# Patient Record
Sex: Male | Born: 1945 | Race: White | Hispanic: No | Marital: Married | Smoking: Former smoker
Health system: Southern US, Community
[De-identification: ages and names within clinical notes are randomized; demographics above are authoritative.]

## PROBLEM LIST (undated history)

## (undated) DIAGNOSIS — L97409 Non-pressure chronic ulcer of unspecified heel and midfoot with unspecified severity: Secondary | ICD-10-CM

## (undated) DIAGNOSIS — I509 Heart failure, unspecified: Secondary | ICD-10-CM

## (undated) DIAGNOSIS — N189 Chronic kidney disease, unspecified: Secondary | ICD-10-CM

## (undated) DIAGNOSIS — J449 Chronic obstructive pulmonary disease, unspecified: Secondary | ICD-10-CM

## (undated) DIAGNOSIS — M6281 Muscle weakness (generalized): Secondary | ICD-10-CM

## (undated) DIAGNOSIS — M109 Gout, unspecified: Secondary | ICD-10-CM

## (undated) DIAGNOSIS — G4730 Sleep apnea, unspecified: Secondary | ICD-10-CM

## (undated) DIAGNOSIS — I2510 Atherosclerotic heart disease of native coronary artery without angina pectoris: Secondary | ICD-10-CM

## (undated) DIAGNOSIS — E119 Type 2 diabetes mellitus without complications: Secondary | ICD-10-CM

## (undated) DIAGNOSIS — R339 Retention of urine, unspecified: Secondary | ICD-10-CM

## (undated) DIAGNOSIS — I890 Lymphedema, not elsewhere classified: Secondary | ICD-10-CM

## (undated) DIAGNOSIS — R262 Difficulty in walking, not elsewhere classified: Secondary | ICD-10-CM

## (undated) DIAGNOSIS — E785 Hyperlipidemia, unspecified: Secondary | ICD-10-CM

## (undated) DIAGNOSIS — F419 Anxiety disorder, unspecified: Secondary | ICD-10-CM

## (undated) DIAGNOSIS — R7881 Bacteremia: Secondary | ICD-10-CM

## (undated) DIAGNOSIS — I10 Essential (primary) hypertension: Secondary | ICD-10-CM

## (undated) DIAGNOSIS — J9610 Chronic respiratory failure, unspecified whether with hypoxia or hypercapnia: Secondary | ICD-10-CM

## (undated) DIAGNOSIS — R319 Hematuria, unspecified: Secondary | ICD-10-CM

## (undated) DIAGNOSIS — R32 Unspecified urinary incontinence: Secondary | ICD-10-CM

## (undated) DIAGNOSIS — K219 Gastro-esophageal reflux disease without esophagitis: Secondary | ICD-10-CM

## (undated) HISTORY — PX: CORONARY ARTERY BYPASS GRAFT: SHX141

## (undated) HISTORY — DX: Muscle weakness (generalized): M62.81

## (undated) HISTORY — PX: CORONARY ANGIOPLASTY WITH STENT PLACEMENT: SHX49

## (undated) HISTORY — DX: Bacteremia: R78.81

## (undated) HISTORY — DX: Unspecified urinary incontinence: R32

## (undated) HISTORY — DX: Heart failure, unspecified: I50.9

## (undated) HISTORY — DX: Chronic respiratory failure, unspecified whether with hypoxia or hypercapnia: J96.10

## (undated) HISTORY — DX: Lymphedema, not elsewhere classified: I89.0

## (undated) HISTORY — PX: APPENDECTOMY: SHX54

## (undated) HISTORY — DX: Morbid (severe) obesity due to excess calories: E66.01

## (undated) HISTORY — DX: Hyperlipidemia, unspecified: E78.5

## (undated) HISTORY — DX: Chronic obstructive pulmonary disease, unspecified: J44.9

## (undated) HISTORY — DX: Retention of urine, unspecified: R33.9

## (undated) HISTORY — DX: Hematuria, unspecified: R31.9

## (undated) HISTORY — DX: Anxiety disorder, unspecified: F41.9

## (undated) HISTORY — DX: Non-pressure chronic ulcer of unspecified heel and midfoot with unspecified severity: L97.409

## (undated) HISTORY — DX: Atherosclerotic heart disease of native coronary artery without angina pectoris: I25.10

## (undated) HISTORY — DX: Difficulty in walking, not elsewhere classified: R26.2

## (undated) HISTORY — DX: Sleep apnea, unspecified: G47.30

## (undated) HISTORY — PX: HERNIA REPAIR: SHX51

## (undated) HISTORY — DX: Gastro-esophageal reflux disease without esophagitis: K21.9

## (undated) HISTORY — DX: Essential (primary) hypertension: I10

---

## 2008-01-13 ENCOUNTER — Other Ambulatory Visit: Payer: Self-pay

## 2008-01-13 ENCOUNTER — Ambulatory Visit: Payer: Self-pay | Admitting: Ophthalmology

## 2008-01-26 ENCOUNTER — Encounter: Payer: Self-pay | Admitting: Family Medicine

## 2008-02-01 ENCOUNTER — Ambulatory Visit: Payer: Self-pay | Admitting: Ophthalmology

## 2008-02-10 ENCOUNTER — Encounter: Payer: Self-pay | Admitting: Family Medicine

## 2008-03-11 ENCOUNTER — Encounter: Payer: Self-pay | Admitting: Family Medicine

## 2008-04-11 ENCOUNTER — Encounter: Payer: Self-pay | Admitting: Family Medicine

## 2008-05-11 ENCOUNTER — Encounter: Payer: Self-pay | Admitting: Family Medicine

## 2011-06-05 ENCOUNTER — Ambulatory Visit: Payer: Self-pay | Admitting: Ophthalmology

## 2011-06-17 ENCOUNTER — Ambulatory Visit: Payer: Self-pay | Admitting: Ophthalmology

## 2011-07-16 ENCOUNTER — Encounter: Payer: Self-pay | Admitting: Family Medicine

## 2011-08-12 ENCOUNTER — Encounter: Payer: Self-pay | Admitting: Family Medicine

## 2011-11-02 ENCOUNTER — Emergency Department: Payer: Self-pay

## 2013-04-07 ENCOUNTER — Ambulatory Visit: Payer: Self-pay | Admitting: Podiatry

## 2014-08-30 ENCOUNTER — Inpatient Hospital Stay: Payer: Self-pay | Admitting: Internal Medicine

## 2014-08-30 DIAGNOSIS — I10 Essential (primary) hypertension: Secondary | ICD-10-CM

## 2014-08-30 DIAGNOSIS — I2510 Atherosclerotic heart disease of native coronary artery without angina pectoris: Secondary | ICD-10-CM

## 2014-08-30 DIAGNOSIS — J9600 Acute respiratory failure, unspecified whether with hypoxia or hypercapnia: Secondary | ICD-10-CM

## 2014-08-30 DIAGNOSIS — I341 Nonrheumatic mitral (valve) prolapse: Secondary | ICD-10-CM

## 2014-08-30 LAB — URINALYSIS, COMPLETE
Bilirubin,UR: NEGATIVE
Glucose,UR: NEGATIVE mg/dL (ref 0–75)
KETONE: NEGATIVE
Leukocyte Esterase: NEGATIVE
NITRITE: NEGATIVE
Ph: 6 (ref 4.5–8.0)
Protein: 30
Specific Gravity: 1.015 (ref 1.003–1.030)
WBC UR: NONE SEEN /HPF (ref 0–5)

## 2014-08-30 LAB — ED INFLUENZA
H1N1 flu by pcr: NOT DETECTED
Influenza A By PCR: NEGATIVE
Influenza B By PCR: NEGATIVE

## 2014-08-30 LAB — CBC
HCT: 42.2 % (ref 40.0–52.0)
HGB: 13 g/dL (ref 13.0–18.0)
MCH: 25.7 pg — AB (ref 26.0–34.0)
MCV: 83 fL (ref 80–100)
Platelet: 124 10*3/uL — ABNORMAL LOW (ref 150–440)
RBC: 5.07 10*6/uL (ref 4.40–5.90)
RDW: 17.4 % — AB (ref 11.5–14.5)
WBC: 13.1 10*3/uL — AB (ref 3.8–10.6)

## 2014-08-30 LAB — BASIC METABOLIC PANEL
ANION GAP: 9 (ref 7–16)
BUN: 35 mg/dL — AB (ref 7–18)
CHLORIDE: 104 mmol/L (ref 98–107)
Calcium, Total: 8.5 mg/dL (ref 8.5–10.1)
Co2: 28 mmol/L (ref 21–32)
Creatinine: 1.93 mg/dL — ABNORMAL HIGH (ref 0.60–1.30)
EGFR (African American): 45 — ABNORMAL LOW
EGFR (Non-African Amer.): 37 — ABNORMAL LOW
Glucose: 179 mg/dL — ABNORMAL HIGH (ref 65–99)
OSMOLALITY: 294 (ref 275–301)
Potassium: 4.8 mmol/L (ref 3.5–5.1)
Sodium: 141 mmol/L (ref 136–145)

## 2014-08-30 LAB — CK TOTAL AND CKMB (NOT AT ARMC)
CK, TOTAL: 78 U/L
CK, Total: 54 U/L
CK-MB: 1 ng/mL (ref 0.5–3.6)
CK-MB: 0.9 ng/mL (ref 0.5–3.6)

## 2014-08-30 LAB — TROPONIN I
Troponin-I: 0.03 ng/mL
Troponin-I: 0.13 ng/mL — ABNORMAL HIGH

## 2014-08-30 LAB — PRO B NATRIURETIC PEPTIDE: B-TYPE NATIURETIC PEPTID: 2540 pg/mL — AB (ref 0–125)

## 2014-08-31 LAB — BASIC METABOLIC PANEL
ANION GAP: 3 — AB (ref 7–16)
Anion Gap: 7 (ref 7–16)
BUN: 44 mg/dL — ABNORMAL HIGH (ref 7–18)
BUN: 48 mg/dL — ABNORMAL HIGH (ref 7–18)
CALCIUM: 8.3 mg/dL — AB (ref 8.5–10.1)
CALCIUM: 8.1 mg/dL — AB (ref 8.5–10.1)
CHLORIDE: 103 mmol/L (ref 98–107)
CO2: 33 mmol/L — AB (ref 21–32)
CREATININE: 2.51 mg/dL — AB (ref 0.60–1.30)
Chloride: 100 mmol/L (ref 98–107)
Co2: 32 mmol/L (ref 21–32)
Creatinine: 2.58 mg/dL — ABNORMAL HIGH (ref 0.60–1.30)
EGFR (African American): 33 — ABNORMAL LOW
EGFR (Non-African Amer.): 27 — ABNORMAL LOW
GFR CALC AF AMER: 32 — AB
GFR CALC NON AF AMER: 26 — AB
Glucose: 124 mg/dL — ABNORMAL HIGH (ref 65–99)
Glucose: 143 mg/dL — ABNORMAL HIGH (ref 65–99)
Osmolality: 290 (ref 275–301)
Osmolality: 293 (ref 275–301)
POTASSIUM: 5.3 mmol/L — AB (ref 3.5–5.1)
POTASSIUM: 5.2 mmol/L — AB (ref 3.5–5.1)
Sodium: 139 mmol/L (ref 136–145)
Sodium: 139 mmol/L (ref 136–145)

## 2014-09-01 LAB — BASIC METABOLIC PANEL
Anion Gap: 5 — ABNORMAL LOW (ref 7–16)
BUN: 58 mg/dL — ABNORMAL HIGH (ref 7–18)
Calcium, Total: 8.3 mg/dL — ABNORMAL LOW (ref 8.5–10.1)
Chloride: 99 mmol/L (ref 98–107)
Co2: 34 mmol/L — ABNORMAL HIGH (ref 21–32)
Creatinine: 2.97 mg/dL — ABNORMAL HIGH (ref 0.60–1.30)
EGFR (African American): 27 — ABNORMAL LOW
EGFR (Non-African Amer.): 22 — ABNORMAL LOW
GLUCOSE: 101 mg/dL — AB (ref 65–99)
OSMOLALITY: 292 (ref 275–301)
Potassium: 5.1 mmol/L (ref 3.5–5.1)
Sodium: 138 mmol/L (ref 136–145)

## 2014-09-02 LAB — BASIC METABOLIC PANEL
ANION GAP: 6 — AB (ref 7–16)
BUN: 56 mg/dL — AB (ref 7–18)
CO2: 30 mmol/L (ref 21–32)
Calcium, Total: 8.5 mg/dL (ref 8.5–10.1)
Chloride: 100 mmol/L (ref 98–107)
Creatinine: 2.3 mg/dL — ABNORMAL HIGH (ref 0.60–1.30)
EGFR (African American): 36 — ABNORMAL LOW
EGFR (Non-African Amer.): 30 — ABNORMAL LOW
Glucose: 112 mg/dL — ABNORMAL HIGH (ref 65–99)
Osmolality: 288 (ref 275–301)
POTASSIUM: 4.7 mmol/L (ref 3.5–5.1)
Sodium: 136 mmol/L (ref 136–145)

## 2014-09-04 LAB — BASIC METABOLIC PANEL
ANION GAP: 8 (ref 7–16)
BUN: 51 mg/dL — ABNORMAL HIGH (ref 7–18)
CALCIUM: 9.2 mg/dL (ref 8.5–10.1)
CREATININE: 1.56 mg/dL — AB (ref 0.60–1.30)
Chloride: 103 mmol/L (ref 98–107)
Co2: 31 mmol/L (ref 21–32)
EGFR (African American): 57 — ABNORMAL LOW
GFR CALC NON AF AMER: 47 — AB
Glucose: 258 mg/dL — ABNORMAL HIGH (ref 65–99)
Osmolality: 306 (ref 275–301)
Potassium: 4.5 mmol/L (ref 3.5–5.1)
SODIUM: 142 mmol/L (ref 136–145)

## 2014-09-04 LAB — CLOSTRIDIUM DIFFICILE(ARMC)

## 2014-09-04 LAB — CULTURE, BLOOD (SINGLE)

## 2014-09-05 LAB — BASIC METABOLIC PANEL
Anion Gap: 5 — ABNORMAL LOW (ref 7–16)
BUN: 51 mg/dL — AB (ref 7–18)
Calcium, Total: 9.1 mg/dL (ref 8.5–10.1)
Chloride: 101 mmol/L (ref 98–107)
Co2: 34 mmol/L — ABNORMAL HIGH (ref 21–32)
Creatinine: 1.61 mg/dL — ABNORMAL HIGH (ref 0.60–1.30)
EGFR (African American): 55 — ABNORMAL LOW
GFR CALC NON AF AMER: 45 — AB
GLUCOSE: 211 mg/dL — AB (ref 65–99)
Osmolality: 299 (ref 275–301)
Potassium: 4.6 mmol/L (ref 3.5–5.1)
Sodium: 140 mmol/L (ref 136–145)

## 2014-09-06 LAB — CULTURE, BLOOD (SINGLE)

## 2014-10-11 ENCOUNTER — Emergency Department: Payer: Self-pay | Admitting: Internal Medicine

## 2014-10-11 LAB — COMPREHENSIVE METABOLIC PANEL
ALT: 10 U/L — AB
Albumin: 3.2 g/dL — ABNORMAL LOW (ref 3.4–5.0)
Alkaline Phosphatase: 76 U/L
Anion Gap: 4 — ABNORMAL LOW (ref 7–16)
BUN: 38 mg/dL — ABNORMAL HIGH (ref 7–18)
Bilirubin,Total: 0.4 mg/dL (ref 0.2–1.0)
Calcium, Total: 8.8 mg/dL (ref 8.5–10.1)
Chloride: 103 mmol/L (ref 98–107)
Co2: 33 mmol/L — ABNORMAL HIGH (ref 21–32)
Creatinine: 1.99 mg/dL — ABNORMAL HIGH (ref 0.60–1.30)
GFR CALC AF AMER: 43 — AB
GFR CALC NON AF AMER: 36 — AB
Glucose: 60 mg/dL — ABNORMAL LOW (ref 65–99)
OSMOLALITY: 286 (ref 275–301)
Potassium: 4.7 mmol/L (ref 3.5–5.1)
SGOT(AST): 29 U/L (ref 15–37)
SODIUM: 140 mmol/L (ref 136–145)
TOTAL PROTEIN: 6.4 g/dL (ref 6.4–8.2)

## 2014-10-11 LAB — CK TOTAL AND CKMB (NOT AT ARMC)
CK, TOTAL: 35 U/L — AB (ref 39–308)
CK-MB: 1.7 ng/mL (ref 0.5–3.6)

## 2014-10-11 LAB — PROTIME-INR
INR: 1.2
PROTHROMBIN TIME: 14.6 s (ref 11.5–14.7)

## 2014-10-11 LAB — TROPONIN I: Troponin-I: 0.02 ng/mL

## 2014-10-11 LAB — CBC
HCT: 38.7 % — ABNORMAL LOW (ref 40.0–52.0)
HGB: 12.1 g/dL — AB (ref 13.0–18.0)
MCH: 26.9 pg (ref 26.0–34.0)
MCHC: 31.4 g/dL — AB (ref 32.0–36.0)
MCV: 86 fL (ref 80–100)
Platelet: 131 10*3/uL — ABNORMAL LOW (ref 150–440)
RBC: 4.51 10*6/uL (ref 4.40–5.90)
RDW: 19.5 % — AB (ref 11.5–14.5)
WBC: 5.5 10*3/uL (ref 3.8–10.6)

## 2014-10-11 LAB — AMMONIA: AMMONIA, PLASMA: 44 umol/L — AB (ref 11–32)

## 2014-10-15 ENCOUNTER — Emergency Department: Payer: Self-pay | Admitting: Student

## 2014-10-15 LAB — COMPREHENSIVE METABOLIC PANEL
ALBUMIN: 3.2 g/dL — AB (ref 3.4–5.0)
ALT: 22 U/L
ANION GAP: 10 (ref 7–16)
Alkaline Phosphatase: 81 U/L
BILIRUBIN TOTAL: 0.8 mg/dL (ref 0.2–1.0)
BUN: 41 mg/dL — ABNORMAL HIGH (ref 7–18)
CALCIUM: 8.7 mg/dL (ref 8.5–10.1)
CO2: 32 mmol/L (ref 21–32)
Chloride: 99 mmol/L (ref 98–107)
Creatinine: 2.15 mg/dL — ABNORMAL HIGH (ref 0.60–1.30)
EGFR (African American): 39 — ABNORMAL LOW
EGFR (Non-African Amer.): 33 — ABNORMAL LOW
GLUCOSE: 96 mg/dL (ref 65–99)
OSMOLALITY: 291 (ref 275–301)
POTASSIUM: 4.2 mmol/L (ref 3.5–5.1)
SGOT(AST): 21 U/L (ref 15–37)
SODIUM: 141 mmol/L (ref 136–145)
Total Protein: 6.9 g/dL (ref 6.4–8.2)

## 2014-10-15 LAB — CBC
HCT: 36.6 % — ABNORMAL LOW (ref 40.0–52.0)
HGB: 11.6 g/dL — ABNORMAL LOW (ref 13.0–18.0)
MCH: 27.2 pg (ref 26.0–34.0)
MCHC: 31.8 g/dL — AB (ref 32.0–36.0)
MCV: 86 fL (ref 80–100)
Platelet: 123 10*3/uL — ABNORMAL LOW (ref 150–440)
RBC: 4.27 10*6/uL — ABNORMAL LOW (ref 4.40–5.90)
RDW: 19.9 % — ABNORMAL HIGH (ref 11.5–14.5)
WBC: 10.2 10*3/uL (ref 3.8–10.6)

## 2014-10-15 LAB — TROPONIN I: TROPONIN-I: 0.02 ng/mL

## 2014-10-15 LAB — URINALYSIS, COMPLETE
Bilirubin,UR: NEGATIVE
GLUCOSE, UR: NEGATIVE mg/dL (ref 0–75)
Hyaline Cast: 9
KETONE: NEGATIVE
NITRITE: NEGATIVE
Ph: 5 (ref 4.5–8.0)
Protein: 30
RBC,UR: 22 /HPF (ref 0–5)
Specific Gravity: 1.015 (ref 1.003–1.030)
WBC UR: 54 /HPF (ref 0–5)

## 2014-10-15 LAB — AMMONIA: Ammonia, Plasma: 19 mcmol/L (ref 11–32)

## 2014-10-18 LAB — URINE CULTURE

## 2014-11-11 ENCOUNTER — Ambulatory Visit: Payer: Self-pay | Admitting: Hospice and Palliative Medicine

## 2014-11-12 ENCOUNTER — Ambulatory Visit: Payer: Self-pay

## 2014-11-12 LAB — URINALYSIS, COMPLETE
Bilirubin,UR: NEGATIVE
Glucose,UR: NEGATIVE mg/dL
Hyaline Cast: 42
Ketone: NEGATIVE
Nitrite: NEGATIVE
Ph: 5
Protein: 30
RBC,UR: 15 /HPF
Specific Gravity: 1.01
Squamous Epithelial: 1
WBC UR: 928 /HPF

## 2014-11-13 ENCOUNTER — Inpatient Hospital Stay: Payer: Self-pay | Admitting: Internal Medicine

## 2014-11-13 LAB — CBC WITH DIFFERENTIAL/PLATELET
BASOS ABS: 0.1 10*3/uL (ref 0.0–0.1)
BASOS PCT: 0.8 %
Eosinophil #: 0 10*3/uL (ref 0.0–0.7)
Eosinophil %: 0 %
HCT: 36.9 % — AB (ref 40.0–52.0)
HGB: 11.8 g/dL — AB (ref 13.0–18.0)
Lymphocyte #: 0.1 10*3/uL — ABNORMAL LOW (ref 1.0–3.6)
Lymphocyte %: 1.6 %
MCH: 28.2 pg (ref 26.0–34.0)
MCHC: 31.9 g/dL — ABNORMAL LOW (ref 32.0–36.0)
MCV: 88 fL (ref 80–100)
MONOS PCT: 4.1 %
Monocyte #: 0.3 x10 3/mm (ref 0.2–1.0)
NEUTROS ABS: 7.8 10*3/uL — AB (ref 1.4–6.5)
Neutrophil %: 93.5 %
Platelet: 111 10*3/uL — ABNORMAL LOW (ref 150–440)
RBC: 4.18 10*6/uL — ABNORMAL LOW (ref 4.40–5.90)
RDW: 17.9 % — AB (ref 11.5–14.5)
WBC: 8.4 10*3/uL (ref 3.8–10.6)

## 2014-11-13 LAB — COMPREHENSIVE METABOLIC PANEL
ALBUMIN: 3.1 g/dL — AB (ref 3.4–5.0)
AST: 24 U/L (ref 15–37)
Alkaline Phosphatase: 75 U/L
Anion Gap: 8 (ref 7–16)
BUN: 66 mg/dL — ABNORMAL HIGH (ref 7–18)
Bilirubin,Total: 1.2 mg/dL — ABNORMAL HIGH (ref 0.2–1.0)
Calcium, Total: 9.2 mg/dL (ref 8.5–10.1)
Chloride: 103 mmol/L (ref 98–107)
Co2: 33 mmol/L — ABNORMAL HIGH (ref 21–32)
Creatinine: 2.7 mg/dL — ABNORMAL HIGH (ref 0.60–1.30)
EGFR (Non-African Amer.): 25 — ABNORMAL LOW
GFR CALC AF AMER: 30 — AB
GLUCOSE: 138 mg/dL — AB (ref 65–99)
OSMOLALITY: 308 (ref 275–301)
POTASSIUM: 3.6 mmol/L (ref 3.5–5.1)
SGPT (ALT): 15 U/L
Sodium: 144 mmol/L (ref 136–145)
Total Protein: 6.5 g/dL (ref 6.4–8.2)

## 2014-11-13 LAB — TROPONIN I
Troponin-I: 0.09 ng/mL — ABNORMAL HIGH
Troponin-I: 0.09 ng/mL — ABNORMAL HIGH
Troponin-I: 0.1 ng/mL — ABNORMAL HIGH

## 2014-11-13 LAB — MAGNESIUM: MAGNESIUM: 1.6 mg/dL — AB

## 2014-11-13 LAB — PROTIME-INR
INR: 1.2
PROTHROMBIN TIME: 14.9 s — AB (ref 11.5–14.7)

## 2014-11-13 LAB — PHOSPHORUS: PHOSPHORUS: 2.4 mg/dL — AB (ref 2.5–4.9)

## 2014-11-14 LAB — CBC WITH DIFFERENTIAL/PLATELET
BASOS PCT: 0.4 %
Basophil #: 0 10*3/uL (ref 0.0–0.1)
EOS PCT: 0.6 %
Eosinophil #: 0 10*3/uL (ref 0.0–0.7)
HCT: 32.9 % — AB (ref 40.0–52.0)
HGB: 10.6 g/dL — AB (ref 13.0–18.0)
LYMPHS PCT: 11.4 %
Lymphocyte #: 0.4 10*3/uL — ABNORMAL LOW (ref 1.0–3.6)
MCH: 28.4 pg (ref 26.0–34.0)
MCHC: 32.2 g/dL (ref 32.0–36.0)
MCV: 88 fL (ref 80–100)
Monocyte #: 0.4 x10 3/mm (ref 0.2–1.0)
Monocyte %: 10.5 %
NEUTROS ABS: 2.9 10*3/uL (ref 1.4–6.5)
Neutrophil %: 77.1 %
Platelet: 84 10*3/uL — ABNORMAL LOW (ref 150–440)
RBC: 3.74 10*6/uL — AB (ref 4.40–5.90)
RDW: 17.7 % — ABNORMAL HIGH (ref 11.5–14.5)
WBC: 3.8 10*3/uL (ref 3.8–10.6)

## 2014-11-14 LAB — BASIC METABOLIC PANEL
Anion Gap: 7 (ref 7–16)
BUN: 59 mg/dL — ABNORMAL HIGH (ref 7–18)
CALCIUM: 8.6 mg/dL (ref 8.5–10.1)
CO2: 33 mmol/L — AB (ref 21–32)
Chloride: 106 mmol/L (ref 98–107)
Creatinine: 2.26 mg/dL — ABNORMAL HIGH (ref 0.60–1.30)
EGFR (Non-African Amer.): 31 — ABNORMAL LOW
GFR CALC AF AMER: 37 — AB
GLUCOSE: 118 mg/dL — AB (ref 65–99)
OSMOLALITY: 308 (ref 275–301)
Potassium: 3.6 mmol/L (ref 3.5–5.1)
SODIUM: 146 mmol/L — AB (ref 136–145)

## 2014-11-14 LAB — URINE CULTURE

## 2014-11-14 LAB — MAGNESIUM: Magnesium: 1.9 mg/dL

## 2014-11-15 LAB — CBC WITH DIFFERENTIAL/PLATELET
BASOS PCT: 0.7 %
Basophil #: 0 10*3/uL (ref 0.0–0.1)
EOS PCT: 3.2 %
Eosinophil #: 0.1 10*3/uL (ref 0.0–0.7)
HCT: 30.5 % — ABNORMAL LOW (ref 40.0–52.0)
HGB: 10 g/dL — AB (ref 13.0–18.0)
Lymphocyte #: 0.5 10*3/uL — ABNORMAL LOW (ref 1.0–3.6)
Lymphocyte %: 17 %
MCH: 28.3 pg (ref 26.0–34.0)
MCHC: 32.8 g/dL (ref 32.0–36.0)
MCV: 86 fL (ref 80–100)
MONO ABS: 0.4 x10 3/mm (ref 0.2–1.0)
Monocyte %: 11.6 %
NEUTROS PCT: 67.5 %
Neutrophil #: 2.1 10*3/uL (ref 1.4–6.5)
Platelet: 88 10*3/uL — ABNORMAL LOW (ref 150–440)
RBC: 3.55 10*6/uL — AB (ref 4.40–5.90)
RDW: 17.6 % — ABNORMAL HIGH (ref 11.5–14.5)
WBC: 3.1 10*3/uL — AB (ref 3.8–10.6)

## 2014-11-15 LAB — BASIC METABOLIC PANEL
Anion Gap: 6 — ABNORMAL LOW (ref 7–16)
BUN: 45 mg/dL — AB (ref 7–18)
CHLORIDE: 106 mmol/L (ref 98–107)
CREATININE: 1.82 mg/dL — AB (ref 0.60–1.30)
Calcium, Total: 8.3 mg/dL — ABNORMAL LOW (ref 8.5–10.1)
Co2: 34 mmol/L — ABNORMAL HIGH (ref 21–32)
EGFR (African American): 48 — ABNORMAL LOW
GFR CALC NON AF AMER: 39 — AB
GLUCOSE: 105 mg/dL — AB (ref 65–99)
OSMOLALITY: 302 (ref 275–301)
POTASSIUM: 3.3 mmol/L — AB (ref 3.5–5.1)
Sodium: 146 mmol/L — ABNORMAL HIGH (ref 136–145)

## 2014-11-16 LAB — BASIC METABOLIC PANEL
Anion Gap: 4 — ABNORMAL LOW (ref 7–16)
BUN: 30 mg/dL — AB (ref 7–18)
Calcium, Total: 8.5 mg/dL (ref 8.5–10.1)
Chloride: 106 mmol/L (ref 98–107)
Co2: 36 mmol/L — ABNORMAL HIGH (ref 21–32)
Creatinine: 1.36 mg/dL — ABNORMAL HIGH (ref 0.60–1.30)
GFR CALC NON AF AMER: 55 — AB
Glucose: 138 mg/dL — ABNORMAL HIGH (ref 65–99)
Osmolality: 299 (ref 275–301)
POTASSIUM: 3.9 mmol/L (ref 3.5–5.1)
Sodium: 146 mmol/L — ABNORMAL HIGH (ref 136–145)

## 2014-11-17 LAB — CLOSTRIDIUM DIFFICILE(ARMC)

## 2014-11-17 LAB — CULTURE, BLOOD (SINGLE)

## 2014-11-23 ENCOUNTER — Inpatient Hospital Stay: Payer: Self-pay | Admitting: Internal Medicine

## 2014-11-23 LAB — URINALYSIS, COMPLETE
BILIRUBIN, UR: NEGATIVE
Bacteria: NONE SEEN
Glucose,UR: NEGATIVE mg/dL (ref 0–75)
KETONE: NEGATIVE
LEUKOCYTE ESTERASE: NEGATIVE
Nitrite: NEGATIVE
PROTEIN: NEGATIVE
Ph: 5 (ref 4.5–8.0)
Specific Gravity: 1.01 (ref 1.003–1.030)
Squamous Epithelial: 1
WBC UR: 1 /HPF (ref 0–5)

## 2014-11-23 LAB — CBC WITH DIFFERENTIAL/PLATELET
Basophil #: 0.1 10*3/uL (ref 0.0–0.1)
Basophil %: 0.3 %
EOS PCT: 0.1 %
Eosinophil #: 0 10*3/uL (ref 0.0–0.7)
HCT: 34.5 % — AB (ref 40.0–52.0)
HGB: 11.2 g/dL — AB (ref 13.0–18.0)
LYMPHS ABS: 0.5 10*3/uL — AB (ref 1.0–3.6)
Lymphocyte %: 2.5 %
MCH: 28.3 pg (ref 26.0–34.0)
MCHC: 32.5 g/dL (ref 32.0–36.0)
MCV: 87 fL (ref 80–100)
MONOS PCT: 3 %
Monocyte #: 0.6 x10 3/mm (ref 0.2–1.0)
NEUTROS ABS: 17.4 10*3/uL — AB (ref 1.4–6.5)
Neutrophil %: 94.1 %
PLATELETS: 161 10*3/uL (ref 150–440)
RBC: 3.96 10*6/uL — ABNORMAL LOW (ref 4.40–5.90)
RDW: 17.5 % — ABNORMAL HIGH (ref 11.5–14.5)
WBC: 18.5 10*3/uL — ABNORMAL HIGH (ref 3.8–10.6)

## 2014-11-23 LAB — COMPREHENSIVE METABOLIC PANEL
Albumin: 2.5 g/dL — ABNORMAL LOW (ref 3.4–5.0)
Alkaline Phosphatase: 72 U/L
Anion Gap: 7 (ref 7–16)
BUN: 27 mg/dL — AB (ref 7–18)
Bilirubin,Total: 0.8 mg/dL (ref 0.2–1.0)
CREATININE: 2.01 mg/dL — AB (ref 0.60–1.30)
Calcium, Total: 8.3 mg/dL — ABNORMAL LOW (ref 8.5–10.1)
Chloride: 100 mmol/L (ref 98–107)
Co2: 33 mmol/L — ABNORMAL HIGH (ref 21–32)
GFR CALC AF AMER: 43 — AB
GFR CALC NON AF AMER: 35 — AB
GLUCOSE: 112 mg/dL — AB (ref 65–99)
Osmolality: 285 (ref 275–301)
Potassium: 3.8 mmol/L (ref 3.5–5.1)
SGOT(AST): 25 U/L (ref 15–37)
SGPT (ALT): 16 U/L
Sodium: 140 mmol/L (ref 136–145)
Total Protein: 5.5 g/dL — ABNORMAL LOW (ref 6.4–8.2)

## 2014-11-23 LAB — TROPONIN I
TROPONIN-I: 0.04 ng/mL
Troponin-I: 0.04 ng/mL

## 2014-11-23 LAB — MAGNESIUM: Magnesium: 1.6 mg/dL — ABNORMAL LOW

## 2014-11-23 LAB — PHOSPHORUS: PHOSPHORUS: 3.1 mg/dL (ref 2.5–4.9)

## 2014-11-23 LAB — PROTIME-INR
INR: 1.2
Prothrombin Time: 14.9 secs — ABNORMAL HIGH (ref 11.5–14.7)

## 2014-11-23 LAB — CK: CK, TOTAL: 26 U/L — AB (ref 39–308)

## 2014-11-24 LAB — CK: CK, TOTAL: 18 U/L — AB (ref 39–308)

## 2014-11-24 LAB — BASIC METABOLIC PANEL
Anion Gap: 5 — ABNORMAL LOW (ref 7–16)
BUN: 24 mg/dL — AB (ref 7–18)
CALCIUM: 8.1 mg/dL — AB (ref 8.5–10.1)
Chloride: 104 mmol/L (ref 98–107)
Co2: 33 mmol/L — ABNORMAL HIGH (ref 21–32)
Creatinine: 1.78 mg/dL — ABNORMAL HIGH (ref 0.60–1.30)
EGFR (African American): 49 — ABNORMAL LOW
GFR CALC NON AF AMER: 40 — AB
GLUCOSE: 129 mg/dL — AB (ref 65–99)
OSMOLALITY: 289 (ref 275–301)
Potassium: 3.7 mmol/L (ref 3.5–5.1)
Sodium: 142 mmol/L (ref 136–145)

## 2014-11-24 LAB — CBC WITH DIFFERENTIAL/PLATELET
BASOS PCT: 1.2 %
Basophil #: 0 10*3/uL (ref 0.0–0.1)
Basophil #: 0.1 10*3/uL (ref 0.0–0.1)
Basophil %: 0.3 %
EOS ABS: 0 10*3/uL (ref 0.0–0.7)
Eosinophil #: 0.1 10*3/uL (ref 0.0–0.7)
Eosinophil %: 0.4 %
Eosinophil %: 0.7 %
HCT: 27.3 % — AB (ref 40.0–52.0)
HCT: 27.7 % — AB (ref 40.0–52.0)
HGB: 8.8 g/dL — ABNORMAL LOW (ref 13.0–18.0)
HGB: 9 g/dL — ABNORMAL LOW (ref 13.0–18.0)
LYMPHS ABS: 0.4 10*3/uL — AB (ref 1.0–3.6)
LYMPHS PCT: 6.6 %
LYMPHS PCT: 3.8 %
Lymphocyte #: 0.5 10*3/uL — ABNORMAL LOW (ref 1.0–3.6)
MCH: 28.6 pg (ref 26.0–34.0)
MCH: 28.8 pg (ref 26.0–34.0)
MCHC: 32.2 g/dL (ref 32.0–36.0)
MCHC: 32.3 g/dL (ref 32.0–36.0)
MCV: 89 fL (ref 80–100)
MCV: 89 fL (ref 80–100)
MONO ABS: 0.3 x10 3/mm (ref 0.2–1.0)
Monocyte #: 0.3 x10 3/mm (ref 0.2–1.0)
Monocyte %: 3.1 %
Monocyte %: 4 %
NEUTROS ABS: 6.8 10*3/uL — AB (ref 1.4–6.5)
NEUTROS PCT: 91.5 %
Neutrophil #: 9.9 10*3/uL — ABNORMAL HIGH (ref 1.4–6.5)
Neutrophil %: 88.4 %
PLATELETS: 104 10*3/uL — AB (ref 150–440)
Platelet: 116 10*3/uL — ABNORMAL LOW (ref 150–440)
RBC: 3.07 10*6/uL — ABNORMAL LOW (ref 4.40–5.90)
RBC: 3.11 10*6/uL — ABNORMAL LOW (ref 4.40–5.90)
RDW: 17.1 % — AB (ref 11.5–14.5)
RDW: 17.5 % — ABNORMAL HIGH (ref 11.5–14.5)
WBC: 7.7 10*3/uL (ref 3.8–10.6)
WBC: 10.8 10*3/uL — AB (ref 3.8–10.6)

## 2014-11-24 LAB — TROPONIN I: Troponin-I: 0.04 ng/mL

## 2014-11-25 LAB — URINE CULTURE

## 2014-11-25 LAB — VANCOMYCIN, RANDOM: VANCOMYCIN, RANDOM: 23 ug/mL

## 2014-11-25 LAB — MAGNESIUM: MAGNESIUM: 1.7 mg/dL — AB

## 2014-11-26 LAB — BASIC METABOLIC PANEL
Anion Gap: 5 — ABNORMAL LOW (ref 7–16)
BUN: 13 mg/dL (ref 7–18)
CALCIUM: 8.6 mg/dL (ref 8.5–10.1)
CO2: 33 mmol/L — AB (ref 21–32)
Chloride: 106 mmol/L (ref 98–107)
Creatinine: 1.08 mg/dL (ref 0.60–1.30)
EGFR (African American): >60
EGFR (Non-African Amer.): >60
GLUCOSE: 108 mg/dL — AB (ref 65–99)
OSMOLALITY: 287 (ref 275–301)
POTASSIUM: 3.8 mmol/L (ref 3.5–5.1)
Sodium: 144 mmol/L (ref 136–145)

## 2014-11-26 LAB — CBC WITH DIFFERENTIAL/PLATELET
BASOS ABS: 0 10*3/uL (ref 0.0–0.1)
Basophil %: 0.8 %
EOS ABS: 0.1 10*3/uL (ref 0.0–0.7)
Eosinophil %: 2.3 %
HCT: 29.3 % — AB (ref 40.0–52.0)
HGB: 9.4 g/dL — ABNORMAL LOW (ref 13.0–18.0)
LYMPHS PCT: 12.4 %
Lymphocyte #: 0.6 10*3/uL — ABNORMAL LOW (ref 1.0–3.6)
MCH: 28.9 pg (ref 26.0–34.0)
MCHC: 32.1 g/dL (ref 32.0–36.0)
MCV: 90 fL (ref 80–100)
MONO ABS: 0.3 x10 3/mm (ref 0.2–1.0)
Monocyte %: 5.2 %
NEUTROS ABS: 4.1 10*3/uL (ref 1.4–6.5)
Neutrophil %: 79.3 %
Platelet: 116 10*3/uL — ABNORMAL LOW (ref 150–440)
RBC: 3.26 10*6/uL — ABNORMAL LOW (ref 4.40–5.90)
RDW: 17 % — ABNORMAL HIGH (ref 11.5–14.5)
WBC: 5.2 10*3/uL (ref 3.8–10.6)

## 2014-11-28 LAB — CULTURE, BLOOD (SINGLE)

## 2014-12-12 ENCOUNTER — Ambulatory Visit: Payer: Self-pay | Admitting: Hospice and Palliative Medicine

## 2015-01-02 ENCOUNTER — Ambulatory Visit: Payer: Self-pay | Admitting: Urology

## 2015-03-04 NOTE — H&P (Signed)
PATIENT NAME:  [name], [name] MR#:  [id number] DATE OF BIRTH:  [birth year]  DATE OF ADMISSION:  08/30/2014  PR[facility] C[facility] PH[facility]IC[facility] At [facility] [place].   [facility] [place].   REFERRING EMERGENCY ROOM PHYSICIAN: Lurena JoinerRebecca L. Lord, MD  CHIEF COMPLAINT: Shortness of breath and fever.   HISTORY OF PRESENT ILLNESS: A 69 year old male with past history of hypertension, diabetes, coronary artery disease and CABG with sleep apnea and CPAP requirement at night and morbid obesity, and chronic oxygen use at home saying that until yesterday he was feeling fine. Yesterday evening he started feeling some shortness of breath and he had some chills and in the night he woke up with severe chills and feeling cold and was feeling extremely short of breath, so came to Emergency Room. In the ER, he was noted to be hypoxic so workup for that was done which showed some congestion on chest x-ray according to me and was started on BiPAP for respiratory support. He also noted to be having some fever and elevated white cell count. He had bilateral lower extremity chronic swelling and pain and lymphedema and it appeared a little red, so given to hospitalist service for further management of his respiratory failure and fever.   REVIEW OF SYSTEMS: CONSTITUTIONAL: Negative for fever. Positive of fatigue, generalized weakness and chills. No weight gain or weight loss.  EYES: No blurring, double vision, discharge or redness.  EARS, NOSE, THROAT: No tinnitus, ear pain or hearing loss.  RESPIRATORY: The patient had some shortness of breath, but denied any cough or chest pain.  CARDIOVASCULAR: No palpitations, but had chronic edema on the legs. No chest pain.  GASTROINTESTINAL: No nausea, vomiting, diarrhea, abdominal pain.  GENITOURINARY: No dysuria, hematuria, increased frequency.  ENDOCRINE: No heat or cold intolerance. No excessive sweating.  NEUROLOGICAL: No numbness, weakness, tremor or vertigo.   PSYCHIATRIC: No anxiety, insomnia, bipolar disorder.   PAST MEDICAL HISTORY: 1.  Hypertension.  2.  Diabetes mellitus.   3.  Coronary artery disease and status post CABG.  4.  Hyperlipidemia. 5.  Obesity hypoventilation syndrome and chronic sleep apnea. Uses CPAP at night and oxygen throughout the day.  6.  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   PAST SURGICAL HISTORY: CABG.   SOCIAL HISTORY: Lives with his wife who is wheelchair-bound and disabled. He was a smoker in the very remote past, but was not a very heavy habitual smoker. Denies alcohol or illegal drug use.   FAMILY HISTORY: Positive for diabetes in multiple family members and leukemia in his brother, coronary artery disease in his mother.   HOME MEDICATIONS:  1.  Simvastatin 40 mg oral once a day.  2.  Sertraline 100 mg oral 2 tablets once a day.  3.  Plavix 75 mg oral once a day.  4.  Oxycodone 5 mg oral 2 tablets 3 times a day.  5.  Omeprazole 20 mg oral 1 tablet 2 times a day.  6.  Novolin 70/30 with 40 units subcutaneous once in the morning and 15 units subcutaneous at bedtime.  7.  Nitrostat 0.4 mg sublingual tablet every 5 minutes as needed for chest pain.  8.  Metoprolol 100 mg oral tablet once a day.  9.  Isosorbide mononitrate 60 mg oral once a day.  10.   Gabapentin 800 mg oral 2 times a day.  11.   Furosemide 40 mg oral 2 times a day.  12.   Fluconazole 150 mg oral once a week.  13.   Budesonide 0.5 mg per 2 mL inhalation suspension, take 2 mL inhalation 2 times a day.  14.   Aspirin 81 mg once a day.  15.   Allopurinol 100 mL oral once a day.   PHYSICAL EXAMINATION: VITAL SIGNS: In ER, temperature 101.5, pulse 81, respiration was 24 to 26, and blood pressure was 146/53, pulse oximetry was 75% on room air and then came up to 100% with BiPAP.  GENERAL: The patient is morbidly obese and appears to be in respiratory distress requiring BiPAP, but he is fully alert and oriented to time, place, and person and  cooperative.  HEENT: Head and neck atraumatic. Conjunctivae pink. Oral mucosa moist. BiPAP in use.  NECK: Supple. No JVD.  RESPIRATORY: Bilateral some crepitation present and equal.  CARDIOVASCULAR: S1, S2 present, regular. No murmur.  ABDOMEN: Morbidly obese, soft, nontender, bowel sounds present. No organomegaly.  SKIN: Legs, bilateral edematous and redness present with some warmness to touch.  JOINTS: No swelling or tenderness.  NEUROLOGICAL: Power 4/5 generalized weakness. Moves all 4 limbs. No tremor or rigidity. Follows commands.  PSYCHIATRIC: No anxiety, insomnia, bipolar disorder at this time.   IMPORTANT LABORATORY RESULTS: BNP 2540, glucose is 179, BUN is 35, creatinine 1.93, sodium 141, potassium is 4.8, chloride 104 and CO2 is 28. Troponin 0.03. WBC 13.1, hemoglobin 13, hematocrit 42.2, and platelet count is 83. Urinalysis is grossly negative. Influenza A and B is done and is negative. ABG: PH 7.34, pCO2 is 58, and pO2 is 88 on 50% FiO2.   ASSESSMENT AND PLAN: A 69 year old male with multiple comorbid medical conditions and morbidly obese, came to Emergency Room with shortness of breath and fever.  1.  Acute on chronic respiratory failure. The patient is on chronic oxygen use, 2 to 3 liters at home and has to upgrade up to 4 to 5 liters. Recently here severely hypoxic and requiring BiPAP support. This is secondary to congestive heart failure. Will continue monitoring and treat underlying cause.  2.  Acute congestive heart failure. He does not have an echocardiogram to review, so will get an echocardiogram. Meanwhile, we will give IV Lasix 40 mg b.i.d. and monitoring renal function and urine output.  3.  Hypertension. We will continue home medications. Blood pressure is stable.  4.  Diabetes. We will just decrease the dose of insulin 70/30 as has diet will be controlled and we will put her sliding scale coverage.  5.  Sepsis due to cellulitis. The patient had fever, elevated respiratory  rate, and elevated white cell count. This is due to cellulitis. He will be on vancomycin and Zosyn for infection control.  6.  History of coronary artery disease. Will continue his aspirin and Plavix and statin.   CODE STATUS: Full code.   I discussed with the patient about his condition and possible requirement of intubation and ventilator if he does not improve. He agrees with that. His sister is present in the room and I discussed with her. He does not have a designated healthcare power of attorney, but his wife and sister will make the decision in any adverse situation.   TOTAL TIME SPENT CRITICAL CARE IN THIS ADMISSION: 60 minutes.    ____________________________ Hope Pigeon Elisabeth Pigeon, MD vgv:at D: 08/30/2014 16:08:49 ET T: 08/30/2014 17:04:42 ET JOB#: [id number]  cc: Hope Pigeon. Elisabeth Pigeon, MD, <Dictator> Altamese Dilling MD ELECTRONICALLY SIGNED 09/21/2014 22:11

## 2015-03-04 NOTE — Consult Note (Signed)
General Aspect 69 year old male with past history of hypertension, diabetes, morbid obesity, coronary artery disease and CABG with sleep apnea and CPAP requirement at night, and chronic oxygen, hx of smoking, presentign with respiratory distress and chills.Cardiology consulted for acute on chronic CHF.   Sx presented acutely as he was feeling well yesterday. He presented to the ER with severe SOB, chills.  In the ER, he was noted to be hypoxic and was started on BiPAP for respiratory support.   fever and elevated white cell count of 17.   He reports having chronic bilateral lower extremity chronic swelling and pain.  Denies any significant cough or sputum.    PAST MEDICAL HISTORY: 1.  Hypertension.  2.  Diabetes mellitus.   3.  Coronary artery disease and status post CABG.  4.  Hyperlipidemia. 5.  Obesity hypoventilation syndrome and chronic sleep apnea. Uses CPAP at night and oxygen throughout the day.  6.  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    PAST SURGICAL HISTORY:  CABG.   SOCIAL HISTORY:  Lives with his wife who is wheelchair-bound and disabled. He was a smoker in the very remote past, but was not a very heavy habitual smoker. Denies alcohol or illegal drug use.   FAMILY HISTORY:  Positive for diabetes in multiple family members and leukemia in his brother, coronary artery disease in his mother.   Physical Exam:  GEN well developed, obese   HEENT hearing intact to voice, moist oral mucosa, bipap in place   NECK supple   RESP postive use of accessory muscles  rhonchi   CARD Regular rate and rhythm  Normal, S1, S2  with ectopy   ABD denies tenderness  soft   LYMPH negative neck   EXTR positive edema, 1 + piting to below the knees   SKIN normal to palpation, sores on lower extremities   NEURO motor/sensory function intact   PSYCH lethargic, bipap in place   Review of Systems:  Subjective/Chief Complaint Tired, SOB   General: Fatigue   Skin: No Complaints    ENT: No Complaints   Eyes: No Complaints   Neck: No Complaints   Respiratory: Short of breath   Cardiovascular: Dyspnea  Edema   Gastrointestinal: No Complaints   Genitourinary: No Complaints   Vascular: No Complaints   Musculoskeletal: No Complaints   Neurologic: No Complaints   Hematologic: No Complaints   Endocrine: No Complaints   Psychiatric: No Complaints   Review of Systems: All other systems were reviewed and found to be negative   Medications/Allergies Reviewed Medications/Allergies reviewed     HTN:    diabetes:    cataract surger:    umbilical hernia repair:    cardiac valve replacement:    cardiac stents:    heart cath x5:        Admit Diagnosis:   CHF: Onset Date: 30-Aug-2014, Status: Active, Description: CHF  Home Medications: Medication Instructions Status  oxycodone 5 mg oral tablet 2 tab(s) orally 3 times a day Active  omeprazole 20 mg oral delayed release capsule 1 cap(s) orally 2 times a day Active  furosemide 40 mg oral tablet 1 tab(s) orally 2 times a day Active  Plavix 75 mg oral tablet 1 tab(s) orally once a day (in the afternoon) Active  sertraline 100 mg oral tablet 2 tab(s) orally once a day Active  aspirin 81 mg oral tablet 1 tab(s) orally once a day Active  fluconazole 150 mg oral tablet 1 tab(s) orally once a week  Active  NovoLIN 70/30 human recombinant 70 units-30 units/mL subcutaneous suspension 40 unit(s) subcutaneous once a day (in the morning) Active  NovoLIN 70/30 human recombinant 70 units-30 units/mL subcutaneous suspension 50 unit(s) subcutaneous once a day (at bedtime) Active  metoprolol succinate 100 mg oral tablet, extended release 1 tab(s) orally once a day Active  simvastatin 40 mg oral tablet 1 tab(s) orally once a day (in the evening) Active  isosorbide mononitrate 60 mg oral tablet, extended release 1 tab(s) orally once a day Active  gabapentin 800 mg oral tablet 1 tab(s) orally 2 times a day (in the morning  and in the afternoon) Active  gabapentin 800 mg oral tablet 3 tab(s) orally once a day (at bedtime) Active  allopurinol 100 mg oral tablet 1 tab(s) orally once a day (at bedtime) Active  Nitrostat 0.4 mg sublingual tablet 1 tab(s) sublingual every 5 minutes, As Needed - for Chest Pain Active  budesonide 0.5 mg/2 mL inhalation suspension 2 milliliter(s) inhaled 2 times a day Active   Lab Results: Routine Micro:  20-Oct-15 07:54   Micro Text Report BLOOD CULTURE   COMMENT                   NO GROWTH IN 8-12 HOURS   ANTIBIOTIC                       Culture Comment NO GROWTH IN 8-12 HOURS  Result(s) reported on 30 Aug 2014 at 04:00PM.  Routine Chem:  20-Oct-15 07:54   Result Comment HGB/HCT - RESULTS VERIFIED BY REPEAT TESTING.  Result(s) reported on 30 Aug 2014 at 08:23AM.  Glucose, Serum  179  BUN  35  Creatinine (comp)  1.93  Sodium, Serum 141  Potassium, Serum 4.8  Chloride, Serum 104  CO2, Serum 28  Calcium (Total), Serum 8.5  Anion Gap 9  Osmolality (calc) 294  eGFR (African American)  45  eGFR (Non-African American)  37 (eGFR values <60mL/min/1.73 m2 may be an indication of chronic kidney disease (CKD). Calculated eGFR, using the MRDR Study equation, is useful in  patients with stable renal function. The eGFR calculation will not be reliable in acutely ill patients when serum creatinine is changing rapidly. It is not useful in patients on dialysis. The eGFR calculation may not be applicable to patients at the low and high extremes of body sizes, pregnant women, and vetetarians.)  B-Type Natriuretic Peptide [facility])  2540 (Result(s) reported on 30 Aug 2014 at 08:29AM.)  Cardiac:  20-Oct-15 07:54   CK, Total 54 (39-308 NOTE: NEW REFERENCE RANGE  12/13/2013)  CPK-MB, Serum 1.0 (Result(s) reported on 30 Aug 2014 at 08:29AM.)  Troponin I 0.03 (0.00-0.05 0.05 ng/mL or less: NEGATIVE  Repeat testing in 3-6 hrs  if clinically indicated. >0.05 ng/mL: POTENTIAL  MYOCARDIAL  INJURY. Repeat  testing in 3-6 hrs if  clinically indicated. NOTE: An increase or decrease  of 30% or more on serial  testing suggests a  clinically important change)  Routine Hem:  20-Oct-15 07:54   WBC (CBC)  13.1  RBC (CBC) 5.07  Hemoglobin (CBC) 13.0  Hematocrit (CBC) 42.2  Platelet Count (CBC)  124  MCV 83  MCH  25.7  RDW  17.4   EKG:  Interpretation EKG shows NSR with PVCs in a trigeminal pattern, RBBB   Radiology Results: XRay:    20-Oct-15 07:48, Chest Portable Single View  Chest Portable Single View   REASON FOR EXAM:    short of  breath and  hypoxia and fever  COMMENTS:       PROCEDURE: DXR - DXR PORTABLE CHEST SINGLE VIEW  - Aug 30 2014  7:48AM     CLINICAL DATA:  Shortness of breath, hypoxia, fever.    EXAM:  PORTABLE CHEST - 1 VIEW    COMPARISON:  None.    FINDINGS:  Low lung volumes. No focal consolidation. No pleural effusion or  pneumothorax.  The heart is top-normal in size for inspiration. Postsurgical  changes related to prior CABG.     IMPRESSION:  No evidence of acute cardiopulmonary disease.      Electronically Signed    By: Julian Hy M.D.    On: 08/30/2014 07:54         Verified By: Julian Hy, M.D.,    No Known Allergies:   Vital Signs/Nurse's Notes: **Vital Signs.:   20-Oct-15 12:59  Vital Signs Type Admission  Temperature Temperature (F) 100  Celsius 37.7  Temperature Source axillary  Pulse Pulse 80  Respirations Respirations 24  Systolic BP Systolic BP 341  Diastolic BP (mmHg) Diastolic BP (mmHg) 52  Mean BP 68  Pulse Ox % Pulse Ox % 94  Oxygen Delivery Non-invasive ventilation (CPAP/BIPAP)    Impression 69 year old male with past history of hypertension, diabetes, morbid obesity, coronary artery disease and CABG with sleep apnea and CPAP requirement at night, and chronic oxygen, hx of smoking, presentign with respiratory distress and chills.Cardiology consulted for acute on chronic CHF.   1)  Respiratory distress: Suspect acute on chronic diastolic CHF (echo pending) in setting of obesity hypoventilation syndrome, hypercapnea. CO2 still elevated after being on bipap all day --would continue bipap -gentle diuresis with close monitoring daily of renal function. For climb in BUN and creatinine, may need to hold lasix --Agree with broad ABX  2) Cellulitis: sores noted on his legs b/l on broad ABX, may need contact precautions.  3)  Coronary artery disease and status post CABG.  troponin up to 0.13 likely demand ischemia from hypoxia echo pending, no further workup at this time  4) Chronic lymphedema on both legs. Significant erythema, sores, agree with ABX, consider leg wraps  5)  Diabetes mellitus. per medicine service  6)Renal dysfunction, uncertain if acute or chronic May be able to request old labs from [facility] Would monitor closely on lasix  7) HTN: hold parameters on BP meds given hypotension, possible sepsis from cellulitis   Electronic Signatures: Ida Rogue (MD)  (Signed 20-Oct-15 18:29)  Authored: General Aspect/Present Illness, History and Physical Exam, Review of System, Past Medical History, Health Issues, Home Medications, Labs, EKG , Radiology, Allergies, Vital Signs/Nurse's Notes, Impression/Plan   Last Updated: 20-Oct-15 18:29 by Ida Rogue (MD)

## 2015-03-04 NOTE — Consult Note (Signed)
PATIENT NAME:  Ginette OttoLINDLEY, Adam V MR#:  [id number] DATE OF BIRTH:  [birth year]  DATE OF CONSULTATION:  09/01/2014  REFERRING PHYSICIAN:   CONSULTING PHYSICIAN:  Stann Mainlandavid P. Sampson GoonFitzgerald, MD  REQUESTING PHYSICIAN:  Hope PigeonVaibhavkumar G. Elisabeth PigeonVachhani, MD.    REASON FOR CONSULTATION: Staphylococcus aureus bacteremia and cellulitis.   HISTORY OF PRESENT ILLNESS: This is a 69 year old gentleman with history of morbid obesity, hypertension, diabetes, coronary artery disease, status post CABG, obstructive sleep apnea, who was admitted October 20th with symptoms of severe shortness of breath and chills. He had a white count on admission of 17 and also had evidence of bilateral lower extremity cellulitis. He has chronic lymphedema of both lower extremities. Since admission, he has been started on antibiotics, but has continued to have respiratory issues. His blood cultures are positive 2/2 of Staphylococcus aureus, sensitivities yet to be identified.   PAST MEDICAL HISTORY:  1. Hypertension.  2. Diabetes.  3. Coronary artery disease, status post CABG.  4. Hyperlipidemia.  5. Obesity hypoventilation syndrome and chronic sleep apnea. He uses CPAP at night and oxygen throughout the day.  6. Chronic lymphedema of the legs.   PAST SURGICAL HISTORY: CABG.   SOCIAL HISTORY: Lives with his wife. He was a prior smoker. Denies alcohol or drug use.   FAMILY HISTORY: Noncontributory.   REVIEW OF SYSTEMS:  Unable to obtain as the patient is on BiPAP at this time.   ALLERGIES: No known drug allergies.   ANTIBIOTICS SINCE ADMISSION: Include Zosyn and linezolid. He also received potentially a dose of vancomycin but I am unsure if that was given. Other medications are reviewed in his MAR.   PHYSICAL EXAMINATION:  VITAL SIGNS: Temperature 97.3, pulse 81, blood pressure 112/61, respirations 19, saturation 93% on 5 liters BiPAP. Temperature max since admission 100.3.  GENERAL: He is morbidly obese, lying in bed in respiratory  distress on BiPAP.  HEENT: Pupils are equal, round, reactive to light and accommodation. Extraocular movements are intact. Sclerae anicteric. Oropharynx is clear.   NECK: Supple.  HEART: Very distant heart sounds.  LUNGS: Coarse breath sounds bilaterally.  ABDOMEN: Massively distended and obese.  He has a very large pannus.   EXTREMITIES:  He has 3+ bilateral lower extremity chronic lymphedema.  SKIN: He has anterior bilateral shins extensive erythema and dry, scaly skin. There are a few areas of excoriation. There is no purulence or drainage.   DATA: White blood count on admission was 13.1, hemoglobin 13.0, platelets 124,000. Micro:  Blood cultures done October 20th, two of 2 positive for Staphylococcus aureus, sensitivities pending. Renal function on admission showed a BUN 35, creatinine 1.93. Creatinine is currently 2.93. BNP was elevated at 2540.  Troponins were slightly positive at 0.13. Echocardiogram done October 20th showed imaging study was challenging, ejection fraction 50 to 55%, mild LVH, moderately enlarged right ventricle, mildly dilated right atrium, moderately dilated left atrium, mild mitral regurgitation, mild tricuspid regurgitation, moderately elevated pulmonary systolic pressure, mild dilation of aortic root.  Chest x-ray October 20th showed no evidence of acute cardiopulmonary disease.   IMPRESSION: A 69 year old gentleman with morbid obesity, coronary artery disease status post coronary artery bypass grafting, congestive heart failure, diabetes, admitted with fevers and dyspnea. He has a mildly elevated white count and blood cultures are positive for Staphylococcus aureus. His echocardiogram was a poor study. Clinically, he is quite ill requiring bilevel positive airway pressure for his respiratory status. His renal function has also been worsening. Likely source of the staph bacteremia is his bilateral  lower extremity cellulitis. However, I am very concerned for endocarditis as  well. Given his morbid obesity and current poor respiratory status, I am hesitant to suggest a transesophageal echocardiogram although he may need that if he improves. He is also on linezolid instead of vancomycin given his renal function, which is worsening.  Linezolid is not ideal for Staphylococcus aureus bacteremia or endocarditis; however, until we get sensitivities I would continue that. Other options would be daptomycin if it is methicillin resistant Staphylococcus aureus although his morbid obesity, it would be difficult to know how much to dose him with.  If it is Methicillin sensitive Staphylococcus aureus, then a beta-lactam would be the best choice. I am also hesitant to use gentamicin given his renal function.   RECOMMENDATIONS:  1. Continue linezolid and  Zosyn until further sensitivities.  2. Continue cardiac management and diuresis per cardiology and primary care.  3. Elevate legs for the cellulitis.  4. Repeat blood cultures to ensure clearance. He will need at least 2 weeks of IV antibiotics from that date he clears his cultures for the Staphylococcus aureus bacteremia. If he is found to have endocarditis, he would need a longer course.   5. Thank you very much for the consult. I will be glad to follow with you.    ____________________________ Stann Mainland. Sampson Goon, MD dpf:by D: 09/01/2014 16:11:22 ET T: 09/01/2014 21:46:09 ET JOB#: [id number]  cc: Stann Mainland. Sampson Goon, MD, <Dictator> Naomi Fitton Sampson Goon MD ELECTRONICALLY SIGNED 09/06/2014 9:55

## 2015-03-04 NOTE — Discharge Summary (Signed)
PATIENT NAME:  [name], [name] MR#:  [id number] DATE OF BIRTH:  06/10/1946  DATE OF ADMISSION:  08/30/2014 DATE OF DISCHARGE:  09/06/2014   ADMITTING DIAGNOSIS: Shortness of breath and fever.   DISCHARGE DIAGNOSES: 1.   Acute on chronic respiratory failure.  Hypoxic hypercarbic in nature, likely due to a combination of severe obesity hypoventilation syndrome, severe sleep apnea, as well as acute on chronic obstructive pulmonary disease exacerbation with acute on chronic diastolic congestive heart failure.  2.  Sepsis related to cellulitis of the left lower extremity due to methicillin sensitive Staphylococcus aureus status post infectious disease evaluation; is on IV antibiotics.  3.   Acute on chronic renal failure, likely due to overdiuresis. Renal function, now stable.  4.  Acute on chronic obstructive pulmonary disease exacerbation.  5.  Depression.  6.  Hyperlipidemia.  7.  Hypertension.  8.  Morbid obesity.  9.  Coronary artery disease status post coronary artery bypass graft.  10.  Chronic lymphedema of the lower extremities.  11.  Generalized deconditioning and weakness.  12.  Hyperlipidemia.   CONSULTANTS DURING HOSPITALIZATION:  Clydie Braunavid Fitzgerald, M.D.; Julien Nordmannimothy Gollan, M.D.; Freda MunroSaadat Khan, M.D.    PERTINENT LABORATORY AND EVALUATIONS: Admitting glucose 179. BUN 35, creatinine 1.93. Sodium 141, potassium 4.8, chloride 104, CO2 is 28, calcium is 8.5. Troponin was 0.03, 0.13.   WBC count was 13.1, hemoglobin 13. Platelet count was 124,000.   Blood cultures showed methicillin sensitive Staph aureus x2.    The urinalysis showed 1+ blood, nitrites negative, leukocytes negative.   ABG:  pH of 7.34, pCO2 of 58, pO2 of 88.   Stool for C. difficile was negative.   Most recent BUN and creatinine on the 26th was 51, BUN and creatinine 1.61.   RADIOGRAPHS:  Chest x-ray on the 20th showed no acute abnormality; however, subsequent chest (Dictation Anomaly) <MISSING TEXT>>  HOSPITAL  COURSE: Please refer to H and P done by the admitting physician. The patient is a 69 year old white male who was admitted with acute onset of shortness of breath and fever. The patient even though chest x-ray appeared negative he was thought to have acute CHF as well as acute COPD exacerbation. The patient had to be transferred to the ICU for respiratory distress. He was kept on BiPAP. Did not require ventilator. He slowly improved with the diuresis as well as treatment for COPD exacerbation.   The patient was also noticed to have sepsis due to left lower extremity cellulitis. His blood cultures grew MSSA. He was treated with IV Zosyn and IV Zyvox, which was subsequently switched over to IV Ancef. He was seen by Dr. Sampson GoonFitzgerald who recommended 2 weeks of therapy post last negative blood cultures. In terms of the antibiotics the patient also had a PICC line  placed which he will finish the course on 09/15/2014. The patient has been afebrile. At this point, he is very deconditioned and needs further aggressive rehab.   Discharge instructions for congestive heart failure given.   DISCHARGE MEDICATIONS: Oxycodone 10 mg t.i.d., omeprazole 20 mg 1 tab p.o. b.i.d., Lasix 40 mg 1 tab p.o. b.i.d., Plavix 75 p.o. daily, sertraline 100 one to two tabs daily, aspirin 81 mg 1 tab p.o. daily, fluconazole 150 weekly, 70/30 units of insulin 50 units at bedtime, metoprolol succinate 100 daily, simvastatin 40 every evening, isosorbide nitrate 60 mg daily, gabapentin 800 three caps at bedtime, allopurinol 100 at bedtime, Nitrostat 0.4 sublingual p.r.n., budesonide 2 puffs b.i.d., 70/30 at 40 units in  the a.m., gabapentin 800 one tab p.o. b.i.d., prednisone taper start at 60 mg taper by 10 until complete, dextromethorphan 10 mL q.12 p.r.n. cough, alprazolam 0.5 every 8 p.r.n. anxiety, albuterol ipratropium every 6 hours, cefazolin 2 grams IV q.8 (stop date 09/15/2014), guaifenesin 600 one tab p.o. b.i.d., chlorpheniramine  hydrocodone 5 mL b.i.d. as needed for cough, O2 at 4 liters nasal cannula.   DIET:  Low sodium, low fat, low cholesterol.   ACTIVITY: As tolerated with PT evaluation and treatment.   FOLLOWUP: Follow with nursing home M.D. in 1 to 2 weeks. Follow with Dr. Sampson Goon 2 to 4 weeks.  Weekly CBC, CMP.  The patient has to wear his CPAP at bedtime   Time spent on this patient's discharge is 45 minutes.    ____________________________ Lacie Scotts Allena Katz, MD shp:jw D: 09/06/2014 14:10:08 ET T: 09/06/2014 14:34:50 ET JOB#: [id number]  cc: Wenceslao Loper H. Allena Katz, MD, <Dictator> Charise Carwin MD ELECTRONICALLY SIGNED 09/17/2014 8:37

## 2015-03-08 NOTE — H&P (Signed)
PATIENT NAME:  [name], [name] MR#:  [id number] DATE OF BIRTH:  1946-06-03  DATE OF ADMISSION:  11/13/2014  REFERRING PHYSICIAN: Eartha Inchory R. York CeriseForbach, MD   [facility]   CHIEF COMPLAINT: Fever.   HISTORY OF PRESENT ILLNESS: A 69 year old Caucasian gentleman with history of obesity; hypoventilation syndrome; obstructive sleep apnea; essential hypertension; type 2 diabetes, insulin-requiring with lower extremity neuropathy and coronary disease status post coronary artery bypass graft presenting with fever. He is unable to provide any meaningful information given mental status and medical condition. Family at bedside and provide history. He is currently residing in a rehabilitation facility secondary to lower extremity wounds and inability to ambulate. They were notified by the nursing staff that he had a fever and sent to the hospital for further workup and evaluation. As far as any preceding symptomatology, they notice that he had been slightly more lethargic over the last 2-3 days' duration. Otherwise, no further symptoms were noted by them or the nursing staff.   REVIEW OF SYSTEMS: Unobtainable given patient's mental status and medical condition.   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   SOCIAL HISTORY: Remote tobacco use. No alcohol or drug use. Currently resides in a nursing facility.   FAMILY HISTORY: Positive for diabetes, coronary artery disease.   ALLERGIES: No known drug allergies.   HOME MEDICATIONS: Budesonide 2  inhalation b.i.d.; aspirin 81 mg p.o. daily; oxycodone 5 mg 2 tablets p.o. 3 times  a day as needed for pain; Imdur 60 mg p.o. daily; Nitrostat 0.4 mg sublingual for chest pain; gabapentin 800 mg p.o. b.i.d.; sertraline 100 mg 2 tablets at bedtime; Novolin 70/30 at 50 units at bedtime, 40 units in the morning; allopurinol 100 mg p.o. daily at bedtime; atorvastatin 20 mg p.o. daily at bedtime; Plavix 75 mg p.o. daily; alprazolam 0.5 mg p.o. q. 8 hours as needed for anxiety; metoprolol 100 mg p.o. daily; DuoNeb treatments 3 mL every 6 hours as needed for shortness of breath;  nystatin 100,000 units topically to abdominal folds and groin 3 times daily; Lasix 40 mg p.o. b.i.d.; Flonase 50 mcg inhalation 2 sprays daily; Protonix 40 mg p.o. daily.   PHYSICAL EXAMINATION:  VITAL SIGNS: Temperature of 100.7 degrees Fahrenheit, heart rate 92, respirations 21, blood pressure 113/56, saturating 94% on 3 L nasal cannula. Weight 132.5 kg, BMI 37.5.  GENERAL: Chronically ill-appearing Caucasian gentleman, currently in minimal distress given mental status.  HEAD: Normocephalic, atraumatic.  EYES: Pupils equal, round, sluggish,  reactive light. Unable to fully assess extraocular muscles given the patient's mental status and medical condition. No scleral icterus.  MOUTH: Dry mucosal membranes. Dentition poor. No abscess noted.  EAR, NOSE, THROAT: Clear without exudates. No external lesions.  NECK: Supple. No thyromegaly. No nodules. No JVD.  PULMONARY: Diminished breath sounds bilaterally given poor respiratory status he has no frank wheezes, rales, rhonchi. No use of accessory muscles. Poor respiratory effort, as stated above.  CHEST: Nontender to palpation.  CARDIOVASCULAR: S1, S2, regular rate and rhythm. No murmurs, rubs, or gallops. No edema. Pedal pulses 2+ bilaterally.  GASTROINTESTINAL: Soft, nontender, nondistended. No masses. Positive bowel sounds. No hepatosplenomegaly.  MUSCULOSKELETAL: No swelling, clubbing. There is 1+ edema of lower extremities to  the knees. Passive range of motion  full.  NEUROLOGIC: Unable to fully assess given patient's mental status and medical condition. Sensation intact. Reflexes intact.  SKIN: Bilateral lower extremities skin changes consistent with chronic venous stasis. There is also is of some evidence of healing and new weeping edema bilateral lower extremities; however, no surrounding erythema. It is not warm to touch. No purulent discharge noted.  PSYCHIATRIC: Unable to fully assess given patient's mental status and medical condition.   LABORATORY AND RADIOGRAPHIC DATA: Chest x-ray performed which reveals shallow inspirations with atelectasis of lung bases. Remainder of laboratory data: Sodium of 144, potassium 3.6, chloride of 103, bicarbonate of 33, BUN 66, creatinine 2.7, glucose of 138, magnesium of 1.6, protein 6.5, albumin 3.1 otherwise LFTs within normal limits. Troponin of 0.09. WBC of 8.4, hemoglobin of 11.8, platelets of 111,00. Urinalysis performed WBCs 928, RBCs 15, leukocyte esterase 3+, nitrite negative, epithelial cells 1.   ASSESSMENT AND PLAN: A 69 year old Caucasian gentleman with history of obesity, hypoventilation syndrome, obstructive sleep apnea, presenting with fever.  1.  Sepsis secondary to urinary tract infection: Of note, has a history of extended-spectrum beta-lactamase Klebsiella meets septic criteria by respiratory rate, heart rate present on admission. Panculture including blood and urine. Antibiotic coverage with meropenem. Has received vancomycin in the Emergency Department. We will continue this for now. Taper all antibiotics when culture data returns.  2.  Acute kidney injury: Intravenous fluid hydration. Note, he does have a history of diastolic congestive heart failure. Gentle hydration. Follow urine output and renal function. 3.  Hypomagnesemia: Replace with goal of 2.  4.  Elevated troponin in the setting of acute kidney injury: No evidence of cardiovascular illnesses that are active. Place on telemetry. Trend  cardiac enzymes x 3.  5.  Obstructive sleep apnea and obesity hypoventilation syndrome on CPAP therapy: We will check an ABG now, to make sure he is not retaining, as he is somewhat somnolent.  6.  Type 2 diabetes: Continue with his 70/30 insulin at his home doses as well as insulin sliding scale and q. 6 hour Accu-Cheks. 7.  Venous thromboembolism prophylaxis: Heparin subcutaneous.   CODE STATUS: Patient is full code.  TIME SPENT: 45 minutes.    ____________________________ Cletis Athens. Yaretsi Humphres, MD dkh:bm D: 11/13/2014 01:48:29 ET T: 11/13/2014 02:02:56 ET JOB#: [id number]  cc: Cletis Athens. Vineeth Fell, MD, <Dictator> Nayson Traweek Synetta Shadow MD ELECTRONICALLY SIGNED 11/13/2014 20:38

## 2015-03-12 NOTE — Op Note (Signed)
PATIENT NAME:  [name], [name] MR#:  [id number] DATE OF BIRTH:  03-16-1946  DATE OF PROCEDURE:  11/23/2014  ATTENDING PHYSICIAN: Cristal Deerhristopher A. Jaymarion Trombly, MD   PREOPERATIVE DIAGNOSIS: Hypotension and sepsis.   POSTOPERATIVE DIAGNOSIS: Hypotension and sepsis.  PROCEDURE PERFORMED: Ultrasound-guided right internal jugular central venous catheter placement.   ANESTHESIA: None.   ESTIMATED BLOOD LOSS: 10 mL.  COMPLICATIONS: None.   INDICATION FOR PROCEDURE: [name] is a pleasant 69 year old male who presents with hypotension and presumed sepsis with poor IV access. I was consulted for central venous catheter placement.   DETAILS OF PROCEDURE: Informed consent was obtained. [name] was laid supine on his bed. Ultrasound needle was placed over his right internal jugular vein.  It was noted that with inspiration it easily collapsed, but it was patent. I then prepped his neck in the sterile standard surgical fashion. The ultrasound was used to access the internal jugular vein with 1 stick. A wire was placed through the needle. The needle was withdrawn. The tract was dilated. A well-flushed triple-lumen catheter was placed over the wire. It was flushed and drawn and sutured in place. Post procedure x-ray showed no pneumothorax and catheter in appropriate position. The patient's drapes were then taken down and a sterile dressing was placed over the central venous catheter. There were no immediate complications. Needle, sponge and instrument count was correct at the end of the procedure.    ____________________________ Si Raiderhristopher A. Nicholes Hibler, MD cal:TT D: 11/23/2014 20:49:43 ET T: 11/23/2014 22:21:24 ET JOB#: [id number]  cc: Cristal Deerhristopher A. Morrill Bomkamp, MD, <Dictator> Jarvis NewcomerHRISTOPHER A Jamile Sivils MD ELECTRONICALLY SIGNED 11/30/2014 9:03

## 2015-03-12 NOTE — Discharge Summary (Signed)
PATIENT NAME:  [name], [name] MR#:  [id number] DATE OF BIRTH:  01/23/1946  DATE OF ADMISSION:  11/13/2014 DATE OF DISCHARGE:  11/17/2014  ADMITTING DIAGNOSES: Fever.   DISCHARGE DIAGNOSES:  1.  Fever due to sepsis due to extended spectrum beta-lactamase producing Klebsiella.  2.  Chronic respiratory failure.  3.  Acute kidney injury.  4.  Hypomagnesemia, status post replacement.  5.  Elevated troponin in the setting of acute kidney injury, sepsis felt to be due to demand ischemia.  6.  Obstructive sleep apnea.  7.  Type 2 diabetes.  8.  Morbid obesity.  9.  Chronic lower extremity wounds.  10. Essential hypertension.  11. Diabetes complicated by peripheral neuropathy.  12. Coronary artery disease, status post coronary artery bypass graft.  13. Chronic lymphedema of the lower extremities.   14. Hyperlipidemia, unspecified.   CONSULTANTS: None.   PERTINENT LABS AND EVALUATIONS: Stool for Clostridium difficile was negative. Admitting glucose 138, BUN 66, creatinine 2.70, sodium 144, potassium 3.6, chloride 103, CO2 of 33, magnesium was 1.6. LFTs showed a bilirubin total 1.2, albumin 3.1, troponin 0.09. WBC 8.4, hemoglobin 11.8, platelet count was 111,000, urine cultures greater than 100,000 Klebsiella ESBL producing.   HOSPITAL COURSE: Please refer to H and P done by the admitting physician. The patient is a 69 year old, morbidly obese male, who currently resides in a skilled nursing facility was brought in with fevers. The patient was evaluated and noted to have urinary tract infection.  His urine culture subsequently did grow Klebsiella ESBL. The patient was treated with Invanz with resolution of his fever. He was also noticed to have acute renal failure, which was treated with IV fluids with renal function back to his baseline. The patient also noted to have elevated troponin, felt to be due to demand ischemia. Currently, he is doing much better and is stable for discharge back to the  skilled nursing facility.   DISCHARGE MEDICATIONS: Lasix 40 mg 1 tab p.o. b.i.d., Plavix 75 p.o. daily, fluconazole 150 once a week on Thursday, Novolin 70/30, 50 units at bedtime, gabapentin 80 at 1 tabs p.o. b.i.d., allopurinol 100 at bedtime, Nitrostat 0.4 sublingual every 5 p.r.n., Symbicort 2 mL b.i.d., dextromethorphan 10 mL every 12 p.r.n., albuterol, ipratropium 3 mL every 6. aspirin 81 mg 1 tab p.o. daily, isosorbide mononitrate 60 daily, metoprolol succinate 100 daily, sertraline 100, 2 tabs daily, atorvastatin 20 at bedtime, Cepacol 1 lozenge every 4 p.r.n., Flonase 50 mcg 2 sprays daily, nystatin powder to abdominal folds, (Dictation Anomaly)  and buttock 3 times a day, guaifenesin 600, at 1 tabs p.o. every 12, Protonix 40 daily, oxycodone 10 mg t.i.d., alprazolam 0.5 every eight p.r.n., Novolin 70/30, 40 units in the morning, gabapentin 800 at bedtime, Flomax 0.4 daily, Invanz 1 gram IM every 24 x 6 days.   DIET: Low sodium, low fat, low cholesterol, carbohydrate control.   ACTIVITY: As tolerated. PT evaluation and treat.   FOLLOW-UP:  With the skilled nursing facility MD in 1 to 2 weeks. Wound care nurse at facility for chronic lower extremity wounds of the bilateral lower extremity.    TIME SPENT: 35 minutes on discharge.     ____________________________ Lacie ScottsShreyang H. Allena KatzPatel, MD shp:DT D: 11/17/2014 11:30:00 ET T: 11/17/2014 14:18:47 ET JOB#: [id number]  cc: Mika Anastasi H. Allena KatzPatel, MD, <Dictator> Charise CarwinSHREYANG H Dammon Makarewicz MD ELECTRONICALLY SIGNED 11/19/2014 9:26

## 2015-03-12 NOTE — H&P (Signed)
PATIENT NAME:  [name], [name] MR#:  [id number] DATE OF BIRTH:  1946/07/31  PR[facility] CARE PHYSICIAN[facility]  [facility]  REFERRING EMERGENCY ROOM PHYSICIAN: Eryka A. Inocencio HomesGayle, MD   CHIEF COMPLAINT: Fever, altered mental status, and hypotension.   HISTORY OF PRESENT ILLNESS: This 69 year old man with past medical history of diastolic congestive heart failure, obesity hypoventilation syndrome, obstructive sleep apnea, hypertension, type 2 diabetes, and coronary artery disease, status post coronary artery bypass grafting, who was recently admitted to this hospital from January 3 through January 7 due to sepsis secondary to ESBL, Klebsiella urinary tract infection. Presents today with temperatures up to 102, decreased mentation, decreased blood pressures at his living facility, [facility]. His presentation today is very similar to presentation January 3.   The history is provided by his sister and wife, as the patient cannot answer questions at this time. They report that yesterday, he was doing fairly well. Today, he has been very confused and lethargic. There has been no recent nausea, vomiting, or diarrhea. He has complained of occasional dysuria. He has had a dry cough and has complained of being slightly short of breath. At baseline he is on 4 liters of oxygen via nasal cannula. He has occasional bilateral lower extremity swelling, which is actually very much decreased at this time. He has had some hypoglycemic events in the past few days and overnight last night blood sugars were being checked throughout the night due to hypoglycemia.  On presentation to the Emergency Room, he is hypotensive at 80/49, he has an elevated white blood cell count of 18,000. Hospitalist services are asked to admit for further evaluation and treatment of sepsis.   PAST MEDICAL AND SURGICAL HISTORY: 1.  Hypertension.  2.  Type 2 diabetes mellitus requiring insulin.  3.  Diabetic peripheral neuropathy.  4.  Coronary artery  disease, status post coronary artery bypass grafting.  5.  Chronic lymphedema of lower extremities, very much decreased at this time.  6.  Obstructive sleep apnea and obesity hypoventilation syndrome with chronic respiratory failure requiring CPAP at night and 4 liters of nasal cannula oxygen during the daytime.  7.  Hyperlipidemia.  8.  Generalized weakness and deconditioning, bedbound and wheelchair-bound for the past 5 months.  9.  Cataract surgery. 10.  Umbilical hernia repair.  11.  Cardiac valve repair.  12.  Cardiac stent and cardiac catheterization x 5.   REVIEW OF SYSTEMS: Unable to get a full review of systems due to patient's altered mental status.   SOCIAL HISTORY: The patient has used tobacco in the past, no longer smoking cigarettes. Does not drink alcohol or use illicit substances. He is currently a [facility]. He is bedbound, occasionally using a wheelchair. He uses 4 liters of nasal cannula chronically and CPAP at night. His wife is present and he agrees that she would be his power of attorney. He does not at this point have an advanced directive or living will.   FAMILY MEDICAL HISTORY: Positive for diabetes and coronary artery disease.   ALLERGIES: No known allergies.   HOME MEDICATIONS: 1.  Tamsulosin 0.4 mg 1 capsule daily.  2.  Sertraline 100 mg 2 tablets once a day in the evening.  3.  Protonix 40 mg 1 tablet daily.  4.  Plavix 75 mg 1 tablet daily.  5.  Oxycodone 5 mg 2 tablets 3 times a day as needed for pain.  6.  Nystatin topical 100,000 units per gram topical powder, apply  to abdominal folds, groin, and buttocks 3 times a day.  7.  Novolin 70/30, 30 units twice a day.  8.  Nitrostat 0.4 mg 1 tablet sublingual every 5 minutes as needed for chest pain.  9.  Metoprolol succinate 100 mg 1 tablet once a day in the morning.  10.  Isosorbide mononitrate 60 mg 1 tablet once a day in the morning.  11.  Invanz 1 gram injectable powder 1 gram every  24 hours x 6 days, stop date January 14. 12.  Guaifenesin 100 mg 1 tablet every 12 hours as needed for cough.  13.  Gabapentin 800 mg 3 tablets at bedtime and 1 tablet with breakfast and lunch.  14.  Furosemide 40 mg 1 tablet 2 times a day.  15.  Fluconazole 150 mg 1 tablet once a week on Thursdays.  16.  Flonase 50 mcg per inhalation nasal spray 2 sprays to each nostril once a day.  17.  Dextromethorphan 30 mg/5 mL oral suspension 10 mL every 12 hours as needed for cough. 18.  Cepacol Extra Strength 1 lozenge every 4 hours as needed for sore throat.  19.  Budesonide 0.5 mg/2 mL inhalation suspension 2 mL inhaled twice a day.  20.  Atorvastatin 20 mg 1 tablet once a day.  21.  Aspirin 81 mg 1 tablet once a day.  22.  Alprazolam 0.5 mg 1 tablet every 8 hours as needed for anxiety.  23.  Allopurinol 100 mg 1 tablet once a day.  24.  Albuterol, ipratropium 2.5 mg/0.5 mg/3 mL inhalation solution 3 mL inhaled every 6 hours.  PHYSICAL EXAMINATION:  VITAL SIGNS: Temperature 97.8, pulse of 68, respirations 18. Presenting blood pressure is 82/49. After 3 liters of normal saline is 121/73, on my examination is 100/70, oxygenation 97% on 4 liters nasal cannula.  GENERAL: The patient appears critically ill, lethargic.  HEENT: Pupils equal, round, and reactive to light. He does have a refractive lens on the left. Extraocular motion intact. Conjunctivae are clear. Oral mucous membranes are dry. Posterior oropharynx is clear with no exudate, erythema, or edema. There is some blood streaked sputum in the back of the throat and in the left nostril after nosebleed.  NECK: Supple. No cervical lymphadenopathy. Trachea is midline.  RESPIRATORY: Lungs are clear to auscultation bilaterally on anterior examination, no respiratory distress.  CARDIOVASCULAR: Regular rate and rhythm, no murmurs, rubs, or gallops. No peripheral edema. Peripheral pulses are 1+.  ABDOMEN: Obese, soft, nontender, nondistended. No guarding, no  rebound, no hepatosplenomegaly, no mass. Bowel sounds are normal.  EXTREMITIES: The patient is diffusely weak. There are no joint effusions. Passive range of motion is normal. He does not participate in the strength examination.  NEUROLOGIC: Cranial nerves II-XII are grossly intact. He seems diffusely weak and does not participate well in the neurologic examination. He complains of extreme pain when palpating  either foot, left greater than right.  SKIN: There are 2 skin tears on the left forearm, which are clean with no exudate. There is a stage I decubitus ulceration in the intergluteal fold with no surrounding edema, erythema, no exudate, otherwise no other rash or skin changes noted.  PSYCHIATRIC: The patient is lethargic, he is oriented to person, place, and situation. He does not show any signs of overt depression or anxiety at this time.   LABORATORY DATA: Sodium 140, potassium 3.8, chloride 100, bicarbonate 33, BUN 27, creatinine 2.0, glucose 112, total protein 5.5, albumin 2.5. Other LFTs are normal. Troponin is  0.04. White blood cell count is 18.5, hemoglobin 11.2, platelets 161,000, MCV is 87, INR is 1.2. Note that his C. difficile was negative on January 7.  UA is negative for overt signs of infection with 1 white blood cell per high-powered field. Lactic acid is 1.1.   EKG is normal sinus with a right bundle branch block.   IMAGING: Chest x-ray shows bibasilar atelectasis with low lung volumes.   ASSESSMENT AND PLAN: 1.  Sepsis: Urinalysis, chest x-ray, and skin examination are negative for overt signs of infection.  Blood cultures are pending.  He does have a recent ESBL Klebsiella infection which has been treated with Invanz up until today. His Clostridium difficile was negative on January 7. He is being admitted to the critical care unit due to hypotension. He has already received 4 liters of intravenous fluids. Will possibly consult surgery if blood pressure does not improve over the  next few hours for placement of a central line and initiation of pressors. We will continue with broad-spectrum antibiotics. At this point, the source of his sepsis has not yet been identified.  2.  Acute kidney injury: Likely due to hypotension and decreased p.o. intake. We will recheck now that he has received significant volume. We will need to continue to monitor. Electrolytes are normal. His baseline creatinine seems to be about 1.5.  3.  Diastolic congestive heart failure: We will need to monitor carefully as we are giving him high volume resuscitation. At this point, he is not showing any signs of volume overload.  4.  Obstructive sleep apnea, obesity hypoventilation syndrome: We will have CPAP at night, during this hospitalization. We will continue with 4 liters of nasal cannula as he is on at home. We will check an ABG now to be sure that he is not retaining.  5.  Diabetes mellitus, type 2, requiring insulin: We will continue sliding scale insulin and monitoring blood sugars carefully.  6.  Hypertension. He is hypotensive at this time. Will hold all of his antihypertensive agents.  7.  History of coronary artery disease: Continue Plavix, aspirin.  8.  Prophylaxis: Heparin for deep vein thrombosis prophylaxis, Protonix for gastrointestinal prophylaxis.  9.  Goals of care: I have consulted palliative care for assistance in establishing goals of care. The patient states he has never had this conversation in the past. His wife is interested in discussing advanced directive.   TIME SPENT ON ADMISSION: 50 minutes.   ____________________________ Ena Dawley. Clent Ridges, MD cpw:LT D: 11/23/2014 16:31:24 ET T: 11/23/2014 16:47:51 ET JOB#: [id number]  cc: Santina Evans P. Clent Ridges, MD, <Dictator> Vic Ripper. Mariana Kaufman, MD Gale Journey MD ELECTRONICALLY SIGNED 12/01/2014 18:45

## 2015-03-12 NOTE — Discharge Summary (Signed)
PATIENT NAME:  [name], [name] MR#:  [id number] DATE OF BIRTH:  15-Sep-1946  DATE OF ADMISSION:  11/23/2014 DATE OF DISCHARGE:  11/27/2014  For a detailed note, please take a look at the history and physical done on admission by Dr. Myrtis Ser.   DIAGNOSES AT DISCHARGE AS FOLLOWS: Sepsis suspected, but now ruled out. Altered mental status secondary to metabolic encephalopathy, diabetes, diabetic neuropathy, hyperlipidemia, hypertension, depression, gastroesophageal reflux disease, benign prostatic hypertrophy, chronic obstructive pulmonary disease.   DIET: The patient is being discharged on a low-sodium, low-fat, carbohydrate-controlled diet, mechanical soft diet, medications pureed, strict aspiration precautions, fully upright for all oral intake.   ACTIVITY: Is as tolerated.   FOLLOWUP INSTRUCTIONS: Follow up with Dr. Juanell Fairly in the next 1 to 2 weeks.   DISCHARGE MEDICATIONS: Plavix 75 mg daily, fluconazole 150 mg weekly, gabapentin 800 mg 3 tabs at bedtime, allopurinol 100 mg at bedtime, Symbicort 2 puffs b.i.d., Dextromethorphan 10 mL q.12 h. as needed for cough, albuterol ipratropium nebulizer q.6 h. as needed, aspirin 81 mg daily, Imdur 60 mg daily, metoprolol succinate 100 mg daily, Zoloft 100 mg 2 tablets in the evening, atorvastatin 20 mg daily, Cepacol lozenge every 4 hours as needed, Flonase 2 sprays to each nostril daily, nystatin topical to be applied to the abdominal folds, groin and buttocks 3 times daily, guaifenesin 600 mg b.i.d., Protonix 40 mg daily, Flomax 0.4 mg daily, Novolin 70/30 at 30 units b.i.d., glucagon emergency kit for low blood sugar as needed, oxycodone 10 mg t.i.d., Xanax 0.5 mg q.8 h. as needed, gabapentin 800 mg 1 tab BID, and Augmentin 500 mg b.i.d. x 7 days.   CONSULTANTS DURING THE HOSPITAL COURSE: Dr. Rexene Edison from general surgery. Dr. Izora Gala Phifer, palliative care.   PERTINENT STUDIES DONE DURING THE HOSPITAL COURSE ARE AS FOLLOWS: A chest x-ray done  on admission showing bibasilar atelectasis in the lung volumes. A repeat chest x-ray done on January 16 showing a technically suboptimal examination due to patient positioning, persistent low lung volumes.   Blood cultures noted to be negative.   Urine cultures also noted to be negative.   HOSPITAL COURSE: This is a 69 year old male with medical problems as mentioned above, who presented to the hospital on 11/23/2014 due to fever, altered mental status and hypotension.   1. Sepsis. This was the initial working diagnosis on admission, as the patient presented with fever and hypotension. The patient was recently hospitalized for a urinary tract infection, was noted to have a multidrug resistant Klebsiella UTI, and discharged on IV Invanz, and has finished the course with IV Invanz for the UTI. He presented with some fever and hypotension again. Chest x-ray was suggestive of possible pneumonia. The patient was started on broad-spectrum IV antibiotics with vancomycin and Zosyn. The patient, although, did not have any worsening acute respiratory symptoms. After being on IV antibiotics for 48 hours, the patient has been afebrile. Blood cultures and urine cultures have remained negative. I have narrowed his antibiotics down from IV vancomycin and Zosyn to just oral Augmentin, which he currently is tolerating well with no further evidence of fever or hemodynamic compromise. Sepsis, therefore, has been treated and resolved now.  2. Altered mental status. This was thought to be secondary to metabolic encephalopathy from the sepsis. After being treated with IV antibiotics, the patient's mental status is now back to baseline.  3. Acute renal failure. This was secondary to ATN from the hypotension. After getting some gentle IV fluids, the patient's creatinine  is now back to baseline.  4. Obstructive sleep apnea. The patient was maintained on his CPAP. He will resume that.  5. Diabetes. The patient's blood sugars have  remained stable on some sliding scale insulin. The patient will resume his 70/30 insulin upon discharge.  6. Depression. The patient was maintained on his Zoloft. He will resume that.  7. Diabetic neuropathy. The patient was maintained on his Neurontin. He will resume that.  8. GERD. The patient was maintained on his Protonix. He will resume that.  9. Hypertension. The patient's antihypertensives were held due to some relative hypotension and sepsis when he was admitted, although his hemodynamics have improved. At present, I will resume his normal antihypertensives including Toprol and Imdur upon discharge.  10. Benign prostatic hypertrophy. The patient was maintained on his Flomax. He will resume that.  11. Anxiety/depression. The patient was maintained on his Xanax and Zoloft. He will resume that.  12. Gout. The patient had no acute gout attack. He will continue his allopurinol upon discharge.   CODE STATUS: The patient is a DNI/DNR.   DISPOSITION: He is being discharged to a skilled nursing facility for ongoing care. The family is interested in possible hospice services; therefore, the palliative care team should follow the patient at the skilled nursing facility for possible transition to hospice at a later time given the patient's poor prognosis.   TIME SPENT ON DISCHARGE: 40 minutes.    ____________________________ Belia Heman. Verdell Carmine, MD vjs:JT D: 11/27/2014 12:01:14 ET T: 11/27/2014 12:47:33 ET JOB#: [id number]  cc: Belia Heman. Verdell Carmine, MD, <Dictator> Hall Busing. Petra Kuba, MD Henreitta Leber MD ELECTRONICALLY SIGNED 12/06/2014 11:59

## 2015-04-27 IMAGING — CR DG CHEST 1V PORT
1 series · 1 of 1 positions shown · non-contrast
Comparison: 10/15/2014

CLINICAL DATA: Sepsis.

EXAM:
PORTABLE CHEST - 1 VIEW

[ap]
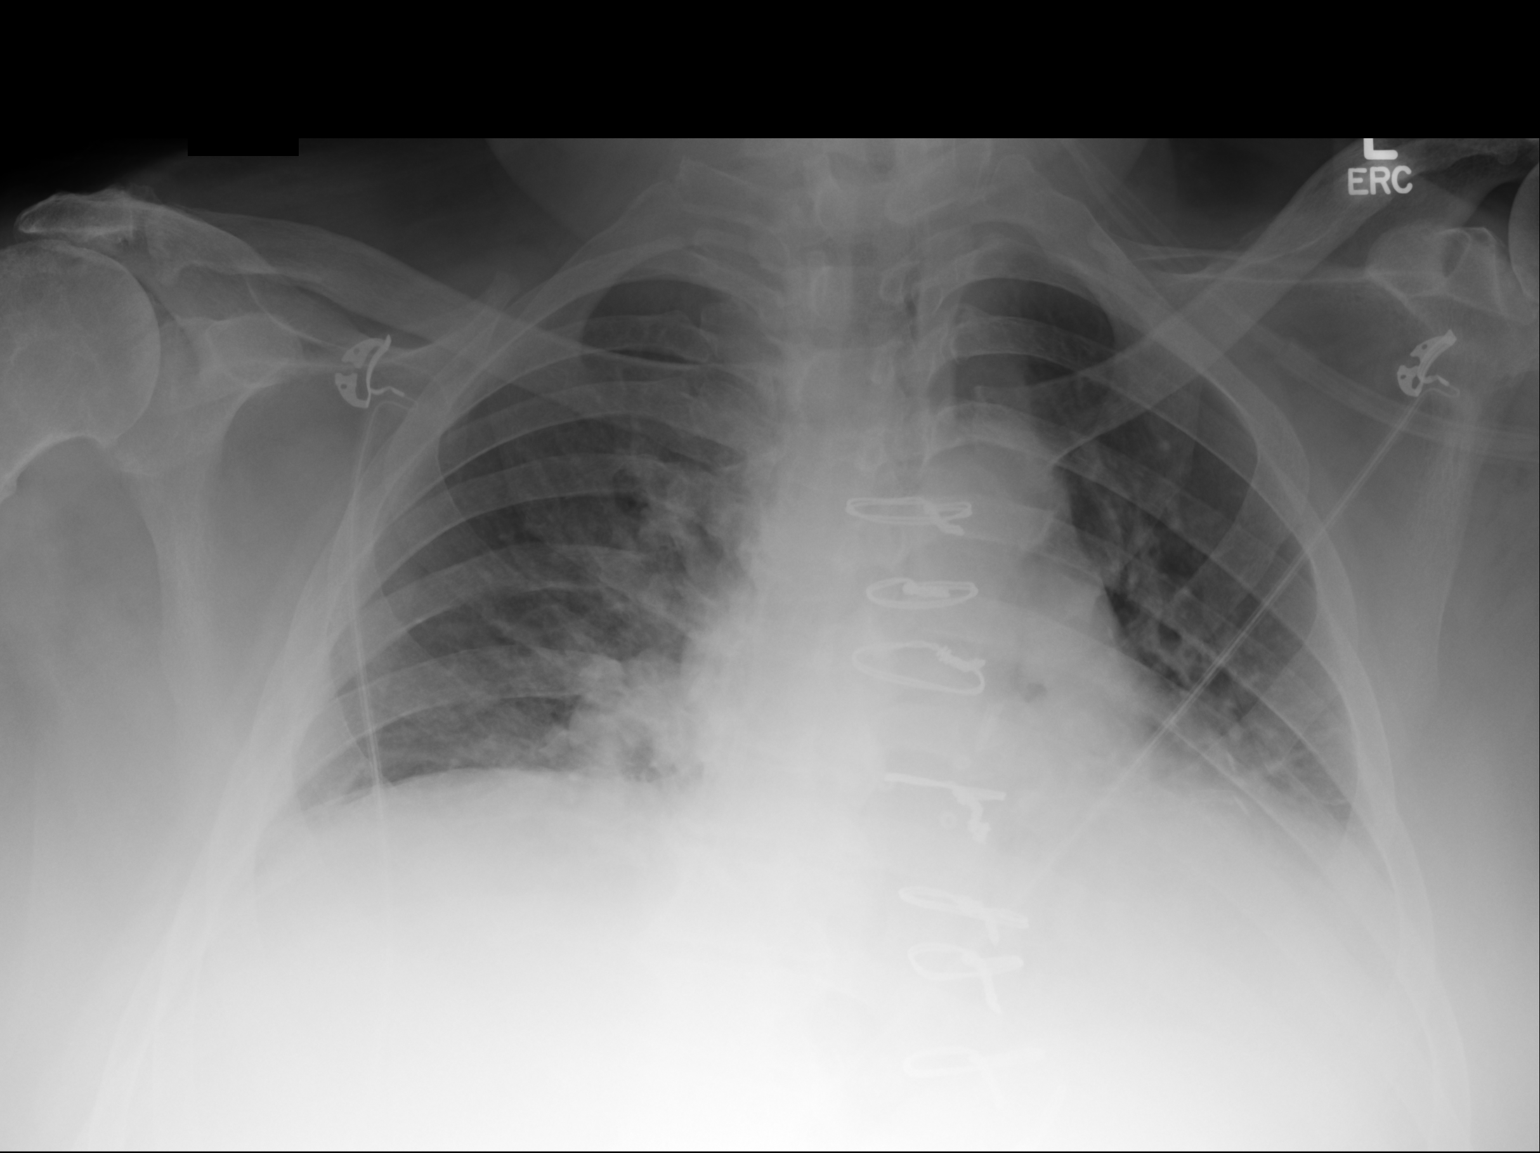

[1 of 1 positions shown; findings below may reference images not displayed]

FINDINGS: Postoperative change in the mediastinum. Shallow inspiration with
linear atelectasis in the lung bases. No blunting of costophrenic
angles. No pneumothorax. Calcified and tortuous aorta.
IMPRESSION: Shallow inspiration with atelectasis in the lung bases.

## 2015-07-06 ENCOUNTER — Encounter: Payer: Self-pay | Admitting: Urology

## 2015-07-07 ENCOUNTER — Ambulatory Visit: Payer: Self-pay | Admitting: Urology

## 2015-07-07 ENCOUNTER — Encounter: Payer: Self-pay | Admitting: Urology

## 2015-09-05 ENCOUNTER — Emergency Department

## 2015-09-05 ENCOUNTER — Encounter: Payer: Self-pay | Admitting: Emergency Medicine

## 2015-09-05 ENCOUNTER — Inpatient Hospital Stay
Admission: EM | Admit: 2015-09-05 | Discharge: 2015-09-07 | DRG: 872 | Disposition: A | Discharge status: Skilled Nursing Facility | Attending: Internal Medicine | Admitting: Internal Medicine

## 2015-09-05 DIAGNOSIS — I130 Hypertensive heart and chronic kidney disease with heart failure and stage 1 through stage 4 chronic kidney disease, or unspecified chronic kidney disease: Secondary | ICD-10-CM | POA: Diagnosis present

## 2015-09-05 DIAGNOSIS — Z955 Presence of coronary angioplasty implant and graft: Secondary | ICD-10-CM | POA: Diagnosis not present

## 2015-09-05 DIAGNOSIS — E1122 Type 2 diabetes mellitus with diabetic chronic kidney disease: Secondary | ICD-10-CM | POA: Diagnosis present

## 2015-09-05 DIAGNOSIS — L89322 Pressure ulcer of left buttock, stage 2: Secondary | ICD-10-CM | POA: Diagnosis present

## 2015-09-05 DIAGNOSIS — G4733 Obstructive sleep apnea (adult) (pediatric): Secondary | ICD-10-CM | POA: Diagnosis present

## 2015-09-05 DIAGNOSIS — E662 Morbid (severe) obesity with alveolar hypoventilation: Secondary | ICD-10-CM | POA: Diagnosis present

## 2015-09-05 DIAGNOSIS — I5032 Chronic diastolic (congestive) heart failure: Secondary | ICD-10-CM | POA: Diagnosis present

## 2015-09-05 DIAGNOSIS — A419 Sepsis, unspecified organism: Secondary | ICD-10-CM | POA: Diagnosis not present

## 2015-09-05 DIAGNOSIS — J449 Chronic obstructive pulmonary disease, unspecified: Secondary | ICD-10-CM | POA: Diagnosis present

## 2015-09-05 DIAGNOSIS — Z9049 Acquired absence of other specified parts of digestive tract: Secondary | ICD-10-CM

## 2015-09-05 DIAGNOSIS — Z7401 Bed confinement status: Secondary | ICD-10-CM | POA: Diagnosis not present

## 2015-09-05 DIAGNOSIS — I214 Non-ST elevation (NSTEMI) myocardial infarction: Secondary | ICD-10-CM

## 2015-09-05 DIAGNOSIS — Z951 Presence of aortocoronary bypass graft: Secondary | ICD-10-CM

## 2015-09-05 DIAGNOSIS — R52 Pain, unspecified: Secondary | ICD-10-CM

## 2015-09-05 DIAGNOSIS — I890 Lymphedema, not elsewhere classified: Secondary | ICD-10-CM | POA: Diagnosis present

## 2015-09-05 DIAGNOSIS — L97419 Non-pressure chronic ulcer of right heel and midfoot with unspecified severity: Secondary | ICD-10-CM | POA: Diagnosis present

## 2015-09-05 DIAGNOSIS — L03115 Cellulitis of right lower limb: Secondary | ICD-10-CM | POA: Diagnosis present

## 2015-09-05 DIAGNOSIS — Z6839 Body mass index (BMI) 39.0-39.9, adult: Secondary | ICD-10-CM

## 2015-09-05 DIAGNOSIS — Z9889 Other specified postprocedural states: Secondary | ICD-10-CM | POA: Diagnosis not present

## 2015-09-05 DIAGNOSIS — J9610 Chronic respiratory failure, unspecified whether with hypoxia or hypercapnia: Secondary | ICD-10-CM | POA: Diagnosis present

## 2015-09-05 DIAGNOSIS — N179 Acute kidney failure, unspecified: Secondary | ICD-10-CM | POA: Diagnosis present

## 2015-09-05 DIAGNOSIS — E785 Hyperlipidemia, unspecified: Secondary | ICD-10-CM | POA: Diagnosis present

## 2015-09-05 DIAGNOSIS — N400 Enlarged prostate without lower urinary tract symptoms: Secondary | ICD-10-CM | POA: Diagnosis present

## 2015-09-05 DIAGNOSIS — I2510 Atherosclerotic heart disease of native coronary artery without angina pectoris: Secondary | ICD-10-CM | POA: Diagnosis present

## 2015-09-05 DIAGNOSIS — E860 Dehydration: Secondary | ICD-10-CM | POA: Diagnosis present

## 2015-09-05 DIAGNOSIS — Z66 Do not resuscitate: Secondary | ICD-10-CM | POA: Diagnosis present

## 2015-09-05 DIAGNOSIS — L0390 Cellulitis, unspecified: Secondary | ICD-10-CM

## 2015-09-05 DIAGNOSIS — N189 Chronic kidney disease, unspecified: Secondary | ICD-10-CM | POA: Diagnosis present

## 2015-09-05 DIAGNOSIS — K219 Gastro-esophageal reflux disease without esophagitis: Secondary | ICD-10-CM | POA: Diagnosis present

## 2015-09-05 DIAGNOSIS — Z87891 Personal history of nicotine dependence: Secondary | ICD-10-CM | POA: Diagnosis not present

## 2015-09-05 DIAGNOSIS — M109 Gout, unspecified: Secondary | ICD-10-CM | POA: Diagnosis present

## 2015-09-05 DIAGNOSIS — R6510 Systemic inflammatory response syndrome (SIRS) of non-infectious origin without acute organ dysfunction: Secondary | ICD-10-CM

## 2015-09-05 HISTORY — DX: Type 2 diabetes mellitus without complications: E11.9

## 2015-09-05 HISTORY — DX: Gout, unspecified: M10.9

## 2015-09-05 HISTORY — DX: Chronic kidney disease, unspecified: N18.9

## 2015-09-05 LAB — URINALYSIS COMPLETE WITH MICROSCOPIC (ARMC ONLY)
Bilirubin Urine: NEGATIVE
GLUCOSE, UA: NEGATIVE mg/dL
HGB URINE DIPSTICK: NEGATIVE
Ketones, ur: NEGATIVE mg/dL
Leukocytes, UA: NEGATIVE
Nitrite: NEGATIVE
Protein, ur: NEGATIVE mg/dL
Specific Gravity, Urine: 1.014 (ref 1.005–1.030)
pH: 5 (ref 5.0–8.0)

## 2015-09-05 LAB — BASIC METABOLIC PANEL
Anion gap: 9 (ref 5–15)
BUN: 51 mg/dL — AB (ref 6–20)
CHLORIDE: 98 mmol/L — AB (ref 101–111)
CO2: 33 mmol/L — AB (ref 22–32)
CREATININE: 2.29 mg/dL — AB (ref 0.61–1.24)
Calcium: 8.9 mg/dL (ref 8.9–10.3)
GFR calc Af Amer: 32 mL/min — ABNORMAL LOW (ref >60)
GFR calc non Af Amer: 27 mL/min — ABNORMAL LOW (ref >60)
GLUCOSE: 146 mg/dL — AB (ref 65–99)
Potassium: 4.3 mmol/L (ref 3.5–5.1)
Sodium: 140 mmol/L (ref 135–145)

## 2015-09-05 LAB — CBC
HCT: 33.9 % — ABNORMAL LOW (ref 40.0–52.0)
Hemoglobin: 11.2 g/dL — ABNORMAL LOW (ref 13.0–18.0)
MCH: 29 pg (ref 26.0–34.0)
MCHC: 33.1 g/dL (ref 32.0–36.0)
MCV: 87.6 fL (ref 80.0–100.0)
PLATELETS: 102 10*3/uL — AB (ref 150–440)
RBC: 3.87 MIL/uL — ABNORMAL LOW (ref 4.40–5.90)
RDW: 14.6 % — AB (ref 11.5–14.5)
WBC: 9.7 10*3/uL (ref 3.8–10.6)

## 2015-09-05 LAB — HEPATIC FUNCTION PANEL
ALBUMIN: 3.1 g/dL — AB (ref 3.5–5.0)
ALK PHOS: 42 U/L (ref 38–126)
ALT: 6 U/L — ABNORMAL LOW (ref 17–63)
AST: 16 U/L (ref 15–41)
Bilirubin, Direct: 0.2 mg/dL (ref 0.1–0.5)
Indirect Bilirubin: 0.4 mg/dL (ref 0.3–0.9)
TOTAL PROTEIN: 6.1 g/dL — AB (ref 6.5–8.1)
Total Bilirubin: 0.6 mg/dL (ref 0.3–1.2)

## 2015-09-05 LAB — LACTIC ACID, PLASMA
Lactic Acid, Venous: 1.6 mmol/L (ref 0.5–2.0)
Lactic Acid, Venous: 1.3 mmol/L (ref 0.5–2.0)

## 2015-09-05 LAB — GLUCOSE, CAPILLARY
GLUCOSE-CAPILLARY: 147 mg/dL — AB (ref 65–99)
Glucose-Capillary: 155 mg/dL — ABNORMAL HIGH (ref 65–99)

## 2015-09-05 LAB — TROPONIN I: TROPONIN I: 0.05 ng/mL — AB (ref <0.031)

## 2015-09-05 MED ORDER — ONDANSETRON HCL 4 MG/2ML IJ SOLN: 4.0000 mg | Freq: Four times a day (QID) | INTRAMUSCULAR | Status: DC | PRN

## 2015-09-05 MED ORDER — DEXTROMETHORPHAN POLISTIREX ER 30 MG/5ML PO SUER
60.0000 mg | Freq: Two times a day (BID) | ORAL | Status: DC | PRN
Start: 2015-09-05 — End: 2015-09-05
  Filled 2015-09-05: qty 10

## 2015-09-05 MED ORDER — SERTRALINE HCL 50 MG PO TABS
150.0000 mg | ORAL_TABLET | Freq: Every day | ORAL | Status: DC
  Administered 2015-09-05 – 2015-09-07 (×3): 150 mg via ORAL
  Filled 2015-09-05 (×4): qty 3

## 2015-09-05 MED ORDER — FLUCONAZOLE 100 MG PO TABS
150.0000 mg | ORAL_TABLET | ORAL | Status: DC
  Administered 2015-09-07: 10:00:00 150 mg via ORAL
  Filled 2015-09-05 (×2): qty 1

## 2015-09-05 MED ORDER — COLLAGENASE 250 UNIT/GM EX OINT
1.0000 "application " | TOPICAL_OINTMENT | Freq: Every day | CUTANEOUS | Status: DC
  Administered 2015-09-06 (×2): 1 via TOPICAL
  Filled 2015-09-05: qty 30

## 2015-09-05 MED ORDER — ALLOPURINOL 100 MG PO TABS
100.0000 mg | ORAL_TABLET | Freq: Every day | ORAL | Status: DC
  Administered 2015-09-05 – 2015-09-06 (×2): 100 mg via ORAL
  Filled 2015-09-05 (×2): qty 1

## 2015-09-05 MED ORDER — ACETAMINOPHEN 650 MG RE SUPP: 650.0000 mg | Freq: Four times a day (QID) | RECTAL | Status: DC | PRN

## 2015-09-05 MED ORDER — GABAPENTIN 300 MG PO CAPS
400.0000 mg | ORAL_CAPSULE | Freq: Three times a day (TID) | ORAL | Status: DC
  Administered 2015-09-05 – 2015-09-07 (×6): 400 mg via ORAL
  Filled 2015-09-05 (×11): qty 1

## 2015-09-05 MED ORDER — ALPRAZOLAM 0.25 MG PO TABS: 0.2500 mg | ORAL_TABLET | Freq: Three times a day (TID) | ORAL | Status: DC | PRN

## 2015-09-05 MED ORDER — INSULIN ASPART 100 UNIT/ML ~~LOC~~ SOLN
0.0000 [IU] | Freq: Three times a day (TID) | SUBCUTANEOUS | Status: DC
  Administered 2015-09-06 – 2015-09-07 (×2): 1 [IU] via SUBCUTANEOUS
  Filled 2015-09-05: qty 1

## 2015-09-05 MED ORDER — SODIUM CHLORIDE 0.9 % IV SOLN
INTRAVENOUS | Status: DC
  Administered 2015-09-05 – 2015-09-06 (×3): via INTRAVENOUS

## 2015-09-05 MED ORDER — MORPHINE SULFATE (PF) 2 MG/ML IV SOLN: 2.0000 mg | INTRAVENOUS | Status: DC | PRN

## 2015-09-05 MED ORDER — PIPERACILLIN-TAZOBACTAM 3.375 G IVPB
3.3750 g | Freq: Three times a day (TID) | INTRAVENOUS | Status: DC
  Filled 2015-09-05 (×3): qty 50

## 2015-09-05 MED ORDER — INSULIN ASPART 100 UNIT/ML ~~LOC~~ SOLN
0.0000 [IU] | Freq: Every day | SUBCUTANEOUS | Status: DC
  Filled 2015-09-05: qty 1

## 2015-09-05 MED ORDER — ASPIRIN 81 MG PO CHEW: 324.0000 mg | CHEWABLE_TABLET | Freq: Once | ORAL | Status: DC

## 2015-09-05 MED ORDER — ACETAMINOPHEN 325 MG PO TABS: 650.0000 mg | ORAL_TABLET | Freq: Four times a day (QID) | ORAL | Status: DC | PRN

## 2015-09-05 MED ORDER — ASPIRIN EC 81 MG PO TBEC: 81.0000 mg | DELAYED_RELEASE_TABLET | Freq: Once | ORAL | Status: DC

## 2015-09-05 MED ORDER — MORPHINE SULFATE (PF) 2 MG/ML IV SOLN
2.0000 mg | INTRAVENOUS | Status: AC
  Administered 2015-09-05: 2 mg via INTRAVENOUS
  Filled 2015-09-05: qty 1

## 2015-09-05 MED ORDER — GUAIFENESIN ER 600 MG PO TB12
600.0000 mg | ORAL_TABLET | Freq: Two times a day (BID) | ORAL | Status: DC
  Administered 2015-09-05 – 2015-09-07 (×4): 600 mg via ORAL
  Filled 2015-09-05 (×4): qty 1

## 2015-09-05 MED ORDER — INSULIN ASPART PROT & ASPART (70-30 MIX) 100 UNIT/ML ~~LOC~~ SUSP
15.0000 [IU] | Freq: Every day | SUBCUTANEOUS | Status: DC
  Administered 2015-09-07: 08:00:00 15 [IU] via SUBCUTANEOUS
  Filled 2015-09-05: qty 15

## 2015-09-05 MED ORDER — ASPIRIN 325 MG PO TABS
ORAL_TABLET | ORAL | Status: AC
  Administered 2015-09-05: 325 mg
  Filled 2015-09-05: qty 1

## 2015-09-05 MED ORDER — VANCOMYCIN HCL IN DEXTROSE 1-5 GM/200ML-% IV SOLN
1000.0000 mg | INTRAVENOUS | Status: DC
  Administered 2015-09-05 – 2015-09-06 (×2): 1000 mg via INTRAVENOUS
  Filled 2015-09-05 (×4): qty 200

## 2015-09-05 MED ORDER — ASPIRIN EC 325 MG PO TBEC: 325.0000 mg | DELAYED_RELEASE_TABLET | Freq: Once | ORAL | Status: DC

## 2015-09-05 MED ORDER — DEXTROMETHORPHAN POLISTIREX ER 30 MG/5ML PO SUER
60.0000 mg | Freq: Two times a day (BID) | ORAL | Status: DC | PRN
  Filled 2015-09-05: qty 10

## 2015-09-05 MED ORDER — LORATADINE 10 MG PO TABS
10.0000 mg | ORAL_TABLET | Freq: Every day | ORAL | Status: DC
  Administered 2015-09-05 – 2015-09-07 (×3): 10 mg via ORAL
  Filled 2015-09-05 (×3): qty 1

## 2015-09-05 MED ORDER — NYSTATIN 100000 UNIT/GM EX POWD
1.0000 g | Freq: Three times a day (TID) | CUTANEOUS | Status: DC
  Administered 2015-09-05 – 2015-09-07 (×5): 1 g via TOPICAL
  Filled 2015-09-05: qty 15

## 2015-09-05 MED ORDER — PIPERACILLIN-TAZOBACTAM 3.375 G IVPB
3.3750 g | Freq: Once | INTRAVENOUS | Status: AC
  Administered 2015-09-05: 3.375 g via INTRAVENOUS
  Filled 2015-09-05: qty 50

## 2015-09-05 MED ORDER — DIVALPROEX SODIUM 250 MG PO DR TAB
250.0000 mg | DELAYED_RELEASE_TABLET | Freq: Two times a day (BID) | ORAL | Status: DC
  Administered 2015-09-05 – 2015-09-07 (×4): 250 mg via ORAL
  Filled 2015-09-05 (×4): qty 1

## 2015-09-05 MED ORDER — PANTOPRAZOLE SODIUM 40 MG PO TBEC
40.0000 mg | DELAYED_RELEASE_TABLET | Freq: Two times a day (BID) | ORAL | Status: DC
Start: 2015-09-05 — End: 2015-09-07
  Administered 2015-09-05 – 2015-09-07 (×4): 40 mg via ORAL
  Filled 2015-09-05 (×4): qty 1

## 2015-09-05 MED ORDER — METOPROLOL TARTRATE 50 MG PO TABS
50.0000 mg | ORAL_TABLET | Freq: Every day | ORAL | Status: DC
  Administered 2015-09-05 – 2015-09-07 (×3): 50 mg via ORAL
  Filled 2015-09-05 (×3): qty 1

## 2015-09-05 MED ORDER — TAMSULOSIN HCL 0.4 MG PO CAPS
0.4000 mg | ORAL_CAPSULE | Freq: Every day | ORAL | Status: DC
  Administered 2015-09-05 – 2015-09-07 (×3): 0.4 mg via ORAL
  Filled 2015-09-05 (×3): qty 1

## 2015-09-05 MED ORDER — IPRATROPIUM-ALBUTEROL 0.5-2.5 (3) MG/3ML IN SOLN
3.0000 mL | Freq: Two times a day (BID) | RESPIRATORY_TRACT | Status: DC
  Administered 2015-09-05 – 2015-09-07 (×4): 3 mL via RESPIRATORY_TRACT
  Filled 2015-09-05 (×4): qty 3

## 2015-09-05 MED ORDER — FLUTICASONE PROPIONATE 50 MCG/ACT NA SUSP
1.0000 | Freq: Every day | NASAL | Status: DC
  Administered 2015-09-05 – 2015-09-07 (×3): 1 via NASAL
  Filled 2015-09-05: qty 16

## 2015-09-05 MED ORDER — HYDROCODONE-ACETAMINOPHEN 5-325 MG PO TABS
1.0000 | ORAL_TABLET | ORAL | Status: DC | PRN
  Administered 2015-09-06 – 2015-09-07 (×2): 1 via ORAL
  Administered 2015-09-07: 2 via ORAL
  Filled 2015-09-05 (×2): qty 1

## 2015-09-05 MED ORDER — TRAZODONE HCL 50 MG PO TABS
25.0000 mg | ORAL_TABLET | Freq: Every day | ORAL | Status: DC
  Administered 2015-09-05 – 2015-09-06 (×2): 25 mg via ORAL
  Filled 2015-09-05 (×2): qty 1

## 2015-09-05 MED ORDER — IPRATROPIUM-ALBUTEROL 0.5-2.5 (3) MG/3ML IN SOLN: 3.0000 mL | Freq: Two times a day (BID) | RESPIRATORY_TRACT | Status: DC

## 2015-09-05 MED ORDER — SODIUM CHLORIDE 0.9 % IV BOLUS (SEPSIS)
1000.0000 mL | Freq: Once | INTRAVENOUS | Status: AC
  Administered 2015-09-05: 1000 mL via INTRAVENOUS

## 2015-09-05 MED ORDER — POLYETHYLENE GLYCOL 3350 17 G PO PACK: 17.0000 g | PACK | Freq: Every day | ORAL | Status: DC | PRN

## 2015-09-05 MED ORDER — PIPERACILLIN-TAZOBACTAM 4.5 G IVPB
4.5000 g | Freq: Three times a day (TID) | INTRAVENOUS | Status: DC
  Administered 2015-09-05 – 2015-09-07 (×5): 4.5 g via INTRAVENOUS
  Filled 2015-09-05 (×7): qty 100

## 2015-09-05 MED ORDER — HEPARIN SODIUM (PORCINE) 5000 UNIT/ML IJ SOLN
5000.0000 [IU] | Freq: Three times a day (TID) | INTRAMUSCULAR | Status: DC
  Administered 2015-09-05 – 2015-09-07 (×5): 5000 [IU] via SUBCUTANEOUS
  Filled 2015-09-05 (×5): qty 1

## 2015-09-05 MED ORDER — VANCOMYCIN HCL IN DEXTROSE 1-5 GM/200ML-% IV SOLN
1000.0000 mg | Freq: Once | INTRAVENOUS | Status: AC
  Administered 2015-09-05: 1000 mg via INTRAVENOUS
  Filled 2015-09-05: qty 200

## 2015-09-05 MED ORDER — INSULIN ASPART PROT & ASPART (70-30 MIX) 100 UNIT/ML ~~LOC~~ SUSP
25.0000 [IU] | Freq: Every day | SUBCUTANEOUS | Status: DC
  Administered 2015-09-05: 25 [IU] via SUBCUTANEOUS
  Filled 2015-09-05: qty 25

## 2015-09-05 MED ORDER — LOPERAMIDE HCL 2 MG PO CAPS: 2.0000 mg | ORAL_CAPSULE | ORAL | Status: DC | PRN

## 2015-09-05 MED ORDER — ONDANSETRON HCL 4 MG PO TABS: 4.0000 mg | ORAL_TABLET | Freq: Four times a day (QID) | ORAL | Status: DC | PRN

## 2015-09-05 NOTE — ED Provider Notes (Addendum)
[facility] Emergency Department Provider Note  ____________________________________________  Time seen: Approximately 12:46 PM  I have reviewed the triage vital signs and the nursing notes.   HISTORY  Chief Complaint Tremors    HPI [name] is a 69 y.o. male diastolic congestive heart failure, obesity hypoventilation syndrome, obstructive sleep apnea, hypertension, type 2 diabetes, and coronary artery disease, status post coronary artery bypass grafting who presents from [facility] for fever and AMS which began gradually last night and has been constant but waxing and waning. He denies any pain complaints. No cough, sneezing, runny nose, congestion, vomiting, diarrhea, fevers or chills. Current severity of illness is moderate. No modifying factors.   Past Medical History  Diagnosis Date  . Bacteremia   . GERD (gastroesophageal reflux disease)   . Hematuria   . Urinary incontinence   . Sleep apnea     on CPAP  . Hyperlipemia   . 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 COPD (chronic obstructive pulmonary disease) (HCC)   . Hypertension   . Lymphedema   . Anxiety disorder   . CHF (congestive heart failure) (HCC)     diastolic dysfunction, last ECHO from 2015, EF 50-55%  . Muscle weakness   . Morbid obesity (HCC)   . Difficulty in walking     bedbound  . Chronic respiratory failure (HCC)     on 2l oxygen continuous  . Diabetes mellitus (HCC)   . Gout   . CKD (chronic kidney disease)     Patient Active Problem List   Diagnosis Date Noted  . Sepsis (HCC) 09/05/2015    Past Surgical History  Procedure Laterality Date  . Coronary angioplasty with stent placement    . Hernia repair      hiatal hernia repair  . Coronary artery bypass graft    . Appendectomy      Current Outpatient Rx  Name  Route  Sig  Dispense  Refill  . acetaminophen (TYLENOL) 500 MG tablet   Oral   Take 1,000  mg by mouth every 4 (four) hours as needed for fever.         Marland Kitchen allopurinol (ZYLOPRIM) 100 MG tablet   Oral   Take 100 mg by mouth at bedtime.         . ALPRAZolam (XANAX) 0.25 MG tablet   Oral   Take 0.25 mg by mouth every 8 (eight) hours as needed for anxiety.         . collagenase (SANTYL) ointment   Topical   Apply 1 application topically daily. Pt applies to right heel.         Marland Kitchen dextromethorphan (DELSYM) 30 MG/5ML liquid   Oral   Take 60 mg by mouth every 12 (twelve) hours as needed for cough.         . divalproex (DEPAKOTE) 125 MG DR tablet   Oral   Take 250 mg by mouth 2 (two) times daily.         . fluconazole (DIFLUCAN) 150 MG tablet   Oral   Take 150 mg by mouth once a week. Pt takes on Thursday.         . fluticasone (FLONASE) 50 MCG/ACT nasal spray   Each Nare   Place 1 spray into both nostrils daily.         . furosemide (LASIX) 20 MG tablet   Oral   Take 60 mg by mouth 2 (two)  times daily.         Marland Kitchen gabapentin (NEURONTIN) 400 MG capsule   Oral   Take 400 mg by mouth 3 (three) times daily.         Marland Kitchen guaiFENesin (MUCINEX) 600 MG 12 hr tablet   Oral   Take 600 mg by mouth every 12 (twelve) hours.         . insulin NPH-regular Human (NOVOLIN 70/30) (70-30) 100 UNIT/ML injection   Subcutaneous   Inject 15-25 Units into the skin 2 (two) times daily. Pt uses 15 units in the morning and 25 units at bedtime.         Marland Kitchen ipratropium-albuterol (DUONEB) 0.5-2.5 (3) MG/3ML SOLN   Nebulization   Take 3 mLs by nebulization 2 (two) times daily.         Marland Kitchen loperamide (IMODIUM) 2 MG capsule   Oral   Take 2 mg by mouth every 4 (four) hours as needed for diarrhea or loose stools.         Marland Kitchen loratadine (CLARITIN) 10 MG tablet   Oral   Take 10 mg by mouth daily.         . magnesium hydroxide (MILK OF MAGNESIA) 400 MG/5ML suspension   Oral   Take 30 mLs by mouth daily as needed for mild constipation.         . Menthol, Topical  Analgesic, (BIOFREEZE EX)   Apply externally   Apply 1 application topically as needed (for pain).         . metoprolol (LOPRESSOR) 50 MG tablet   Oral   Take 50 mg by mouth daily.         Marland Kitchen nystatin (MYCOSTATIN/NYSTOP) 100000 UNIT/GM POWD   Topical   Apply 1 g topically 3 (three) times daily.         Marland Kitchen omeprazole (PRILOSEC) 20 MG capsule   Oral   Take 20 mg by mouth 2 (two) times daily.         Marland Kitchen oxyCODONE (OXY IR/ROXICODONE) 5 MG immediate release tablet   Oral   Take 10 mg by mouth 3 (three) times daily. Pt is also able to use every four hours PRN.         . sertraline (ZOLOFT) 100 MG tablet   Oral   Take 150 mg by mouth daily.         . tamsulosin (FLOMAX) 0.4 MG CAPS capsule   Oral   Take 0.4 mg by mouth daily.         . traZODone (DESYREL) 25 mg TABS tablet   Oral   Take 25 mg by mouth at bedtime.           Allergies Review of patient's allergies indicates no known allergies.  Family History  Problem Relation Age of Onset  . Kidney disease Neg Hx   . Prostate cancer Neg Hx     Social History Social History  Substance Use Topics  . Smoking status: Former Games developer  . Smokeless tobacco: None  . Alcohol Use: No    Review of Systems Constitutional: +fever/chills Eyes: No visual changes. ENT: No sore throat. Cardiovascular: Denies chest pain. Respiratory: Denies shortness of breath. Gastrointestinal: No abdominal pain.  No nausea, no vomiting.  No diarrhea.  No constipation. Genitourinary: Negative for dysuria. Musculoskeletal: Negative for back pain. Skin: Negative for rash. Neurological: Negative for headaches, focal weakness or numbness.  10-point ROS otherwise negative.  ____________________________________________   PHYSICAL EXAM:  VITAL SIGNS: ED  Triage Vitals  Enc Vitals Group     BP 09/05/15 1200 144/85 mmHg     Pulse Rate 09/05/15 1200 109     Resp 09/05/15 1200 22     Temp 09/05/15 1200 98.3 F (36.8 C)     Temp  Source 09/05/15 1200 Oral     SpO2 09/05/15 1200 95 %     Weight 09/05/15 1200 73 lb 13.7 oz (33.5 kg)     Height 09/05/15 1200  (1.778 m)     Head Cir --      Peak Flow --      Pain Score --      Pain Loc --      Pain Edu? --      Excl. in GC? --     Constitutional: Alert and oriented. Chronically ill-appearing and in no acute distress. Eyes: Conjunctivae are normal. PERRL. EOMI. Head: Atraumatic. Nose: No congestion/rhinnorhea. Mouth/Throat: Mucous membranes are dry.  Oropharynx non-erythematous. Neck: No stridor.  Cardiovascular: tachycardic rate, regular rhythm. Grossly normal heart sounds.  Good peripheral circulation. Respiratory: Normal respiratory effort.  No retractions. Lungs CTAB. Tachypneic Gastrointestinal: Soft and nontender. No distention. No CVA tenderness. Genitourinary: deferred Musculoskeletal: Chronic venous stasis color change of bilateral lower extremities. There is a tender pressure ulcer on the right heel. There is a stage I sacral decubitus rash or ulcer without drainage. Neurologic:  Normal speech and language. No gross focal neurologic deficits are appreciated. No gait instability. Skin:  Skin is warm, dry and intact. No rash noted. There is a small firm uncle on the back of the neck draining purulent fluid but there is no surrounding erythema, no induration, no fluctuance. Psychiatric: Mood and affect are normal. Speech and behavior are normal.  ____________________________________________   LABS (all labs ordered are listed, but only abnormal results are displayed)  Labs Reviewed  BASIC METABOLIC PANEL - Abnormal; Notable for the following:    Chloride 98 (*)    CO2 33 (*)    Glucose, Bld 146 (*)    BUN 51 (*)    Creatinine, Ser 2.29 (*)    GFR calc non Af Amer 27 (*)    GFR calc Af Amer 32 (*)    All other components within normal limits  CBC - Abnormal; Notable for the following:    RBC 3.87 (*)    Hemoglobin 11.2 (*)    HCT 33.9 (*)     RDW 14.6 (*)    Platelets 102 (*)    All other components within normal limits  URINALYSIS COMPLETEWITH MICROSCOPIC ([facility] ONLY) - Abnormal; Notable for the following:    Color, Urine YELLOW (*)    APPearance CLEAR (*)    Bacteria, UA RARE (*)    Squamous Epithelial / LPF 0-5 (*)    All other components within normal limits  HEPATIC FUNCTION PANEL - Abnormal; Notable for the following:    Total Protein 6.1 (*)    Albumin 3.1 (*)    ALT 6 (*)    All other components within normal limits  TROPONIN I - Abnormal; Notable for the following:    Troponin I 0.05 (*)    All other components within normal limits  CULTURE, BLOOD (ROUTINE X 2)  CULTURE, BLOOD (ROUTINE X 2)  URINE CULTURE  LACTIC ACID, PLASMA  LACTIC ACID, PLASMA  CBG MONITORING, ED   ____________________________________________  EKG ED ECG REPORT I, Gayla Doss, the attending physician, personally viewed and interpreted this ECG.  Date: 09/05/2015  EKG Time: 14:53  Rate: 79  Rhythm: sinus rhythm with frequent PVCs  Axis: normal  Intervals:right bundle branch block  ST&T Change: no acute ST elevation.    ____________________________________________  RADIOLOGY  CXR IMPRESSION: No edema or consolidation. Heart prominent but stable. Stable elevation of the right hemidiaphragm.  ____________________________________________   PROCEDURES  Procedure(s) performed: None  Critical Care performed: Yes, see critical care note(s). Total critical care time spent 30 minutes.  ____________________________________________   INITIAL IMPRESSION / ASSESSMENT AND PLAN / ED COURSE  Pertinent labs & imaging results that were available during my care of the patient were reviewed by me and considered in my medical decision making (see chart for details).  [name] is a 69 y.o. male diastolic congestive heart failure, obesity hypoventilation syndrome, obstructive sleep apnea, hypertension, type 2 diabetes, and  coronary artery disease, status post coronary artery bypass grafting who presents from [facility] for fever and AMS. On exam, he is chronically ill-appearing. He is tachycardic and tachypnea meeting 2 out of 4 Sirs criteria. IV vancomycin and Zosyn given as well as IV fluids. Will not give full 30 mL/kg IV fluid bolus given that the patient has a history of CHF and is at increased risk for flash coronary edema. Additionally he is DO NOT RESUSCITATE and there would be no way to salvage the situation with intubation if he were to have flash bone edema. Labs reviewed. No leukocytosis. Mild anemia with hemoglobin 11.2. BUN/creatinine elevated, suspect prerenal etiology, continue IV fluids. They signed creatinine appears to be closer to 1.8. Urinalysis is not consistent with urinary tract infection. Venous lactic acid is 1.6. Troponin slightly elevated 0.05. We'll give aspirin. Chest x-ray not consistent with pneumonia. X-ray of the right heel is pending to evaluate for any evidence of osteomyelitis though his pressure ulcer on the heel could be the source of infection in the absence of osteo. Case discussed with hospitalist for admission at 3:16 PM.   ____________________________________________   FINAL CLINICAL IMPRESSION(S) / ED DIAGNOSES  Final diagnoses:  Pain  SIRS (systemic inflammatory response syndrome) (HCC)  NSTEMI (non-ST elevated myocardial infarction) (HCC)      Gayla DossEryka A Courtney Fenlon, MD 09/05/15 1549  Gayla DossEryka A Aryiah Monterosso, MD 09/05/15 1550

## 2015-09-05 NOTE — Plan of Care (Signed)
Problem: Discharge Progression Outcomes Goal: Barriers To Progression Addressed/Resolved Outcome: Not Progressing Individualization of care Pt resides at [facility]  Wife at home  Wheelchair bound Wears o2 / bipap at hs uses o2  More though Goal: Pain controlled with appropriate interventions Outcome: Progressing Pt uses  Oxycodone at home for pain Goal: Hemodynamically stable Outcome: Progressing Chronic cellulitis of both legs rt  Heel  ulcer Goal: Tolerating diet Outcome: Progressing tol diet well. Goal: Activity appropriate for discharge plan Outcome: Not Progressing Pt on strict bedrest for now  Goal: Home Health Care arrangements in place Outcome: Progressing For wound care consult Goal: Wound improving/decreased edema Outcome: Not Progressing Rt heel  Wd care nurse to see

## 2015-09-05 NOTE — Progress Notes (Signed)
ED visit made. Patient is followed by [facility]  at [facility] with a hospice diagnosis of COPD and is a DNR.  Notified by patient's hospice nurse that patient was having increased pain to his lower right extremity, of note he has an unstageable wound to his right heel, he was having periods of unresponsiveness and lethargy as well as an increased temperature.  Patient seen in the ED, lying on the stretcher, alert and interactive. Xray technician present to perform right foot xray, patient receiving IV fluids and has been started on broad spectrum antibiotics.  Patient also reports difficulty urinating today. Urinalysis and wound cultures pending. Per chart note review and conversation with patient's sister Corrie DandyMary who was present, plan is for patient to be admitted for treatment. Hospice team updated. Will continue to follow through discharge. Dayna BarkerKaren Robertson RN, [name], [facility], [facility] L[facility] [phone] c

## 2015-09-05 NOTE — Progress Notes (Signed)
ANTIBIOTIC CONSULT NOTE - INITIAL  Pharmacy Consult for vancomycin and piperacillin/tazobactam  Indication: rule out sepsis  No Known Allergies  Patient Measurements: Height:  (177.8 cm) Weight: 274 lb (124.286 kg) IBW/kg (Calculated) : 73 Adjusted Body Weight: 93 kg  Vital Signs: Temp: 98.2 F (36.8 C) (10/25 1726) Temp Source: Oral (10/25 1726) BP: 137/54 mmHg (10/25 1726) Pulse Rate: 74 (10/25 1726) Intake/Output from previous day:   Intake/Output from this shift: Total I/O In: -  Out: 1400 [Urine:1400]  Labs:  Recent Labs  09/05/15 1218  WBC 9.7  HGB 11.2*  PLT 102*  CREATININE 2.29*   Estimated Creatinine Clearance: 39.7 mL/min (by C-G formula based on Cr of 2.29). No results for input(s): VANCOTROUGH, VANCOPEAK, VANCORANDOM, GENTTROUGH, GENTPEAK, GENTRANDOM, TOBRATROUGH, TOBRAPEAK, TOBRARND, AMIKACINPEAK, AMIKACINTROU, AMIKACIN in the last 72 hours.   Microbiology: No results found for this or any previous visit (from the past 720 hour(s)).  Medical History: Past Medical History  Diagnosis Date  . Bacteremia   . GERD (gastroesophageal reflux disease)   . Hematuria   . Urinary incontinence   . Sleep apnea     on CPAP  . Hyperlipemia   . 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 COPD (chronic obstructive pulmonary disease) (HCC)   . Hypertension   . Lymphedema   . Anxiety disorder   . CHF (congestive heart failure) (HCC)     diastolic dysfunction, last ECHO from 2015, EF 50-55%  . Muscle weakness   . Morbid obesity (HCC)   . Difficulty in walking     bedbound  . Chronic respiratory failure (HCC)     on 2l oxygen continuous  . Diabetes mellitus (HCC)   . Gout   . CKD (chronic kidney disease)     Medications:  Anti-infectives    Start     Dose/Rate Route Frequency Ordered Stop   09/07/15 1000  fluconazole (DIFLUCAN) tablet 150 mg     150 mg Oral Every Thu 09/05/15 1729     09/05/15 2200   piperacillin-tazobactam (ZOSYN) IVPB 3.375 g  Status:  Discontinued     3.375 g 12.5 mL/hr over 240 Minutes Intravenous 3 times per day 09/05/15 1824 09/05/15 1826   09/05/15 2200  piperacillin-tazobactam (ZOSYN) IVPB 4.5 g     4.5 g 25 mL/hr over 240 Minutes Intravenous 3 times per day 09/05/15 1827     09/05/15 2000  vancomycin (VANCOCIN) IVPB 1000 mg/200 mL premix     1,000 mg 200 mL/hr over 60 Minutes Intravenous Every 18 hours 09/05/15 1839     09/05/15 1300  vancomycin (VANCOCIN) IVPB 1000 mg/200 mL premix     1,000 mg 200 mL/hr over 60 Minutes Intravenous  Once 09/05/15 1246 09/05/15 1516   09/05/15 1300  piperacillin-tazobactam (ZOSYN) IVPB 3.375 g     3.375 g 12.5 mL/hr over 240 Minutes Intravenous  Once 09/05/15 1246 09/05/15 1551     Assessment: Pharmacy consulted to dose vancomycin and piperacillin/tazobactam in this 69 year old male presenting to the ED from the nursing home with weakness, fevers, and chills. Pharmacy was consulted to dose for sepsis.   Will use adjusted body weight of 93 kg due to morbid obesity.  CrCl ~ 40 mL/min  Kinetics: Ke=0.038 Half-life: 18.2 hours Vd=65.1L Cmin (calculated) 15.2  Goal of Therapy:  Vancomycin trough level 15-20 mcg/ml  Plan:  Measure antibiotic drug levels at steady state Follow up culture results   Piperacillin/tazobactam 4.5 g  IV q8h EI (higher dose chosen since patient weighs >120kg)  Vancomycin 1g dose given in ED Followed by 1g q18h (stacked dose to begin 6 hours after initial dose) Vancomyin trough ordered for 10/27 at 07:30 prior to 4th dose Calculated estimated Cmin to be ~15.2 using population kinetics, but anticipate that patient will accumulate due to morbid obesity.  Pharmacy will continue to monitor renal function and vancomycin levels and adjust doses as needed. Thank you for the consult!  Jodelle RedMary M Marianna Cid 09/05/2015,6:42 PM

## 2015-09-05 NOTE — Progress Notes (Signed)
Patient states he was given a choice to wear oxygen at night or a CPAP at home. Patient is refusing to wear CPAP

## 2015-09-05 NOTE — Care Management Note (Signed)
Case Management Note  Patient Details  Name: [name] MRN: [id number] Date of Birth: 1946/03/30  Subjective/Objective:    Text from Dayna BarkerKaren Robertson to confirm the pt. Is under [facility]. They see the pt. For COPD, and he is a DNR.                Action/Plan:   Expected Discharge Date:                  Expected Discharge Plan:     In-House Referral:     Discharge planning Services     Post Acute Care Choice:    Choice offered to:     DME Arranged:    DME Agency:     HH Arranged:    HH Agency:     Status of Service:     Medicare Important Message Given:    Date Medicare IM Given:    Medicare IM give by:    Date Additional Medicare IM Given:    Additional Medicare Important Message give by:     If discussed at Long Length of Stay Meetings, dates discussed:    Additional Comments:  Berna BueCheryl Akiel Fennell, RN 09/05/2015, 12:57 PM

## 2015-09-05 NOTE — H&P (Signed)
[facility] - [place] at [facility]   PATIENT NAME: [name]    MR#:  [id number]  DATE OF BIRTH:  08-08-1946  DATE OF ADMISSION:  09/05/2015  PRIMARY CARE PHYSICIAN: Nonlocal   REQUESTING/REFERRING PHYSICIAN: Dr. Toney Rakes  CHIEF COMPLAINT:   Chief Complaint  Patient presents with  . Tremors    HISTORY OF PRESENT ILLNESS:  [name]  is a 69 y.o. male with a known history of hypertension, diabetes, obstructive sleep apnea on CPAP, chronic respiratory failure secondary to COPD on 2 L oxygen, diastolic CHF, coronary artery disease presents from [facility] due to weakness, fevers and chills. Patient has been a resident at [facility] for almost a year now. He is bedbound and has chronic right heel ulcer. He also has lymphedema with bilateral lower extremity edema. He denies any recent cold cough or congestion. Since yesterday he was having weakness and also chills. Last evening had a temperature of 10 17F. He woke up this morning did not get better so was brought to the emergency room. Chest x-rays negative. Patient denies any nausea vomiting or diarrhea. He was unable to void, so Foley catheter is being placed at this time. Urine analysis is not available. He denies any dysuria or discomfort on urination, but has been having incontinent episodes and decreased frequency of urination over the past day. His oral intake has also been decreased. He is also noted to have acute on chronic renal failure.  PAST MEDICAL HISTORY:   Past Medical History  Diagnosis Date  . Bacteremia   . GERD (gastroesophageal reflux disease)   . Hematuria   . Urinary incontinence   . Sleep apnea     on CPAP  . Hyperlipemia   . 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 COPD (chronic obstructive pulmonary disease) (HCC)   . Hypertension   . Lymphedema   . Anxiety disorder   . CHF (congestive heart  failure) (HCC)     diastolic dysfunction, last ECHO from 2015, EF 50-55%  . Muscle weakness   . Morbid obesity (HCC)   . Difficulty in walking     bedbound  . Chronic respiratory failure (HCC)     on 2l oxygen continuous  . Diabetes mellitus (HCC)   . Gout   . CKD (chronic kidney disease)     PAST SURGICAL HISTORY:   Past Surgical History  Procedure Laterality Date  . Coronary angioplasty with stent placement    . Hernia repair      hiatal hernia repair  . Coronary artery bypass graft    . Appendectomy      SOCIAL HISTORY:   Social History  Substance Use Topics  . Smoking status: Former Games developer  . Smokeless tobacco: Not on file  . Alcohol Use: No    FAMILY HISTORY:   Family History  Problem Relation Age of Onset  . Kidney disease Neg Hx   . Prostate cancer Neg Hx     DRUG ALLERGIES:  No Known Allergies  REVIEW OF SYSTEMS:   Review of Systems  Constitutional: Positive for fever and chills. Negative for weight loss and malaise/fatigue.  HENT: Negative for ear discharge, ear pain, hearing loss, nosebleeds and tinnitus.   Eyes: Negative for blurred vision, double vision and photophobia.  Respiratory: Negative for cough, hemoptysis, shortness of breath and wheezing.   Cardiovascular: Positive for leg swelling. Negative for chest pain, palpitations and orthopnea.  Gastrointestinal:  Negative for heartburn, nausea, vomiting, abdominal pain, diarrhea, constipation and melena.  Genitourinary: Positive for urgency and frequency. Negative for dysuria and hematuria.  Musculoskeletal: Negative for myalgias, back pain and neck pain.       Leg pain from right heel ulcer  Skin: Negative for rash.  Neurological: Positive for weakness. Negative for dizziness, tingling, tremors, sensory change, speech change, focal weakness and headaches.  Endo/Heme/Allergies: Does not bruise/bleed easily.  Psychiatric/Behavioral: Negative for depression.    MEDICATIONS AT HOME:   Prior to  Admission medications   Medication Sig Start Date End Date Taking? Authorizing Provider  allopurinol (ZYLOPRIM) 100 MG tablet Take 100 mg by mouth at bedtime.   Yes Historical Provider, MD  dextromethorphan (DELSYM) 30 MG/5ML liquid Take 60 mg by mouth every 12 (twelve) hours as needed for cough.   Yes Historical Provider, MD  divalproex (DEPAKOTE) 125 MG DR tablet Take 250 mg by mouth 2 (two) times daily.   Yes Historical Provider, MD  fluconazole (DIFLUCAN) 150 MG tablet Take 150 mg by mouth once a week. Pt takes on Thursday.   Yes Historical Provider, MD  fluticasone (FLONASE) 50 MCG/ACT nasal spray Place 1 spray into both nostrils daily.   Yes Historical Provider, MD  furosemide (LASIX) 20 MG tablet Take 60 mg by mouth 2 (two) times daily.   Yes Historical Provider, MD  gabapentin (NEURONTIN) 400 MG capsule Take 400 mg by mouth 3 (three) times daily.   Yes Historical Provider, MD  guaiFENesin (MUCINEX) 600 MG 12 hr tablet Take 600 mg by mouth every 12 (twelve) hours.   Yes Historical Provider, MD  ipratropium-albuterol (DUONEB) 0.5-2.5 (3) MG/3ML SOLN Take 3 mLs by nebulization 2 (two) times daily.   Yes Historical Provider, MD  Menthol, Topical Analgesic, (BIOFREEZE EX) Apply 1 application topically as needed (for pain).   Yes Historical Provider, MD  tamsulosin (FLOMAX) 0.4 MG CAPS capsule Take 0.4 mg by mouth daily.   Yes Historical Provider, MD      VITAL SIGNS:  Blood pressure 135/68, pulse 81, temperature 98.3 F (36.8 C), temperature source Oral, resp. rate 14, height  (1.778 m), weight 122.925 kg (271 lb), SpO2 94 %.  PHYSICAL EXAMINATION:   Physical Exam  GENERAL:  69 y.o.-year-old elderly patient lying in the bed with no acute distress.  EYES: Pupils equal, round, post surgical, reactive to light and accommodation. No scleral icterus. Extraocular muscles intact.  HEENT: Head atraumatic, normocephalic. Oropharynx and nasopharynx clear.  NECK:  Supple, no jugular venous  distention. No thyroid enlargement, no tenderness. Serous draining very superficial blister. LUNGS: Normal breath sounds bilaterally, no wheezing, rales,rhonchi or crepitation. No use of accessory muscles of respiration. Decreased bibasilar breath sounds CARDIOVASCULAR: S1, S2 normal. No rubs, or gallops. 3/6 systolic murmur present ABDOMEN: Soft, obese, nontender, nondistended. Bowel sounds present. No organomegaly or mass. Umbilical hernia present EXTREMITIES: No cyanosis, or clubbing. 1+ pedal edema present, there is a 3x3 cm right heel pressure ulcer, some debris present, with sloughing and mild serous discharge NEUROLOGIC: Cranial nerves II through XII are intact. Muscle strength 5/5 in all extremities. Sensation intact. Gait not checked.  PSYCHIATRIC: The patient is alert and oriented x 3.  SKIN: No obvious rash, lesion, except for the right heel ulcer.   LABORATORY PANEL:   CBC  Recent Labs Lab 09/05/15 1218  WBC 9.7  HGB 11.2*  HCT 33.9*  PLT 102*   ------------------------------------------------------------------------------------------------------------------  Chemistries   Recent Labs Lab 09/05/15 1218  NA 140  K 4.3  CL 98*  CO2 33*  GLUCOSE 146*  BUN 51*  CREATININE 2.29*  CALCIUM 8.9  AST 16  ALT 6*  ALKPHOS 42  BILITOT 0.6   ------------------------------------------------------------------------------------------------------------------  Cardiac Enzymes  Recent Labs Lab 09/05/15 1218  TROPONINI 0.05*   ------------------------------------------------------------------------------------------------------------------  RADIOLOGY:  Dg Chest 2 View  09/05/2015  CLINICAL DATA:  Weakness and shaking EXAM: CHEST  2 VIEW COMPARISON:  November 26, 2014 FINDINGS: There is stable elevation of the right hemidiaphragm. There is no edema or consolidation. Heart is mildly enlarged with pulmonary vascularity within normal limits. Patient is status post coronary  artery bypass grafting. No adenopathy. No bone lesions. IMPRESSION: No edema or consolidation. Heart prominent but stable. Stable elevation of the right hemidiaphragm. Electronically Signed   By: Bretta BangWilliam  Woodruff III M.D.   On: 09/05/2015 13:37    EKG:   Orders placed or performed during the hospital encounter of 09/05/15  . ED EKG  . ED EKG  . EKG 12-Lead  . EKG 12-Lead    IMPRESSION AND PLAN:   Kandis Mannandwin Bornhorst  is a 69 y.o. male with a known history of hypertension, diabetes, obstructive sleep apnea on CPAP, chronic respiratory failure secondary to COPD on 2 L oxygen, diastolic CHF, coronary artery disease presents from [facility] due to weakness, fevers and chills.  #1 early sepsis-unknown source at this time. -Had a temperature 10 24F, and also WBC elevated than baseline. -Blood cultures pending, urine analysis and urine cultures pending. Also right heel ulcer wound cultures ordered. Wound care consult. -Empirically on vancomycin and Zosyn for now.  #2 acute on chronic kidney disease-acute renal failure likely prerenal, appears very dehydrated. -Gentle hydration at this time. Baseline creatinine seems to be around 1.2 -If doesn't improve, we'll get a renal ultrasound -Foley catheter placed for urinary retention.  #3 diabetes mellitus-continue 70/30 insulin twice a day and also added sliding scale insulin.  #4 hypertension-well controlled. Only on metoprolol. -Lasix on hold  #5 congestive heart failure-appears clinically dry. -Hold Lasix. Has diastolic dysfunction. -Gentle hydration for now.  #6 DVT prophylaxis-on subcutaneous heparin  Patient is a DO NOT RESUSCITATE. He is followed by [facility].  All the records are reviewed and case discussed with ED provider. Management plans discussed with the patient, family and they are in agreement.  CODE STATUS: DNR  TOTAL TIME TAKING CARE OF THIS PATIENT: 50 minutes.     [name] M.D on 09/05/2015 at 3:42 PM  Between 7am to 6pm - Pager - [phone]  After 6pm go to [email or web address] - password [email or web address] [facility]  [facility]  Office  [phone]  CC: Primary care physician; No primary care provider on file.

## 2015-09-05 NOTE — ED Notes (Signed)
Pt to ED via EMS transport from [facility] with c/o shaking all over and "not feeling well", pt states he noticed shaking yesterday, denies any pain or other complaints

## 2015-09-06 LAB — BASIC METABOLIC PANEL
ANION GAP: 7 (ref 5–15)
BUN: 48 mg/dL — AB (ref 6–20)
CHLORIDE: 101 mmol/L (ref 101–111)
CO2: 34 mmol/L — AB (ref 22–32)
Calcium: 8.7 mg/dL — ABNORMAL LOW (ref 8.9–10.3)
Creatinine, Ser: 2.08 mg/dL — ABNORMAL HIGH (ref 0.61–1.24)
GFR calc Af Amer: 35 mL/min — ABNORMAL LOW (ref >60)
GFR calc non Af Amer: 31 mL/min — ABNORMAL LOW (ref >60)
GLUCOSE: 90 mg/dL (ref 65–99)
POTASSIUM: 4.2 mmol/L (ref 3.5–5.1)
Sodium: 142 mmol/L (ref 135–145)

## 2015-09-06 LAB — GLUCOSE, CAPILLARY
GLUCOSE-CAPILLARY: 120 mg/dL — AB (ref 65–99)
GLUCOSE-CAPILLARY: 119 mg/dL — AB (ref 65–99)
Glucose-Capillary: 81 mg/dL (ref 65–99)
Glucose-Capillary: 132 mg/dL — ABNORMAL HIGH (ref 65–99)
Glucose-Capillary: 126 mg/dL — ABNORMAL HIGH (ref 65–99)

## 2015-09-06 LAB — CBC
HEMATOCRIT: 29.2 % — AB (ref 40.0–52.0)
HEMOGLOBIN: 10 g/dL — AB (ref 13.0–18.0)
MCH: 29.8 pg (ref 26.0–34.0)
MCHC: 34 g/dL (ref 32.0–36.0)
MCV: 87.6 fL (ref 80.0–100.0)
PLATELETS: 88 10*3/uL — AB (ref 150–440)
RBC: 3.34 MIL/uL — ABNORMAL LOW (ref 4.40–5.90)
RDW: 14.6 % — AB (ref 11.5–14.5)
WBC: 7.1 10*3/uL (ref 3.8–10.6)

## 2015-09-06 LAB — MRSA PCR SCREENING: MRSA by PCR: NEGATIVE

## 2015-09-06 MED ORDER — FINASTERIDE 5 MG PO TABS
5.0000 mg | ORAL_TABLET | Freq: Every day | ORAL | Status: DC
  Administered 2015-09-06 – 2015-09-07 (×2): 5 mg via ORAL
  Filled 2015-09-06 (×2): qty 1

## 2015-09-06 NOTE — Plan of Care (Signed)
Problem: Discharge Progression Outcomes Goal: Other Discharge Outcomes/Goals Outcome: Progressing VSS. Continue IVF, ABX. Norco given once for R foot pain with improvement. Dressings changed per order. Tolerates diet. Foley continues.

## 2015-09-06 NOTE — Progress Notes (Signed)
Visit made. Patient seen sitting up in bed, alert and oriented. Staff RN Malka present to give oral medications, patient swallowed with out difficulty. Wound care consult completed with new treatments ordered. He remains on IV antibiotics for treatment of sepsis, urine and wound culture pending. Writer assisted patient with his lunch tray and repositioning of his right foot, which remains painful to touch/movement d/t an unstagable pressure ulcer. Patient left eating and watching television. Will continue to follow and update hospice team. Dayna BarkerKaren Robertson RN, [name], [facility], [facility] L[facility] [phone] c

## 2015-09-06 NOTE — Care Management (Signed)
Admitted to [facility] with the diagnosis of sepsis. A resident of [facility] since 09/06/14. Sister is Rudean HaskellMary Rabon [phone]). Wife is Artemio AlySlyvia [phone]). Primary care physician is Dr. Katrinka BlazingSmith in [place]. Followed by [facility].  Infection Consult. Diabetic leg ulcer. Podiatry consult. WBC's 7.1. IV Zosyn. Gwenette GreetBrenda S Fate Caster RN MSN CCM Care management [phone]

## 2015-09-06 NOTE — Progress Notes (Addendum)
[facility] - [place] at [facility]   PATIENT NAME: [name]    MR#:  [id number]  DATE OF BIRTH:  07-30-1946  SUBJECTIVE:  CHIEF COMPLAINT:   Chief Complaint  Patient presents with  . Tremors  c/o lots of heel pain on minimal touch. Sugars running in 100s.. Feels weak REVIEW OF SYSTEMS:  Review of Systems  Constitutional: Positive for malaise/fatigue. Negative for fever, weight loss and diaphoresis.  HENT: Negative for ear discharge, ear pain, hearing loss, nosebleeds, sore throat and tinnitus.   Eyes: Negative for blurred vision and pain.  Respiratory: Negative for cough, hemoptysis, shortness of breath and wheezing.   Cardiovascular: Negative for chest pain, palpitations, orthopnea and leg swelling.  Gastrointestinal: Negative for heartburn, nausea, vomiting, abdominal pain, diarrhea, constipation and blood in stool.  Genitourinary: Negative for dysuria, urgency and frequency.  Musculoskeletal: Negative for myalgias and back pain.  Skin: Negative for itching and rash.  Neurological: Positive for weakness. Negative for dizziness, tingling, tremors, focal weakness, seizures and headaches.  Psychiatric/Behavioral: Negative for depression. The patient is not nervous/anxious.     DRUG ALLERGIES:  No Known Allergies VITALS:  Blood pressure 140/58, pulse 64, temperature 98.7 F (37.1 C), temperature source Oral, resp. rate 20, height  (1.778 m), weight 124.286 kg (274 lb), SpO2 96 %. PHYSICAL EXAMINATION:  Physical Exam  Constitutional: He is oriented to person, place, and time and well-developed, well-nourished, and in no distress.  HENT:  Head: Normocephalic and atraumatic.  Eyes: Conjunctivae and EOM are normal. Pupils are equal, round, and reactive to light.  Neck: Normal range of motion. Neck supple. No tracheal deviation present. No thyromegaly present.  Cardiovascular: Normal rate, regular rhythm and normal heart sounds.    Pulmonary/Chest: Effort normal and breath sounds normal. No respiratory distress. He has no wheezes. He exhibits no tenderness.  Abdominal: Soft. Bowel sounds are normal. He exhibits no distension. There is no tenderness.  Musculoskeletal: Normal range of motion.  Stiff in guarded range of motion in the feet. There is some pain around the posterior aspect of the right heel - very sensitive to touch  Neurological: He is alert and oriented to person, place, and time. No cranial nerve deficit.  Skin: Skin is warm and dry. Lesion and rash noted.  atrophic. Some chronic hyperpigmentation's on the legs most likely from edema. A large full-thickness ulceration on the posterior aspect of the right heel measuring approximately 4 cm diameter. A fibrotic base with no active drainage. Mild surrounding erythema   Psychiatric: Mood and affect normal.   LABORATORY PANEL:   CBC  Recent Labs Lab 09/06/15 0431  WBC 7.1  HGB 10.0*  HCT 29.2*  PLT 88*   ------------------------------------------------------------------------------------------------------------------ Chemistries   Recent Labs Lab 09/05/15 1218 09/06/15 0431  NA 140 142  K 4.3 4.2  CL 98* 101  CO2 33* 34*  GLUCOSE 146* 90  BUN 51* 48*  CREATININE 2.29* 2.08*  CALCIUM 8.9 8.7*  AST 16  --   ALT 6*  --   ALKPHOS 42  --   BILITOT 0.6  --    RADIOLOGY:  Dg Foot Complete Right  09/05/2015  CLINICAL DATA:  Pain. Chronic right heel ulcer, bilateral lymphedema, recent fever. Renal failure. EXAM: RIGHT FOOT COMPLETE - 3+ VIEW COMPARISON:  None. FINDINGS: Orthopedic hardware across the distal tibiofibular syndesmosis and medial malleolus. Calcaneal spurs. Extensive arterial calcifications. Second through fifth digits are not well profiled. No definite fracture. No dislocation. No subcutaneous gas  or radiodense foreign body identified. IMPRESSION: 1. Chronic and postoperative changes as above, without fracture or other acute bone  abnormality. Electronically Signed   By: Corlis Leak  Hassell M.D.   On: 09/05/2015 16:10   ASSESSMENT AND PLAN:  [name] is a 69 y.o. male with a known history of hypertension, diabetes, obstructive sleep apnea on CPAP, chronic respiratory failure secondary to COPD on 2 L oxygen, diastolic CHF, coronary artery disease presents from [facility] due to weakness, fevers and chills.  #1 early sepsis-still unknown source at this time. -Had a temperature 101F, and also WBC elevated than baseline on admission but WBC normalized today. Has been afebrile -Blood cultures neg, urine analysis not impressive and urine cultures pending. Also right heel ulcer wound cultures neg thus far. Wound care consult. CXR also neg. - Appreciate ID input - recommends Cont vanco zosyn pending bcx and wound cx results. On D/C short course of abx for the eschar associated cellulitis - likely augmentin x 10 days  #2 acute on chronic kidney disease-acute renal failure likely prerenal, appears very dehydrated on admission. Improving with IV hydration. -Gentle hydration at this time. Baseline creatinine seems to be around 1.2 -Foley catheter placed for urinary retention on admission - leave indwelling for now. Will add proscar. Already on flomax.  #3 diabetes mellitus- sugars running in good control. 100-140s, hold off insulin to avoid risk of hypoglycemia  #4 hypertension-well controlled. Only on metoprolol. -Lasix on hold  #5 chronic diastolic congestive heart failure-clinically dry on admission -Hold Lasix. - continue Gentle hydration for now.  #6 Chronic full-thickness ulceration posterior right heel: will follow wound care recommendatios. Appreciate podiatry input. No signs of infection.  DVT prophylaxis-on subcutaneous heparin    All the records are reviewed and case discussed with Care Management/Social Worker. Management plans discussed with the patient and he is in agreement.  CODE  STATUS: DO NOT RESUSCITATE. He is [facility].  TOTAL TIME TAKING CARE OF THIS PATIENT: 35 minutes.   More than 50% of the time was spent in counseling/coordination of care: YES  POSSIBLE D/C IN 1-2 DAYS, DEPENDING ON CLINICAL CONDITION and ID input.   [facility], Laureen Frederic M.D on 09/06/2015 at 1:53 PM  Between 7am to 6pm - Pager - [phone]  After 6pm go to [email or web address] - password [email or web address] [facility]  [facility]  Office  [phone]  CC: Primary care physician; No primary care provider on file.

## 2015-09-06 NOTE — Progress Notes (Signed)
Initial Nutrition Assessment   INTERVENTION:   Meals and Snacks: Cater to patient preferences. Will send meats chopped per pt request as pt edentulous Medical Food Supplement Therapy: will recommend on follow if intake inadequate. RD notes pt does not like Ensure/Glucerna supplements. Coordination of Care: recommend daily weights   NUTRITION DIAGNOSIS:   Increased nutrient needs related to wound healing as evidenced by estimated needs.  GOAL:   Patient will meet greater than or equal to 90% of their needs  MONITOR:    (Energy Intake, Skin, Digestive System, Anthropometrics, Electrolyte and Renal Profile)  REASON FOR ASSESSMENT:   Malnutrition Screening Tool (Pressure Ulcer)    ASSESSMENT:   [facility] admitted [facility] with weakness, acute renal failure and early sepsis. Pt bedbound with chronic right heel ulcer per MD note.  Past Medical History  Diagnosis Date  . Bacteremia   . GERD (gastroesophageal reflux disease)   . Hematuria   . Urinary incontinence   . Sleep apnea     on CPAP  . Hyperlipemia   . 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 COPD (chronic obstructive pulmonary disease) (HCC)   . Hypertension   . Lymphedema   . Anxiety disorder   . CHF (congestive heart failure) (HCC)     diastolic dysfunction, last ECHO from 2015, EF 50-55%  . Muscle weakness   . Morbid obesity (HCC)   . Difficulty in walking     bedbound  . Chronic respiratory failure (HCC)     on 2l oxygen continuous  . Diabetes mellitus (HCC)   . Gout   . CKD (chronic kidney disease)     Diet Order:  Diet heart healthy/carb modified Room service appropriate?: Yes; Fluid consistency:: Thin   Current Nutrition: Pt reports eating eggs and grits this am but could not eat home fries as they were too tough to chew.  Recorded po intake this am of 70%.  Food/Nutrition-Related History: Pt reports good appetite PTA eating 3 meals per  day 'if I like it.' Pt reports not liking ensure/glucerna supplement products.    Scheduled Medications:  . allopurinol  100 mg Oral QHS  . aspirin  324 mg Oral Once  . collagenase  1 application Topical Daily  . divalproex  250 mg Oral BID  . [START ON 09/07/2015] fluconazole  150 mg Oral Q Thu  . fluticasone  1 spray Each Nare Daily  . gabapentin  400 mg Oral TID  . guaiFENesin  600 mg Oral Q12H  . heparin  5,000 Units Subcutaneous 3 times per day  . insulin aspart  0-5 Units Subcutaneous QHS  . insulin aspart  0-9 Units Subcutaneous TID WC  . insulin aspart protamine- aspart  15 Units Subcutaneous Q breakfast  . insulin aspart protamine- aspart  25 Units Subcutaneous QHS  . ipratropium-albuterol  3 mL Nebulization BID  . loratadine  10 mg Oral Daily  . metoprolol  50 mg Oral Daily  . nystatin  1 g Topical TID  . pantoprazole  40 mg Oral BID  . piperacillin-tazobactam (ZOSYN)  IV  4.5 g Intravenous 3 times per day  . sertraline  150 mg Oral Daily  . tamsulosin  0.4 mg Oral Daily  . traZODone  25 mg Oral QHS  . vancomycin  1,000 mg Intravenous Q18H    Continuous Medications:  . sodium chloride 75 mL/hr at 09/06/15 0602     Electrolyte/Renal Profile and Glucose Profile:  Recent Labs Lab 09/05/15 1218 09/06/15 0431  NA 140 142  K 4.3 4.2  CL 98* 101  CO2 33* 34*  BUN 51* 48*  CREATININE 2.29* 2.08*  CALCIUM 8.9 8.7*  GLUCOSE 146* 90   Protein Profile:  Recent Labs Lab 09/05/15 1218  ALBUMIN 3.1*    Gastrointestinal Profile: Last BM: 09/04/2015   Nutrition-Focused Physical Exam Findings: Nutrition-Focused physical exam completed. Findings are no fat depletion, no muscle depletion, and 1-2+ edema of lower extremities documented, however unable to assess lower extremities on visit.    Weight Change: Pt reports weight gain in the past 3 months secondary to fluid. Pt reports UBW of 250lbs, current weight of 274lbs.   Skin:   (Unstageable heel ulcer, Stage  II left ischium pressure ulcer per WOC)   Height:   Ht Readings from Last 1 Encounters:  09/05/15  (1.778 m)    Weight:   Wt Readings from Last 1 Encounters:  09/05/15 274 lb (124.286 kg)    Ideal Body Weight:   75.5kg  BMI:  Body mass index is 39.32 kg/(m^2).  Estimated Nutritional Needs:   Kcal:  using IBW of 75.5kg, BEE: 1516kcals, TEE: (IF 1.1-1.3)(AF 1.2) 2001-2365kcals  Protein:  83-98g protein (1.1-1.3g/kg)  Fluid:  1888-2228mL of fluid (25-44mL/kg)  EDUCATION NEEDS:   Education needs no appropriate at this time    MODERATE Care Level  Leda Quail, RD, LDN Pager [phone]

## 2015-09-06 NOTE — Consult Note (Signed)
[facility]ious [facility]     Reason for Consult:Sepsos    Referring Physician:  Max Sane Date of Admission:  09/05/2015   Active Problems:   Sepsis ([place])   HPI: [name] is a 69 y.o. male with  history of hypertension, diabetes, obstructive sleep apnea on CPAP, chronic respiratory failure secondary to COPD on 2 L oxygen, diastolic CHF, coronary artery disease admitted with weakness fevers and chills and found to have chronic heel ulceration. He is bedbound, resides at [facility].  He also has chronic bil LE edema and lymphedema.  On admit had temp 101, ARF.  Also had foley placed as could not urinate and had 1500 cc per report returned. Has started on vanco, zosyn, Podiatry consulted. XRAY shows hardware in ankle but no evidence osteomyelitis.  Wound cx pending, [place] NGTD.  Lactic acid nml, UA 0-5 wbc. CXR neg  Of note on review of prior data he had admission in Jan 2016 with sepsis. He had also MDR Klebsiella UTI treated with invanz then augmentin.   Past Medical History  Diagnosis Date  . Bacteremia   . GERD (gastroesophageal reflux disease)   . Hematuria   . Urinary incontinence   . Sleep apnea     on CPAP  . Hyperlipemia   . 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 COPD (chronic obstructive pulmonary disease) ([place])   . Hypertension   . Lymphedema   . Anxiety disorder   . CHF (congestive heart failure) (HCC)     diastolic dysfunction, last ECHO from 2015, EF 50-55%  . Muscle weakness   . Morbid obesity ([place])   . Difficulty in walking     bedbound  . Chronic respiratory failure (H[facility])     on 2l oxygen continuous  . Diabetes mellitus ([place])   . Gout   . CKD (chronic kidney disease)    Past Surgical History  Procedure Laterality Date  . Coronary angioplasty with stent placement    . Hernia repair      hiatal hernia repair  . Coronary artery bypass graft    . Appendectomy     Social History  Substance Use  Topics  . Smoking status: Former Research scientist (life sciences)  . Smokeless tobacco: None  . Alcohol Use: No   Family History  Problem Relation Age of Onset  . Kidney disease Neg Hx   . Prostate cancer Neg Hx     Allergies: No Known Allergies  Current antibiotics: Antibiotics Given (last 72 hours)    Date/Time Action Medication Dose Rate   09/05/15 2045 Given   vancomycin (VANCOCIN) IVPB 1000 mg/200 mL premix 1,000 mg 200 mL/hr   09/05/15 2145 Given   piperacillin-tazobactam (ZOSYN) IVPB 4.5 g 4.5 g 25 mL/hr   09/06/15 0602 Given   piperacillin-tazobactam (ZOSYN) IVPB 4.5 g 4.5 g 25 mL/hr      MEDICATIONS: . allopurinol  100 mg Oral QHS  . aspirin  324 mg Oral Once  . collagenase  1 application Topical Daily  . divalproex  250 mg Oral BID  . [START ON 09/07/2015] fluconazole  150 mg Oral Q Thu  . fluticasone  1 spray Each Nare Daily  . gabapentin  400 mg Oral TID  . guaiFENesin  600 mg Oral Q12H  . heparin  5,000 Units Subcutaneous 3 times per day  . insulin aspart  0-5 Units Subcutaneous QHS  . insulin aspart  0-9 Units Subcutaneous TID WC  . insulin  aspart protamine- aspart  15 Units Subcutaneous Q breakfast  . insulin aspart protamine- aspart  25 Units Subcutaneous QHS  . ipratropium-albuterol  3 mL Nebulization BID  . loratadine  10 mg Oral Daily  . metoprolol  50 mg Oral Daily  . nystatin  1 g Topical TID  . pantoprazole  40 mg Oral BID  . piperacillin-tazobactam (ZOSYN)  IV  4.5 g Intravenous 3 times per day  . sertraline  150 mg Oral Daily  . tamsulosin  0.4 mg Oral Daily  . traZODone  25 mg Oral QHS  . vancomycin  1,000 mg Intravenous Q18H   Review of Systems - 11 systems reviewed and negative per HPI OBJECTIVE: Temp:  [98.2 F (36.8 C)-98.8 F (37.1 C)] 98.7 F (37.1 C) (10/26 1242) Pulse Rate:  [64-81] 64 (10/26 1242) Resp:  [14-29] 20 (10/26 1242) BP: (118-145)/(50-73) 140/58 mmHg (10/26 1242) SpO2:  [88 %-97 %] 96 % (10/26 1242) Weight:  [124.286 kg (274 lb)] 124.286  kg (274 lb) (10/25 1726) Physical Exam  Constitutional: He is oriented to person, place, and time. Morbidly obese, on O2, chronically ill appearing  HENT: PERRLA, [place] on O2 Mouth/Throat: Oropharynx is clear and dry. No oropharyngeal exudate.  Cardiovascular: distant Pulmonary/Chest: Effort normal and breath sounds normal. No respiratory distress. He has no wheezes.  Abdominal: Soft. obese Bowel sounds are normal. He exhibits no distension. There is no tenderness.  Lymphadenopathy:  He has no cervical adenopathy.  Neurological: He is alert and oriented to person, place, and time.  Ext 1+ edema bil LE Skin: Skin - RLE with cvs changes and hyperpigmentation. R heel with black eschar - some tenderness, no drainage Psychiatric: He has a normal mood and affect. His behavior is normal.   LABS: Results for orders placed or performed during the hospital encounter of 09/05/15 (from the past 48 hour(s))  Basic metabolic panel     Status: Abnormal   Collection Time: 09/05/15 12:18 PM  Result Value Ref Range   Sodium 140 135 - 145 mmol/L   Potassium 4.3 3.5 - 5.1 mmol/L   Chloride 98 (L) 101 - 111 mmol/L   CO2 33 (H) 22 - 32 mmol/L   Glucose, Bld 146 (H) 65 - 99 mg/dL   BUN 51 (H) 6 - 20 mg/dL   Creatinine, Ser 2.29 (H) 0.61 - 1.24 mg/dL   Calcium 8.9 8.9 - 10.3 mg/dL   GFR calc non Af Amer 27 (L) >60 mL/min   GFR calc Af Amer 32 (L) >60 mL/min    Comment: (NOTE) The eGFR has been calculated using the CKD EPI equation. This calculation has not been validated in all clinical situations. eGFR's persistently <60 mL/min signify possible Chronic Kidney Disease.    Anion gap 9 5 - 15  CBC     Status: Abnormal   Collection Time: 09/05/15 12:18 PM  Result Value Ref Range   WBC 9.7 3.8 - 10.6 K/uL   RBC 3.87 (L) 4.40 - 5.90 MIL/uL   Hemoglobin 11.2 (L) 13.0 - 18.0 g/dL   HCT 33.9 (L) 40.0 - 52.0 %   MCV 87.6 80.0 - 100.0 fL   MCH 29.0 26.0 - 34.0 pg   MCHC 33.1 32.0 - 36.0 g/dL   RDW 14.6 (H)  11.5 - 14.5 %   Platelets 102 (L) 150 - 440 K/uL  Hepatic function panel     Status: Abnormal   Collection Time: 09/05/15 12:18 PM  Result Value Ref Range   Total  Protein 6.1 (L) 6.5 - 8.1 g/dL   Albumin 3.1 (L) 3.5 - 5.0 g/dL   AST 16 15 - 41 U/L   ALT 6 (L) 17 - 63 U/L   Alkaline Phosphatase 42 38 - 126 U/L   Total Bilirubin 0.6 0.3 - 1.2 mg/dL   Bilirubin, Direct 0.2 0.1 - 0.5 mg/dL   Indirect Bilirubin 0.4 0.3 - 0.9 mg/dL  Troponin I     Status: Abnormal   Collection Time: 09/05/15 12:18 PM  Result Value Ref Range   Troponin I 0.05 (H) <0.031 ng/mL    Comment: READ BACK AND VERIFIED WITH KATE BUMGARNER AT 9147 09/05/15 DAS        PERSISTENTLY INCREASED TROPONIN VALUES IN THE RANGE OF 0.04-0.49 ng/mL CAN BE SEEN IN:       -UNSTABLE ANGINA       -CONGESTIVE HEART FAILURE       -MYOCARDITIS       -CHEST TRAUMA       -ARRYHTHMIAS       -LATE PRESENTING MYOCARDIAL INFARCTION       -COPD   CLINICAL FOLLOW-UP RECOMMENDED.   Urinalysis complete, with microscopic ([facility] only)     Status: Abnormal   Collection Time: 09/05/15  1:59 PM  Result Value Ref Range   Color, Urine YELLOW (A) YELLOW   APPearance CLEAR (A) CLEAR   Glucose, UA NEGATIVE NEGATIVE mg/dL   Bilirubin Urine NEGATIVE NEGATIVE   Ketones, ur NEGATIVE NEGATIVE mg/dL   Specific Gravity, Urine 1.014 1.005 - 1.030   Hgb urine dipstick NEGATIVE NEGATIVE   pH 5.0 5.0 - 8.0   Protein, ur NEGATIVE NEGATIVE mg/dL   Nitrite NEGATIVE NEGATIVE   Leukocytes, UA NEGATIVE NEGATIVE   RBC / HPF 0-5 0 - 5 RBC/hpf   WBC, UA 0-5 0 - 5 WBC/hpf   Bacteria, UA RARE (A) NONE SEEN   Squamous Epithelial / LPF 0-5 (A) NONE SEEN   Mucous PRESENT    Hyaline Casts, UA PRESENT   Lactic acid, plasma     Status: None   Collection Time: 09/05/15  2:40 PM  Result Value Ref Range   Lactic Acid, Venous 1.6 0.5 - 2.0 mmol/L  Glucose, capillary     Status: Abnormal   Collection Time: 09/05/15  6:13 PM  Result Value Ref Range    Glucose-Capillary 147 (H) 65 - 99 mg/dL   Comment 1 Notify RN   Lactic acid, plasma     Status: None   Collection Time: 09/05/15  6:16 PM  Result Value Ref Range   Lactic Acid, Venous 1.3 0.5 - 2.0 mmol/L  Glucose, capillary     Status: Abnormal   Collection Time: 09/05/15  9:05 PM  Result Value Ref Range   Glucose-Capillary 155 (H) 65 - 99 mg/dL  Wound culture     Status: None (Preliminary result)   Collection Time: 09/06/15  2:36 AM  Result Value Ref Range   Specimen Description WOUND    Special Requests NONE    Gram Stain PENDING    Culture NO GROWTH < 12 HOURS    Report Status PENDING   Basic metabolic panel     Status: Abnormal   Collection Time: 09/06/15  4:31 AM  Result Value Ref Range   Sodium 142 135 - 145 mmol/L   Potassium 4.2 3.5 - 5.1 mmol/L   Chloride 101 101 - 111 mmol/L   CO2 34 (H) 22 - 32 mmol/L   Glucose, Bld 90 65 -  99 mg/dL   BUN 48 (H) 6 - 20 mg/dL   Creatinine, Ser 2.08 (H) 0.61 - 1.24 mg/dL   Calcium 8.7 (L) 8.9 - 10.3 mg/dL   GFR calc non Af Amer 31 (L) >60 mL/min   GFR calc Af Amer 35 (L) >60 mL/min    Comment: (NOTE) The eGFR has been calculated using the CKD EPI equation. This calculation has not been validated in all clinical situations. eGFR's persistently <60 mL/min signify possible Chronic Kidney Disease.    Anion gap 7 5 - 15  CBC     Status: Abnormal   Collection Time: 09/06/15  4:31 AM  Result Value Ref Range   WBC 7.1 3.8 - 10.6 K/uL   RBC 3.34 (L) 4.40 - 5.90 MIL/uL   Hemoglobin 10.0 (L) 13.0 - 18.0 g/dL   HCT 29.2 (L) 40.0 - 52.0 %   MCV 87.6 80.0 - 100.0 fL   MCH 29.8 26.0 - 34.0 pg   MCHC 34.0 32.0 - 36.0 g/dL   RDW 14.6 (H) 11.5 - 14.5 %   Platelets 88 (L) 150 - 440 K/uL  MRSA PCR Screening     Status: None   Collection Time: 09/06/15  4:33 AM  Result Value Ref Range   MRSA by PCR NEGATIVE NEGATIVE    Comment:        The GeneXpert MRSA Assay (FDA approved for NASAL specimens only), is one component of a comprehensive  MRSA colonization surveillance program. It is not intended to diagnose MRSA infection nor to guide or monitor treatment for MRSA infections.   Glucose, capillary     Status: None   Collection Time: 09/06/15  7:38 AM  Result Value Ref Range   Glucose-Capillary 81 65 - 99 mg/dL   Comment 1 Notify RN   Glucose, capillary     Status: Abnormal   Collection Time: 09/06/15  8:57 AM  Result Value Ref Range   Glucose-Capillary 132 (H) 65 - 99 mg/dL   Comment 1 Notify RN   Glucose, capillary     Status: Abnormal   Collection Time: 09/06/15 12:42 PM  Result Value Ref Range   Glucose-Capillary 120 (H) 65 - 99 mg/dL   Comment 1 Notify RN    No components found for: ESR, C REACTIVE PROTEIN MICRO: Recent Results (from the past 720 hour(s))  Wound culture     Status: None (Preliminary result)   Collection Time: 09/06/15  2:36 AM  Result Value Ref Range Status   Specimen Description WOUND  Final   Special Requests NONE  Final   Gram Stain PENDING  Incomplete   Culture NO GROWTH < 12 HOURS  Final   Report Status PENDING  Incomplete  MRSA PCR Screening     Status: None   Collection Time: 09/06/15  4:33 AM  Result Value Ref Range Status   MRSA by PCR NEGATIVE NEGATIVE Final    Comment:        The GeneXpert MRSA Assay (FDA approved for NASAL specimens only), is one component of a comprehensive MRSA colonization surveillance program. It is not intended to diagnose MRSA infection nor to guide or monitor treatment for MRSA infections.     IMAGING: Dg Chest 2 View  09/05/2015  CLINICAL DATA:  Weakness and shaking EXAM: CHEST  2 VIEW COMPARISON:  November 26, 2014 FINDINGS: There is stable elevation of the right hemidiaphragm. There is no edema or consolidation. Heart is mildly enlarged with pulmonary vascularity within normal limits. Patient is  status post coronary artery bypass grafting. No adenopathy. No bone lesions. IMPRESSION: No edema or consolidation. Heart prominent but stable.  Stable elevation of the right hemidiaphragm. Electronically Signed   By: Lowella Grip III M.D.   On: 09/05/2015 13:37   Dg Foot Complete Right  09/05/2015  CLINICAL DATA:  Pain. Chronic right heel ulcer, bilateral lymphedema, recent fever. Renal failure. EXAM: RIGHT FOOT COMPLETE - 3+ VIEW COMPARISON:  None. FINDINGS: Orthopedic hardware across the distal tibiofibular syndesmosis and medial malleolus. Calcaneal spurs. Extensive arterial calcifications. Second through fifth digits are not well profiled. No definite fracture. No dislocation. No subcutaneous gas or radiodense foreign body identified. IMPRESSION: 1. Chronic and postoperative changes as above, without fracture or other acute bone abnormality. Electronically Signed   By: Lucrezia Europe M.D.   On: 09/05/2015 16:10    Assessment:   [name] is a 69 y.o. male with  history of hypertension, diabetes, obstructive sleep apnea on CPAP, chronic respiratory failure secondary to COPD on 2 L oxygen, diastolic CHF, coronary artery disease admitted with weakness fevers and chills and found to have chronic heel ulceration. He is bedbound, resides at [facility].  He also has chronic bil LE edema and lymphedema.  On admit had temp 101 per report, ARF.  Also had foley placed as could not urinate and had 1500 cc per report returned. Has started on vanco, zosyn, Podiatry consulted. XRAY shows hardware in ankle but no evidence osteomyelitis.  Wound cx pending, [place] NGTD.  Lactic acid nml, UA 0-5 wbc. CXR neg Of note on review of prior data he had admission in Jan 2016 with sepsis. He had also MDR Klebsiella UTI treated with invanz then augmentin.   Currently he appears to have more of a cellulitis surrounding his chronic heel ulcer. Podiatry does not feel further debridement is necessary.  His renal failure may be related to BPH with obstruction given the 1500 cc obtained after cath.  He does have hx BPH and is on flomax  Recommendations Cont vanco  zosyn pending bcx and wound cx results. Would benefit from short course of abx for the eschar associated cellulitis - likely augmentin x 10 days Consider leaving Foley in and starting proscar for BPH in addition to the flomax - he would need fu with urology to ensure he can urinate after removal as otpt. Await ucx as well but UA was unimpressive.  Thank you very much for allowing me to participate in the care of this patient. Please call with questions.   Cheral Marker. Ola Spurr, MD

## 2015-09-06 NOTE — Consult Note (Signed)
Reason for Consult: Chronic ulceration right heel Referring Physician: [facility]  [name] is an 69 y.o. male.  HPI: The patient relates a 4 month history of an ulceration on the back of his right heel. Currently resides at [facility] and they have been performing the wound care. He is not seen any other physicians or the wound care center during that time. States that there has been a significant amount of black scab that has come off of the wound over the last few months.  Past Medical History  Diagnosis Date  . Bacteremia   . GERD (gastroesophageal reflux disease)   . Hematuria   . Urinary incontinence   . Sleep apnea     on CPAP  . Hyperlipemia   . 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 COPD (chronic obstructive pulmonary disease) ([place])   . Hypertension   . Lymphedema   . Anxiety disorder   . CHF (congestive heart failure) (HCC)     diastolic dysfunction, last ECHO from 2015, EF 50-55%  . Muscle weakness   . Morbid obesity ([place])   . Difficulty in walking     bedbound  . Chronic respiratory failure (HCC)     on 2l oxygen continuous  . Diabetes mellitus ([place])   . Gout   . CKD (chronic kidney disease)     Past Surgical History  Procedure Laterality Date  . Coronary angioplasty with stent placement    . Hernia repair      hiatal hernia repair  . Coronary artery bypass graft    . Appendectomy      Family History  Problem Relation Age of Onset  . Kidney disease Neg Hx   . Prostate cancer Neg Hx     Social History:  reports that he has quit smoking. He does not have any smokeless tobacco history on file. He reports that he does not drink alcohol or use illicit drugs.  Allergies: No Known Allergies  Medications: I have reviewed the patient's current medications.  Results for orders placed or performed during the hospital encounter of 09/05/15 (from the past 48 hour(s))  Basic metabolic  panel     Status: Abnormal   Collection Time: 09/05/15 12:18 PM  Result Value Ref Range   Sodium 140 135 - 145 mmol/L   Potassium 4.3 3.5 - 5.1 mmol/L   Chloride 98 (L) 101 - 111 mmol/L   CO2 33 (H) 22 - 32 mmol/L   Glucose, Bld 146 (H) 65 - 99 mg/dL   BUN 51 (H) 6 - 20 mg/dL   Creatinine, Ser 2.29 (H) 0.61 - 1.24 mg/dL   Calcium 8.9 8.9 - 10.3 mg/dL   GFR calc non Af Amer 27 (L) >60 mL/min   GFR calc Af Amer 32 (L) >60 mL/min    Comment: (NOTE) The eGFR has been calculated using the CKD EPI equation. This calculation has not been validated in all clinical situations. eGFR's persistently <60 mL/min signify possible Chronic Kidney Disease.    Anion gap 9 5 - 15  CBC     Status: Abnormal   Collection Time: 09/05/15 12:18 PM  Result Value Ref Range   WBC 9.7 3.8 - 10.6 K/uL   RBC 3.87 (L) 4.40 - 5.90 MIL/uL   Hemoglobin 11.2 (L) 13.0 - 18.0 g/dL   HCT 33.9 (L) 40.0 - 52.0 %   MCV 87.6 80.0 - 100.0 fL   MCH 29.0  26.0 - 34.0 pg   MCHC 33.1 32.0 - 36.0 g/dL   RDW 14.6 (H) 11.5 - 14.5 %   Platelets 102 (L) 150 - 440 K/uL  Hepatic function panel     Status: Abnormal   Collection Time: 09/05/15 12:18 PM  Result Value Ref Range   Total Protein 6.1 (L) 6.5 - 8.1 g/dL   Albumin 3.1 (L) 3.5 - 5.0 g/dL   AST 16 15 - 41 U/L   ALT 6 (L) 17 - 63 U/L   Alkaline Phosphatase 42 38 - 126 U/L   Total Bilirubin 0.6 0.3 - 1.2 mg/dL   Bilirubin, Direct 0.2 0.1 - 0.5 mg/dL   Indirect Bilirubin 0.4 0.3 - 0.9 mg/dL  Troponin I     Status: Abnormal   Collection Time: 09/05/15 12:18 PM  Result Value Ref Range   Troponin I 0.05 (H) <0.031 ng/mL    Comment: READ BACK AND VERIFIED WITH KATE BUMGARNER AT 2703 09/05/15 DAS        PERSISTENTLY INCREASED TROPONIN VALUES IN THE RANGE OF 0.04-0.49 ng/mL CAN BE SEEN IN:       -UNSTABLE ANGINA       -CONGESTIVE HEART FAILURE       -MYOCARDITIS       -CHEST TRAUMA       -ARRYHTHMIAS       -LATE PRESENTING MYOCARDIAL INFARCTION       -COPD    CLINICAL FOLLOW-UP RECOMMENDED.   Urinalysis complete, with microscopic ([facility] only)     Status: Abnormal   Collection Time: 09/05/15  1:59 PM  Result Value Ref Range   Color, Urine YELLOW (A) YELLOW   APPearance CLEAR (A) CLEAR   Glucose, UA NEGATIVE NEGATIVE mg/dL   Bilirubin Urine NEGATIVE NEGATIVE   Ketones, ur NEGATIVE NEGATIVE mg/dL   Specific Gravity, Urine 1.014 1.005 - 1.030   Hgb urine dipstick NEGATIVE NEGATIVE   pH 5.0 5.0 - 8.0   Protein, ur NEGATIVE NEGATIVE mg/dL   Nitrite NEGATIVE NEGATIVE   Leukocytes, UA NEGATIVE NEGATIVE   RBC / HPF 0-5 0 - 5 RBC/hpf   WBC, UA 0-5 0 - 5 WBC/hpf   Bacteria, UA RARE (A) NONE SEEN   Squamous Epithelial / LPF 0-5 (A) NONE SEEN   Mucous PRESENT    Hyaline Casts, UA PRESENT   Lactic acid, plasma     Status: None   Collection Time: 09/05/15  2:40 PM  Result Value Ref Range   Lactic Acid, Venous 1.6 0.5 - 2.0 mmol/L  Glucose, capillary     Status: Abnormal   Collection Time: 09/05/15  6:13 PM  Result Value Ref Range   Glucose-Capillary 147 (H) 65 - 99 mg/dL   Comment 1 Notify RN   Lactic acid, plasma     Status: None   Collection Time: 09/05/15  6:16 PM  Result Value Ref Range   Lactic Acid, Venous 1.3 0.5 - 2.0 mmol/L  Glucose, capillary     Status: Abnormal   Collection Time: 09/05/15  9:05 PM  Result Value Ref Range   Glucose-Capillary 155 (H) 65 - 99 mg/dL  Wound culture     Status: None (Preliminary result)   Collection Time: 09/06/15  2:36 AM  Result Value Ref Range   Specimen Description WOUND    Special Requests NONE    Gram Stain PENDING    Culture NO GROWTH < 12 HOURS    Report Status PENDING   Basic metabolic panel  Status: Abnormal   Collection Time: 09/06/15  4:31 AM  Result Value Ref Range   Sodium 142 135 - 145 mmol/L   Potassium 4.2 3.5 - 5.1 mmol/L   Chloride 101 101 - 111 mmol/L   CO2 34 (H) 22 - 32 mmol/L   Glucose, Bld 90 65 - 99 mg/dL   BUN 48 (H) 6 - 20 mg/dL   Creatinine, Ser 2.08 (H) 0.61 -  1.24 mg/dL   Calcium 8.7 (L) 8.9 - 10.3 mg/dL   GFR calc non Af Amer 31 (L) >60 mL/min   GFR calc Af Amer 35 (L) >60 mL/min    Comment: (NOTE) The eGFR has been calculated using the CKD EPI equation. This calculation has not been validated in all clinical situations. eGFR's persistently <60 mL/min signify possible Chronic Kidney Disease.    Anion gap 7 5 - 15  CBC     Status: Abnormal   Collection Time: 09/06/15  4:31 AM  Result Value Ref Range   WBC 7.1 3.8 - 10.6 K/uL   RBC 3.34 (L) 4.40 - 5.90 MIL/uL   Hemoglobin 10.0 (L) 13.0 - 18.0 g/dL   HCT 29.2 (L) 40.0 - 52.0 %   MCV 87.6 80.0 - 100.0 fL   MCH 29.8 26.0 - 34.0 pg   MCHC 34.0 32.0 - 36.0 g/dL   RDW 14.6 (H) 11.5 - 14.5 %   Platelets 88 (L) 150 - 440 K/uL  MRSA PCR Screening     Status: None   Collection Time: 09/06/15  4:33 AM  Result Value Ref Range   MRSA by PCR NEGATIVE NEGATIVE    Comment:        The GeneXpert MRSA Assay (FDA approved for NASAL specimens only), is one component of a comprehensive MRSA colonization surveillance program. It is not intended to diagnose MRSA infection nor to guide or monitor treatment for MRSA infections.   Glucose, capillary     Status: None   Collection Time: 09/06/15  7:38 AM  Result Value Ref Range   Glucose-Capillary 81 65 - 99 mg/dL   Comment 1 Notify RN   Glucose, capillary     Status: Abnormal   Collection Time: 09/06/15  8:57 AM  Result Value Ref Range   Glucose-Capillary 132 (H) 65 - 99 mg/dL   Comment 1 Notify RN   Glucose, capillary     Status: Abnormal   Collection Time: 09/06/15 12:42 PM  Result Value Ref Range   Glucose-Capillary 120 (H) 65 - 99 mg/dL   Comment 1 Notify RN     Dg Chest 2 View  09/05/2015  CLINICAL DATA:  Weakness and shaking EXAM: CHEST  2 VIEW COMPARISON:  November 26, 2014 FINDINGS: There is stable elevation of the right hemidiaphragm. There is no edema or consolidation. Heart is mildly enlarged with pulmonary vascularity within normal  limits. Patient is status post coronary artery bypass grafting. No adenopathy. No bone lesions. IMPRESSION: No edema or consolidation. Heart prominent but stable. Stable elevation of the right hemidiaphragm. Electronically Signed   By: Lowella Grip III M.D.   On: 09/05/2015 13:37   Dg Foot Complete Right  09/05/2015  CLINICAL DATA:  Pain. Chronic right heel ulcer, bilateral lymphedema, recent fever. Renal failure. EXAM: RIGHT FOOT COMPLETE - 3+ VIEW COMPARISON:  None. FINDINGS: Orthopedic hardware across the distal tibiofibular syndesmosis and medial malleolus. Calcaneal spurs. Extensive arterial calcifications. Second through fifth digits are not well profiled. No definite fracture. No dislocation. No subcutaneous gas or  radiodense foreign body identified. IMPRESSION: 1. Chronic and postoperative changes as above, without fracture or other acute bone abnormality. Electronically Signed   By: Lucrezia Europe M.D.   On: 09/05/2015 16:10    Review of Systems  Constitutional: Positive for fever and chills.  HENT: Negative.   Eyes: Negative.   Respiratory: Negative.   Cardiovascular: Positive for leg swelling.  Gastrointestinal: Negative.   Genitourinary: Positive for urgency and frequency.  Musculoskeletal: Negative.   Skin:       Chronic ulceration for the last 4 months on his right heel  Neurological: Negative.   Endo/Heme/Allergies: Negative.   Psychiatric/Behavioral: Negative.    Blood pressure 140/58, pulse 64, temperature 98.7 F (37.1 C), temperature source Oral, resp. rate 20, height $RemoveBe'5\' 10"'EtcYrEeJA$  (1.778 m), weight 124.286 kg (274 lb), SpO2 96 %. Physical Exam  Cardiovascular:  DP pulses 1 over 4 bilateral. PT pulse is trace bilateral, thready and not clearly palpable at times on the right  Musculoskeletal:  Stiff in guarded range of motion in the feet. There is some pain around the posterior aspect of the right heel  Neurological:  Pain sensation intact in the right heel. Some loss of  sensation distally in the toes  Skin:  The skin is warm dry and somewhat atrophic. Some chronic hyperpigmentation's on the legs most likely from edema. A large full-thickness ulceration on the posterior aspect of the right heel measuring approximately 4 cm diameter. A fibrotic base with no active drainage. Mild surrounding erythema but no sign of clinical infection    Assessment/Plan: Assessment: Chronic full-thickness ulceration posterior right heel  Plan: At this point the wound appears dry and stable. Reviewed recommendations from the wound care consult and I agree with their recommendations for Santyl  ointment and dressing changes. At this point no need for surgical debridement of the heel as it does appear stable. Reconsult if any continued problems or new issues.  Kamil Hanigan W. 09/06/2015, 1:12 PM

## 2015-09-06 NOTE — Plan of Care (Signed)
Problem: Discharge Progression Outcomes Goal: Other Discharge Outcomes/Goals Outcome: Progressing Plan of care progress to goal: Pt has pain in right heel with movement and dressing change.  No interventions requested.  Feet elevated and heels off the bed. Wound/ostomy consult in place. VSS Pt remains in bed.

## 2015-09-06 NOTE — Consult Note (Signed)
WOC wound consult note Reason for Consult:Unstageable pressure injury to right heel, stage I device related pressure injury to right ear from oxygen tubing.  Stage 2 pressure injury to left ischium.  Intertriginous dermatitis under abdominal pannus. Wound type:pressure injury to right heel, right ear and left ischium Intertriginous dermatitis to abdominal pannus, present on admission.  Pressure Ulcer POA: Yes Measurement:Right heel 4 cm x 4.2 cm unable to visualize wound bed 100% adherent gray devitalized tissue.  Unstageable pressure injury.  Left ischium stage 2 pressure ulcer 2 cm x 1 cm x 0.1 cm Right ear 0.5 cm erythema present device related pressure injury from oxygen tubing Abdominal pannus with erythema and musty odor.  Tender to touch on left flank.   Drainage (amount, consistency, odor) Right heel minimal serosanguinous drainage.  Periwound:Erythema Dressing procedure/placement/frequency:Cleanse right heel with NS and pat gently dry.  Apply santyl ointment to wound bed.  Cover with NS moist gauze.  Secure with 4x4 gauze, kerlix and tape.  Change daily.  Cleanse left ischium with NS and pat gently dry.  Apply Allevyn silicone foam.  Change every 3 days and PRN soilage. Cleanse right ear with NS and pat dry.  Apply dry gauze between oxygen tubing and ear to offload pressure.  Cleanse under abdominal pannus with soap and water and pat dry.  Remove all powder from under skin fold.  Measure and cut length of InterDry Ag+ to fit in skin folds that have skin breakdown Tuck InterDry  Ag+ fabric into skin folds in a single layer, allow for 2 inches of overhang from skin edges to allow for wicking to occur May remove to bathe; dry area thoroughly and then tuck into affected areas again Do not apply any creams or ointments when using InterDry Ag+ DO NOT THROW AWAY FOR 5 DAYS unless soiled with stool [facility] NOT [facility] product, this will inactivate the silver in the material  New sheet of Interdry Ag+  should be applied after 5 days of use if patient continues to have skin breakdown    Will not follow at this time.  Please re-consult if needed.  Maple HudsonKaren Aarian Griffie RN [name] CWON Pager [phone]

## 2015-09-06 NOTE — Progress Notes (Signed)
ANTIBIOTIC CONSULT NOTE - INITIAL  Pharmacy Consult for vancomycin and piperacillin/tazobactam  Indication: rule out sepsis  No Known Allergies  Patient Measurements: Height: 5\' 10"  (177.8 cm) Weight: 274 lb (124.286 kg) IBW/kg (Calculated) : 73 Adjusted Body Weight: 93 kg  Vital Signs: Temp: 98.7 F (37.1 C) (10/26 1242) Temp Source: Oral (10/26 1242) BP: 140/58 mmHg (10/26 1242) Pulse Rate: 64 (10/26 1242) Intake/Output from previous day: 10/25 0701 - 10/26 0700 In: -  Out: 2300 [Urine:2300] Intake/Output from this shift: Total I/O In: 240 [P.O.:240] Out: 1100 [Urine:1100]  Labs:  Recent Labs  09/05/15 1218 09/06/15 0431  WBC 9.7 7.1  HGB 11.2* 10.0*  PLT 102* 88*  CREATININE 2.29* 2.08*   Estimated Creatinine Clearance: 43.7 mL/min (by C-G formula based on Cr of 2.08). No results for input(s): VANCOTROUGH, VANCOPEAK, VANCORANDOM, GENTTROUGH, GENTPEAK, GENTRANDOM, TOBRATROUGH, TOBRAPEAK, TOBRARND, AMIKACINPEAK, AMIKACINTROU, AMIKACIN in the last 72 hours.   Microbiology: Recent Results (from the past 720 hour(s))  Wound culture     Status: None (Preliminary result)   Collection Time: 09/06/15  2:36 AM  Result Value Ref Range Status   Specimen Description WOUND  Final   Special Requests NONE  Final   Gram Stain PENDING  Incomplete   Culture NO GROWTH < 12 HOURS  Final   Report Status PENDING  Incomplete  MRSA PCR Screening     Status: None   Collection Time: 09/06/15  4:33 AM  Result Value Ref Range Status   MRSA by PCR NEGATIVE NEGATIVE Final    Comment:        The GeneXpert MRSA Assay (FDA approved for NASAL specimens only), is one component of a comprehensive MRSA colonization surveillance program. It is not intended to diagnose MRSA infection nor to guide or monitor treatment for MRSA infections.     Medical History: Past Medical History  Diagnosis Date  . Bacteremia   . GERD (gastroesophageal reflux disease)   . Hematuria   . Urinary  incontinence   . Sleep apnea     on CPAP  . Hyperlipemia   . 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 COPD (chronic obstructive pulmonary disease) ([facility])   . Hypertension   . Lymphedema   . Anxiety disorder   . CHF (congestive heart failure) ([facility])     diastolic dysfunction, last ECHO from 2015, EF 50-55%  . Muscle weakness   . Morbid obesity ([facility])   . Difficulty in walking     bedbound  . Chronic respiratory failure ([facility])     on 2l oxygen continuous  . Diabetes mellitus ([facility])   . Gout   . CKD (chronic kidney disease)     Medications:  Anti-infectives    Start     Dose/Rate Route Frequency Ordered Stop   09/07/15 1000  fluconazole (DIFLUCAN) tablet 150 mg     150 mg Oral Every Thu 09/05/15 1729     09/05/15 2200  piperacillin-tazobactam (ZOSYN) IVPB 3.375 g  Status:  Discontinued     3.375 g 12.5 mL/hr over 240 Minutes Intravenous 3 times per day 09/05/15 1824 09/05/15 1826   09/05/15 2200  piperacillin-tazobactam (ZOSYN) IVPB 4.5 g     4.5 g 25 mL/hr over 240 Minutes Intravenous 3 times per day 09/05/15 1827     09/05/15 2000  vancomycin (VANCOCIN) IVPB 1000 mg/200 mL premix     1,000 mg 200 mL/hr over 60 Minutes Intravenous Every 18 hours 09/05/15 1839  09/05/15 1300  vancomycin (VANCOCIN) IVPB 1000 mg/200 mL premix     1,000 mg 200 mL/hr over 60 Minutes Intravenous  Once 09/05/15 1246 09/05/15 1516   09/05/15 1300  piperacillin-tazobactam (ZOSYN) IVPB 3.375 g     3.375 g 12.5 mL/hr over 240 Minutes Intravenous  Once 09/05/15 1246 09/05/15 1551     Assessment: Pharmacy consulted to dose vancomycin and piperacillin/tazobactam in this 69 year old male presenting to the ED from the nursing home with weakness, fevers, and chills. Pharmacy was consulted to dose for sepsis.   Will use adjusted body weight of 93 kg due to morbid obesity.  CrCl ~ 40 mL/min  Kinetics: Ke=0.038 Half-life: 18.2 hours Vd=65.1L Cmin (calculated)  15.2  Goal of Therapy:  Vancomycin trough level 15-20 mcg/ml  Plan:  Measure antibiotic drug levels at steady state Follow up culture results   Piperacillin/tazobactam 4.5 g IV q8h EI (higher dose chosen since patient weighs >120kg)  Vancomycin 1g dose given in ED Followed by 1g q18h (stacked dose to begin 6 hours after initial dose) Vancomyin trough ordered for 10/27 at 07:30 prior to 4th dose Calculated estimated Cmin to be ~15.2 using population kinetics, but anticipate that patient will accumulate due to morbid obesity.  10/26:  Vancomycin dose scheduled for 14:00 was given ~ 3 hrs late .  Rescheduled subsequent doses and trough for 3 hrs later.   Pharmacy will continue to monitor renal function and vancomycin levels and adjust doses as needed. Thank you for the consult!  Teasha Murrillo D 09/06/2015,5:19 PM

## 2015-09-07 DIAGNOSIS — L0390 Cellulitis, unspecified: Secondary | ICD-10-CM

## 2015-09-07 LAB — GLUCOSE, CAPILLARY
GLUCOSE-CAPILLARY: 129 mg/dL — AB (ref 65–99)
GLUCOSE-CAPILLARY: 144 mg/dL — AB (ref 65–99)
Glucose-Capillary: 98 mg/dL (ref 65–99)

## 2015-09-07 LAB — BASIC METABOLIC PANEL
ANION GAP: 7 (ref 5–15)
BUN: 41 mg/dL — ABNORMAL HIGH (ref 6–20)
CALCIUM: 8.6 mg/dL — AB (ref 8.9–10.3)
CO2: 31 mmol/L (ref 22–32)
Chloride: 102 mmol/L (ref 101–111)
Creatinine, Ser: 1.91 mg/dL — ABNORMAL HIGH (ref 0.61–1.24)
GFR, EST AFRICAN AMERICAN: 39 mL/min — AB (ref >60)
GFR, EST NON AFRICAN AMERICAN: 34 mL/min — AB (ref >60)
GLUCOSE: 124 mg/dL — AB (ref 65–99)
POTASSIUM: 4 mmol/L (ref 3.5–5.1)
Sodium: 140 mmol/L (ref 135–145)

## 2015-09-07 LAB — VANCOMYCIN, TROUGH: Vancomycin Tr: 18 ug/mL (ref 10–20)

## 2015-09-07 MED ORDER — ALPRAZOLAM 0.25 MG PO TABS: 0.2500 mg | ORAL_TABLET | Freq: Three times a day (TID) | ORAL | Status: DC | PRN

## 2015-09-07 MED ORDER — AMOXICILLIN-POT CLAVULANATE 875-125 MG PO TABS: 1.0000 | ORAL_TABLET | Freq: Two times a day (BID) | ORAL | Status: AC

## 2015-09-07 MED ORDER — FINASTERIDE 5 MG PO TABS: 5.0000 mg | ORAL_TABLET | Freq: Every day | ORAL | Status: AC

## 2015-09-07 MED ORDER — OXYCODONE HCL 5 MG PO TABS: 10.0000 mg | ORAL_TABLET | Freq: Four times a day (QID) | ORAL | Status: DC | PRN

## 2015-09-07 NOTE — Discharge Instructions (Signed)
1. Foley can be left in for a week and then discontinue for a voiding trial- If patient cannot void then, he will need a urology follow up and reinsertion of foley 2. Wound care of right heel ulcer as per wound nurse instructions

## 2015-09-07 NOTE — [place]2 (Signed)
[place] MEDICAID FL2 LEVEL OF CARE SCREENING TOOL     IDENTIFICATION  Patient Name: [name] Birthdate: 05-02-1946 Sex: male Admission Date (Current Location): 09/05/2015  [facility] [place]   Facility and Address:  [facility], [address], [place], [place] [address]      Provider Number: [id number]  Attending Physician Name and Address:  Enid Baasadhika Rui Wordell, MD  Relative Name and Phone Number:       Current Level of Care: SNF Recommended Level of Care: Skilled Nursing Facility Prior Approval Number:    Date Approved/Denied:   PASRR Number:    Discharge Plan: SNF    Current Diagnoses: Patient Active Problem List   Diagnosis Date Noted  . Sepsis (HCC) 09/05/2015   Bacteremia    . GERD (gastroesophageal reflux disease)   . Hematuria   . Urinary incontinence   . Sleep apnea     on CPAP  . Hyperlipemia   . 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 COPD (chronic obstructive pulmonary disease) (HCC)   . Hypertension   . Lymphedema   . Anxiety disorder   . CHF (congestive heart failure) (HCC)     diastolic dysfunction, last ECHO from 2015, EF 50-55%  . Muscle weakness   . Morbid obesity (HCC)   . Difficulty in walking     bedbound  . Chronic respiratory failure (HCC)     on 2l oxygen continuous  . Diabetes mellitus (HCC)   . Gout   . CKD (chronic kidney disease)          Orientation ACTIVITIES/SOCIAL BLADDER RESPIRATION    Self, Situation  Family supportive Incontinent CPAP, 2L chronic  BEHAVIORAL SYMPTOMS/MOOD NEUROLOGICAL BOWEL NUTRITION STATUS      Incontinent Diet  PHYSICIAN VISITS COMMUNICATION OF NEEDS Height & Weight Skin  60 days Verbally   274 lbs. Wounds   See below      AMBULATORY STATUS RESPIRATION    Assist extensive CPAP, 2L chronic      Personal Care Assistance Level  of Assistance  Bathing, Dressing            Functional Limitations Info      Hearing Info: Adequate         SPECIAL CARE FACTORS FREQUENCY                      Additional Factors Info  Code Status Code Status Info: DNR             Current Medications (09/07/2015): Current Facility-Administered Medications  Medication Dose Route Frequency Provider Last Rate Last Dose  . 0.9 %  sodium chloride infusion   Intravenous Continuous Enid Baasadhika River Mckercher, MD 75 mL/hr at 09/06/15 2131    . acetaminophen (TYLENOL) tablet 650 mg  650 mg Oral Q6H PRN Enid Baasadhika Thoren Hosang, MD       Or  . acetaminophen (TYLENOL) suppository 650 mg  650 mg Rectal Q6H PRN Enid Baasadhika Jerelyn Trimarco, MD      . allopurinol (ZYLOPRIM) tablet 100 mg  100 mg Oral QHS Enid Baasadhika Dutchess Crosland, MD   100 mg at 09/06/15 2253  . ALPRAZolam (XANAX) tablet 0.25 mg  0.25 mg Oral Q8H PRN Enid Baasadhika Kaeley Vinje, MD      . aspirin chewable tablet 324 mg  324 mg Oral Once Gayla DossEryka A Gayle, MD   324 mg at 09/05/15 1759  . collagenase (SANTYL) ointment 1 application  1 application Topical Daily Alwyn Cordner  Nemiah Commander, MD   1 application at 09/06/15 1330  . dextromethorphan (DELSYM) 30 MG/5ML liquid 60 mg  60 mg Oral Q12H PRN Enid Baas, MD      . divalproex (DEPAKOTE) DR tablet 250 mg  250 mg Oral BID Enid Baas, MD   250 mg at 09/07/15 0934  . finasteride (PROSCAR) tablet 5 mg  5 mg Oral Daily Delfino Lovett, MD   5 mg at 09/07/15 0920  . fluconazole (DIFLUCAN) tablet 150 mg  150 mg Oral Q Thu Enid Baas, MD   150 mg at 09/07/15 0933  . fluticasone (FLONASE) 50 MCG/ACT nasal spray 1 spray  1 spray Each Nare Daily Enid Baas, MD   1 spray at 09/06/15 1150  . gabapentin (NEURONTIN) capsule 400 mg  400 mg Oral TID Enid Baas, MD   400 mg at 09/07/15 0921  . guaiFENesin (MUCINEX) 12 hr tablet 600 mg  600 mg Oral Q12H Enid Baas, MD   600 mg at 09/07/15 0920  . heparin injection 5,000 Units  5,000 Units Subcutaneous 3  times per day Enid Baas, MD   5,000 Units at 09/07/15 4098  . HYDROcodone-acetaminophen (NORCO/VICODIN) 5-325 MG per tablet 1-2 tablet  1-2 tablet Oral Q4H PRN Enid Baas, MD   1 tablet at 09/07/15 [phone]  . insulin aspart (novoLOG) injection 0-5 Units  0-5 Units Subcutaneous QHS Enid Baas, MD   0 Units at 09/05/15 2132  . insulin aspart (novoLOG) injection 0-9 Units  0-9 Units Subcutaneous TID WC Enid Baas, MD   1 Units at 09/06/15 1653  . insulin aspart protamine- aspart (NOVOLOG MIX 70/30) injection 15 Units  15 Units Subcutaneous Q breakfast Enid Baas, MD   15 Units at 09/07/15 0824  . insulin aspart protamine- aspart (NOVOLOG MIX 70/30) injection 25 Units  25 Units Subcutaneous QHS Enid Baas, MD   25 Units at 09/05/15 2143  . ipratropium-albuterol (DUONEB) 0.5-2.5 (3) MG/3ML nebulizer solution 3 mL  3 mL Nebulization BID Enid Baas, MD   3 mL at 09/07/15 0749  . loperamide (IMODIUM) capsule 2 mg  2 mg Oral Q4H PRN Enid Baas, MD      . loratadine (CLARITIN) tablet 10 mg  10 mg Oral Daily Enid Baas, MD   10 mg at 09/07/15 0920  . metoprolol (LOPRESSOR) tablet 50 mg  50 mg Oral Daily Enid Baas, MD   50 mg at 09/07/15 4782  . morphine 2 MG/ML injection 2 mg  2 mg Intravenous Q4H PRN Enid Baas, MD      . nystatin (MYCOSTATIN/NYSTOP) topical powder 1 g  1 g Topical TID Enid Baas, MD   1 g at 09/06/15 2255  . ondansetron (ZOFRAN) tablet 4 mg  4 mg Oral Q6H PRN Enid Baas, MD       Or  . ondansetron (ZOFRAN) injection 4 mg  4 mg Intravenous Q6H PRN Enid Baas, MD      . pantoprazole (PROTONIX) EC tablet 40 mg  40 mg Oral BID Enid Baas, MD   40 mg at 09/06/15 2253  . piperacillin-tazobactam (ZOSYN) IVPB 4.5 g  4.5 g Intravenous 3 times per day Cindi Carbon, RPH   4.5 g at 09/07/15 9562  . polyethylene glycol (MIRALAX / GLYCOLAX) packet 17 g  17 g Oral Daily PRN Enid Baas, MD      .  sertraline (ZOLOFT) tablet 150 mg  150 mg Oral Daily Enid Baas, MD   150 mg at 09/07/15 0934  . tamsulosin (FLOMAX)  capsule 0.4 mg  0.4 mg Oral Daily Enid Baas, MD   0.4 mg at 09/07/15 0921  . traZODone (DESYREL) tablet 25 mg  25 mg Oral QHS Enid Baas, MD   25 mg at 09/06/15 2253  . vancomycin (VANCOCIN) IVPB 1000 mg/200 mL premix  1,000 mg Intravenous Q18H Cindi Carbon, RPH   1,000 mg at 09/06/15 1644   Do not use this list as official medication orders. Please verify with discharge summary.  Discharge Medications:   Medication List    ASK your doctor about these medications        acetaminophen 500 MG tablet  Commonly known as:  TYLENOL  Take 1,000 mg by mouth every 4 (four) hours as needed for fever.     allopurinol 100 MG tablet  Commonly known as:  ZYLOPRIM  Take 100 mg by mouth at bedtime.     ALPRAZolam 0.25 MG tablet  Commonly known as:  XANAX  Take 0.25 mg by mouth every 8 (eight) hours as needed for anxiety.     BIOFREEZE EX  Apply 1 application topically as needed (for pain).     collagenase ointment  Commonly known as:  SANTYL  Apply 1 application topically daily. Pt applies to right heel.     DELSYM 30 MG/5ML liquid  Generic drug:  dextromethorphan  Take 60 mg by mouth every 12 (twelve) hours as needed for cough.     divalproex 125 MG DR tablet  Commonly known as:  DEPAKOTE  Take 250 mg by mouth 2 (two) times daily.     fluconazole 150 MG tablet  Commonly known as:  DIFLUCAN  Take 150 mg by mouth once a week. Pt takes on Thursday.     fluticasone 50 MCG/ACT nasal spray  Commonly known as:  FLONASE  Place 1 spray into both nostrils daily.     furosemide 20 MG tablet  Commonly known as:  LASIX  Take 60 mg by mouth 2 (two) times daily.     gabapentin 400 MG capsule  Commonly known as:  NEURONTIN  Take 400 mg by mouth 3 (three) times daily.     guaiFENesin 600 MG 12 hr tablet  Commonly known as:  MUCINEX  Take 600 mg by mouth  every 12 (twelve) hours.     insulin NPH-regular Human (70-30) 100 UNIT/ML injection  Commonly known as:  NOVOLIN 70/30  Inject 15-25 Units into the skin 2 (two) times daily. Pt uses 15 units in the morning and 25 units at bedtime.     ipratropium-albuterol 0.5-2.5 (3) MG/3ML Soln  Commonly known as:  DUONEB  Take 3 mLs by nebulization 2 (two) times daily.     loperamide 2 MG capsule  Commonly known as:  IMODIUM  Take 2 mg by mouth every 4 (four) hours as needed for diarrhea or loose stools.     loratadine 10 MG tablet  Commonly known as:  CLARITIN  Take 10 mg by mouth daily.     metoprolol 50 MG tablet  Commonly known as:  LOPRESSOR  Take 50 mg by mouth daily.     MILK OF MAGNESIA 400 MG/5ML suspension  Generic drug:  magnesium hydroxide  Take 30 mLs by mouth daily as needed for mild constipation.     nystatin 100000 UNIT/GM Powd  Apply 1 g topically 3 (three) times daily.     omeprazole 20 MG capsule  Commonly known as:  PRILOSEC  Take 20 mg by mouth 2 (two) times  daily.     oxyCODONE 5 MG immediate release tablet  Commonly known as:  Oxy IR/ROXICODONE  Take 10 mg by mouth 3 (three) times daily. Pt is also able to use every four hours PRN.     sertraline 100 MG tablet  Commonly known as:  ZOLOFT  Take 150 mg by mouth daily.     tamsulosin 0.4 MG Caps capsule  Commonly known as:  FLOMAX  Take 0.4 mg by mouth daily.     traZODone 25 mg Tabs tablet  Commonly known as:  DESYREL  Take 25 mg by mouth at bedtime.        Relevant Imaging Results:  Relevant Lab Results:  Recent Labs    Additional Information    Pressure Ulcer POA: Yes Measurement:Right heel 4 cm x 4.2 cm unable to visualize wound bed 100% adherent gray devitalized tissue. Unstageable pressure injury.  Left ischium stage 2 pressure ulcer 2 cm x 1 cm x 0.1 cm Right ear 0.5 cm erythema present device related pressure injury from oxygen tubing Abdominal pannus with erythema and musty odor.  Tender to touch on left flank.   Drainage (amount, consistency, odor) Right heel minimal serosanguinous drainage.  Periwound:Erythema Dressing procedure/placement/frequency:Cleanse right heel with NS and pat gently dry. Apply santyl ointment to wound bed. Cover with NS moist gauze. Secure with 4x4 gauze, kerlix and tape. Change daily.  Cleanse left ischium with NS and pat gently dry. Apply Allevyn silicone foam. Change every 3 days and PRN soilage. Cleanse right ear with NS and pat dry. Apply dry gauze between oxygen tubing and ear to offload pressure.  Cleanse under abdominal pannus with soap and water and pat dry. Remove all powder from under skin fold.  Measure and cut length of InterDry Ag+ to fit in skin folds that have skin breakdown Tuck InterDry Ag+ fabric into skin folds in a single layer, allow for 2 inches of overhang from skin edges to allow for wicking to occur May remove to bathe; dry area thoroughly and then tuck into affected areas again Do not apply any creams or ointments when using InterDry Ag+ DO NOT THROW AWAY FOR 5 DAYS unless soiled with stool DO NOT [facility] product, this will inactivate the silver in the material  New sheet of Interdry Ag+ should be applied after 5 days of use if patient continues to have skin breakdown         Ned Card, LCSW

## 2015-09-07 NOTE — Progress Notes (Signed)
CSW was notified that Pt has been medically cleared [facility]. CSW sent dc packet electronically, including dc summary. RN to call report and EMS for transport.   Wilford Gristara Bryston Colocho, LCSW [phone]

## 2015-09-07 NOTE — Progress Notes (Signed)
Pt will be discharged to facility [facility], report has been called. Awaiting on EMS to arrive
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
## 2015-09-07 NOTE — Clinical Social Work Note (Signed)
Clinical Social Work Assessment  Patient Details  Name: [name] MRN: [id number] Date of Birth: Jan 07, 1946  Date of referral:  09/06/15               Reason for consult:  Facility Placement                Permission sought to share information with:  Family Supports, Oceanographeracility Contact Representative Permission granted to share information::  Yes, Verbal Permission Granted  Name::     [facility]/sister  Agency::     Relationship::     Contact Information:     Housing/Transportation Living arrangements for the past 2 months:  Skilled Building surveyorursing Facility Source of Information:  Patient, Development worker, communityMedical Team, Facility Patient Interpreter Needed:  None Criminal Activity/Legal Involvement Pertinent to Current Situation/Hospitalization:  No - Comment as needed Significant Relationships:  Siblings Lives with:  Facility Resident Do you feel safe going back to the place where you live?  Yes Need for family participation in patient care:  Yes (Comment)  Care giving concerns:  [facility] for a [facility].   Social Worker assessment / plan:  Clinical Social Worker was referred to Pt to assist with dc planning. [facility] the last year. Pt's sister is his main support. Pt is followed by hospice at SNF, plan is to return to SNF at dc.  Pt's sister is aware of hospitalization per patient.    Employment status:  Retired Database administratornsurance information:  Managed Medicare PT Recommendations:  Not assessed at this time Information / Referral to community resources:     Patient/Family's Response to care:  Pt pleased with care in hospital, no concerns. No family in room during CSW visits, CSW attempted to reach sister. Wife's phone out of service.  Patient/Family's Understanding of and Emotional Response to Diagnosis, Current Treatment, and Prognosis:  Pt is engage with treatment team.  Emotional  Assessment Appearance:  Appears stated age Attitude/Demeanor/Rapport:   (pleasant) Affect (typically observed):  Accepting, Adaptable Orientation:  Oriented to Self, Oriented to Place, Oriented to Situation Alcohol / Substance use:  Never Used Psych involvement (Current and /or in the community):  No (Comment)  Discharge Needs  Concerns to be addressed:  Adjustment to Illness Readmission within the last 30 days:  No Current discharge risk:  Chronically ill Barriers to Discharge:  Barriers Resolved   Ned Cardara N Brayton Baumgartner, LCSW 09/07/2015, 2:22 PM

## 2015-09-07 NOTE — Progress Notes (Signed)
Visit made. Patient in bed, alert watching television. Patient complained of pain in his left hip, buttocks. Pateint repositioned with help of staff aide AYO, this provided relief briefly, patient then requested PRN pain medication. Patient with new dressing to right foot. Writer made staff RN Lillia AbedLindsay aware that patient needed his InterDry applied to his pannus folds per wound care consult orders as well as need for PRN pain medications. InterDry was to replace nystatin powder. Discharge information faxed to triage, hospice team alerted to discharge.  Dayna BarkerKaren Robertson RN, [name], [facility], [facility] Liaison [phone] c

## 2015-09-07 NOTE — Progress Notes (Signed)
ANTIBIOTIC CONSULT NOTE - INITIAL  Pharmacy Consult for vancomycin and piperacillin/tazobactam  Indication: rule out sepsis  No Known Allergies  Patient Measurements: Height:  (177.8 cm) Weight: 274 lb (124.286 kg) IBW/kg (Calculated) : 73 Adjusted Body Weight: 93 kg  Vital Signs: Temp: 97.7 F (36.5 C) (10/27 0441) Temp Source: Oral (10/27 0441) BP: 121/59 mmHg (10/27 0920) Pulse Rate: 82 (10/27 0920) Intake/Output from previous day: 10/26 0701 - 10/27 0700 In: 480 [P.O.:480] Out: 2250 [Urine:2250] Intake/Output from this shift: Total I/O In: -  Out: 450 [Urine:450]  Labs:  Recent Labs  09/05/15 1218 09/06/15 0431 09/07/15 1025  WBC 9.7 7.1  --   HGB 11.2* 10.0*  --   PLT 102* 88*  --   CREATININE 2.29* 2.08* 1.91*   Estimated Creatinine Clearance: 47.6 mL/min (by C-G formula based on Cr of 1.91).  Recent Labs  09/07/15 1025  VANCOTROUGH 18     Microbiology: Recent Results (from the past 720 hour(s))  Urine culture     Status: None (Preliminary result)   Collection Time: 09/05/15  3:40 PM  Result Value Ref Range Status   Specimen Description URINE, RANDOM  Final   Special Requests NONE  Final   Culture   Final    80,000 COLONIES/ml PSEUDOMONAS AERUGINOSA SUSCEPTIBILITIES TO FOLLOW    Report Status PENDING  Incomplete  Wound culture     Status: None (Preliminary result)   Collection Time: 09/06/15  2:36 AM  Result Value Ref Range Status   Specimen Description WOUND  Final   Special Requests NONE  Final   Gram Stain FEW WBC SEEN MODERATE GRAM NEGATIVE RODS   Final   Culture   Final    LIGHT GROWTH GRAM NEGATIVE RODS IDENTIFICATION AND SUSCEPTIBILITIES TO FOLLOW    Report Status PENDING  Incomplete  MRSA PCR Screening     Status: None   Collection Time: 09/06/15  4:33 AM  Result Value Ref Range Status   MRSA by PCR NEGATIVE NEGATIVE Final    Comment:        The GeneXpert MRSA Assay (FDA approved for NASAL specimens only), is one  component of a comprehensive MRSA colonization surveillance program. It is not intended to diagnose MRSA infection nor to guide or monitor treatment for MRSA infections.     Medical History: Past Medical History  Diagnosis Date  . Bacteremia   . GERD (gastroesophageal reflux disease)   . Hematuria   . Urinary incontinence   . Sleep apnea     on CPAP  . Hyperlipemia   . 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 COPD (chronic obstructive pulmonary disease) (HCC)   . Hypertension   . Lymphedema   . Anxiety disorder   . CHF (congestive heart failure) (HCC)     diastolic dysfunction, last ECHO from 2015, EF 50-55%  . Muscle weakness   . Morbid obesity (HCC)   . Difficulty in walking     bedbound  . Chronic respiratory failure (HCC)     on 2l oxygen continuous  . Diabetes mellitus (HCC)   . Gout   . CKD (chronic kidney disease)     Medications:  Anti-infectives    Start     Dose/Rate Route Frequency Ordered Stop   09/07/15 1000  fluconazole (DIFLUCAN) tablet 150 mg     150 mg Oral Every Thu 09/05/15 1729     09/07/15 0000  amoxicillin-clavulanate (AUGMENTIN) 875-125 MG tablet  Comments:  For 9 days   1 tablet Oral 2 times daily 09/07/15 1030 09/16/15 2359   09/05/15 2200  piperacillin-tazobactam (ZOSYN) IVPB 3.375 g  Status:  Discontinued     3.375 g 12.5 mL/hr over 240 Minutes Intravenous 3 times per day 09/05/15 1824 09/05/15 1826   09/05/15 2200  piperacillin-tazobactam (ZOSYN) IVPB 4.5 g     4.5 g 25 mL/hr over 240 Minutes Intravenous 3 times per day 09/05/15 1827     09/05/15 2000  vancomycin (VANCOCIN) IVPB 1000 mg/200 mL premix     1,000 mg 200 mL/hr over 60 Minutes Intravenous Every 18 hours 09/05/15 1839     09/05/15 1300  vancomycin (VANCOCIN) IVPB 1000 mg/200 mL premix     1,000 mg 200 mL/hr over 60 Minutes Intravenous  Once 09/05/15 1246 09/05/15 1516   09/05/15 1300  piperacillin-tazobactam (ZOSYN) IVPB 3.375  g     3.375 g 12.5 mL/hr over 240 Minutes Intravenous  Once 09/05/15 1246 09/05/15 1551     Assessment: Pharmacy consulted to dose vancomycin and piperacillin/tazobactam in this 69 year old male presenting to the ED from the nursing home with weakness, fevers, and chills. Pharmacy was consulted to dose for sepsis.   Will use adjusted body weight of 93 kg due to morbid obesity.  CrCl ~ 40 mL/min  Kinetics: Ke=0.038 Half-life: 18.2 hours Vd=65.1L Cmin (calculated) 15.2  Goal of Therapy:  Vancomycin trough level 15-20 mcg/ml  Plan:  Measure antibiotic drug levels at steady state Follow up culture results   Piperacillin/tazobactam 4.5 g IV q8h EI (higher dose chosen since patient weighs >120kg)  Vancomycin 1g dose given in ED Followed by 1g q18h (stacked dose to begin 6 hours after initial dose) Vancomyin trough ordered for 10/27 at 07:30 prior to 4th dose Calculated estimated Cmin to be ~15.2 using population kinetics, but anticipate that patient will accumulate due to morbid obesity.  10/26:  Vancomycin dose scheduled for 14:00 was given ~ 3 hrs late .  Rescheduled subsequent doses and trough for 3 hrs later.   1027 VT=18 pt is being discharged. vanc d/c and augmentin started     Greyden Besecker D Lauree Yurick 09/07/2015,1:07 PM

## 2015-09-07 NOTE — Plan of Care (Signed)
Problem: Discharge Progression Outcomes Goal: Other Discharge Outcomes/Goals Outcome: Progressing Plan of care progress to goal: Pt remains in bed Dressings changes added from wound consult on daily basis No request for pain meds this shift VSS Continue to elevate heels off bed.  Reposition pt as requested.

## 2015-09-07 NOTE — Discharge Planning (Signed)
[facility] going back [facility] today, report called.Vital signs stable and skin intact. enterdry applied to underneath stomach. [facility]. Case management is going to talk to daughter

## 2015-09-08 LAB — URINE CULTURE

## 2015-09-09 LAB — WOUND CULTURE

## 2015-09-10 LAB — CULTURE, BLOOD (ROUTINE X 2)
CULTURE: NO GROWTH
CULTURE: NO GROWTH

## 2015-09-17 ENCOUNTER — Encounter: Payer: Self-pay | Admitting: Infectious Diseases

## 2016-01-01 ENCOUNTER — Encounter: Payer: Medicare Other | Attending: Surgery | Admitting: Surgery

## 2016-01-01 DIAGNOSIS — L89613 Pressure ulcer of right heel, stage 3: Secondary | ICD-10-CM | POA: Diagnosis not present

## 2016-01-01 DIAGNOSIS — J449 Chronic obstructive pulmonary disease, unspecified: Secondary | ICD-10-CM | POA: Diagnosis not present

## 2016-01-01 DIAGNOSIS — N189 Chronic kidney disease, unspecified: Secondary | ICD-10-CM | POA: Diagnosis not present

## 2016-01-01 DIAGNOSIS — I2510 Atherosclerotic heart disease of native coronary artery without angina pectoris: Secondary | ICD-10-CM | POA: Insufficient documentation

## 2016-01-01 DIAGNOSIS — E785 Hyperlipidemia, unspecified: Secondary | ICD-10-CM | POA: Diagnosis not present

## 2016-01-01 DIAGNOSIS — M1990 Unspecified osteoarthritis, unspecified site: Secondary | ICD-10-CM | POA: Diagnosis not present

## 2016-01-01 DIAGNOSIS — G4730 Sleep apnea, unspecified: Secondary | ICD-10-CM | POA: Diagnosis not present

## 2016-01-01 DIAGNOSIS — I110 Hypertensive heart disease with heart failure: Secondary | ICD-10-CM | POA: Insufficient documentation

## 2016-01-01 DIAGNOSIS — E11621 Type 2 diabetes mellitus with foot ulcer: Secondary | ICD-10-CM | POA: Insufficient documentation

## 2016-01-01 DIAGNOSIS — F329 Major depressive disorder, single episode, unspecified: Secondary | ICD-10-CM | POA: Insufficient documentation

## 2016-01-01 DIAGNOSIS — K219 Gastro-esophageal reflux disease without esophagitis: Secondary | ICD-10-CM | POA: Insufficient documentation

## 2016-01-01 DIAGNOSIS — Z87891 Personal history of nicotine dependence: Secondary | ICD-10-CM | POA: Diagnosis not present

## 2016-01-01 DIAGNOSIS — L89322 Pressure ulcer of left buttock, stage 2: Secondary | ICD-10-CM | POA: Diagnosis not present

## 2016-01-01 DIAGNOSIS — E1122 Type 2 diabetes mellitus with diabetic chronic kidney disease: Secondary | ICD-10-CM | POA: Insufficient documentation

## 2016-01-01 DIAGNOSIS — I5032 Chronic diastolic (congestive) heart failure: Secondary | ICD-10-CM | POA: Insufficient documentation

## 2016-01-01 DIAGNOSIS — I129 Hypertensive chronic kidney disease with stage 1 through stage 4 chronic kidney disease, or unspecified chronic kidney disease: Secondary | ICD-10-CM | POA: Insufficient documentation

## 2016-01-01 DIAGNOSIS — E1140 Type 2 diabetes mellitus with diabetic neuropathy, unspecified: Secondary | ICD-10-CM | POA: Insufficient documentation

## 2016-01-01 DIAGNOSIS — M109 Gout, unspecified: Secondary | ICD-10-CM | POA: Insufficient documentation

## 2016-01-01 DIAGNOSIS — I890 Lymphedema, not elsewhere classified: Secondary | ICD-10-CM | POA: Insufficient documentation

## 2016-01-02 NOTE — Progress Notes (Signed)
[name], [name] ([id number]) Visit Report for 01/01/2016 Allergy List Details Patient Name: [name] Date of Service: 01/01/2016 12:45 PM Medical Record Number: [id number] Patient Account Number: [id number] Date of Birth/Sex: Feb 02, 1946 (70 y.o. Male) Treating RN: Phillis Haggis Primary Care Physician: Oretha Milch Other Clinician: Referring Physician: Treating Physician/Extender: Rudene Re in Treatment: 0 Electronic Signature(s) Signed: 01/02/2016 8:19:40 AM By: Alejandro Mulling Entered By: Alejandro Mulling on 01/01/2016 13:03:33 [name] ([id number]) -------------------------------------------------------------------------------- Arrival Information Details Patient Name: [name] Date of Service: 01/01/2016 12:45 PM Medical Record Number: [id number] Patient Account Number: [id number] Date of Birth/Sex: Dec 18, 1945 (70 y.o. Male) Treating RN: Phillis Haggis Primary Care Physician: Oretha Milch Other Clinician: Referring Physician: Treating Physician/Extender: Rudene Re in Treatment: 0 Visit Information Patient Arrived: Wheel Chair Arrival Time: 12:58 Accompanied By: self Transfer Assistance: EasyPivot Patient Lift Patient Identification Verified: Yes Secondary Verification Process Yes Completed: Patient Requires Transmission- No Based Precautions: Patient Has Alerts: Yes Patient Alerts: DM II Electronic Signature(s) Signed: 01/02/2016 8:19:40 AM By: Alejandro Mulling Entered By: Alejandro Mulling on 01/01/2016 12:59:54 [name] ([id number]) -------------------------------------------------------------------------------- Clinic Level of Care Assessment Details Patient Name: [name] Date of Service: 01/01/2016 12:45 PM Medical Record Number: [id number] Patient Account Number: [id number] Date of Birth/Sex: 07/01/1946 (70 y.o. Male) Treating RN: Phillis Haggis Primary Care Physician: Oretha Milch Other  Clinician: Referring Physician: Treating Physician/Extender: Rudene Re in Treatment: 0 Clinic Level of Care Assessment Items TOOL 2 Quantity Score X - Use when only an EandM is performed on the INITIAL visit 1 0 ASSESSMENTS - Nursing Assessment / Reassessment []  - General Physical Exam (combine w/ comprehensive assessment (listed just 0 below) when performed on new pt. evals) X - Comprehensive Assessment (HX, ROS, Risk Assessments, Wounds Hx, etc.) 1 25 ASSESSMENTS - Wound and Skin Assessment / Reassessment []  - Simple Wound Assessment / Reassessment - one wound 0 X - Complex Wound Assessment / Reassessment - multiple wounds 3 5 []  - Dermatologic / Skin Assessment (not related to wound area) 0 ASSESSMENTS - Ostomy and/or Continence Assessment and Care []  - Incontinence Assessment and Management 0 []  - Ostomy Care Assessment and Management (repouching, etc.) 0 PROCESS - Coordination of Care []  - Simple Patient / Family Education for ongoing care 0 X - Complex (extensive) Patient / Family Education for ongoing care 1 20 []  - Staff obtains Chiropractor, Records, Test Results / Process Orders 0 X - Staff telephones HHA, Nursing Homes / Clarify orders / etc 1 10 []  - Routine Transfer to another Facility (non-emergent condition) 0 []  - Routine Hospital Admission (non-emergent condition) 0 X - New Admissions / Manufacturing engineer / Ordering NPWT, Apligraf, etc. 1 15 []  - Emergency Hospital Admission (emergent condition) 0 X - Simple Discharge Coordination 1 10 Gigante, Yahshua V. ([id number]) []  - Complex (extensive) Discharge Coordination 0 PROCESS - Special Needs []  - Pediatric / Minor Patient Management 0 []  - Isolation Patient Management 0 []  - Hearing / Language / Visual special needs 0 []  - Assessment of Community assistance (transportation, D/C planning, etc.) 0 []  - Additional assistance / Altered mentation 0 []  - Support Surface(s) Assessment (bed, cushion, seat, etc.)  0 INTERVENTIONS - Wound Cleansing / Measurement X - Wound Imaging (photographs - any number of wounds) 1 5 []  - Wound Tracing (instead of photographs) 0 []  - Simple Wound Measurement - one wound 0 X - Complex Wound Measurement - multiple wounds 3 5 []  - Simple Wound Cleansing - one wound 0  X - Complex Wound Cleansing - multiple wounds 3 5 INTERVENTIONS - Wound Dressings []  - Small Wound Dressing one or multiple wounds 0 X - Medium Wound Dressing one or multiple wounds 1 15 X - Large Wound Dressing one or multiple wounds 2 20 X - Application of Medications - injection 1 10 INTERVENTIONS - Miscellaneous  - External ear exam 0  - Specimen Collection (cultures, biopsies, blood, body fluids, etc.) 0  - Specimen(s) / Culture(s) sent or taken to Lab for analysis 0  - Patient Transfer (multiple staff / Nurse, adult / Similar devices) 0  - Simple Staple / Suture removal (25 or less) 0  - Complex Staple / Suture removal (26 or more) 0 Wyrick, Hugh V. ([id number])  - Hypo / Hyperglycemic Management (close monitor of Blood Glucose) 0 X - Ankle / Brachial Index (ABI) - do not check if billed separately 1 15 Has the patient been seen at the hospital within the last three years: Yes Total Score: 210 Level Of Care: New/Established - Level 5 Electronic Signature(s) Unsigned Entered By: Alejandro Mulling on 01/02/2016 12:58:14 Signature(s): Date(s): [name] ([id number]) -------------------------------------------------------------------------------- Encounter Discharge Information Details Patient Name: [name] Date of Service: 01/01/2016 12:45 PM Medical Record Number: [id number] Patient Account Number: [id number] Date of Birth/Sex: 1946-05-09 (70 y.o. Male) Treating RN: Phillis Haggis Primary Care Physician: Oretha Milch Other Clinician: Referring Physician: Treating Physician/Extender: Rudene Re in Treatment: 0 Encounter Discharge Information  Items Discharge Pain Level: 0 Discharge Condition: Stable Ambulatory Status: Wheelchair Discharge Destination: Nursing Home Transportation: Other Accompanied By: self Schedule Follow-up Appointment: Yes Medication Reconciliation completed and provided to Patient/Care Yes Elsia Lasota: Provided on Clinical Summary of Care: 01/01/2016 Form Type Recipient Paper Patient EL Electronic Signature(s) Signed: 01/01/2016 3:22:59 PM By: Gwenlyn Perking Entered By: Gwenlyn Perking on 01/01/2016 15:22:59 Benassi, Kenton Seth Bake ([id number]) -------------------------------------------------------------------------------- Lower Extremity Assessment Details Patient Name: [name] Date of Service: 01/01/2016 12:45 PM Medical Record Number: [id number] Patient Account Number: [id number] Date of Birth/Sex: 1945/11/25 (70 y.o. Male) Treating RN: Ashok Cordia, Debi Primary Care Physician: Oretha Milch Other Clinician: Referring Physician: Treating Physician/Extender: Rudene Re in Treatment: 0 Edema Assessment Assessed: [Left: No] [Right: No] Edema: [Left: Yes] [Right: Yes] Calf Left: Right: Point of Measurement: 34 cm From Medial Instep 37.6 cm 41.5 cm Ankle Left: Right: Point of Measurement: 12 cm From Medial Instep 22.5 cm 22.5 cm Vascular Assessment Pulses: Posterior Tibial Dorsalis Pedis Palpable: [Left:No] [Right:No] Extremity colors, hair growth, and conditions: Extremity Color: [Left:Hyperpigmented] [Right:Hyperpigmented] Hair Growth on Extremity: [Left:No] [Right:No] Temperature of Extremity: [Left:Warm] [Right:Warm] Capillary Refill: [Left:> 3 seconds] [Right:> 3 seconds] Blood Pressure: Brachial: [Left:147] [Right:147] Toe Nail Assessment Left: Right: Thick: Yes Yes Discolored: Yes Yes Deformed: Yes Yes Improper Length and Hygiene: Yes Yes Notes ref to ABI's pt is non-comprressible Electronic Signature(s) Signed: 01/02/2016 8:19:40 AM By: Evette Georges, Mayan  V. ([id number]) Entered By: Alejandro Mulling on 01/01/2016 14:08:16 [name] ([id number]) -------------------------------------------------------------------------------- Multi Wound Chart Details Patient Name: [name] Date of Service: 01/01/2016 12:45 PM Medical Re Male) Treating RN: Phillis Haggis Primary Care Physician: Oretha Milch Other Clinician: Referring Physician: Treating Physician/Extender: Rudene Re in Treatment: 0 Vital Signs Height(in): 70 Pulse(bpm): 96 Weight(lbs): 235 Blood Pressure 147/92 (mmHg): Body Mass Index(BMI): 34 Temperature(F): 98.0 Respiratory Rate 20 (breaths/min): Photos: [1:No Photos] [2:No Photos] [3:No Photos] Wound Location: [1:Left Lower Leg] [2:Right Lower Leg] [3:Right Calcaneus] Wounding Event: [  1:Gradually Appeared] [2:Gradually Appeared] [3:Pressure Injury] Primary Etiology: [1:To be determined] [2:To be determined] [3:Pressure Ulcer] Comorbid History: [1:Cataracts, Chronic Obstructive Pulmonary Disease (COPD), Sleep Disease (COPD), Sleep Disease (COPD), Sleep Apnea, Angina, Congestive Heart Failure, Congestive Heart Failure, Congestive Heart Failure, Hypertension, Type II Diabetes,  Gout, Osteoarthritis, Neuropathy Osteoarthritis, Neuropathy Osteoarthritis, Neuropathy] [2:Cataracts, Chronic Obstructive Pulmonary Apnea, Angina, Hypertension, Type II Diabetes, Gout,] [3:Cataracts, Chronic Obstructive Pulmonary Apnea, Angina,  Hypertension, Type II Diabetes, Gout,] Date Acquired: [1:12/31/2014] [2:12/31/2014] [3:10/31/2015] Weeks of Treatment: [1:0] [2:0] [3:0] Wound Status: [1:Open] [2:Open] [3:Open] Measurements L x W x D 12x2.6x0.1 [2:1.5x0.2x0.1] [3:3x3x0.2] (cm) Area (cm) : [1:24.504] [2:0.236] [3:7.069] Volume (cm) : [1:2.45] [2:0.024] [3:1.414] Classification: [1:Partial Thickness] [2:Partial Thickness]  [3:Category/Stage III] HBO Classification: [1:Grade 1] [2:Grade 1] [3:Grade 1] Exudate Amount: [1:Large] [2:Large] [3:Large] Exudate Type: [1:Serosanguineous] [2:Serosanguineous] [3:Serosanguineous] Exudate Color: [1:red, brown] [2:red, brown] [3:red, brown] Wound Margin: [1:Flat and Intact] [2:Flat and Intact] [3:Flat and Intact] Granulation Amount: [1:Medium (34-66%)] [2:Small (1-33%)] [3:Small (1-33%)] Granulation Quality: [1:Red, Pink] [2:Red, Pink] [3:Red, Pink] Necrotic Amount: [1:Medium (34-66%)] [2:Large (67-100%)] [3:Large (67-100%)] Exposed Structures: [1:Fascia: No Fat: No] [2:Fascia: No Fat: No] [3:Fascia: No Fat: No] Tendon: No Tendon: No Tendon: No Muscle: No Muscle: No Muscle: No Joint: No Joint: No Joint: No Bone: No Bone: No Bone: No Limited to Skin Limited to Skin Limited to Skin Breakdown Breakdown Breakdown Epithelialization: None None None Periwound Skin Texture: Edema: Yes Edema: Yes Edema: Yes Periwound Skin Moist: Yes Moist: Yes Maceration: Yes Moisture: Moist: Yes Periwound Skin Color: Erythema: Yes Erythema: Yes Erythema: Yes Erythema Location: Circumferential Circumferential Circumferential Temperature: No Abnormality No Abnormality No Abnormality Tenderness on Yes Yes Yes Palpation: Wound Preparation: Ulcer Cleansing: Other: Ulcer Cleansing: Other: Ulcer Cleansing: soap and water soap and water Rinsed/Irrigated with Saline Topical Anesthetic Topical Anesthetic Applied: Other: lidocaine Applied: Other: lidocaine Topical Anesthetic 4% 4% Applied: Other: lidocaine 4% Wound Number: 4 N/A N/A Photos: No Photos N/A N/A Wound Location: Left Gluteal fold - Distal N/A N/A Wounding Event: Pressure Injury N/A N/A Primary Etiology: Pressure Ulcer N/A N/A Comorbid History: Cataracts, Chronic N/A N/A Obstructive Pulmonary Disease (COPD), Sleep Apnea, Angina, Congestive Heart Failure, Hypertension, Type II Diabetes, Gout, Osteoarthritis,  Neuropathy Date Acquired: 12/31/2014 N/A N/A Weeks of Treatment: 0 N/A N/A Wound Status: Open N/A N/A Measurements L x W x D 0.4x0.5x0.1 N/A N/A (cm) Area (cm) : 0.157 N/A N/A Volume (cm) : 0.016 N/A N/A Classification: Category/Stage II N/A N/A HBO Classification: N/A N/A N/A Exudate Amount: Medium N/A N/A Exudate Type: Serosanguineous N/A N/A Exudate Color: red, brown N/A N/A Wound Margin: Flat and Intact N/A N/A Laing, Reedy V. ([id number]) Granulation Amount: Large (67-100%) N/A N/A Granulation Quality: Red, Pink N/A N/A Necrotic Amount: N/A N/A N/A Exposed Structures: Fascia: No N/A N/A Fat: No Tendon: No Muscle: No Joint: No Bone: No Limited to Skin Breakdown Epithelialization: None N/A N/A Periwound Skin Texture: No Abnormalities Noted N/A N/A Periwound Skin Moist: Yes N/A N/A Moisture: Periwound Skin Color: No Abnormalities Noted N/A N/A Erythema Location: N/A N/A N/A Temperature: No Abnormality N/A N/A Tenderness on Yes N/A N/A Palpation: Wound Preparation: Ulcer Cleansing: N/A N/A Rinsed/Irrigated with Saline Topical Anesthetic Applied: Other: lidocaine 4% Treatment Notes Electronic Signature(s) Signed: 01/02/2016 8:19:40 AM By: Alejandro Mulling Entered By: Alejandro Mulling on 01/01/2016 14:09:08 [name] ([id number]) -------------------------------------------------------------------------------- Multi-Disciplinary Care Plan Details Patient Name: [name] Date of Service: 01/01/2016 12:45 PM Medical Record Number: [id number] Patient Account Number: [id number] Date of Birth/Sex: Oct 27, 1946 (70 y.o. Male)  Treating RN: Phillis Haggis Primary Care Physician: Oretha Milch Other Clinician: Referring Physician: Treating Physician/Extender: Rudene Re in Treatment: 0 Active Inactive Electronic Signature(s) Signed: 01/02/2016 8:19:40 AM By: Alejandro Mulling Entered By: Alejandro Mulling on 01/01/2016 14:08:59 [name]  ([id number]) -------------------------------------------------------------------------------- Pain Assessment Details Patient Name: [name] Date of Service: 01/01/2016 12:45 PM Medical Record Number: [id number] Patient Account Number: [id number] Date of Birth/Sex: 03-24-1946 (70 y.o. Male) Treating RN: Phillis Haggis Primary Care Physician: Oretha Milch Other Clinician: Referring Physician: Treating Physician/Extender: Rudene Re in Treatment: 0 Active Problems Location of Pain Severity and Description of Pain Patient Has Paino No Site Locations Pain Management and Medication Current Pain Management: Electronic Signature(s) Signed: 01/02/2016 8:19:40 AM By: Alejandro Mulling Entered By: Alejandro Mulling on 01/01/2016 13:00:00 [name] ([id number]) -------------------------------------------------------------------------------- Patient/Caregiver Education Details Patient Name: [name] Date of Service: 01/01/2016 12:45 PM Medical Record Number: [id number] Patient Account Number: [id number] Date of Birth/Gender: [birth year] (70 y.o. Male) Treating RN: Phillis Haggis Primary Care Physician: Oretha Milch Other Clinician: Referring Physician: Treating Physician/Extender: Rudene Re in Treatment: 0 Education Assessment Education Provided To: Patient Education Topics Provided Wound/Skin Impairment: Handouts: Other: change dressing as ordered Methods: Demonstration, Explain/Verbal Responses: State content correctly Electronic Signature(s) Signed: 01/02/2016 8:19:40 AM By: Alejandro Mulling Entered By: Alejandro Mulling on 01/01/2016 14:29:02 Subia, Trevis Seth Bake ([id number]) -------------------------------------------------------------------------------- Wound Assessment Details Patient Name: [name] Date of Service: 01/01/2016 12:45 PM Medical Record Number: [id number] Patient Account Number: [id number] Date of Birth/Sex: 11-Aug-1946 (70  y.o. Male) Treating RN: Ashok Cordia, Debi Primary Care Physician: Oretha Milch Other Clinician: Referring Physician: Treating Physician/Extender: Rudene Re in Treatment: 0 Wound Status Wound Number: 1 Primary To be determined Etiology: Wound Location: Left Lower Leg Wound Open Wounding Event: Gradually Appeared Status: Date Acquired: 12/31/2014 Comorbid Cataracts, Chronic Obstructive Weeks Of Treatment: 0 History: Pulmonary Disease (COPD), Sleep Clustered Wound: No Apnea, Angina, Congestive Heart Failure, Hypertension, Type II Diabetes, Gout, Osteoarthritis, Neuropathy Photos Photo Uploaded By: Alejandro Mulling on 01/01/2016 17:31:12 Wound Measurements Length: (cm) 12 Width: (cm) 2.6 Depth: (cm) 0.1 Area: (cm) 24.504 Volume: (cm) 2.45 % Reduction in Area: % Reduction in Volume: Epithelialization: None Tunneling: No Undermining: No Wound Description Classification: Partial Thickness Foul Odor A Diabetic Severity (Wagner): Grade 1 Wound Margin: Flat and Intact Exudate Amount: Large Exudate Type: Serosanguineous Exudate Color: red, brown fter Cleansing: No Wound Bed Granulation Amount: Medium (34-66%) Exposed Structure Zirkle, Dominick V. ([id number]) Granulation Quality: Red, Pink Fascia Exposed: No Necrotic Amount: Medium (34-66%) Fat Layer Exposed: No Necrotic Quality: Adherent Slough Tendon Exposed: No Muscle Exposed: No Joint Exposed: No Bone Exposed: No Limited to Skin Breakdown Periwound Skin Texture Texture Color No Abnormalities Noted: No No Abnormalities Noted: No Localized Edema: Yes Erythema: Yes Erythema Location: Circumferential Moisture No Abnormalities Noted: No Temperature / Pain Moist: Yes Temperature: No Abnormality Tenderness on Palpation: Yes Wound Preparation Ulcer Cleansing: Other: soap and water, Topical Anesthetic Applied: Other: lidocaine 4%, Treatment Notes Wound #1 (Left Lower Leg) 1. Cleansed with: Cleanse  wound with antibacterial soap and water 2. Anesthetic Topical Lidocaine 4% cream to wound bed prior to debridement 4. Dressing Applied: Aquacel Ag 5. Secondary Dressing Applied ABD Pad 7. Secured with Health and safety inspector) Signed: 01/02/2016 8:19:40 AM By: Alejandro Mulling Entered By: Alejandro Mulling on 01/01/2016 13:34:04 [name] ([id number]) -------------------------------------------------------------------------------- Wound Assessment Details Patient Name: [name] Date of Service: 01/01/2016 12:45 PM Medical Record Number: [id number] Patient Account Number: [id number] Date  of Birth/Sex: [birth year] (70 y.o. Male) Treating RN: Ashok Cordia, Debi Primary Care Physician: Oretha Milch Other Clinician: Referring Physician: Treating Physician/Extender: Rudene Re in Treatment: 0 Wound Status Wound Number: 2 Primary To be determined Etiology: Wound Location: Right Lower Leg Wound Open Wounding Event: Gradually Appeared Status: Date Acquired: 12/31/2014 Comorbid Cataracts, Chronic Obstructive Weeks Of Treatment: 0 History: Pulmonary Disease (COPD), Sleep Clustered Wound: No Apnea, Angina, Congestive Heart Failure, Hypertension, Type II Diabetes, Gout, Osteoarthritis, Neuropathy Photos Photo Uploaded By: Alejandro Mulling on 01/01/2016 17:31:12 Wound Measurements Length: (cm) 1.5 Width: (cm) 0.2 Depth: (cm) 0.1 Area: (cm) 0.236 Volume: (cm) 0.024 % Reduction in Area: % Reduction in Volume: Epithelialization: None Tunneling: No Undermining: No Wound Description Classification: Partial Thickness Foul Odor A Diabetic Severity (Wagner): Grade 1 Wound Margin: Flat and Intact Exudate Amount: Large Exudate Type: Serosanguineous Exudate Color: red, brown fter Cleansing: No Wound Bed Granulation Amount: Small (1-33%) Exposed Structure Swindler, Jaxin V. ([id number]) Granulation Quality: Red, Pink Fascia Exposed:  No Necrotic Amount: Large (67-100%) Fat Layer Exposed: No Necrotic Quality: Adherent Slough Tendon Exposed: No Muscle Exposed: No Joint Exposed: No Bone Exposed: No Limited to Skin Breakdown Periwound Skin Texture Texture Color No Abnormalities Noted: No No Abnormalities Noted: No Localized Edema: Yes Erythema: Yes Erythema Location: Circumferential Moisture No Abnormalities Noted: No Temperature / Pain Moist: Yes Temperature: No Abnormality Tenderness on Palpation: Yes Wound Preparation Ulcer Cleansing: Other: soap and water, Topical Anesthetic Applied: Other: lidocaine 4%, Treatment Notes Wound #2 (Right Lower Leg) 1. Cleansed with: Cleanse wound with antibacterial soap and water 2. Anesthetic Topical Lidocaine 4% cream to wound bed prior to debridement 4. Dressing Applied: Aquacel Ag 5. Secondary Dressing Applied ABD Pad 7. Secured with Health and safety inspector) Signed: 01/02/2016 8:19:40 AM By: Alejandro Mulling Entered By: Alejandro Mulling on 01/01/2016 13:40:04 [name] ([id number]) -------------------------------------------------------------------------------- Wound Assessment Details Patient Name: [name] Date of Service: 01/01/2016 12:45 PM Medical Record Number: [id number] Patient Account Number: [id number] Date of Birth/Sex: Mar 09, 1946 (70 y.o. Male) Treating RN: Ashok Cordia, Debi Primary Care Physician: Oretha Milch Other Clinician: Referring Physician: Treating Physician/Extender: Rudene Re in Treatment: 0 Wound Status Wound Number: 3 Primary Pressure Ulcer Etiology: Wound Location: Right Calcaneus Wound Open Wounding Event: Pressure Injury Status: Date Acquired: 10/31/2015 Comorbid Cataracts, Chronic Obstructive Weeks Of Treatment: 0 History: Pulmonary Disease (COPD), Sleep Clustered Wound: No Apnea, Angina, Congestive Heart Failure, Hypertension, Type II Diabetes, Gout, Osteoarthritis,  Neuropathy Photos Photo Uploaded By: Alejandro Mulling on 01/01/2016 17:31:28 Wound Measurements Length: (cm) 3 Width: (cm) 3 Depth: (cm) 0.2 Area: (cm) 7.069 Volume: (cm) 1.414 % Reduction in Area: % Reduction in Volume: Epithelialization: None Tunneling: No Undermining: No Wound Description Classification: Category/Stage III Diabetic Severity [address]): Grade 1 Wound Margin: Flat and Intact Exudate Amount: Large Exudate Type: Serosanguineous Exudate Color: red, brown Foul Odor After Cleansing: No Wound Bed Granulation Amount: Small (1-33%) Exposed Structure Stemen, Michaeal V. ([id number]) Granulation Quality: Red, Pink Fascia Exposed: No Necrotic Amount: Large (67-100%) Fat Layer Exposed: No Necrotic Quality: Adherent Slough Tendon Exposed: No Muscle Exposed: No Joint Exposed: No Bone Exposed: No Limited to Skin Breakdown Periwound Skin Texture Texture Color No Abnormalities Noted: No No Abnormalities Noted: No Localized Edema: Yes Erythema: Yes Erythema Location: Circumferential Moisture No Abnormalities Noted: No Temperature / Pain Maceration: Yes Temperature: No Abnormality Moist: Yes Tenderness on Palpation: Yes Wound Preparation Ulcer Cleansing: Rinsed/Irrigated with Saline Topical Anesthetic Applied: Other: lidocaine 4%, Treatment Notes Wound #3 (Right Calcaneus) 1. Cleansed with:  Clean wound with Normal Saline 2. Anesthetic Topical Lidocaine 4% cream to wound bed prior to debridement 3. Peri-wound Care: Barrier cream 4. Dressing Applied: Santyl Ointment 5. Secondary Dressing Applied Bordered Foam Dressing Dry Gauze Electronic Signature(s) Signed: 01/02/2016 8:19:40 AM By: Alejandro Mulling Entered By: Alejandro Mulling on 01/01/2016 13:43:52 Stegmann, Gedalya V. ([id number]) -------------------------------------------------------------------------------- Wound Assessment Details Patient Name: [name] Date of Service: 01/01/2016 12:45  PM Medical Record Number: [id number] Patient Account Number: [id number] Date of Birth/Sex: February 25, 1946 (70 y.o. Male) Treating RN: Ashok Cordia, Debi Primary Care Physician: Oretha Milch Other Clinician: Referring Physician: Treating Physician/Extender: Rudene Re in Treatment: 0 Wound Status Wound Number: 4 Primary Pressure Ulcer Etiology: Wound Location: Left Gluteal fold - Distal Wound Open Wounding Event: Pressure Injury Status: Date Acquired: 12/31/2014 Comorbid Cataracts, Chronic Obstructive Weeks Of Treatment: 0 History: Pulmonary Disease (COPD), Sleep Clustered Wound: No Apnea, Angina, Congestive Heart Failure, Hypertension, Type II Diabetes, Gout, Osteoarthritis, Neuropathy Photos Photo Uploaded By: Alejandro Mulling on 01/01/2016 17:31:28 Wound Measurements Length: (cm) 0.4 Width: (cm) 0.5 Depth: (cm) 0.1 Area: (cm) 0.157 Volume: (cm) 0.016 % Reduction in Area: % Reduction in Volume: Epithelialization: None Tunneling: No Undermining: No Wound Description Classification: Category/Stage II Wound Margin: Flat and Intact Exudate Amount: Medium Exudate Type: Serosanguineous Exudate Color: red, brown Foul Odor After Cleansing: No Wound Bed Granulation Amount: Large (67-100%) Exposed Structure Granulation Quality: Red, Pink Fascia Exposed: No Yarbough, Shoji V. ([id number]) Fat Layer Exposed: No Tendon Exposed: No Muscle Exposed: No Joint Exposed: No Bone Exposed: No Limited to Skin Breakdown Periwound Skin Texture Texture Color No Abnormalities Noted: No No Abnormalities Noted: No Moisture Temperature / Pain No Abnormalities Noted: No Temperature: No Abnormality Moist: Yes Tenderness on Palpation: Yes Wound Preparation Ulcer Cleansing: Rinsed/Irrigated with Saline Topical Anesthetic Applied: Other: lidocaine 4%, Treatment Notes Wound #4 (Left, Distal Gluteal fold) 1. Cleansed with: Clean wound with Normal Saline 2. Anesthetic Topical  Lidocaine 4% cream to wound bed prior to debridement 3. Peri-wound Care: Skin Prep 4. Dressing Applied: Aquacel Ag 5. Secondary Dressing Applied Bordered Foam Dressing Electronic Signature(s) Signed: 01/02/2016 8:19:40 AM By: Alejandro Mulling Entered By: Alejandro Mulling on 01/01/2016 13:50:30 [name] ([id number]) -------------------------------------------------------------------------------- Vitals Details Patient Name: [name] Date of Service: 01/01/2016 12:45 PM Medical Record Number: [id number] Patient Account Number: [id number] Date of Birth/Sex: 1946/05/02 (70 y.o. Male) Treating RN: Ashok Cordia, Debi Primary Care Physician: Oretha Milch Other Clinician: Referring Physician: Treating Physician/Extender: Rudene Re in Treatment: 0 Vital Signs Time Taken: 13:00 Temperature (F): 98.0 Height (in): 70 Pulse (bpm): 96 Source: Stated Respiratory Rate (breaths/min): 20 Weight (lbs): 235 Blood Pressure (mmHg): 147/92 Source: Stated Reference Range: 80 - 120 mg / dl Body Mass Index (BMI): 33.7 Electronic Signature(s) Signed: 01/02/2016 8:19:40 AM By: Alejandro Mulling Entered By: Alejandro Mulling on 01/01/2016 13:00:44

## 2016-01-02 NOTE — Progress Notes (Signed)
SALVATOR, SEPPALA ([id number]) Visit Report for 01/01/2016 Chief Complaint Document Details Patient Name: [name], [name] Date of Service: 01/01/2016 12:45 PM Medical Record Number: [id number] Patient Account Number: [id number] Date of Birth/Sex: May 02, 1946 (70 y.o. Male) Treating RN: Phillis Haggis Primary Care Physician: Oretha Milch Other Clinician: Referring Physician: Treating Physician/Extender: Rudene Re in Treatment: 0 Information Obtained from: Patient Chief Complaint Patient is at the clinic for treatment of an open pressure ulcer the left upper thigh and gluteal region and the right heel with bilateral swelling of the legs all of it which is going on for over a year Electronic Signature(s) Signed: 01/01/2016 2:35:50 PM By: Evlyn Kanner MD, FACS Previous Signature: 01/01/2016 2:26:03 PM Version By: Evlyn Kanner MD, FACS Entered By: Evlyn Kanner on 01/01/2016 14:35:50 [name] ([id number]) -------------------------------------------------------------------------------- HPI Details Patient Name: [name] Date of Service: 01/01/2016 12:45 PM Medical Record Number: [id number] Patient Account Number: [id number] Date of Birth/Sex: October 15, 1946 (70 y.o. Male) Treating RN: Phillis Haggis Primary Care Physician: Oretha Milch Other Clinician: Referring Physician: Treating Physician/Extender: Rudene Re in Treatment: 0 History of Present Illness Location: ulcerated area on the right heel, left gluteal region and thigh and then bilateral lower extremities Quality: Patient reports experiencing a sharp pain to affected area(s). Severity: Patient states wound are getting worse. Duration: Patient has had the wound for > 12 months prior to seeking treatment at the wound center Timing: Pain in wound is constant (hurts all the time) Context: The wound appeared gradually over time Modifying Factors: Other treatment(s) tried include: he sees his heart doctor  and his primary care doctor Associated Signs and Symptoms: patient has not been able to walk for over a year now HPI Description: 70 year old gentleman with a known history of hypertension, diabetes, obstructive sleep apnea, COPD, diastolic CHF, coronary artery disease was admitted to the hospital with sepsis from an ulcer of the right heel and was treated there in October 2016. he also has chronic bilateral lower extremity edema and lymphedema. He had received vancomycin, Zosyn and at that stage and x-rays showed hardware in the right ankle but no evidence of osteomyelitis. he was a former smoker. he was also treated with Augmentin orally for 10 days. is either bedbound or wheelchair-bound and does not ambulate by himself. Electronic Signature(s) Signed: 01/01/2016 2:37:27 PM By: Evlyn Kanner MD, FACS Previous Signature: 01/01/2016 1:59:05 PM Version By: Evlyn Kanner MD, FACS Entered By: Evlyn Kanner on 01/01/2016 14:37:26 [name] ([id number]) -------------------------------------------------------------------------------- Physical Exam Details Patient Name: [name] Date of Service: 01/01/2016 12:45 PM Medical Record Number: [id number] Patient Account Number: [id number] Date of Birth/Sex: 1946-10-29 (70 y.o. Male) Treating RN: Phillis Haggis Primary Care Physician: Oretha Milch Other Clinician: Referring Physician: Treating Physician/Extender: Rudene Re in Treatment: 0 Constitutional . Pulse regular. Respirations normal and unlabored. Afebrile. . Eyes Nonicteric. Reactive to light. Ears, Nose, Mouth, and Throat Lips, teeth, and gums WNL.Marland Kitchen Moist mucosa without lesions. Neck supple and nontender. No palpable supraclavicular or cervical adenopathy. Normal sized without goiter. Respiratory WNL. No retractions.. Cardiovascular Pedal Pulses WNL. ABIs are noncompressible bilaterally. he has bilateral stage II lymphedema. Gastrointestinal (GI) has a large  incisional hernia in the periumbilical region and some mucus has previously been removed. No liver or spleen enlargement or tenderness.. Musculoskeletal Adexa without tenderness or enlargement.. Digits and nails w/o clubbing, cyanosis, infection, petechiae, ischemia, or inflammatory conditions.. Integumentary (Hair, Skin) No suspicious lesions. No crepitus or fluctuance. No peri-wound warmth or erythema. No masses.Marland Kitchen Psychiatric  Judgement and insight Intact.. No evidence of depression, anxiety, or agitation.. Notes Examination of his right heel reveals a stage III pressure ulcer with significant amount of slough and this is extremely tender to touch. He has got a stage II pressure injury in the region of his left upper thigh and gluteal region. bilateral lower extremity lymphedema with micro-ulcerations and a few superficial ulcerations on both lower extremities Electronic Signature(s) Signed: 01/01/2016 2:39:25 PM By: Evlyn Kanner MD, FACS Entered By: Evlyn Kanner on 01/01/2016 14:39:24 [name] ([id number]) -------------------------------------------------------------------------------- Physician Orders Details Patient Name: [name] Date of Service: 01/01/2016 12:45 PM Medical Record Number: [id number] Patient Account Number: [id number] Date of Birth/Sex: [birth year] (70 y.o. Male) Treating RN: Phillis Haggis Primary Care Physician: Oretha Milch Other Clinician: Referring Physician: Treating Physician/Extender: Rudene Re in Treatment: 0 Verbal / Phone Orders: Yes Clinician: Ashok Cordia, Debi Read Back and Verified: Yes Diagnosis Coding Wound Cleansing Wound #1 Left Lower Leg o Cleanse wound with mild soap and water o May Shower, gently pat wound dry prior to applying new dressing. Wound #2 Right Lower Leg o Cleanse wound with mild soap and water o May Shower, gently pat wound dry prior to applying new dressing. Wound #3 Right Calcaneus o  Cleanse wound with mild soap and water o May Shower, gently pat wound dry prior to applying new dressing. Wound #4 Left,Distal Gluteal fold o Cleanse wound with mild soap and water o May Shower, gently pat wound dry prior to applying new dressing. Anesthetic Wound #1 Left Lower Leg o Topical Lidocaine 4% cream applied to wound bed prior to debridement Wound #2 Right Lower Leg o Topical Lidocaine 4% cream applied to wound bed prior to debridement Wound #3 Right Calcaneus o Topical Lidocaine 4% cream applied to wound bed prior to debridement Wound #4 Left,Distal Gluteal fold o Topical Lidocaine 4% cream applied to wound bed prior to debridement Skin Barriers/Peri-Wound Care Wound #3 Right Calcaneus o Skin Prep Wound #4 Left,Distal Gluteal fold o Skin Prep Goswick, Brighten V. ([id number]) Primary Wound Dressing Wound #1 Left Lower Leg o Aquacel Ag Wound #2 Right Lower Leg o Aquacel Ag Wound #3 Right Calcaneus o Santyl Ointment Wound #4 Left,Distal Gluteal fold o Aquacel Ag Secondary Dressing Wound #1 Left Lower Leg o ABD pad Wound #2 Right Lower Leg o ABD pad Wound #3 Right Calcaneus o Gauze, ABD and Kerlix/Conform o Foam Wound #4 Left,Distal Gluteal fold o Dry Gauze o Boardered Foam Dressing Dressing Change Frequency Wound #1 Left Lower Leg o Change dressing every week Wound #2 Right Lower Leg o Change dressing every week Wound #3 Right Calcaneus o Change dressing every day. Wound #4 Left,Distal Gluteal fold o Change dressing every other day. Follow-up Appointments Wound #1 Left Lower Leg o Return Appointment in 1 week. Wound #2 Right Lower Leg o Return Appointment in 1 week. SABIEN, UMLAND VMarland Kitchen ([id number]) Wound #3 Right Calcaneus o Return Appointment in 1 week. Wound #4 Left,Distal Gluteal fold o Return Appointment in 1 week. Edema Control Wound #1 Left Lower Leg o Unna Boots Bilaterally o Elevate legs  to the level of the heart and pump ankles as often as possible Wound #2 Right Lower Leg o Unna Boots Bilaterally o Elevate legs to the level of the heart and pump ankles as often as possible Wound #3 Right Calcaneus o Unna Boots Bilaterally o Elevate legs to the level of the heart and pump ankles as often as possible Wound #4 Left,Distal Gluteal fold o Unna  Boots Bilaterally o Elevate legs to the level of the heart and pump ankles as often as possible Off-Loading Wound #1 Left Lower Leg o Turn and reposition every 2 hours o Other: - sage boots Wound #2 Right Lower Leg o Turn and reposition every 2 hours o Other: - sage boots Wound #3 Right Calcaneus o Turn and reposition every 2 hours o Other: - sage boots Wound #4 Left,Distal Gluteal fold o Turn and reposition every 2 hours o Other: - sage boots Radiology o X-ray, heel - right heel Services and Therapies o Arterial Studies- Bilateral - [facility] ODYN, TURKO ([id number]) Electronic Signature(s) Signed: 01/01/2016 2:58:24 PM By: Evlyn Kanner MD, FACS Signed: 01/02/2016 8:19:40 AM By: Alejandro Mulling Entered By: Alejandro Mulling on 01/01/2016 14:40:32 Heimsoth, Jayshaun V. ([id number]) -------------------------------------------------------------------------------- Problem List Details Patient Name: [name] Date of Service: 01/01/2016 12:45 PM Medical Record Number: [id number] Patient Account Number: [id number] Date of Birth/Sex: 01-23-1946 (70 y.o. Male) Treating RN: Phillis Haggis Primary Care Physician: Oretha Milch Other Clinician: Referring Physician: Treating Physician/Extender: Rudene Re in Treatment: 0 Active Problems ICD-10 Encounter Code Description Active Date Diagnosis E11.621 Type 2 diabetes mellitus with foot ulcer 01/01/2016 Yes L89.613 Pressure ulcer of right heel, stage 3 01/01/2016 Yes L89.322 Pressure ulcer of left buttock, stage 2  01/01/2016 Yes E66.01 Morbid (severe) obesity due to excess calories 01/01/2016 Yes I89.0 Lymphedema, not elsewhere classified 01/01/2016 Yes Inactive Problems Resolved Problems Electronic Signature(s) Signed: 01/01/2016 2:35:33 PM By: Evlyn Kanner MD, FACS Previous Signature: 01/01/2016 2:26:33 PM Version By: Evlyn Kanner MD, FACS Previous Signature: 01/01/2016 2:24:55 PM Version By: Evlyn Kanner MD, FACS Entered By: Evlyn Kanner on 01/01/2016 14:35:33 [name] ([id number]) -------------------------------------------------------------------------------- Progress Note Details Patient Name: [name] Date of Service: 01/01/2016 12:45 PM Medical Record Number: [id number] Patient Account Number: [id number] Date of Birth/Sex: [birth year] (70 y.o. Male) Treating RN: Ashok Cordia, Debi Primary Care Physician: Oretha Milch Other Clinician: Referring Physician: Treating Physician/Extender: Rudene Re in Treatment: 0 Subjective Chief Complaint Information obtained from Patient Patient is at the clinic for treatment of an open pressure ulcer the left upper thigh and gluteal region and the right heel with bilateral swelling of the legs all of it which is going on for over a year History of Present Illness (HPI) The following HPI elements were documented for the patient's wound: Location: ulcerated area on the right heel, left gluteal region and thigh and then bilateral lower extremities Quality: Patient reports experiencing a sharp pain to affected area(s). Severity: Patient states wound are getting worse. Duration: Patient has had the wound for > 12 months prior to seeking treatment at the wound center Timing: Pain in wound is constant (hurts all the time) Context: The wound appeared gradually over time Modifying Factors: Other treatment(s) tried include: he sees his heart doctor and his primary care doctor Associated Signs and Symptoms: patient has not been able to walk for  over a year now 70 year old gentleman with a known history of hypertension, diabetes, obstructive sleep apnea, COPD, diastolic CHF, coronary artery disease was admitted to the hospital with sepsis from an ulcer of the right heel and was treated there in October 2016. he also has chronic bilateral lower extremity edema and lymphedema. He had received vancomycin, Zosyn and at that stage and x-rays showed hardware in the right ankle but no evidence of osteomyelitis. he was a former smoker. he was also treated with Augmentin orally for 10 days. is either bedbound or wheelchair-bound  and does not ambulate by himself. Wound History Patient reportedly has not tested positive for osteomyelitis. Patient reportedly has not had testing performed to evaluate circulation in the legs. Patient experiences the following problems associated with their wounds: swelling. Patient History Information obtained from Patient. Allergies No allergies have been documented for the patient JENNIE, BOLAR ([id number]) Family History Cancer - Siblings, Heart Disease - Father, No family history of Diabetes, Hereditary Spherocytosis, Hypertension, Kidney Disease, Lung Disease, Seizures, Tuberculosis. Social History Former smoker - smoked for 20 years, been quit 20 years, Alcohol Use - Never, Drug Use - No History, Caffeine Use - Daily. Medical History Eyes Patient has history of Cataracts - hx Respiratory Patient has history of Chronic Obstructive Pulmonary Disease (COPD), Sleep Apnea Cardiovascular Patient has history of Angina, Congestive Heart Failure, Hypertension Endocrine Patient has history of Type II Diabetes Musculoskeletal Patient has history of Gout, Osteoarthritis Neurologic Patient has history of Neuropathy Patient is treated with Insulin. Blood sugar is tested. Medical And Surgical History Notes Respiratory chronic respiratory failure Cardiovascular atherosclerotic heart disease  hyperlipidemia Gastrointestinal GERD Endocrine hypomagmesmia Genitourinary CKD Lymphedema Musculoskeletal morbid obesity difficulty walking Psychiatric Major depressive disorder Review of Systems (ROS) Constitutional Symptoms (General Health) The patient has no complaints or symptoms. Eyes Complains or has symptoms of Glasses / Contacts - glasses. Ear/Nose/Mouth/Throat The patient has no complaints or symptoms. Hematologic/Lymphatic The patient has no complaints or symptoms. Respiratory Bassette, Tanmay V. ([id number]) Complains or has symptoms of Shortness of Breath. Genitourinary Complains or has symptoms of Incontinence/dribbling. Immunological The patient has no complaints or symptoms. Integumentary (Skin) Complains or has symptoms of Wounds. Musculoskeletal Complains or has symptoms of Muscle Weakness. Oncologic The patient has no complaints or symptoms. Psychiatric Complains or has symptoms of Anxiety. Medications Flagyl 500 mg tablet oral 1 1 tablet oral crush and apply to heel daily acetaminophen 500 mg tablet oral 2 2 tablet oral every six hours as needed oxycodone 10 mg tablet oral 1 1 tablet oral three times daily oxycodone 10 mg tablet oral 2 2 tablet oral every six hours as needed divalproex 250 mg tablet,delayed release oral 1 1 tablet,delayed release (DR/EC) oral two times daily gabapentin 400 mg capsule oral 1 1 capsule oral three times daily fluconazole 150 mg tablet oral 1 1 tablet oral daily loratadine 10 mg tablet oral 1 1 tablet oral daily Delsym 12 hour 30 mg/5 mL oral suspension,extended release oral 60 60 mg. suspension,extended rel 12 hr oral finasteride 5 mg tablet oral 1 1 tablet oral daily tamsulosin 0.4 mg capsule oral 2 2 capsules (0.8 mg.) extended release 24hr oral daily ipratropium-albuterol 0.5 mg-3 mg(2.5 mg base)/3 mL nebulization soln inhalation 1 1 solution for nebulization inhalation two times daily metoprolol tartrate 50 mg tablet  oral 1 1 tablet oral daily guaifenesin ER 600 mg tablet, extended release 12 hr oral 1 1 tablet extended release 12hr oral two times daily allopurinol 100 mg tablet oral 1 1 tablet oral nightly Novolin 70/30 100 unit/mL subcutaneous suspension subcutaneous 25 units suspension subcutaneous nightly and 15 units in the morning Biofreeze (menthol) 4 % topical gel topical gel topical application every 12 hours Milk of Magnesia 400 mg/5 mL oral suspension oral 30 30 mL suspension oral daily furosemide 20 mg tablet oral 3 3 tablet (60 mg.) oral two times daily Multiple Vitamin-Minerals tablet oral 1 1 tablet oral daily omeprazole 20 mg capsule,delayed release oral 1 1 capsule,delayed release(DR/EC) oral daily sertraline 100 mg tablet oral 1 1 tablet oral daily trazodone  50 mg tablet oral 1 1 tablet oral nightly nystatin 100,000 unit/gram topical powder topical powder topical apply to groin three times daily Lutterman, Magdiel V. ([id number]) Objective Constitutional Pulse regular. Respirations normal and unlabored. Afebrile. Vitals Time Taken: 1:00 PM, Height: 70 in, Source: Stated, Weight: 235 lbs, Source: Stated, BMI: 33.7, Temperature: 98.0 F, Pulse: 96 bpm, Respiratory Rate: 20 breaths/min, Blood Pressure: 147/92 mmHg. Eyes Nonicteric. Reactive to light. Ears, Nose, Mouth, and Throat Lips, teeth, and gums WNL.Marland Kitchen Moist mucosa without lesions. Neck supple and nontender. No palpable supraclavicular or cervical adenopathy. Normal sized without goiter. Respiratory WNL. No retractions.. Cardiovascular Pedal Pulses WNL. ABIs are noncompressible bilaterally. he has bilateral stage II lymphedema. Gastrointestinal (GI) has a large incisional hernia in the periumbilical region and some mucus has previously been removed. No liver or spleen enlargement or tenderness.. Musculoskeletal Adexa without tenderness or enlargement.. Digits and nails w/o clubbing, cyanosis, infection, petechiae, ischemia, or  inflammatory conditions.Marland Kitchen Psychiatric Judgement and insight Intact.. No evidence of depression, anxiety, or agitation.. General Notes: Examination of his right heel reveals a stage III pressure ulcer with significant amount of slough and this is extremely tender to touch. He has got a stage II pressure injury in the region of his left upper thigh and gluteal region. bilateral lower extremity lymphedema with micro-ulcerations and a few superficial ulcerations on both lower extremities Integumentary (Hair, Skin) No suspicious lesions. No crepitus or fluctuance. No peri-wound warmth or erythema. No masses.. Wound #1 status is Open. Original cause of wound was Gradually Appeared. The wound is located on the Left Lower Leg. The wound measures 12cm length x 2.6cm width x 0.1cm depth; 24.504cm^2 area and 2.45cm^3 volume. The wound is limited to skin breakdown. There is no tunneling or undermining noted. There is a large amount of serosanguineous drainage noted. The wound margin is flat and intact. There is medium (34-66%) red, pink granulation within the wound bed. There is a medium (34-66%) amount of Headings, Jeremiyah V. ([id number]) necrotic tissue within the wound bed including Adherent Slough. The periwound skin appearance exhibited: Localized Edema, Moist, Erythema. The surrounding wound skin color is noted with erythema which is circumferential. Periwound temperature was noted as No Abnormality. The periwound has tenderness on palpation. Wound #2 status is Open. Original cause of wound was Gradually Appeared. The wound is located on the Right Lower Leg. The wound measures 1.5cm length x 0.2cm width x 0.1cm depth; 0.236cm^2 area and 0.024cm^3 volume. The wound is limited to skin breakdown. There is no tunneling or undermining noted. There is a large amount of serosanguineous drainage noted. The wound margin is flat and intact. There is small (1-33%) red, pink granulation within the wound bed. There  is a large (67-100%) amount of necrotic tissue within the wound bed including Adherent Slough. The periwound skin appearance exhibited: Localized Edema, Moist, Erythema. The surrounding wound skin color is noted with erythema which is circumferential. Periwound temperature was noted as No Abnormality. The periwound has tenderness on palpation. Wound #3 status is Open. Original cause of wound was Pressure Injury. The wound is located on the Right Calcaneus. The wound measures 3cm length x 3cm width x 0.2cm depth; 7.069cm^2 area and 1.414cm^3 volume. The wound is limited to skin breakdown. There is no tunneling or undermining noted. There is a large amount of serosanguineous drainage noted. The wound margin is flat and intact. There is small (1-33%) red, pink granulation within the wound bed. There is a large (67-100%) amount of necrotic tissue within the wound  bed including Adherent Slough. The periwound skin appearance exhibited: Localized Edema, Maceration, Moist, Erythema. The surrounding wound skin color is noted with erythema which is circumferential. Periwound temperature was noted as No Abnormality. The periwound has tenderness on palpation. Wound #4 status is Open. Original cause of wound was Pressure Injury. The wound is located on the Left,Distal Gluteal fold. The wound measures 0.4cm length x 0.5cm width x 0.1cm depth; 0.157cm^2 area and 0.016cm^3 volume. The wound is limited to skin breakdown. There is no tunneling or undermining noted. There is a medium amount of serosanguineous drainage noted. The wound margin is flat and intact. There is large (67-100%) red, pink granulation within the wound bed. The periwound skin appearance exhibited: Moist. Periwound temperature was noted as No Abnormality. The periwound has tenderness on palpation. Assessment Active Problems ICD-10 E11.621 - Type 2 diabetes mellitus with foot ulcer L89.613 - Pressure ulcer of right heel, stage 3 L89.322 -  Pressure ulcer of left buttock, stage 2 E66.01 - Morbid (severe) obesity due to excess calories I89.0 - Lymphedema, not elsewhere classified KALAB, CAMPS ([id number]) 70 year old diabetic who is also morbidly obese and bedbound has 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 Also due to chronic lymphedema he has bilateral excoriation and ulceration of both lower extremities. I have recommended: 1. Santyl ointment locally to his right heel. the areas to tender to debride and once we know his arterial studies he may need a debridement under anesthesia 2. Aquacel Ag to his left upper thigh and gluteal area 3. Constant off loading and also using Sage boot. 4. x-ray of the right heel 5. Bilateral arterial duplex studies of both lower extremities. 6. bilateral lower extremity Unnas boots which she has been using before 7. High-protein diet and multivitamins including vitamin C and zinc. 8. Check with his skilled nursing facility the type of mattress he is using and make appropriate changes as required. 9. See has regularly had the wound center Plan Wound Cleansing: Wound #1 Left Lower Leg: Cleanse wound with mild soap and water May Shower, gently pat wound dry prior to applying new dressing. Wound #2 Right Lower Leg: Cleanse wound with mild soap and water May Shower, gently pat wound dry prior to applying new dressing. Wound #3 Right Calcaneus: Cleanse wound with mild soap and water May Shower, gently pat wound dry prior to applying new dressing. Wound #4 Left,Distal Gluteal fold: Cleanse wound with mild soap and water May Shower, gently pat wound dry prior to applying new dressing. Anesthetic: Wound #1 Left Lower Leg: Topical Lidocaine 4% cream applied to wound bed prior to debridement Wound #2 Right Lower Leg: Topical Lidocaine 4% cream applied to wound bed prior to debridement Wound #3 Right Calcaneus: Topical Lidocaine 4% cream applied  to wound bed prior to debridement Wound #4 Left,Distal Gluteal fold: Topical Lidocaine 4% cream applied to wound bed prior to debridement Skin Barriers/Peri-Wound Care: Wound #3 Right Calcaneus: Skin Prep Wound #4 Left,Distal Gluteal fold: Skin Prep Cu, Corgan V. ([id number]) Primary Wound Dressing: Wound #1 Left Lower Leg: Aquacel Ag Wound #2 Right Lower Leg: Aquacel Ag Wound #3 Right Calcaneus: Santyl Ointment Wound #4 Left,Distal Gluteal fold: Aquacel Ag Secondary Dressing: Wound #1 Left Lower Leg: ABD pad Wound #2 Right Lower Leg: ABD pad Wound #3 Right Calcaneus: ABD pad Dry Gauze Foam Wound #4 Left,Distal Gluteal fold: Dry Gauze Boardered Foam Dressing Dressing Change Frequency: Wound #1 Left Lower Leg: Change dressing every week Wound #  2 Right Lower Leg: Change dressing every week Wound #3 Right Calcaneus: Change dressing every day. Wound #4 Left,Distal Gluteal fold: Change dressing every other day. Follow-up Appointments: Wound #1 Left Lower Leg: Return Appointment in 1 week. Wound #2 Right Lower Leg: Return Appointment in 1 week. Wound #3 Right Calcaneus: Return Appointment in 1 week. Wound #4 Left,Distal Gluteal fold: Return Appointment in 1 week. Edema Control: Wound #1 Left Lower Leg: Unna Boots Bilaterally Elevate legs to the level of the heart and pump ankles as often as possible Wound #2 Right Lower Leg: Unna Boots Bilaterally Elevate legs to the level of the heart and pump ankles as often as possible Wound #3 Right Calcaneus: Unna Boots Bilaterally Elevate legs to the level of the heart and pump ankles as often as possible Wound #4 Left,Distal Gluteal fold: [employer] Bilaterally Kot, Antonis V. ([id number]) Elevate legs to the level of the heart and pump ankles as often as possible Off-Loading: Wound #1 Left Lower Leg: Turn and reposition every 2 hours Other: - sage boots Wound #2 Right Lower Leg: Turn and reposition every 2  hours Other: - sage boots Wound #3 Right Calcaneus: Turn and reposition every 2 hours Other: - sage boots Wound #4 Left,Distal Gluteal fold: Turn and reposition every 2 hours Other: - sage boots Radiology ordered were: X-ray, heel - right heel Services and Therapies ordered were: Arterial Studies- Bilateral - [facility] 70 year old diabetic who is also morbidly obese and bedbound has 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 Also due to chronic lymphedema he has bilateral excoriation and ulceration of both lower extremities. I have recommended: 1. Santyl ointment locally to his right heel. the areas to tender to debride and once we know his arterial studies he may need a debridement under anesthesia 2. Aquacel Ag to his left upper thigh and gluteal area 3. Constant off loading and also using Sage boot. 4. x-ray of the right heel 5. Bilateral arterial duplex studies of both lower extremities 6. bilateral lower extremity Unnas boots which she has been using before 7. High-protein diet and multivitamins including vitamin C and zinc. 8. Check with his skilled nursing facility the type of mattress he is using and make appropriate changes as required. 9. See has regularly had the wound center Electronic Signature(s) Signed: 01/01/2016 2:44:12 PM By: Evlyn Kanner MD, FACS Previous Signature: 01/01/2016 2:42:17 PM Version By: Evlyn Kanner MD, FACS Entered By: Evlyn Kanner on 01/01/2016 14:44:11 [name] ([id number]) -------------------------------------------------------------------------------- ROS/PFSH Details Patient Name: [name] Date of Service: 01/01/2016 12:45 PM Medical Record Number: [id number] Patient Account Number: [id number] Date of Birth/Sex: 1946/03/30 (70 y.o. Male) Treating RN: Ashok Cordia, Debi Primary Care Physician: Oretha Milch Other Clinician: Referring Physician: Treating Physician/Extender:  Rudene Re in Treatment: 0 Information Obtained From Patient Wound History Do you currently have one or more open woundso Yes Approximately how long have you had your woundso 8 months How have you been treating your wound(s) until nowo unna boots Has your wound(s) ever healed and then re-openedo No Have you had any lab work done in the past montho No Have you tested positive for an antibiotic resistant organism (MRSA, VRE)o No Have you tested positive for osteomyelitis (bone infection)o No Have you had any tests for circulation on your legso No Have you had other problems associated with your woundso Swelling Eyes Complaints and Symptoms: Positive for: Glasses / Contacts - glasses Medical History: Positive  for: Cataracts - hx Respiratory Complaints and Symptoms: Positive for: Shortness of Breath Medical History: Positive for: Chronic Obstructive Pulmonary Disease (COPD); Sleep Apnea Past Medical History Notes: chronic respiratory failure Genitourinary Complaints and Symptoms: Positive for: Incontinence/dribbling Medical History: Past Medical History Notes: CKD Lymphedema Integumentary (Skin) Griffie, Johnattan V. ([id number]) Complaints and Symptoms: Positive for: Wounds Musculoskeletal Complaints and Symptoms: Positive for: Muscle Weakness Medical History: Positive for: Gout; Osteoarthritis Past Medical History Notes: morbid obesity difficulty walking Psychiatric Complaints and Symptoms: Positive for: Anxiety Medical History: Past Medical History Notes: Major depressive disorder Constitutional Symptoms (General Health) Complaints and Symptoms: No Complaints or Symptoms Ear/Nose/Mouth/Throat Complaints and Symptoms: No Complaints or Symptoms Hematologic/Lymphatic Complaints and Symptoms: No Complaints or Symptoms Cardiovascular Medical History: Positive for: Angina; Congestive Heart Failure; Hypertension Past Medical History Notes: atherosclerotic  heart disease hyperlipidemia Gastrointestinal Medical History: Past Medical History Notes: GERD Salais, Alecxander V. ([id number]) Endocrine Medical History: Positive for: Type II Diabetes Past Medical History Notes: hypomagmesmia Time with diabetes: 30 years Treated with: Insulin Blood sugar tested every day: Yes Tested : Immunological Complaints and Symptoms: No Complaints or Symptoms Neurologic Medical History: Positive for: Neuropathy Oncologic Complaints and Symptoms: No Complaints or Symptoms HBO Extended History Items Eyes: Cataracts Family and Social History Cancer: Yes - Siblings; Diabetes: No; Heart Disease: Yes - Father; Hereditary Spherocytosis: No; Hypertension: No; Kidney Disease: No; Lung Disease: No; Seizures: No; Tuberculosis: No; Former smoker - smoked for 20 years, been quit 20 years; Alcohol Use: Never; Drug Use: No History; Caffeine Use: Daily; Financial Concerns: No; Food, Clothing or Shelter Needs: No; Support System Lacking: No; Transportation Concerns: No; Advanced Directives: No; Patient does not want information on Advanced Directives; Do not resuscitate: No; Living Will: Yes (Copy provided); Medical Power of Attorney: Yes - Campbell Lerner (Not Provided) Physician Affirmation I have reviewed and agree with the above information. Electronic Signature(s) Signed: 01/01/2016 1:48:49 PM By: Evlyn Kanner MD, FACS Signed: 01/02/2016 8:19:40 AM By: Alejandro Mulling Entered By: Evlyn Kanner on 01/01/2016 13:48:49 [name] ([id number]) -------------------------------------------------------------------------------- SuperBill Details Patient Name: [name] Date of Service: 01/01/2016 Medical Record Number: [id number] Patient Account Number: [id number] Date of Birth/Sex: 1946/11/06 (70 y.o. Male) Treating RN: Ashok Cordia, Debi Primary Care Physician: Oretha Milch Other Clinician: Referring Physician: Treating Physician/Extender: Rudene Re in Treatment: 0 Diagnosis Coding ICD-10 Codes Code Description E11.621 Type 2 diabetes mellitus with foot ulcer L89.613 Pressure ulcer of right heel, stage 3 L89.322 Pressure ulcer of left buttock, stage 2 E66.01 Morbid (severe) obesity due to excess calories I89.0 Lymphedema, not elsewhere classified Facility Procedures CPT4 Code: 62952841 Description: [phone] - WOUND CARE VISIT-LEV 5 EST PT Modifier: Quantity: 1 Physician Procedures CPT4 Code: [id number] Description: 99204 - WC PHYS LEVEL 4 - NEW PT ICD-10 Description Diagnosis E11.621 Type 2 diabetes mellitus with foot ulcer L89.613 Pressure ulcer of right heel, stage 3 L89.322 Pressure ulcer of left buttock, stage 2 I89.0 Lymphedema, not elsewhere  classified Modifier: Quantity: 1 Electronic Signature(s) Unsigned Previous Signature: 01/01/2016 2:42:37 PM Version By: Evlyn Kanner MD, FACS Entered By: Alejandro Mulling on 01/02/2016 12:58:24 Signature(s): Date(s):

## 2016-01-02 NOTE — Progress Notes (Signed)
JOHNTHOMAS, LADER ([id number]) Visit Report for 01/01/2016 Abuse/Suicide Risk Screen Details Patient Name: BASHIR, MARCHETTI. Date of Service: 01/01/2016 12:45 PM Medical Record Number: [id number] Patient Account Number: [id number] Date of Birth/Sex: [birth year] (70 y.o. Male) Treating RN: Phillis Haggis Primary Care Physician: Oretha Milch Other Clinician: Referring Physician: Treating Physician/Extender: Rudene Re in Treatment: 0 Abuse/Suicide Risk Screen Items Answer ABUSE/SUICIDE RISK SCREEN: Has anyone close to you tried to hurt or harm you recentlyo No Do you feel uncomfortable with anyone in your familyo No Has anyone forced you do things that you didnot want to doo No Do you have any thoughts of harming yourselfo No Patient displays signs or symptoms of abuse and/or neglect. No Electronic Signature(s) Signed: 01/02/2016 8:19:40 AM By: Alejandro Mulling Entered By: Alejandro Mulling on 01/01/2016 13:14:33 [name] ([id number]) -------------------------------------------------------------------------------- Activities of Daily Living Details Patient Name: [name]. Date of Service: 01/01/2016 12:45 PM Medical Record Number: [id number] Patient Account Number: [id number] Date of Birth/Sex: Oct 22, 1946 (70 y.o. Male) Treating RN: Phillis Haggis Primary Care Physician: Oretha Milch Other Clinician: Referring Physician: Treating Physician/Extender: Rudene Re in Treatment: 0 Activities of Daily Living Items Answer Activities of Daily Living (Please select one for each item) Drive Automobile Not Able Take Medications Not Able Use Telephone Completely Able Care for Appearance Need Assistance Use Toilet Need Assistance Bath / Shower Need Assistance Dress Self Need Assistance Feed Self Completely Able Walk Not Able Get In / Out Bed Need Assistance Housework Not Able Prepare Meals Not Able Handle Money Not Able Shop for Self Not Able Electronic  Signature(s) Signed: 01/02/2016 8:19:40 AM By: Alejandro Mulling Entered By: Alejandro Mulling on 01/01/2016 13:15:25 [name] ([id number]) -------------------------------------------------------------------------------- Education Assessment Details Patient Name: [name]. Date of Service: 01/01/2016 12:45 PM Medical Record Number: [id number] Patient Account Number: [id number] Date of Birth/Sex: June 20, 1946 (70 y.o. Male) Treating RN: Phillis Haggis Primary Care Physician: Oretha Milch Other Clinician: Referring Physician: Treating Physician/Extender: Rudene Re in Treatment: 0 Primary Learner Assessed: Patient Learning Preferences/Education Level/Primary Language Learning Preference: Explanation, Printed Material Highest Education Level: Grade School Preferred Language: English Cognitive Barrier Assessment/Beliefs Language Barrier: No Translator Needed: No Memory Deficit: No Emotional Barrier: No Cultural/Religious Beliefs Affecting Medical No Care: Physical Barrier Assessment Impaired Vision: Yes Glasses Impaired Hearing: No Decreased Hand dexterity: No Knowledge/Comprehension Assessment Knowledge Level: High Comprehension Level: High Ability to understand written High instructions: Ability to understand verbal High instructions: Motivation Assessment Anxiety Level: Calm Cooperation: Cooperative Education Importance: Acknowledges Need Interest in Health Problems: Asks Questions Perception: Coherent Willingness to Engage in Self- High Management Activities: Readiness to Engage in Self- High Management Activities: Electronic Signature(s) RADFORD, PEASE ([id number]) Signed: 01/02/2016 8:19:40 AM By: Alejandro Mulling Entered By: Alejandro Mulling on 01/01/2016 13:15:56 [name] ([id number]) -------------------------------------------------------------------------------- Fall Risk Assessment Details Patient Name: [name]. Date of Service: 01/01/2016 12:45 PM Medical Record Number: [id number] Patient Account Number: [id number] Date of Birth/Sex: 1946-03-17 (70 y.o. Male) Treating RN: Phillis Haggis Primary Care Physician: Oretha Milch Other Clinician: Referring Physician: Treating Physician/Extender: Rudene Re in Treatment: 0 Fall Risk Assessment Items Unable to Perform Assessment: Medical contraindication (finding) Notes pt states that he cant remember Electronic Signature(s) Signed: 01/02/2016 8:19:40 AM By: Alejandro Mulling Entered By: Alejandro Mulling on 01/01/2016 13:17:59 Cutshaw, Viraaj V. ([id number]) -------------------------------------------------------------------------------- Foot Assessment Details Patient Name: [name]. Date of Service: 01/01/2016 12:45 PM Medical Record Number: [id number] Patient Account Number: [id number] Date of Birth/Sex: 1946-09-03 (70 y.o. Male) Treating  RN: Phillis Haggis Primary Care Physician: Oretha Milch Other Clinician: Referring Physician: Treating Physician/Extender: Rudene Re in Treatment: 0 Foot Assessment Items Site Locations + = Sensation present, - = Sensation absent, C = Callus, U = Ulcer R = Redness, W = Warmth, M = Maceration, PU = Pre-ulcerative lesion F = Fissure, S = Swelling, D = Dryness Assessment Right: Left: Other Deformity: No No Prior Foot Ulcer: No No Prior Amputation: No No Charcot Joint: No No Ambulatory Status: Non-ambulatory Assistance Device: Wheelchair Gait: Surveyor, mining) Signed: 01/02/2016 8:19:40 AM By: Alejandro Mulling Entered By: Alejandro Mulling on 01/01/2016 14:08:56 Hackbart, Camaron Seth Bake ([id number]) -------------------------------------------------------------------------------- Nutrition Risk Assessment Details Patient Name: [name]. Date of Service: 01/01/2016 12:45 PM Medical Record Number: [id number] Patient Account Number: [id number] Date of Birth/Sex:  April 28, 1946 (70 y.o. Male) Treating RN: Ashok Cordia, Debi Primary Care Physician: Oretha Milch Other Clinician: Referring Physician: Treating Physician/Extender: Rudene Re in Treatment: 0 Height (in): 70 Weight (lbs): 235 Body Mass Index (BMI): 33.7 Nutrition Risk Assessment Items NUTRITION RISK SCREEN: I have an illness or condition that made me change the kind and/or 2 Yes amount of food I eat I eat fewer than two meals per day 0 No I eat few fruits and vegetables, or milk products 0 No I have three or more drinks of beer, liquor or wine almost every day 0 No I have tooth or mouth problems that make it hard for me to eat 0 No I don't always have enough money to buy the food I need 0 No I eat alone most of the time 0 No I take three or more different prescribed or over-the-counter drugs a 1 Yes day Without wanting to, I have lost or gained 10 pounds in the last six 2 Yes months I am not always physically able to shop, cook and/or feed myself 2 Yes Nutrition Protocols Good Risk Protocol Moderate Risk Protocol Electronic Signature(s) Signed: 01/02/2016 8:19:40 AM By: Alejandro Mulling Entered By: Alejandro Mulling on 01/01/2016 13:18:15

## 2016-01-04 ENCOUNTER — Inpatient Hospital Stay
Admission: EM | Admit: 2016-01-04 | Discharge: 2016-01-08 | DRG: 871 | Disposition: A | Discharge status: Skilled Nursing Facility | Payer: Medicare Other | Attending: Specialist | Admitting: Specialist

## 2016-01-04 ENCOUNTER — Encounter: Payer: Self-pay | Admitting: Emergency Medicine

## 2016-01-04 ENCOUNTER — Emergency Department: Payer: Medicare Other

## 2016-01-04 DIAGNOSIS — Z79899 Other long term (current) drug therapy: Secondary | ICD-10-CM | POA: Diagnosis not present

## 2016-01-04 DIAGNOSIS — Z794 Long term (current) use of insulin: Secondary | ICD-10-CM | POA: Diagnosis not present

## 2016-01-04 DIAGNOSIS — E119 Type 2 diabetes mellitus without complications: Secondary | ICD-10-CM

## 2016-01-04 DIAGNOSIS — Z820 Family history of epilepsy and other diseases of the nervous system: Secondary | ICD-10-CM | POA: Diagnosis not present

## 2016-01-04 DIAGNOSIS — N183 Chronic kidney disease, stage 3 (moderate): Secondary | ICD-10-CM | POA: Diagnosis present

## 2016-01-04 DIAGNOSIS — Z7401 Bed confinement status: Secondary | ICD-10-CM | POA: Diagnosis not present

## 2016-01-04 DIAGNOSIS — A4151 Sepsis due to Escherichia coli [E. coli]: Secondary | ICD-10-CM | POA: Diagnosis present

## 2016-01-04 DIAGNOSIS — Z22322 Carrier or suspected carrier of Methicillin resistant Staphylococcus aureus: Secondary | ICD-10-CM | POA: Diagnosis not present

## 2016-01-04 DIAGNOSIS — I248 Other forms of acute ischemic heart disease: Secondary | ICD-10-CM | POA: Diagnosis present

## 2016-01-04 DIAGNOSIS — Z87891 Personal history of nicotine dependence: Secondary | ICD-10-CM | POA: Diagnosis not present

## 2016-01-04 DIAGNOSIS — G8929 Other chronic pain: Secondary | ICD-10-CM | POA: Diagnosis present

## 2016-01-04 DIAGNOSIS — Z66 Do not resuscitate: Secondary | ICD-10-CM | POA: Diagnosis present

## 2016-01-04 DIAGNOSIS — F329 Major depressive disorder, single episode, unspecified: Secondary | ICD-10-CM | POA: Diagnosis present

## 2016-01-04 DIAGNOSIS — Z8249 Family history of ischemic heart disease and other diseases of the circulatory system: Secondary | ICD-10-CM

## 2016-01-04 DIAGNOSIS — Z951 Presence of aortocoronary bypass graft: Secondary | ICD-10-CM

## 2016-01-04 DIAGNOSIS — I2510 Atherosclerotic heart disease of native coronary artery without angina pectoris: Secondary | ICD-10-CM | POA: Diagnosis present

## 2016-01-04 DIAGNOSIS — Z955 Presence of coronary angioplasty implant and graft: Secondary | ICD-10-CM

## 2016-01-04 DIAGNOSIS — J9621 Acute and chronic respiratory failure with hypoxia: Secondary | ICD-10-CM | POA: Diagnosis present

## 2016-01-04 DIAGNOSIS — K219 Gastro-esophageal reflux disease without esophagitis: Secondary | ICD-10-CM | POA: Diagnosis present

## 2016-01-04 DIAGNOSIS — Z9049 Acquired absence of other specified parts of digestive tract: Secondary | ICD-10-CM

## 2016-01-04 DIAGNOSIS — J449 Chronic obstructive pulmonary disease, unspecified: Secondary | ICD-10-CM | POA: Diagnosis present

## 2016-01-04 DIAGNOSIS — N400 Enlarged prostate without lower urinary tract symptoms: Secondary | ICD-10-CM | POA: Diagnosis present

## 2016-01-04 DIAGNOSIS — D65 Disseminated intravascular coagulation [defibrination syndrome]: Secondary | ICD-10-CM | POA: Diagnosis not present

## 2016-01-04 DIAGNOSIS — G4733 Obstructive sleep apnea (adult) (pediatric): Secondary | ICD-10-CM | POA: Diagnosis present

## 2016-01-04 DIAGNOSIS — E1122 Type 2 diabetes mellitus with diabetic chronic kidney disease: Secondary | ICD-10-CM | POA: Diagnosis present

## 2016-01-04 DIAGNOSIS — M109 Gout, unspecified: Secondary | ICD-10-CM | POA: Diagnosis present

## 2016-01-04 DIAGNOSIS — G9341 Metabolic encephalopathy: Secondary | ICD-10-CM | POA: Diagnosis present

## 2016-01-04 DIAGNOSIS — B9689 Other specified bacterial agents as the cause of diseases classified elsewhere: Secondary | ICD-10-CM

## 2016-01-04 DIAGNOSIS — I130 Hypertensive heart and chronic kidney disease with heart failure and stage 1 through stage 4 chronic kidney disease, or unspecified chronic kidney disease: Secondary | ICD-10-CM | POA: Diagnosis present

## 2016-01-04 DIAGNOSIS — F419 Anxiety disorder, unspecified: Secondary | ICD-10-CM | POA: Diagnosis present

## 2016-01-04 DIAGNOSIS — N390 Urinary tract infection, site not specified: Secondary | ICD-10-CM | POA: Diagnosis present

## 2016-01-04 DIAGNOSIS — J189 Pneumonia, unspecified organism: Secondary | ICD-10-CM

## 2016-01-04 DIAGNOSIS — Z9981 Dependence on supplemental oxygen: Secondary | ICD-10-CM | POA: Diagnosis not present

## 2016-01-04 DIAGNOSIS — R319 Hematuria, unspecified: Secondary | ICD-10-CM

## 2016-01-04 DIAGNOSIS — I959 Hypotension, unspecified: Secondary | ICD-10-CM | POA: Diagnosis not present

## 2016-01-04 DIAGNOSIS — A419 Sepsis, unspecified organism: Secondary | ICD-10-CM | POA: Diagnosis present

## 2016-01-04 DIAGNOSIS — I5032 Chronic diastolic (congestive) heart failure: Secondary | ICD-10-CM | POA: Diagnosis present

## 2016-01-04 LAB — BLOOD GAS, ARTERIAL
ALLENS TEST (PASS/FAIL): POSITIVE — AB
Acid-Base Excess: 7 mmol/L — ABNORMAL HIGH (ref 0.0–3.0)
Bicarbonate: 32.5 mEq/L — ABNORMAL HIGH (ref 21.0–28.0)
FIO2: 1
O2 Saturation: 95.4 %
PATIENT TEMPERATURE: 37
PH ART: 7.43 (ref 7.350–7.450)
PO2 ART: 76 mmHg — AB (ref 83.0–108.0)
pCO2 arterial: 49 mmHg — ABNORMAL HIGH (ref 32.0–48.0)

## 2016-01-04 LAB — BRAIN NATRIURETIC PEPTIDE: B Natriuretic Peptide: 177 pg/mL — ABNORMAL HIGH (ref 0.0–100.0)

## 2016-01-04 LAB — COMPREHENSIVE METABOLIC PANEL
ALT: 7 U/L — ABNORMAL LOW (ref 17–63)
ANION GAP: 8 (ref 5–15)
AST: 19 U/L (ref 15–41)
Albumin: 3 g/dL — ABNORMAL LOW (ref 3.5–5.0)
Alkaline Phosphatase: 37 U/L — ABNORMAL LOW (ref 38–126)
BUN: 46 mg/dL — ABNORMAL HIGH (ref 6–20)
CHLORIDE: 99 mmol/L — AB (ref 101–111)
CO2: 33 mmol/L — AB (ref 22–32)
Calcium: 8.6 mg/dL — ABNORMAL LOW (ref 8.9–10.3)
Creatinine, Ser: 2.22 mg/dL — ABNORMAL HIGH (ref 0.61–1.24)
GFR calc non Af Amer: 28 mL/min — ABNORMAL LOW (ref >60)
GFR, EST AFRICAN AMERICAN: 33 mL/min — AB (ref >60)
Glucose, Bld: 122 mg/dL — ABNORMAL HIGH (ref 65–99)
Potassium: 4.5 mmol/L (ref 3.5–5.1)
SODIUM: 140 mmol/L (ref 135–145)
Total Bilirubin: 0.8 mg/dL (ref 0.3–1.2)
Total Protein: 6 g/dL — ABNORMAL LOW (ref 6.5–8.1)

## 2016-01-04 LAB — URINALYSIS COMPLETE WITH MICROSCOPIC (ARMC ONLY)
BILIRUBIN URINE: NEGATIVE
GLUCOSE, UA: NEGATIVE mg/dL
KETONES UR: NEGATIVE mg/dL
Nitrite: NEGATIVE
Protein, ur: 30 mg/dL — AB
SPECIFIC GRAVITY, URINE: 1.009 (ref 1.005–1.030)
pH: 6 (ref 5.0–8.0)

## 2016-01-04 LAB — CBC WITH DIFFERENTIAL/PLATELET
BASOS PCT: 0 %
Basophils Absolute: 0 10*3/uL (ref 0–0.1)
EOS ABS: 0.1 10*3/uL (ref 0–0.7)
EOS PCT: 1 %
HEMATOCRIT: 33.9 % — AB (ref 40.0–52.0)
Hemoglobin: 11.2 g/dL — ABNORMAL LOW (ref 13.0–18.0)
LYMPHS PCT: 3 %
Lymphs Abs: 0.2 10*3/uL — ABNORMAL LOW (ref 1.0–3.6)
MCH: 29 pg (ref 26.0–34.0)
MCHC: 33.1 g/dL (ref 32.0–36.0)
MCV: 87.6 fL (ref 80.0–100.0)
Monocytes Absolute: 0.1 10*3/uL — ABNORMAL LOW (ref 0.2–1.0)
Monocytes Relative: 1 %
NEUTROS PCT: 95 %
Neutro Abs: 6 10*3/uL (ref 1.4–6.5)
PLATELETS: 127 10*3/uL — AB (ref 150–440)
RBC: 3.87 MIL/uL — AB (ref 4.40–5.90)
RDW: 15 % — AB (ref 11.5–14.5)
WBC: 6.4 10*3/uL (ref 3.8–10.6)

## 2016-01-04 LAB — LIPASE, BLOOD: Lipase: 16 U/L (ref 11–51)

## 2016-01-04 LAB — CBC
HCT: 34.3 % — ABNORMAL LOW (ref 40.0–52.0)
Hemoglobin: 11.1 g/dL — ABNORMAL LOW (ref 13.0–18.0)
MCH: 28.5 pg (ref 26.0–34.0)
MCHC: 32.5 g/dL (ref 32.0–36.0)
MCV: 87.8 fL (ref 80.0–100.0)
Platelets: 116 10*3/uL — ABNORMAL LOW (ref 150–440)
RBC: 3.91 MIL/uL — ABNORMAL LOW (ref 4.40–5.90)
RDW: 15.4 % — AB (ref 11.5–14.5)
WBC: 12.7 10*3/uL — ABNORMAL HIGH (ref 3.8–10.6)

## 2016-01-04 LAB — TROPONIN I
TROPONIN I: 0.08 ng/mL — AB (ref <0.031)
TROPONIN I: 0.08 ng/mL — AB (ref <0.031)
Troponin I: 0.05 ng/mL — ABNORMAL HIGH (ref <0.031)

## 2016-01-04 LAB — GLUCOSE, CAPILLARY: Glucose-Capillary: 121 mg/dL — ABNORMAL HIGH (ref 65–99)

## 2016-01-04 LAB — MRSA PCR SCREENING: MRSA by PCR: POSITIVE — AB

## 2016-01-04 LAB — CREATININE, SERUM
CREATININE: 2.42 mg/dL — AB (ref 0.61–1.24)
GFR calc Af Amer: 30 mL/min — ABNORMAL LOW (ref >60)
GFR, EST NON AFRICAN AMERICAN: 25 mL/min — AB (ref >60)

## 2016-01-04 LAB — RAPID INFLUENZA A&B ANTIGENS
Influenza A (ARMC): NOT DETECTED
Influenza B (ARMC): NOT DETECTED

## 2016-01-04 LAB — LACTIC ACID, PLASMA
LACTIC ACID, VENOUS: 1.5 mmol/L (ref 0.5–2.0)
Lactic Acid, Venous: 2.3 mmol/L (ref 0.5–2.0)

## 2016-01-04 MED ORDER — GABAPENTIN 400 MG PO CAPS
400.0000 mg | ORAL_CAPSULE | Freq: Three times a day (TID) | ORAL | Status: DC
  Administered 2016-01-04 – 2016-01-07 (×11): 400 mg via ORAL
  Filled 2016-01-04 (×12): qty 1

## 2016-01-04 MED ORDER — ONDANSETRON HCL 4 MG/2ML IJ SOLN: 4.0000 mg | Freq: Four times a day (QID) | INTRAMUSCULAR | Status: DC | PRN

## 2016-01-04 MED ORDER — MORPHINE SULFATE (PF) 2 MG/ML IV SOLN: 1.0000 mg | INTRAVENOUS | Status: DC | PRN

## 2016-01-04 MED ORDER — TAMSULOSIN HCL 0.4 MG PO CAPS
0.4000 mg | ORAL_CAPSULE | Freq: Every day | ORAL | Status: DC
  Administered 2016-01-04 – 2016-01-08 (×5): 0.4 mg via ORAL
  Filled 2016-01-04 (×5): qty 1

## 2016-01-04 MED ORDER — SODIUM CHLORIDE 0.9 % IV SOLN
INTRAVENOUS | Status: DC
  Administered 2016-01-04 – 2016-01-06 (×6): via INTRAVENOUS

## 2016-01-04 MED ORDER — VANCOMYCIN HCL IN DEXTROSE 1-5 GM/200ML-% IV SOLN
1000.0000 mg | Freq: Once | INTRAVENOUS | Status: AC
  Administered 2016-01-04: 1000 mg via INTRAVENOUS
  Filled 2016-01-04: qty 200

## 2016-01-04 MED ORDER — ACETAMINOPHEN 650 MG RE SUPP: 650.0000 mg | Freq: Four times a day (QID) | RECTAL | Status: DC | PRN

## 2016-01-04 MED ORDER — PANTOPRAZOLE SODIUM 40 MG PO TBEC
40.0000 mg | DELAYED_RELEASE_TABLET | Freq: Every day | ORAL | Status: DC
  Administered 2016-01-04 – 2016-01-08 (×5): 40 mg via ORAL
  Filled 2016-01-04 (×5): qty 1

## 2016-01-04 MED ORDER — SODIUM CHLORIDE 0.9 % IV SOLN
Freq: Once | INTRAVENOUS | Status: AC
Start: 2016-01-04 — End: 2016-01-04
  Administered 2016-01-04: 16:00:00 via INTRAVENOUS

## 2016-01-04 MED ORDER — ACETAMINOPHEN 500 MG PO TABS
1000.0000 mg | ORAL_TABLET | Freq: Once | ORAL | Status: DC
  Filled 2016-01-04: qty 2

## 2016-01-04 MED ORDER — SODIUM CHLORIDE 0.9% FLUSH
3.0000 mL | Freq: Two times a day (BID) | INTRAVENOUS | Status: DC
Start: 2016-01-04 — End: 2016-01-08
  Administered 2016-01-04 – 2016-01-08 (×8): 3 mL via INTRAVENOUS

## 2016-01-04 MED ORDER — ACETAMINOPHEN 650 MG RE SUPP
650.0000 mg | Freq: Once | RECTAL | Status: AC
  Administered 2016-01-04: 650 mg via RECTAL
  Filled 2016-01-04: qty 1

## 2016-01-04 MED ORDER — PIPERACILLIN-TAZOBACTAM 3.375 G IVPB
3.3750 g | Freq: Three times a day (TID) | INTRAVENOUS | Status: DC
  Administered 2016-01-04: 3.375 g via INTRAVENOUS
  Filled 2016-01-04 (×3): qty 50

## 2016-01-04 MED ORDER — ACETAMINOPHEN 500 MG PO TABS: 500.0000 mg | ORAL_TABLET | Freq: Four times a day (QID) | ORAL | Status: DC | PRN

## 2016-01-04 MED ORDER — IPRATROPIUM-ALBUTEROL 0.5-2.5 (3) MG/3ML IN SOLN
3.0000 mL | Freq: Two times a day (BID) | RESPIRATORY_TRACT | Status: DC
  Administered 2016-01-04 – 2016-01-08 (×7): 3 mL via RESPIRATORY_TRACT
  Filled 2016-01-04 (×8): qty 3

## 2016-01-04 MED ORDER — LORATADINE 10 MG PO TABS
10.0000 mg | ORAL_TABLET | Freq: Every day | ORAL | Status: DC
  Administered 2016-01-04 – 2016-01-08 (×5): 10 mg via ORAL
  Filled 2016-01-04 (×5): qty 1

## 2016-01-04 MED ORDER — TRAZODONE HCL 50 MG PO TABS
50.0000 mg | ORAL_TABLET | Freq: Every day | ORAL | Status: DC
  Administered 2016-01-04 – 2016-01-07 (×4): 50 mg via ORAL
  Filled 2016-01-04 (×4): qty 1

## 2016-01-04 MED ORDER — OXYCODONE HCL 5 MG PO TABS
10.0000 mg | ORAL_TABLET | Freq: Four times a day (QID) | ORAL | Status: DC | PRN
  Administered 2016-01-06 – 2016-01-07 (×4): 10 mg via ORAL
  Administered 2016-01-07: 5 mg via ORAL
  Administered 2016-01-08: 10 mg via ORAL
  Filled 2016-01-04: qty 2
  Filled 2016-01-04 (×2): qty 1
  Filled 2016-01-04 (×3): qty 2

## 2016-01-04 MED ORDER — DIVALPROEX SODIUM 250 MG PO DR TAB
250.0000 mg | DELAYED_RELEASE_TABLET | Freq: Two times a day (BID) | ORAL | Status: DC
  Administered 2016-01-04 – 2016-01-08 (×8): 250 mg via ORAL
  Filled 2016-01-04 (×8): qty 1

## 2016-01-04 MED ORDER — SERTRALINE HCL 50 MG PO TABS
100.0000 mg | ORAL_TABLET | Freq: Every day | ORAL | Status: DC
  Administered 2016-01-04 – 2016-01-08 (×5): 100 mg via ORAL
  Filled 2016-01-04: qty 1
  Filled 2016-01-04 (×4): qty 2

## 2016-01-04 MED ORDER — NYSTATIN 100000 UNIT/GM EX POWD
Freq: Three times a day (TID) | CUTANEOUS | Status: DC
  Administered 2016-01-04 – 2016-01-07 (×10): via TOPICAL
  Filled 2016-01-04: qty 15

## 2016-01-04 MED ORDER — DEXTROMETHORPHAN POLISTIREX ER 30 MG/5ML PO SUER
60.0000 mg | Freq: Two times a day (BID) | ORAL | Status: DC | PRN
  Filled 2016-01-04 (×2): qty 10

## 2016-01-04 MED ORDER — HEPARIN SODIUM (PORCINE) 5000 UNIT/ML IJ SOLN
5000.0000 [IU] | Freq: Three times a day (TID) | INTRAMUSCULAR | Status: DC
  Administered 2016-01-04 – 2016-01-05 (×4): 5000 [IU] via SUBCUTANEOUS
  Filled 2016-01-04 (×4): qty 1

## 2016-01-04 MED ORDER — FINASTERIDE 5 MG PO TABS
5.0000 mg | ORAL_TABLET | Freq: Every day | ORAL | Status: DC
  Administered 2016-01-04 – 2016-01-08 (×5): 5 mg via ORAL
  Filled 2016-01-04 (×5): qty 1

## 2016-01-04 MED ORDER — LOPERAMIDE HCL 2 MG PO CAPS: 2.0000 mg | ORAL_CAPSULE | ORAL | Status: DC | PRN

## 2016-01-04 MED ORDER — ACETAMINOPHEN 325 MG PO TABS: 650.0000 mg | ORAL_TABLET | Freq: Four times a day (QID) | ORAL | Status: DC | PRN

## 2016-01-04 MED ORDER — MAGNESIUM HYDROXIDE 400 MG/5ML PO SUSP: 30.0000 mL | Freq: Every day | ORAL | Status: DC | PRN

## 2016-01-04 MED ORDER — SODIUM CHLORIDE 0.9 % IV SOLN
1500.0000 mg | INTRAVENOUS | Status: DC
  Administered 2016-01-04: 1500 mg via INTRAVENOUS
  Filled 2016-01-04 (×2): qty 1500

## 2016-01-04 MED ORDER — ONDANSETRON HCL 4 MG PO TABS: 4.0000 mg | ORAL_TABLET | Freq: Four times a day (QID) | ORAL | Status: DC | PRN

## 2016-01-04 MED ORDER — ALLOPURINOL 100 MG PO TABS
100.0000 mg | ORAL_TABLET | Freq: Every day | ORAL | Status: DC
  Administered 2016-01-04 – 2016-01-07 (×4): 100 mg via ORAL
  Filled 2016-01-04 (×4): qty 1

## 2016-01-04 MED ORDER — ADULT MULTIVITAMIN W/MINERALS CH
1.0000 | ORAL_TABLET | Freq: Every day | ORAL | Status: DC
  Administered 2016-01-04 – 2016-01-08 (×5): 1 via ORAL
  Filled 2016-01-04 (×5): qty 1

## 2016-01-04 MED ORDER — PIPERACILLIN-TAZOBACTAM 3.375 G IVPB 30 MIN
3.3750 g | Freq: Once | INTRAVENOUS | Status: AC
  Administered 2016-01-04: 3.375 g via INTRAVENOUS
  Filled 2016-01-04: qty 50

## 2016-01-04 NOTE — ED Notes (Signed)
Called Code Sepsis 

## 2016-01-04 NOTE — ED Notes (Signed)
Patient presents to the ED via [facility] from [employer].  Patient had high fever with EMS and shortness of breath with room air saturation of 79%.  EMS started 1st bolus of fluid and put patient on a non-rebreather.  Patient is alert and oriented to self and disoriented to time.  Patient's breath sounds are diminished.  Patient has wounds on both lower legs.  Patient's skin is hot to the touch.

## 2016-01-04 NOTE — Progress Notes (Signed)
Paged hospitalist; spoke to Dr. Elpidio Anis regarding pt's DBP low 30s-40s and MAP 50s.  Informed MD of no fluid boluses in ED due to CKD and CHF.  Pt has NS @ 100 ml/hr running at this time.  Foley in place with adequate urine output.  No new orders received.  Will continue to monitor.

## 2016-01-04 NOTE — ED Notes (Signed)
No fluid bolus per dr Shaune Pollack

## 2016-01-04 NOTE — ED Notes (Signed)
Per MD Derrill Kay NS infusion increased to 16ml/hr

## 2016-01-04 NOTE — ED Notes (Addendum)
Pt sent from [facility] for temp 104.6 oral. Pt will answer questions here. sats 79% room air for ems. Currently on NRB. Pt does not specify any complaints when asked.  Per dr Shaune Pollack no fluids at this time

## 2016-01-04 NOTE — ED Notes (Signed)
Dr Shaune Pollack notified lactate 2.3

## 2016-01-04 NOTE — ED Provider Notes (Signed)
[facility] Emergency Department Provider Note   ____________________________________________  Time seen: Approximately 1:20pm I have reviewed the triage vital signs and the triage nursing note.  HISTORY  Chief Complaint Code Sepsis and Shortness of Breath   Historian Limited - pt with respiratory distress and poor historian  HPI [name] is a 70 y.o. male with hx of copd, chf, morbid obesity here with shortness of breath, fever and O2 sats are 99% on room air. He does not wear home O2 but does wear CPAP at night. Patient is unable to tell me if he has weight gain or increased swelling. He does wear leg wraps to the lower extremity edema on his legs.  Pt reports sob, no cough.  No n/v/diarrhea.  Symptoms moderate - severe.    Past Medical History  Diagnosis Date  . Bacteremia   . GERD (gastroesophageal reflux disease)   . Hematuria   . Urinary incontinence   . Sleep apnea     on CPAP  . Hyperlipemia   . 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 COPD (chronic obstructive pulmonary disease) (HCC)   . Hypertension   . Lymphedema   . Anxiety disorder   . CHF (congestive heart failure) (HCC)     diastolic dysfunction, last ECHO from 2015, EF 50-55%  . Muscle weakness   . Morbid obesity (HCC)   . Difficulty in walking     bedbound  . Chronic respiratory failure (HCC)     on 2l oxygen continuous  . Diabetes mellitus (HCC)   . Gout   . CKD (chronic kidney disease)   . Heel ulcer (HCC)   . Urinary retention     Patient Active Problem List   Diagnosis Date Noted  . Cellulitis 09/07/2015  . Sepsis (HCC) 09/05/2015    Past Surgical History  Procedure Laterality Date  . Coronary angioplasty with stent placement    . Hernia repair      hiatal hernia repair  . Coronary artery bypass graft    . Appendectomy      Current Outpatient Rx  Name  Route  Sig  Dispense  Refill  . acetaminophen (TYLENOL)  500 MG tablet   Oral   Take 500 mg by mouth every 6 (six) hours as needed for mild pain or moderate pain.         Marland Kitchen allopurinol (ZYLOPRIM) 100 MG tablet   Oral   Take 100 mg by mouth at bedtime.         . collagenase (SANTYL) ointment   Topical   Apply 1 application topically daily. Pt applies to right heel.         Marland Kitchen dextromethorphan (DELSYM) 30 MG/5ML liquid   Oral   Take 60 mg by mouth every 12 (twelve) hours as needed for cough.         . divalproex (DEPAKOTE) 250 MG DR tablet   Oral   Take 250 mg by mouth 2 (two) times daily.         . finasteride (PROSCAR) 5 MG tablet   Oral   Take 1 tablet (5 mg total) by mouth daily.   30 tablet   0   . fluconazole (DIFLUCAN) 150 MG tablet   Oral   Take 150 mg by mouth once a week. Pt takes on Thursday.         . furosemide (LASIX) 20 MG tablet   Oral   Take  60 mg by mouth 2 (two) times daily.         Marland Kitchen gabapentin (NEURONTIN) 400 MG capsule   Oral   Take 400 mg by mouth 3 (three) times daily.         Marland Kitchen guaiFENesin (MUCINEX) 600 MG 12 hr tablet   Oral   Take 600 mg by mouth every 12 (twelve) hours.         . insulin NPH-regular Human (NOVOLIN 70/30) (70-30) 100 UNIT/ML injection   Subcutaneous   Inject 15-25 Units into the skin 2 (two) times daily. Pt uses 15 units in the morning and 25 units at bedtime.         Marland Kitchen ipratropium-albuterol (DUONEB) 0.5-2.5 (3) MG/3ML SOLN   Nebulization   Take 3 mLs by nebulization 2 (two) times daily.         Marland Kitchen loperamide (IMODIUM) 2 MG capsule   Oral   Take 2 mg by mouth every 4 (four) hours as needed for diarrhea or loose stools.         Marland Kitchen loratadine (CLARITIN) 10 MG tablet   Oral   Take 10 mg by mouth daily.         . magnesium hydroxide (MILK OF MAGNESIA) 400 MG/5ML suspension   Oral   Take 30 mLs by mouth daily as needed for mild constipation.         . Menthol, Topical Analgesic, (BIOFREEZE EX)   Apply externally   Apply 1 application topically  every 12 (twelve) hours as needed (for pain).          . metoprolol (LOPRESSOR) 50 MG tablet   Oral   Take 50 mg by mouth daily.         . Multiple Vitamin (MULTIVITAMIN WITH MINERALS) TABS tablet   Oral   Take 1 tablet by mouth daily.         Marland Kitchen nystatin (MYCOSTATIN/NYSTOP) 100000 UNIT/GM POWD   Topical   Apply topically 3 (three) times daily.         Marland Kitchen omeprazole (PRILOSEC) 20 MG capsule   Oral   Take 20 mg by mouth daily.          Marland Kitchen oxyCODONE (OXY IR/ROXICODONE) 5 MG immediate release tablet   Oral   Take 10-20 mg by mouth every 6 (six) hours as needed for severe pain.         Marland Kitchen oxyCODONE (OXY IR/ROXICODONE) 5 MG immediate release tablet   Oral   Take 10 mg by mouth 3 (three) times daily as needed for moderate pain or severe pain.         Marland Kitchen sertraline (ZOLOFT) 100 MG tablet   Oral   Take 100 mg by mouth daily.          . tamsulosin (FLOMAX) 0.4 MG CAPS capsule   Oral   Take 0.4 mg by mouth daily.         . traZODone (DESYREL) 50 MG tablet   Oral   Take 50 mg by mouth at bedtime.           Allergies Review of patient's allergies indicates no known allergies.  Family History  Problem Relation Age of Onset  . Kidney disease Neg Hx   . Prostate cancer Neg Hx     Social History Social History  Substance Use Topics  . Smoking status: Former Games developer  . Smokeless tobacco: None  . Alcohol Use: No    Review of Systems  Constitutional:  Pos for fever. Eyes: Negative for visual changes. ENT: Negative for sore throat. Cardiovascular: Negative for chest pain. Respiratory: Pos for shortness of breath. Gastrointestinal: Negative for abdominal pain, vomiting and diarrhea. Genitourinary: Negative for dysuria. Musculoskeletal: Negative for back pain. Skin: Negative for rash. Neurological: Negative for headache. 10 point Review of Systems otherwise negative ____________________________________________   PHYSICAL EXAM:  VITAL SIGNS: ED Triage  Vitals  Enc Vitals Group     BP 01/04/16 1322 142/86 mmHg     Pulse Rate 01/04/16 1322 90     Resp 01/04/16 1322 16     Temp 01/04/16 1322 103.2 F (39.6 C)     Temp Source 01/04/16 1322 Axillary     SpO2 01/04/16 1313 79 %     Weight 01/04/16 1322 290 lb 9.6 oz (131.815 kg)     Height 01/04/16 1322  (1.803 m)     Head Cir --      Peak Flow --      Pain Score --      Pain Loc --      Pain Edu? --      Excl. in GC? --      Constitutional: Alert and cooperative, but poor historian and disoriented. Moderate resp distress HEENT   Head: Normocephalic and atraumatic.      Eyes: Conjunctivae are normal. PERRL. Normal extraocular movements.      Ears:         Nose: No congestion/rhinnorhea.   Mouth/Throat: Mucous membranes are moist.   Neck: No stridor. Cardiovascular/Chest: Normal rate, regular rhythm.  No murmurs, rubs, or gallops. Respiratory: Normal respiratory effort without tachypnea nor retractions. Breath sounds are clear and equal bilaterally. No wheezes/rales/rhonchi. Gastrointestinal: Soft. Nontender.  Obese. distended Genitourinary/rectal:Deferred Musculoskeletal: Nontender with normal range of motion in all extremities. No joint effusions.  No lower extremity tenderness.  3-4+ LE pitting edema Neurologic:  Normal speech and language. No gross or focal neurologic deficits are appreciated. Skin:  Skin is warm, dry and intact. No rash noted. Psychiatric: No hallucinations  ____________________________________________   EKG I, Governor Rooks, MD, the attending physician have personally viewed and interpreted all ECGs.  94 bpm normal sinus rhythm. Narrow QRS. Normal axis. Normal ST and T-wave ____________________________________________  LABS (pertinent positives/negatives)  Urinalysis 1+ leukocytes, too numerous to count red blood cells, 6-30 white blood cells and rare bacteria ABG pH 7.43, PO2 76, P CO2 49 Comprehensive metabolic panel significant for  BUN 46 creatinine 2.22 White blood count is 0.4, hemoglobin 1. and platelet count 127 Lactate 2.3 BNP 137 Troponin 0.05 Lipase 16 Flu negative  ____________________________________________  RADIOLOGY All Xrays were viewed by me. Imaging interpreted by Radiologist.  Chest portable 1 view:   IMPRESSION: Limited study by poor inspiration. Chronic elevation of the right hemidiaphragm. Status post median sternotomy. Central mild vascular congestion without convincing pulmonary edema. Persistent left basilar atelectasis or infiltrate. __________________________________________  PROCEDURES  Procedure(s) performed: None  Critical Care performed: CRITICAL CARE Performed by: Governor Rooks   Total critical care time: 30 minutes  Critical care time was exclusive of separately billable procedures and treating other patients.  Critical care was necessary to treat or prevent imminent or life-threatening deterioration.  Critical care was time spent personally by me on the following activities: development of treatment plan with patient and/or surrogate as well as nursing, discussions with consultants, evaluation of patient's response to treatment, examination of patient, obtaining history from patient or surrogate, ordering and performing treatments and interventions, ordering and  review of laboratory studies, ordering and review of radiographic studies, pulse oximetry and re-evaluation of patient's condition.   ____________________________________________   ED COURSE / ASSESSMENT AND PLAN  Pertinent labs & imaging results that were available during my care of the patient were reviewed by me and considered in my medical decision making (see chart for details).   Right foot about 104 and hypoxic to mid 70s on room air. Even on nasal cannula patient is still hypoxic, requiring nonrebreather to maintain oxygenation in the mid 90s.   Clinically he looks like CHF exacerbation but with  a fever concern for sepsis. CODE sepsis order set is initiated. No pericardial fluid bolus given due to history of CHF.  Chest x-ray concerning for left lower lobe infiltrate. Patient was covered with anabolic for sepsis being less and Zosyn.  His urinalysis also showed signs of urinary tract infection.  Patient did come with DO NOT RESUSCITATE paperwork.    CONSULTATIONS:   Patient is consulted with the hospitalist for admission.   Patient / Family / Caregiver informed of clinical course, medical decision-making process, and agree with plan.     ___________________________________________   FINAL CLINICAL IMPRESSION(S) / ED DIAGNOSES   Final diagnoses:  Urinary tract infection with hematuria, site unspecified  Sepsis, due to unspecified organism (HCC)  Pneumonia involving left lung, unspecified part of lung              Note: This dictation was prepared with Dragon dictation. Any transcriptional errors that result from this process are unintentional   Governor Rooks, MD 01/04/16 1526

## 2016-01-04 NOTE — ED Notes (Signed)
Pt hypotensive.  md aware.  Iv fluids increased to 250cc/hr.  Family with pt.

## 2016-01-04 NOTE — H&P (Signed)
[facility] [place] [facility]   PATIENT NAME: [name]    MR#:  [id number]  DATE OF BIRTH:  05-11-1946  DATE OF ADMISSION:  01/04/2016  PRIMARY CARE PHYSICIAN: Dimple Casey, MD   REQUESTING/REFERRING PHYSICIAN: Dr. Governor Rooks  CHIEF COMPLAINT:   Chief Complaint  Patient presents with  . Code Sepsis  . Shortness of Breath    HISTORY OF PRESENT ILLNESS:  [name]  is a 70 y.o. male with a known history of COPD, hypertension, history of CHF, morbid obesity, chronic kidney disease stage III, diabetes, obstructive sleep apnea, GERD, who presents to the hospital due to fever and altered mental status and noted to be septic. Patient himself is a very poor historian therefore most history obtained from the family at bedside and also the ER physician. As per the family patient was in his usual state of health when this morning he was noted to have a fever of 103 and was more confused than usual. Patient presently denies any cough, shortness of breath, nausea, vomiting abdominal pain or any other associated symptoms. Due to his altered mental status he was sent to the ER for further evaluation. He was noted to be febrile and also noted to have chest x-ray findings suggestive of pneumonia and also underlying UTI. Hospitalist services were contacted further treatment and evaluation.  PAST MEDICAL HISTORY:   Past Medical History  Diagnosis Date  . Bacteremia   . GERD (gastroesophageal reflux disease)   . Hematuria   . Urinary incontinence   . Sleep apnea     on CPAP  . Hyperlipemia   . 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 COPD (chronic obstructive pulmonary disease) (HCC)   . Hypertension   . Lymphedema   . Anxiety disorder   . CHF (congestive heart failure) (HCC)     diastolic dysfunction, last ECHO from 2015, EF 50-55%  . Muscle weakness   . Morbid obesity (HCC)   . Difficulty in walking      bedbound  . Chronic respiratory failure (HCC)     on 2l oxygen continuous  . Diabetes mellitus (HCC)   . Gout   . CKD (chronic kidney disease)   . Heel ulcer (HCC)   . Urinary retention     PAST SURGICAL HISTORY:   Past Surgical History  Procedure Laterality Date  . Coronary angioplasty with stent placement    . Hernia repair      hiatal hernia repair  . Coronary artery bypass graft    . Appendectomy      SOCIAL HISTORY:   Social History  Substance Use Topics  . Smoking status: Former Games developer  . Smokeless tobacco: Not on file  . Alcohol Use: No    FAMILY HISTORY:   Family History  Problem Relation Age of Onset  . Kidney disease Neg Hx   . Prostate cancer Neg Hx   . Alzheimer's disease Mother   . Heart attack Father     DRUG ALLERGIES:  No Known Allergies  REVIEW OF SYSTEMS:   Review of Systems  Unable to perform ROS: mental acuity    MEDICATIONS AT HOME:   Prior to Admission medications   Medication Sig Start Date End Date Taking? Authorizing Provider  acetaminophen (TYLENOL) 500 MG tablet Take 500 mg by mouth every 6 (six) hours as needed for mild pain or moderate pain.   Yes Historical Provider, MD  allopurinol (ZYLOPRIM)  100 MG tablet Take 100 mg by mouth at bedtime.   Yes Historical Provider, MD  collagenase (SANTYL) ointment Apply 1 application topically daily. Pt applies to right heel.   Yes Historical Provider, MD  dextromethorphan (DELSYM) 30 MG/5ML liquid Take 60 mg by mouth every 12 (twelve) hours as needed for cough.   Yes Historical Provider, MD  divalproex (DEPAKOTE) 250 MG DR tablet Take 250 mg by mouth 2 (two) times daily.   Yes Historical Provider, MD  finasteride (PROSCAR) 5 MG tablet Take 1 tablet (5 mg total) by mouth daily. 09/07/15  Yes Enid Baas, MD  fluconazole (DIFLUCAN) 150 MG tablet Take 150 mg by mouth once a week. Pt takes on Thursday.   Yes Historical Provider, MD  furosemide (LASIX) 20 MG tablet Take 60 mg by mouth 2  (two) times daily.   Yes Historical Provider, MD  gabapentin (NEURONTIN) 400 MG capsule Take 400 mg by mouth 3 (three) times daily.   Yes Historical Provider, MD  guaiFENesin (MUCINEX) 600 MG 12 hr tablet Take 600 mg by mouth every 12 (twelve) hours.   Yes Historical Provider, MD  insulin NPH-regular Human (NOVOLIN 70/30) (70-30) 100 UNIT/ML injection Inject 15-25 Units into the skin 2 (two) times daily. Pt uses 15 units in the morning and 25 units at bedtime.   Yes Historical Provider, MD  ipratropium-albuterol (DUONEB) 0.5-2.5 (3) MG/3ML SOLN Take 3 mLs by nebulization 2 (two) times daily.   Yes Historical Provider, MD  loperamide (IMODIUM) 2 MG capsule Take 2 mg by mouth every 4 (four) hours as needed for diarrhea or loose stools.   Yes Historical Provider, MD  loratadine (CLARITIN) 10 MG tablet Take 10 mg by mouth daily.   Yes Historical Provider, MD  magnesium hydroxide (MILK OF MAGNESIA) 400 MG/5ML suspension Take 30 mLs by mouth daily as needed for mild constipation.   Yes Historical Provider, MD  Menthol, Topical Analgesic, (BIOFREEZE EX) Apply 1 application topically every 12 (twelve) hours as needed (for pain).    Yes Historical Provider, MD  metoprolol (LOPRESSOR) 50 MG tablet Take 50 mg by mouth daily.   Yes Historical Provider, MD  Multiple Vitamin (MULTIVITAMIN WITH MINERALS) TABS tablet Take 1 tablet by mouth daily.   Yes Historical Provider, MD  nystatin (MYCOSTATIN/NYSTOP) 100000 UNIT/GM POWD Apply topically 3 (three) times daily.   Yes Historical Provider, MD  omeprazole (PRILOSEC) 20 MG capsule Take 20 mg by mouth daily.    Yes Historical Provider, MD  oxyCODONE (OXY IR/ROXICODONE) 5 MG immediate release tablet Take 10-20 mg by mouth every 6 (six) hours as needed for severe pain.   Yes Historical Provider, MD  oxyCODONE (OXY IR/ROXICODONE) 5 MG immediate release tablet Take 10 mg by mouth 3 (three) times daily as needed for moderate pain or severe pain.   Yes Historical Provider, MD   sertraline (ZOLOFT) 100 MG tablet Take 100 mg by mouth daily.    Yes Historical Provider, MD  tamsulosin (FLOMAX) 0.4 MG CAPS capsule Take 0.4 mg by mouth daily.   Yes Historical Provider, MD  traZODone (DESYREL) 50 MG tablet Take 50 mg by mouth at bedtime.   Yes Historical Provider, MD      VITAL SIGNS:  Blood pressure 93/54, pulse 68, temperature 103.2 F (39.6 C), temperature source Axillary, resp. rate 17, height 5\' 11"  (1.803 m), weight 131.815 kg (290 lb 9.6 oz), SpO2 99 %.  PHYSICAL EXAMINATION:  Physical Exam  GENERAL:  70 y.o.-year-old obese patient lying in the  bed in mild respiratory distress. EYES: Pupils equal, round, reactive to light and accommodation. No scleral icterus. Extraocular muscles intact.  HEENT: Head atraumatic, normocephalic. Oropharynx and nasopharynx clear. No oropharyngeal erythema, moist oral mucosa  NECK:  Supple, no jugular venous distention. No thyroid enlargement, no tenderness.  LUNGS: Poor Respiratory effort, no wheezing, rales, rhonchi. No use of accessory muscles of respiration.  CARDIOVASCULAR: S1, S2 tachycardic. No murmurs, rubs, gallops, clicks.  ABDOMEN: Soft, nontender, nondistended. Bowel sounds present. No organomegaly or mass.  EXTREMITIES: +1 edema b/l, No cyanosis, or clubbing. + 2 pedal & radial pulses b/l.   NEUROLOGIC: Cranial nerves II through XII are intact. No focal Motor or sensory deficits appreciated b/l.  Globally weak.  PSYCHIATRIC: The patient is alert and oriented x 1. Lethargic, encephalopathic SKIN: No obvious rash, lesion, or ulcer. Signs of chronic venous stasis on lower extremities bilaterally.  LABORATORY PANEL:   CBC  Recent Labs Lab 01/04/16 1315  WBC 6.4  HGB 11.2*  HCT 33.9*  PLT 127*   ------------------------------------------------------------------------------------------------------------------  Chemistries   Recent Labs Lab 01/04/16 1315  NA 140  K 4.5  CL 99*  CO2 33*  GLUCOSE 122*  BUN  46*  CREATININE 2.22*  CALCIUM 8.6*  AST 19  ALT 7*  ALKPHOS 37*  BILITOT 0.8   ------------------------------------------------------------------------------------------------------------------  Cardiac Enzymes  Recent Labs Lab 01/04/16 1315  TROPONINI 0.05*   ------------------------------------------------------------------------------------------------------------------  RADIOLOGY:  Dg Chest Port 1 View  01/04/2016  CLINICAL DATA:  Shortness of breath, code sepsis EXAM: PORTABLE CHEST 1 VIEW COMPARISON:  09/05/2015 FINDINGS: Study is limited by poor inspiration. Again noted status post median sternotomy. There is chronic elevation of the left hemidiaphragm. Central vascular congestion without convincing pulmonary edema. Persistent left base retrocardiac atelectasis or infiltrate. IMPRESSION: Limited study by poor inspiration. Chronic elevation of the right hemidiaphragm. Status post median sternotomy. Central mild vascular congestion without convincing pulmonary edema. Persistent left basilar atelectasis or infiltrate. Electronically Signed   By: Natasha Mead M.D.   On: 01/04/2016 14:05     IMPRESSION AND PLAN:   70 year old male with past medical history of COPD, history of CHF, hypertension, chronic kidney disease stage III, gout, chronic pain, GERD, obstructive sleep apnea, anxiety who presents to the hospital due to altered mental status and fever.  #1 altered mental status-this is metabolic encephalopathy due to underlying UTI and pneumonia and sepsis. -Continue IV antibiotics IV fluids for the underlying sepsis and follow mental status.  #2 sepsis-this is suspected be secondary to pneumonia and also underlying UTI. -I will treat the patient with broad-spectrum IV antibiotics thank myosin, Zosyn. Follow blood, sputum cultures. Follow urine cultures. -Follow fever curve. Hemodynamically stable presently.  #3 pneumonia-continue IV vancomycin, Zosyn. -Follow blood, sputum  cultures.  #4 acute respiratory failure with hypoxia-this is secondary to pneumonia. -I will wean him off nonrebreather in place and on high flow nasal cannula -Continue IV antibiotics as mentioned above. Wean oxygen as tolerated.  #5 elevated troponin-likely setting of demand ischemia from underlying sepsis and hypoxemia. -Observe on telemetry, cycle cardiac markers.  #6 COPD-no acute exacerbation. Continue DuoNeb's.  #7 GERD-continue Protonix.  #8 chronic pain-continue oxycodone.  #9 depression-continue Zoloft.  #10 BPH-continue Flomax, finasteride.  #11 history of depression/anxiety-continue Depakote.  #12 history of gout-continue allopurinol. No acute attack presently.  #13 chronic kidney disease stage III-creatinine is currently at baseline. Will follow BUN and creatinine and renal dose meds accordingly.    All the records are reviewed and case discussed with  ED provider. Management plans discussed with the patient, family and they are in agreement.  CODE STATUS: DO NOT RESUSCITATE  TOTAL TIME TAKING CARE OF THIS PATIENT: 50 minutes.    Houston Siren M.D on 01/04/2016 at 4:19 PM  Between 7am to 6pm - Pager - [phone]  After 6pm go to [email or web address] - password [email or web address] [facility]  [facility]  Office  [phone]  CC: Primary care physician; Dimple Casey, MD

## 2016-01-04 NOTE — Progress Notes (Signed)
rn spoke to dr sainani re: pt with low bp ,febrile 103. And elevated lactic acid. Pt now on hi-flo. resp 30/min  Oxygen and ed rn reports pt condition is declining. Pt swabbed for flu antigen but not pcr and droplet isolation.rn spoke with md and suggestive transfer to higher level of care for sepsis and re_ swab for pcr

## 2016-01-04 NOTE — Consult Note (Signed)
Pharmacy Antibiotic Note  [name] is a 70 y.o. male admitted on 01/04/2016 with sepsis.  Pharmacy has been consulted for vancomycin and zosyn dosing.  Plan: Vancomycin 1500 IV every 24 hours.  Goal trough 15-20 mcg/mL. Zosyn 3.375g IV q8h (4 hour infusion).  Will start vancomycin 6 hours after initial dose (1g in ED) Ke=0.04 Vd=68.5 T1/2= 17hr Patient is at risk for accumulation due to weight. Will monitor closely. Continue to follow renal function. Trough prior to the 5th dose 0227 @ 1930  Height:  (180.3 cm) Weight: 290 lb 9.6 oz (131.815 kg) IBW/kg (Calculated) : 75.3  Temp (24hrs), Avg:103.2 F (39.6 C), Min:103.2 F (39.6 C), Max:103.2 F (39.6 C)   Recent Labs Lab 01/04/16 1315  WBC 6.4  CREATININE 2.22*  LATICACIDVEN 2.3*    Estimated Creatinine Clearance: 42.9 mL/min (by C-G formula based on Cr of 2.22).    No Known Allergies  Antimicrobials this admission: vancomycin 2/23 >>  zosyn 2/23 >>   Dose adjustments this admission:   Microbiology results: 2/23 BCx: pending 2/23 UCx: pending  2/23 flu neg  Thank you for allowing pharmacy [facility] of this patient's care.  Olene Floss 01/04/2016 4:31 PM

## 2016-01-05 LAB — CBC
HEMATOCRIT: 30.2 % — AB (ref 40.0–52.0)
HEMOGLOBIN: 10.3 g/dL — AB (ref 13.0–18.0)
MCH: 29.4 pg (ref 26.0–34.0)
MCHC: 34.1 g/dL (ref 32.0–36.0)
MCV: 86.3 fL (ref 80.0–100.0)
Platelets: 97 10*3/uL — ABNORMAL LOW (ref 150–440)
RBC: 3.5 MIL/uL — AB (ref 4.40–5.90)
RDW: 15.7 % — ABNORMAL HIGH (ref 11.5–14.5)
WBC: 7.2 10*3/uL (ref 3.8–10.6)

## 2016-01-05 LAB — BLOOD CULTURE ID PANEL (REFLEXED)
ACINETOBACTER BAUMANNII: NOT DETECTED
CANDIDA GLABRATA: NOT DETECTED
CANDIDA KRUSEI: NOT DETECTED
CANDIDA PARAPSILOSIS: NOT DETECTED
CANDIDA TROPICALIS: NOT DETECTED
CARBAPENEM RESISTANCE: NOT DETECTED
Candida albicans: NOT DETECTED
ENTEROBACTERIACEAE SPECIES: NOT DETECTED
Enterobacter cloacae complex: NOT DETECTED
Enterococcus species: NOT DETECTED
Escherichia coli: DETECTED — AB
Haemophilus influenzae: NOT DETECTED
Klebsiella oxytoca: NOT DETECTED
Klebsiella pneumoniae: NOT DETECTED
LISTERIA MONOCYTOGENES: NOT DETECTED
Methicillin resistance: NOT DETECTED
NEISSERIA MENINGITIDIS: NOT DETECTED
Proteus species: NOT DETECTED
Pseudomonas aeruginosa: NOT DETECTED
SERRATIA MARCESCENS: NOT DETECTED
STAPHYLOCOCCUS SPECIES: NOT DETECTED
STREPTOCOCCUS PYOGENES: NOT DETECTED
Staphylococcus aureus (BCID): NOT DETECTED
Streptococcus agalactiae: NOT DETECTED
Streptococcus pneumoniae: NOT DETECTED
Streptococcus species: NOT DETECTED
VANCOMYCIN RESISTANCE: NOT DETECTED

## 2016-01-05 LAB — INFLUENZA PANEL BY PCR (TYPE A & B)
H1N1FLUPCR: NOT DETECTED
INFLAPCR: NEGATIVE
INFLBPCR: NEGATIVE

## 2016-01-05 LAB — BASIC METABOLIC PANEL
Anion gap: 5 (ref 5–15)
BUN: 50 mg/dL — ABNORMAL HIGH (ref 6–20)
CO2: 34 mmol/L — ABNORMAL HIGH (ref 22–32)
Calcium: 8.5 mg/dL — ABNORMAL LOW (ref 8.9–10.3)
Chloride: 103 mmol/L (ref 101–111)
Creatinine, Ser: 2.32 mg/dL — ABNORMAL HIGH (ref 0.61–1.24)
GFR calc Af Amer: 31 mL/min — ABNORMAL LOW (ref >60)
GFR, EST NON AFRICAN AMERICAN: 27 mL/min — AB (ref >60)
GLUCOSE: 117 mg/dL — AB (ref 65–99)
Potassium: 4.9 mmol/L (ref 3.5–5.1)
SODIUM: 142 mmol/L (ref 135–145)

## 2016-01-05 LAB — GLUCOSE, CAPILLARY
GLUCOSE-CAPILLARY: 93 mg/dL (ref 65–99)
GLUCOSE-CAPILLARY: 84 mg/dL (ref 65–99)

## 2016-01-05 LAB — TROPONIN I: TROPONIN I: 0.09 ng/mL — AB (ref <0.031)

## 2016-01-05 MED ORDER — SODIUM CHLORIDE 0.9 % IV SOLN
1.0000 g | Freq: Two times a day (BID) | INTRAVENOUS | Status: DC
  Administered 2016-01-05 – 2016-01-07 (×6): 1 g via INTRAVENOUS
  Filled 2016-01-05 (×9): qty 1

## 2016-01-05 MED ORDER — MUPIROCIN 2 % EX OINT
1.0000 "application " | TOPICAL_OINTMENT | Freq: Two times a day (BID) | CUTANEOUS | Status: DC
  Administered 2016-01-05 – 2016-01-07 (×7): 1 via NASAL
  Filled 2016-01-05: qty 22

## 2016-01-05 NOTE — Progress Notes (Signed)
Notified Dr. Seth Bake that patient take novolog 70/30 on home med list- have been checking sugars- patient's last sugar was in the 80's- patient passed nursing swallow screen.  Per Dr. Seth Bake- order dysphagia 1 carb. Modified diet and hold off insulin at this time.

## 2016-01-05 NOTE — Consult Note (Signed)
Pharmacy Antibiotic Note  [name] is a 70 y.o. male admitted on 01/04/2016 with sepsis.  Pharmacy has been consulted for vancomycin and zosyn dosing.  Plan: Vancomycin 1500 IV every 24 hours.  Goal trough 15-20 mcg/mL. Zosyn 3.375g IV q8h (4 hour infusion).  Will start vancomycin 6 hours after initial dose (1g in ED) Ke=0.04 Vd=68.5 T1/2= 17hr Patient is at risk for accumulation due to weight. Will monitor closely. Continue to follow renal function. Trough prior to the 5th dose 0227 @ 1930  Height:  (180.3 cm) Weight: 290 lb 9.6 oz (131.815 kg) IBW/kg (Calculated) : 75.3  Temp (24hrs), Avg:101.6 F (38.7 C), Min:99.7 F (37.6 C), Max:103.2 F (39.6 C)   Recent Labs Lab 01/04/16 1315 01/04/16 1737 01/04/16 2059  WBC 6.4  --  12.7*  CREATININE 2.22*  --  2.42*  LATICACIDVEN 2.3* 1.5  --     Estimated Creatinine Clearance: 39.3 mL/min (by C-G formula based on Cr of 2.42).    No Known Allergies  Antimicrobials this admission: vancomycin 2/23 >>  zosyn 2/23 >>   Dose adjustments this admission:   Microbiology results: 2/23 BCx: pending 2/23 UCx: pending  2/23 flu neg 2/24 Biofire returned E.coli, KPC (-). Zosyn changed to meropenem per conversation with hospitalist.  Thank you for allowing pharmacy to be a part of this patient's care.  Lasharn Bufkin S 01/05/2016 4:04 AM

## 2016-01-05 NOTE — Consult Note (Deleted)
Pharmacy Antibiotic Note  [name] is a 70 y.o. male admitted on 01/04/2016 with sepsis.  Pharmacy has been consulted for vancomycin and zosyn dosing.  Plan: Will continue vancomycin 1500 IV every 24 hours.  Goal trough 15-20 mcg/mL. Will continue Zosyn 3.375g IV q8h (4 hour infusion).  Patient is at risk for accumulation due to weight. Will monitor closely. Continue to follow renal function. Trough prior to the 5th dose 0227 @ 1930  Height:  (177.8 cm) Weight: 273 lb 13 oz (124.2 kg) IBW/kg (Calculated) : 73  Temp (24hrs), Avg:99.6 F (37.6 C), Min:98.1 F (36.7 C), Max:101.2 F (38.4 C)   Recent Labs Lab 01/04/16 1315 01/04/16 1737 01/04/16 2059 01/05/16 0657  WBC 6.4  --  12.7* 7.2  CREATININE 2.22*  --  2.42* 2.32*  LATICACIDVEN 2.3* 1.5  --   --     Estimated Creatinine Clearance: 39.2 mL/min (by C-G formula based on Cr of 2.32).    No Known Allergies  Antimicrobials this admission: vancomycin 2/23 >>  zosyn 2/23 >>   Dose adjustments this admission:   Microbiology results: 2/23 BCx: Enterobacter cloacae, E coli 2/23 UCx: GNR 2/23 flu neg 2/23 MRSA PCR positive  Thank you for allowing pharmacy to be a part of this patient's care.  Valentina Gu 01/05/2016 3:41 PM

## 2016-01-05 NOTE — Progress Notes (Addendum)
Notified Dr. Ardyth Man of troponin of 0.09.  No new orders.

## 2016-01-05 NOTE — Progress Notes (Signed)
RT placed patient on 4LPM [place], tolerating well at this time, O2 sat 99%.  Annabelle Harman, RN made aware of change

## 2016-01-05 NOTE — Clinical Documentation Improvement (Signed)
Hospitalist  (Query responses must be documented in the current medical record, not on the CDI BPA form in [facility].)  "History of CHF" and "diastolic dysfunction, last ECHO from 2015, EF 50-55%" are documented in the current medical record.  For greater specificity, please document the acuity and type of heart failure monitored and treated this admission   Please exercise your independent, professional judgment when responding. A specific answer is not anticipated or expected.   Thank You, Jerral Ralph  RN [name] CCDS [phone] Health Information Management [place]

## 2016-01-05 NOTE — Care Management (Signed)
Patient admitted from [facility].  It appears he is followed by [facility] at the facility.  He is DNR.  Admitted for sx concerning of sepsis

## 2016-01-05 NOTE — Consult Note (Signed)
WOC wound consult note Reason for Consult: Chronic venous stasis changes to left anterior lower leg.  Right heel unstageable pressure injury.   Wound type:Venous stasis and pressure. Intertriginous dermatitis to abdominal pannus, left flank Pressure Ulcer POA: Yes Measurement: Right heel 4 cm x 2 cm scabbed, devitalized tissue obscuring wound bed.  LEft flank has 4 cm linear erythema Left anterior lower leg with 6 cm x 4 cm raised cracked plaque of skin, scabbed and consistent with chronic venous changes.  + pedal pulses. Wears UNnas boots, changed weekly.  Wound WUJ:WJXBJYN, devitalized tissue.  Drainage (amount, consistency, odor) Moderate serosnaguinous drainage.  Musty odor.  Periwound:Dry skin to bilateral lower legs.  Dressing procedure/placement/frequency: CLeanse bilateral lower legs with soap and water.  Apply Aquacel Ag to right heel and left anterior lower leg.  Cover with zinc layer then self adherent (Coban ) layer.  Change weekly.  On Friday.  Will not follow at this time.  Please re-consult if needed.  Maple Hudson RN [name] CWON Pager [phone]

## 2016-01-05 NOTE — Progress Notes (Addendum)
[facility] - [place] at [facility]   PATIENT NAME: [name]    MR#:  [id number]  DATE OF BIRTH:  27-May-1946  SUBJECTIVE:  CHIEF COMPLAINT:   Chief Complaint  Patient presents with  . Code Sepsis  . Shortness of Breath   patient is 70 year old Caucasian male with past medical history significant for COPD, hypertension, CHF, morbid obesity, CK D stage III, diabetes, severe sleep apnea, gastroesophageal reflux disease who was brought to the hospital with altered mental status/confusion and fever. Patient denied any symptoms, however, his chest x-ray was concerning for pneumonia and the pyuria/UTI. Patient was initiated on meropenem and admitted to the hospital for further evaluation and treatment. He remains somnolent and not able to provide review of systems. He remains on high flow oxygen through nasal cannulas, his O2 sats 100%  Review of Systems  Unable to perform ROS: critical illness    VITAL SIGNS: Blood pressure 117/56, pulse 71, temperature 98.5 F (36.9 C), temperature source Oral, resp. rate 18, height  (1.778 m), weight 124.2 kg (273 lb 13 oz), SpO2 99 %.  PHYSICAL EXAMINATION:   GENERAL:  70 y.o.-year-old patient lying in the bed with no acute distress. Very somnolent, however, able to open his eyes and briefly answer questions, does not follow commands EYES: Pupils equal, round, reactive to light and accommodation. No scleral icterus. Extraocular muscles intact.  HEENT: Head atraumatic, normocephalic. Oropharynx and nasopharynx clear.  NECK:  Supple, no jugular venous distention. No thyroid enlargement, no tenderness.  LUNGS: Diminished breath sounds bilaterally, no wheezing, few rales,rhonchi or crepitation. No use of accessory muscles of respiration.  CARDIOVASCULAR: S1, S2 normal. No murmurs, rubs, or gallops.  ABDOMEN: Soft, nontender, nondistended. Bowel sounds present. No organomegaly or mass.  EXTREMITIES: 1+ lower extremity and  pedal edema, no cyanosis, or clubbing. In Kerlix wrap NEUROLOGIC: Cranial nerves II through XII difficult to examine as patient is lethargic, although grossly intact . Muscle strength,  Sensation , unable to examine due to somnolence. Gait not checked.  PSYCHIATRIC: The patient is somnolent, difficult to assess orientation SKIN: No obvious rash, lesion, or ulcer.   ORDERS/RESULTS REVIEWED:   CBC  Recent Labs Lab 01/04/16 1315 01/04/16 2059 01/05/16 0657  WBC 6.4 12.7* 7.2  HGB 11.2* 11.1* 10.3*  HCT 33.9* 34.3* 30.2*  PLT 127* 116* 97*  MCV 87.6 87.8 86.3  MCH 29.0 28.5 29.4  MCHC 33.1 32.5 34.1  RDW 15.0* 15.4* 15.7*  LYMPHSABS 0.2*  --   --   MONOABS 0.1*  --   --   EOSABS 0.1  --   --   BASOSABS 0.0  --   --    ------------------------------------------------------------------------------------------------------------------  Chemistries   Recent Labs Lab 01/04/16 1315 01/04/16 2059 01/05/16 0657  NA 140  --  142  K 4.5  --  4.9  CL 99*  --  103  CO2 33*  --  34*  GLUCOSE 122*  --  117*  BUN 46*  --  50*  CREATININE 2.22* 2.42* 2.32*  CALCIUM 8.6*  --  8.5*  AST 19  --   --   ALT 7*  --   --   ALKPHOS 37*  --   --   BILITOT 0.8  --   --    ------------------------------------------------------------------------------------------------------------------ estimated creatinine clearance is 39.2 mL/min (by C-G formula based on Cr of 2.32). ------------------------------------------------------------------------------------------------------------------ No results for input(s): TSH, T4TOTAL, T3FREE, THYROIDAB in the last 72 hours.  Invalid input(s):  FREET3  Cardiac Enzymes  Recent Labs Lab 01/04/16 1632 01/04/16 2059 01/05/16 0657  TROPONINI 0.08* 0.08* 0.09*   ------------------------------------------------------------------------------------------------------------------ Invalid input(s):  POCBNP ---------------------------------------------------------------------------------------------------------------  RADIOLOGY: Dg Chest Port 1 View  01/04/2016  CLINICAL DATA:  Shortness of breath, code sepsis EXAM: PORTABLE CHEST 1 VIEW COMPARISON:  09/05/2015 FINDINGS: Study is limited by poor inspiration. Again noted status post median sternotomy. There is chronic elevation of the left hemidiaphragm. Central vascular congestion without convincing pulmonary edema. Persistent left base retrocardiac atelectasis or infiltrate. IMPRESSION: Limited study by poor inspiration. Chronic elevation of the right hemidiaphragm. Status post median sternotomy. Central mild vascular congestion without convincing pulmonary edema. Persistent left basilar atelectasis or infiltrate. Electronically Signed   By: Natasha Mead M.D.   On: 01/04/2016 14:05    EKG:  Orders placed or performed during the hospital encounter of 01/04/16  . ED EKG 12-Lead  . ED EKG 12-Lead    ASSESSMENT AND PLAN:  Active Problems:   Sepsis (HCC)  #1. Sepsis due to Escherichia coli, continue meropenem awaiting for sensitivities. #2. Urinary tract infection due to gram-negative rods, identification and sensitivities are pending, continue meropenem for now. #3. Hypotension, seems to be stable, continue IV fluids, keeping map is around 65 and above. #4. CK D stage III, stable with therapy. #5. Metabolic encephalopathy due to sepsis, follow closely. Get speech therapist evaluation,  #6. Questionable left lobe lobe pneumonia, questionable aspiration, continue meropenem, patient is not producing any sputum at present #7. Elevated troponin, likely demand ischemia. #8, thrombocytopenia, likely DIC, follow in the morning, hold heparin #9 . Leukocytosis, resolved with antibiotic therapy. #10, fever, no influenza. #11. MRSA carrier, initiated mupirocin  Management plans discussed with the patient, family and they are in agreement.   DRUG  ALLERGIES: No Known Allergies  CODE STATUS:     Code Status Orders        Start     Ordered   01/04/16 1720  Do not attempt resuscitation (DNR)   Continuous    Question Answer Comment  In the event of cardiac or respiratory ARREST Do not call a "code blue"   In the event of cardiac or respiratory ARREST Do not perform Intubation, CPR, defibrillation or ACLS   In the event of cardiac or respiratory ARREST Use medication by any route, position, wound care, and other measures to relive pain and suffering. May use oxygen, suction and manual treatment of airway obstruction as needed for comfort.      01/04/16 1719    Code Status History    Date Active Date Inactive Code Status Order ID Comments User Context   09/05/2015  5:29 PM 09/07/2015  5:20 PM DNR [id number]  Enid Baas, MD Inpatient      TOTAL CRITICAL CARE TIME TAKING CARE OF THIS PATIENT: 45 minutes minutes.    Katharina Caper M.D on 01/05/2016 at 3:54 PM  Between 7am to 6pm - Pager - [phone]  After 6pm go to [email or web address] - password [email or web address] [facility]  [facility]  Office  [phone]  CC: Primary care physician; Dimple Casey, MD

## 2016-01-05 NOTE — Care Management (Signed)
Patient is not being followed by [facility].  He was closed to service 11/2015 due to stability.  He had been followed for copd

## 2016-01-05 NOTE — Plan of Care (Signed)
Problem: Respiratory: Goal: Ability to maintain adequate ventilation will improve Outcome: Progressing Pt currently on HFNC at 45% with O2 sats 96%

## 2016-01-06 LAB — URINE CULTURE

## 2016-01-06 LAB — CBC
HEMATOCRIT: 29.2 % — AB (ref 40.0–52.0)
Hemoglobin: 9.7 g/dL — ABNORMAL LOW (ref 13.0–18.0)
MCH: 28.7 pg (ref 26.0–34.0)
MCHC: 33.2 g/dL (ref 32.0–36.0)
MCV: 86.3 fL (ref 80.0–100.0)
Platelets: 92 10*3/uL — ABNORMAL LOW (ref 150–440)
RBC: 3.39 MIL/uL — AB (ref 4.40–5.90)
RDW: 15.3 % — ABNORMAL HIGH (ref 11.5–14.5)
WBC: 4.1 10*3/uL (ref 3.8–10.6)

## 2016-01-06 LAB — BASIC METABOLIC PANEL
Anion gap: 5 (ref 5–15)
BUN: 48 mg/dL — AB (ref 6–20)
CHLORIDE: 103 mmol/L (ref 101–111)
CO2: 29 mmol/L (ref 22–32)
Calcium: 8.1 mg/dL — ABNORMAL LOW (ref 8.9–10.3)
Creatinine, Ser: 2 mg/dL — ABNORMAL HIGH (ref 0.61–1.24)
GFR calc Af Amer: 37 mL/min — ABNORMAL LOW (ref >60)
GFR calc non Af Amer: 32 mL/min — ABNORMAL LOW (ref >60)
Glucose, Bld: 93 mg/dL (ref 65–99)
POTASSIUM: 4.2 mmol/L (ref 3.5–5.1)
SODIUM: 137 mmol/L (ref 135–145)

## 2016-01-06 LAB — GLUCOSE, CAPILLARY: Glucose-Capillary: 83 mg/dL (ref 65–99)

## 2016-01-06 MED ORDER — HEPARIN SODIUM (PORCINE) 5000 UNIT/ML IJ SOLN
5000.0000 [IU] | Freq: Three times a day (TID) | INTRAMUSCULAR | Status: DC
  Administered 2016-01-06 – 2016-01-08 (×5): 5000 [IU] via SUBCUTANEOUS
  Filled 2016-01-06 (×5): qty 1

## 2016-01-06 NOTE — Progress Notes (Signed)
[facility] - [place] at [facility]   PATIENT NAME: [name]    MR#:  [id number]  DATE OF BIRTH:  1946-02-25  SUBJECTIVE:   Patient is here due to sepsis due to UTI. Blood cultures and urine cultures are positive for Escherichia coli. Mental status is much improved.    REVIEW OF SYSTEMS:    Review of Systems  Constitutional: Negative for fever and chills.  HENT: Negative for congestion and tinnitus.   Eyes: Negative for blurred vision and double vision.  Respiratory: Negative for cough, shortness of breath and wheezing.   Cardiovascular: Negative for chest pain, orthopnea and PND.  Gastrointestinal: Negative for nausea, vomiting, abdominal pain and diarrhea.  Genitourinary: Negative for dysuria and hematuria.  Neurological: Positive for weakness (generalized). Negative for dizziness, sensory change and focal weakness.  All other systems reviewed and are negative.   Nutrition: Dysphagia 1 Tolerating Diet: Yes Tolerating PT: Await Eval.    DRUG ALLERGIES:  No Known Allergies  VITALS:  Blood pressure 138/60, pulse 70, temperature 98.1 F (36.7 C), temperature source Oral, resp. rate 17, height  (1.778 m), weight 124.2 kg (273 lb 13 oz), SpO2 96 %.  PHYSICAL EXAMINATION:   Physical Exam  GENERAL:  70 y.o.-year-old obese patient lying in the bed in no acute distress.  EYES: Pupils equal, round, reactive to light and accommodation. No scleral icterus. Extraocular muscles intact.  HEENT: Head atraumatic, normocephalic. Oropharynx and nasopharynx clear.  NECK:  Supple, no jugular venous distention. No thyroid enlargement, no tenderness.  LUNGS: Normal breath sounds bilaterally, no wheezing, rales, rhonchi. No use of accessory muscles of respiration.  CARDIOVASCULAR: S1, S2 normal. No murmurs, rubs, or gallops.  ABDOMEN: Soft, nontender, nondistended. Bowel sounds present. No organomegaly or mass.  EXTREMITIES: No cyanosis, clubbing or edema  b/l.    NEUROLOGIC: Cranial nerves II through XII are intact. No focal Motor or sensory deficits b/l.   PSYCHIATRIC: The patient is alert and oriented x 3.  Good affect.   SKIN: No obvious rash, lesion, or ulcer.    LABORATORY PANEL:   CBC  Recent Labs Lab 01/06/16 0728  WBC 4.1  HGB 9.7*  HCT 29.2*  PLT 92*   ------------------------------------------------------------------------------------------------------------------  Chemistries   Recent Labs Lab 01/04/16 1315  01/06/16 0728  NA 140  < > 137  K 4.5  < > 4.2  CL 99*  < > 103  CO2 33*  < > 29  GLUCOSE 122*  < > 93  BUN 46*  < > 48*  CREATININE 2.22*  < > 2.00*  CALCIUM 8.6*  < > 8.1*  AST 19  --   --   ALT 7*  --   --   ALKPHOS 37*  --   --   BILITOT 0.8  --   --   < > = values in this interval not displayed. ------------------------------------------------------------------------------------------------------------------  Cardiac Enzymes  Recent Labs Lab 01/05/16 0657  TROPONINI 0.09*   ------------------------------------------------------------------------------------------------------------------  RADIOLOGY:  No results found.   ASSESSMENT AND PLAN:   70 year old male with past medical history of COPD, history of CHF, hypertension, chronic kidney disease stage III, gout, chronic pain, GERD, obstructive sleep apnea, anxiety who presents to the hospital due to altered mental status and fever.  #1 altered mental status-this is metabolic encephalopathy due to underlying UTI, sepsis. -Continue IV meropenem, IV fluids. Mental status much improved and back to baseline.  #2 sepsis-this is a urinary tract infection. -Patient's blood and  urine cultures are positive for Escherichia coli. -Continue IV meropenem for now.  Urine cultures sensitivities are back but the blood culture sensitivities are still pending. We'll narrow down antibiotics accordingly. -Afebrile and hemodynamically stable.  #3  pneumonia-initially suspected on admission but not ruled out. -Continue IV meropenem. We'll follow sputum cultures. Continue DuoNeb's.  #4 acute respiratory failure with hypoxia-this was due to sepsis, COPD.  Pneumonia ruled out.  -O2 requirements have significantly improved. now off high flow nasal cannula and weaned to just 3L [place] and will monitor.   #5 elevated troponin-likely setting of demand ischemia from underlying sepsis and hypoxemia. -troponin's did not trend up.  No chest pain. Will d/c Tele today.   #6 COPD-no acute exacerbation. Continue DuoNeb's.  #7 GERD-continue Protonix.  #8 chronic pain-continue oxycodone.  #9 depression-continue Zoloft.  #10 BPH-continue Flomax, finasteride.  #11 history of depression/anxiety-continue Depakote.  #12 history of gout-continue allopurinol. No acute attack presently.  #13 chronic kidney disease stage III-creatinine is currently at baseline. Will follow BUN and creatinine  #14 hx of chronic diastolic CHF - hold off resuming Metoprolol, Lasix, given relative hypotension at this time.  Will resume tomorrow if BP is improving and Cr. Stable.     All the records are reviewed and case discussed with Care Management/Social Workerr. Management plans discussed with the patient, family and they are in agreement.  CODE STATUS: DNR  DVT Prophylaxis: Heparin subcutaneous  TOTAL TIME TAKING CARE OF THIS PATIENT: 30 minutes.   POSSIBLE D/C IN 2-3 DAYS, DEPENDING ON CLINICAL CONDITION.   Houston Siren M.D on 01/06/2016 at 2:02 PM  Between 7am to 6pm - Pager - [phone]  After 6pm go to [email or web address] - password [email or web address] [facility]  [place] [facility]  Office  [phone]  CC: Primary care physician; Dimple Casey, MD

## 2016-01-06 NOTE — NC FL2 (Signed)
MEDICAID FL2 LEVEL OF CARE SCREENING TOOL     IDENTIFICATION  Patient Name: [name] Birthdate: 06/19/1946 Sex: male Admission Date (Current Location): 01/04/2016  [place] and [place] Number:  [name] [id number] [facility] Facility and Address:  [facility], [address], [place], [place] [address]      Provider Number: [id number]  Attending Physician Name and Address:  Houston Siren, MD  Relative Name and Phone Number:       Current Level of Care: Hospital Recommended Level of Care: Skilled Nursing Facility Prior Approval Number:    Date Approved/Denied:   PASRR Number:    Discharge Plan: SNF    Current Diagnoses: Patient Active Problem List   Diagnosis Date Noted  . Cellulitis 09/07/2015  . Sepsis (HCC) 09/05/2015    Orientation RESPIRATION BLADDER Height & Weight     Self, Time, Situation, Place  O2 Continent Weight: 273 lb 13 oz (124.2 kg) Height:   (177.8 cm)  BEHAVIORAL SYMPTOMS/MOOD NEUROLOGICAL BOWEL NUTRITION STATUS      Continent Diet  AMBULATORY STATUS COMMUNICATION OF NEEDS Skin   Extensive Assist Verbally Normal                       Personal Care Assistance Level of Assistance  Bathing, Feeding, Dressing Bathing Assistance: Maximum assistance Feeding assistance: Independent Dressing Assistance: Maximum assistance     Functional Limitations Info  Sight, Hearing, Speech Sight Info: Adequate Hearing Info: Adequate Speech Info: Adequate    SPECIAL CARE FACTORS FREQUENCY                       Contractures      Additional Factors Info  Code Status, Allergies, Isolation Precautions Code Status Info: DNR Allergies Info: NKA     Isolation Precautions Info: MRSA     Current Medications (01/06/2016):  This is the current hospital active medication list Current Facility-Administered Medications  Medication Dose Route Frequency Provider Last Rate Last Dose  . 0.9 %  sodium chloride  infusion   Intravenous Continuous Houston Siren, MD 100 mL/hr at 01/06/16 1245    . acetaminophen (TYLENOL) tablet 650 mg  650 mg Oral Q6H PRN Houston Siren, MD       Or  . acetaminophen (TYLENOL) suppository 650 mg  650 mg Rectal Q6H PRN Houston Siren, MD      . acetaminophen (TYLENOL) tablet 500 mg  500 mg Oral Q6H PRN Houston Siren, MD      . allopurinol (ZYLOPRIM) tablet 100 mg  100 mg Oral QHS Houston Siren, MD   100 mg at 01/05/16 2250  . dextromethorphan (DELSYM) 30 MG/5ML liquid 60 mg  60 mg Oral Q12H PRN Houston Siren, MD      . divalproex (DEPAKOTE) DR tablet 250 mg  250 mg Oral BID Houston Siren, MD   250 mg at 01/06/16 1248  . finasteride (PROSCAR) tablet 5 mg  5 mg Oral Daily Houston Siren, MD   5 mg at 01/06/16 1248  . gabapentin (NEURONTIN) capsule 400 mg  400 mg Oral TID Houston Siren, MD   400 mg at 01/06/16 1248  . heparin injection 5,000 Units  5,000 Units Subcutaneous 3 times per day Houston Siren, MD      . ipratropium-albuterol (DUONEB) 0.5-2.5 (3) MG/3ML nebulizer solution 3 mL  3 mL Nebulization BID Houston Siren, MD   3 mL at  01/06/16 0719  . loperamide (IMODIUM) capsule 2 mg  2 mg Oral Q4H PRN Houston Siren, MD      . loratadine (CLARITIN) tablet 10 mg  10 mg Oral Daily Houston Siren, MD   10 mg at 01/06/16 1248  . magnesium hydroxide (MILK OF MAGNESIA) suspension 30 mL  30 mL Oral Daily PRN Houston Siren, MD      . meropenem (MERREM) 1 g in sodium chloride 0.9 % 100 mL IVPB  1 g Intravenous Q12H Srikar Sudini, MD   1 g at 01/06/16 1245  . morphine 2 MG/ML injection 1 mg  1 mg Intravenous Q4H PRN Houston Siren, MD      . multivitamin with minerals tablet 1 tablet  1 tablet Oral Daily Houston Siren, MD   1 tablet at 01/06/16 1248  . mupirocin ointment (BACTROBAN) 2 % 1 application  1 application Nasal BID Katha Hamming, MD   1 application at 01/06/16 1248  . nystatin (MYCOSTATIN/NYSTOP) topical powder   Topical TID Houston Siren, MD       . ondansetron [facility]) tablet 4 mg  4 mg Oral Q6H PRN Houston Siren, MD       Or  . ondansetron (ZOFRAN) injection 4 mg  4 mg Intravenous Q6H PRN Houston Siren, MD      . oxyCODONE (Oxy IR/ROXICODONE) immediate release tablet 10-20 mg  10-20 mg Oral Q6H PRN Houston Siren, MD      . pantoprazole (PROTONIX) EC tablet 40 mg  40 mg Oral Daily Houston Siren, MD   40 mg at 01/06/16 1248  . sertraline (ZOLOFT) tablet 100 mg  100 mg Oral Daily Houston Siren, MD   100 mg at 01/06/16 1249  . sodium chloride flush (NS) 0.9 % injection 3 mL  3 mL Intravenous Q12H Houston Siren, MD   3 mL at 01/06/16 1248  . tamsulosin (FLOMAX) capsule 0.4 mg  0.4 mg Oral Daily Houston Siren, MD   0.4 mg at 01/06/16 1248  . traZODone (DESYREL) tablet 50 mg  50 mg Oral QHS Houston Siren, MD   50 mg at 01/05/16 2250     Discharge Medications: Please see discharge summary for a list of discharge medications.  Relevant Imaging Results:  Relevant Lab Results:   Additional Information [id number]  Soundra Pilon, LCSW

## 2016-01-06 NOTE — Clinical Social Work Note (Signed)
Clinical Social Work Assessment  Patient Details  Name: [name] MRN: [id number] Date of Birth: [birth year]  Date of referral:  01/06/16               Reason for consult:   (from [facility])                Permission sought to share information with:  Facility Medical sales representative, Family Supports Permission granted to share information::  Yes, Verbal Permission Granted  Name::     Sister Corrie Dandy  Agency::     Relationship::     Contact Information:     Housing/Transportation Living arrangements for the past 2 months:  Skilled Building surveyor of Information:  Patient, Medical Team Patient Interpreter Needed:  None Criminal Activity/Legal Involvement Pertinent to Current Situation/Hospitalization:    Significant Relationships:  Siblings, Spouse, Merchandiser, retail Lives with:  Facility Resident Do you feel safe going back to the place where you live?  Yes Need for family participation in patient care:  No (Coment)  Care giving concerns:  Care Coordination    Social Worker assessment / plan:  Clinical Social Worker (CSW) consult, patient is from [facility].   Patient was is alert, oriented  and engaged in conversation with CSW.  CSW introduced self and explained role of CSW department. Patient states he has lived at [facility] for the past year and plans to return at discharge. States his wife of 20 years also lives at the facility.  When at the facility patient primarily uses his wheelchair Patient's support system is his sister Corrie Dandy. He has no children.    CSW will complete FL2, and contact facility in anticipation of patient returning to [facility] at discharge.    Employment status:  Retired Health and safety inspector:  Medicaid [place], [employer] PT Recommendations:  Not assessed at this time Information / Referral to community resources:  Skilled Nursing Facility  Patient/Family's Response to care:  Patient was appreciative of talking with  CSW   Patient/Family's Understanding of and Emotional Response to Diagnosis, Current Treatment, and Prognosis:  Patient was pleasant during assessment and understands that he is under continued medical work up at this time.  Once medically stable he will discharge back to [facility].  Emotional Assessment Appearance:  Appears stated age Attitude/Demeanor/Rapport:    Affect (typically observed):  Accepting, Calm, Pleasant Orientation:  Oriented to Self, Oriented to Place, Oriented to  Time Alcohol / Substance use:    Psych involvement (Current and /or in the community):  No (Comment)  Discharge Needs  Concerns to be addressed:  Care Coordination, Discharge Planning Concerns Readmission within the last 30 days:  No Current discharge risk:  Chronically ill, Dependent with Mobility Barriers to Discharge:  Continued Medical Work up, No Barriers Identified   Soundra Pilon, LCSW 01/06/2016, 4:21 PM

## 2016-01-07 ENCOUNTER — Inpatient Hospital Stay: Payer: Medicare Other

## 2016-01-07 LAB — BASIC METABOLIC PANEL
Anion gap: 4 — ABNORMAL LOW (ref 5–15)
BUN: 42 mg/dL — AB (ref 6–20)
CALCIUM: 8.2 mg/dL — AB (ref 8.9–10.3)
CO2: 30 mmol/L (ref 22–32)
CREATININE: 1.61 mg/dL — AB (ref 0.61–1.24)
Chloride: 107 mmol/L (ref 101–111)
GFR calc Af Amer: 48 mL/min — ABNORMAL LOW (ref >60)
GFR, EST NON AFRICAN AMERICAN: 41 mL/min — AB (ref >60)
Glucose, Bld: 101 mg/dL — ABNORMAL HIGH (ref 65–99)
Potassium: 4.5 mmol/L (ref 3.5–5.1)
SODIUM: 141 mmol/L (ref 135–145)

## 2016-01-07 MED ORDER — METOPROLOL TARTRATE 50 MG PO TABS
50.0000 mg | ORAL_TABLET | Freq: Every day | ORAL | Status: DC
  Administered 2016-01-07 – 2016-01-08 (×2): 50 mg via ORAL
  Filled 2016-01-07 (×2): qty 1

## 2016-01-07 MED ORDER — FUROSEMIDE 40 MG PO TABS
60.0000 mg | ORAL_TABLET | Freq: Two times a day (BID) | ORAL | Status: DC
  Administered 2016-01-07 – 2016-01-08 (×2): 60 mg via ORAL
  Filled 2016-01-07 (×2): qty 2

## 2016-01-07 NOTE — Progress Notes (Signed)
Respiratory treatment not given at this time. Attempted however patient began to swear and threaten. Treatment discontinued.

## 2016-01-07 NOTE — Progress Notes (Signed)
[facility] [place] at [facility]   PATIENT NAME: [name]    MR#:  [id number]  DATE OF BIRTH:  1946-05-19  SUBJECTIVE:   Patient is here due to sepsis from UTI. BC + for ESBL.  Afebrile, hemodynamically stable.  Mental status much improved and back to baseline.   REVIEW OF SYSTEMS:    Review of Systems  Constitutional: Negative for fever and chills.  HENT: Negative for congestion and tinnitus.   Eyes: Negative for blurred vision and double vision.  Respiratory: Negative for cough, shortness of breath and wheezing.   Cardiovascular: Negative for chest pain, orthopnea and PND.  Gastrointestinal: Negative for nausea, vomiting, abdominal pain and diarrhea.  Genitourinary: Negative for dysuria and hematuria.  Neurological: Positive for weakness (generalized). Negative for dizziness, sensory change and focal weakness.  All other systems reviewed and are negative.   Nutrition: Dysphagia 1 Tolerating Diet: Yes Tolerating PT: Await Eval.    DRUG ALLERGIES:  No Known Allergies  VITALS:  Blood pressure 139/53, pulse 69, temperature 97.4 F (36.3 C), temperature source Oral, resp. rate 17, height  (1.778 m), weight 128.005 kg (282 lb 3.2 oz), SpO2 98 %.  PHYSICAL EXAMINATION:   Physical Exam  GENERAL:  70 y.o.-year-old obese patient lying in the bed in no acute distress.  EYES: Pupils equal, round, reactive to light and accommodation. No scleral icterus. Extraocular muscles intact.  HEENT: Head atraumatic, normocephalic. Oropharynx and nasopharynx clear.  NECK:  Supple, no jugular venous distention. No thyroid enlargement, no tenderness.  LUNGS: Normal breath sounds bilaterally, no wheezing, rales, rhonchi. No use of accessory muscles of respiration.  CARDIOVASCULAR: S1, S2 normal. No murmurs, rubs, or gallops.  ABDOMEN: Soft, nontender, nondistended. Bowel sounds present. No organomegaly or mass.  EXTREMITIES: No cyanosis, clubbing or _+1-2  edema b/l.   B/l Lower ext. Wrapped in Kerlex/unna boot.   NEUROLOGIC: Cranial nerves II through XII are intact. No focal Motor or sensory deficits b/l.   PSYCHIATRIC: The patient is alert and oriented x 3.  Good affect.   SKIN: No obvious rash, lesion, or ulcer.    LABORATORY PANEL:   CBC  Recent Labs Lab 01/06/16 0728  WBC 4.1  HGB 9.7*  HCT 29.2*  PLT 92*   ------------------------------------------------------------------------------------------------------------------  Chemistries   Recent Labs Lab 01/04/16 1315  01/07/16 0612  NA 140  < > 141  K 4.5  < > 4.5  CL 99*  < > 107  CO2 33*  < > 30  GLUCOSE 122*  < > 101*  BUN 46*  < > 42*  CREATININE 2.22*  < > 1.61*  CALCIUM 8.6*  < > 8.2*  AST 19  --   --   ALT 7*  --   --   ALKPHOS 37*  --   --   BILITOT 0.8  --   --   < > = values in this interval not displayed. ------------------------------------------------------------------------------------------------------------------  Cardiac Enzymes  Recent Labs Lab 01/05/16 0657  TROPONINI 0.09*   ------------------------------------------------------------------------------------------------------------------  RADIOLOGY:  US Renal  01/07/2016  CLINICAL DATA:  UTI. EXAM: RENAL / URINARY TRACT ULTRASOUND COMPLETE COMPARISON:  None. FINDINGS: Right Kidney: Length: 12.2 cm. Echogenicity is within normal limits. No mass or hydronephrosis visualized. Left Kidney: Length: 12.4 cm. Echogenicity within normal limits. No mass or hydronephrosis visualized. Mild cortical thinning. Bladder: Appears normal for degree of bladder distention. IMPRESSION: No hydronephrosis.  No acute findings. Electronically Signed   By: Charlett Nose  M.D.   On: 01/07/2016 14:07     ASSESSMENT AND PLAN:   70 year old male with past medical history of COPD, history of CHF, hypertension, chronic kidney disease stage III, gout, chronic pain, GERD, obstructive sleep apnea, anxiety who presents to the  hospital due to altered mental status and fever.  #1 altered mental status-metabolic encephalopathy due to underlying UTI, sepsis. -Continue IV meropenem. Mental status much improved and back to baseline.  #2 sepsis-this is a urinary tract infection. -Patient's blood and urine cultures are positive for ESBL.   -Continue IV meropenem for now.  Will get ID consult in a.m and discussed w/ Dr. Sampson Goon.  - Renal US (-) for abscess or stone.  -Afebrile and hemodynamically stable.  #3 pneumonia-initially suspected on admission but not ruled out. -Continue IV meropenem. We'll follow sputum cultures. Continue DuoNeb's.  #4 acute respiratory failure with hypoxia-this was due to sepsis, COPD.  Pneumonia ruled out.  -O2 requirements have significantly improved. now off high flow nasal cannula and weaned to just 3L [place] and will monitor.   #5 elevated troponin-likely setting of demand ischemia from underlying sepsis and hypoxemia. -troponin's did not trend up.  No chest pain. Stable.   #6 COPD-no acute exacerbation. Continue DuoNeb's.  #7 GERD-continue Protonix.  #8 chronic pain-continue oxycodone.  #9 depression-continue Zoloft.  #10 BPH-continue Flomax, finasteride.  #11 history of depression/anxiety-continue Depakote.  #12 history of gout-continue allopurinol. No acute attack presently.  #13 chronic kidney disease stage III-creatinine is currently at baseline. Will follow BUN and creatinine  #14 hx of chronic diastolic CHF - will resume Metoprolol, Lasix as BP stable.  - clinically not in CHF presently.    All the records are reviewed and case discussed with Care Management/Social Workerr. Management plans discussed with the patient, family and they are in agreement.  CODE STATUS: DNR  DVT Prophylaxis: Heparin subcutaneous  TOTAL TIME TAKING CARE OF THIS PATIENT: 30 minutes.   POSSIBLE D/C IN 1-2 DAYS, DEPENDING ON CLINICAL CONDITION.   Houston Siren M.D on 01/07/2016 at  2:44 PM  Between 7am to 6pm - Pager - [phone]  After 6pm go to [email or web address] - password [email or web address] [facility]  [place]  [facility]  Office  [phone]  CC: Primary care physician; Dimple Casey, MD

## 2016-01-08 ENCOUNTER — Ambulatory Visit: Admitting: Surgery

## 2016-01-08 MED ORDER — ERTAPENEM SODIUM 1 G IJ SOLR
1.0000 g | Freq: Once | INTRAMUSCULAR | Status: AC
  Administered 2016-01-08: 1 g via INTRAVENOUS
  Filled 2016-01-08: qty 1

## 2016-01-08 MED ORDER — SULFAMETHOXAZOLE-TRIMETHOPRIM 800-160 MG PO TABS: 1.0000 | ORAL_TABLET | Freq: Two times a day (BID) | ORAL | Status: AC

## 2016-01-08 MED ORDER — POLYETHYLENE GLYCOL 3350 17 G PO PACK
17.0000 g | PACK | Freq: Every day | ORAL | Status: DC | PRN
  Filled 2016-01-08: qty 1

## 2016-01-08 MED ORDER — OXYCODONE HCL 5 MG PO TABS
10.0000 mg | ORAL_TABLET | Freq: Four times a day (QID) | ORAL | Status: DC | PRN
Start: 2016-01-08 — End: 2016-03-15

## 2016-01-08 MED ORDER — BISACODYL 10 MG RE SUPP
10.0000 mg | Freq: Once | RECTAL | Status: AC
  Administered 2016-01-08: 10 mg via RECTAL
  Filled 2016-01-08: qty 1

## 2016-01-08 MED ORDER — OXYCODONE HCL 5 MG PO TABS: 10.0000 mg | ORAL_TABLET | Freq: Three times a day (TID) | ORAL | Status: DC | PRN

## 2016-01-08 NOTE — Clinical Social Work Note (Signed)
Patient to discharge today to return to [facility]. Discharge information has been sent to [facility]. Patient's sister has been notified and requests transport via EMS. York Spaniel MSW,LCSW [phone]

## 2016-01-08 NOTE — Discharge Summary (Addendum)
[facility] - [place] at [facility]   PATIENT NAME: [name]    MR#:  [id number]  DATE OF BIRTH:  1946/01/28  DATE OF ADMISSION:  01/04/2016 ADMITTING PHYSICIAN: Houston Siren, MD  DATE OF DISCHARGE: 01/08/2016  PRIMARY CARE PHYSICIAN: Dimple Casey, MD    ADMISSION DIAGNOSIS:  Sepsis, due to unspecified organism (HCC) [A41.9] Urinary tract infection with hematuria, site unspecified [N39.0, R31.9] Pneumonia involving left lung, unspecified part of lung [J18.9]  DISCHARGE DIAGNOSIS:  Active Problems:   Sepsis (HCC)   SECONDARY DIAGNOSIS:   Past Medical History  Diagnosis Date  . Bacteremia   . GERD (gastroesophageal reflux disease)   . Hematuria   . Urinary incontinence   . Sleep apnea     on CPAP  . Hyperlipemia   . 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 COPD (chronic obstructive pulmonary disease) (HCC)   . Hypertension   . Lymphedema   . Anxiety disorder   . CHF (congestive heart failure) (HCC)     diastolic dysfunction, last ECHO from 2015, EF 50-55%  . Muscle weakness   . Morbid obesity (HCC)   . Difficulty in walking     bedbound  . Chronic respiratory failure (HCC)     on 2l oxygen continuous  . Diabetes mellitus (HCC)   . Gout   . CKD (chronic kidney disease)   . Heel ulcer (HCC)   . Urinary retention     HOSPITAL COURSE:   70 year old male with past medical history of COPD, history of CHF, hypertension, chronic kidney disease stage III, gout, chronic pain, GERD, obstructive sleep apnea, anxiety who presents to the hospital due to altered mental status and fever.  #1 altered mental status-this was metabolic encephalopathy due to underlying UTI, sepsis. -Patient was treated with IV antibiotics with IV vancomycin, Zosyn initially and then narrowed down to just meropenem. He was also given IV fluids. After aggressive therapy patient's mental status is much improved and is back to  baseline at discharge today.  #2 sepsis-this was due to urinary tract infection. -Patient's blood and urine cultures are positive for ESBL.  -Patient was initially given IV vancomycin and Zosyn and then narrowed down to just IV meropenem. Discussed the case with Dr. Sampson Goon from infectious disease over the phone and it's okay to discharge patient on oral Bactrim for 2 weeks and follow-up with urology as an outpatient. -Patient did have a renal ultrasound done which showed no evidence of abscess or any nephrolithiasis.  #3 pneumonia-initially suspected on admission but now ruled out.   - no acute respiratory issues presently.   #4 acute respiratory failure with hypoxia-this was due to sepsis, COPD. Pneumonia has ruled out.  -O2 requirements have significantly improved since admission.  He has been weaned off high flow nasal cannula and is currently on 3 L nasal cannula and doing well. He will continue oxygen supplementation at the scars and disorder.   #5 elevated troponin-this was in setting of demand ischemia from sepsis and hypoxemia. His troponins did not trend upwards. He had no acute chest pain.  #6 COPD-patient had no acute exacerbation while in the hospital. -he will resume his DuoNeb's upon discharge.  #7 GERD-He will continue Protonix.  #8 chronic pain-he will continue oxycodone.  #9 depression-he will continue Zoloft.  #10 BPH-he will continue Flomax, finasteride.  #11 history of depression/anxiety-he will continue Depakote.  #12 history of gout-continue allopurinol. No acute attack while  in the hospital.   #13 chronic kidney disease stage III-creatinine is currently at baseline and can be followed further as outpatient.   #14 hx of chronic diastolic CHF - clinically in the hospital patient was not in congestive heart failure. His diuretics and beta blockers were held initially due to sepsis. Now his hemodynamics have improved and he resume his Lasix and metoprolol  upon discharge.  DISCHARGE CONDITIONS:   Stable  CONSULTS OBTAINED:  Treatment Team:  Clydie Braun, MD  DRUG ALLERGIES:  No Known Allergies  DISCHARGE MEDICATIONS:   Current Discharge Medication List    START taking these medications   Details  sulfamethoxazole-trimethoprim (BACTRIM DS,SEPTRA DS) 800-160 MG tablet Take 1 tablet by mouth 2 (two) times daily.      CONTINUE these medications which have CHANGED   Details  !! oxyCODONE (OXY IR/ROXICODONE) 5 MG immediate release tablet Take 2-4 tablets (10-20 mg total) by mouth every 6 (six) hours as needed for severe pain. Qty: 30 tablet, Refills: 0    !! oxyCODONE (OXY IR/ROXICODONE) 5 MG immediate release tablet Take 2 tablets (10 mg total) by mouth 3 (three) times daily as needed for moderate pain or severe pain. Qty: 30 tablet, Refills: 0     !! - Potential duplicate medications found. Please discuss with provider.    CONTINUE these medications which have NOT CHANGED   Details  acetaminophen (TYLENOL) 500 MG tablet Take 500 mg by mouth every 6 (six) hours as needed for mild pain or moderate pain.    allopurinol (ZYLOPRIM) 100 MG tablet Take 100 mg by mouth at bedtime.    collagenase (SANTYL) ointment Apply 1 application topically daily. Pt applies to right heel.    dextromethorphan (DELSYM) 30 MG/5ML liquid Take 60 mg by mouth every 12 (twelve) hours as needed for cough.    divalproex (DEPAKOTE) 250 MG DR tablet Take 250 mg by mouth 2 (two) times daily.    finasteride (PROSCAR) 5 MG tablet Take 1 tablet (5 mg total) by mouth daily. Qty: 30 tablet, Refills: 0    fluconazole (DIFLUCAN) 150 MG tablet Take 150 mg by mouth once a week. Pt takes on Thursday.    furosemide (LASIX) 20 MG tablet Take 60 mg by mouth 2 (two) times daily.    gabapentin (NEURONTIN) 400 MG capsule Take 400 mg by mouth 3 (three) times daily.    guaiFENesin (MUCINEX) 600 MG 12 hr tablet Take 600 mg by mouth every 12 (twelve) hours.     insulin NPH-regular Human (NOVOLIN 70/30) (70-30) 100 UNIT/ML injection Inject 15-25 Units into the skin 2 (two) times daily. Pt uses 15 units in the morning and 25 units at bedtime.    ipratropium-albuterol (DUONEB) 0.5-2.5 (3) MG/3ML SOLN Take 3 mLs by nebulization 2 (two) times daily.    loperamide (IMODIUM) 2 MG capsule Take 2 mg by mouth every 4 (four) hours as needed for diarrhea or loose stools.    loratadine (CLARITIN) 10 MG tablet Take 10 mg by mouth daily.    magnesium hydroxide (MILK OF MAGNESIA) 400 MG/5ML suspension Take 30 mLs by mouth daily as needed for mild constipation.    Menthol, Topical Analgesic, (BIOFREEZE EX) Apply 1 application topically every 12 (twelve) hours as needed (for pain).     metoprolol (LOPRESSOR) 50 MG tablet Take 50 mg by mouth daily.    Multiple Vitamin (MULTIVITAMIN WITH MINERALS) TABS tablet Take 1 tablet by mouth daily.    nystatin (MYCOSTATIN/NYSTOP) 100000 UNIT/GM POWD Apply topically 3 (  three) times daily.    omeprazole (PRILOSEC) 20 MG capsule Take 20 mg by mouth daily.     sertraline (ZOLOFT) 100 MG tablet Take 100 mg by mouth daily.     tamsulosin (FLOMAX) 0.4 MG CAPS capsule Take 0.4 mg by mouth daily.    traZODone (DESYREL) 50 MG tablet Take 50 mg by mouth at bedtime.         DISCHARGE INSTRUCTIONS:   DIET:  Dysphagia III w/ thin liquids. Carb Control/Heart Healthy. Aspiration Precautions.   DISCHARGE CONDITION:  Stable  ACTIVITY:  Activity as tolerated  OXYGEN:  Home Oxygen: Yes.     Oxygen Delivery: 2 liters/min via Patient connected to nasal cannula oxygen  DISCHARGE LOCATION:  nursing home   If you experience worsening of your admission symptoms, develop shortness of breath, life threatening emergency, suicidal or homicidal thoughts you must seek medical attention immediately by calling 911 or calling your MD immediately  if symptoms less severe.  You Must read complete instructions/literature along with all  the possible adverse reactions/side effects for all the Medicines you take and that have been prescribed to you. Take any new Medicines after you have completely understood and accpet all the possible adverse reactions/side effects.   Please note  You were cared for by a hospitalist during your hospital stay. If you have any questions about your discharge medications or the care you received while you were in the hospital after you are discharged, you can call the unit and asked to speak with the hospitalist on call if the hospitalist that took care of you is not available. Once you are discharged, your primary care physician will handle any further medical issues. Please note that NO REFILLS for any discharge medications will be authorized once you are discharged, as it is imperative that you return to your primary care physician (or establish a relationship with a primary care physician if you do not have one) for your aftercare needs so that they can reassess your need for medications and monitor your lab values.     Today   Feels much better and is awake and alert and oriented. No complaints presently.  VITAL SIGNS:  Blood pressure 153/72, pulse 73, temperature 97.7 F (36.5 C), temperature source Oral, resp. rate 15, height  (1.778 m), weight 128.005 kg (282 lb 3.2 oz), SpO2 95 %.  I/O:   Intake/Output Summary (Last 24 hours) at 01/08/16 1034 Last data filed at 01/08/16 0854  Gross per 24 hour  Intake    243 ml  Output   1075 ml  Net   -832 ml    PHYSICAL EXAMINATION:   GENERAL: 70 y.o.-year-old obese patient lying in the bed in no acute distress.  EYES: Pupils equal, round, reactive to light and accommodation. No scleral icterus. Extraocular muscles intact.  HEENT: Head atraumatic, normocephalic. Oropharynx and nasopharynx clear.  NECK: Supple, no jugular venous distention. No thyroid enlargement, no tenderness.  LUNGS: Normal breath sounds bilaterally, no wheezing,  rales, rhonchi. No use of accessory muscles of respiration.  CARDIOVASCULAR: S1, S2 normal. No murmurs, rubs, or gallops.  ABDOMEN: Soft, nontender, nondistended. Bowel sounds present. No organomegaly or mass.  EXTREMITIES: No cyanosis, clubbing or _+1-2 edema b/l. B/l Lower ext. Wrapped in Kerlex/unna boot.  NEUROLOGIC: Cranial nerves II through XII are intact. No focal Motor or sensory deficits b/l.  PSYCHIATRIC: The patient is alert and oriented x 3. Good affect.  SKIN: No obvious rash, lesion, or ulcer.   DATA  REVIEW:   CBC  Recent Labs Lab 01/06/16 0728  WBC 4.1  HGB 9.7*  HCT 29.2*  PLT 92*    Chemistries   Recent Labs Lab 01/04/16 1315  01/07/16 0612  NA 140  < > 141  K 4.5  < > 4.5  CL 99*  < > 107  CO2 33*  < > 30  GLUCOSE 122*  < > 101*  BUN 46*  < > 42*  CREATININE 2.22*  < > 1.61*  CALCIUM 8.6*  < > 8.2*  AST 19  --   --   ALT 7*  --   --   ALKPHOS 37*  --   --   BILITOT 0.8  --   --   < > = values in this interval not displayed.  Cardiac Enzymes  Recent Labs Lab 01/05/16 0657  TROPONINI 0.09*    Microbiology Results  Results for orders placed or performed during the hospital encounter of 01/04/16  Blood Culture (routine x 2)     Status: None (Preliminary result)   Collection Time: 01/04/16  1:15 PM  Result Value Ref Range Status   Specimen Description BLOOD RIGHT ASSIST CONTROL  Final   Special Requests BOTTLES DRAWN AEROBIC AND ANAEROBIC  1CC  Final   Culture  Setup Time   Final    GRAM NEGATIVE RODS IN BOTH AEROBIC AND ANAEROBIC BOTTLES CRITICAL RESULT CALLED TO, READ BACK BY AND VERIFIED WITH: MATTA MCBANE AT 0338 ON 01/05/16 BY VAB    Culture   Final    ESCHERICHIA COLI IN BOTH AEROBIC AND ANAEROBIC BOTTLES ESBL-EXTENDED SPECTRUM BETA LACTAMASE-THE ORGANISM IS RESISTANT TO PENICILLINS, CEPHALOSPORINS AND AZTREONAM ACCORDING TO CLSI M100-S15 VOL.25 N01 JAN 2005. Results Called to: [facility] MILES AT 0810 ON 01/07/16. CTJ     Report Status PENDING  Incomplete   Organism ID, Bacteria ESCHERICHIA COLI  Final      Susceptibility   Escherichia coli - MIC*    AMPICILLIN >=32 RESISTANT Resistant     CEFAZOLIN >=64 RESISTANT Resistant     CEFEPIME 2 RESISTANT Resistant     CEFTAZIDIME 16 RESISTANT Resistant     CEFTRIAXONE >=64 RESISTANT Resistant     CIPROFLOXACIN >=4 RESISTANT Resistant     GENTAMICIN <=1 SENSITIVE Sensitive     IMIPENEM <=0.25 SENSITIVE Sensitive     TRIMETH/SULFA <=20 SENSITIVE Sensitive     AMPICILLIN/SULBACTAM 8 SENSITIVE Sensitive     PIP/TAZO <=4 SENSITIVE Sensitive     Extended ESBL POSITIVE Resistant     * ESCHERICHIA COLI  Rapid Influenza A&B Antigens ([facility] only)     Status: None   Collection Time: 01/04/16  1:15 PM  Result Value Ref Range Status   Influenza A ([facility]) NOT DETECTED  Final   Influenza B ([facility]) NOT DETECTED  Final  Blood Culture ID Panel (Reflexed)     Status: Abnormal   Collection Time: 01/04/16  1:15 PM  Result Value Ref Range Status   Enterococcus species NOT DETECTED NOT DETECTED Final   Vancomycin resistance NOT DETECTED NOT DETECTED Final   Listeria monocytogenes NOT DETECTED NOT DETECTED Final   Staphylococcus species NOT DETECTED NOT DETECTED Final   Staphylococcus aureus NOT DETECTED NOT DETECTED Final   Methicillin resistance NOT DETECTED NOT DETECTED Final   Streptococcus species NOT DETECTED NOT DETECTED Final   Streptococcus agalactiae NOT DETECTED NOT DETECTED Final   Streptococcus pneumoniae NOT DETECTED NOT DETECTED Final   Streptococcus pyogenes NOT DETECTED NOT DETECTED Final  Acinetobacter baumannii NOT DETECTED NOT DETECTED Final   Enterobacteriaceae species NOT DETECTED NOT DETECTED Final   Enterobacter cloacae complex NOT DETECTED NOT DETECTED Corrected    Comment: CORRECTED ON 02/24 AT 0355: PREVIOUSLY REPORTED AS DETECTED CRITICAL RESULT CALLED TO, READ BACK BY AND VERIFIED WITH: MATT MCBANE AT 1610 ON 01/05/16 BY VAB   Escherichia coli  DETECTED (A) NOT DETECTED Corrected    Comment: CRITICAL RESULT CALLED TO, READ BACK BY AND VERIFIED WITH: MATT MCBANE AT 0338 ON 01/05/16 BY VAB CORRECTED ON 02/24 AT 0355: PREVIOUSLY REPORTED AS NOT DETECTED    Klebsiella oxytoca NOT DETECTED NOT DETECTED Final   Klebsiella pneumoniae NOT DETECTED NOT DETECTED Final   Proteus species NOT DETECTED NOT DETECTED Final   Serratia marcescens NOT DETECTED NOT DETECTED Final   Carbapenem resistance NOT DETECTED NOT DETECTED Final   Haemophilus influenzae NOT DETECTED NOT DETECTED Final   Neisseria meningitidis NOT DETECTED NOT DETECTED Final   Pseudomonas aeruginosa NOT DETECTED NOT DETECTED Final   Candida albicans NOT DETECTED NOT DETECTED Final   Candida glabrata NOT DETECTED NOT DETECTED Final   Candida krusei NOT DETECTED NOT DETECTED Final   Candida parapsilosis NOT DETECTED NOT DETECTED Final   Candida tropicalis NOT DETECTED NOT DETECTED Final  Blood Culture (routine x 2)     Status: None (Preliminary result)   Collection Time: 01/04/16  1:25 PM  Result Value Ref Range Status   Specimen Description BLOOD LEFT HAND  Final   Special Requests BOTTLES DRAWN AEROBIC AND ANAEROBIC 1CC  Final   Culture  Setup Time   Final    GRAM NEGATIVE RODS AEROBIC BOTTLE ONLY CRITICAL VALUE NOTED.  VALUE IS CONSISTENT WITH PREVIOUSLY REPORTED AND CALLED VALUE.    Culture   Final    ESCHERICHIA COLI AEROBIC BOTTLE ONLY ESBL-EXTENDED SPECTRUM BETA LACTAMASE-THE ORGANISM IS RESISTANT TO PENICILLINS, CEPHALOSPORINS AND AZTREONAM ACCORDING TO CLSI M100-S15 VOL.25 N01 JAN 2005. CRITICAL VALUE NOTED.  VALUE IS CONSISTENT WITH PREVIOUSLY REPORTED AND CALLED VALUE.    Report Status PENDING  Incomplete   Organism ID, Bacteria ESCHERICHIA COLI  Final      Susceptibility   Escherichia coli - MIC*    AMPICILLIN >=32 RESISTANT Resistant     CEFAZOLIN >=64 RESISTANT Resistant     CEFEPIME 2 RESISTANT Resistant     CEFTAZIDIME 16 RESISTANT Resistant      CEFTRIAXONE >=64 RESISTANT Resistant     CIPROFLOXACIN >=4 RESISTANT Resistant     GENTAMICIN <=1 SENSITIVE Sensitive     IMIPENEM <=0.25 SENSITIVE Sensitive     TRIMETH/SULFA <=20 SENSITIVE Sensitive     AMPICILLIN/SULBACTAM 8 SENSITIVE Sensitive     PIP/TAZO <=4 SENSITIVE Sensitive     Extended ESBL POSITIVE Resistant     * ESCHERICHIA COLI  Urine culture     Status: None   Collection Time: 01/04/16  1:30 PM  Result Value Ref Range Status   Specimen Description URINE, RANDOM  Final   Special Requests NONE  Final   Culture >=100,000 COLONIES/mL ESCHERICHIA COLI  Final   Report Status 01/06/2016 FINAL  Final   Organism ID, Bacteria ESCHERICHIA COLI  Final      Susceptibility   Escherichia coli - MIC*    AMPICILLIN >=32 RESISTANT Resistant     CEFAZOLIN <=4 SENSITIVE Sensitive     CEFTRIAXONE <=1 SENSITIVE Sensitive     CIPROFLOXACIN 1 SENSITIVE Sensitive     GENTAMICIN <=1 SENSITIVE Sensitive     IMIPENEM <=0.25 SENSITIVE Sensitive  NITROFURANTOIN <=16 SENSITIVE Sensitive     TRIMETH/SULFA >=320 RESISTANT Resistant     AMPICILLIN/SULBACTAM >=32 RESISTANT Resistant     PIP/TAZO 8 SENSITIVE Sensitive     Extended ESBL NEGATIVE Sensitive     * >=100,000 COLONIES/mL ESCHERICHIA COLI  MRSA PCR Screening     Status: Abnormal   Collection Time: 01/04/16  8:35 PM  Result Value Ref Range Status   MRSA by PCR POSITIVE (A) NEGATIVE Final    Comment:        The GeneXpert MRSA Assay (FDA approved for NASAL specimens only), is one component of a comprehensive MRSA colonization surveillance program. It is not intended to diagnose MRSA infection nor to guide or monitor treatment for MRSA infections. CRITICAL RESULT CALLED TO, READ BACK BY AND VERIFIED WITH: HIRAL PATEL AT 2311 ON 01/04/16 BY VAB     RADIOLOGY:  US Renal  01/07/2016  CLINICAL DATA:  UTI. EXAM: RENAL / URINARY TRACT ULTRASOUND COMPLETE COMPARISON:  None. FINDINGS: Right Kidney: Length: 12.2 cm. Echogenicity is  within normal limits. No mass or hydronephrosis visualized. Left Kidney: Length: 12.4 cm. Echogenicity within normal limits. No mass or hydronephrosis visualized. Mild cortical thinning. Bladder: Appears normal for degree of bladder distention. IMPRESSION: No hydronephrosis.  No acute findings. Electronically Signed   By: Charlett Nose M.D.   On: 01/07/2016 14:07      Management plans discussed with the patient, family and they are in agreement.  CODE STATUS:     Code Status Orders        Start     Ordered   01/04/16 1720  Do not attempt resuscitation (DNR)   Continuous    Question Answer Comment  In the event of cardiac or respiratory ARREST Do not call a "code blue"   In the event of cardiac or respiratory ARREST Do not perform Intubation, CPR, defibrillation or ACLS   In the event of cardiac or respiratory ARREST Use medication by any route, position, wound care, and other measures to relive pain and suffering. May use oxygen, suction and manual treatment of airway obstruction as needed for comfort.      01/04/16 1719    Code Status History    Date Active Date Inactive Code Status Order ID Comments User Context   09/05/2015  5:29 PM 09/07/2015  5:20 PM DNR [id number]  Enid Baas, MD Inpatient      TOTAL TIME TAKING CARE OF THIS PATIENT: 40 minutes.    Houston Siren M.D on 01/08/2016 at 10:34 AM  Between 7am to 6pm - Pager - [phone]  After 6pm go to [email or web address] - password [email or web address] [name]  [facility]  Office  [phone]  CC: Primary care physician; Dimple Casey, MD

## 2016-01-08 NOTE — Evaluation (Signed)
Clinical/Bedside Swallow Evaluation Patient Details  Name: [name] MRN: [id number] Date of Birth: 1945-12-11  Today's Date: 01/08/2016 Time: SLP Start Time (ACUTE ONLY): 1215 SLP Stop Time (ACUTE ONLY): 1315 SLP Time Calculation (min) (ACUTE ONLY): 60 min  Past Medical History:  Past Medical History  Diagnosis Date  . Bacteremia   . GERD (gastroesophageal reflux disease)   . Hematuria   . Urinary incontinence   . Sleep apnea     on CPAP  . Hyperlipemia   . 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 COPD (chronic obstructive pulmonary disease) (HCC)   . Hypertension   . Lymphedema   . Anxiety disorder   . CHF (congestive heart failure) (HCC)     diastolic dysfunction, last ECHO from 2015, EF 50-55%  . Muscle weakness   . Morbid obesity (HCC)   . Difficulty in walking     bedbound  . Chronic respiratory failure (HCC)     on 2l oxygen continuous  . Diabetes mellitus (HCC)   . Gout   . CKD (chronic kidney disease)   . Heel ulcer (HCC)   . Urinary retention    Past Surgical History:  Past Surgical History  Procedure Laterality Date  . Coronary angioplasty with stent placement    . Hernia repair      hiatal hernia repair  . Coronary artery bypass graft    . Appendectomy     HPI:  Pt is a 70 y.o. male with a known history of anxiety, COPD, hypertension, history of CHF, morbid obesity, chronic kidney disease stage III, diabetes, obstructive sleep apnea, GERD, and other medical issues who presents to the hospital due to fever and altered mental status and noted to be septic. Patient himself is a very poor historian therefore most history obtained from the family at bedside and also the ER physician. As per the family patient was in his usual state of health when this morning he was noted to have a fever of 103 and was more confused than usual. Patient presently denies any cough, shortness of breath, nausea, vomiting abdominal pain  or any other associated symptoms. Due to his altered mental status he was sent to the ER for further evaluation. He was noted to be febrile and also noted to have chest x-ray findings suggestive of pneumonia and also underlying UTI. Pt denies any trouble swallwoing but states he has to "mash" the foods d/t lacking upper dentition, poor lower dentition status. Pt stated the meats at the NH are ground "sometimes". Pt is able to feed self w/ setup. NSG reported no problems when swallowing meds.    Assessment / Plan / Recommendation Clinical Impression  Pt appears at reduced risk for aspiration following general aspiration precautions w/ a Dys. 3 diet consistency; thin liquids. Pt exhibited no overt s/s of aspiration and no decline in respiratory status w/ trials of mech soft foods and thin liquids via cup/straw. Pt was able to feed self w/ setup assistance. During the oral phase, pt requires min. increased time for bolus mashing/gumming d/t lacking sufficient dentition for mastication. Pt would benefit from soft, cooked, moist foods. Rec. a modified diet of Dys. 3 w/ thin liquids; aspiration precautions; meds in puree if easier for swalllowing; tray setup at meals.    Aspiration Risk   (reduced)    Diet Recommendation  Mech soft diet(Dys. 3) w/ thin liquids; aspiration and Reflux precautions; tray setup and positioning as nec.  Medication Administration: Whole meds with puree (if nec.)    Other  Recommendations Recommended Consults:  (dietician) Oral Care Recommendations: Oral care BID;Patient independent with oral care   Follow up Recommendations  None    Frequency and Duration            Prognosis Prognosis for Safe Diet Advancement: Good      Swallow Study   General Date of Onset: 01/04/16 HPI: Pt is a 70 y.o. male with a known history of anxiety, COPD, hypertension, history of CHF, morbid obesity, chronic kidney disease stage III, diabetes, obstructive sleep apnea, GERD, and other  medical issues who presents to the hospital due to fever and altered mental status and noted to be septic. Patient himself is a very poor historian therefore most history obtained from the family at bedside and also the ER physician. As per the family patient was in his usual state of health when this morning he was noted to have a fever of 103 and was more confused than usual. Patient presently denies any cough, shortness of breath, nausea, vomiting abdominal pain or any other associated symptoms. Due to his altered mental status he was sent to the ER for further evaluation. He was noted to be febrile and also noted to have chest x-ray findings suggestive of pneumonia and also underlying UTI. Pt denies any trouble swallwoing but states he has to "mash" the foods d/t lacking upper dentition, poor lower dentition status. Pt stated the meats at the NH are ground "sometimes". Pt is able to feed self w/ setup. NSG reported no problems when swallowing meds.  Type of Study: Bedside Swallow Evaluation Previous Swallow Assessment: none indicated Diet Prior to this Study: Dysphagia 3 (soft);Thin liquids ((?); a pureed diet while admitted) Temperature Spikes Noted: No (wbc not elevated) Respiratory Status: Nasal cannula (3 liters) History of Recent Intubation: No Behavior/Cognition: Alert;Cooperative;Pleasant mood Oral Cavity Assessment: Within Functional Limits Oral Care Completed by SLP: Recent completion by staff Oral Cavity - Dentition: Poor condition;Missing dentition Vision: Functional for self-feeding Self-Feeding Abilities: Able to feed self;Needs set up Patient Positioning: Upright in bed Baseline Vocal Quality: Normal Volitional Cough: Strong Volitional Swallow: Able to elicit    Oral/Motor/Sensory Function Overall Oral Motor/Sensory Function: Within functional limits   Ice Chips Ice chips: Not tested   Thin Liquid Thin Liquid: Within functional limits Presentation: Cup;Self Fed;Straw Other  Comments: ~3 ozs    Nectar Thick Nectar Thick Liquid: Not tested   Honey Thick Honey Thick Liquid: Not tested   Puree Puree: Not tested   Solid   GO   Solid: Within functional limits Presentation: Self Fed;Spoon (6 trials) Other Comments: was not hungry d/t eating a recent breakfast meal(regular consistency) - pt and NSG denied any oropharyngeal phase dysphagia w/ that diet/meal         Jerilynn Som, MS, CCC-SLP  Watson,Katherine 01/08/2016,2:47 PM

## 2016-01-08 NOTE — Care Management Important Message (Signed)
Important Message  Patient Details  Name: [name] MRN: [id number] Date of Birth: August 20, 1946   Medicare Important Message Given:  Yes    Chapman Fitch, RN 01/08/2016, 9:46 AM

## 2016-01-08 NOTE — Progress Notes (Signed)
Spoke with Vikki Ports, [facility] rep at [phone], to notify of non-emergent EMS transport.  Auth notification reference given as [id number].   Service date range good from 01/08/16 - 04/07/16.   Gap exception requested to determine if services can be considered at an in-network level.

## 2016-01-08 NOTE — Progress Notes (Signed)
Report called to Lelon Mast, at [employer]. Pt alert and oriented. IV sites removed. Concerns addressed. EMS notified. Continue to assess.

## 2016-01-08 NOTE — Progress Notes (Signed)
Pt picked up with EMS. Concerns addressed. Pt belongings sent with pt.

## 2016-01-08 NOTE — Progress Notes (Signed)
ID E note Discussed the case.  Patient has sepsis with an ESBL E. coli from urinary source.  Defervesced and stabilized. S/P 3 days of IV therapy.  The organism is sensitive to Bactrim.  Would given a dose of ertapenem in place of meropenem today and then recommend DC on oral Bactrim 1 double strength twice a day (start tomorrow) for total antibiotic course of 14 days.  If he has a history of recurrent urinary tract infections he should follow-up with urology.  I do not see need to see as an outpatient

## 2016-01-09 LAB — CULTURE, BLOOD (ROUTINE X 2)

## 2016-01-19 ENCOUNTER — Encounter: Payer: Medicare Other | Attending: Surgery | Admitting: Surgery

## 2016-01-19 DIAGNOSIS — G4730 Sleep apnea, unspecified: Secondary | ICD-10-CM | POA: Insufficient documentation

## 2016-01-19 DIAGNOSIS — E11621 Type 2 diabetes mellitus with foot ulcer: Secondary | ICD-10-CM | POA: Diagnosis present

## 2016-01-19 DIAGNOSIS — I2510 Atherosclerotic heart disease of native coronary artery without angina pectoris: Secondary | ICD-10-CM | POA: Diagnosis not present

## 2016-01-19 DIAGNOSIS — I129 Hypertensive chronic kidney disease with stage 1 through stage 4 chronic kidney disease, or unspecified chronic kidney disease: Secondary | ICD-10-CM | POA: Insufficient documentation

## 2016-01-19 DIAGNOSIS — I110 Hypertensive heart disease with heart failure: Secondary | ICD-10-CM | POA: Insufficient documentation

## 2016-01-19 DIAGNOSIS — L89613 Pressure ulcer of right heel, stage 3: Secondary | ICD-10-CM | POA: Diagnosis not present

## 2016-01-19 DIAGNOSIS — M1990 Unspecified osteoarthritis, unspecified site: Secondary | ICD-10-CM | POA: Diagnosis not present

## 2016-01-19 DIAGNOSIS — I890 Lymphedema, not elsewhere classified: Secondary | ICD-10-CM | POA: Diagnosis not present

## 2016-01-19 DIAGNOSIS — E785 Hyperlipidemia, unspecified: Secondary | ICD-10-CM | POA: Diagnosis not present

## 2016-01-19 DIAGNOSIS — M109 Gout, unspecified: Secondary | ICD-10-CM | POA: Diagnosis not present

## 2016-01-19 DIAGNOSIS — F329 Major depressive disorder, single episode, unspecified: Secondary | ICD-10-CM | POA: Insufficient documentation

## 2016-01-19 DIAGNOSIS — J449 Chronic obstructive pulmonary disease, unspecified: Secondary | ICD-10-CM | POA: Insufficient documentation

## 2016-01-19 DIAGNOSIS — I5032 Chronic diastolic (congestive) heart failure: Secondary | ICD-10-CM | POA: Insufficient documentation

## 2016-01-19 DIAGNOSIS — E1122 Type 2 diabetes mellitus with diabetic chronic kidney disease: Secondary | ICD-10-CM | POA: Diagnosis not present

## 2016-01-19 DIAGNOSIS — K219 Gastro-esophageal reflux disease without esophagitis: Secondary | ICD-10-CM | POA: Insufficient documentation

## 2016-01-19 DIAGNOSIS — E1140 Type 2 diabetes mellitus with diabetic neuropathy, unspecified: Secondary | ICD-10-CM | POA: Insufficient documentation

## 2016-01-19 DIAGNOSIS — L89322 Pressure ulcer of left buttock, stage 2: Secondary | ICD-10-CM | POA: Diagnosis not present

## 2016-01-19 DIAGNOSIS — Z87891 Personal history of nicotine dependence: Secondary | ICD-10-CM | POA: Diagnosis not present

## 2016-01-19 DIAGNOSIS — N189 Chronic kidney disease, unspecified: Secondary | ICD-10-CM | POA: Insufficient documentation

## 2016-01-20 NOTE — Progress Notes (Signed)
[name], [name] ([id number]) Visit Report for 01/19/2016 Chief Complaint Document Details Patient Name: [name], [name] Date of Service: 01/19/2016 8:45 AM Medical Record Number: [id number] Patient Account Number: [id number] Date of Birth/Sex: Apr 02, 1946 [age] y.o. Male) Treating RN: Phillis HaggisPinkerton, Debi Primary Care Physician: Oretha MilchSMITH, [name] Other Clinician: Referring Physician: Oretha MilchSMITH, [name] Treating Physician/Extender: Rudene ReBritto, Riese Hellard Weeks in Treatment: 2 Information Obtained from: Patient Chief Complaint Patient is at the clinic for treatment of an open pressure ulcer the left upper thigh and gluteal region and the right heel with bilateral swelling of the legs all of it which is going on for over a year Electronic Signature(s) Signed: 01/19/2016 10:00:18 AM By: Evlyn KannerBritto, Johnthan Axtman MD, FACS Entered By: Evlyn KannerBritto, Coleen Cardiff on 01/19/2016 10:00:17 [name], [name] ([id number]) -------------------------------------------------------------------------------- HPI Details Patient Name: [name], [name] Date of Service: 01/19/2016 8:45 AM Medical Record Number: [id number] Patient Account Number: [id number] Date of Birth/Sex: Apr 02, 1946 [age] y.o. Male) Treating RN: Phillis HaggisPinkerton, Debi Primary Care Physician: Oretha MilchSMITH, [name] Other Clinician: Referring Physician: Oretha MilchSMITH, [name] Treating Physician/Extender: Rudene ReBritto, Shuna Tabor Weeks in Treatment: 2 History of Present Illness Location: ulcerated area on the right heel, left gluteal region and thigh and then bilateral lower extremities Quality: Patient reports experiencing a sharp pain to affected area(s). Severity: Patient states wound are getting worse. Duration: Patient has had the wound for > 12 months prior to seeking treatment at the wound center Timing: Pain in wound is constant (hurts all the time) Context: The wound appeared gradually over time Modifying Factors: Other treatment(s) tried include: he sees his heart doctor and his primary care doctor Associated Signs and  Symptoms: patient has not been able to walk for over a year now HPI Description: 70 year old gentleman with a known history of hypertension, diabetes, obstructive sleep apnea, COPD, diastolic CHF, coronary artery disease was admitted to the hospital with sepsis from an ulcer of the right heel and was treated there in October 2016. he also has chronic bilateral lower extremity edema and lymphedema. He had received vancomycin, Zosyn and at that stage and x-rays showed hardware in the right ankle but no evidence of osteomyelitis. he was a former smoker. he was also treated with Augmentin orally for 10 days. He is either bedbound or wheelchair-bound and does not ambulate by himself. 01/19/2016 -- he has not been seen here for 3 weeks and this was because he was admitted to [facility] between 223 and 01/08/2016 for sepsis, UTI and pneumonia involving the left lung. he was treated with IV vancomycin and Zosyn and then meropenem. Was discharged home on oral Bactrim for 2 weeks none of his vascular test or x-rays were done and we will reorder these. Electronic Signature(s) Signed: 01/19/2016 10:02:59 AM By: Evlyn KannerBritto, Sinthia Karabin MD, FACS Entered By: Evlyn KannerBritto, Caedin Mogan on 01/19/2016 10:02:59 [name], [name] ([id number]) -------------------------------------------------------------------------------- Physical Exam Details Patient Name: [name], [name] Date of Service: 01/19/2016 8:45 AM Medical Record Number: [id number] Patient Account Number: [id number] Date of Birth/Sex: Apr 02, 1946 [age] y.o. Male) Treating RN: Phillis HaggisPinkerton, Debi Primary Care Physician: Oretha MilchSMITH, [name] Other Clinician: Referring Physician: Oretha MilchSMITH, [name] Treating Physician/Extender: Rudene ReBritto, Angelus Hoopes Weeks in Treatment: 2 Constitutional . Pulse regular. Respirations normal and unlabored. Afebrile. . Eyes Nonicteric. Reactive to light. Ears, Nose, Mouth, and Throat Lips, teeth, and gums WNL.Marland Kitchen. Moist mucosa without  lesions. Neck supple and nontender. No palpable supraclavicular or cervical adenopathy. Normal sized without goiter. Respiratory WNL. No retractions.. Breath sounds WNL, No rubs, rales, rhonchi, or wheeze.. Cardiovascular Heart rhythm and rate regular, no murmur or gallop.. Pedal  Pulses WNL. No clubbing, cyanosis or edema. Chest Breasts symmetical and no nipple discharge.. Breast tissue WNL, no masses, lumps, or tenderness.. Lymphatic No adneopathy. No adenopathy. No adenopathy. Musculoskeletal Adexa without tenderness or enlargement.. Digits and nails w/o clubbing, cyanosis, infection, petechiae, ischemia, or inflammatory conditions.. Integumentary (Hair, Skin) No suspicious lesions. No crepitus or fluctuance. No peri-wound warmth or erythema. No masses.Marland Kitchen Psychiatric Judgement and insight Intact.. No evidence of depression, anxiety, or agitation.. Notes the right heel continues to have a stage III pressure ulcer which is extremely tender and I am not able to debride this. He has got a new pressure injury to the right lateral foot which is unstageable. The bilateral lower extremity lymphedema and ulceration is looking much better and no debridement was required. The ulceration near the gluteal area on the left thigh have completely healed. Electronic Signature(s) Signed: 01/19/2016 10:03:45 AM By: Evlyn Kanner MD, FACS Entered By: Evlyn Kanner on 01/19/2016 10:03:45 [name] ([id number]) -------------------------------------------------------------------------------- Physician Orders Details Patient Name: [name] Date of Service: 01/19/2016 8:45 AM Medical Record Number: [id number] Patient Account Number: [id number] Date of Birth/Sex: [birth year] (70 y.o. Male) Treating RN: Phillis Haggis Primary Care Physician: Oretha Milch Other Clinician: Referring Physician: Oretha Milch Treating Physician/Extender: Rudene Re in Treatment: 2 Verbal / Phone Orders:  Yes ClinicianAshok Cordia, Debi Read Back and Verified: Yes Diagnosis Coding Wound Cleansing Wound #1 Left Lower Leg o May shower with protection. - please cover wraps and keep dry o No tub bath. Wound #2 Right Lower Leg o May shower with protection. - please cover wraps and keep dry o No tub bath. Wound #3 Right Calcaneus o Cleanse wound with mild soap and water o May Shower, gently pat wound dry prior to applying new dressing. o May shower with protection. - please cover wraps and keep dry o No tub bath. Anesthetic Wound #1 Left Lower Leg o Topical Lidocaine 4% cream applied to wound bed prior to debridement Wound #2 Right Lower Leg o Topical Lidocaine 4% cream applied to wound bed prior to debridement Wound #3 Right Calcaneus o Topical Lidocaine 4% cream applied to wound bed prior to debridement Skin Barriers/Peri-Wound Care Wound #3 Right Calcaneus o Skin Prep Primary Wound Dressing Wound #1 Left Lower Leg o Aquacel Ag Wound #2 Right Lower Leg o Aquacel Ag Wound #3 Right Calcaneus [name], [name] ([id number]) o Santyl Ointment Secondary Dressing Wound #1 Left Lower Leg o ABD pad Wound #2 Right Lower Leg o ABD pad Wound #3 Right Calcaneus o Gauze, ABD and Kerlix/Conform o Foam Dressing Change Frequency Wound #1 Left Lower Leg o Change dressing every week - in clinic Wound #2 Right Lower Leg o Change dressing every week - in clinic Wound #3 Right Calcaneus o Change dressing every day. - daily Follow-up Appointments Wound #1 Left Lower Leg o Return Appointment in 1 week. Wound #2 Right Lower Leg o Return Appointment in 1 week. Wound #3 Right Calcaneus o Return Appointment in 1 week. Edema Control Wound #1 Left Lower Leg o 3 Layer Compression System - Bilateral o Elevate legs to the level of the heart and pump ankles as often as possible Wound #2 Right Lower Leg o 3 Layer Compression System -  Bilateral o Elevate legs to the level of the heart and pump ankles as often as possible Wound #3 Right Calcaneus o Elevate legs to the level of the heart and pump ankles as often as possible Off-Loading Wound #1 Left Lower Leg Furlough, Jasiel  VMarland Kitchen ([id number]) o Turn and reposition every 2 hours o Other: - sage boots Wound #2 Right Lower Leg o Turn and reposition every 2 hours o Other: - sage boots Wound #3 Right Calcaneus o Turn and reposition every 2 hours o Other: - sage boots Electronic Signature(s) Signed: 01/19/2016 3:47:35 PM By: Evlyn Kanner MD, FACS Signed: 01/19/2016 7:01:21 PM By: Alejandro Mulling Entered By: Alejandro Mulling on 01/19/2016 10:02:37 [name] ([id number]) -------------------------------------------------------------------------------- Problem List Details Patient Name: [name] Date of Service: 01/19/2016 8:45 AM Medical Record Number: [id number] Patient Account Number: [id number] Date of Birth/Sex: 1946/10/30 (70 y.o. Male) Treating RN: Phillis Haggis Primary Care Physician: Oretha Milch Other Clinician: Referring Physician: Oretha Milch Treating Physician/Extender: Rudene Re in Treatment: 2 Active Problems ICD-10 Encounter Code Description Active Date Diagnosis E11.621 Type 2 diabetes mellitus with foot ulcer 01/01/2016 Yes L89.613 Pressure ulcer of right heel, stage 3 01/01/2016 Yes E66.01 Morbid (severe) obesity due to excess calories 01/01/2016 Yes I89.0 Lymphedema, not elsewhere classified 01/01/2016 Yes M70.871 Other soft tissue disorders related to use, overuse and 01/19/2016 Yes pressure, right ankle and foot Inactive Problems Resolved Problems ICD-10 Code Description Active Date Resolved Date L89.322 Pressure ulcer of left buttock, stage 2 01/01/2016 01/01/2016 Electronic Signature(s) Signed: 01/19/2016 10:00:09 AM By: Evlyn Kanner MD, FACS Entered By: Evlyn Kanner on 01/19/2016 10:00:08 [name] ([id number]) -------------------------------------------------------------------------------- Progress Note Details Patient Name: [name] Date of Service: 01/19/2016 8:45 AM Medical Record Number: [id number] Patient Account Number: [id number] Date of Birth/Sex: 1946-10-12 (70 y.o. Male) Treating RN: Phillis Haggis Primary Care Physician: Oretha Milch Other Clinician: Referring Physician: Oretha Milch Treating Physician/Extender: Rudene Re in Treatment: 2 Subjective Chief Complaint Information obtained from Patient Patient is at the clinic for treatment of an open pressure ulcer the left upper thigh and gluteal region and the right heel with bilateral swelling of the legs all of it which is going on for over a year History of Present Illness (HPI) The following HPI elements were documented for the patient's wound: Location: ulcerated area on the right heel, left gluteal region and thigh and then bilateral lower extremities Quality: Patient reports experiencing a sharp pain to affected area(s). Severity: Patient states wound are getting worse. Duration: Patient has had the wound for > 12 months prior to seeking treatment at the wound center Timing: Pain in wound is constant (hurts all the time) Context: The wound appeared gradually over time Modifying Factors: Other treatment(s) tried include: he sees his heart doctor and his primary care doctor Associated Signs and Symptoms: patient has not been able to walk for over a year now 70 year old gentleman with a known history of hypertension, diabetes, obstructive sleep apnea, COPD, diastolic CHF, coronary artery disease was admitted to the hospital with sepsis from an ulcer of the right heel and was treated there in October 2016. he also has chronic bilateral lower extremity edema and lymphedema. He had received vancomycin, Zosyn and at that stage and x-rays showed hardware in the right ankle but no evidence of  osteomyelitis. he was a former smoker. he was also treated with Augmentin orally for 10 days. He is either bedbound or wheelchair-bound and does not ambulate by himself. 01/19/2016 -- he has not been seen here for 3 weeks and this was because he was admitted to [facility] between 223 and 01/08/2016 for sepsis, UTI and pneumonia involving the left lung. he was treated with IV vancomycin and Zosyn and then meropenem. Was discharged  home on oral Bactrim for 2 weeks none of his vascular test or x-rays were done and we will reorder these. Objective Constitutional [name], [name] ([id number]) Pulse regular. Respirations normal and unlabored. Afebrile. Vitals Time Taken: 9:06 AM, Height: 70 in, Weight: 235 lbs, BMI: 33.7, Temperature: 97.8 F, Pulse: 79 bpm, Respiratory Rate: 20 breaths/min, Blood Pressure: 129/62 mmHg. Eyes Nonicteric. Reactive to light. Ears, Nose, Mouth, and Throat Lips, teeth, and gums WNL.Marland Kitchen Moist mucosa without lesions. Neck supple and nontender. No palpable supraclavicular or cervical adenopathy. Normal sized without goiter. Respiratory WNL. No retractions.. Breath sounds WNL, No rubs, rales, rhonchi, or wheeze.. Cardiovascular Heart rhythm and rate regular, no murmur or gallop.. Pedal Pulses WNL. No clubbing, cyanosis or edema. Chest Breasts symmetical and no nipple discharge.. Breast tissue WNL, no masses, lumps, or tenderness.. Lymphatic No adneopathy. No adenopathy. No adenopathy. Musculoskeletal Adexa without tenderness or enlargement.. Digits and nails w/o clubbing, cyanosis, infection, petechiae, ischemia, or inflammatory conditions.Marland Kitchen Psychiatric Judgement and insight Intact.. No evidence of depression, anxiety, or agitation.. General Notes: the right heel continues to have a stage III pressure ulcer which is extremely tender and I am not able to debride this. He has got a new pressure injury to the right lateral foot which is  unstageable. The bilateral lower extremity lymphedema and ulceration is looking much better and no debridement was required. The ulceration near the gluteal area on the left thigh have completely healed. Integumentary (Hair, Skin) No suspicious lesions. No crepitus or fluctuance. No peri-wound warmth or erythema. No masses.. Wound #1 status is Open. Original cause of wound was Gradually Appeared. The wound is located on the Left Lower Leg. The wound measures 9cm length x 4cm width x 0.1cm depth; 28.274cm^2 area and 2.827cm^3 volume. The wound is limited to skin breakdown. There is no tunneling or undermining noted. There is a large amount of serosanguineous drainage noted. The wound margin is flat and intact. There is medium (34-66%) red, pink granulation within the wound bed. There is a medium (34-66%) amount of necrotic tissue within the wound bed including Adherent Slough. The periwound skin appearance exhibited: Localized Edema, Moist, Erythema. The surrounding wound skin color is noted with erythema which is circumferential. Periwound temperature was noted as No Abnormality. The periwound has tenderness on Oliff, Demond V. ([id number]) palpation. Wound #2 status is Open. Original cause of wound was Gradually Appeared. The wound is located on the Right Lower Leg. The wound measures 6cm length x 11cm width x 0.1cm depth; 51.836cm^2 area and 5.184cm^3 volume. The wound is limited to skin breakdown. There is no tunneling or undermining noted. There is a large amount of serosanguineous drainage noted. The wound margin is flat and intact. There is small (1-33%) red, pink granulation within the wound bed. There is a large (67-100%) amount of necrotic tissue within the wound bed including Adherent Slough. The periwound skin appearance exhibited: Localized Edema, Moist, Erythema. The surrounding wound skin color is noted with erythema which is circumferential. Periwound temperature was noted as No  Abnormality. The periwound has tenderness on palpation. Wound #3 status is Open. Original cause of wound was Pressure Injury. The wound is located on the Right Calcaneus. The wound measures 3.1cm length x 3.5cm width x 0.2cm depth; 8.522cm^2 area and 1.704cm^3 volume. The wound is limited to skin breakdown. There is no tunneling or undermining noted. There is a large amount of serosanguineous drainage noted. The wound margin is flat and intact. There is small (1-33%) red, pink granulation within the  wound bed. There is a large (67-100%) amount of necrotic tissue within the wound bed including Adherent Slough. The periwound skin appearance exhibited: Localized Edema, Maceration, Moist, Erythema. The surrounding wound skin color is noted with erythema which is circumferential. Periwound temperature was noted as No Abnormality. The periwound has tenderness on palpation. Wound #4 status is Open. Original cause of wound was Pressure Injury. The wound is located on the Left,Distal Gluteal fold. The wound measures 0cm length x 0cm width x 0cm depth; 0cm^2 area and 0cm^3 volume. Assessment Active Problems ICD-10 E11.621 - Type 2 diabetes mellitus with foot ulcer L89.613 - Pressure ulcer of right heel, stage 3 E66.01 - Morbid (severe) obesity due to excess calories I89.0 - Lymphedema, not elsewhere classified M70.871 - Other soft tissue disorders related to use, overuse and pressure, right ankle and foot Plan Wound Cleansing: Wound #1 Left Lower Leg: May shower with protection. - please cover wraps and keep dry Grajeda, [name] ([id number]) No tub bath. Wound #2 Right Lower Leg: May shower with protection. - please cover wraps and keep dry No tub bath. Wound #3 Right Calcaneus: Cleanse wound with mild soap and water May Shower, gently pat wound dry prior to applying new dressing. May shower with protection. - please cover wraps and keep dry No tub bath. Anesthetic: Wound #1 Left Lower  Leg: Topical Lidocaine 4% cream applied to wound bed prior to debridement Wound #2 Right Lower Leg: Topical Lidocaine 4% cream applied to wound bed prior to debridement Wound #3 Right Calcaneus: Topical Lidocaine 4% cream applied to wound bed prior to debridement Skin Barriers/Peri-Wound Care: Wound #3 Right Calcaneus: Skin Prep Primary Wound Dressing: Wound #1 Left Lower Leg: Aquacel Ag Wound #2 Right Lower Leg: Aquacel Ag Wound #3 Right Calcaneus: Santyl Ointment Secondary Dressing: Wound #1 Left Lower Leg: ABD pad Wound #2 Right Lower Leg: ABD pad Wound #3 Right Calcaneus: Gauze, ABD and Kerlix/Conform Foam Dressing Change Frequency: Wound #1 Left Lower Leg: Change dressing every week - in clinic Wound #2 Right Lower Leg: Change dressing every week - in clinic Wound #3 Right Calcaneus: Change dressing every day. - daily Follow-up Appointments: Wound #1 Left Lower Leg: Return Appointment in 1 week. Wound #2 Right Lower Leg: Return Appointment in 1 week. Wound #3 Right Calcaneus: Return Appointment in 1 week. Edema Control: Wound #1 Left Lower Leg: 3 Layer Compression System - Bilateral Gohr, [name] ([id number]) Elevate legs to the level of the heart and pump ankles as often as possible Wound #2 Right Lower Leg: 3 Layer Compression System - Bilateral Elevate legs to the level of the heart and pump ankles as often as possible Wound #3 Right Calcaneus: Elevate legs to the level of the heart and pump ankles as often as possible Off-Loading: Wound #1 Left Lower Leg: Turn and reposition every 2 hours Other: - sage boots Wound #2 Right Lower Leg: Turn and reposition every 2 hours Other: - sage boots Wound #3 Right Calcaneus: Turn and reposition every 2 hours Other: - sage boots 71 year old diabetic who is also morbidly obese and bedbound has stage III pressure injury to the right heel and a indeterminant injury to the right lateral foot. Also due to chronic  lymphedema he has bilateral excoriation and ulceration of both lower extremities. Details of his recent admission to the hospital have been noted and he is on oral antibiotics at the present time. I have recommended: 1. Santyl ointment locally to his right heel. the areas to tender to  debride and once we know his arterial studies he may need a debridement under anesthesia 2. Constant off loading and also using Sage boot. 3. x-ray of the right heel 4. Bilateral arterial duplex studies of both lower extremities. 5. bilateral lower extremity Profore lites which he has been using before 6. High-protein diet and multivitamins including vitamin C and zinc. 7. Check with his skilled nursing facility the type of mattress he is using and make appropriate changes as required. 8. See him regularly had the wound center Electronic Signature(s) Signed: 01/19/2016 10:05:34 AM By: Evlyn Kanner MD, FACS Entered By: Evlyn Kanner on 01/19/2016 10:05:34 [name] ([id number]) -------------------------------------------------------------------------------- SuperBill Details Patient Name: [name] Date of Service: 01/19/2016 Medical Record Number: [id number] Patient Account Number: [id number] Date of Birth/Sex: February 13, 1946 (70 y.o. Male) Treating RN: Phillis Haggis Primary Care Physician: Oretha Milch Other Clinician: Referring Physician: Oretha Milch Treating Physician/Extender: Rudene Re in Treatment: 2 Diagnosis Coding ICD-10 Codes Code Description E11.621 Type 2 diabetes mellitus with foot ulcer L89.613 Pressure ulcer of right heel, stage 3 E66.01 Morbid (severe) obesity due to excess calories I89.0 Lymphedema, not elsewhere classified M70.871 Other soft tissue disorders related to use, overuse and pressure, right ankle and foot Facility Procedures CPT4: Description Modifier Quantity Code [id number] BILATERAL: Application of multi-layer venous compression 1 system; leg  (below knee), including ankle and foot. Physician Procedures CPT4 Code: [id number] Description: 99213 - WC PHYS LEVEL 3 - EST PT ICD-10 Description Diagnosis E11.621 Type 2 diabetes mellitus with foot ulcer L89.613 Pressure ulcer of right heel, stage 3 E66.01 Morbid (severe) obesity due to excess calories I89.0 Lymphedema, not  elsewhere classified Modifier: Quantity: 1 Electronic Signature(s) Signed: 01/19/2016 7:01:21 PM By: Alejandro Mulling Previous Signature: 01/19/2016 10:05:52 AM Version By: Evlyn Kanner MD, FACS Entered By: Alejandro Mulling on 01/19/2016 17:31:59

## 2016-01-20 NOTE — Progress Notes (Signed)
[name], [name] ([id number]) Visit Report for 01/19/2016 Arrival Information Details Patient Name: [name], [name] Date of Service: 01/19/2016 8:45 AM Medical Record Number: [id number] Patient Account Number: [id number] Date of Birth/Sex: [birth year] (70 y.o. Male) Treating RN: Phillis Haggis Primary Care Physician: Oretha Milch Other Clinician: Referring Physician: Oretha Milch Treating Physician/Extender: Rudene Re in Treatment: 2 Visit Information History Since Last Visit All ordered tests and consults were completed: No Patient Arrived: Wheel Chair Added or deleted any medications: No Arrival Time: 09:04 Any new allergies or adverse reactions: No Accompanied By: self Had a fall or experienced change in No Transfer Assistance: Easy[employer] activities of daily living that may affect Patient Lift risk of falls: Patient Identification Verified: Yes Signs or symptoms of abuse/neglect since last No Secondary Verification Process Yes visito Completed: Hospitalized since last visit: No Patient Requires Transmission- No Pain Present Now: No Based Precautions: Patient Has Alerts: Yes Patient Alerts: DM II Electronic Signature(s) Signed: 01/19/2016 7:01:21 PM By: Alejandro Mulling Entered By: Alejandro Mulling on 01/19/2016 09:06:03 [name] ([id number]) -------------------------------------------------------------------------------- Encounter Discharge Information Details Patient Name: [name] Date of Service: 01/19/2016 8:45 AM Medical Record Number: [id number] Patient Account Number: [id number] Date of Birth/Sex: 08-25-1946 (70 y.o. Male) Treating RN: Phillis Haggis Primary Care Physician: Oretha Milch Other Clinician: Referring Physician: Oretha Milch Treating Physician/Extender: Rudene Re in Treatment: 2 Encounter Discharge Information Items Discharge Pain Level: 0 Discharge Condition: Stable Ambulatory Status: Wheelchair Discharge  Destination: Nursing Home Transportation: Other Accompanied By: self Schedule Follow-up Appointment: Yes Medication Reconciliation completed and provided to Patient/Care Yes Tycen Dockter: Provided on Clinical Summary of Care: 01/19/2016 Form Type Recipient Paper Patient EL Electronic Signature(s) Signed: 01/19/2016 10:24:30 AM By: Francie Massing Entered By: Francie Massing on 01/19/2016 10:24:30 [name] ([id number]) -------------------------------------------------------------------------------- Lower Extremity Assessment Details Patient Name: [name] Date of Service: 01/19/2016 8:45 AM Medical Record Number: [id number] Patient Account Number: [id number] Date of Birth/Sex: January 26, 1946 (70 y.o. Male) Treating RN: Phillis Haggis Primary Care Physician: Oretha Milch Other Clinician: Referring Physician: Oretha Milch Treating Physician/Extender: Rudene Re in Treatment: 2 Edema Assessment Assessed: [Left: No] [Right: No] Edema: [Left: Yes] [Right: Yes] Calf Left: Right: Point of Measurement: cm From Medial Instep 37.2 cm 33 cm Ankle Left: Right: Point of Measurement: cm From Medial Instep 25 cm 24.2 cm Vascular Assessment Pulses: Posterior Tibial Extremity colors, hair growth, and conditions: Extremity Color: [Left:Hyperpigmented] [Right:Hyperpigmented] Temperature of Extremity: [Left:Warm] [Right:Warm] Capillary Refill: [Left:< 3 seconds] [Right:< 3 seconds] Toe Nail Assessment Left: Right: Thick: Yes Yes Discolored: Yes Yes Deformed: Yes Yes Improper Length and Hygiene: Yes Yes Electronic Signature(s) Signed: 01/19/2016 7:01:21 PM By: Alejandro Mulling Entered By: Alejandro Mulling on 01/19/2016 09:38:34 [name], [name] Kitchen ([id number]) -------------------------------------------------------------------------------- Multi Wound Chart Details Patient Name: [name] Date of Service: 01/19/2016 8:45 AM Medical Record Number: [id number] Patient Account  Number: [id number] Date of Birth/Sex: 08-03-1946 (70 y.o. Male) Treating RN: Phillis Haggis Primary Care Physician: Oretha Milch Other Clinician: Referring Physician: Oretha Milch Treating Physician/Extender: Rudene Re in Treatment: 2 Vital Signs Height(in): 70 Pulse(bpm): 79 Weight(lbs): 235 Blood Pressure 129/62 (mmHg): Body Mass Index(BMI): 34 Temperature(F): 97.8 Respiratory Rate 20 (breaths/min): Photos: [1:No Photos] [2:No Photos] [3:No Photos] Wound Location: [1:Left Lower Leg] [2:Right Lower Leg] [3:Right Calcaneus] Wounding Event: [1:Gradually Appeared] [2:Gradually Appeared] [3:Pressure Injury] Primary Etiology: [1:To be determined] [2:To be determined] [3:Pressure Ulcer] Comorbid History: [1:Cataracts, Chronic Obstructive Pulmonary Disease (COPD), Sleep Disease (COPD), Sleep Disease (COPD), Sleep Apnea, Angina, Congestive Heart Failure, Congestive  Heart Failure, Congestive Heart Failure, Hypertension, Type II Diabetes,  Gout, Osteoarthritis, Neuropathy Osteoarthritis, Neuropathy Osteoarthritis, Neuropathy] [2:Cataracts, Chronic Obstructive Pulmonary Apnea, Angina, Hypertension, Type II Diabetes, Gout,] [3:Cataracts, Chronic Obstructive Pulmonary Apnea, Angina,  Hypertension, Type II Diabetes, Gout,] Date Acquired: [1:12/31/2014] [2:12/31/2014] [3:10/31/2015] Weeks of Treatment: [1:2] [2:2] [3:2] Wound Status: [1:Open] [2:Open] [3:Open] Measurements L x W x D 9x4x0.1 [2:6x11x0.1] [3:3.1x3.5x0.2] (cm) Area (cm) : [1:28.274] [2:51.836] [3:8.522] Volume (cm) : [1:2.827] [2:5.184] [3:1.704] % Reduction in Area: [1:-15.40%] [2:-21864.40%] [3:-20.60%] % Reduction in Volume: -15.40% [2:-21500.00%] [3:-20.50%] Classification: [1:Partial Thickness] [2:Partial Thickness] [3:Category/Stage III] HBO Classification: [1:Grade 1] [2:Grade 1] [3:Grade 1] Exudate Amount: [1:Large] [2:Large] [3:Large] Exudate Type: [1:Serosanguineous] [2:Serosanguineous]  [3:Serosanguineous] Exudate Color: [1:red, brown] [2:red, brown] [3:red, brown] Wound Margin: [1:Flat and Intact] [2:Flat and Intact] [3:Flat and Intact] Granulation Amount: [1:Medium (34-66%)] [2:Small (1-33%)] [3:Small (1-33%)] Granulation Quality: [1:Red, Pink] [2:Red, Pink] [3:Red, Pink] Necrotic Amount: [1:Medium (34-66%)] [2:Large (67-100%)] [3:Large (67-100%)] Exposed Structures: Fascia: No Fascia: No Fascia: No Fat: No Fat: No Fat: No Tendon: No Tendon: No Tendon: No Muscle: No Muscle: No Muscle: No Joint: No Joint: No Joint: No Bone: No Bone: No Bone: No Limited to Skin Limited to Skin Limited to Skin Breakdown Breakdown Breakdown Epithelialization: None None None Periwound Skin Texture: Edema: Yes Edema: Yes Edema: Yes Periwound Skin Moist: Yes Moist: Yes Maceration: Yes Moisture: Moist: Yes Periwound Skin Color: Erythema: Yes Erythema: Yes Erythema: Yes Erythema Location: Circumferential Circumferential Circumferential Temperature: No Abnormality No Abnormality No Abnormality Tenderness on Yes Yes Yes Palpation: Wound Preparation: Ulcer Cleansing: Other: Ulcer Cleansing: Other: Ulcer Cleansing: soap and water soap and water Rinsed/Irrigated with Saline Topical Anesthetic Topical Anesthetic Applied: Other: lidocaine Applied: Other: lidocaine Topical Anesthetic 4% 4% Applied: Other: lidocaine 4% Wound Number: 4 N/A N/A Photos: No Photos N/A N/A Wound Location: Left, Distal Gluteal fold N/A N/A Wounding Event: Pressure Injury N/A N/A Primary Etiology: Pressure Ulcer N/A N/A Comorbid History: N/A N/A N/A Date Acquired: 12/31/2014 N/A N/A Weeks of Treatment: 2 N/A N/A Wound Status: Open N/A N/A Measurements L x W x D 0x0x0 N/A N/A (cm) Area (cm) : 0 N/A N/A Volume (cm) : 0 N/A N/A % Reduction in Area: 100.00% N/A N/A % Reduction in Volume: 100.00% N/A N/A Classification: Category/Stage II N/A N/A HBO Classification: N/A N/A N/A Exudate Amount:  N/A N/A N/A Exudate Type: N/A N/A N/A Exudate Color: N/A N/A N/A Wound Margin: N/A N/A N/A Granulation Amount: N/A N/A N/A Granulation Quality: N/A N/A N/A Necrotic Amount: N/A N/A N/A [name], [name] Kitchen ([id number]) Exposed Structures: N/A N/A N/A Epithelialization: N/A N/A N/A Periwound Skin Texture: No Abnormalities Noted N/A N/A Periwound Skin No Abnormalities Noted N/A N/A Moisture: Periwound Skin Color: No Abnormalities Noted N/A N/A Erythema Location: N/A N/A N/A Temperature: N/A N/A N/A Tenderness on No N/A N/A Palpation: Wound Preparation: N/A N/A N/A Treatment Notes Electronic Signature(s) Signed: 01/19/2016 7:01:21 PM By: Alejandro Mulling Entered By: Alejandro Mulling on 01/19/2016 09:56:25 [name] ([id number]) -------------------------------------------------------------------------------- Multi-Disciplinary Care Plan Details Patient Name: [name] Date of Service: 01/19/2016 8:45 AM Medical Record Number: [id number] Patient Account Number: [id number] Date of Birth/Sex: Oct 03, 1946 (70 y.o. Male) Treating RN: Phillis Haggis Primary Care Physician: Oretha Milch Other Clinician: Referring Physician: Oretha Milch Treating Physician/Extender: Rudene Re in Treatment: 2 Active Inactive Electronic Signature(s) Signed: 01/19/2016 7:01:21 PM By: Alejandro Mulling Entered By: Alejandro Mulling on 01/19/2016 09:52:26 [name] ([id number]) -------------------------------------------------------------------------------- Pain Assessment Details Patient Name: [name] Date of Service: 01/19/2016  8:45 AM Medical Record Number: [id number] Patient Account Number: [id number] Date of Birth/Sex: [birth year] [age] y.o. Male) Treating RN: Phillis HaggisPinkerton, Debi Primary Care Physician: Oretha MilchSMITH, [name] Other Clinician: Referring Physician: Oretha MilchSMITH, [name] Treating Physician/Extender: Rudene ReBritto, Errol Weeks in Treatment: 2 Active Problems Location of Pain Severity and  Description of Pain Patient Has Paino No Site Locations Pain Management and Medication Current Pain Management: Electronic Signature(s) Signed: 01/19/2016 7:01:21 PM By: Alejandro MullingPinkerton, Debra Entered By: Alejandro MullingPinkerton, Debra on 01/19/2016 09:06:08 [name] OttoLINDLEY, [name] ([id number]) -------------------------------------------------------------------------------- Patient/Caregiver Education Details Patient Name: [name] OttoLINDLEY, [name] Date of Service: 01/19/2016 8:45 AM Medical Record Number: [id number] Patient Account Number: [id number] Date of Birth/Gender: [birth year] [age] y.o. Male) Treating RN: Phillis HaggisPinkerton, Debi Primary Care Physician: Oretha MilchSMITH, [name] Other Clinician: Referring Physician: Oretha MilchSMITH, [name] Treating Physician/Extender: Rudene ReBritto, Errol Weeks in Treatment: 2 Education Assessment Education Provided To: Patient Education Topics Provided Wound/Skin Impairment: Handouts: Other: change dressing as ordered, keep wraps clean and dry Methods: Demonstration, Explain/Verbal Responses: State content correctly Electronic Signature(s) Signed: 01/19/2016 7:01:21 PM By: Alejandro MullingPinkerton, Debra Entered By: Alejandro MullingPinkerton, Debra on 01/19/2016 10:01:50 [name] OttoLINDLEY, [name] ([id number]) -------------------------------------------------------------------------------- Wound Assessment Details Patient Name: [name] OttoLINDLEY, [name] Date of Service: 01/19/2016 8:45 AM Medical Record Number: [id number] Patient Account Number: [id number] Date of Birth/Sex: [birth year] [age] y.o. Male) Treating RN: Phillis HaggisPinkerton, Debi Primary Care Physician: Oretha MilchSMITH, [name] Other Clinician: Referring Physician: Oretha MilchSMITH, [name] Treating Physician/Extender: Rudene ReBritto, Errol Weeks in Treatment: 2 Wound Status Wound Number: 1 Primary To be determined Etiology: Wound Location: Left Lower Leg Wound Open Wounding Event: Gradually Appeared Status: Date Acquired: 12/31/2014 Comorbid Cataracts, Chronic Obstructive Weeks Of Treatment: 2 History: Pulmonary Disease (COPD),  Sleep Clustered Wound: No Apnea, Angina, Congestive Heart Failure, Hypertension, Type II Diabetes, Gout, Osteoarthritis, Neuropathy Photos Photo Uploaded By: Alejandro MullingPinkerton, Debra on 01/19/2016 17:48:21 Wound Measurements Length: (cm) 9 Width: (cm) 4 Depth: (cm) 0.1 Area: (cm) 28.274 Volume: (cm) 2.827 % Reduction in Area: -15.4% % Reduction in Volume: -15.4% Epithelialization: None Tunneling: No Undermining: No Wound Description Classification: Partial Thickness Foul Odor Af Diabetic Severity (Wagner): Grade 1 Wound Margin: Flat and Intact Exudate Amount: Large Exudate Type: Serosanguineous Exudate Color: red, brown ter Cleansing: No Wound Bed Granulation Amount: Medium (34-66%) Exposed Structure Kahan, Jacarius [name]. ([id number]) Granulation Quality: Red, Pink Fascia Exposed: No Necrotic Amount: Medium (34-66%) Fat Layer Exposed: No Necrotic Quality: Adherent Slough Tendon Exposed: No Muscle Exposed: No Joint Exposed: No Bone Exposed: No Limited to Skin Breakdown Periwound Skin Texture Texture Color No Abnormalities Noted: No No Abnormalities Noted: No Localized Edema: Yes Erythema: Yes Erythema Location: Circumferential Moisture No Abnormalities Noted: No Temperature / Pain Moist: Yes Temperature: No Abnormality Tenderness on Palpation: Yes Wound Preparation Ulcer Cleansing: Other: soap and water, Topical Anesthetic Applied: Other: lidocaine 4%, Treatment Notes Wound #1 (Left Lower Leg) 1. Cleansed with: Cleanse wound with antibacterial soap and water 2. Anesthetic Topical Lidocaine 4% cream to wound bed prior to debridement 4. Dressing Applied: Aquacel Ag 5. Secondary Dressing Applied ABD Pad 7. Secured with Paper tape 3 Layer Compression System - Bilateral Electronic Signature(s) Signed: 01/19/2016 7:01:21 PM By: Alejandro MullingPinkerton, Debra Entered By: Alejandro MullingPinkerton, Debra on 01/19/2016 09:32:38 [name] OttoLINDLEY, Derry [name].  ([id number]) -------------------------------------------------------------------------------- Wound Assessment Details Patient Name: [name] OttoLINDLEY, [name] Date of Service: 01/19/2016 8:45 AM Medical Record Number: [id number] Patient Account Number: [id number] Date of Birth/Sex: [birth year] [age] y.o. Male) Treating RN: Phillis HaggisPinkerton, Debi Primary Care Physician: Oretha MilchSMITH, [name] Other Clinician: Referring Physician: Oretha MilchSMITH, [name] Treating Physician/Extender: Rudene ReBritto, Errol Weeks in Treatment: 2 Wound Status Wound Number: 2 Primary To be determined Etiology:  Wound Location: Right Lower Leg Wound Open Wounding Event: Gradually Appeared Status: Date Acquired: 12/31/2014 Comorbid Cataracts, Chronic Obstructive Weeks Of Treatment: 2 History: Pulmonary Disease (COPD), Sleep Clustered Wound: No Apnea, Angina, Congestive Heart Failure, Hypertension, Type II Diabetes, Gout, Osteoarthritis, Neuropathy Photos Photo Uploaded By: Alejandro Mulling on 01/19/2016 17:49:33 Wound Measurements Length: (cm) 6 Width: (cm) 11 Depth: (cm) 0.1 Area: (cm) 51.836 Volume: (cm) 5.184 % Reduction in Area: -21864.4% % Reduction in Volume: -21500% Epithelialization: None Tunneling: No Undermining: No Wound Description Classification: Partial Thickness Foul Odor A Diabetic Severity (Wagner): Grade 1 Wound Margin: Flat and Intact Exudate Amount: Large Exudate Type: Serosanguineous Exudate Color: red, brown fter Cleansing: No Wound Bed Granulation Amount: Small (1-33%) Exposed Structure Edberg, Tong [name]. ([id number]) Granulation Quality: Red, Pink Fascia Exposed: No Necrotic Amount: Large (67-100%) Fat Layer Exposed: No Necrotic Quality: Adherent Slough Tendon Exposed: No Muscle Exposed: No Joint Exposed: No Bone Exposed: No Limited to Skin Breakdown Periwound Skin Texture Texture Color No Abnormalities Noted: No No Abnormalities Noted: No Localized Edema: Yes Erythema: Yes Erythema Location:  Circumferential Moisture No Abnormalities Noted: No Temperature / Pain Moist: Yes Temperature: No Abnormality Tenderness on Palpation: Yes Wound Preparation Ulcer Cleansing: Other: soap and water, Topical Anesthetic Applied: Other: lidocaine 4%, Treatment Notes Wound #2 (Right Lower Leg) 1. Cleansed with: Cleanse wound with antibacterial soap and water 2. Anesthetic Topical Lidocaine 4% cream to wound bed prior to debridement 4. Dressing Applied: Aquacel Ag 5. Secondary Dressing Applied ABD Pad 7. Secured with Paper tape 3 Layer Compression System - Bilateral Electronic Signature(s) Signed: 01/19/2016 7:01:21 PM By: Alejandro Mulling Entered By: Alejandro Mulling on 01/19/2016 09:33:16 [name], [name] Kitchen ([id number]) -------------------------------------------------------------------------------- Wound Assessment Details Patient Name: [name] Date of Service: 01/19/2016 8:45 AM Medical Record Number: [id number] Patient Account Number: [id number] Date of Birth/Sex: [birth year] (70 y.o. Male) Treating RN: Phillis Haggis Primary Care Physician: Oretha Milch Other Clinician: Referring Physician: Oretha Milch Treating Physician/Extender: Rudene Re in Treatment: 2 Wound Status Wound Number: 3 Primary Pressure Ulcer Etiology: Wound Location: Right Calcaneus Wound Open Wounding Event: Pressure Injury Status: Date Acquired: 10/31/2015 Comorbid Cataracts, Chronic Obstructive Weeks Of Treatment: 2 History: Pulmonary Disease (COPD), Sleep Clustered Wound: No Apnea, Angina, Congestive Heart Failure, Hypertension, Type II Diabetes, Gout, Osteoarthritis, Neuropathy Photos Photo Uploaded By: Alejandro Mulling on 01/19/2016 17:49:12 Wound Measurements Length: (cm) 3.1 Width: (cm) 3.5 Depth: (cm) 0.2 Area: (cm) 8.522 Volume: (cm) 1.704 % Reduction in Area: -20.6% % Reduction in Volume: -20.5% Epithelialization: None Tunneling: No Undermining: No Wound  Description Classification: Category/Stage III Foul Odor A Diabetic Severity (Wagner): Grade 1 Wound Margin: Flat and Intact Exudate Amount: Large Exudate Type: Serosanguineous Exudate Color: red, brown fter Cleansing: No Wound Bed Granulation Amount: Small (1-33%) Exposed Structure Ardila, Johne [name]. ([id number]) Granulation Quality: Red, Pink Fascia Exposed: No Necrotic Amount: Large (67-100%) Fat Layer Exposed: No Necrotic Quality: Adherent Slough Tendon Exposed: No Muscle Exposed: No Joint Exposed: No Bone Exposed: No Limited to Skin Breakdown Periwound Skin Texture Texture Color No Abnormalities Noted: No No Abnormalities Noted: No Localized Edema: Yes Erythema: Yes Erythema Location: Circumferential Moisture No Abnormalities Noted: No Temperature / Pain Maceration: Yes Temperature: No Abnormality Moist: Yes Tenderness on Palpation: Yes Wound Preparation Ulcer Cleansing: Rinsed/Irrigated with Saline Topical Anesthetic Applied: Other: lidocaine 4%, Treatment Notes Wound #3 (Right Calcaneus) 1. Cleansed with: Cleanse wound with antibacterial soap and water 2. Anesthetic Topical Lidocaine 4% cream to wound bed prior to debridement 3. Peri-wound Care: Skin Prep 4. Dressing  Applied: Santyl Ointment 5. Secondary Dressing Applied Gauze and Kerlix/Conform 6. Footwear/Offloading device applied Felt/Foam 7. Secured with Secretary/administrator) Signed: 01/19/2016 7:01:21 PM By: Alejandro Mulling Entered By: Alejandro Mulling on 01/19/2016 09:34:29 [name] ([id number]) -------------------------------------------------------------------------------- Wound Assessment Details Patient Name: [name] Date of Service: 01/19/2016 8:45 AM Medical Record Number: [id number] Patient Account Number: [id number] Date of Birth/Sex: [birth year] (70 y.o. Male) Treating RN: Phillis Haggis Primary Care Physician: Oretha Milch Other Clinician: Referring  Physician: Oretha Milch Treating Physician/Extender: Rudene Re in Treatment: 2 Wound Status Wound Number: 4 Primary Etiology: Pressure Ulcer Wound Location: Left, Distal Gluteal fold Wound Status: Open Wounding Event: Pressure Injury Date Acquired: 12/31/2014 Weeks Of Treatment: 2 Clustered Wound: No Wound Measurements Length: (cm) 0 % Reduction Width: (cm) 0 % Reduction Depth: (cm) 0 Area: (cm) 0 Volume: (cm) 0 in Area: 100% in Volume: 100% Wound Description Classification: Category/Stage II Periwound Skin Texture Texture Color No Abnormalities Noted: No No Abnormalities Noted: No Moisture No Abnormalities Noted: No Electronic Signature(s) Signed: 01/19/2016 7:01:21 PM By: Alejandro Mulling Entered By: Alejandro Mulling on 01/19/2016 09:44:04 Gonce, Mayur Seth Bake ([id number]) -------------------------------------------------------------------------------- Vitals Details Patient Name: [name] Date of Service: 01/19/2016 8:45 AM Medical Record Number: [id number] Patient Account Number: [id number] Date of Birth/Sex: 1946-06-12 (70 y.o. Male) Treating RN: Phillis Haggis Primary Care Physician: Oretha Milch Other Clinician: Referring Physician: Oretha Milch Treating Physician/Extender: Rudene Re in Treatment: 2 Vital Signs Time Taken: 09:06 Temperature (F): 97.8 Height (in): 70 Pulse (bpm): 79 Weight (lbs): 235 Respiratory Rate (breaths/min): 20 Body Mass Index (BMI): 33.7 Blood Pressure (mmHg): 129/62 Reference Range: 80 - 120 mg / dl Electronic Signature(s) Signed: 01/19/2016 7:01:21 PM By: Alejandro Mulling Entered By: Alejandro Mulling on 01/19/2016 09:06:44

## 2016-01-26 ENCOUNTER — Encounter: Payer: Medicare Other | Admitting: Surgery

## 2016-01-26 DIAGNOSIS — E11621 Type 2 diabetes mellitus with foot ulcer: Secondary | ICD-10-CM | POA: Diagnosis not present

## 2016-01-26 NOTE — Progress Notes (Addendum)
TALLAN, SANDOZ ([id number]) Visit Report for 01/26/2016 Chief Complaint Document Details Patient Name: [name] Date of Service: 01/26/2016 8:45 AM Medical Record Number: [id number] Patient Account Number: [id number] Date of Birth/Sex: 1945-12-18 (70 y.o. Male) Treating RN: Phillis Haggis Primary Care Physician: Oretha Milch Other Clinician: Referring Physician: Oretha Milch Treating Physician/Extender: Rudene Re in Treatment: 3 Information Obtained from: Patient Chief Complaint Patient is at the clinic for treatment of an open pressure ulcer the left upper thigh and gluteal region and the right heel with bilateral swelling of the legs all of it which is going on for over a year Electronic Signature(s) Signed: 01/26/2016 9:49:15 AM By: Evlyn Kanner MD, FACS Entered By: Evlyn Kanner on 01/26/2016 09:49:15 [name] ([id number]) -------------------------------------------------------------------------------- Debridement Details Patient Name: [name] Date of Service: 01/26/2016 8:45 AM Medical Record Number: [id number] Patient Account Number: [id number] Date of Birth/Sex: [birth year] (70 y.o. Male) Treating RN: Phillis Haggis Primary Care Physician: Oretha Milch Other Clinician: Referring Physician: Oretha Milch Treating Physician/Extender: Rudene Re in Treatment: 3 Debridement Performed for Wound #5 Right,Dorsal Toe Fifth Assessment: Performed By: Physician Evlyn Kanner, MD Debridement: Debridement Pre-procedure Yes Verification/Time Out Taken: Start Time: 09:30 Pain Control: Lidocaine 5% topical ointment Level: Skin/Subcutaneous Tissue Total Area Debrided (L x 0.7 (cm) x 0.4 (cm) = 0.28 (cm) W): Tissue and other Fibrin/Slough, Other, Subcutaneous material debrided: Instrument: Scissors Bleeding: Minimum Hemostasis Achieved: Pressure End Time: 09:33 Procedural Pain: 2 Post Procedural Pain: 0 Response to Treatment: Procedure  was tolerated well Post Debridement Measurements of Total Wound Length: (cm) 0.7 Width: (cm) 0.4 Depth: (cm) 0.2 Volume: (cm) 0.044 Post Procedure Diagnosis Same as Pre-procedure Notes the nail which had fungal infection and the base of the nailbed was debrided sharply and all the necrotic tissue removed. Electronic Signature(s) Signed: 01/26/2016 9:49:08 AM By: Evlyn Kanner MD, FACS Signed: 01/26/2016 3:06:35 PM By: Alejandro Mulling Entered By: Evlyn Kanner on 01/26/2016 09:49:08 [name] ([id number]) [name], [name] ([id number]) -------------------------------------------------------------------------------- HPI Details Patient Name: [name] Date of Service: 01/26/2016 8:45 AM Medical Record Number: [id number] Patient Account Number: [id number] Date of Birth/Sex: [birth year] (70 y.o. Male) Treating RN: Phillis Haggis Primary Care Physician: Oretha Milch Other Clinician: Referring Physician: Oretha Milch Treating Physician/Extender: Rudene Re in Treatment: 3 History of Present Illness Location: ulcerated area on the right heel, left gluteal region and thigh and then bilateral lower extremities Quality: Patient reports experiencing a sharp pain to affected area(s). Severity: Patient states wound are getting worse. Duration: Patient has had the wound for > 12 months prior to seeking treatment at the wound center Timing: Pain in wound is constant (hurts all the time) Context: The wound appeared gradually over time Modifying Factors: Other treatment(s) tried include: he sees his heart doctor and his primary care doctor Associated Signs and Symptoms: patient has not been able to walk for over a year now HPI Description: 70 year old gentleman with a known history of hypertension, diabetes, obstructive sleep apnea, COPD, diastolic CHF, coronary artery disease was admitted to the hospital with sepsis from an ulcer of the right heel and was treated there in  October 2016. he also has chronic bilateral lower extremity edema and lymphedema. He had received vancomycin, Zosyn and at that stage and x-rays showed hardware in the right ankle but no evidence of osteomyelitis. he was a former smoker. he was also treated with Augmentin orally for 10 days. He is either bedbound or wheelchair-bound and does not ambulate by himself. 01/19/2016 --  he has not been seen here for 3 weeks and this was because he was admitted to [facility] between 223 and 01/08/2016 for sepsis, UTI and pneumonia involving the left lung. he was treated with IV vancomycin and Zosyn and then meropenem. Was discharged home on oral Bactrim for 2 weeks none of his vascular test or x-rays were done and we will reorder these. 01/26/2016 -- he has not yet done the x-ray of his foot and is vascular tests are still pending. I have asked him to work on these with his nursing home staff. Addendum: he has got an x-ray of the right foot done which shows that his osteopenia but no specific ostial lysis or abnormal periosteal reaction. Final impression was degenerative changes with dorsal foot soft tissue swelling. Electronic Signature(s) Signed: 01/26/2016 2:40:26 PM By: Evlyn Kanner MD, FACS Previous Signature: 01/26/2016 9:49:40 AM Version By: Evlyn Kanner MD, FACS Entered By: Evlyn Kanner on 01/26/2016 14:40:26 [name] ([id number]) -------------------------------------------------------------------------------- Physical Exam Details Patient Name: [name] Date of Service: 01/26/2016 8:45 AM Medical Record Number: [id number] Patient Account Number: [id number] Date of Birth/Sex: 07-May-1946 (70 y.o. Male) Treating RN: Phillis Haggis Primary Care Physician: Oretha Milch Other Clinician: Referring Physician: Oretha Milch Treating Physician/Extender: Rudene Re in Treatment: 3 Constitutional . Pulse regular. Respirations normal and unlabored.  Afebrile. . Eyes Nonicteric. Reactive to light. Ears, Nose, Mouth, and Throat Lips, teeth, and gums WNL.Marland Kitchen Moist mucosa without lesions. Neck supple and nontender. No palpable supraclavicular or cervical adenopathy. Normal sized without goiter. Respiratory WNL. No retractions.. Cardiovascular Pedal Pulses WNL. No clubbing, cyanosis or edema. Lymphatic No adneopathy. No adenopathy. No adenopathy. Musculoskeletal Adexa without tenderness or enlargement.. Digits and nails w/o clubbing, cyanosis, infection, petechiae, ischemia, or inflammatory conditions.. Integumentary (Hair, Skin) No suspicious lesions. No crepitus or fluctuance. No peri-wound warmth or erythema. No masses.Marland Kitchen Psychiatric Judgement and insight Intact.. No evidence of depression, anxiety, or agitation.. Notes he has severe tenderness on the right heel and I'm unable to debride this stage III pressure ulceration. The right fourth toenail and base of the nailbed has significant amount of debris and I have sharply debrided this with forceps and scissors and removed the nail and the subcutaneous debris in this area. There is no probing down to bone. Both lower extremities has improvement in the ulceration and the edema. Electronic Signature(s) Signed: 01/26/2016 9:50:41 AM By: Evlyn Kanner MD, FACS Entered By: Evlyn Kanner on 01/26/2016 09:50:40 [name] ([id number]) -------------------------------------------------------------------------------- Physician Orders Details Patient Name: [name] Date of Service: 01/26/2016 8:45 AM Medical Record Number: [id number] Patient Account Number: [id number] Date of Birth/Sex: 21-Feb-1946 (70 y.o. Male) Treating RN: Phillis Haggis Primary Care Physician: Oretha Milch Other Clinician: Referring Physician: Oretha Milch Treating Physician/Extender: Rudene Re in Treatment: 3 Verbal / Phone Orders: Yes ClinicianAshok Cordia, Debi Read Back and Verified:  Yes Diagnosis Coding Wound Cleansing Wound #1 Left Lower Leg o May shower with protection. - please cover wraps and keep dry o No tub bath. Wound #5 Right,Dorsal Toe Fifth o May shower with protection. - please cover wraps and keep dry o No tub bath. Wound #2 Right Lower Leg o May shower with protection. - please cover wraps and keep dry o No tub bath. Wound #3 Right Calcaneus o Cleanse wound with mild soap and water o May Shower, gently pat wound dry prior to applying new dressing. o May shower with protection. - please cover wraps and keep dry o No tub  bath. Anesthetic Wound #1 Left Lower Leg o Topical Lidocaine 4% cream applied to wound bed prior to debridement Wound #2 Right Lower Leg o Topical Lidocaine 4% cream applied to wound bed prior to debridement Wound #3 Right Calcaneus o Topical Lidocaine 4% cream applied to wound bed prior to debridement Skin Barriers/Peri-Wound Care Wound #3 Right Calcaneus o Skin Prep Primary Wound Dressing Wound #1 Left Lower Leg o Aquacel Ag Reddoch, Kortney V. ([id number]) Wound #5 Right,Dorsal Toe Fifth o Aquacel Ag Wound #2 Right Lower Leg o Aquacel Ag Wound #3 Right Calcaneus o Santyl Ointment Secondary Dressing Wound #1 Left Lower Leg o ABD pad Wound #2 Right Lower Leg o ABD pad Wound #3 Right Calcaneus o Gauze, ABD and Kerlix/Conform o Foam Wound #5 Right,Dorsal Toe Fifth o Dry Gauze - tape o Gauze and Kerlix/Conform Dressing Change Frequency Wound #1 Left Lower Leg o Change dressing every week - in clinic Wound #2 Right Lower Leg o Change dressing every week - in clinic Wound #3 Right Calcaneus o Change dressing every day. - daily with santyl Wound #5 Right,Dorsal Toe Fifth o Change dressing every other day. Follow-up Appointments Wound #1 Left Lower Leg o Return Appointment in 1 week. Wound #5 Right,Dorsal Toe Fifth o Return Appointment in 1 week. Wound #2  Right Lower Leg o Return Appointment in 1 week. ONIEL, MELESKI VMarland Kitchen ([id number]) Wound #3 Right Calcaneus o Return Appointment in 1 week. Edema Control Wound #1 Left Lower Leg o 3 Layer Compression System - Bilateral o Elevate legs to the level of the heart and pump ankles as often as possible Wound #2 Right Lower Leg o 3 Layer Compression System - Bilateral o Elevate legs to the level of the heart and pump ankles as often as possible Wound #3 Right Calcaneus o Elevate legs to the level of the heart and pump ankles as often as possible Off-Loading Wound #1 Left Lower Leg o Turn and reposition every 2 hours o Other: - sage boots Wound #2 Right Lower Leg o Turn and reposition every 2 hours o Other: - sage boots Wound #3 Right Calcaneus o Turn and reposition every 2 hours o Other: - sage boots Notes We have sent an order for a foot x-ray two times can you please see that this gets done. Pt also needs to see a podiatrist as soon as possible. Electronic Signature(s) Signed: 01/26/2016 3:06:35 PM By: Alejandro Mulling Signed: 01/26/2016 3:37:46 PM By: Evlyn Kanner MD, FACS Entered By: Alejandro Mulling on 01/26/2016 09:46:45 [name] ([id number]) -------------------------------------------------------------------------------- Problem List Details Patient Name: [name] Date of Service: 01/26/2016 8:45 AM Medical Record Number: [id number] Patient Account Number: [id number] Date of Birth/Sex: November 19, 1945 (70 y.o. Male) Treating RN: Phillis Haggis Primary Care Physician: Oretha Milch Other Clinician: Referring Physician: Oretha Milch Treating Physician/Extender: Rudene Re in Treatment: 3 Active Problems ICD-10 Encounter Code Description Active Date Diagnosis E11.621 Type 2 diabetes mellitus with foot ulcer 01/01/2016 Yes L89.613 Pressure ulcer of right heel, stage 3 01/01/2016 Yes E66.01 Morbid (severe) obesity due to excess calories  01/01/2016 Yes I89.0 Lymphedema, not elsewhere classified 01/01/2016 Yes M70.871 Other soft tissue disorders related to use, overuse and 01/19/2016 Yes pressure, right ankle and foot Inactive Problems Resolved Problems ICD-10 Code Description Active Date Resolved Date L89.322 Pressure ulcer of left buttock, stage 2 01/01/2016 01/01/2016 Electronic Signature(s) Signed: 01/26/2016 9:46:28 AM By: Evlyn Kanner MD, FACS Previous Signature: 01/26/2016 9:39:41 AM Version By: Evlyn Kanner MD, FACS Entered By: Evlyn Kanner on  01/26/2016 09:46:28 [name] ([id number]) -------------------------------------------------------------------------------- Progress Note Details Patient Name: [name], [name] Date of Service: 01/26/2016 8:45 AM Medical Record Number: [id number] Patient Account Number: [id number] Date of Birth/Sex: 1946-10-05 (70 y.o. Male) Treating RN: Phillis Haggis Primary Care Physician: Oretha Milch Other Clinician: Referring Physician: Oretha Milch Treating Physician/Extender: Rudene Re in Treatment: 3 Subjective Chief Complaint Information obtained from Patient Patient is at the clinic for treatment of an open pressure ulcer the left upper thigh and gluteal region and the right heel with bilateral swelling of the legs all of it which is going on for over a year History of Present Illness (HPI) The following HPI elements were documented for the patient's wound: Location: ulcerated area on the right heel, left gluteal region and thigh and then bilateral lower extremities Quality: Patient reports experiencing a sharp pain to affected area(s). Severity: Patient states wound are getting worse. Duration: Patient has had the wound for > 12 months prior to seeking treatment at the wound center Timing: Pain in wound is constant (hurts all the time) Context: The wound appeared gradually over time Modifying Factors: Other treatment(s) tried include: he sees his heart doctor  and his primary care doctor Associated Signs and Symptoms: patient has not been able to walk for over a year now 70 year old gentleman with a known history of hypertension, diabetes, obstructive sleep apnea, COPD, diastolic CHF, coronary artery disease was admitted to the hospital with sepsis from an ulcer of the right heel and was treated there in October 2016. he also has chronic bilateral lower extremity edema and lymphedema. He had received vancomycin, Zosyn and at that stage and x-rays showed hardware in the right ankle but no evidence of osteomyelitis. he was a former smoker. he was also treated with Augmentin orally for 10 days. He is either bedbound or wheelchair-bound and does not ambulate by himself. 01/19/2016 -- he has not been seen here for 3 weeks and this was because he was admitted to [facility] between 223 and 01/08/2016 for sepsis, UTI and pneumonia involving the left lung. he was treated with IV vancomycin and Zosyn and then meropenem. Was discharged home on oral Bactrim for 2 weeks none of his vascular test or x-rays were done and we will reorder these. 01/26/2016 -- he has not yet done the x-ray of his foot and is vascular tests are still pending. I have asked him to work on these with his nursing home staff. Addendum: he has got an x-ray of the right foot done which shows that his osteopenia but no specific ostial lysis or abnormal periosteal reaction. Final impression was degenerative changes with dorsal foot soft tissue swelling. [name], [name] ([id number]) Objective Constitutional Pulse regular. Respirations normal and unlabored. Afebrile. Vitals Time Taken: 8:57 AM, Height: 70 in, Weight: 235 lbs, BMI: 33.7, Temperature: 97.5 F, Pulse: 75 bpm, Respiratory Rate: 20 breaths/min, Blood Pressure: 127/57 mmHg. Eyes Nonicteric. Reactive to light. Ears, Nose, Mouth, and Throat Lips, teeth, and gums WNL.Marland Kitchen Moist mucosa without  lesions. Neck supple and nontender. No palpable supraclavicular or cervical adenopathy. Normal sized without goiter. Respiratory WNL. No retractions.. Cardiovascular Pedal Pulses WNL. No clubbing, cyanosis or edema. Lymphatic No adneopathy. No adenopathy. No adenopathy. Musculoskeletal Adexa without tenderness or enlargement.. Digits and nails w/o clubbing, cyanosis, infection, petechiae, ischemia, or inflammatory conditions.Marland Kitchen Psychiatric Judgement and insight Intact.. No evidence of depression, anxiety, or agitation.. General Notes: he has severe tenderness on the right heel and I'm unable to debride this stage  III pressure ulceration. The right fourth toenail and base of the nailbed has significant amount of debris and I have sharply debrided this with forceps and scissors and removed the nail and the subcutaneous debris in this area. There is no probing down to bone. Both lower extremities has improvement in the ulceration and the edema. Integumentary (Hair, Skin) No suspicious lesions. No crepitus or fluctuance. No peri-wound warmth or erythema. No masses.. Wound #1 status is Open. Original cause of wound was Gradually Appeared. The wound is located on the Left Lower Leg. The wound measures 9cm length x 4cm width x 0.1cm depth; 28.274cm^2 area and 2.827cm^3 volume. The wound is limited to skin breakdown. There is no tunneling or undermining noted. [name], [name] ([id number]) There is a large amount of serosanguineous drainage noted. The wound margin is flat and intact. There is medium (34-66%) red, pink granulation within the wound bed. There is a medium (34-66%) amount of necrotic tissue within the wound bed including Adherent Slough. The periwound skin appearance exhibited: Localized Edema, Moist, Erythema. The surrounding wound skin color is noted with erythema which is circumferential. Periwound temperature was noted as No Abnormality. The periwound has tenderness  on palpation. Wound #2 status is Open. Original cause of wound was Gradually Appeared. The wound is located on the Right Lower Leg. The wound measures 6cm length x 11cm width x 0.1cm depth; 51.836cm^2 area and 5.184cm^3 volume. The wound is limited to skin breakdown. There is no tunneling or undermining noted. There is a large amount of serosanguineous drainage noted. The wound margin is flat and intact. There is small (1-33%) red, pink granulation within the wound bed. There is a large (67-100%) amount of necrotic tissue within the wound bed including Adherent Slough. The periwound skin appearance exhibited: Localized Edema, Moist, Erythema. The surrounding wound skin color is noted with erythema which is circumferential. Periwound temperature was noted as No Abnormality. The periwound has tenderness on palpation. Wound #3 status is Open. Original cause of wound was Pressure Injury. The wound is located on the Right Calcaneus. The wound measures 2.3cm length x 4.3cm width x 0.2cm depth; 7.768cm^2 area and 1.554cm^3 volume. The wound is limited to skin breakdown. There is no tunneling or undermining noted. There is a large amount of serosanguineous drainage noted. The wound margin is flat and intact. There is small (1-33%) red, pink granulation within the wound bed. There is a large (67-100%) amount of necrotic tissue within the wound bed including Eschar and Adherent Slough. The periwound skin appearance exhibited: Localized Edema, Maceration, Moist, Erythema. The surrounding wound skin color is noted with erythema which is circumferential. Periwound temperature was noted as No Abnormality. The periwound has tenderness on palpation. Wound #5 status is Open. Original cause of wound was Trauma. The wound is located on the Right,Dorsal Toe Fifth. The wound measures 0.7cm length x 0.4cm width x 0.1cm depth; 0.22cm^2 area and 0.022cm^3 volume. The wound is limited to skin breakdown. There is no  tunneling or undermining noted. There is a medium amount of serosanguineous drainage noted. The wound margin is flat and intact. There is large (67-100%) red, pink granulation within the wound bed. There is no necrotic tissue within the wound bed. The periwound skin appearance exhibited: Moist. Assessment Active Problems ICD-10 E11.621 - Type 2 diabetes mellitus with foot ulcer L89.613 - Pressure ulcer of right heel, stage 3 E66.01 - Morbid (severe) obesity due to excess calories I89.0 - Lymphedema, not elsewhere classified M70.871 - Other soft tissue disorders  related to use, overuse and pressure, right ankle and foot [name], [name] ([id number]) Procedures Wound #5 Wound #5 is a Trauma, Other located on the Right,Dorsal Toe Fifth . There was a Skin/Subcutaneous Tissue Debridement ([id number]) debridement with total area of 0.28 sq cm performed by Evlyn Kanner, MD. with the following instrument(s): Scissors including Fibrin/Slough, Other, and Subcutaneous after achieving pain control using Lidocaine 5% topical ointment. A time out was conducted prior to the start of the procedure. A Minimum amount of bleeding was controlled with Pressure. The procedure was tolerated well with a pain level of 2 throughout and a pain level of 0 following the procedure. Post Debridement Measurements: 0.7cm length x 0.4cm width x 0.2cm depth; 0.044cm^3 volume. Post procedure Diagnosis Wound #5: Same as Pre-Procedure General Notes: the nail which had fungal infection and the base of the nailbed was debrided sharply and all the necrotic tissue removed.. Plan Wound Cleansing: Wound #1 Left Lower Leg: May shower with protection. - please cover wraps and keep dry No tub bath. Wound #5 Right,Dorsal Toe Fifth: May shower with protection. - please cover wraps and keep dry No tub bath. Wound #2 Right Lower Leg: May shower with protection. - please cover wraps and keep dry No tub bath. Wound #3 Right  Calcaneus: Cleanse wound with mild soap and water May Shower, gently pat wound dry prior to applying new dressing. May shower with protection. - please cover wraps and keep dry No tub bath. Anesthetic: Wound #1 Left Lower Leg: Topical Lidocaine 4% cream applied to wound bed prior to debridement Wound #2 Right Lower Leg: Topical Lidocaine 4% cream applied to wound bed prior to debridement Wound #3 Right Calcaneus: Topical Lidocaine 4% cream applied to wound bed prior to debridement Skin Barriers/Peri-Wound Care: Wound #3 Right Calcaneus: Skin Prep Primary Wound Dressing: Wound #1 Left Lower Leg: [name], [name] ([id number]) Aquacel Ag Wound #5 Right,Dorsal Toe Fifth: Aquacel Ag Wound #2 Right Lower Leg: Aquacel Ag Wound #3 Right Calcaneus: Santyl Ointment Secondary Dressing: Wound #1 Left Lower Leg: ABD pad Wound #2 Right Lower Leg: ABD pad Wound #3 Right Calcaneus: Gauze, ABD and Kerlix/Conform Foam Wound #5 Right,Dorsal Toe Fifth: Dry Gauze - tape Gauze and Kerlix/Conform Dressing Change Frequency: Wound #1 Left Lower Leg: Change dressing every week - in clinic Wound #2 Right Lower Leg: Change dressing every week - in clinic Wound #3 Right Calcaneus: Change dressing every day. - daily with santyl Wound #5 Right,Dorsal Toe Fifth: Change dressing every other day. Follow-up Appointments: Wound #1 Left Lower Leg: Return Appointment in 1 week. Wound #5 Right,Dorsal Toe Fifth: Return Appointment in 1 week. Wound #2 Right Lower Leg: Return Appointment in 1 week. Wound #3 Right Calcaneus: Return Appointment in 1 week. Edema Control: Wound #1 Left Lower Leg: 3 Layer Compression System - Bilateral Elevate legs to the level of the heart and pump ankles as often as possible Wound #2 Right Lower Leg: 3 Layer Compression System - Bilateral Elevate legs to the level of the heart and pump ankles as often as possible Wound #3 Right Calcaneus: Elevate legs to the level  of the heart and pump ankles as often as possible Off-Loading: Wound #1 Left Lower Leg: Turn and reposition every 2 hours Other: - sage boots Wound #2 Right Lower Leg: Turn and reposition every 2 hours [name], [name] ([id number]) Other: - sage boots Wound #3 Right Calcaneus: Turn and reposition every 2 hours Other: - sage boots General Notes: We have sent an order  for a foot x-ray two times can you please see that this gets done. Pt also needs to see a podiatrist as soon as possible. I have recommended: 1. Santyl ointment locally to his right heel. the areas to tender to debride and once we know his arterial studies he may need a debridement under anesthesia 2. Constant off loading and also using Sage boot. 3. x-ray of the right heel -- we have given another order and reiterated that he should get this done by the time he comes back here next week 4. Bilateral arterial duplex studies of both lower extremities -- appointment pending. 5. bilateral lower extremity Profore lites which he has been using before 6. High-protein diet and multivitamins including vitamin C and zinc. 7. Check with his skilled nursing facility the type of mattress he is using and make appropriate changes as required. 8. See him regularly had the wound center Notes Addendum: he has got an x-ray of the right foot done which shows that his osteopenia but no specific ostial lysis or abnormal periosteal reaction. Final impression was degenerative changes with dorsal foot soft tissue swelling. Electronic Signature(s) Signed: 01/26/2016 2:40:48 PM By: Evlyn Kanner MD, FACS Previous Signature: 01/26/2016 9:51:37 AM Version By: Evlyn Kanner MD, FACS Entered By: Evlyn Kanner on 01/26/2016 14:40:47 [name] ([id number]) -------------------------------------------------------------------------------- SuperBill Details Patient Name: [name] Date of Service: 01/26/2016 Medical Record Number:  [id number] Patient Account Number: [id number] Date of Birth/Sex: 21-Aug-1946 (70 y.o. Male) Treating RN: Phillis Haggis Primary Care Physician: Oretha Milch Other Clinician: Referring Physician: Oretha Milch Treating Physician/Extender: Rudene Re in Treatment: 3 Diagnosis Coding ICD-10 Codes Code Description E11.621 Type 2 diabetes mellitus with foot ulcer L89.613 Pressure ulcer of right heel, stage 3 E66.01 Morbid (severe) obesity due to excess calories I89.0 Lymphedema, not elsewhere classified M70.871 Other soft tissue disorders related to use, overuse and pressure, right ankle and foot Facility Procedures CPT4 Code: 72536644 Description: 11042 - DEB SUBQ TISSUE 20 SQ CM/< ICD-10 Description Diagnosis E11.621 Type 2 diabetes mellitus with foot ulcer L89.613 Pressure ulcer of right heel, stage 3 E66.01 Morbid (severe) obesity due to excess calories I89.0 Lymphedema, not  elsewhere classified Modifier: Quantity: 1 Physician Procedures CPT4: Description Modifier Quantity Code [id number] 99213 - WC PHYS LEVEL 3 - EST PT 25 1 ICD-10 Description Diagnosis E11.621 Type 2 diabetes mellitus with foot ulcer L89.613 Pressure ulcer of right heel, stage 3 E66.01 Morbid (severe) obesity due to  excess calories M70.871 Other soft tissue disorders related to use, overuse and pressure, right ankle and foot CPT4: 9563875 11042 - WC PHYS SUBQ TISS 20 SQ CM 1 ICD-10 Description Diagnosis E11.621 Type 2 diabetes mellitus with foot ulcer [name], [name] ([id number]) Electronic Signature(s) Signed: 01/26/2016 2:41:11 PM By: Evlyn Kanner MD, FACS Previous Signature: 01/26/2016 9:51:48 AM Version By: Evlyn Kanner MD, FACS Entered By: Evlyn Kanner on 01/26/2016 14:41:11

## 2016-01-27 NOTE — Progress Notes (Signed)
BRAHM, BARBEAU ([id number]) Visit Report for 01/26/2016 Arrival Information Details Patient Name: [name], [name] Date of Service: 01/26/2016 8:45 AM Medical Record Number: [id number] Patient Account Number: [id number] Date of Birth/Sex: [birth year] (70 y.o. Male) Treating RN: Phillis Haggis Primary Care Physician: Oretha Milch Other Clinician: Referring Physician: Oretha Milch Treating Physician/Extender: Rudene Re in Treatment: 3 Visit Information History Since Last Visit All ordered tests and consults were completed: No Patient Arrived: Wheel Chair Added or deleted any medications: No Arrival Time: 08:53 Any new allergies or adverse reactions: No Accompanied By: self Had a fall or experienced change in No Transfer Assistance: [employer] activities of daily living that may affect Patient Lift risk of falls: Patient Identification Verified: Yes Signs or symptoms of abuse/neglect since last No Secondary Verification Process Yes visito Completed: Hospitalized since last visit: No Patient Requires Transmission- No Pain Present Now: No Based Precautions: Patient Has Alerts: Yes Patient Alerts: DM II Electronic Signature(s) Signed: 01/26/2016 3:06:35 PM By: Alejandro Mulling Entered By: Alejandro Mulling on 01/26/2016 08:56:57 [name] ([id number]) -------------------------------------------------------------------------------- Encounter Discharge Information Details Patient Name: [name] Date of Service: 01/26/2016 8:45 AM Medical Record Number: [id number] Patient Account Number: [id number] Date of Birth/Sex: 11-03-1946 (70 y.o. Male) Treating RN: Phillis Haggis Primary Care Physician: Oretha Milch Other Clinician: Referring Physician: Oretha Milch Treating Physician/Extender: Rudene Re in Treatment: 3 Encounter Discharge Information Items Discharge Pain Level: 0 Discharge Condition: Stable Ambulatory Status: Wheelchair Discharge  Destination: Nursing Home Transportation: Other Accompanied By: self Schedule Follow-up Appointment: Yes Medication Reconciliation completed and provided to Patient/Care Yes Corby Villasenor: Provided on Clinical Summary of Care: 01/26/2016 Form Type Recipient Paper Patient EL Electronic Signature(s) Signed: 01/26/2016 3:06:35 PM By: Alejandro Mulling Previous Signature: 01/26/2016 10:16:04 AM Version By: Francie Massing Entered By: Alejandro Mulling on 01/26/2016 11:24:58 [name] ([id number]) -------------------------------------------------------------------------------- Lower Extremity Assessment Details Patient Name: [name] Date of Service: 01/26/2016 8:45 AM Medical Record Number: [id number] Patient Account Number: [id number] Date of Birth/Sex: [birth year] (70 y.o. Male) Treating RN: Phillis Haggis Primary Care Physician: Oretha Milch Other Clinician: Referring Physician: Oretha Milch Treating Physician/Extender: Rudene Re in Treatment: 3 Edema Assessment Assessed: [Left: No] [Right: No] E[Left: dema] [Right: :] Calf Left: Right: Point of Measurement: 34 cm From Medial Instep 34 cm 33 cm Ankle Left: Right: Point of Measurement: 12 cm From Medial Instep 25.6 cm 24.6 cm Vascular Assessment Pulses: Posterior Tibial Extremity colors, hair growth, and conditions: Extremity Color: [Left:Hyperpigmented] [Right:Hyperpigmented] Temperature of Extremity: [Left:Warm] [Right:Warm] Capillary Refill: [Left:< 3 seconds] [Right:< 3 seconds] Toe Nail Assessment Left: Right: Thick: Yes Yes Discolored: Yes Yes Deformed: Yes Yes Improper Length and Hygiene: Yes Yes Electronic Signature(s) Signed: 01/26/2016 3:06:35 PM By: Alejandro Mulling Entered By: Alejandro Mulling on 01/26/2016 09:01:38 [name] ([id number]) -------------------------------------------------------------------------------- Multi Wound Chart Details Patient Name: [name] Date of  Service: 01/26/2016 8:45 AM Medical Record Number: [id number] Patient Account Number: [id number] Date of Birth/Sex: Aug 18, 1946 (70 y.o. Male) Treating RN: Phillis Haggis Primary Care Physician: Oretha Milch Other Clinician: Referring Physician: Oretha Milch Treating Physician/Extender: Rudene Re in Treatment: 3 Vital Signs Height(in): 70 Pulse(bpm): 75 Weight(lbs): 235 Blood Pressure 127/57 (mmHg): Body Mass Index(BMI): 34 Temperature(F): 97.5 Respiratory Rate 20 (breaths/min): Photos: [1:No Photos] [2:No Photos] [3:No Photos] Wound Location: [1:Left Lower Leg] [2:Right Lower Leg] [3:Right Calcaneus] Wounding Event: [1:Gradually Appeared] [2:Gradually Appeared] [3:Pressure Injury] Primary Etiology: [1:Venous Leg Ulcer] [2:Venous Leg Ulcer] [3:Pressure Ulcer] Comorbid History: [1:Cataracts, Chronic Obstructive Pulmonary Disease (COPD), Sleep Disease (COPD),  Sleep Disease (COPD), Sleep Apnea, Angina, Congestive Heart Failure, Congestive Heart Failure, Congestive Heart Failure, Hypertension, Type II Diabetes,  Gout, Osteoarthritis, Neuropathy Osteoarthritis, Neuropathy Osteoarthritis, Neuropathy] [2:Cataracts, Chronic Obstructive Pulmonary Apnea, Angina, Hypertension, Type II Diabetes, Gout,] [3:Cataracts, Chronic Obstructive Pulmonary Apnea, Angina,  Hypertension, Type II Diabetes, Gout,] Date Acquired: [1:12/31/2014] [2:12/31/2014] [3:10/31/2015] Weeks of Treatment: [1:3] [2:3] [3:3] Wound Status: [1:Open] [2:Open] [3:Open] Measurements L x W x D 9x4x0.1 [2:6x11x0.1] [3:2.3x4.3x0.2] (cm) Area (cm) : [1:28.274] [2:51.836] [3:7.768] Volume (cm) : [1:2.827] [2:5.184] [3:1.554] % Reduction in Area: [1:-15.40%] [2:-21864.40%] [3:-9.90%] % Reduction in Volume: -15.40% [2:-21500.00%] [3:-9.90%] Classification: [1:Partial Thickness] [2:Partial Thickness] [3:Category/Stage III] HBO Classification: [1:Grade 1] [2:Grade 1] [3:Grade 1] Exudate Amount: [1:Large] [2:Large]  [3:Large] Exudate Type: [1:Serosanguineous] [2:Serosanguineous] [3:Serosanguineous] Exudate Color: [1:red, brown] [2:red, brown] [3:red, brown] Wound Margin: [1:Flat and Intact] [2:Flat and Intact] [3:Flat and Intact] Granulation Amount: [1:Medium (34-66%)] [2:Small (1-33%)] [3:Small (1-33%)] Granulation Quality: [1:Red, Pink] [2:Red, Pink] [3:Red, Pink] Necrotic Amount: [1:Medium (34-66%)] [2:Large (67-100%)] [3:Large (67-100%)] Necrotic Tissue: Adherent Slough Adherent [employer], Adherent Slough Exposed Structures: Fascia: No Fascia: No Fascia: No Fat: No Fat: No Fat: No Tendon: No Tendon: No Tendon: No Muscle: No Muscle: No Muscle: No Joint: No Joint: No Joint: No Bone: No Bone: No Bone: No Limited to Skin Limited to Skin Limited to Skin Breakdown Breakdown Breakdown Epithelialization: None None None Periwound Skin Texture: Edema: Yes Edema: Yes Edema: Yes Periwound Skin Moist: Yes Moist: Yes Maceration: Yes Moisture: Moist: Yes Periwound Skin Color: Erythema: Yes Erythema: Yes Erythema: Yes Erythema Location: Circumferential Circumferential Circumferential Temperature: No Abnormality No Abnormality No Abnormality Tenderness on Yes Yes Yes Palpation: Wound Preparation: Ulcer Cleansing: Other: Ulcer Cleansing: Other: Ulcer Cleansing: soap and water soap and water Rinsed/Irrigated with Saline Topical Anesthetic Topical Anesthetic Applied: Other: lidocaine Applied: Other: lidocaine Topical Anesthetic 4% 4% Applied: Other: lidocaine 4% Treatment Notes Electronic Signature(s) Signed: 01/26/2016 3:06:35 PM By: Alejandro Mulling Entered By: Alejandro Mulling on 01/26/2016 09:23:12 [name] ([id number]) -------------------------------------------------------------------------------- Multi-Disciplinary Care Plan Details Patient Name: [name] Date of Service: 01/26/2016 8:45 AM Medical Record Number: [id number] Patient Account Number:  [id number] Date of Birth/Sex: Mar 19, 1946 (70 y.o. Male) Treating RN: Phillis Haggis Primary Care Physician: Oretha Milch Other Clinician: Referring Physician: Oretha Milch Treating Physician/Extender: Rudene Re in Treatment: 3 Active Inactive Electronic Signature(s) Signed: 01/26/2016 3:06:35 PM By: Alejandro Mulling Entered By: Alejandro Mulling on 01/26/2016 09:22:48 [name] ([id number]) -------------------------------------------------------------------------------- Pain Assessment Details Patient Name: [name] Date of Service: 01/26/2016 8:45 AM Medical Record Number: [id number] Patient Account Number: [id number] Date of Birth/Sex: [birth year] (70 y.o. Male) Treating RN: Phillis Haggis Primary Care Physician: Oretha Milch Other Clinician: Referring Physician: Oretha Milch Treating Physician/Extender: Rudene Re in Treatment: 3 Active Problems Location of Pain Severity and Description of Pain Patient Has Paino No Site Locations Pain Management and Medication Current Pain Management: Electronic Signature(s) Signed: 01/26/2016 3:06:35 PM By: Alejandro Mulling Entered By: Alejandro Mulling on 01/26/2016 08:57:02 [name] ([id number]) -------------------------------------------------------------------------------- Patient/Caregiver Education Details Patient Name: [name] Date of Service: 01/26/2016 8:45 AM Medical Record Number: [id number] Patient Account Number: [id number] Date of Birth/Gender: [birth year] (70 y.o. Male) Treating RN: Phillis Haggis Primary Care Physician: Oretha Milch Other Clinician: Referring Physician: Oretha Milch Treating Physician/Extender: Rudene Re in Treatment: 3 Education Assessment Education Provided To: Patient Education Topics Provided Wound/Skin Impairment: Handouts: Other: do not get wraps wet Methods: Demonstration, Explain/Verbal Responses: State content correctly Electronic  Signature(s) Signed: 01/26/2016 3:06:35 PM By: Ashok Cordia,  Debra Entered By: Alejandro Mulling on 01/26/2016 11:25:36 [name] ([id number]) -------------------------------------------------------------------------------- Wound Assessment Details Patient Name: [name] Date of Service: 01/26/2016 8:45 AM Medical Record Number: [id number] Patient Account Number: [id number] Date of Birth/Sex: 06-22-1946 (70 y.o. Male) Treating RN: Phillis Haggis Primary Care Physician: Oretha Milch Other Clinician: Referring Physician: Oretha Milch Treating Physician/Extender: Rudene Re in Treatment: 3 Wound Status Wound Number: 1 Primary Venous Leg Ulcer Etiology: Wound Location: Left Lower Leg Wound Open Wounding Event: Gradually Appeared Status: Date Acquired: 12/31/2014 Comorbid Cataracts, Chronic Obstructive Weeks Of Treatment: 3 History: Pulmonary Disease (COPD), Sleep Clustered Wound: No Apnea, Angina, Congestive Heart Failure, Hypertension, Type II Diabetes, Gout, Osteoarthritis, Neuropathy Photos Photo Uploaded By: Alejandro Mulling on 01/26/2016 14:45:23 Wound Measurements Length: (cm) 9 Width: (cm) 4 Depth: (cm) 0.1 Area: (cm) 28.274 Volume: (cm) 2.827 % Reduction in Area: -15.4% % Reduction in Volume: -15.4% Epithelialization: None Tunneling: No Undermining: No Wound Description Classification: Partial Thickness Foul Odor Af Diabetic Severity (Wagner): Grade 1 Wound Margin: Flat and Intact Exudate Amount: Large Exudate Type: Serosanguineous Exudate Color: red, brown ter Cleansing: No Wound Bed Granulation Amount: Medium (34-66%) Exposed Structure Osterloh, [name] ([id number]) Granulation Quality: Red, Pink Fascia Exposed: No Necrotic Amount: Medium (34-66%) Fat Layer Exposed: No Necrotic Quality: Adherent Slough Tendon Exposed: No Muscle Exposed: No Joint Exposed: No Bone Exposed: No Limited to Skin Breakdown Periwound Skin  Texture Texture Color No Abnormalities Noted: No No Abnormalities Noted: No Localized Edema: Yes Erythema: Yes Erythema Location: Circumferential Moisture No Abnormalities Noted: No Temperature / Pain Moist: Yes Temperature: No Abnormality Tenderness on Palpation: Yes Wound Preparation Ulcer Cleansing: Other: soap and water, Topical Anesthetic Applied: Other: lidocaine 4%, Treatment Notes Wound #1 (Left Lower Leg) 1. Cleansed with: Cleanse wound with antibacterial soap and water 2. Anesthetic Topical Lidocaine 4% cream to wound bed prior to debridement 3. Peri-wound Care: Moisturizing lotion 4. Dressing Applied: Aquacel Ag 5. Secondary Dressing Applied ABD Pad 7. Secured with Tape 3 Layer Compression System - Bilateral Electronic Signature(s) Signed: 01/26/2016 3:06:35 PM By: Alejandro Mulling Entered By: Alejandro Mulling on 01/26/2016 09:20:03 [name] ([id number]) -------------------------------------------------------------------------------- Wound Assessment Details Patient Name: [name] Date of Service: 01/26/2016 8:45 AM Medical Record Number: [id number] Patient Account Number: [id number] Date of Birth/Sex: 01-12-1946 (70 y.o. Male) Treating RN: Phillis Haggis Primary Care Physician: Oretha Milch Other Clinician: Referring Physician: Oretha Milch Treating Physician/Extender: Rudene Re in Treatment: 3 Wound Status Wound Number: 2 Primary Venous Leg Ulcer Etiology: Wound Location: Right Lower Leg Wound Open Wounding Event: Gradually Appeared Status: Date Acquired: 12/31/2014 Comorbid Cataracts, Chronic Obstructive Weeks Of Treatment: 3 History: Pulmonary Disease (COPD), Sleep Clustered Wound: No Apnea, Angina, Congestive Heart Failure, Hypertension, Type II Diabetes, Gout, Osteoarthritis, Neuropathy Photos Photo Uploaded By: Alejandro Mulling on 01/26/2016 14:45:44 Wound Measurements Length: (cm) 6 Width: (cm) 11 Depth: (cm)  0.1 Area: (cm) 51.836 Volume: (cm) 5.184 % Reduction in Area: -21864.4% % Reduction in Volume: -21500% Epithelialization: None Tunneling: No Undermining: No Wound Description Classification: Partial Thickness Foul Odor A Diabetic Severity (Wagner): Grade 1 Wound Margin: Flat and Intact Exudate Amount: Large Exudate Type: Serosanguineous Exudate Color: red, brown fter Cleansing: No Wound Bed Granulation Amount: Small (1-33%) Exposed Structure [name], [name] ([id number]) Granulation Quality: Red, Pink Fascia Exposed: No Necrotic Amount: Large (67-100%) Fat Layer Exposed: No Necrotic Quality: Adherent Slough Tendon Exposed: No Muscle Exposed: No Joint Exposed: No Bone Exposed: No Limited to Skin Breakdown Periwound Skin Texture Texture Color No Abnormalities  Noted: No No Abnormalities Noted: No Localized Edema: Yes Erythema: Yes Erythema Location: Circumferential Moisture No Abnormalities Noted: No Temperature / Pain Moist: Yes Temperature: No Abnormality Tenderness on Palpation: Yes Wound Preparation Ulcer Cleansing: Other: soap and water, Topical Anesthetic Applied: Other: lidocaine 4%, Treatment Notes Wound #2 (Right Lower Leg) 1. Cleansed with: Cleanse wound with antibacterial soap and water 2. Anesthetic Topical Lidocaine 4% cream to wound bed prior to debridement 3. Peri-wound Care: Moisturizing lotion 4. Dressing Applied: Aquacel Ag 5. Secondary Dressing Applied ABD Pad 7. Secured with Tape 3 Layer Compression System - Bilateral Electronic Signature(s) Signed: 01/26/2016 3:06:35 PM By: Alejandro Mulling Entered By: Alejandro Mulling on 01/26/2016 09:19:26 [name] ([id number]) -------------------------------------------------------------------------------- Wound Assessment Details Patient Name: [name] Date of Service: 01/26/2016 8:45 AM Medical Record Number: [id number] Patient Account Number: [id number] Date of Birth/Sex:  04-09-1946 (70 y.o. Male) Treating RN: Phillis Haggis Primary Care Physician: Oretha Milch Other Clinician: Referring Physician: Oretha Milch Treating Physician/Extender: Rudene Re in Treatment: 3 Wound Status Wound Number: 3 Primary Pressure Ulcer Etiology: Wound Location: Right Calcaneus Wound Open Wounding Event: Pressure Injury Status: Date Acquired: 10/31/2015 Comorbid Cataracts, Chronic Obstructive Weeks Of Treatment: 3 History: Pulmonary Disease (COPD), Sleep Clustered Wound: No Apnea, Angina, Congestive Heart Failure, Hypertension, Type II Diabetes, Gout, Osteoarthritis, Neuropathy Photos Photo Uploaded By: Alejandro Mulling on 01/26/2016 14:45:32 Wound Measurements Length: (cm) 2.3 Width: (cm) 4.3 Depth: (cm) 0.2 Area: (cm) 7.768 Volume: (cm) 1.554 % Reduction in Area: -9.9% % Reduction in Volume: -9.9% Epithelialization: None Tunneling: No Undermining: No Wound Description Classification: Category/Stage III Foul Odor Afte Diabetic Severity (Wagner): Grade 1 Wound Margin: Flat and Intact Exudate Amount: Large Exudate Type: Serosanguineous Exudate Color: red, brown r Cleansing: No Wound Bed Granulation Amount: Small (1-33%) Exposed Structure [name], [name] ([id number]) Granulation Quality: Red, Pink Fascia Exposed: No Necrotic Amount: Large (67-100%) Fat Layer Exposed: No Necrotic Quality: Eschar, Adherent Slough Tendon Exposed: No Muscle Exposed: No Joint Exposed: No Bone Exposed: No Limited to Skin Breakdown Periwound Skin Texture Texture Color No Abnormalities Noted: No No Abnormalities Noted: No Localized Edema: Yes Erythema: Yes Erythema Location: Circumferential Moisture No Abnormalities Noted: No Temperature / Pain Maceration: Yes Temperature: No Abnormality Moist: Yes Tenderness on Palpation: Yes Wound Preparation Ulcer Cleansing: Rinsed/Irrigated with Saline Topical Anesthetic Applied: Other: lidocaine  4%, Treatment Notes Wound #3 (Right Calcaneus) 1. Cleansed with: Cleanse wound with antibacterial soap and water 2. Anesthetic Topical Lidocaine 4% cream to wound bed prior to debridement 3. Peri-wound Care: Skin Prep 4. Dressing Applied: Santyl Ointment 5. Secondary Dressing Applied Foam Guaze, ABD and kerlix/Conform 7. Secured with Secretary/administrator) Signed: 01/26/2016 3:06:35 PM By: Alejandro Mulling Entered By: Alejandro Mulling on 01/26/2016 09:17:54 [name] ([id number]) -------------------------------------------------------------------------------- Wound Assessment Details Patient Name: [name] Date of Service: 01/26/2016 8:45 AM Medical Record Number: [id number] Patient Account Number: [id number] Date of Birth/Sex: 1945/12/13 (70 y.o. Male) Treating RN: Phillis Haggis Primary Care Physician: Oretha Milch Other Clinician: Referring Physician: Oretha Milch Treating Physician/Extender: Rudene Re in Treatment: 3 Wound Status Wound Number: 5 Primary Trauma, Other Etiology: Wound Location: Right Toe Fifth - Dorsal Wound Open Wounding Event: Trauma Status: Date Acquired: 01/26/2016 Comorbid Cataracts, Chronic Obstructive Weeks Of Treatment: 0 History: Pulmonary Disease (COPD), Sleep Clustered Wound: No Apnea, Angina, Congestive Heart Failure, Hypertension, Type II Diabetes, Gout, Osteoarthritis, Neuropathy Photos Photo Uploaded By: Alejandro Mulling on 01/26/2016 14:45:45 Wound Measurements Length: (cm) 0.7 Width: (cm) 0.4 Depth: (cm) 0.1 Area: (cm) 0.22  Volume: (cm) 0.022 % Reduction in Area: % Reduction in Volume: Epithelialization: None Tunneling: No Undermining: No Wound Description Classification: Partial Thickness Foul Odor A Diabetic Severity (Wagner): Grade 1 Wound Margin: Flat and Intact Exudate Amount: Medium Exudate Type: Serosanguineous Exudate Color: red, brown fter Cleansing: No Wound Bed Granulation  Amount: Large (67-100%) Exposed Structure [name], [name] ([id number]) Granulation Quality: Red, Pink Fascia Exposed: No Necrotic Amount: None Present (0%) Fat Layer Exposed: No Tendon Exposed: No Muscle Exposed: No Joint Exposed: No Bone Exposed: No Limited to Skin Breakdown Periwound Skin Texture Texture Color No Abnormalities Noted: No No Abnormalities Noted: No Moisture No Abnormalities Noted: No Moist: Yes Wound Preparation Ulcer Cleansing: Rinsed/Irrigated with Saline Topical Anesthetic Applied: None Treatment Notes Wound #5 (Right, Dorsal Toe Fifth) 1. Cleansed with: Clean wound with Normal Saline 2. Anesthetic Topical Lidocaine 4% cream to wound bed prior to debridement 4. Dressing Applied: Aquacel Ag 5. Secondary Dressing Applied Kerlix/Conform 7. Secured with Secretary/administratorTape Electronic Signature(s) Signed: 01/26/2016 3:06:35 PM By: Alejandro MullingPinkerton, Debra Entered By: Alejandro MullingPinkerton, Debra on 01/26/2016 09:44:36 [name], [name] ([id number]) -------------------------------------------------------------------------------- Vitals Details Patient Name: [name], [name] Date of Service: 01/26/2016 8:45 AM Medical Record Number: [id number] Patient Account Number: [id number] Date of Birth/Sex: [birth year](70 y.o. Male) Treating RN: Phillis HaggisPinkerton, Debi Primary Care Physician: Oretha MilchSMITH, [name] Other Clinician: Referring Physician: Oretha MilchSMITH, [name] Treating Physician/Extender: Rudene ReBritto, Errol Weeks in Treatment: 3 Vital Signs Time Taken: 08:57 Temperature (F): 97.5 Height (in): 70 Pulse (bpm): 75 Weight (lbs): 235 Respiratory Rate (breaths/min): 20 Body Mass Index (BMI): 33.7 Blood Pressure (mmHg): 127/57 Reference Range: 80 - 120 mg / dl Electronic Signature(s) Signed: 01/26/2016 3:06:35 PM By: Alejandro MullingPinkerton, Debra Entered By: Alejandro MullingPinkerton, Debra on 01/26/2016 08:57:54

## 2016-02-02 ENCOUNTER — Ambulatory Visit: Admitting: General Surgery

## 2016-02-03 NOTE — Progress Notes (Signed)
[name], Demico V. ([id number]) Visit Report for 02/02/2016 Chief Complaint Document Details Patient Name: [name], [name] Date of Service: 02/02/2016 8:45 AM Medical Record Number: [id number] Patient Account Number: [id number] Date of Birth/Sex: Jul 05, 1946 [age] y.o. Male) Treating RN: Phillis HaggisPinkerton, Debi Primary Care Physician: Oretha MilchSMITH, [name] Other Clinician: Referring Physician: Oretha MilchSMITH, [name] Treating Physician/Extender: Elayne SnarePARKER, Justen Fonda Weeks in Treatment: 4 Information Obtained from: Patient Chief Complaint Patient is at the clinic for treatment of an open pressure ulcer the left upper thigh and gluteal region and the right heel with bilateral swelling of the legs all of it which is going on for over a year Electronic Signature(s) Signed: 02/02/2016 8:53:42 AM By: Ardath SaxParker, Natashia Roseman MD Entered By: Ardath SaxParker, Niyam Bisping on 02/02/2016 08:53:42 [name], [name] ([id number]) -------------------------------------------------------------------------------- HPI Details Patient Name: [name], [name] Date of Service: 02/02/2016 8:45 AM Medical Record Number: [id number] Patient Account Number: [id number] Date of Birth/Sex: Jul 05, 1946 [age] y.o. Male) Treating RN: Phillis HaggisPinkerton, Debi Primary Care Physician: Oretha MilchSMITH, [name] Other Clinician: Referring Physician: Oretha MilchSMITH, [name] Treating Physician/Extender: Ardath SaxPARKER, Jagar Lua Weeks in Treatment: 4 History of Present Illness Location: ulcerated area on the right heel, left gluteal region and thigh and then bilateral lower extremities Quality: Patient reports experiencing a sharp pain to affected area(s). Severity: Patient states wound are getting worse. Duration: Patient has had the wound for > 12 months prior to seeking treatment at the wound center Timing: Pain in wound is constant (hurts all the time) Context: The wound appeared gradually over time Modifying Factors: Other treatment(s) tried include: he sees his heart doctor and his primary care doctor Associated Signs and Symptoms:  patient has not been able to walk for over a year now HPI Description: 70 year old gentleman with a known history of hypertension, diabetes, obstructive sleep apnea, COPD, diastolic CHF, coronary artery disease was admitted to the hospital with sepsis from an ulcer of the right heel and was treated there in October 2016. he also has chronic bilateral lower extremity edema and lymphedema. He had received vancomycin, Zosyn and at that stage and x-rays showed hardware in the right ankle but no evidence of osteomyelitis. he was a former smoker. he was also treated with Augmentin orally for 10 days. He is either bedbound or wheelchair-bound and does not ambulate by himself. 01/19/2016 -- he has not been seen here for 3 weeks and this was because he was admitted to [facility] between 223 and 01/08/2016 for sepsis, UTI and pneumonia involving the left lung. he was treated with IV vancomycin and Zosyn and then meropenem. Was discharged home on oral Bactrim for 2 weeks none of his vascular test or x-rays were done and we will reorder these. 01/26/2016 -- he has not yet done the x-ray of his foot and is vascular tests are still pending. I have asked him to work on these with his nursing home staff. Addendum: he has got an x-ray of the right foot done which shows that his osteopenia but no specific ostial lysis or abnormal periosteal reaction. Final impression was degenerative changes with dorsal foot soft tissue swelling. Electronic Signature(s) Signed: 02/02/2016 8:57:05 AM By: Ardath SaxParker, Keiran Sias MD Entered By: Ardath SaxParker, Tiana Sivertson on 02/02/2016 08:57:05 [name], [name] ([id number]) -------------------------------------------------------------------------------- Physical Exam Details Patient Name: [name], [name] Date of Service: 02/02/2016 8:45 AM Medical Record Number: [id number] Patient Account Number: [id number] Date of Birth/Sex: Jul 05, 1946 [age] y.o. Male) Treating RN: Phillis HaggisPinkerton,  Debi Primary Care Physician: Oretha MilchSMITH, [name] Other Clinician: Referring Physician: Oretha MilchSMITH, [name] Treating Physician/Extender: Elayne SnarePARKER, Eileene Kisling Weeks in Treatment: 4 Electronic  Signature(s) Signed: 02/02/2016 8:58:55 AM By: Ardath Sax MD Entered By: Ardath Sax on 02/02/2016 08:58:55 [name] ([id number]) -------------------------------------------------------------------------------- Problem List Details Patient Name: [name] Date of Service: 02/02/2016 8:45 AM Medical Record Number: [id number] Patient Account Number: [id number] Date of Birth/Sex: 07/22/1946 (70 y.o. Male) Treating RN: Phillis Haggis Primary Care Physician: Oretha Milch Other Clinician: Referring Physician: Oretha Milch Treating Physician/Extender: Elayne Snare in Treatment: 4 Active Problems ICD-10 Encounter Code Description Active Date Diagnosis E11.621 Type 2 diabetes mellitus with foot ulcer 01/01/2016 Yes L89.613 Pressure ulcer of right heel, stage 3 01/01/2016 Yes E66.01 Morbid (severe) obesity due to excess calories 01/01/2016 Yes I89.0 Lymphedema, not elsewhere classified 01/01/2016 Yes M70.871 Other soft tissue disorders related to use, overuse and 01/19/2016 Yes pressure, right ankle and foot Inactive Problems Resolved Problems ICD-10 Code Description Active Date Resolved Date L89.322 Pressure ulcer of left buttock, stage 2 01/01/2016 01/01/2016 Electronic Signature(s) Signed: 02/02/2016 8:52:58 AM By: Ardath Sax MD Entered By: Ardath Sax on 02/02/2016 08:52:58

## 2016-02-16 ENCOUNTER — Encounter: Payer: Medicare Other | Attending: Surgery | Admitting: Surgery

## 2016-02-16 DIAGNOSIS — Z87891 Personal history of nicotine dependence: Secondary | ICD-10-CM | POA: Diagnosis not present

## 2016-02-16 DIAGNOSIS — E1140 Type 2 diabetes mellitus with diabetic neuropathy, unspecified: Secondary | ICD-10-CM | POA: Diagnosis not present

## 2016-02-16 DIAGNOSIS — J449 Chronic obstructive pulmonary disease, unspecified: Secondary | ICD-10-CM | POA: Insufficient documentation

## 2016-02-16 DIAGNOSIS — I2510 Atherosclerotic heart disease of native coronary artery without angina pectoris: Secondary | ICD-10-CM | POA: Diagnosis not present

## 2016-02-16 DIAGNOSIS — I70234 Atherosclerosis of native arteries of right leg with ulceration of heel and midfoot: Secondary | ICD-10-CM | POA: Diagnosis not present

## 2016-02-16 DIAGNOSIS — L89613 Pressure ulcer of right heel, stage 3: Secondary | ICD-10-CM | POA: Diagnosis not present

## 2016-02-16 DIAGNOSIS — G4733 Obstructive sleep apnea (adult) (pediatric): Secondary | ICD-10-CM | POA: Insufficient documentation

## 2016-02-16 DIAGNOSIS — L97821 Non-pressure chronic ulcer of other part of left lower leg limited to breakdown of skin: Secondary | ICD-10-CM | POA: Diagnosis not present

## 2016-02-16 DIAGNOSIS — I110 Hypertensive heart disease with heart failure: Secondary | ICD-10-CM | POA: Insufficient documentation

## 2016-02-16 DIAGNOSIS — I5032 Chronic diastolic (congestive) heart failure: Secondary | ICD-10-CM | POA: Insufficient documentation

## 2016-02-16 DIAGNOSIS — M70871 Other soft tissue disorders related to use, overuse and pressure, right ankle and foot: Secondary | ICD-10-CM | POA: Insufficient documentation

## 2016-02-16 DIAGNOSIS — M1990 Unspecified osteoarthritis, unspecified site: Secondary | ICD-10-CM | POA: Diagnosis not present

## 2016-02-16 DIAGNOSIS — E11621 Type 2 diabetes mellitus with foot ulcer: Secondary | ICD-10-CM | POA: Insufficient documentation

## 2016-02-16 DIAGNOSIS — I890 Lymphedema, not elsewhere classified: Secondary | ICD-10-CM | POA: Insufficient documentation

## 2016-02-16 DIAGNOSIS — M109 Gout, unspecified: Secondary | ICD-10-CM | POA: Insufficient documentation

## 2016-02-16 DIAGNOSIS — Z6833 Body mass index (BMI) 33.0-33.9, adult: Secondary | ICD-10-CM | POA: Diagnosis not present

## 2016-02-17 NOTE — Progress Notes (Addendum)
EYTAN, CARRIGAN ([id number]) Visit Report for 02/16/2016 Chief Complaint Document Details Patient Name: [name], [name] Date of Service: 02/16/2016 1:30 PM Medical Record Number: [id number] Patient Account Number: [id number] Date of Birth/Sex: 05/03/1946 (70 y.o. Male) Treating RN: Phillis Haggis Primary Care Physician: Oretha Milch Other Clinician: Referring Physician: Oretha Milch Treating Physician/Extender: Rudene Re in Treatment: 6 Information Obtained from: Patient Chief Complaint Patient is at the clinic for treatment of an open pressure ulcer the left upper thigh and gluteal region and the right heel with bilateral swelling of the legs all of it which is going on for over a year Electronic Signature(s) Signed: 02/16/2016 2:21:49 PM By: Evlyn Kanner MD, FACS Entered By: Evlyn Kanner on 02/16/2016 14:21:49 [name] ([id number]) -------------------------------------------------------------------------------- Debridement Details Patient Name: [name] Date of Service: 02/16/2016 1:30 PM Medical Record Number: [id number] Patient Account Number: [id number] Date of Birth/Sex: Dec 16, 1945 (70 y.o. Male) Treating RN: Phillis Haggis Primary Care Physician: Oretha Milch Other Clinician: Referring Physician: Oretha Milch Treating Physician/Extender: Rudene Re in Treatment: 6 Debridement Performed for Wound #3 Right Calcaneus Assessment: Performed By: Physician Evlyn Kanner, MD Debridement: Debridement Pre-procedure Yes Verification/Time Out Taken: Start Time: 13:58 Pain Control: Other : lidocaine 4% cream Level: Skin/Subcutaneous Tissue Total Area Debrided (L x 2.4 (cm) x 3.7 (cm) = 8.88 (cm) W): Tissue and other Viable, Non-Viable, Exudate, Fibrin/Slough, Subcutaneous material debrided: Instrument: Blade, Forceps Bleeding: Minimum Hemostasis Achieved: Pressure End Time: 14:01 Procedural Pain: 0 Post Procedural Pain: 0 Response to  Treatment: Procedure was tolerated well Post Debridement Measurements of Total Wound Length: (cm) 2.4 Stage: Category/Stage III Width: (cm) 3.4 Depth: (cm) 0.4 Volume: (cm) 2.564 Post Procedure Diagnosis Same as Pre-procedure Electronic Signature(s) Signed: 02/16/2016 2:21:42 PM By: Evlyn Kanner MD, FACS Signed: 02/20/2016 4:14:54 PM By: Alejandro Mulling Entered By: Evlyn Kanner on 02/16/2016 14:21:42 [name], [name] ([id number]) -------------------------------------------------------------------------------- HPI Details Patient Name: [name] Date of Service: 02/16/2016 1:30 PM Medical Record Number: [id number] Patient Account Number: [id number] Date of Birth/Sex: 03-Sep-1946 (70 y.o. Male) Treating RN: Phillis Haggis Primary Care Physician: Oretha Milch Other Clinician: Referring Physician: Oretha Milch Treating Physician/Extender: Rudene Re in Treatment: 6 History of Present Illness Location: ulcerated area on the right heel, left gluteal region and thigh and then bilateral lower extremities Quality: Patient reports experiencing a sharp pain to affected area(s). Severity: Patient states wound are getting worse. Duration: Patient has had the wound for > 12 months prior to seeking treatment at the wound center Timing: Pain in wound is constant (hurts all the time) Context: The wound appeared gradually over time Modifying Factors: Other treatment(s) tried include: he sees his heart doctor and his primary care doctor Associated Signs and Symptoms: patient has not been able to walk for over a year now HPI Description: 70 year old gentleman with a known history of hypertension, diabetes, obstructive sleep apnea, COPD, diastolic CHF, coronary artery disease was admitted to the hospital with sepsis from an ulcer of the right heel and was treated there in October 2016. he also has chronic bilateral lower extremity edema and lymphedema. He had received vancomycin, Zosyn  and at that stage and x-rays showed hardware in the right ankle but no evidence of osteomyelitis. he was a former smoker. he was also treated with Augmentin orally for 10 days. He is either bedbound or wheelchair-bound and does not ambulate by himself. 01/19/2016 -- he has not been seen here for 3 weeks and this was because he was admitted to [facility] between 223 and 01/08/2016 for sepsis, UTI and pneumonia involving the left lung. he was treated with IV vancomycin and Zosyn and then meropenem. Was discharged home on oral Bactrim for 2 weeks none of his vascular test or x-rays were done and we will reorder these. 01/26/2016 -- he has not yet done the x-ray of his foot and is vascular tests are still pending. I have asked him to work on these with his nursing home staff. Addendum: he has got an x-ray of the right foot done which shows that his osteopenia but no specific ostial lysis or abnormal periosteal reaction. Final impression was degenerative changes with dorsal foot soft tissue swelling. 02/16/2016 -- lower extremity arterial duplex examination shows a 50-99% stenosis of the right tibioperoneal trunk. He had biphasic flow in the right SFA, popliteal and tibioperoneal trunk. Left-sided he had triphasic flow throughout Electronic Signature(s) Signed: 02/16/2016 2:21:58 PM By: Evlyn Kanner MD, FACS Previous Signature: 02/16/2016 1:53:41 PM Version By: Evlyn Kanner MD, FACS Entered By: Evlyn Kanner on 02/16/2016 14:21:58 [name] ([id number]) -------------------------------------------------------------------------------- Physical Exam Details Patient Name: [name] Date of Service: 02/16/2016 1:30 PM Medical Record Number: [id number] Patient Account Number: [id number] Date of Birth/Sex: [birth year] (70 y.o. Male) Treating RN: Phillis Haggis Primary Care Physician: Oretha Milch Other Clinician: Referring Physician: Oretha Milch Treating  Physician/Extender: Rudene Re in Treatment: 6 Constitutional . Pulse regular. Respirations normal and unlabored. Afebrile. . Eyes Nonicteric. Reactive to light. Ears, Nose, Mouth, and Throat Lips, teeth, and gums WNL.Marland Kitchen Moist mucosa without lesions. Neck supple and nontender. No palpable supraclavicular or cervical adenopathy. Normal sized without goiter. Respiratory WNL. No retractions.. Breath sounds WNL, No rubs, rales, rhonchi, or wheeze.. Cardiovascular Heart rhythm and rate regular, no murmur or gallop.. Pedal Pulses WNL. No clubbing, cyanosis or edema. Lymphatic No adneopathy. No adenopathy. No adenopathy. Musculoskeletal Adexa without tenderness or enlargement.. Digits and nails w/o clubbing, cyanosis, infection, petechiae, ischemia, or inflammatory conditions.. Integumentary (Hair, Skin) No suspicious lesions. No crepitus or fluctuance. No peri-wound warmth or erythema. No masses.Marland Kitchen Psychiatric Judgement and insight Intact.. No evidence of depression, anxiety, or agitation.. Notes the wounds on both lower legs are looking fairly good. The right heel has a lot of subcutaneous eschar and debris and this was sharply dissected with a 15 number blade and a forcep. Bleeding was controlled with pressure. Electronic Signature(s) Signed: 02/16/2016 2:22:38 PM By: Evlyn Kanner MD, FACS Entered By: Evlyn Kanner on 02/16/2016 14:22:38 [name] ([id number]) -------------------------------------------------------------------------------- Physician Orders Details Patient Name: [name] Date of Service: 02/16/2016 1:30 PM Medical Record Number: [id number] Patient Account Number: [id number] Date of Birth/Sex: 1946/06/05 (70 y.o. Male) Treating RN: Phillis Haggis Primary Care Physician: Oretha Milch Other Clinician: Referring Physician: Oretha Milch Treating Physician/Extender: Rudene Re in Treatment: 6 Verbal / Phone Orders: Yes Clinician: Ashok Cordia,  Debi Read Back and Verified: Yes Diagnosis Coding Wound Cleansing Wound #1 Left Lower Leg o May shower with protection. - please cover wraps and keep dry o No tub bath. Wound #2 Right Lower Leg o May shower with protection. - please cover wraps and keep dry o No tub bath. Wound #3 Right Calcaneus o Cleanse wound with mild soap and water o May Shower, gently pat wound dry prior to applying new dressing. o May shower with protection. - please cover wraps and keep dry o No tub bath. Wound #5 Right,Dorsal Toe Fifth o May shower with protection. - please cover wraps and keep dry o No  tub bath. Anesthetic Wound #1 Left Lower Leg o Topical Lidocaine 4% cream applied to wound bed prior to debridement Wound #2 Right Lower Leg o Topical Lidocaine 4% cream applied to wound bed prior to debridement Wound #3 Right Calcaneus o Topical Lidocaine 4% cream applied to wound bed prior to debridement Skin Barriers/Peri-Wound Care Wound #3 Right Calcaneus o Barrier cream Primary Wound Dressing Wound #1 Left Lower Leg o Aquacel Ag - ******IF THE PT MISSES HIS APPT FOR THE WEEK NURSING AT THE FACILITY NEEDS TO CHANGE WRAPS AND DRESSINGS ON LEGS AND FOOT***** Scholle, Jeramyah V. ([id number]) Wound #2 Right Lower Leg o Aquacel Ag - ******IF THE PT MISSES HIS APPT FOR THE WEEK NURSING AT THE FACILITY NEEDS TO CHANGE WRAPS AND DRESSINGS ON LEGS AND FOOT***** Wound #3 Right Calcaneus o Santyl Ointment Wound #5 Right,Dorsal Toe Fifth o Aquacel Ag Wound #6 Right,Lateral Foot o Foam - ******IF THE PT MISSES HIS APPT FOR THE WEEK NURSING AT THE FACILITY NEEDS TO CHANGE WRAPS AND DRESSINGS ON LEGS AND FOOT***** Secondary Dressing Wound #1 Left Lower Leg o ABD pad - ******IF THE PT MISSES HIS APPT FOR THE WEEK NURSING AT THE FACILITY NEEDS TO CHANGE WRAPS AND DRESSINGS ON LEGS AND FOOT***** Wound #2 Right Lower Leg o ABD pad - ******IF THE PT MISSES HIS APPT FOR  THE WEEK NURSING AT THE FACILITY NEEDS TO CHANGE WRAPS AND DRESSINGS ON LEGS AND FOOT***** Wound #3 Right Calcaneus o Gauze, ABD and Kerlix/Conform o Foam Wound #5 Right,Dorsal Toe Fifth o Dry Gauze - tape, or can use band-aid o Gauze and Kerlix/Conform Dressing Change Frequency Wound #1 Left Lower Leg o Change dressing every week - in clinic ******IF THE PT MISSES HIS APPT FOR THE WEEK NURSING AT THE FACILITY NEEDS TO CHANGE WRAPS AND DRESSINGS ON LEGS AND FOOT***** Wound #2 Right Lower Leg o Change dressing every week - in clinic ******IF THE PT MISSES HIS APPT FOR THE WEEK NURSING AT THE FACILITY NEEDS TO CHANGE WRAPS AND DRESSINGS ON LEGS AND FOOT***** Wound #3 Right Calcaneus o Change dressing every day. - daily with santyl Wound #5 Right,Dorsal Toe Fifth o Change dressing every other day. [name], [name] ([id number]) Wound #6 Right,Lateral Foot o Change dressing every week - in clinic ******IF THE PT MISSES HIS APPT FOR THE WEEK NURSING AT THE FACILITY NEEDS TO CHANGE WRAPS AND DRESSINGS ON LEGS AND FOOT***** Follow-up Appointments Wound #1 Left Lower Leg o Return Appointment in 1 week. - ******PLEASE MAKE SURE PATIENT COMES TO HIS WEEKLY APPTS, HE HAS TO HAVE HIS WRAPS CHANGED.****** ******IF THE PT MISSES HIS APPT FOR THE WEEK NURSING AT THE FACILITY NEEDS TO CHANGE WRAPS AND DRESSINGS ON LEGS AND FOOT***** Wound #2 Right Lower Leg o Return Appointment in 1 week. - ******PLEASE MAKE SURE PATIENT COMES TO HIS WEEKLY APPTS, HE HAS TO HAVE HIS WRAPS CHANGED.****** ******IF THE PT MISSES HIS APPT FOR THE WEEK NURSING AT THE FACILITY NEEDS TO CHANGE WRAPS AND DRESSINGS ON LEGS AND FOOT***** Wound #3 Right Calcaneus o Return Appointment in 1 week. - ******PLEASE MAKE SURE PATIENT COMES TO HIS WEEKLY APPTS, HE HAS TO HAVE HIS WRAPS CHANGED.****** ******IF THE PT MISSES HIS APPT FOR THE WEEK NURSING AT THE FACILITY NEEDS TO CHANGE WRAPS AND DRESSINGS  ON LEGS AND FOOT***** Wound #5 Right,Dorsal Toe Fifth o Return Appointment in 1 week. - ******PLEASE MAKE SURE PATIENT COMES TO HIS WEEKLY APPTS, HE HAS TO HAVE HIS WRAPS CHANGED.****** ******IF THE PT MISSES  HIS APPT FOR THE WEEK NURSING AT THE FACILITY NEEDS TO CHANGE WRAPS AND DRESSINGS ON LEGS AND FOOT***** Wound #6 Right,Lateral Foot o Return Appointment in 1 week. - ******PLEASE MAKE SURE PATIENT COMES TO HIS WEEKLY APPTS, HE HAS TO HAVE HIS WRAPS CHANGED.****** ******IF THE PT MISSES HIS APPT FOR THE WEEK NURSING AT THE FACILITY NEEDS TO CHANGE WRAPS AND DRESSINGS ON LEGS AND FOOT***** Edema Control Wound #1 Left Lower Leg o 3 Layer Compression System - Bilateral - unna to achor ******IF THE PT MISSES HIS APPT FOR THE WEEK NURSING AT THE FACILITY NEEDS TO CHANGE WRAPS AND DRESSINGS ON LEGS AND FOOT***** o Elevate legs to the level of the heart and pump ankles as often as possible - ******IF THE PT MISSES HIS APPT FOR THE WEEK NURSING AT THE FACILITY NEEDS TO CHANGE WRAPS AND DRESSINGS ON LEGS AND FOOT***** Wound #2 Right Lower Leg [name], [name] ([id number]) o 3 Layer Compression System - Bilateral - unna to anchor ******IF THE PT MISSES HIS APPT FOR THE WEEK NURSING AT THE FACILITY NEEDS TO CHANGE WRAPS AND DRESSINGS ON LEGS AND FOOT***** o Elevate legs to the level of the heart and pump ankles as often as possible - ******IF THE PT MISSES HIS APPT FOR THE WEEK NURSING AT THE FACILITY NEEDS TO CHANGE WRAPS AND DRESSINGS ON LEGS AND FOOT***** Wound #3 Right Calcaneus o Elevate legs to the level of the heart and pump ankles as often as possible Off-Loading Wound #1 Left Lower Leg o Turn and reposition every 2 hours o Other: - sage boots Wound #2 Right Lower Leg o Turn and reposition every 2 hours o Other: - sage boots Wound #3 Right Calcaneus o Turn and reposition every 2 hours o Other: - sage boots Consults o Vascular Electronic  Signature(s) Signed: 02/16/2016 4:30:51 PM By: Evlyn Kanner MD, FACS Signed: 02/20/2016 4:14:54 PM By: Alejandro Mulling Entered By: Alejandro Mulling on 02/16/2016 15:07:14 [name] ([id number]) -------------------------------------------------------------------------------- Problem List Details Patient Name: [name] Date of Service: 02/16/2016 1:30 PM Medical Record Number: [id number] Patient Account Number: [id number] Date of Birth/Sex: 1946/03/07 (70 y.o. Male) Treating RN: Phillis Haggis Primary Care Physician: Oretha Milch Other Clinician: Referring Physician: Oretha Milch Treating Physician/Extender: Rudene Re in Treatment: 6 Active Problems ICD-10 Encounter Code Description Active Date Diagnosis E11.621 Type 2 diabetes mellitus with foot ulcer 01/01/2016 Yes L89.613 Pressure ulcer of right heel, stage 3 01/01/2016 Yes E66.01 Morbid (severe) obesity due to excess calories 01/01/2016 Yes I89.0 Lymphedema, not elsewhere classified 01/01/2016 Yes M70.871 Other soft tissue disorders related to use, overuse and 01/19/2016 Yes pressure, right ankle and foot 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/16/2016 Yes ulceration of heel and midfoot Inactive Problems Resolved Problems ICD-10 Code Description Active Date Resolved Date L89.322 Pressure ulcer of left buttock, stage 2 01/01/2016 01/01/2016 Electronic Signature(s) Signed: 02/16/2016 2:26:26 PM By: Evlyn Kanner MD, FACS Previous Signature: 02/16/2016 2:21:33 PM Version By: Evlyn Kanner MD, FACS [name] Guy, Damire VMarland Kitchen ([id number]) Entered By: Evlyn Kanner on 02/16/2016 14:26:25 [name] ([id number]) -------------------------------------------------------------------------------- Progress Note Details Patient Name: [name] Date of Service: 02/16/2016 1:30 PM Medical Record Number: [id number] Patient Account Number: [id number] Date of Birth/Sex: 10/18/1946 (70 y.o. Male) Treating  RN: Phillis Haggis Primary Care Physician: Oretha Milch Other Clinician: Referring Physician: Oretha Milch Treating Physician/Extender: Rudene Re in Treatment: 6 Subjective Chief Complaint Information obtained from Patient Patient is at the clinic for treatment of an open pressure ulcer  the left upper thigh and gluteal region and the right heel with bilateral swelling of the legs all of it which is going on for over a year History of Present Illness (HPI) The following HPI elements were documented for the patient's wound: Location: ulcerated area on the right heel, left gluteal region and thigh and then bilateral lower extremities Quality: Patient reports experiencing a sharp pain to affected area(s). Severity: Patient states wound are getting worse. Duration: Patient has had the wound for > 12 months prior to seeking treatment at the wound center Timing: Pain in wound is constant (hurts all the time) Context: The wound appeared gradually over time Modifying Factors: Other treatment(s) tried include: he sees his heart doctor and his primary care doctor Associated Signs and Symptoms: patient has not been able to walk for over a year now 70 year old gentleman with a known history of hypertension, diabetes, obstructive sleep apnea, COPD, diastolic CHF, coronary artery disease was admitted to the hospital with sepsis from an ulcer of the right heel and was treated there in October 2016. he also has chronic bilateral lower extremity edema and lymphedema. He had received vancomycin, Zosyn and at that stage and x-rays showed hardware in the right ankle but no evidence of osteomyelitis. he was a former smoker. he was also treated with Augmentin orally for 10 days. He is either bedbound or wheelchair-bound and does not ambulate by himself. 01/19/2016 -- he has not been seen here for 3 weeks and this was because he was admitted to [facility] between 223 and  01/08/2016 for sepsis, UTI and pneumonia involving the left lung. he was treated with IV vancomycin and Zosyn and then meropenem. Was discharged home on oral Bactrim for 2 weeks none of his vascular test or x-rays were done and we will reorder these. 01/26/2016 -- he has not yet done the x-ray of his foot and is vascular tests are still pending. I have asked him to work on these with his nursing home staff. Addendum: he has got an x-ray of the right foot done which shows that his osteopenia but no specific ostial lysis or abnormal periosteal reaction. Final impression was degenerative changes with dorsal foot soft tissue swelling. 02/16/2016 -- lower extremity arterial duplex examination shows a 50-99% stenosis of the right tibioperoneal trunk. He had biphasic flow in the right SFA, popliteal and tibioperoneal trunk. Left-sided he had triphasic flow throughout [name], [name] ([id number]) Objective Constitutional Pulse regular. Respirations normal and unlabored. Afebrile. Vitals Time Taken: 1:21 PM, Height: 70 in, Weight: 235 lbs, BMI: 33.7, Pulse: 73 bpm, Respiratory Rate: 20 breaths/min, Blood Pressure: 155/82 mmHg. Eyes Nonicteric. Reactive to light. Ears, Nose, Mouth, and Throat Lips, teeth, and gums WNL.Marland Kitchen Moist mucosa without lesions. Neck supple and nontender. No palpable supraclavicular or cervical adenopathy. Normal sized without goiter. Respiratory WNL. No retractions.. Breath sounds WNL, No rubs, rales, rhonchi, or wheeze.. Cardiovascular Heart rhythm and rate regular, no murmur or gallop.. Pedal Pulses WNL. No clubbing, cyanosis or edema. Lymphatic No adneopathy. No adenopathy. No adenopathy. Musculoskeletal Adexa without tenderness or enlargement.. Digits and nails w/o clubbing, cyanosis, infection, petechiae, ischemia, or inflammatory conditions.Marland Kitchen Psychiatric Judgement and insight Intact.. No evidence of depression, anxiety, or agitation.. General Notes: the wounds on  both lower legs are looking fairly good. The right heel has a lot of subcutaneous eschar and debris and this was sharply dissected with a 15 number blade and a forcep. Bleeding was controlled with pressure. Integumentary (Hair, Skin) No suspicious  lesions. No crepitus or fluctuance. No peri-wound warmth or erythema. No masses.. Wound #1 status is Open. Original cause of wound was Gradually Appeared. The wound is located on the Left Lower Leg. The wound measures 10.5cm length x 12cm width x 0.1cm depth; 98.96cm^2 area and [name], [name] ([id number]) 9.896cm^3 volume. The wound is limited to skin breakdown. There is no tunneling or undermining noted. There is a large amount of serous drainage noted. The wound margin is flat and intact. There is large (67- 100%) pink granulation within the wound bed. There is a small (1-33%) amount of necrotic tissue within the wound bed including Adherent Slough. The periwound skin appearance exhibited: Localized Edema, Moist, Erythema. The surrounding wound skin color is noted with erythema which is circumferential. Periwound temperature was noted as No Abnormality. The periwound has tenderness on palpation. Wound #2 status is Open. Original cause of wound was Gradually Appeared. The wound is located on the Right Lower Leg. The wound measures 11cm length x 15cm width x 0.1cm depth; 129.591cm^2 area and 12.959cm^3 volume. The wound is limited to skin breakdown. There is no tunneling or undermining noted. There is a large amount of serous drainage noted. The wound margin is flat and intact. There is large (67- 100%) pink granulation within the wound bed. There is a small (1-33%) amount of necrotic tissue within the wound bed including Adherent Slough. The periwound skin appearance exhibited: Localized Edema, Moist, Erythema. The surrounding wound skin color is noted with erythema which is circumferential. Periwound temperature was noted as No Abnormality. The  periwound has tenderness on palpation. Wound #3 status is Open. Original cause of wound was Pressure Injury. The wound is located on the Right Calcaneus. The wound measures 2.4cm length x 3.7cm width x 0.3cm depth; 6.974cm^2 area and 2.092cm^3 volume. The wound is limited to skin breakdown. There is no tunneling or undermining noted. There is a large amount of serosanguineous drainage noted. The wound margin is flat and intact. There is small (1-33%) red, pink granulation within the wound bed. There is a large (67-100%) amount of necrotic tissue within the wound bed including Eschar and Adherent Slough. The periwound skin appearance exhibited: Localized Edema, Maceration, Moist, Erythema. The surrounding wound skin color is noted with erythema which is circumferential. Periwound temperature was noted as No Abnormality. The periwound has tenderness on palpation. Wound #5 status is Open. Original cause of wound was Trauma. The wound is located on the Right,Dorsal Toe Fifth. The wound measures 0.3cm length x 0.2cm width x 0.1cm depth; 0.047cm^2 area and 0.005cm^3 volume. The wound is limited to skin breakdown. There is no tunneling or undermining noted. There is a medium amount of serosanguineous drainage noted. The wound margin is flat and intact. There is large (67-100%) red, pink granulation within the wound bed. There is no necrotic tissue within the wound bed. The periwound skin appearance exhibited: Moist. Wound #6 status is Open. Original cause of wound was Pressure Injury. The wound is located on the Right,Lateral Foot. The wound measures 0.5cm length x 0.4cm width x 0.1cm depth; 0.157cm^2 area and 0.016cm^3 volume. The wound is limited to skin breakdown. There is no tunneling or undermining noted. There is a large amount of serosanguineous drainage noted. The wound margin is flat and intact. There is large (67-100%) red granulation within the wound bed. There is a small (1-33%) amount of  necrotic tissue within the wound bed including Eschar and Adherent Slough. The periwound skin appearance exhibited: Moist. Periwound temperature was noted  as No Abnormality. The periwound has tenderness on palpation. Assessment Active Problems ICD-10 E11.621 - Type 2 diabetes mellitus with foot ulcer [name], [name] 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 Santyl ointment locally to his right heel. The areas was debrided today and he was able to tolerate it. 2. Constant off loading and also using Sage boot. 3. x-ray of the right heel -- report reviewed and discussed with the patient 4. Bilateral arterial duplex studies of both lower extremities -- report reviewed with the patient and I have recommended a vascular consult 5. bilateral lower extremity Profore lites which he has been using before 6. High-protein diet and multivitamins including vitamin C and zinc. Procedures Wound #3 Wound #3 is a Pressure Ulcer located on the Right Calcaneus . There was a Skin/Subcutaneous Tissue Debridement ([id number]) debridement with total area of 8.88 sq cm performed by Evlyn Kanner, MD. with the following instrument(s): Blade and Forceps to remove Viable and Non-Viable tissue/material including Exudate, Fibrin/Slough, and Subcutaneous after achieving pain control using Other (lidocaine 4% cream). A time out was conducted prior to the start of the procedure. A Minimum amount of bleeding was controlled with Pressure. The procedure was tolerated well with a pain level of 0 throughout and a pain level  of 0 following the procedure. Post Debridement Measurements: 2.4cm length x 3.4cm width x 0.4cm depth; 2.564cm^3 volume. Post debridement Stage noted as Category/Stage III. Post procedure Diagnosis Wound #3: Same as Pre-Procedure Plan Wound Cleansing: Wound #1 Left Lower Leg: May shower with protection. - please cover wraps and keep dry No tub bath. Wound #2 Right Lower Leg: May shower with protection. - please cover wraps and keep dry No tub bath. Wound #3 Right Calcaneus: Cleanse wound with mild soap and water [name], [name] ([id number]) May Shower, gently pat wound dry prior to applying new dressing. May shower with protection. - please cover wraps and keep dry No tub bath. Wound #5 Right,Dorsal Toe Fifth: May shower with protection. - please cover wraps and keep dry No tub bath. Anesthetic: Wound #1 Left Lower Leg: Topical Lidocaine 4% cream applied to wound bed prior to debridement Wound #2 Right Lower Leg: Topical Lidocaine 4% cream applied to wound bed prior to debridement Wound #3 Right Calcaneus: Topical Lidocaine 4% cream applied to wound bed prior to debridement Skin Barriers/Peri-Wound Care: Wound #3 Right Calcaneus: Barrier cream Primary Wound Dressing: Wound #1 Left Lower Leg: Aquacel Ag - ******IF THE PT MISSES HIS APPT FOR THE WEEK NURSING AT THE FACILITY NEEDS TO CHANGE WRAPS AND DRESSINGS ON LEGS AND FOOT***** Wound #2 Right Lower Leg: Aquacel Ag - ******IF THE PT MISSES HIS APPT FOR THE WEEK NURSING AT THE FACILITY NEEDS TO CHANGE WRAPS AND DRESSINGS ON LEGS AND FOOT***** Wound #3 Right Calcaneus: Santyl Ointment Wound #5 Right,Dorsal Toe Fifth: Aquacel Ag Wound #6 Right,Lateral Foot: Foam - ******IF THE PT MISSES HIS APPT FOR THE WEEK NURSING AT THE FACILITY NEEDS TO CHANGE WRAPS AND DRESSINGS ON LEGS AND FOOT***** Secondary Dressing: Wound #1 Left Lower Leg: ABD pad - ******IF THE PT MISSES HIS APPT FOR THE WEEK NURSING AT  THE FACILITY NEEDS TO  CHANGE WRAPS AND DRESSINGS ON LEGS AND FOOT***** Wound #2 Right Lower Leg: ABD pad - ******IF THE PT MISSES HIS APPT FOR THE WEEK NURSING AT THE FACILITY NEEDS TO CHANGE WRAPS AND DRESSINGS ON LEGS AND FOOT***** Wound #3 Right Calcaneus: Gauze, ABD and Kerlix/Conform Foam Wound #5 Right,Dorsal Toe Fifth: Dry Gauze - tape, or can use band-aid Gauze and Kerlix/Conform Dressing Change Frequency: Wound #1 Left Lower Leg: Change dressing every week - in clinic ******IF THE PT MISSES HIS APPT FOR THE WEEK NURSING AT THE FACILITY NEEDS TO CHANGE WRAPS AND DRESSINGS ON LEGS AND FOOT***** Wound #2 Right Lower Leg: Change dressing every week - in clinic ******IF THE PT MISSES HIS APPT FOR THE WEEK NURSING AT THE FACILITY NEEDS TO CHANGE WRAPS AND DRESSINGS ON LEGS AND FOOT***** Wound #3 Right Calcaneus: [name], [name] ([id number]) Change dressing every day. - daily with santyl Wound #5 Right,Dorsal Toe Fifth: Change dressing every other day. Wound #6 Right,Lateral Foot: Change dressing every week - in clinic ******IF THE PT MISSES HIS APPT FOR THE WEEK NURSING AT THE FACILITY NEEDS TO CHANGE WRAPS AND DRESSINGS ON LEGS AND FOOT***** Follow-up Appointments: Wound #1 Left Lower Leg: Return Appointment in 1 week. - ******PLEASE MAKE SURE PATIENT COMES TO HIS WEEKLY APPTS, HE HAS TO HAVE HIS WRAPS CHANGED.****** ******IF THE PT MISSES HIS APPT FOR THE WEEK NURSING AT THE FACILITY NEEDS TO CHANGE WRAPS AND DRESSINGS ON LEGS AND FOOT***** Wound #2 Right Lower Leg: Return Appointment in 1 week. - ******PLEASE MAKE SURE PATIENT COMES TO HIS WEEKLY APPTS, HE HAS TO HAVE HIS WRAPS CHANGED.****** ******IF THE PT MISSES HIS APPT FOR THE WEEK NURSING AT THE FACILITY NEEDS TO CHANGE WRAPS AND DRESSINGS ON LEGS AND FOOT***** Wound #3 Right Calcaneus: Return Appointment in 1 week. - ******PLEASE MAKE SURE PATIENT COMES TO HIS WEEKLY APPTS, HE HAS TO HAVE HIS WRAPS CHANGED.****** ******IF THE PT MISSES  HIS APPT FOR THE WEEK NURSING AT THE FACILITY NEEDS TO CHANGE WRAPS AND DRESSINGS ON LEGS AND FOOT***** Wound #5 Right,Dorsal Toe Fifth: Return Appointment in 1 week. - ******PLEASE MAKE SURE PATIENT COMES TO HIS WEEKLY APPTS, HE HAS TO HAVE HIS WRAPS CHANGED.****** ******IF THE PT MISSES HIS APPT FOR THE WEEK NURSING AT THE FACILITY NEEDS TO CHANGE WRAPS AND DRESSINGS ON LEGS AND FOOT***** Wound #6 Right,Lateral Foot: Return Appointment in 1 week. - ******PLEASE MAKE SURE PATIENT COMES TO HIS WEEKLY APPTS, HE HAS TO HAVE HIS WRAPS CHANGED.****** ******IF THE PT MISSES HIS APPT FOR THE WEEK NURSING AT THE FACILITY NEEDS TO CHANGE WRAPS AND DRESSINGS ON LEGS AND FOOT***** Edema Control: Wound #1 Left Lower Leg: 3 Layer Compression System - Bilateral - unna to achor ******IF THE PT MISSES HIS APPT FOR THE WEEK NURSING AT THE FACILITY NEEDS TO CHANGE WRAPS AND DRESSINGS ON LEGS AND FOOT***** Elevate legs to the level of the heart and pump ankles as often as possible - ******IF THE PT MISSES HIS APPT FOR THE WEEK NURSING AT THE FACILITY NEEDS TO CHANGE WRAPS AND DRESSINGS ON LEGS AND FOOT***** Wound #2 Right Lower Leg: 3 Layer Compression System - Bilateral - unna to anchor ******IF THE PT MISSES HIS APPT FOR THE WEEK NURSING AT THE FACILITY NEEDS TO CHANGE WRAPS AND DRESSINGS ON LEGS AND FOOT***** Elevate legs to the level of the heart and pump ankles as often as possible - ******IF THE PT MISSES HIS APPT FOR THE WEEK NURSING AT THE FACILITY NEEDS  TO CHANGE WRAPS AND DRESSINGS ON LEGS AND FOOT***** Wound #3 Right Calcaneus: Elevate legs to the level of the heart and pump ankles as often as possible Off-Loading: Wound #1 Left Lower Leg: [name], [name] ([id number]) Turn and reposition every 2 hours Other: - sage boots Wound #2 Right Lower Leg: Turn and reposition every 2 hours Other: - sage boots Wound #3 Right Calcaneus: Turn and reposition every 2 hours Other: - sage  boots Consults ordered were: Vascular After reviewing his vascular reports and also debriding his heel sharply, I have recommended: 1. Santyl ointment locally to his right heel. The areas was debrided today and he was able to tolerate it. 2. Constant off loading and also using Sage boot. 3. x-ray of the right heel -- report reviewed and discussed with the patient 4. Bilateral arterial duplex studies of both lower extremities -- report reviewed with the patient and I have recommended a vascular consult 5. bilateral lower extremity Profore lites which he has been using before 6. High-protein diet and multivitamins including vitamin C and zinc. Electronic Signature(s) Signed: 02/19/2016 3:30:13 PM By: Evlyn Kanner MD, FACS Previous Signature: 02/16/2016 4:32:55 PM Version By: Evlyn Kanner MD, FACS Previous Signature: 02/16/2016 2:26:43 PM Version By: Evlyn Kanner MD, FACS Previous Signature: 02/16/2016 2:25:08 PM Version By: Evlyn Kanner MD, FACS Entered By: Evlyn Kanner on 02/19/2016 15:30:13 [name] ([id number]) -------------------------------------------------------------------------------- SuperBill Details Patient Name: [name] Date of Service: 02/16/2016 Medical Record Number: [id number] Patient Account Number: [id number] Date of Birth/Sex: 07/21/1946 (70 y.o. 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 Facility Procedures CPT4: Description Modifier Quantity Code 62952841 11042 - DEB SUBQ TISSUE 20 SQ CM/< 1 ICD-10 Description Diagnosis E11.621 Type 2 diabetes mellitus with foot ulcer L89.613 Pressure ulcer of right heel, stage 3 I70.234 Atherosclerosis of native arteries  of right leg with ulceration of heel and midfoot I89.0 Lymphedema, not elsewhere classified Physician Procedures CPT4: Description Modifier Quantity Code [id number] 99213 - WC PHYS LEVEL 3 - EST PT 25 1 ICD-10 Description Diagnosis E11.621 Type 2 diabetes mellitus with foot ulcer L89.613 Pressure ulcer of right heel, stage 3 E66.01 Morbid (severe) obesity due to  excess calories 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 ([id number]) Electronic Signature(s) Signed: 02/16/2016 2:26:07 PM By: Evlyn Kanner MD, FACS Entered By: Evlyn Kanner on 02/16/2016 14:26:07

## 2016-02-18 IMAGING — CR DG CHEST 2V
2 series · 3 of 3 positions shown · non-contrast
Comparison: November 26, 2014

CLINICAL DATA: Weakness and shaking

EXAM:
CHEST  2 VIEW

[Series 2: chest lat · 0.14mm/px · 2 of 2 slices shown]
[im 1/2]
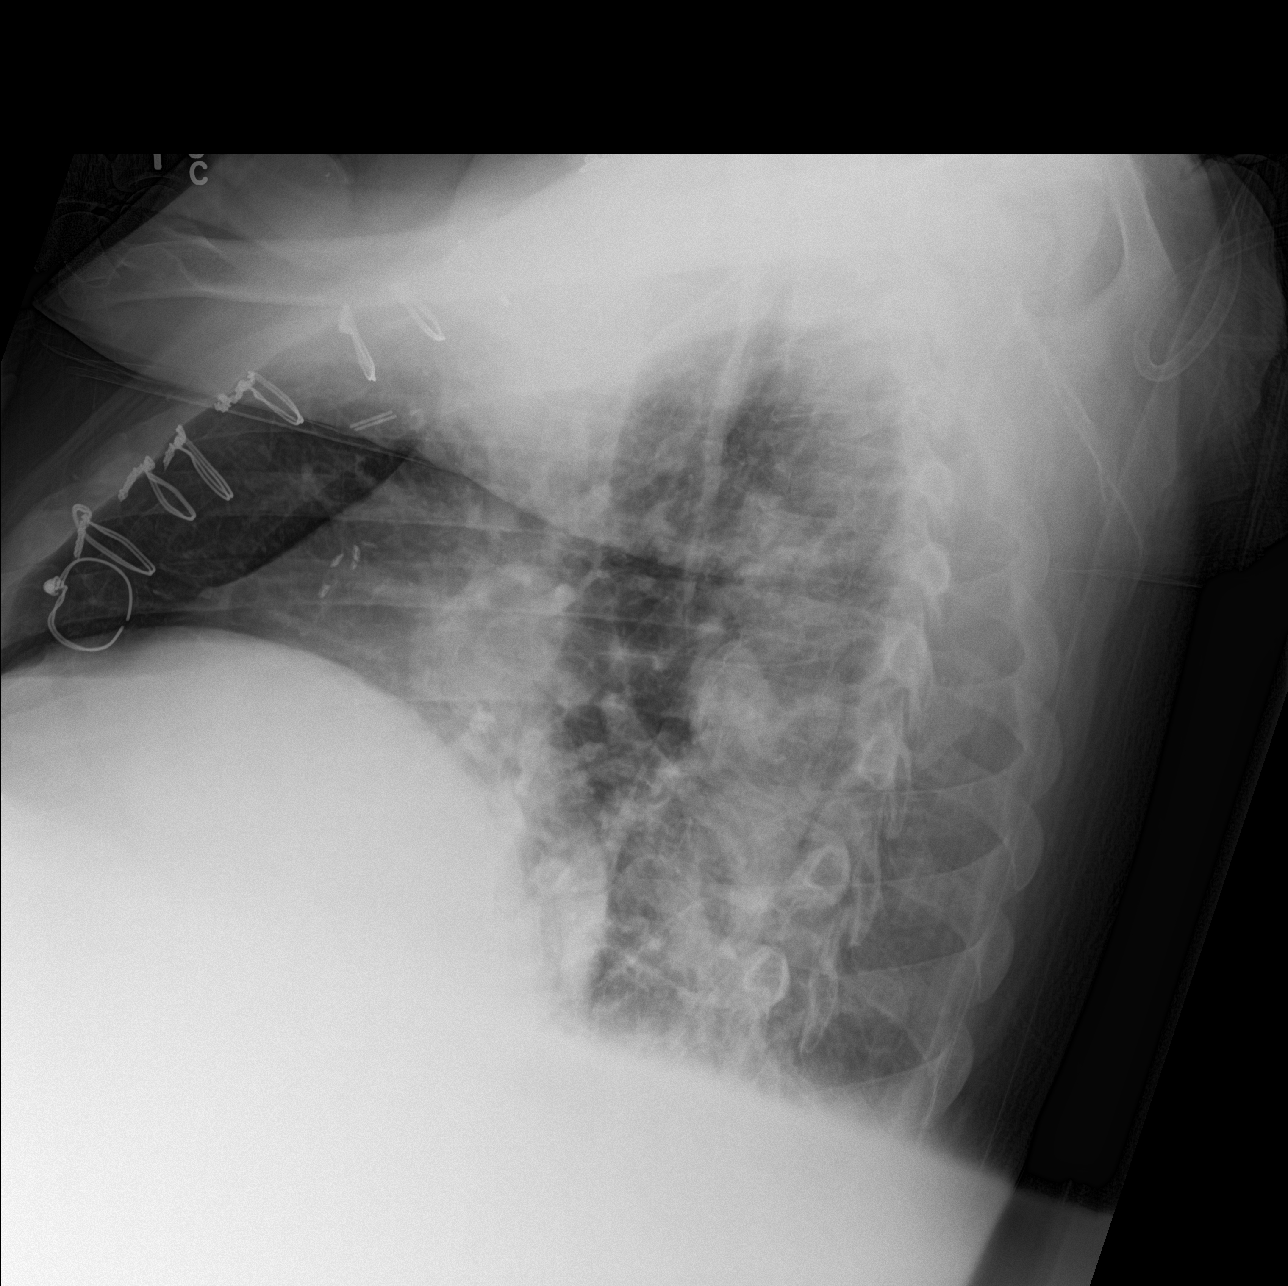
[im 2/2]
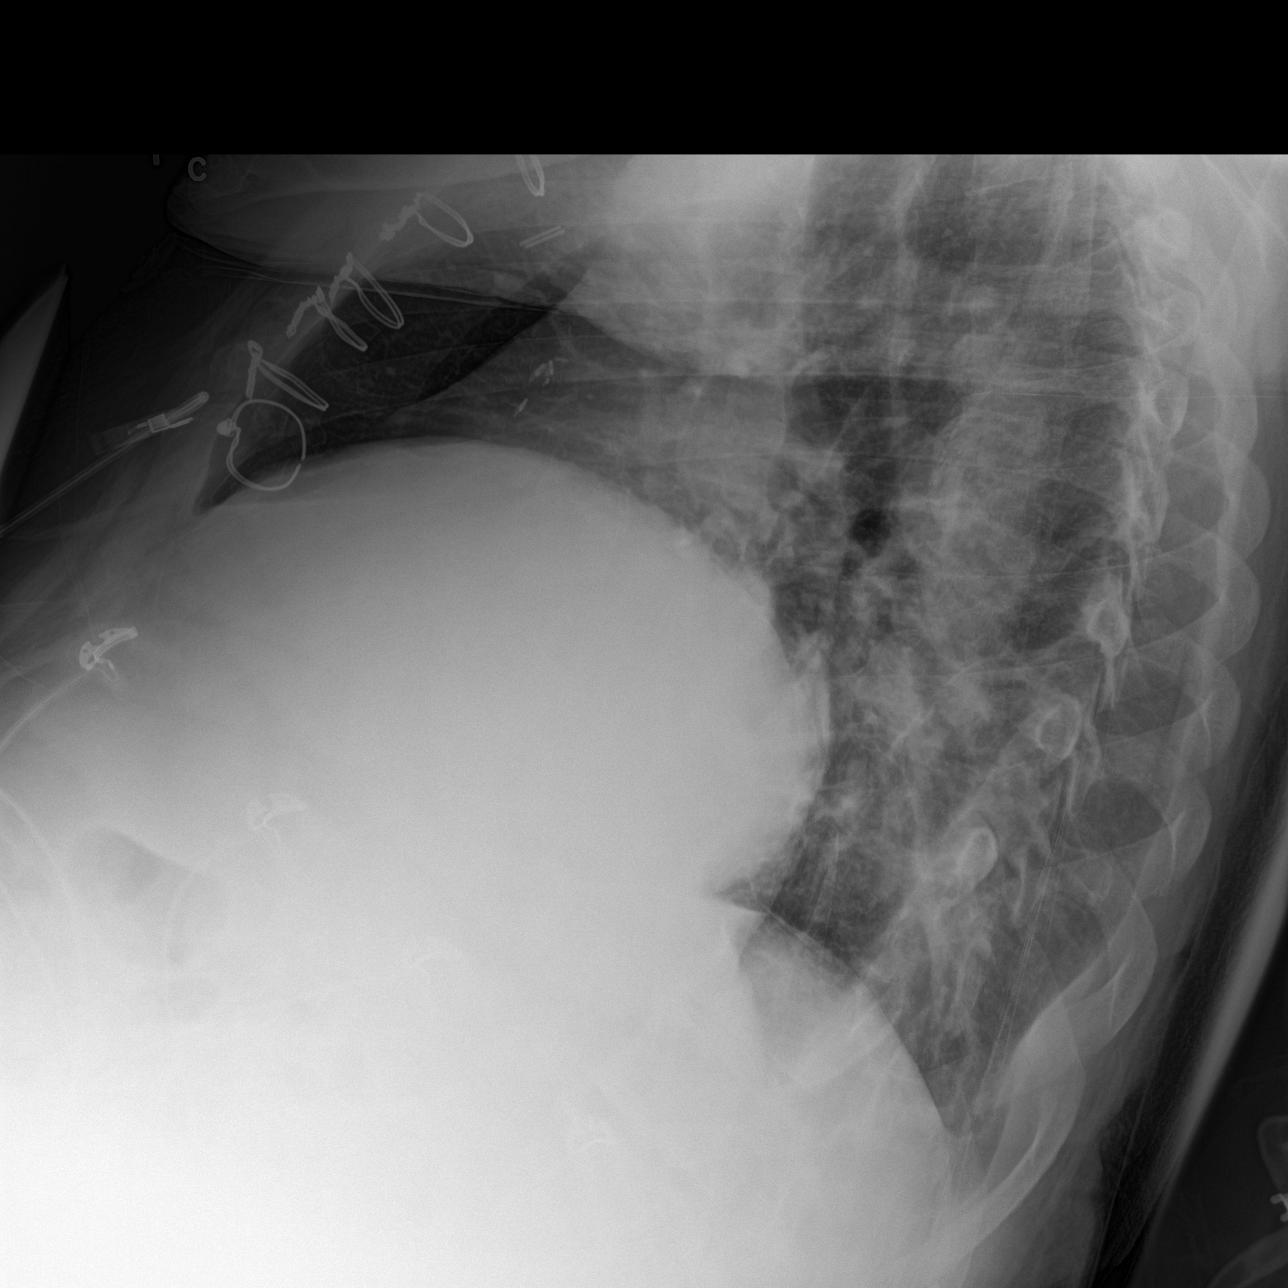

[chest ap]
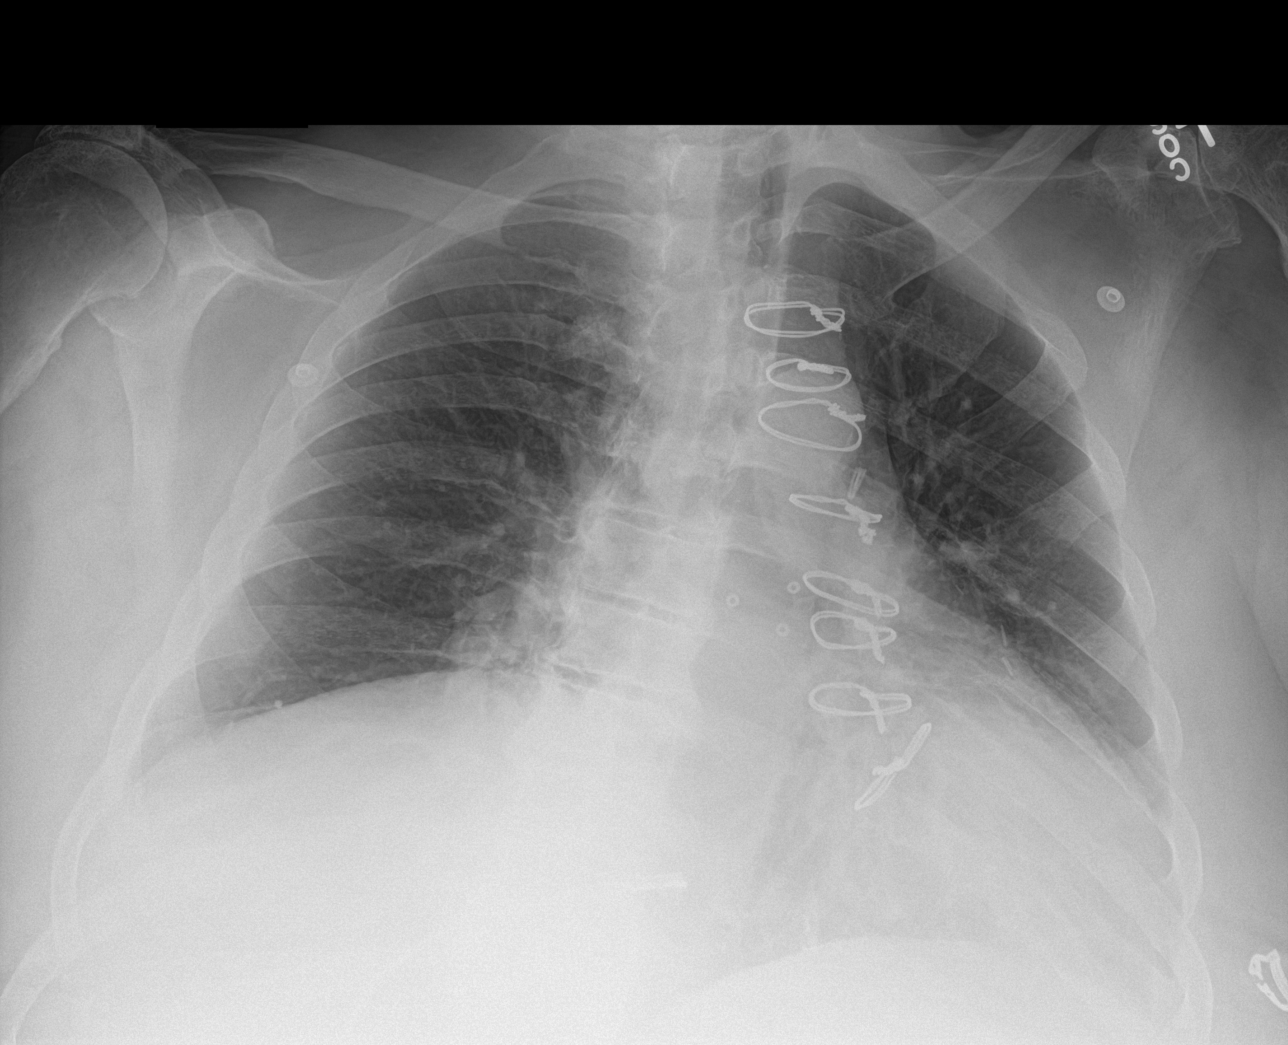

[3 of 3 positions shown; findings below may reference images not displayed]

FINDINGS: There is stable elevation of the right hemidiaphragm. There is no
edema or consolidation. Heart is mildly enlarged with pulmonary
vascularity within normal limits. Patient is status post coronary
artery bypass grafting. No adenopathy. No bone lesions.
IMPRESSION: No edema or consolidation. Heart prominent but stable. Stable
elevation of the right hemidiaphragm.

## 2016-02-18 IMAGING — CR DG FOOT COMPLETE 3+V*R*
1 series · 3 of 3 positions shown · non-contrast
Comparison: None.

CLINICAL DATA: Pain. Chronic right heel ulcer, bilateral
lymphedema, recent fever. Renal failure.

EXAM:
RIGHT FOOT COMPLETE - 3+ VIEW

[Series 1: ap · 0.17mm/px · 3 of 3 slices shown]
[im 1/3]
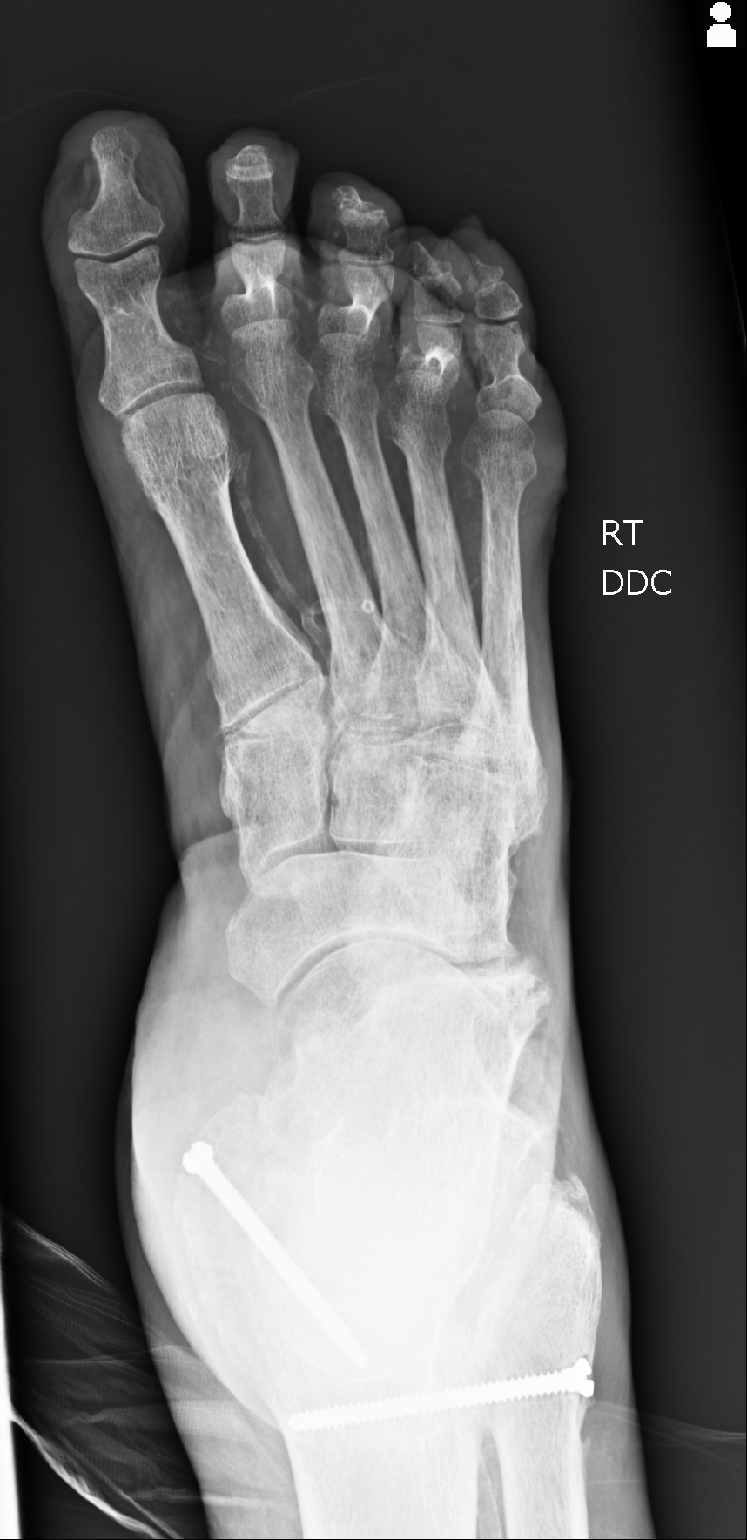
[im 2/3]
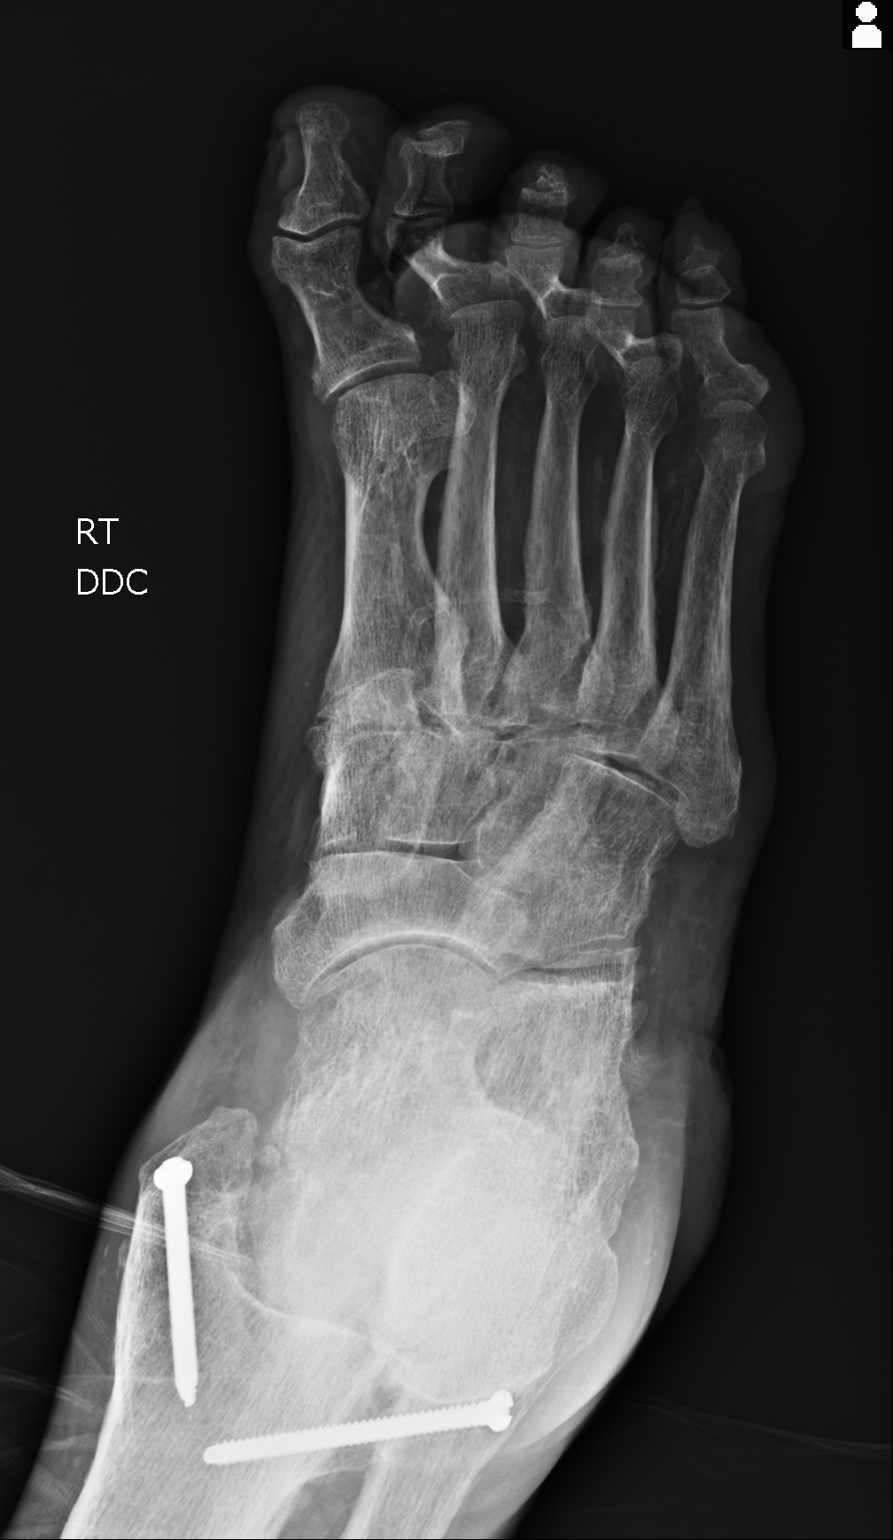
[im 3/3]
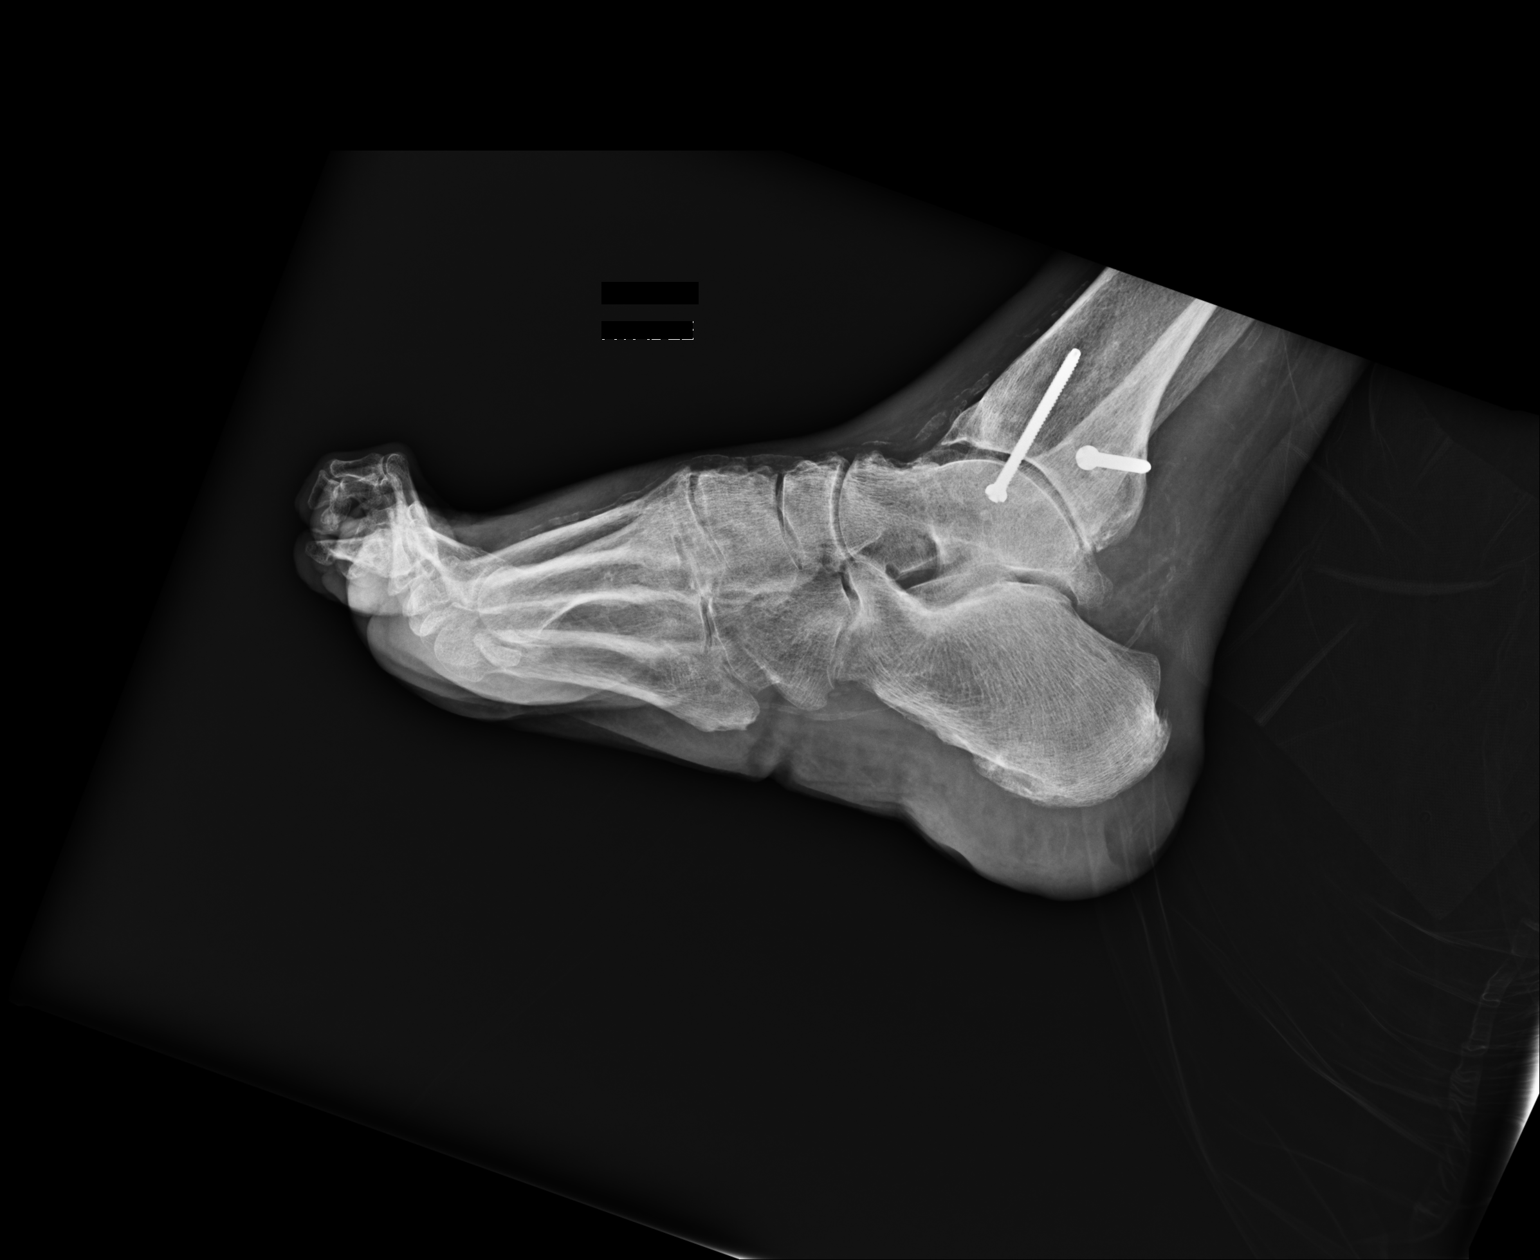

[3 of 3 positions shown; findings below may reference images not displayed]

FINDINGS: Orthopedic hardware across the distal tibiofibular syndesmosis and
medial malleolus. Calcaneal spurs. Extensive arterial
calcifications. Second through fifth digits are not well profiled.
No definite fracture. No dislocation. No subcutaneous gas or
radiodense foreign body identified.
IMPRESSION: 1. Chronic and postoperative changes as above, without fracture or
other acute bone abnormality.

## 2016-02-20 NOTE — Progress Notes (Signed)
LETRELL, ATTWOOD ([id number]) Visit Report for 02/16/2016 Arrival Information Details Patient Name: [name], [name] Date of Service: 02/16/2016 1:30 PM Medical Record Number: [id number] Patient Account Number: [id number] Date of Birth/Sex: 09/26/1946 (70 y.o. Male) Treating RN: Phillis Haggis Primary Care Physician: Oretha Milch Other Clinician: Referring Physician: Oretha Milch Treating Physician/Extender: Rudene Re in Treatment: 6 Visit Information History Since Last Visit All ordered tests and consults were completed: No Patient Arrived: Wheel Chair Added or deleted any medications: No Arrival Time: 13:15 Any new allergies or adverse reactions: No Accompanied By: self Had a fall or experienced change in No Transfer Assistance: [employer] activities of daily living that may affect Patient Lift risk of falls: Patient Identification Verified: Yes Signs or symptoms of abuse/neglect since last No Secondary Verification Process Yes visito Completed: Hospitalized since last visit: No Patient Requires Transmission- No Pain Present Now: No Based Precautions: Patient Has Alerts: Yes Patient Alerts: DM II Electronic Signature(s) Signed: 02/20/2016 4:14:54 PM By: Alejandro Mulling Entered By: Alejandro Mulling on 02/16/2016 13:21:09 [name] ([id number]) -------------------------------------------------------------------------------- Encounter Discharge Information Details Patient Name: [name] Date of Service: 02/16/2016 1:30 PM Medical Record Number: [id number] Patient Account Number: [id number] Date of Birth/Sex: 05/03/1946 (70 y.o. Male) Treating RN: Phillis Haggis Primary Care Physician: Oretha Milch Other Clinician: Referring Physician: Oretha Milch Treating Physician/Extender: Rudene Re in Treatment: 6 Encounter Discharge Information Items Discharge Pain Level: 0 Discharge Condition: Stable Ambulatory Status: Wheelchair Discharge  Destination: Nursing Home Transportation: Other Accompanied By: self Schedule Follow-up Appointment: Yes Medication Reconciliation completed Yes and provided to Patient/Care Kellina Dreese: Provided on Clinical Summary of Care: 02/16/2016 Form Type Recipient Paper Patient EL Electronic Signature(s) Signed: 02/20/2016 4:14:54 PM By: Alejandro Mulling Previous Signature: 02/16/2016 2:38:50 PM Version By: Gwenlyn Perking Entered By: Alejandro Mulling on 02/16/2016 15:05:44 [name], [name] ([id number]) -------------------------------------------------------------------------------- Lower Extremity Assessment Details Patient Name: [name] Date of Service: 02/16/2016 1:30 PM Medical Record Number: [id number] Patient Account Number: [id number] Date of Birth/Sex: 07-22-1946 (70 y.o. Male) Treating RN: Phillis Haggis Primary Care Physician: Oretha Milch Other Clinician: Referring Physician: Oretha Milch Treating Physician/Extender: Rudene Re in Treatment: 6 Edema Assessment Assessed: [Left: No] [Right: No] Edema: [Left: Yes] [Right: Yes] Calf Left: Right: Point of Measurement: cm From Medial Instep 34.2 cm 33.2 cm Ankle Left: Right: Point of Measurement: cm From Medial Instep 25.5 cm 24.5 cm Vascular Assessment Pulses: Posterior Tibial Dorsalis Pedis Palpable: [Left:Yes] [Right:Yes] Extremity colors, hair growth, and conditions: Extremity Color: [Left:Hyperpigmented] [Right:Hyperpigmented] Temperature of Extremity: [Left:Warm] [Right:Warm] Capillary Refill: [Left:< 3 seconds] [Right:< 3 seconds] Toe Nail Assessment Left: Right: Thick: Yes Yes Discolored: Yes Yes Deformed: Yes Yes Improper Length and Hygiene: Yes Yes Electronic Signature(s) Signed: 02/20/2016 4:14:54 PM By: Alejandro Mulling Entered By: Alejandro Mulling on 02/16/2016 13:37:40 [name], [name] ([id number]) -------------------------------------------------------------------------------- Multi Wound Chart  Details Patient Name: [name] Date of Service: 02/16/2016 1:30 PM Medical Record Number: [id number] Patient Account Number: [id number] Date of Birth/Sex: 23-Apr-1946 (70 y.o. Male) Treating RN: Phillis Haggis Primary Care Physician: Oretha Milch Other Clinician: Referring Physician: Oretha Milch Treating Physician/Extender: Rudene Re in Treatment: 6 Vital Signs Height(in): 70 Pulse(bpm): 73 Weight(lbs): 235 Blood Pressure 155/82 (mmHg): Body Mass Index(BMI): 34 Temperature(F): Respiratory Rate 20 (breaths/min): Photos: [1:No Photos] [2:No Photos] [3:No Photos] Wound Location: [1:Left Lower Leg] [2:Right Lower Leg] [3:Right Calcaneus] Wounding Event: [1:Gradually Appeared] [2:Gradually Appeared] [3:Pressure Injury] Primary Etiology: [1:Venous Leg Ulcer] [2:Venous Leg Ulcer] [3:Pressure Ulcer] Comorbid History: [1:Cataracts, Chronic Obstructive Pulmonary Disease (COPD),  Sleep Disease (COPD), Sleep Disease (COPD), Sleep Apnea, Angina, Congestive Heart Failure, Congestive Heart Failure, Congestive Heart Failure, Hypertension, Type II Diabetes,  Gout, Osteoarthritis, Neuropathy Osteoarthritis, Neuropathy Osteoarthritis, Neuropathy] [2:Cataracts, Chronic Obstructive Pulmonary Apnea, Angina, Hypertension, Type II Diabetes, Gout,] [3:Cataracts, Chronic Obstructive Pulmonary Apnea, Angina,  Hypertension, Type II Diabetes, Gout,] Date Acquired: [1:12/31/2014] [2:12/31/2014] [3:10/31/2015] Weeks of Treatment: [1:6] [2:6] [3:6] Wound Status: [1:Open] [2:Open] [3:Open] Measurements L x W x D 10.5x12x0.1 [2:11x15x0.1] [3:2.4x3.7x0.3] (cm) Area (cm) : [1:98.96] [2:129.591] [3:6.974] Volume (cm) : [1:9.896] [2:12.959] [3:2.092] % Reduction in Area: [1:-303.90%] [2:-54811.40%] [3:1.30%] % Reduction in Volume: -303.90% [2:-53895.80%] [3:-47.90%] Classification: [1:Partial Thickness] [2:Partial Thickness] [3:Category/Stage III] HBO Classification: [1:Grade 1] [2:Grade 1]  [3:Grade 1] Exudate Amount: [1:Large] [2:Large] [3:Large] Exudate Type: [1:Serous] [2:Serous] [3:Serosanguineous] Exudate Color: [1:amber] [2:amber] [3:red, brown] Wound Margin: [1:Flat and Intact] [2:Flat and Intact] [3:Flat and Intact] Granulation Amount: [1:Large (67-100%)] [2:Large (67-100%)] [3:Small (1-33%)] Granulation Quality: [1:Pink] [2:Pink] [3:Red, Pink] Necrotic Amount: [1:Small (1-33%)] [2:Small (1-33%)] [3:Large (67-100%)] Necrotic Tissue: Adherent Slough Adherent [employer], Adherent Slough Exposed Structures: Fascia: No Fascia: No Fascia: No Fat: No Fat: No Fat: No Tendon: No Tendon: No Tendon: No Muscle: No Muscle: No Muscle: No Joint: No Joint: No Joint: No Bone: No Bone: No Bone: No Limited to Skin Limited to Skin Limited to Skin Breakdown Breakdown Breakdown Epithelialization: None None None Periwound Skin Texture: Edema: Yes Edema: Yes Edema: Yes Periwound Skin Moist: Yes Moist: Yes Maceration: Yes Moisture: Moist: Yes Periwound Skin Color: Erythema: Yes Erythema: Yes Erythema: Yes Erythema Location: Circumferential Circumferential Circumferential Temperature: No Abnormality No Abnormality No Abnormality Tenderness on Yes Yes Yes Palpation: Wound Preparation: Ulcer Cleansing: Other: Ulcer Cleansing: Other: Ulcer Cleansing: soap and water soap and water Rinsed/Irrigated with Saline Topical Anesthetic Topical Anesthetic Applied: Other: lidocaine Applied: Other: lidocaine Topical Anesthetic 4% 4% Applied: Other: lidocaine 4% Wound Number: 5 6 N/A Photos: No Photos No Photos N/A Wound Location: Right Toe Fifth - Dorsal Right Foot - Lateral N/A Wounding Event: Trauma Pressure Injury N/A Primary Etiology: Trauma, Other Pressure Ulcer N/A Comorbid History: Cataracts, Chronic Cataracts, Chronic N/A Obstructive Pulmonary Obstructive Pulmonary Disease (COPD), Sleep Disease (COPD), Sleep Apnea, Angina, Apnea, Angina, Congestive Heart Failure,  Congestive Heart Failure, Hypertension, Type II Hypertension, Type II Diabetes, Gout, Diabetes, Gout, Osteoarthritis, Neuropathy Osteoarthritis, Neuropathy Date Acquired: 01/26/2016 02/16/2016 N/A Weeks of Treatment: 3 0 N/A Wound Status: Open Open N/A Measurements L x W x D 0.3x0.2x0.1 0.5x0.4x0.1 N/A (cm) Area (cm) : 0.047 0.157 N/A Volume (cm) : 0.005 0.016 N/A % Reduction in Area: 78.60% N/A N/A % Reduction in Volume: 77.30% N/A N/A Classification: Partial Thickness Category/Stage II N/A HBO Classification: Grade 1 Grade 1 N/A [name], [name] ([id number]) Exudate Amount: Medium Large N/A Exudate Type: Serosanguineous Serosanguineous N/A Exudate Color: red, brown red, brown N/A Wound Margin: Flat and Intact Flat and Intact N/A Granulation Amount: Large (67-100%) Large (67-100%) N/A Granulation Quality: Red, Pink Red N/A Necrotic Amount: None Present (0%) Small (1-33%) N/A Necrotic Tissue: N/A Eschar, Adherent Slough N/A Exposed Structures: Fascia: No Fascia: No N/A Fat: No Fat: No Tendon: No Tendon: No Muscle: No Muscle: No Joint: No Joint: No Bone: No Bone: No Limited to Skin Limited to Skin Breakdown Breakdown Epithelialization: None None N/A Periwound Skin Texture: No Abnormalities Noted No Abnormalities Noted N/A Periwound Skin Moist: Yes Moist: Yes N/A Moisture: Periwound Skin Color: No Abnormalities Noted No Abnormalities Noted N/A Erythema Location: N/A N/A N/A Temperature: N/A No Abnormality N/A Tenderness on No Yes N/A  Palpation: Wound Preparation: Ulcer Cleansing: Ulcer Cleansing: N/A Rinsed/Irrigated with Rinsed/Irrigated with Saline Saline Topical Anesthetic Topical Anesthetic Applied: Other: lidocaine Applied: Other: lidocaine 4% 4% Treatment Notes Electronic Signature(s) Signed: 02/20/2016 4:14:54 PM By: Alejandro Mulling Entered By: Alejandro Mulling on 02/16/2016 13:48:50 [name]  ([id number]) -------------------------------------------------------------------------------- Multi-Disciplinary Care Plan Details Patient Name: [name] Date of Service: 02/16/2016 1:30 PM Medical Record Number: [id number] Patient Account Number: [id number] Date of Birth/Sex: 16-Jun-1946 (70 y.o. Male) Treating RN: Phillis Haggis Primary Care Physician: Oretha Milch Other Clinician: Referring Physician: Oretha Milch Treating Physician/Extender: Rudene Re in Treatment: 6 Active Inactive Electronic Signature(s) Signed: 02/20/2016 4:14:54 PM By: Alejandro Mulling Entered By: Alejandro Mulling on 02/16/2016 13:47:47 [name], [name] ([id number]) -------------------------------------------------------------------------------- Pain Assessment Details Patient Name: [name] Date of Service: 02/16/2016 1:30 PM Medical Record Number: [id number] Patient Account Number: [id number] Date of Birth/Sex: 01-17-1946 (70 y.o. Male) Treating RN: Phillis Haggis Primary Care Physician: Oretha Milch Other Clinician: Referring Physician: Oretha Milch Treating Physician/Extender: Rudene Re in Treatment: 6 Active Problems Location of Pain Severity and Description of Pain Patient Has Paino No Site Locations Pain Management and Medication Current Pain Management: Electronic Signature(s) Signed: 02/20/2016 4:14:54 PM By: Alejandro Mulling Entered By: Alejandro Mulling on 02/16/2016 13:21:14 [name] ([id number]) -------------------------------------------------------------------------------- Patient/Caregiver Education Details Patient Name: [name] Date of Service: 02/16/2016 1:30 PM Medical Record Number: [id number] Patient Account Number: [id number] Date of Birth/Gender: Apr 22, 1946 (70 y.o. Male) Treating RN: Phillis Haggis Primary Care Physician: Oretha Milch Other Clinician: Referring Physician: Oretha Milch Treating Physician/Extender: Rudene Re  in Treatment: 6 Education Assessment Education Provided To: Patient Education Topics Provided Wound/Skin Impairment: Handouts: Other: change dressings as order and do not get wraps wet Methods: Demonstration, Explain/Verbal Responses: State content correctly Electronic Signature(s) Signed: 02/20/2016 4:14:54 PM By: Alejandro Mulling Entered By: Alejandro Mulling on 02/16/2016 15:06:14 [name], [name] ([id number]) -------------------------------------------------------------------------------- Wound Assessment Details Patient Name: [name] Date of Service: 02/16/2016 1:30 PM Medical Record Number: [id number] Patient Account Number: [id number] Date of Birth/Sex: 26-Jul-1946 (70 y.o. Male) Treating RN: Phillis Haggis Primary Care Physician: Oretha Milch Other Clinician: Referring Physician: Oretha Milch Treating Physician/Extender: Rudene Re in Treatment: 6 Wound Status Wound Number: 1 Primary Venous Leg Ulcer Etiology: Wound Location: Left Lower Leg Wound Open Wounding Event: Gradually Appeared Status: Date Acquired: 12/31/2014 Comorbid Cataracts, Chronic Obstructive Weeks Of Treatment: 6 History: Pulmonary Disease (COPD), Sleep Clustered Wound: No Apnea, Angina, Congestive Heart Failure, Hypertension, Type II Diabetes, Gout, Osteoarthritis, Neuropathy Photos Wound Measurements Length: (cm) 10.5 Width: (cm) 12 Depth: (cm) 0.1 Area: (cm) 98.96 Volume: (cm) 9.896 % Reduction in Area: -303.9% % Reduction in Volume: -303.9% Epithelialization: None Tunneling: No Undermining: No Wound Description Classification: Partial Thickness Foul Odor Af Diabetic Severity (Wagner): Grade 1 Wound Margin: Flat and Intact Exudate Amount: Large Exudate Type: Serous Exudate Color: amber ter Cleansing: No Wound Bed Granulation Amount: Large (67-100%) Exposed Structure Granulation Quality: Pink Fascia Exposed: No [name], [name] ([id number]) Necrotic Amount:  Small (1-33%) Fat Layer Exposed: No Necrotic Quality: Adherent Slough Tendon Exposed: No Muscle Exposed: No Joint Exposed: No Bone Exposed: No Limited to Skin Breakdown Periwound Skin Texture Texture Color No Abnormalities Noted: No No Abnormalities Noted: No Localized Edema: Yes Erythema: Yes Erythema Location: Circumferential Moisture No Abnormalities Noted: No Temperature / Pain Moist: Yes Temperature: No Abnormality Tenderness on Palpation: Yes Wound Preparation Ulcer Cleansing: Other: soap and water, Topical Anesthetic Applied: Other: lidocaine 4%, Treatment Notes Wound #1 (Left Lower  Leg) 1. Cleansed with: Cleanse wound with antibacterial soap and water 2. Anesthetic Topical Lidocaine 4% cream to wound bed prior to debridement 4. Dressing Applied: Aquacel Ag 5. Secondary Dressing Applied ABD Pad 7. Secured with Tape 3 Layer Compression System - Bilateral Electronic Signature(s) Signed: 02/20/2016 4:14:54 PM By: Alejandro Mulling Entered By: Alejandro Mulling on 02/16/2016 16:43:17 [name], [name] ([id number]) -------------------------------------------------------------------------------- Wound Assessment Details Patient Name: [name] Date of Service: 02/16/2016 1:30 PM Medical Record Number: [id number] Patient Account Number: [id number] Date of Birth/Sex: [birth year] (70 y.o. Male) Treating RN: Phillis Haggis Primary Care Physician: Oretha Milch Other Clinician: Referring Physician: Oretha Milch Treating Physician/Extender: Rudene Re in Treatment: 6 Wound Status Wound Number: 2 Primary Venous Leg Ulcer Etiology: Wound Location: Right Lower Leg Wound Open Wounding Event: Gradually Appeared Status: Date Acquired: 12/31/2014 Comorbid Cataracts, Chronic Obstructive Weeks Of Treatment: 6 History: Pulmonary Disease (COPD), Sleep Clustered Wound: No Apnea, Angina, Congestive Heart Failure, Hypertension, Type II Diabetes, Gout,  Osteoarthritis, Neuropathy Photos Wound Measurements Length: (cm) 11 Width: (cm) 15 Depth: (cm) 0.1 Area: (cm) 129.591 Volume: (cm) 12.959 % Reduction in Area: -54811.4% % Reduction in Volume: -53895.8% Epithelialization: None Tunneling: No Undermining: No Wound Description Classification: Partial Thickness Foul Odor Af Diabetic Severity (Wagner): Grade 1 Wound Margin: Flat and Intact Exudate Amount: Large Exudate Type: Serous Exudate Color: amber ter Cleansing: No Wound Bed Granulation Amount: Large (67-100%) Exposed Structure Granulation Quality: Pink Fascia Exposed: No Bowsher, Medard V. ([id number]) Necrotic Amount: Small (1-33%) Fat Layer Exposed: No Necrotic Quality: Adherent Slough Tendon Exposed: No Muscle Exposed: No Joint Exposed: No Bone Exposed: No Limited to Skin Breakdown Periwound Skin Texture Texture Color No Abnormalities Noted: No No Abnormalities Noted: No Localized Edema: Yes Erythema: Yes Erythema Location: Circumferential Moisture No Abnormalities Noted: No Temperature / Pain Moist: Yes Temperature: No Abnormality Tenderness on Palpation: Yes Wound Preparation Ulcer Cleansing: Other: soap and water, Topical Anesthetic Applied: Other: lidocaine 4%, Treatment Notes Wound #2 (Right Lower Leg) 1. Cleansed with: Cleanse wound with antibacterial soap and water 2. Anesthetic Topical Lidocaine 4% cream to wound bed prior to debridement 4. Dressing Applied: Aquacel Ag 5. Secondary Dressing Applied ABD Pad 7. Secured with Tape 3 Layer Compression System - Bilateral Electronic Signature(s) Signed: 02/20/2016 4:14:54 PM By: Alejandro Mulling Entered By: Alejandro Mulling on 02/16/2016 16:43:38 [name], [name] ([id number]) -------------------------------------------------------------------------------- Wound Assessment Details Patient Name: [name] Date of Service: 02/16/2016 1:30 PM Medical Record Number: [id number] Patient  Account Number: [id number] Date of Birth/Sex: 1946/05/25 (70 y.o. Male) Treating RN: Phillis Haggis Primary Care Physician: Oretha Milch Other Clinician: Referring Physician: Oretha Milch Treating Physician/Extender: Rudene Re in Treatment: 6 Wound Status Wound Number: 3 Primary Pressure Ulcer Etiology: Wound Location: Right Calcaneus Wound Open Wounding Event: Pressure Injury Status: Date Acquired: 10/31/2015 Comorbid Cataracts, Chronic Obstructive Weeks Of Treatment: 6 History: Pulmonary Disease (COPD), Sleep Clustered Wound: No Apnea, Angina, Congestive Heart Failure, Hypertension, Type II Diabetes, Gout, Osteoarthritis, Neuropathy Photos Photo Uploaded By: Alejandro Mulling on 02/16/2016 16:39:30 Wound Measurements Length: (cm) 2.4 Width: (cm) 3.7 Depth: (cm) 0.3 Area: (cm) 6.974 Volume: (cm) 2.092 % Reduction in Area: 1.3% % Reduction in Volume: -47.9% Epithelialization: None Tunneling: No Undermining: No Wound Description Classification: Category/Stage III Diabetic Severity [address]): Grade 1 Wound Margin: Flat and Intact Exudate Amount: Large Exudate Type: Serosanguineous Exudate Color: red, brown Foul Odor After Cleansing: No Wound Bed Granulation Amount: Small (1-33%) Exposed Structure [name], [name] ([id number]) Granulation Quality: Red, Pink Fascia Exposed: No Necrotic  Amount: Large (67-100%) Fat Layer Exposed: No Necrotic Quality: Eschar, Adherent Slough Tendon Exposed: No Muscle Exposed: No Joint Exposed: No Bone Exposed: No Limited to Skin Breakdown Periwound Skin Texture Texture Color No Abnormalities Noted: No No Abnormalities Noted: No Localized Edema: Yes Erythema: Yes Erythema Location: Circumferential Moisture No Abnormalities Noted: No Temperature / Pain Maceration: Yes Temperature: No Abnormality Moist: Yes Tenderness on Palpation: Yes Wound Preparation Ulcer Cleansing: Rinsed/Irrigated with Saline Topical  Anesthetic Applied: Other: lidocaine 4%, Treatment Notes Wound #3 (Right Calcaneus) 1. Cleansed with: Cleanse wound with antibacterial soap and water 2. Anesthetic Topical Lidocaine 4% cream to wound bed prior to debridement 3. Peri-wound Care: Barrier cream 4. Dressing Applied: Santyl Ointment 5. Secondary Dressing Applied Dry Gauze Foam Kerlix/Conform 7. Secured with Secretary/administrator) Signed: 02/20/2016 4:14:54 PM By: Alejandro Mulling Entered By: Alejandro Mulling on 02/16/2016 13:40:04 [name] ([id number]) -------------------------------------------------------------------------------- Wound Assessment Details Patient Name: [name] Date of Service: 02/16/2016 1:30 PM Medical Record Number: [id number] Patient Account Number: [id number] Date of Birth/Sex: 05-Feb-1946 (70 y.o. Male) Treating RN: Phillis Haggis Primary Care Physician: Oretha Milch Other Clinician: Referring Physician: Oretha Milch Treating Physician/Extender: Rudene Re in Treatment: 6 Wound Status Wound Number: 5 Primary Trauma, Other Etiology: Wound Location: Right Toe Fifth - Dorsal Wound Open Wounding Event: Trauma Status: Date Acquired: 01/26/2016 Comorbid Cataracts, Chronic Obstructive Weeks Of Treatment: 3 History: Pulmonary Disease (COPD), Sleep Clustered Wound: No Apnea, Angina, Congestive Heart Failure, Hypertension, Type II Diabetes, Gout, Osteoarthritis, Neuropathy Photos Photo Uploaded By: Alejandro Mulling on 02/16/2016 16:39:30 Wound Measurements Length: (cm) 0.3 Width: (cm) 0.2 Depth: (cm) 0.1 Area: (cm) 0.047 Volume: (cm) 0.005 % Reduction in Area: 78.6% % Reduction in Volume: 77.3% Epithelialization: None Tunneling: No Undermining: No Wound Description Classification: Partial Thickness Foul Odor A Diabetic Severity (Wagner): Grade 1 Wound Margin: Flat and Intact Exudate Amount: Medium Exudate Type: Serosanguineous Exudate Color: red,  brown fter Cleansing: No Wound Bed Granulation Amount: Large (67-100%) Exposed Structure [name], [name] ([id number]) Granulation Quality: Red, Pink Fascia Exposed: No Necrotic Amount: None Present (0%) Fat Layer Exposed: No Tendon Exposed: No Muscle Exposed: No Joint Exposed: No Bone Exposed: No Limited to Skin Breakdown Periwound Skin Texture Texture Color No Abnormalities Noted: No No Abnormalities Noted: No Moisture No Abnormalities Noted: No Moist: Yes Wound Preparation Ulcer Cleansing: Rinsed/Irrigated with Saline Topical Anesthetic Applied: Other: lidocaine 4%, Treatment Notes Wound #5 (Right, Dorsal Toe Fifth) 1. Cleansed with: Cleanse wound with antibacterial soap and water 2. Anesthetic Topical Lidocaine 4% cream to wound bed prior to debridement 4. Dressing Applied: Aquacel Ag Notes band-aid Electronic Signature(s) Signed: 02/20/2016 4:14:54 PM By: Alejandro Mulling Entered By: Alejandro Mulling on 02/16/2016 13:44:13 [name], Kayhan V. ([id number]) -------------------------------------------------------------------------------- Wound Assessment Details Patient Name: [name] Date of Service: 02/16/2016 1:30 PM Medical Record Number: [id number] Patient Account Number: [id number] Date of Birth/Sex: [birth year] (70 y.o. Male) Treating RN: Phillis Haggis Primary Care Physician: Oretha Milch Other Clinician: Referring Physician: Oretha Milch Treating Physician/Extender: Rudene Re in Treatment: 6 Wound Status Wound Number: 6 Primary Pressure Ulcer Etiology: Wound Location: Right Foot - Lateral Wound Open Wounding Event: Pressure Injury Status: Date Acquired: 02/16/2016 Comorbid Cataracts, Chronic Obstructive Weeks Of Treatment: 0 History: Pulmonary Disease (COPD), Sleep Clustered Wound: No Apnea, Angina, Congestive Heart Failure, Hypertension, Type II Diabetes, Gout, Osteoarthritis, Neuropathy Photos Photo Uploaded By: Alejandro Mulling  on 02/16/2016 16:39:42 Wound Measurements Length: (cm) 0.5 Width: (cm) 0.4 Depth: (cm) 0.1 Area: (cm) 0.157 Volume: (cm) 0.016 %  Reduction in Area: % Reduction in Volume: Epithelialization: None Tunneling: No Undermining: No Wound Description Classification: Category/Stage II Foul Odor Aft Diabetic Severity (Wagner): Grade 1 Wound Margin: Flat and Intact Exudate Amount: Large Exudate Type: Serosanguineous Exudate Color: red, brown er Cleansing: No Wound Bed Granulation Amount: Large (67-100%) Exposed Structure Single, Majid V. ([id number]) Granulation Quality: Red Fascia Exposed: No Necrotic Amount: Small (1-33%) Fat Layer Exposed: No Necrotic Quality: Eschar, Adherent Slough Tendon Exposed: No Muscle Exposed: No Joint Exposed: No Bone Exposed: No Limited to Skin Breakdown Periwound Skin Texture Texture Color No Abnormalities Noted: No No Abnormalities Noted: No Moisture Temperature / Pain No Abnormalities Noted: No Temperature: No Abnormality Moist: Yes Tenderness on Palpation: Yes Wound Preparation Ulcer Cleansing: Rinsed/Irrigated with Saline Topical Anesthetic Applied: Other: lidocaine 4%, Treatment Notes Wound #6 (Right, Lateral Foot) 1. Cleansed with: Cleanse wound with antibacterial soap and water 2. Anesthetic Topical Lidocaine 4% cream to wound bed prior to debridement 4. Dressing Applied: Foam Electronic Signature(s) Signed: 02/20/2016 4:14:54 PM By: Alejandro MullingPinkerton, Debra Entered By: Alejandro MullingPinkerton, Debra on 02/16/2016 13:47:10 [name] OttoLINDLEY, Daiveon V. ([id number]) -------------------------------------------------------------------------------- Vitals Details Patient Name: [name] OttoLINDLEY, [name] Date of Service: 02/16/2016 1:30 PM Medical Record Number: [id number] Patient Account Number: [id number] Date of Birth/Sex: 1946/07/24 [age] y.o. Male) Treating RN: Phillis HaggisPinkerton, Debi Primary Care Physician: Oretha MilchSMITH, [name] Other Clinician: Referring Physician: Oretha MilchSMITH,  [name] Treating Physician/Extender: Rudene ReBritto, Errol Weeks in Treatment: 6 Vital Signs Time Taken: 13:21 Pulse (bpm): 73 Height (in): 70 Respiratory Rate (breaths/min): 20 Weight (lbs): 235 Blood Pressure (mmHg): 155/82 Body Mass Index (BMI): 33.7 Reference Range: 80 - 120 mg / dl Electronic Signature(s) Signed: 02/20/2016 4:14:54 PM By: Alejandro MullingPinkerton, Debra Entered By: Alejandro MullingPinkerton, Debra on 02/16/2016 13:21:29

## 2016-02-23 ENCOUNTER — Encounter (HOSPITAL_BASED_OUTPATIENT_CLINIC_OR_DEPARTMENT_OTHER): Payer: Medicare Other | Admitting: General Surgery

## 2016-02-23 DIAGNOSIS — L89151 Pressure ulcer of sacral region, stage 1: Secondary | ICD-10-CM

## 2016-02-23 DIAGNOSIS — L89511 Pressure ulcer of right ankle, stage 1: Secondary | ICD-10-CM | POA: Insufficient documentation

## 2016-02-23 DIAGNOSIS — E11621 Type 2 diabetes mellitus with foot ulcer: Secondary | ICD-10-CM | POA: Diagnosis not present

## 2016-02-23 DIAGNOSIS — L89614 Pressure ulcer of right heel, stage 4: Secondary | ICD-10-CM | POA: Diagnosis not present

## 2016-02-23 NOTE — Progress Notes (Signed)
[name], [name] ([id number]) Visit Report for 02/23/2016 Arrival Information Details Patient Name: [name], [name] Date of Service: 02/23/2016 8:45 AM Medical Record Number: [id number] Patient Account Number: [id number] Date of Birth/Sex: 1946/01/23 (69 y.o. Male) Treating RN: Phillis Haggis Primary Care Physician: Oretha Milch Other Clinician: Referring Physician: Oretha Milch Treating Physician/Extender: Elayne Snare in Treatment: 7 Visit Information History Since Last Visit All ordered tests and consults were completed: No Patient Arrived: Wheel Chair Added or deleted any medications: No Arrival Time: 08:41 Any new allergies or adverse reactions: No Accompanied By: self Had a fall or experienced change in No Transfer Assistance: [employer] activities of daily living that may affect Patient Lift risk of falls: Patient Identification Verified: Yes Signs or symptoms of abuse/neglect since last No Secondary Verification Process Yes visito Completed: Hospitalized since last visit: No Patient Requires Transmission- No Pain Present Now: No Based Precautions: Patient Has Alerts: Yes Patient Alerts: DM II Electronic Signature(s) Signed: 02/23/2016 1:16:43 PM By: Alejandro Mulling Entered By: Alejandro Mulling on 02/23/2016 08:42:06 [name] ([id number]) -------------------------------------------------------------------------------- Encounter Discharge Information Details Patient Name: [name] Date of Service: 02/23/2016 8:45 AM Medical Record Number: [id number] Patient Account Number: [id number] Date of Birth/Sex: [birth year] (70 y.o. Male) Treating RN: Phillis Haggis Primary Care Physician: Oretha Milch Other Clinician: Referring Physician: Oretha Milch Treating Physician/Extender: Elayne Snare in Treatment: 7 Encounter Discharge Information Items Discharge Pain Level: 0 Discharge Condition: Stable Ambulatory Status: Wheelchair Discharge  Destination: Nursing Home Transportation: Other Accompanied By: self Schedule Follow-up Appointment: Yes Medication Reconciliation completed and provided to Patient/Care Yes Halleigh Comes: Provided on Clinical Summary of Care: 02/23/2016 Form Type Recipient Paper Patient EL Electronic Signature(s) Signed: 02/23/2016 1:16:43 PM By: Alejandro Mulling Previous Signature: 02/23/2016 10:41:59 AM Version By: Ardath Sax MD Previous Signature: 02/23/2016 9:49:14 AM Version By: Gwenlyn Perking Entered By: Alejandro Mulling on 02/23/2016 10:48:22 [name] ([id number]) -------------------------------------------------------------------------------- Lower Extremity Assessment Details Patient Name: [name] Date of Service: 02/23/2016 8:45 AM Medical Record Number: [id number] Patient Account Number: [id number] Date of Birth/Sex: [birth year] (70 y.o. Male) Treating RN: Phillis Haggis Primary Care Physician: Oretha Milch Other Clinician: Referring Physician: Oretha Milch Treating Physician/Extender: Ardath Sax Weeks in Treatment: 7 Edema Assessment Assessed: [Left: No] [Right: No] E[Left: dema] [Right: :] Calf Left: Right: Point of Measurement: cm From Medial Instep 34.3 cm 33 cm Ankle Left: Right: Point of Measurement: cm From Medial Instep 25.4 cm 24.5 cm Vascular Assessment Pulses: Posterior Tibial Dorsalis Pedis Palpable: [Left:Yes] [Right:Yes] Extremity colors, hair growth, and conditions: Extremity Color: [Left:Hyperpigmented] [Right:Hyperpigmented] Temperature of Extremity: [Left:Warm] [Right:Warm] Capillary Refill: [Left:> 3 seconds] [Right:> 3 seconds] Toe Nail Assessment Left: Right: Thick: Yes Yes Discolored: Yes Yes Deformed: Yes Yes Improper Length and Hygiene: No Electronic Signature(s) Signed: 02/23/2016 1:16:43 PM By: Alejandro Mulling Entered By: Alejandro Mulling on 02/23/2016 08:50:18 [name] Guy, Desean Seth Bake  ([id number]) -------------------------------------------------------------------------------- Multi Wound Chart Details Patient Name: [name] Date of Service: 02/23/2016 8:45 AM Medical Record Number: [id number] Patient Account Number: [id number] Date of Birth/Sex: [birth year] (70 y.o. Male) Treating RN: Phillis Haggis Primary Care Physician: Oretha Milch Other Clinician: Referring Physician: Oretha Milch Treating Physician/Extender: Ardath Sax Weeks in Treatment: 7 Vital Signs Height(in): 70 Pulse(bpm): 68 Weight(lbs): 235 Blood Pressure 127/68 (mmHg): Body Mass Index(BMI): 34 Temperature(F): 97.5 Respiratory Rate 20 (breaths/min): Photos: [1:No Photos] [2:No Photos] [3:No Photos] Wound Location: [1:Left Lower Leg] [2:Right Lower Leg] [3:Right Calcaneus] Wounding Event: [1:Gradually Appeared] [2:Gradually Appeared] [3:Pressure Injury] Primary Etiology: [1:Venous Leg Ulcer] [2:Venous Leg Ulcer] [3:Pressure  Ulcer] Comorbid History: [1:Cataracts, Chronic Obstructive Pulmonary Disease (COPD), Sleep Disease (COPD), Sleep Disease (COPD), Sleep Apnea, Angina, Congestive Heart Failure, Congestive Heart Failure, Congestive Heart Failure, Hypertension, Type II Diabetes,  Gout, Osteoarthritis, Neuropathy Osteoarthritis, Neuropathy Osteoarthritis, Neuropathy] [2:Cataracts, Chronic Obstructive Pulmonary Apnea, Angina, Hypertension, Type II Diabetes, Gout,] [3:Cataracts, Chronic Obstructive Pulmonary Apnea, Angina,  Hypertension, Type II Diabetes, Gout,] Date Acquired: [1:12/31/2014] [2:12/31/2014] [3:10/31/2015] Weeks of Treatment: [1:7] [2:7] [3:7] Wound Status: [1:Open] [2:Open] [3:Open] Measurements L x W x D 6x5.5x0.1 [2:5.7x2x0.1] [3:2.5x4.3x0.2] (cm) Area (cm) : [1:25.918] [2:8.954] [3:8.443] Volume (cm) : [1:2.592] [2:0.895] [3:1.689] % Reduction in Area: [1:-5.80%] [2:-3694.10%] [3:-19.40%] % Reduction in Volume: -5.80% [2:-3629.20%] [3:-19.40%] Classification:  [1:Partial Thickness] [2:Partial Thickness] [3:Category/Stage III] HBO Classification: [1:Grade 1] [2:Grade 1] [3:Grade 1] Exudate Amount: [1:Large] [2:None Present] [3:Large] Exudate Type: [1:Serosanguineous] [2:N/A] [3:Serous] Exudate Color: [1:red, brown] [2:N/A] [3:amber] Wound Margin: [1:Flat and Intact] [2:Flat and Intact] [3:Flat and Intact] Granulation Amount: [1:Large (67-100%)] [2:None Present (0%)] [3:Small (1-33%)] Granulation Quality: [1:Pink] [2:N/A] [3:Red, Pink] Necrotic Amount: [1:Small (1-33%)] [2:None Present (0%)] [3:Large (67-100%)] Necrotic Tissue: Adherent Slough N/A Eschar, Adherent Slough Exposed Structures: Fascia: No Fascia: No Fascia: No Fat: No Fat: No Fat: No Tendon: No Tendon: No Tendon: No Muscle: No Muscle: No Muscle: No Joint: No Joint: No Joint: No Bone: No Bone: No Bone: No Limited to Skin Limited to Skin Limited to Skin Breakdown Breakdown Breakdown Epithelialization: None Large (67-100%) None Periwound Skin Texture: Edema: Yes Edema: No Edema: Yes Periwound Skin Moist: Yes Moist: No Maceration: Yes Moisture: Moist: Yes Periwound Skin Color: Erythema: Yes Erythema: Yes Erythema: Yes Erythema Location: Circumferential Circumferential Circumferential Temperature: No Abnormality No Abnormality No Abnormality Tenderness on Yes Yes Yes Palpation: Wound Preparation: Ulcer Cleansing: Other: Ulcer Cleansing: Other: Ulcer Cleansing: soap and water soap and water Rinsed/Irrigated with Saline Topical Anesthetic Applied: Other: lidocaine Topical Anesthetic 4% Applied: Other: lidocaine 4% Wound Number: [id number]  Photos: No Photos No Photos No Photos Wound Location: Right, Dorsal Toe Fifth Right Foot - Lateral Coccyx - Midline Wounding Event: Trauma Pressure Injury Pressure Injury Primary Etiology: Trauma, Other Pressure Ulcer Pressure Ulcer Comorbid History: N/A Cataracts, Chronic Cataracts, Chronic Obstructive Pulmonary Obstructive  Pulmonary Disease (COPD), Sleep Disease (COPD), Sleep Apnea, Angina, Apnea, Angina, Congestive Heart Failure, Congestive Heart Failure, Hypertension, Type II Hypertension, Type II Diabetes, Gout, Diabetes, Gout, Osteoarthritis, Neuropathy Osteoarthritis, Neuropathy Date Acquired: 01/26/2016 02/16/2016 02/23/2016 Weeks of Treatment: 4 1 0 Wound Status: Healed - Epithelialized Open Open Measurements L x W x D 0x0x0 0.5x0.5x0.1 5.5x5.5x0.1 (cm) Area (cm) : 0 0.196 23.758 Volume (cm) : 0 0.02 2.376 % Reduction in Area: 100.00% -24.80% N/A % Reduction in Volume: 100.00% -25.00% N/A Classification: Partial Thickness Category/Stage II Category/Stage II HBO Classification: N/A Grade 1 N/A Muenchow, Chukwuem[place]a V. ([id number]) Exudate Amount: N/A Large Small Exudate Type: N/A Serosanguineous Serosanguineous Exudate Color: N/A red, brown red, brown Wound Margin: N/A Flat and Intact Flat and Intact Granulation Amount: N/A Large (67-100%) Large (67-100%) Granulation Quality: N/A Red Red Necrotic Amount: N/A Small (1-33%) Small (1-33%) Necrotic Tissue: N/A Eschar, Adherent Slough Eschar, Adherent Slough Exposed Structures: N/A Fascia: No Fascia: No Fat: No Fat: No Tendon: No Tendon: No Muscle: No Muscle: No Joint: No Joint: No Bone: No Bone: No Limited to Skin Limited to Skin Breakdown Breakdown Epithelialization: N/A None None Periwound Skin Texture: No Abnormalities Noted No Abnormalities Noted No Abnormalities Noted Periwound Skin No Abnormalities Noted Moist: Yes Moist: Yes Moisture: Periwound Skin Color: No Abnormalities Noted No Abnormalities Noted  Erythema: Yes Erythema Location: N/A N/A Circumferential Temperature: N/A No Abnormality No Abnormality Tenderness on No Yes Yes Palpation: Wound Preparation: N/A Ulcer Cleansing: Ulcer Cleansing: Rinsed/Irrigated with Rinsed/Irrigated with Saline Saline Topical Anesthetic Topical Anesthetic Applied: Other: lidocaine Applied:  Other: lidocaine 4% 4% Treatment Notes Electronic Signature(s) Signed: 02/23/2016 1:16:43 PM By: Alejandro Mulling Entered By: Alejandro Mulling on 02/23/2016 09:08:32 [name] ([id number]) -------------------------------------------------------------------------------- Multi-Disciplinary Care Plan Details Patient Name: [name] Date of Service: 02/23/2016 8:45 AM Medical Record Number: [id number] Patient Account Number: [id number] Date of Birth/Sex: Jan 14, 1946 (70 y.o. Male) Treating RN: Phillis Haggis Primary Care Physician: Oretha Milch Other Clinician: Referring Physician: Oretha Milch Treating Physician/Extender: Ardath Sax Weeks in Treatment: 7 Active Inactive Electronic Signature(s) Signed: 02/23/2016 1:16:43 PM By: Alejandro Mulling Entered By: Alejandro Mulling on 02/23/2016 09:08:05 [name] ([id number]) -------------------------------------------------------------------------------- Pain Assessment Details Patient Name: [name] Date of Service: 02/23/2016 8:45 AM Medical Record Number: [id number] Patient Account Number: [id number] Date of Birth/Sex: [birth year] (70 y.o. Male) Treating RN: Phillis Haggis Primary Care Physician: Oretha Milch Other Clinician: Referring Physician: Oretha Milch Treating Physician/Extender: Ardath Sax Weeks in Treatment: 7 Active Problems Location of Pain Severity and Description of Pain Patient Has Paino No Site Locations Pain Management and Medication Current Pain Management: Electronic Signature(s) Signed: 02/23/2016 1:16:43 PM By: Alejandro Mulling Entered By: Alejandro Mulling on 02/23/2016 08:42:11 [name] ([id number]) -------------------------------------------------------------------------------- Patient/Caregiver Education Details Patient Name: [name] Date of Service: 02/23/2016 8:45 AM Medical Record Number: [id number] Patient Account Number: [id number] Date of Birth/Gender:  06-Jun-1946 (70 y.o. Male) Treating RN: Phillis Haggis Primary Care Physician: Oretha Milch Other Clinician: Referring Physician: Oretha Milch Treating Physician/Extender: Elayne Snare in Treatment: 7 Education Assessment Education Provided To: Patient Education Topics Provided Wound/Skin Impairment: Handouts: Other: change dressings as directed and do not get wraps wet Methods: Demonstration, Explain/Verbal Responses: State content correctly Electronic Signature(s) Signed: 02/23/2016 1:16:43 PM By: Alejandro Mulling Previous Signature: 02/23/2016 10:42:12 AM Version By: Ardath Sax MD Entered By: Alejandro Mulling on 02/23/2016 10:48:45 [name] ([id number]) -------------------------------------------------------------------------------- Wound Assessment Details Patient Name: [name] Date of Service: 02/23/2016 8:45 AM Medical Record Number: [id number] Patient Account Number: [id number] Date of Birth/Sex: [birth year] (70 y.o. Male) Treating RN: Phillis Haggis Primary Care Physician: Oretha Milch Other Clinician: Referring Physician: Oretha Milch Treating Physician/Extender: Ardath Sax Weeks in Treatment: 7 Wound Status Wound Number: 1 Primary Venous Leg Ulcer Etiology: Wound Location: Left Lower Leg Wound Open Wounding Event: Gradually Appeared Status: Date Acquired: 12/31/2014 Comorbid Cataracts, Chronic Obstructive Weeks Of Treatment: 7 History: Pulmonary Disease (COPD), Sleep Clustered Wound: No Apnea, Angina, Congestive Heart Failure, Hypertension, Type II Diabetes, Gout, Osteoarthritis, Neuropathy Photos Wound Measurements Length: (cm) 6 Width: (cm) 5.5 Depth: (cm) 0.1 Area: (cm) 25.918 Volume: (cm) 2.592 % Reduction in Area: -5.8% % Reduction in Volume: -5.8% Epithelialization: None Tunneling: No Undermining: No Wound Description Classification: Partial Thickness Foul Odor Af Diabetic Severity (Wagner): Grade 1 Wound Margin:  Flat and Intact Exudate Amount: Large Exudate Type: Serosanguineous Exudate Color: red, brown ter Cleansing: No Wound Bed Granulation Amount: Large (67-100%) Exposed Structure Granulation Quality: Pink Fascia Exposed: No [name], [name] ([id number]) Necrotic Amount: Small (1-33%) Fat Layer Exposed: No Necrotic Quality: Adherent Slough Tendon Exposed: No Muscle Exposed: No Joint Exposed: No Bone Exposed: No Limited to Skin Breakdown Periwound Skin Texture Texture Color No Abnormalities Noted: No No Abnormalities Noted: No Localized Edema: Yes Erythema: Yes Erythema Location: Circumferential Moisture No Abnormalities Noted: No Temperature / Pain  Moist: Yes Temperature: No Abnormality Tenderness on Palpation: Yes Wound Preparation Ulcer Cleansing: Other: soap and water, Topical Anesthetic Applied: Other: lidocaine 4%, Treatment Notes Wound #1 (Left Lower Leg) 1. Cleansed with: Cleanse wound with antibacterial soap and water 2. Anesthetic Topical Lidocaine 4% cream to wound bed prior to debridement 4. Dressing Applied: Aquacel Ag 5. Secondary Dressing Applied ABD Pad 7. Secured with Tape 3 Layer Compression System - Bilateral Electronic Signature(s) Signed: 02/23/2016 1:16:43 PM By: Alejandro Mulling Entered By: Alejandro Mulling on 02/23/2016 13:15:10 [name] ([id number]) -------------------------------------------------------------------------------- Wound Assessment Details Patient Name: [name] Date of Service: 02/23/2016 8:45 AM Medical Record Number: [id number] Patient Account Number: [id number] Date of Birth/Sex: [birth year] (70 y.o. Male) Treating RN: Phillis Haggis Primary Care Physician: Oretha Milch Other Clinician: Referring Physician: Oretha Milch Treating Physician/Extender: Ardath Sax Weeks in Treatment: 7 Wound Status Wound Number: 2 Primary Venous Leg Ulcer Etiology: Wound Location: Right Lower Leg Wound Open Wounding  Event: Gradually Appeared Status: Date Acquired: 12/31/2014 Comorbid Cataracts, Chronic Obstructive Weeks Of Treatment: 7 History: Pulmonary Disease (COPD), Sleep Clustered Wound: No Apnea, Angina, Congestive Heart Failure, Hypertension, Type II Diabetes, Gout, Osteoarthritis, Neuropathy Photos Wound Measurements Length: (cm) 5.7 Width: (cm) 2 Depth: (cm) 0.1 Area: (cm) 8.954 Volume: (cm) 0.895 % Reduction in Area: -3694.1% % Reduction in Volume: -3629.2% Epithelialization: Large (67-100%) Tunneling: No Undermining: No Wound Description Classification: Partial Thickness Foul Odor A Diabetic Severity (Wagner): Grade 1 Wound Margin: Flat and Intact Exudate Amount: None Present fter Cleansing: No Wound Bed Granulation Amount: None Present (0%) Exposed Structure Necrotic Amount: None Present (0%) Fascia Exposed: No Fat Layer Exposed: No Tendon Exposed: No [name], [name] ([id number]) Muscle Exposed: No Joint Exposed: No Bone Exposed: No Limited to Skin Breakdown Periwound Skin Texture Texture Color No Abnormalities Noted: No No Abnormalities Noted: No Localized Edema: No Erythema: Yes Erythema Location: Circumferential Moisture No Abnormalities Noted: No Temperature / Pain Moist: No Temperature: No Abnormality Tenderness on Palpation: Yes Wound Preparation Ulcer Cleansing: Other: soap and water, Treatment Notes Wound #2 (Right Lower Leg) 1. Cleansed with: Cleanse wound with antibacterial soap and water 2. Anesthetic Topical Lidocaine 4% cream to wound bed prior to debridement 4. Dressing Applied: Aquacel Ag 5. Secondary Dressing Applied ABD Pad 7. Secured with Tape 3 Layer Compression System - Bilateral Electronic Signature(s) Signed: 02/23/2016 1:16:43 PM By: Alejandro Mulling Entered By: Alejandro Mulling on 02/23/2016 13:15:27 [name] ([id number]) -------------------------------------------------------------------------------- Wound  Assessment Details Patient Name: [name] Date of Service: 02/23/2016 8:45 AM Medical Record Number: [id number] Patient Account Number: [id number] Date of Birth/Sex: 21-Feb-1946 (70 y.o. Male) Treating RN: Phillis Haggis Primary Care Physician: Oretha Milch Other Clinician: Referring Physician: Oretha Milch Treating Physician/Extender: Ardath Sax Weeks in Treatment: 7 Wound Status Wound Number: 3 Primary Pressure Ulcer Etiology: Wound Location: Right Calcaneus Wound Open Wounding Event: Pressure Injury Status: Date Acquired: 10/31/2015 Comorbid Cataracts, Chronic Obstructive Weeks Of Treatment: 7 History: Pulmonary Disease (COPD), Sleep Clustered Wound: No Apnea, Angina, Congestive Heart Failure, Hypertension, Type II Diabetes, Gout, Osteoarthritis, Neuropathy Photos Photo Uploaded By: Alejandro Mulling on 02/23/2016 13:13:49 Wound Measurements Length: (cm) 2.5 Width: (cm) 4.3 Depth: (cm) 0.2 Area: (cm) 8.443 Volume: (cm) 1.689 % Reduction in Area: -19.4% % Reduction in Volume: -19.4% Epithelialization: None Tunneling: No Undermining: No Wound Description Classification: Category/Stage III Foul Odor Afte Diabetic Severity (Wagner): Grade 1 Wound Margin: Flat and Intact Exudate Amount: Large Exudate Type: Serous Exudate Color: amber r Cleansing: No Wound Bed Granulation Amount: Small (1-33%)  Exposed Structure [place], Dequante V. ([id number]) Granulation Quality: Red, Pink Fascia Exposed: No Necrotic Amount: Large (67-100%) Fat Layer Exposed: No Necrotic Quality: Eschar, Adherent Slough Tendon Exposed: No Muscle Exposed: No Joint Exposed: No Bone Exposed: No Limited to Skin Breakdown Periwound Skin Texture Texture Color No Abnormalities Noted: No No Abnormalities Noted: No Localized Edema: Yes Erythema: Yes Erythema Location: Circumferential Moisture No Abnormalities Noted: No Temperature / Pain Maceration: Yes Temperature: No  Abnormality Moist: Yes Tenderness on Palpation: Yes Wound Preparation Ulcer Cleansing: Rinsed/Irrigated with Saline Topical Anesthetic Applied: Other: lidocaine 4%, Treatment Notes Wound #3 (Right Calcaneus) 1. Cleansed with: Cleanse wound with antibacterial soap and water 2. Anesthetic Topical Lidocaine 4% cream to wound bed prior to debridement 4. Dressing Applied: Santyl Ointment 5. Secondary Dressing Applied ABD Pad Dry Gauze Kerlix/Conform 7. Secured with Secretary/administrator) Signed: 02/23/2016 1:16:43 PM By: Alejandro Mulling Entered By: Alejandro Mulling on 02/23/2016 09:00:22 [name] ([id number]) -------------------------------------------------------------------------------- Wound Assessment Details Patient Name: [name] Date of Service: 02/23/2016 8:45 AM Medical Record Number: [id number] Patient Account Number: [id number] Date of Birth/Sex: Sep 23, 1946 (70 y.o. Male) Treating RN: Phillis Haggis Primary Care Physician: Oretha Milch Other Clinician: Referring Physician: Oretha Milch Treating Physician/Extender: Ardath Sax Weeks in Treatment: 7 Wound Status Wound Number: 5 Primary Etiology: Trauma, Other Wound Location: Right, Dorsal Toe Fifth Wound Status: Healed - Epithelialized Wounding Event: Trauma Date Acquired: 01/26/2016 Weeks Of Treatment: 4 Clustered Wound: No Photos Photo Uploaded By: Alejandro Mulling on 02/23/2016 13:14:04 Wound Measurements Length: (cm) 0 % Reduction i Width: (cm) 0 % Reduction i Depth: (cm) 0 Area: (cm) 0 Volume: (cm) 0 n Area: 100% n Volume: 100% Wound Description Classification: Partial Thickness Periwound Skin Texture Texture Color No Abnormalities Noted: No No Abnormalities Noted: No Moisture No Abnormalities Noted: No Electronic Signature(s) Signed: 02/23/2016 1:16:43 PM By: Evette Georges, Jailen V. ([id number]) Entered By: Alejandro Mulling on 02/23/2016 09:00:34 [name] ([id number]) -------------------------------------------------------------------------------- Wound Assessment Details Patient Name: [name] Date of Service: 02/23/2016 8:45 AM Medical Record Number: [id number] Patient Account Number: [id number] Date of Birth/Sex: 03-06-1946 (70 y.o. Male) Treating RN: Phillis Haggis Primary Care Physician: Oretha Milch Other Clinician: Referring Physician: Oretha Milch Treating Physician/Extender: Ardath Sax Weeks in Treatment: 7 Wound Status Wound Number: 6 Primary Pressure Ulcer Etiology: Wound Location: Right Foot - Lateral Wound Open Wounding Event: Pressure Injury Status: Date Acquired: 02/16/2016 Comorbid Cataracts, Chronic Obstructive Weeks Of Treatment: 1 History: Pulmonary Disease (COPD), Sleep Clustered Wound: No Apnea, Angina, Congestive Heart Failure, Hypertension, Type II Diabetes, Gout, Osteoarthritis, Neuropathy Photos Photo Uploaded By: Alejandro Mulling on 02/23/2016 13:13:50 Wound Measurements Length: (cm) 0.5 Width: (cm) 0.5 Depth: (cm) 0.1 Area: (cm) 0.196 Volume: (cm) 0.02 % Reduction in Area: -24.8% % Reduction in Volume: -25% Epithelialization: None Tunneling: No Undermining: No Wound Description Classification: Category/Stage II Diabetic Severity [address]): Grade 1 Wound Margin: Flat and Intact Exudate Amount: Large Exudate Type: Serosanguineous Exudate Color: red, brown Foul Odor After Cleansing: No Wound Bed Granulation Amount: Large (67-100%) Exposed Structure [name], [name] ([id number]) Granulation Quality: Red Fascia Exposed: No Necrotic Amount: Small (1-33%) Fat Layer Exposed: No Necrotic Quality: Eschar, Adherent Slough Tendon Exposed: No Muscle Exposed: No Joint Exposed: No Bone Exposed: No Limited to Skin Breakdown Periwound Skin Texture Texture Color No Abnormalities Noted: No No Abnormalities Noted: No Moisture Temperature / Pain No Abnormalities Noted:  No Temperature: No Abnormality Moist: Yes Tenderness on Palpation: Yes Wound Preparation Ulcer Cleansing: Rinsed/Irrigated with Saline Topical Anesthetic Applied:  Other: lidocaine 4%, Treatment Notes Wound #6 (Right, Lateral Foot) 1. Cleansed with: Cleanse wound with antibacterial soap and water 2. Anesthetic Topical Lidocaine 4% cream to wound bed prior to debridement 4. Dressing Applied: Aquacel Ag 5. Secondary Dressing Applied ABD Pad 7. Secured with Tape 3 Layer Compression System - Bilateral Electronic Signature(s) Signed: 02/23/2016 1:16:43 PM By: Alejandro MullingPinkerton, Debra Entered By: Alejandro MullingPinkerton, Debra on 02/23/2016 08:59:58 [name], [name] VMarland Kitchen. ([id number]) -------------------------------------------------------------------------------- Wound Assessment Details Patient Name: [name] OttoLINDLEY, [name] Date of Service: 02/23/2016 8:45 AM Medical Record Number: [id number] Patient Account Number: [id number] Date of Birth/Sex: 01/23/1946 5(70 y.o. Male) Treating RN: Phillis HaggisPinkerton, Debi Primary Care Physician: Oretha MilchSMITH, [name] Other Clinician: Referring Physician: Oretha MilchSMITH, [name] Treating Physician/Extender: Ardath SaxPARKER, PETER Weeks in Treatment: 7 Wound Status Wound Number: 7 Primary Pressure Ulcer Etiology: Wound Location: Coccyx - Midline Wound Open Wounding Event: Pressure Injury Status: Date Acquired: 02/23/2016 Comorbid Cataracts, Chronic Obstructive Weeks Of Treatment: 0 History: Pulmonary Disease (COPD), Sleep Clustered Wound: No Apnea, Angina, Congestive Heart Failure, Hypertension, Type II Diabetes, Gout, Osteoarthritis, Neuropathy Wound Measurements Length: (cm) 5.5 Width: (cm) 5.5 Depth: (cm) 0.1 Area: (cm) 23.758 Volume: (cm) 2.376 % Reduction in Area: % Reduction in Volume: Epithelialization: None Tunneling: No Wound Description Classification: Category/Stage II Wound Margin: Flat and Intact Exudate Amount: Small Exudate Type: Serosanguineous Exudate Color: red, brown Foul  Odor After Cleansing: No Wound Bed Granulation Amount: Large (67-100%) Exposed Structure Granulation Quality: Red Fascia Exposed: No Necrotic Amount: Small (1-33%) Fat Layer Exposed: No Necrotic Quality: Eschar, Adherent Slough Tendon Exposed: No Muscle Exposed: No Joint Exposed: No Bone Exposed: No Limited to Skin Breakdown Periwound Skin Texture Texture Color No Abnormalities Noted: No No Abnormalities Noted: No Erythema: Yes Moisture [name], [name] ([id number]) No Abnormalities Noted: No Erythema Location: Circumferential Moist: Yes Temperature / Pain Temperature: No Abnormality Tenderness on Palpation: Yes Wound Preparation Ulcer Cleansing: Rinsed/Irrigated with Saline Topical Anesthetic Applied: Other: lidocaine 4%, Treatment Notes Wound #7 (Midline Coccyx) 1. Cleansed with: Clean wound with Normal Saline 2. Anesthetic Topical Lidocaine 4% cream to wound bed prior to debridement 4. Dressing Applied: Aquacel Ag 5. Secondary Dressing Applied Bordered Foam Dressing Electronic Signature(s) Signed: 02/23/2016 1:16:43 PM By: Alejandro MullingPinkerton, Debra Entered By: Alejandro MullingPinkerton, Debra on 02/23/2016 09:06:23 [name] OttoLINDLEY, [name] ([id number]) -------------------------------------------------------------------------------- Vitals Details Patient Name: [name] OttoLINDLEY, [name] Date of Service: 02/23/2016 8:45 AM Medical Record Number: [id number] Patient Account Number: [id number] Date of Birth/Sex: 01/23/1946 [age] y.o. Male) Treating RN: Phillis HaggisPinkerton, Debi Primary Care Physician: Oretha MilchSMITH, [name] Other Clinician: Referring Physician: Oretha MilchSMITH, [name] Treating Physician/Extender: Ardath SaxPARKER, PETER Weeks in Treatment: 7 Vital Signs Time Taken: 08:42 Temperature (F): 97.5 Height (in): 70 Pulse (bpm): 68 Weight (lbs): 235 Respiratory Rate (breaths/min): 20 Body Mass Index (BMI): 33.7 Blood Pressure (mmHg): 127/68 Reference Range: 80 - 120 mg / dl Electronic Signature(s) Signed: 02/23/2016 1:16:43 PM By:  Alejandro MullingPinkerton, Debra Entered By: Alejandro MullingPinkerton, Debra on 02/23/2016 08:42:44

## 2016-02-23 NOTE — Progress Notes (Signed)
Decubitus right heel.  Diabetes.  See I heal note

## 2016-02-27 NOTE — Progress Notes (Signed)
[name], [name] ([id number]) Visit Report for 02/23/2016 Chief Complaint Document Details Patient Name: [name], [name] Date of Service: 02/23/2016 8:45 AM Medical Record Number: [id number] Patient Account Number: [id number] Date of Birth/Sex: 1945-12-11 (70 y.o. Male) Treating RN: [name] Primary Care Physician: [name] Other Clinician: Referring Physician: [name] Treating Physician/Extender: [name] in Treatment: 7 Information Obtained from: Patient Chief Complaint Patient is at the clinic for treatment of an open pressure ulcer the left upper thigh and gluteal region and the right heel with bilateral swelling of the legs all of it which is going on for over a year Electronic Signature(s) Signed: 02/23/2016 10:37:01 AM By: [name] Sax MD Entered By: [name] on 02/23/2016 10:37:01 [name] ([id number]) -------------------------------------------------------------------------------- Debridement Details Patient Name: [name] Date of Service: 02/23/2016 8:45 AM Medical Record Number: [id number] Patient Account Number: [id number] Date of Birth/Sex: 03-Jun-1946 (70 y.o. Male) Treating RN: [name] Primary Care Physician: [name] Other Clinician: Referring Physician: [name] Treating Physician/Extender: [name] in Treatment: 7 Debridement Performed for Wound #3 Right Calcaneus Assessment: Performed By: Physician [name] Sax, MD Debridement: Debridement Pre-procedure Yes Verification/Time Out Taken: Start Time: 09:20 Pain Control: Lidocaine 4% Topical Solution Level: Skin/Subcutaneous Tissue Total Area Debrided (L x 2.5 (cm) x 4.3 (cm) = 10.75 (cm) W): Tissue and other Viable, Non-Viable, Eschar, Fibrin/Slough, Subcutaneous material debrided: Instrument: Curette, Forceps, Scissors Bleeding: Minimum Hemostasis Achieved: Pressure End Time: 09:22 Procedural Pain: 4 Post Procedural Pain:  0 Response to Treatment: Procedure was tolerated well Post Debridement Measurements of Total Wound Length: (cm) 2.5 Stage: Category/Stage III Width: (cm) 4.3 Depth: (cm) 0.2 Volume: (cm) 1.689 Post Procedure Diagnosis Same as Pre-procedure Electronic Signature(s) Signed: 02/23/2016 1:41:43 PM By: [name] Signed: 02/26/2016 3:38:49 PM By: [name] Sax MD Entered By: [name] on 02/23/2016 09:22:58 [name] ([id number]) -------------------------------------------------------------------------------- HPI Details Patient Name: [name] Date of Service: 02/23/2016 8:45 AM Medical Record Number: [id number] Patient Account Number: [id number] Date of Birth/Sex: 02-11-1946 (70 y.o. Male) Treating RN: [name] Primary Care Physician: [name] Other Clinician: Referring Physician: [name] Treating Physician/Extender: [name] in Treatment: 7 History of Present Illness Location: ulcerated area on the right heel, left gluteal region and thigh and then bilateral lower extremities Quality: Patient reports experiencing a sharp pain to affected area(s). Severity: Patient states wound are getting worse. Duration: Patient has had the wound for > 12 months prior to seeking treatment at the wound center Timing: Pain in wound is constant (hurts all the time) Context: The wound appeared gradually over time Modifying Factors: Other treatment(s) tried include: he sees his heart doctor and his primary care doctor Associated Signs and Symptoms: patient has not been able to walk for over a year now HPI Description: 70 year old gentleman with a known history of hypertension, diabetes, obstructive sleep apnea, COPD, diastolic CHF, coronary artery disease was admitted to the hospital with sepsis from an ulcer of the right heel and was treated there in October 2016. he also has chronic bilateral lower extremity edema and lymphedema. He had received  vancomycin, Zosyn and at that stage and x-rays showed hardware in the right ankle but no evidence of osteomyelitis. he was a former smoker. he was also treated with Augmentin orally for 10 days. He is either bedbound or wheelchair-bound and does not ambulate by himself. 01/19/2016 -- he has not been seen here for 3 weeks and this was because he was admitted to [facility]  between 223 and 01/08/2016 for sepsis, UTI and pneumonia involving the left lung. he was treated with IV vancomycin and Zosyn and then meropenem. Was discharged home on oral Bactrim for 2 weeks none of his vascular test or x-rays were done and we will reorder these. 01/26/2016 -- he has not yet done the x-ray of his foot and is vascular tests are still pending. I have asked him to work on these with his nursing home staff. Addendum: he has got an x-ray of the right foot done which shows that his osteopenia but no specific ostial lysis or abnormal periosteal reaction. Final impression was degenerative changes with dorsal foot soft tissue swelling. 02/16/2016 -- lower extremity arterial duplex examination shows a 50-99% stenosis of the right tibioperoneal trunk. He had biphasic flow in the right SFA, popliteal and tibioperoneal trunk. Left-sided he had triphasic flow throughout Electronic Signature(s) Signed: 02/23/2016 10:37:56 AM By: [name] Sax MD Entered By: [name] on 02/23/2016 10:37:56 [name] ([id number]) -------------------------------------------------------------------------------- Physical Exam Details Patient Name: [name] Date of Service: 02/23/2016 8:45 AM Medical Record Number: [id number] Patient Account Number: [id number] Date of Birth/Sex: January 04, 1946 (70 y.o. Male) Treating RN: [name] Primary Care Physician: [name] Other Clinician: Referring Physician: [name] Treating Physician/Extender: [name] Weeks in Treatment: 7 Electronic  Signature(s) Signed: 02/23/2016 10:38:04 AM By: [name] Sax MD Entered By: [name] on 02/23/2016 10:38:04 [name] ([id number]) -------------------------------------------------------------------------------- Physician Orders Details Patient Name: [name] Date of Service: 02/23/2016 8:45 AM Medical Record Number: [id number] Patient Account Number: [id number] Date of Birth/Sex: 12/05/1945 (70 y.o. Male) Treating RN: [name] Primary Care Physician: [name] Other Clinician: Referring Physician: [name] Treating Physician/Extender: [name] in Treatment: 7 Verbal / Phone Orders: Yes Clinician: Curtis [name] Read Back and Verified: Yes Diagnosis Coding Wound Cleansing Wound #1 Left Lower Leg o May shower with protection. - please cover wraps and keep dry o No tub bath. Wound #2 Right Lower Leg o May shower with protection. - please cover wraps and keep dry o No tub bath. Wound #3 Right Calcaneus o May shower with protection. - please cover wraps and keep dry o No tub bath. Wound #6 Right,Lateral Foot o May shower with protection. - please cover wraps and keep dry o No tub bath. Wound #7 Midline Coccyx o May shower with protection. - please cover wraps and keep dry o No tub bath. Anesthetic Wound #1 Left Lower Leg o Topical Lidocaine 4% cream applied to wound bed prior to debridement Wound #2 Right Lower Leg o Topical Lidocaine 4% cream applied to wound bed prior to debridement Wound #3 Right Calcaneus o Topical Lidocaine 4% cream applied to wound bed prior to debridement Wound #6 Right,Lateral Foot o Topical Lidocaine 4% cream applied to wound bed prior to debridement Wound #7 Midline Coccyx o Topical Lidocaine 4% cream applied to wound bed prior to debridement [name], [name] ([id number]) Skin Barriers/Peri-Wound Care Wound #7 Midline Coccyx o Skin Prep o Barrier cream Primary Wound  Dressing Wound #1 Left Lower Leg o Aquacel Ag - ******IF THE PT MISSES HIS APPT FOR THE WEEK NURSING AT THE FACILITY NEEDS TO CHANGE WRAPS AND DRESSINGS ON LEGS AND FOOT***** Wound #2 Right Lower Leg o Aquacel Ag - ******IF THE PT MISSES HIS APPT FOR THE WEEK NURSING AT THE FACILITY NEEDS TO CHANGE WRAPS AND DRESSINGS ON LEGS AND FOOT***** Wound #3 Right Calcaneus o Santyl Ointment Wound #6 Right,Lateral Foot o Aquacel Ag - ******  IF THE PT MISSES HIS APPT FOR THE WEEK NURSING AT THE FACILITY NEEDS TO CHANGE WRAPS AND DRESSINGS ON LEGS AND FOOT***** Wound #7 Midline Coccyx o Aquacel Ag Secondary Dressing Wound #1 Left Lower Leg o ABD pad - ******IF THE PT MISSES HIS APPT FOR THE WEEK NURSING AT THE FACILITY NEEDS TO CHANGE WRAPS AND DRESSINGS ON LEGS AND FOOT***** Wound #2 Right Lower Leg o ABD pad - ******IF THE PT MISSES HIS APPT FOR THE WEEK NURSING AT THE FACILITY NEEDS TO CHANGE WRAPS AND DRESSINGS ON LEGS AND FOOT***** Wound #3 Right Calcaneus o ABD pad Wound #6 Right,Lateral Foot o ABD pad - ******IF THE PT MISSES HIS APPT FOR THE WEEK NURSING AT THE FACILITY NEEDS TO CHANGE WRAPS AND DRESSINGS ON LEGS AND FOOT***** Wound #7 Midline Coccyx o Boardered Foam Dressing Dressing Change Frequency Wound #1 Left Lower Leg o Change dressing every week - in clinic ******IF THE PT MISSES HIS APPT FOR THE WEEK NURSING AT THE FACILITY NEEDS TO CHANGE WRAPS AND DRESSINGS ON LEGS AND FOOT***** [name], [name] ([id number]) Wound #2 Right Lower Leg o Change dressing every week - in clinic ******IF THE PT MISSES HIS APPT FOR THE WEEK NURSING AT THE FACILITY NEEDS TO CHANGE WRAPS AND DRESSINGS ON LEGS AND FOOT***** Wound #3 Right Calcaneus o Change dressing every day. - to be changed daily by SNF RN Wound #6 Right,Lateral Foot o Change dressing every week - in clinic ******IF THE PT MISSES HIS APPT FOR THE WEEK NURSING AT THE FACILITY NEEDS TO CHANGE WRAPS  AND DRESSINGS ON LEGS AND FOOT***** Wound #7 Midline Coccyx o Change dressing every day. - to be changed daily by SNF RN Follow-up Appointments Wound #1 Left Lower Leg o Return Appointment in 1 week. - ******PLEASE MAKE SURE PATIENT COMES TO HIS WEEKLY APPTS, HE HAS TO HAVE HIS WRAPS CHANGED.****** ******IF THE PT MISSES HIS APPT FOR THE WEEK NURSING AT THE FACILITY NEEDS TO CHANGE WRAPS AND DRESSINGS ON LEGS AND FOOT***** Wound #2 Right Lower Leg o Return Appointment in 1 week. - ******PLEASE MAKE SURE PATIENT COMES TO HIS WEEKLY APPTS, HE HAS TO HAVE HIS WRAPS CHANGED.****** ******IF THE PT MISSES HIS APPT FOR THE WEEK NURSING AT THE FACILITY NEEDS TO CHANGE WRAPS AND DRESSINGS ON LEGS AND FOOT***** Wound #3 Right Calcaneus o Return Appointment in 1 week. - ******PLEASE MAKE SURE PATIENT COMES TO HIS WEEKLY APPTS, HE HAS TO HAVE HIS WRAPS CHANGED.****** ******IF THE PT MISSES HIS APPT FOR THE WEEK NURSING AT THE FACILITY NEEDS TO CHANGE WRAPS AND DRESSINGS ON LEGS AND FOOT***** Wound #6 Right,Lateral Foot o Return Appointment in 1 week. - ******PLEASE MAKE SURE PATIENT COMES TO HIS WEEKLY APPTS, HE HAS TO HAVE HIS WRAPS CHANGED.****** ******IF THE PT MISSES HIS APPT FOR THE WEEK NURSING AT THE FACILITY NEEDS TO CHANGE WRAPS AND DRESSINGS ON LEGS AND FOOT***** Wound #7 Midline Coccyx o Return Appointment in 1 week. - ******PLEASE MAKE SURE PATIENT COMES TO HIS WEEKLY APPTS, HE HAS TO HAVE HIS WRAPS CHANGED.****** ******IF THE PT MISSES HIS APPT FOR THE WEEK NURSING AT THE FACILITY NEEDS TO CHANGE WRAPS AND DRESSINGS ON LEGS AND FOOT***** Edema Control [place], Daymeon V. ([id number]) Wound #1 Left Lower Leg o 3 Layer Compression System - Bilateral - unna to achor ******IF THE PT MISSES HIS APPT FOR THE WEEK NURSING AT THE FACILITY NEEDS TO CHANGE WRAPS AND DRESSINGS ON LEGS AND FOOT***** o Elevate legs to the level of the heart and  pump ankles as often as possible -  ******IF THE PT MISSES HIS APPT FOR THE WEEK NURSING AT THE FACILITY NEEDS TO CHANGE WRAPS AND DRESSINGS ON LEGS AND FOOT***** Wound #2 Right Lower Leg o 3 Layer Compression System - Bilateral - unna to achor ******IF THE PT MISSES HIS APPT FOR THE WEEK NURSING AT THE FACILITY NEEDS TO CHANGE WRAPS AND DRESSINGS ON LEGS AND FOOT***** o Elevate legs to the level of the heart and pump ankles as often as possible - ******IF THE PT MISSES HIS APPT FOR THE WEEK NURSING AT THE FACILITY NEEDS TO CHANGE WRAPS AND DRESSINGS ON LEGS AND FOOT***** Wound #3 Right Calcaneus o 3 Layer Compression System - Bilateral - unna to achor ******IF THE PT MISSES HIS APPT FOR THE WEEK NURSING AT THE FACILITY NEEDS TO CHANGE WRAPS AND DRESSINGS ON LEGS AND FOOT***** o Elevate legs to the level of the heart and pump ankles as often as possible - ******IF THE PT MISSES HIS APPT FOR THE WEEK NURSING AT THE FACILITY NEEDS TO CHANGE WRAPS AND DRESSINGS ON LEGS AND FOOT***** Wound #6 Right,Lateral Foot o 3 Layer Compression System - Bilateral - unna to achor ******IF THE PT MISSES HIS APPT FOR THE WEEK NURSING AT THE FACILITY NEEDS TO CHANGE WRAPS AND DRESSINGS ON LEGS AND FOOT***** o Elevate legs to the level of the heart and pump ankles as often as possible - ******IF THE PT MISSES HIS APPT FOR THE WEEK NURSING AT THE FACILITY NEEDS TO CHANGE WRAPS AND DRESSINGS ON LEGS AND FOOT***** Off-Loading Wound #1 Left Lower Leg o Turn and reposition every 2 hours o Other: - sage boots Wound #2 Right Lower Leg o Turn and reposition every 2 hours o Other: - sage boots Wound #3 Right Calcaneus o Turn and reposition every 2 hours o Other: - sage boots Wound #6 Right,Lateral Foot o Turn and reposition every 2 hours o Other: - sage boots [name], [place] V. ([id number]) Wound #7 Midline Coccyx o Turn and reposition every 2 hours o Other: - sage boots Electronic Signature(s) Signed:  02/26/2016 3:38:49 PM By: [name] Sax MD Signed: 02/26/2016 5:04:00 PM By: Alejandro Mulling Entered By: Alejandro Mulling on 02/23/2016 13:28:55 [name] ([id number]) -------------------------------------------------------------------------------- Problem List Details Patient Name: [name] Date of Service: 02/23/2016 8:45 AM Medical Record Number: [id number] Patient Account Number: [id number] Date of Birth/Sex: 08/20/1946 (70 y.o. Male) Treating RN: [name] Primary Care Physician: [name] Other Clinician: Referring Physician: [name] Treating Physician/Extender: [name] in Treatment: 7 Active Problems ICD-10 Encounter Code Description Active Date Diagnosis E11.621 Type 2 diabetes mellitus with foot ulcer 01/01/2016 Yes L89.613 Pressure ulcer of right heel, stage 3 01/01/2016 Yes E66.01 Morbid (severe) obesity due to excess calories 01/01/2016 Yes I89.0 Lymphedema, not elsewhere classified 01/01/2016 Yes M70.871 Other soft tissue disorders related to use, overuse and 01/19/2016 Yes pressure, right ankle and foot 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 Date of Service: 02/23/2016 8:45 AM Medical Record Number: [id number] Patient Account Number: [id number] Date of Birth/Sex: 05-Dec-1945 (70 y.o. Male) Treating RN: [name] Primary Care Physician: [name] Other  Clinician: Referring Physician: [name] Treating Physician/Extender: [name] in Treatment: 7 Subjective Chief Complaint Information obtained from Patient Patient is at the clinic for treatment of an open pressure ulcer the left upper thigh and gluteal region and the right heel with bilateral swelling of the legs all of it which is going on for over a year History of Present Illness (HPI) The following HPI elements were documented for the patient's wound: Location: ulcerated area on the right heel, left gluteal region and thigh and then bilateral lower extremities Quality: Patient reports experiencing a sharp pain to affected area(s). Severity: Patient states wound are getting worse. Duration: Patient has had the wound for > 12 months prior to seeking treatment at the wound center Timing: Pain in wound is constant (hurts all the time) Context: The wound appeared gradually over time Modifying Factors: Other treatment(s) tried include: he sees his heart doctor and his primary care doctor Associated Signs and Symptoms: patient has not been able to walk for over a year now 70 year old gentleman with a known history of hypertension, diabetes, obstructive sleep apnea, COPD, diastolic CHF, coronary artery disease was admitted to the hospital with sepsis from an ulcer of the right heel and was treated there in October 2016. he also has chronic bilateral lower extremity edema and lymphedema. He had received vancomycin, Zosyn and at that stage and x-rays showed hardware in the right ankle but no evidence of osteomyelitis. he was a former smoker. he was also treated with Augmentin orally for 10 days. He is either bedbound or wheelchair-bound and does not ambulate by himself. 01/19/2016 -- he has not been seen here for 3 weeks and this was because he was admitted to [facility] between 223 and 01/08/2016 for sepsis, UTI and pneumonia involving the left lung. he was treated with IV vancomycin and Zosyn  and then meropenem. Was discharged home on oral Bactrim for 2 weeks none of his vascular test or x-rays were done and we will reorder these. 01/26/2016 -- he has not yet done the x-ray of his foot and is vascular tests are still pending. I have asked him to work on these with his nursing home staff. Addendum: he has got an x-ray of the right foot done which shows that his osteopenia but no specific ostial lysis or abnormal periosteal reaction. Final impression was degenerative changes with dorsal foot soft tissue swelling. 02/16/2016 -- lower extremity arterial duplex examination shows a 50-99% stenosis of the right tibioperoneal trunk. He had biphasic flow in the right SFA, popliteal and tibioperoneal trunk. Left-sided he had triphasic flow throughout Cherney, Govani V. ([id number]) Objective Constitutional Vitals Time Taken: 8:42 AM, Height: 70 in, Weight: 235 lbs, BMI: 33.7, Temperature: 97.5 F, Pulse: 68 bpm, Respiratory Rate: 20 breaths/min, Blood Pressure: 127/68 mmHg. Integumentary (Hair, Skin) Wound #1 status is Open. Original cause of wound was Gradually Appeared. The wound is located on the Left Lower Leg. The wound measures 6cm length x 5.5cm width x 0.1cm depth; 25.918cm^2 area and 2.592cm^3 volume. The wound is limited to skin breakdown. There is no tunneling or undermining noted. There is a large amount of serosanguineous drainage noted. The wound margin is flat and intact. There is large (67-100%) pink granulation within the wound bed. There is a small (1-33%) amount of necrotic tissue within the wound bed including Adherent Slough. The periwound skin appearance exhibited: Localized Edema, Moist, Erythema. The surrounding wound skin color is noted with erythema which is circumferential. Periwound temperature  was noted as No Abnormality. The periwound has tenderness on palpation. Wound #2 status is Open. Original cause of wound was Gradually Appeared. The wound is located on  the Right Lower Leg. The wound measures 5.7cm length x 2cm width x 0.1cm depth; 8.954cm^2 area and 0.895cm^3 volume. The wound is limited to skin breakdown. There is no tunneling or undermining noted. There is a none present amount of drainage noted. The wound margin is flat and intact. There is no granulation within the wound bed. There is no necrotic tissue within the wound bed. The periwound skin appearance exhibited: Erythema. The periwound skin appearance did not exhibit: Localized Edema, Moist. The surrounding wound skin color is noted with erythema which is circumferential. Periwound temperature was noted as No Abnormality. The periwound has tenderness on palpation. Wound #3 status is Open. Original cause of wound was Pressure Injury. The wound is located on the Right Calcaneus. The wound measures 2.5cm length x 4.3cm width x 0.2cm depth; 8.443cm^2 area and 1.689cm^3 volume. The wound is limited to skin breakdown. There is no tunneling or undermining noted. There is a large amount of serous drainage noted. The wound margin is flat and intact. There is small (1-33%) red, pink granulation within the wound bed. There is a large (67-100%) amount of necrotic tissue within the wound bed including Eschar and Adherent Slough. The periwound skin appearance exhibited: Localized Edema, Maceration, Moist, Erythema. The surrounding wound skin color is noted with erythema which is circumferential. Periwound temperature was noted as No Abnormality. The periwound has tenderness on palpation. Wound #5 status is Healed - Epithelialized. Original cause of wound was Trauma. The wound is located on the Right,Dorsal Toe Fifth. The wound measures 0cm length x 0cm width x 0cm depth; 0cm^2 area and 0cm^3 volume. Wound #6 status is Open. Original cause of wound was Pressure Injury. The wound is located on the Right,Lateral Foot. The wound measures 0.5cm length x 0.5cm width x 0.1cm depth; 0.196cm^2 area  and 0.02cm^3 volume. The wound is limited to skin breakdown. There is no tunneling or undermining noted. There is a large amount of serosanguineous drainage noted. The wound margin is flat and intact. There is Ley, [place] V. ([id number]) large (67-100%) red granulation within the wound bed. There is a small (1-33%) amount of necrotic tissue within the wound bed including Eschar and Adherent Slough. The periwound skin appearance exhibited: Moist. Periwound temperature was noted as No Abnormality. The periwound has tenderness on palpation. Wound #7 status is Open. Original cause of wound was Pressure Injury. The wound is located on the Midline Coccyx. The wound measures 5.5cm length x 5.5cm width x 0.1cm depth; 23.758cm^2 area and 2.376cm^3 volume. The wound is limited to skin breakdown. There is no tunneling noted. There is a small amount of serosanguineous drainage noted. The wound margin is flat and intact. There is large (67-100%) red granulation within the wound bed. There is a small (1-33%) amount of necrotic tissue within the wound bed including Eschar and Adherent Slough. The periwound skin appearance exhibited: Moist, Erythema. The surrounding wound skin color is noted with erythema which is circumferential. Periwound temperature was noted as No Abnormality. The periwound has tenderness on palpation. 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 There was a Skin/Subcutaneous Tissue Debridement ([id number]) debridement with  total area of 10.75 sq cm performed by [name] Sax, MD. with the following instrument(s): Curette, Forceps, and Scissors to remove Viable and Non-Viable tissue/material including Fibrin/Slough, Eschar, and Subcutaneous after achieving pain control using Lidocaine 4% Topical Solution. A time out was conducted prior to the start of the procedure. A Minimum amount of bleeding was controlled with Pressure. The procedure was tolerated well with a pain level of 4 throughout and a pain level of 0 following the procedure. Post Debridement Measurements: 2.5cm length x 4.3cm width x 0.2cm depth; 1.689cm^3 volume. Post debridement Stage noted as Category/Stage III. Post procedure Diagnosis Wound #3: Same as Pre-Procedure Lanter, Ector V. ([id number]) Plan Wound Cleansing: Wound #1 Left Lower Leg: May shower with protection. - please cover wraps and keep dry No tub bath. Wound #2 Right Lower Leg: May shower with protection. - please cover wraps and keep dry No tub bath. Wound #3 Right Calcaneus: May shower with protection. - please cover wraps and keep dry No tub bath. Wound #6 Right,Lateral Foot: May shower with protection. - please cover wraps and keep dry No tub bath. Wound #7 Midline Coccyx: May shower with protection. - please cover wraps and keep dry No tub bath. Anesthetic: Wound #1 Left Lower Leg: Topical Lidocaine 4% cream applied to wound bed prior to debridement Wound #2 Right Lower Leg: Topical Lidocaine 4% cream applied to wound bed prior to debridement Wound #3 Right Calcaneus: Topical Lidocaine 4% cream applied to wound bed prior to debridement Wound #6 Right,Lateral Foot: Topical Lidocaine 4% cream applied to wound bed prior to debridement Wound #7 Midline Coccyx: Topical Lidocaine 4% cream applied to wound bed prior to debridement Skin Barriers/Peri-Wound Care: Wound #7 Midline Coccyx: Barrier cream Primary Wound Dressing: Wound #1 Left Lower Leg: Aquacel Ag -  ******IF THE PT MISSES HIS APPT FOR THE WEEK NURSING AT THE FACILITY NEEDS TO CHANGE WRAPS AND DRESSINGS ON LEGS AND FOOT***** Wound #2 Right Lower Leg: Aquacel Ag - ******IF THE PT MISSES HIS APPT FOR THE WEEK NURSING AT THE FACILITY NEEDS TO CHANGE WRAPS AND DRESSINGS ON LEGS AND FOOT***** Wound #6 Right,Lateral Foot: Aquacel Ag - ******IF THE PT MISSES HIS APPT FOR THE WEEK NURSING AT THE FACILITY NEEDS TO CHANGE WRAPS AND DRESSINGS ON LEGS AND FOOT***** Wound #7 Midline Coccyx: Aquacel Ag - ******IF THE PT MISSES HIS APPT FOR THE WEEK NURSING AT THE FACILITY NEEDS TO CHANGE WRAPS AND DRESSINGS ON LEGS AND FOOT***** Wound #3 Right Calcaneus: Santyl Ointment - ******IF THE PT MISSES HIS APPT FOR THE WEEK NURSING AT THE FACILITY NEEDS TO CHANGE WRAPS AND DRESSINGS ON LEGS AND FOOT***** Secondary Dressing: Menz, Kaston V. ([id number]) Wound #1 Left Lower Leg: ABD pad - ******IF THE PT MISSES HIS APPT FOR THE WEEK NURSING AT THE FACILITY NEEDS TO CHANGE WRAPS AND DRESSINGS ON LEGS AND FOOT***** Wound #2 Right Lower Leg: ABD pad - ******IF THE PT MISSES HIS APPT FOR THE WEEK NURSING AT THE FACILITY NEEDS TO CHANGE WRAPS AND DRESSINGS ON LEGS AND FOOT***** Wound #3 Right Calcaneus: ABD pad - ******IF THE PT MISSES HIS APPT FOR THE WEEK NURSING AT THE FACILITY NEEDS TO CHANGE WRAPS AND DRESSINGS ON LEGS AND FOOT***** Wound #6 Right,Lateral Foot: ABD pad - ******IF THE PT MISSES  HIS APPT FOR THE WEEK NURSING AT THE FACILITY NEEDS TO CHANGE WRAPS AND DRESSINGS ON LEGS AND FOOT***** Wound #7 Midline Coccyx: Boardered Foam Dressing Dressing Change Frequency: Wound #1 Left Lower Leg: Change dressing every week - in clinic ******IF THE PT MISSES HIS APPT FOR THE WEEK NURSING AT THE FACILITY NEEDS TO CHANGE WRAPS AND DRESSINGS ON LEGS AND FOOT***** Wound #2 Right Lower Leg: Change dressing every week - in clinic ******IF THE PT MISSES HIS APPT FOR THE WEEK NURSING AT THE FACILITY NEEDS TO  CHANGE WRAPS AND DRESSINGS ON LEGS AND FOOT***** Wound #3 Right Calcaneus: Change dressing every week - in clinic ******IF THE PT MISSES HIS APPT FOR THE WEEK NURSING AT THE FACILITY NEEDS TO CHANGE WRAPS AND DRESSINGS ON LEGS AND FOOT***** Wound #6 Right,Lateral Foot: Change dressing every week - in clinic ******IF THE PT MISSES HIS APPT FOR THE WEEK NURSING AT THE FACILITY NEEDS TO CHANGE WRAPS AND DRESSINGS ON LEGS AND FOOT***** Wound #7 Midline Coccyx: Change dressing every day. - to be changed daily by [facility] RN Follow-up Appointments: Wound #1 Left Lower Leg: Return Appointment in 1 week. - ******PLEASE MAKE SURE PATIENT COMES TO HIS WEEKLY APPTS, HE HAS TO HAVE HIS WRAPS CHANGED.****** ******IF THE PT MISSES HIS APPT FOR THE WEEK NURSING AT THE FACILITY NEEDS TO CHANGE WRAPS AND DRESSINGS ON LEGS AND FOOT***** Wound #2 Right Lower Leg: Return Appointment in 1 week. - ******PLEASE MAKE SURE PATIENT COMES TO HIS WEEKLY APPTS, HE HAS TO HAVE HIS WRAPS CHANGED.****** ******IF THE PT MISSES HIS APPT FOR THE WEEK NURSING AT THE FACILITY NEEDS TO CHANGE WRAPS AND DRESSINGS ON LEGS AND FOOT***** Wound #3 Right Calcaneus: Return Appointment in 1 week. - ******PLEASE MAKE SURE PATIENT COMES TO HIS WEEKLY APPTS, HE HAS TO HAVE HIS WRAPS CHANGED.****** ******IF THE PT MISSES HIS APPT FOR THE WEEK NURSING AT THE FACILITY NEEDS TO CHANGE WRAPS AND DRESSINGS ON LEGS AND FOOT***** Wound #6 Right,Lateral Foot: Return Appointment in 1 week. - ******PLEASE MAKE SURE PATIENT COMES TO HIS WEEKLY APPTS, HE HAS TO HAVE HIS WRAPS CHANGED.****** ******IF THE PT MISSES HIS APPT FOR THE WEEK NURSING AT THE FACILITY NEEDS TO CHANGE WRAPS AND DRESSINGS ON LEGS AND FOOT***** Wound #7 Midline Coccyx: Ahlberg, Marky V. ([id number]) Return Appointment in 1 week. - ******PLEASE MAKE SURE PATIENT COMES TO HIS WEEKLY APPTS, HE HAS TO HAVE HIS WRAPS CHANGED.****** ******IF THE PT MISSES HIS APPT FOR THE WEEK NURSING  AT THE FACILITY NEEDS TO CHANGE WRAPS AND DRESSINGS ON LEGS AND FOOT***** Edema Control: Wound #1 Left Lower Leg: 3 Layer Compression System - Bilateral - unna to achor ******IF THE PT MISSES HIS APPT FOR THE WEEK NURSING AT THE FACILITY NEEDS TO CHANGE WRAPS AND DRESSINGS ON LEGS AND FOOT***** Elevate legs to the level of the heart and pump ankles as often as possible - ******IF THE PT MISSES HIS APPT FOR THE WEEK NURSING AT THE FACILITY NEEDS TO CHANGE WRAPS AND DRESSINGS ON LEGS AND FOOT***** Wound #2 Right Lower Leg: 3 Layer Compression System - Bilateral - unna to achor ******IF THE PT MISSES HIS APPT FOR THE WEEK NURSING AT THE FACILITY NEEDS TO CHANGE WRAPS AND DRESSINGS ON LEGS AND FOOT***** Elevate legs to the level of the heart and pump ankles as often as possible - ******IF THE PT MISSES HIS APPT FOR THE WEEK NURSING AT THE FACILITY NEEDS TO CHANGE WRAPS AND DRESSINGS ON LEGS AND FOOT***** Wound #3 Right Calcaneus: 3 Layer  Compression System - Bilateral - unna to achor ******IF THE PT MISSES HIS APPT FOR THE WEEK NURSING AT THE FACILITY NEEDS TO CHANGE WRAPS AND DRESSINGS ON LEGS AND FOOT***** Elevate legs to the level of the heart and pump ankles as often as possible - ******IF THE PT MISSES HIS APPT FOR THE WEEK NURSING AT THE FACILITY NEEDS TO CHANGE WRAPS AND DRESSINGS ON LEGS AND FOOT***** Wound #6 Right,Lateral Foot: 3 Layer Compression System - Bilateral - unna to achor ******IF THE PT MISSES HIS APPT FOR THE WEEK NURSING AT THE FACILITY NEEDS TO CHANGE WRAPS AND DRESSINGS ON LEGS AND FOOT***** Elevate legs to the level of the heart and pump ankles as often as possible - ******IF THE PT MISSES HIS APPT FOR THE WEEK NURSING AT THE FACILITY NEEDS TO CHANGE WRAPS AND DRESSINGS ON LEGS AND FOOT***** Off-Loading: Wound #1 Left Lower Leg: Turn and reposition every 2 hours Other: - sage boots Wound #2 Right Lower Leg: Turn and reposition every 2 hours Other: - sage  boots Wound #3 Right Calcaneus: Turn and reposition every 2 hours Other: - sage boots Wound #6 Right,Lateral Foot: Turn and reposition every 2 hours Other: - sage boots Wound #7 Midline Coccyx: Turn and reposition every 2 hours Other: - sage boots Rosero, Norval V. ([id number]) Follow-Up Appointments: A Patient Clinical Summary of Care was provided to EL Debrided necrotic tissue from right heel. 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 Off load and RX With Santyl to heel and aqua cell on rest of wounds Electronic Signature(s) Signed: 02/23/2016 10:40:43 AM By: [name] SaxParker, Zyere Jiminez MD Entered By: [name] SaxParker, Torben Soloway on 02/23/2016 10:40:43 [name] OttoLINDLEY, Delon V. ([id number]) -------------------------------------------------------------------------------- SuperBill Details Patient Name: [name] OttoLINDLEY, Eliaz V. Date of Service: 02/23/2016 Medical Record Number: [id number] Patient Account Number: [id number] Date of Birth/Sex: [birth year](70 y.o. 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 Facility Procedures CPT4 Code: [id number] Description: 11042 - DEB SUBQ TISSUE 20 SQ CM/< ICD-10 Description Diagnosis 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

## 2016-03-01 ENCOUNTER — Encounter: Payer: Medicare Other | Admitting: Surgery

## 2016-03-01 DIAGNOSIS — E11621 Type 2 diabetes mellitus with foot ulcer: Secondary | ICD-10-CM | POA: Diagnosis not present

## 2016-03-01 NOTE — Progress Notes (Addendum)
JAVI, BOLLMAN ([id number]) Visit Report for 03/01/2016 Chief Complaint Document Details Patient Name: [name] Date of Service: 03/01/2016 10:45 AM Medical Record Number: [id number] Patient Account Number: [id number] Date of Birth/Sex: 1946-06-04 (70 y.o. Male) Treating RN: Phillis Haggis Primary Care Physician: Oretha Milch Other Clinician: Referring Physician: Oretha Milch Treating Physician/Extender: Rudene Re in Treatment: 8 Information Obtained from: Patient Chief Complaint Patient is at the clinic for treatment of an open pressure ulcer the left upper thigh and gluteal region and the right heel with bilateral swelling of the legs all of it which is going on for over a year Electronic Signature(s) Signed: 03/01/2016 11:38:30 AM By: Evlyn Kanner MD, FACS Entered By: Evlyn Kanner on 03/01/2016 11:38:29 [name] ([id number]) -------------------------------------------------------------------------------- HPI Details Patient Name: [name] Date of Service: 03/01/2016 10:45 AM Medical Record Number: [id number] Patient Account Number: [id number] Date of Birth/Sex: Dec 05, 1945 (70 y.o. Male) Treating RN: Phillis Haggis Primary Care Physician: Oretha Milch Other Clinician: Referring Physician: Oretha Milch Treating Physician/Extender: Rudene Re in Treatment: 8 History of Present Illness Location: ulcerated area on the right heel, left gluteal region and thigh and then bilateral lower extremities Quality: Patient reports experiencing a sharp pain to affected area(s). Severity: Patient states wound are getting worse. Duration: Patient has had the wound for > 12 months prior to seeking treatment at the wound center Timing: Pain in wound is constant (hurts all the time) Context: The wound appeared gradually over time Modifying Factors: Other treatment(s) tried include: he sees his heart doctor and his primary care doctor Associated Signs and  Symptoms: patient has not been able to walk for over a year now HPI Description: 70 year old gentleman with a known history of hypertension, diabetes, obstructive sleep apnea, COPD, diastolic CHF, coronary artery disease was admitted to the hospital with sepsis from an ulcer of the right heel and was treated there in October 2016. he also has chronic bilateral lower extremity edema and lymphedema. He had received vancomycin, Zosyn and at that stage and x-rays showed hardware in the right ankle but no evidence of osteomyelitis. he was a former smoker. he was also treated with Augmentin orally for 10 days. He is either bedbound or wheelchair-bound and does not ambulate by himself. 01/19/2016 -- he has not been seen here for 3 weeks and this was because he was admitted to [facility] between 223 and 01/08/2016 for sepsis, UTI and pneumonia involving the left lung. he was treated with IV vancomycin and Zosyn and then meropenem. Was discharged home on oral Bactrim for 2 weeks none of his vascular test or x-rays were done and we will reorder these. 01/26/2016 -- he has not yet done the x-ray of his foot and is vascular tests are still pending. I have asked him to work on these with his nursing home staff. Addendum: he has got an x-ray of the right foot done which shows that his osteopenia but no specific ostial lysis or abnormal periosteal reaction. Final impression was degenerative changes with dorsal foot soft tissue swelling. 02/16/2016 -- lower extremity arterial duplex examination shows a 50-99% stenosis of the right tibioperoneal trunk. He had biphasic flow in the right SFA, popliteal and tibioperoneal trunk. Left-sided he had triphasic flow throughout 02/29/2016 -- he is awaiting his vascular opinion with Dr. Gilda Crease which is to be done on April 24 Electronic Signature(s) Signed: 03/01/2016 11:39:07 AM By: Evlyn Kanner MD, FACS Entered By: Evlyn Kanner on 03/01/2016  11:39:07 Cahn, Malachy V. (  [id number]) -------------------------------------------------------------------------------- Physical Exam Details Patient Name: [name], [name] Date of Service: 03/01/2016 10:45 AM Medical Record Number: [id number] Patient Account Number: [id number] Date of Birth/Sex: 1946/02/15 (69 y.o. Male) Treating RN: Phillis Haggis Primary Care Physician: Oretha Milch Other Clinician: Referring Physician: Oretha Milch Treating Physician/Extender: Rudene Re in Treatment: 8 Constitutional . Pulse regular. Respirations normal and unlabored. Afebrile. . Eyes Nonicteric. Reactive to light. Ears, Nose, Mouth, and Throat Lips, teeth, and gums WNL.Marland Kitchen Moist mucosa without lesions. Neck supple and nontender. No palpable supraclavicular or cervical adenopathy. Normal sized without goiter. Respiratory WNL. No retractions.. Breath sounds WNL, No rubs, rales, rhonchi, or wheeze.. Cardiovascular Heart rhythm and rate regular, no murmur or gallop.. Pedal Pulses WNL. No clubbing, cyanosis or edema. Chest Breasts symmetical and no nipple discharge.. Breast tissue WNL, no masses, lumps, or tenderness.. Lymphatic No adneopathy. No adenopathy. No adenopathy. Musculoskeletal Adexa without tenderness or enlargement.. Digits and nails w/o clubbing, cyanosis, infection, petechiae, ischemia, or inflammatory conditions.. Integumentary (Hair, Skin) No suspicious lesions. No crepitus or fluctuance. No peri-wound warmth or erythema. No masses.Marland Kitchen Psychiatric Judgement and insight Intact.. No evidence of depression, anxiety, or agitation.. Notes the wound on the left and right lower extremities have abated in the lymphedema has been much less. The right heel continues to have some subcutaneous debris and there is not much surrounding maceration and there is no evidence of cellulitis Electronic Signature(s) Signed: 03/01/2016 11:40:07 AM By: Evlyn Kanner MD, FACS Entered By: Evlyn Kanner on 03/01/2016 11:40:06 [name] ([id number]) -------------------------------------------------------------------------------- Physician Orders Details Patient Name: [name] Date of Service: 03/01/2016 10:45 AM Medical Record Number: [id number] Patient Account Number: [id number] Date of Birth/Sex: [birth year] (70 y.o. Male) Treating RN: Phillis Haggis Primary Care Physician: Oretha Milch Other Clinician: Referring Physician: Oretha Milch Treating Physician/Extender: Rudene Re in Treatment: 8 Verbal / Phone Orders: Yes Clinician: Ashok Cordia, Debi Read Back and Verified: Yes Diagnosis Coding Wound Cleansing Wound #1 Left Lower Leg o May shower with protection. - please cover wraps and keep dry o No tub bath. Wound #2 Right Lower Leg o May shower with protection. - please cover wraps and keep dry o No tub bath. Wound #3 Right Calcaneus o May shower with protection. - please cover wraps and keep dry o No tub bath. Wound #6 Right,Lateral Foot o May shower with protection. - please cover wraps and keep dry o No tub bath. Wound #7 Midline Coccyx o May shower with protection. - please cover wraps and keep dry o No tub bath. Anesthetic Wound #1 Left Lower Leg o Topical Lidocaine 4% cream applied to wound bed prior to debridement Wound #2 Right Lower Leg o Topical Lidocaine 4% cream applied to wound bed prior to debridement Wound #3 Right Calcaneus o Topical Lidocaine 4% cream applied to wound bed prior to debridement Wound #6 Right,Lateral Foot o Topical Lidocaine 4% cream applied to wound bed prior to debridement Wound #7 Midline Coccyx o Topical Lidocaine 4% cream applied to wound bed prior to debridement Yearsley, Ivo V. ([id number]) Skin Barriers/Peri-Wound Care Wound #3 Right Calcaneus o Barrier cream Wound #7 Midline Coccyx o Barrier cream Primary Wound Dressing Wound #1 Left Lower Leg o Aquacel Ag -  ******IF THE PT MISSES HIS APPT FOR THE WEEK NURSING AT THE FACILITY NEEDS TO CHANGE WRAPS AND DRESSINGS ON LEGS AND FOOT***** Wound #2 Right Lower Leg o Aquacel Ag - ******IF THE PT MISSES HIS APPT FOR THE WEEK NURSING AT THE FACILITY NEEDS TO CHANGE WRAPS  AND DRESSINGS ON LEGS AND FOOT***** Wound #3 Right Calcaneus o Santyl Ointment Wound #6 Right,Lateral Foot o Aquacel Ag - ******IF THE PT MISSES HIS APPT FOR THE WEEK NURSING AT THE FACILITY NEEDS TO CHANGE WRAPS AND DRESSINGS ON LEGS AND FOOT***** Wound #7 Midline Coccyx o Aquacel Ag Secondary Dressing Wound #1 Left Lower Leg o ABD pad - ******IF THE PT MISSES HIS APPT FOR THE WEEK NURSING AT THE FACILITY NEEDS TO CHANGE WRAPS AND DRESSINGS ON LEGS AND FOOT***** Wound #2 Right Lower Leg o ABD pad - ******IF THE PT MISSES HIS APPT FOR THE WEEK NURSING AT THE FACILITY NEEDS TO CHANGE WRAPS AND DRESSINGS ON LEGS AND FOOT***** Wound #3 Right Calcaneus o ABD pad Wound #6 Right,Lateral Foot o ABD pad - ******IF THE PT MISSES HIS APPT FOR THE WEEK NURSING AT THE FACILITY NEEDS TO CHANGE WRAPS AND DRESSINGS ON LEGS AND FOOT***** Wound #7 Midline Coccyx o Boardered Foam Dressing Dressing Change Frequency Wound #1 Left Lower Leg Behe, Andra V. ([id number]) o Change dressing every week - in clinic ******IF THE PT MISSES HIS APPT FOR THE WEEK NURSING AT THE FACILITY NEEDS TO CHANGE WRAPS AND DRESSINGS ON LEGS AND FOOT***** Wound #2 Right Lower Leg o Change dressing every week - in clinic ******IF THE PT MISSES HIS APPT FOR THE WEEK NURSING AT THE FACILITY NEEDS TO CHANGE WRAPS AND DRESSINGS ON LEGS AND FOOT***** Wound #3 Right Calcaneus o Change dressing every day. - to be changed daily by SNF RN Wound #6 Right,Lateral Foot o Change dressing every week - in clinic ******IF THE PT MISSES HIS APPT FOR THE WEEK NURSING AT THE FACILITY NEEDS TO CHANGE WRAPS AND DRESSINGS ON LEGS AND FOOT***** Wound #7 Midline  Coccyx o Change dressing every day. - to be changed daily by SNF RN Follow-up Appointments Wound #1 Left Lower Leg o Return Appointment in 1 week. - ******PLEASE MAKE SURE PATIENT COMES TO HIS WEEKLY APPTS, HE HAS TO HAVE HIS WRAPS CHANGED.****** ******IF THE PT MISSES HIS APPT FOR THE WEEK NURSING AT THE FACILITY NEEDS TO CHANGE WRAPS AND DRESSINGS ON LEGS AND FOOT***** Wound #2 Right Lower Leg o Return Appointment in 1 week. - ******PLEASE MAKE SURE PATIENT COMES TO HIS WEEKLY APPTS, HE HAS TO HAVE HIS WRAPS CHANGED.****** ******IF THE PT MISSES HIS APPT FOR THE WEEK NURSING AT THE FACILITY NEEDS TO CHANGE WRAPS AND DRESSINGS ON LEGS AND FOOT***** Wound #3 Right Calcaneus o Return Appointment in 1 week. - ******PLEASE MAKE SURE PATIENT COMES TO HIS WEEKLY APPTS, HE HAS TO HAVE HIS WRAPS CHANGED.****** ******IF THE PT MISSES HIS APPT FOR THE WEEK NURSING AT THE FACILITY NEEDS TO CHANGE WRAPS AND DRESSINGS ON LEGS AND FOOT***** Wound #6 Right,Lateral Foot o Return Appointment in 1 week. - ******PLEASE MAKE SURE PATIENT COMES TO HIS WEEKLY APPTS, HE HAS TO HAVE HIS WRAPS CHANGED.****** ******IF THE PT MISSES HIS APPT FOR THE WEEK NURSING AT THE FACILITY NEEDS TO CHANGE WRAPS AND DRESSINGS ON LEGS AND FOOT***** Wound #7 Midline Coccyx o Return Appointment in 1 week. - ******PLEASE MAKE SURE PATIENT COMES TO HIS WEEKLY APPTS, HE HAS TO HAVE HIS WRAPS CHANGED.****** ******IF THE PT MISSES HIS APPT FOR THE WEEK NURSING AT THE FACILITY NEEDS TO CHANGE WRAPS AND DRESSINGS ON LEGS AND FOOT***** Shoaff, Davidson V. ([id number]) Edema Control Wound #1 Left Lower Leg o 3 Layer Compression System - Bilateral - unna to achor ******IF THE PT MISSES HIS APPT FOR THE WEEK NURSING AT THE  FACILITY NEEDS TO CHANGE WRAPS AND DRESSINGS ON LEGS AND FOOT***** o Elevate legs to the level of the heart and pump ankles as often as possible - ******IF THE PT MISSES HIS APPT FOR THE WEEK NURSING  AT THE FACILITY NEEDS TO CHANGE WRAPS AND DRESSINGS ON LEGS AND FOOT***** Wound #2 Right Lower Leg o 3 Layer Compression System - Bilateral - unna to achor ******IF THE PT MISSES HIS APPT FOR THE WEEK NURSING AT THE FACILITY NEEDS TO CHANGE WRAPS AND DRESSINGS ON LEGS AND FOOT***** o Elevate legs to the level of the heart and pump ankles as often as possible - ******IF THE PT MISSES HIS APPT FOR THE WEEK NURSING AT THE FACILITY NEEDS TO CHANGE WRAPS AND DRESSINGS ON LEGS AND FOOT***** Wound #3 Right Calcaneus o 3 Layer Compression System - Bilateral - unna to achor ******IF THE PT MISSES HIS APPT FOR THE WEEK NURSING AT THE FACILITY NEEDS TO CHANGE WRAPS AND DRESSINGS ON LEGS AND FOOT***** o Elevate legs to the level of the heart and pump ankles as often as possible - ******IF THE PT MISSES HIS APPT FOR THE WEEK NURSING AT THE FACILITY NEEDS TO CHANGE WRAPS AND DRESSINGS ON LEGS AND FOOT***** Wound #6 Right,Lateral Foot o 3 Layer Compression System - Bilateral - unna to achor ******IF THE PT MISSES HIS APPT FOR THE WEEK NURSING AT THE FACILITY NEEDS TO CHANGE WRAPS AND DRESSINGS ON LEGS AND FOOT***** o Elevate legs to the level of the heart and pump ankles as often as possible - ******IF THE PT MISSES HIS APPT FOR THE WEEK NURSING AT THE FACILITY NEEDS TO CHANGE WRAPS AND DRESSINGS ON LEGS AND FOOT***** Off-Loading Wound #1 Left Lower Leg o Turn and reposition every 2 hours o Other: - sage boots Wound #2 Right Lower Leg o Turn and reposition every 2 hours o Other: - sage boots Wound #3 Right Calcaneus o Turn and reposition every 2 hours o Other: - sage boots Wound #6 Right,Lateral Foot o Turn and reposition every 2 hours Schatz, Mason V. ([id number]) o Other: - sage boots Wound #7 Midline Coccyx o Turn and reposition every 2 hours o Other: - sage boots Electronic Signature(s) Signed: 03/01/2016 4:56:07 PM By: Evlyn KannerBritto, Reyanne Hussar MD, FACS Signed:  03/01/2016 5:23:57 PM By: Alejandro MullingPinkerton, Debra Entered By: Alejandro MullingPinkerton, Debra on 03/01/2016 11:46:19 Ruark, Contrell Seth BakeV. ([id number]) -------------------------------------------------------------------------------- Problem List Details Patient Name: [name] OttoLINDLEY, Shalik V. Date of Service: 03/01/2016 10:45 AM Medical Record Number: [id number] Patient Account Number: [id number] Date of Birth/Sex: 29-Jun-1946 [age] y.o. Male) Treating RN: Phillis HaggisPinkerton, Debi Primary Care Physician: Oretha MilchSMITH, [name] Other Clinician: Referring Physician: Oretha MilchSMITH, [name] Treating Physician/Extender: Rudene ReBritto, Doral Digangi Weeks in Treatment: 8 Active Problems ICD-10 Encounter Code Description Active Date Diagnosis E11.621 Type 2 diabetes mellitus with foot ulcer 01/01/2016 Yes L89.613 Pressure ulcer of right heel, stage 3 01/01/2016 Yes E66.01 Morbid (severe) obesity due to excess calories 01/01/2016 Yes I89.0 Lymphedema, not elsewhere classified 01/01/2016 Yes M70.871 Other soft tissue disorders related to use, overuse and 01/19/2016 Yes pressure, right ankle and foot 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 ([id number]) Clint GuyLINDLEY, Garold VMarland Kitchen. ([id number]) -------------------------------------------------------------------------------- Progress Note Details Patient Name: [name] OttoLINDLEY, Finn V. Date of Service: 03/01/2016 10:45 AM Medical Record  Number: [id number] Patient Account Number: [id number] Date of Birth/Sex: [birth year] (70 y.o. Male) Treating RN: Ashok Cordia, Debi Primary Care Physician: Oretha Milch Other Clinician: Referring Physician: Oretha Milch Treating Physician/Extender: Rudene Re in Treatment: 8 Subjective Chief Complaint Information obtained from Patient Patient is at the clinic for treatment of an open pressure ulcer the left upper thigh and gluteal region and the right heel with bilateral swelling of the legs all of it which is going on for over a year History of Present Illness (HPI) The following HPI elements were documented for the patient's wound: Location: ulcerated area on the right heel, left gluteal region and thigh and then bilateral lower extremities Quality: Patient reports experiencing a sharp pain to affected area(s). Severity: Patient states wound are getting worse. Duration: Patient has had the wound for > 12 months prior to seeking treatment at the wound center Timing: Pain in wound is constant (hurts all the time) Context: The wound appeared gradually over time Modifying Factors: Other treatment(s) tried include: he sees his heart doctor and his primary care doctor Associated Signs and Symptoms: patient has not been able to walk for over a year now 70 year old gentleman with a known history of hypertension, diabetes, obstructive sleep apnea, COPD, diastolic CHF, coronary artery disease was admitted to the hospital with sepsis from an ulcer of the right heel and was treated there in October 2016. he also has chronic bilateral lower extremity edema and lymphedema. He had received vancomycin, Zosyn and at that stage and x-rays showed hardware in the right ankle but no evidence of osteomyelitis. he was a former smoker. he was also treated with Augmentin orally for 10 days. He is either bedbound or wheelchair-bound and does not ambulate by himself. 01/19/2016 -- he has not been seen here for 3 weeks and this was because he was admitted to [facility] between 223 and 01/08/2016 for sepsis, UTI and pneumonia involving the left lung. he was treated with IV vancomycin and Zosyn and then meropenem. Was discharged home on  oral Bactrim for 2 weeks none of his vascular test or x-rays were done and we will reorder these. 01/26/2016 -- he has not yet done the x-ray of his foot and is vascular tests are still pending. I have asked him to work on these with his nursing home staff. Addendum: he has got an x-ray of the right foot done which shows that his osteopenia but no specific ostial lysis or abnormal periosteal reaction. Final impression was degenerative changes with dorsal foot soft tissue swelling. 02/16/2016 -- lower extremity arterial duplex examination shows a 50-99% stenosis of the right tibioperoneal trunk. He had biphasic flow in the right SFA, popliteal and tibioperoneal trunk. Left-sided he had triphasic flow throughout MEDARD, DECUIR ([id number]) 02/29/2016 -- he is awaiting his vascular opinion with Dr. Gilda Crease which is to be done on April 24 Objective Constitutional Pulse regular. Respirations normal and unlabored. Afebrile. Vitals Time Taken: 10:56 AM, Height: 70 in, Weight: 235 lbs, BMI: 33.7, Pulse: 61 bpm, Respiratory Rate: 18 breaths/min, Blood Pressure: 113/57 mmHg. Eyes Nonicteric. Reactive to light. Ears, Nose, Mouth, and Throat Lips, teeth, and gums WNL.Marland Kitchen Moist mucosa without lesions. Neck supple and nontender. No palpable supraclavicular or cervical adenopathy. Normal sized without goiter. Respiratory WNL. No retractions.. Breath sounds WNL, No rubs, rales, rhonchi, or wheeze.. Cardiovascular Heart rhythm and rate regular, no murmur or gallop.. Pedal Pulses WNL. No clubbing, cyanosis or edema. Chest Breasts symmetical and no nipple discharge.. Breast tissue  WNL, no masses, lumps, or tenderness.. Lymphatic No adneopathy. No adenopathy. No adenopathy. Musculoskeletal Adexa without tenderness or enlargement.. Digits and nails w/o clubbing, cyanosis, infection, petechiae, ischemia, or inflammatory conditions.Marland Kitchen Psychiatric Judgement and insight Intact.. No evidence of depression,  anxiety, or agitation.. General Notes: the wound on the left and right lower extremities have abated in the lymphedema has been much less. The right heel continues to have some subcutaneous debris and there is not much surrounding maceration and there is no evidence of cellulitis Integumentary (Hair, Skin) Nordin, Jaeceon V. ([id number]) No suspicious lesions. No crepitus or fluctuance. No peri-wound warmth or erythema. No masses.. Wound #1 status is Open. Original cause of wound was Gradually Appeared. The wound is located on the Left Lower Leg. The wound measures 6cm length x 6cm width x 0.1cm depth; 28.274cm^2 area and 2.827cm^3 volume. The wound is limited to skin breakdown. There is no tunneling or undermining noted. There is a large amount of serosanguineous drainage noted. The wound margin is flat and intact. There is medium (34-66%) pink granulation within the wound bed. There is a medium (34-66%) amount of necrotic tissue within the wound bed including Adherent Slough. The periwound skin appearance exhibited: Localized Edema, Moist, Erythema. The surrounding wound skin color is noted with erythema which is circumferential. Periwound temperature was noted as No Abnormality. The periwound has tenderness on palpation. Wound #2 status is Open. Original cause of wound was Gradually Appeared. The wound is located on the Right Lower Leg. The wound measures 3cm length x 2cm width x 0.1cm depth; 4.712cm^2 area and 0.471cm^3 volume. The wound is limited to skin breakdown. There is no tunneling or undermining noted. There is a none present amount of drainage noted. The wound margin is flat and intact. There is no granulation within the wound bed. There is a large (67-100%) amount of necrotic tissue within the wound bed including Eschar. The periwound skin appearance exhibited: Erythema. The periwound skin appearance did not exhibit: Localized Edema, Moist. The surrounding wound skin color is noted  with erythema which is circumferential. Periwound temperature was noted as No Abnormality. The periwound has tenderness on palpation. Wound #3 status is Open. Original cause of wound was Pressure Injury. The wound is located on the Right Calcaneus. The wound measures 2.9cm length x 3.3cm width x 0.2cm depth; 7.516cm^2 area and 1.503cm^3 volume. The wound is limited to skin breakdown. There is no tunneling or undermining noted. There is a large amount of serous drainage noted. The wound margin is flat and intact. There is small (1-33%) red, pink granulation within the wound bed. There is a large (67-100%) amount of necrotic tissue within the wound bed including Eschar and Adherent Slough. The periwound skin appearance exhibited: Localized Edema, Maceration, Moist, Erythema. The surrounding wound skin color is noted with erythema which is circumferential. Periwound temperature was noted as No Abnormality. The periwound has tenderness on palpation. Wound #6 status is Open. Original cause of wound was Pressure Injury. The wound is located on the Right,Lateral Foot. The wound measures 0.5cm length x 0.4cm width x 0.1cm depth; 0.157cm^2 area and 0.016cm^3 volume. The wound is limited to skin breakdown. There is no tunneling or undermining noted. There is a large amount of serosanguineous drainage noted. The wound margin is flat and intact. There is no granulation within the wound bed. There is a large (67-100%) amount of necrotic tissue within the wound bed including Eschar and Adherent Slough. The periwound skin appearance exhibited: Moist. Periwound temperature was noted as  No Abnormality. The periwound has tenderness on palpation. Wound #7 status is Open. Original cause of wound was Pressure Injury. The wound is located on the Midline Coccyx. The wound measures 4cm length x 3cm width x 0.1cm depth; 9.425cm^2 area and 0.942cm^3 volume. The wound is limited to skin breakdown. There is no tunneling or  undermining noted. There is a large amount of serosanguineous drainage noted. The wound margin is flat and intact. There is large (67-100%) red granulation within the wound bed. There is a small (1-33%) amount of necrotic tissue within the wound bed including Eschar and Adherent Slough. The periwound skin appearance exhibited: Moist, Erythema. The surrounding wound skin color is noted with erythema which is circumferential. Periwound temperature was noted as No Abnormality. The periwound has tenderness on palpation. 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 Santyl ointment locally to his right heel.the patient is not able to tolerate any debridement today as he is very tender 2. Constant off loading and also using Sage boot. 3. Bilateral arterial duplex studies of both lower extremities -- report reviewed with the patient and I have recommended a vascular consult which is pending with Dr. Gilda Crease on April 24 4. bilateral lower extremity Profore lites which he has been using before 5. High-protein diet and multivitamins including vitamin C and zinc. Plan Wound Cleansing: Wound #1 Left Lower Leg: May shower with protection. - please cover wraps and keep dry No tub bath. Wound #2 Right Lower Leg: May shower with protection. - please cover wraps and keep dry No tub bath. Wound #3 Right Calcaneus: May shower with protection. - please cover wraps and keep dry No tub bath. Wound #6 Right,Lateral Foot: May shower with  protection. - please cover wraps and keep dry No tub bath. Wound #7 Midline Coccyx: May shower with protection. - please cover wraps and keep dry No tub bath. Anesthetic: Wound #1 Left Lower Leg: Topical Lidocaine 4% cream applied to wound bed prior to debridement Bagsby, Dann V. ([id number]) Wound #2 Right Lower Leg: Topical Lidocaine 4% cream applied to wound bed prior to debridement Wound #3 Right Calcaneus: Topical Lidocaine 4% cream applied to wound bed prior to debridement Wound #6 Right,Lateral Foot: Topical Lidocaine 4% cream applied to wound bed prior to debridement Wound #7 Midline Coccyx: Topical Lidocaine 4% cream applied to wound bed prior to debridement Skin Barriers/Peri-Wound Care: Wound #3 Right Calcaneus: Barrier cream Wound #7 Midline Coccyx: Barrier cream Primary Wound Dressing: Wound #1 Left Lower Leg: Aquacel Ag - ******IF THE PT MISSES HIS APPT FOR THE WEEK NURSING AT THE FACILITY NEEDS TO CHANGE WRAPS AND DRESSINGS ON LEGS AND FOOT***** Wound #2 Right Lower Leg: Aquacel Ag - ******IF THE PT MISSES HIS APPT FOR THE WEEK NURSING AT THE FACILITY NEEDS TO CHANGE WRAPS AND DRESSINGS ON LEGS AND FOOT***** Wound #3 Right Calcaneus: Santyl Ointment Wound #6 Right,Lateral Foot: Aquacel Ag - ******IF THE PT MISSES HIS APPT FOR THE WEEK NURSING AT THE FACILITY NEEDS TO CHANGE WRAPS AND DRESSINGS ON LEGS AND FOOT***** Wound #7 Midline Coccyx: Aquacel Ag Secondary  Dressing: Wound #1 Left Lower Leg: ABD pad - ******IF THE PT MISSES HIS APPT FOR THE WEEK NURSING AT THE FACILITY NEEDS TO CHANGE WRAPS AND DRESSINGS ON LEGS AND FOOT***** Wound #2 Right Lower Leg: ABD pad - ******IF THE PT MISSES HIS APPT FOR THE WEEK NURSING AT THE FACILITY NEEDS TO CHANGE WRAPS AND DRESSINGS ON LEGS AND FOOT***** Wound #3 Right Calcaneus: ABD pad Wound #6 Right,Lateral Foot: ABD pad - ******IF THE PT MISSES HIS APPT FOR THE WEEK NURSING AT THE FACILITY NEEDS TO CHANGE WRAPS AND  DRESSINGS ON LEGS AND FOOT***** Wound #7 Midline Coccyx: Boardered Foam Dressing Dressing Change Frequency: Wound #1 Left Lower Leg: Change dressing every week - in clinic ******IF THE PT MISSES HIS APPT FOR THE WEEK NURSING AT THE FACILITY NEEDS TO CHANGE WRAPS AND DRESSINGS ON LEGS AND FOOT***** Wound #2 Right Lower Leg: Change dressing every week - in clinic ******IF THE PT MISSES HIS APPT FOR THE WEEK NURSING AT THE FACILITY NEEDS TO CHANGE WRAPS AND DRESSINGS ON LEGS AND FOOT***** Wound #3 Right Calcaneus: Change dressing every day. - to be changed daily by SNF RN Wound #6 Right,Lateral Foot: Simms, Shyam V. ([id number]) Change dressing every week - in clinic ******IF THE PT MISSES HIS APPT FOR THE WEEK NURSING AT THE FACILITY NEEDS TO CHANGE WRAPS AND DRESSINGS ON LEGS AND FOOT***** Wound #7 Midline Coccyx: Change dressing every day. - to be changed daily by SNF RN Follow-up Appointments: Wound #1 Left Lower Leg: Return Appointment in 1 week. - ******PLEASE MAKE SURE PATIENT COMES TO HIS WEEKLY APPTS, HE HAS TO HAVE HIS WRAPS CHANGED.****** ******IF THE PT MISSES HIS APPT FOR THE WEEK NURSING AT THE FACILITY NEEDS TO CHANGE WRAPS AND DRESSINGS ON LEGS AND FOOT***** Wound #2 Right Lower Leg: Return Appointment in 1 week. - ******PLEASE MAKE SURE PATIENT COMES TO HIS WEEKLY APPTS, HE HAS TO HAVE HIS WRAPS CHANGED.****** ******IF THE PT MISSES HIS APPT FOR THE WEEK NURSING AT THE FACILITY NEEDS TO CHANGE WRAPS AND DRESSINGS ON LEGS AND FOOT***** Wound #3 Right Calcaneus: Return Appointment in 1 week. - ******PLEASE MAKE SURE PATIENT COMES TO HIS WEEKLY APPTS, HE HAS TO HAVE HIS WRAPS CHANGED.****** ******IF THE PT MISSES HIS APPT FOR THE WEEK NURSING AT THE FACILITY NEEDS TO CHANGE WRAPS AND DRESSINGS ON LEGS AND FOOT***** Wound #6 Right,Lateral Foot: Return Appointment in 1 week. - ******PLEASE MAKE SURE PATIENT COMES TO HIS WEEKLY APPTS, HE HAS TO HAVE HIS WRAPS  CHANGED.****** ******IF THE PT MISSES HIS APPT FOR THE WEEK NURSING AT THE FACILITY NEEDS TO CHANGE WRAPS AND DRESSINGS ON LEGS AND FOOT***** Wound #7 Midline Coccyx: Return Appointment in 1 week. - ******PLEASE MAKE SURE PATIENT COMES TO HIS WEEKLY APPTS, HE HAS TO HAVE HIS WRAPS CHANGED.****** ******IF THE PT MISSES HIS APPT FOR THE WEEK NURSING AT THE FACILITY NEEDS TO CHANGE WRAPS AND DRESSINGS ON LEGS AND FOOT***** Edema Control: Wound #1 Left Lower Leg: 3 Layer Compression System - Bilateral - unna to achor ******IF THE PT MISSES HIS APPT FOR THE WEEK NURSING AT THE FACILITY NEEDS TO CHANGE WRAPS AND DRESSINGS ON LEGS AND FOOT***** Elevate legs to the level of the heart and pump ankles as often as possible - ******IF THE PT MISSES HIS APPT FOR THE WEEK NURSING AT THE FACILITY NEEDS TO CHANGE WRAPS AND DRESSINGS ON LEGS AND FOOT***** Wound #2 Right Lower Leg: 3 Layer Compression System - Bilateral - unna to achor ******IF THE PT MISSES HIS APPT  FOR THE WEEK NURSING AT THE FACILITY NEEDS TO CHANGE WRAPS AND DRESSINGS ON LEGS AND FOOT***** Elevate legs to the level of the heart and pump ankles as often as possible - ******IF THE PT MISSES HIS APPT FOR THE WEEK NURSING AT THE FACILITY NEEDS TO CHANGE WRAPS AND DRESSINGS ON LEGS AND FOOT***** Wound #3 Right Calcaneus: 3 Layer Compression System - Bilateral - unna to achor ******IF THE PT MISSES HIS APPT FOR THE WEEK NURSING AT THE FACILITY NEEDS TO CHANGE WRAPS AND DRESSINGS ON LEGS AND FOOT***** Elevate legs to the level of the heart and pump ankles as often as possible - ******IF THE PT MISSES HIS APPT FOR THE WEEK NURSING AT THE FACILITY NEEDS TO CHANGE WRAPS AND DRESSINGS Maisel, Caprice V. ([id number]) ON LEGS AND FOOT***** Wound #6 Right,Lateral Foot: 3 Layer Compression System - Bilateral - unna to achor ******IF THE PT MISSES HIS APPT FOR THE WEEK NURSING AT THE FACILITY NEEDS TO CHANGE WRAPS AND DRESSINGS ON LEGS  AND FOOT***** Elevate legs to the level of the heart and pump ankles as often as possible - ******IF THE PT MISSES HIS APPT FOR THE WEEK NURSING AT THE FACILITY NEEDS TO CHANGE WRAPS AND DRESSINGS ON LEGS AND FOOT***** Off-Loading: Wound #1 Left Lower Leg: Turn and reposition every 2 hours Other: - sage boots Wound #2 Right Lower Leg: Turn and reposition every 2 hours Other: - sage boots Wound #3 Right Calcaneus: Turn and reposition every 2 hours Other: - sage boots Wound #6 Right,Lateral Foot: Turn and reposition every 2 hours Other: - sage boots Wound #7 Midline Coccyx: Turn and reposition every 2 hours Other: - sage boots After reviewing his vascular reports, I have recommended: 1. Santyl ointment locally to his right heel.the patient is not able to tolerate any debridement today as he is very tender 2. Constant off loading and also using Sage boot. 3. Bilateral arterial duplex studies of both lower extremities -- report reviewed with the patient and I have recommended a vascular consult which is pending with Dr. Gilda Crease on April 24 4. bilateral lower extremity Profore lites which he has been using before 5. High-protein diet and multivitamins including vitamin C and zinc. Electronic Signature(s) Signed: 03/01/2016 4:58:44 PM By: Evlyn Kanner MD, FACS Previous Signature: 03/01/2016 4:58:34 PM Version By: Evlyn Kanner MD, FACS Previous Signature: 03/01/2016 11:41:33 AM Version By: Evlyn Kanner MD, FACS Entered By: Evlyn Kanner on 03/01/2016 16:58:44 [name] ([id number]) -------------------------------------------------------------------------------- SuperBill Details Patient Name: [name] Date of Service: 03/01/2016 Medical Record Number: [id number] Patient Account Number: [id number] Date of Birth/Sex: [birth year] (70 y.o. 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 Physician Procedures CPT4 Code: [id number] Description: 99213 - WC PHYS LEVEL 3 - EST PT ICD-10 Description Diagnosis E11.621 Type 2 diabetes mellitus with foot ulcer L89.613 Pressure ulcer of right heel, stage 3 E66.01 Morbid (severe) obesity due  to excess calories I89.0 Lymphedema, not  elsewhere classified Modifier: Quantity: 1 Electronic Signature(s) Signed: 03/01/2016 4:56:07 PM By: Evlyn Kanner MD, FACS Signed: 03/01/2016 5:23:57 PM By: Alejandro Mulling Previous Signature: 03/01/2016 11:42:10 AM Version By: Evlyn Kanner MD, FACS Entered By: Alejandro Mulling on 03/01/2016 13:15:59

## 2016-03-02 NOTE — Progress Notes (Signed)
BRYNDAN, BILYK ([id number]) Visit Report for 03/01/2016 Arrival Information Details Patient Name: [name], [name] Date of Service: 03/01/2016 10:45 AM Medical Record Number: [id number] Patient Account Number: [id number] Date of Birth/Sex: [birth year] (70 y.o. Male) Treating RN: Phillis Haggis Primary Care Physician: Oretha Milch Other Clinician: Referring Physician: Oretha Milch Treating Physician/Extender: Rudene Re in Treatment: 8 Visit Information History Since Last Visit Added or deleted any medications: No Patient Arrived: Wheel Chair Any new allergies or adverse reactions: No Arrival Time: 10:56 Had a fall or experienced change in No activities of daily living that may affect Accompanied By: self risk of falls: Transfer Assistance: None Signs or symptoms of abuse/neglect since last No Patient Identification Verified: Yes visito Secondary Verification Process Yes Hospitalized since last visit: No Completed: Pain Present Now: No Patient Requires Transmission-Based No Precautions: Patient Has Alerts: Yes Patient Alerts: DM II Electronic Signature(s) Signed: 03/01/2016 5:23:57 PM By: Alejandro Mulling Entered By: Alejandro Mulling on 03/01/2016 13:17:36 [name] ([id number]) -------------------------------------------------------------------------------- Encounter Discharge Information Details Patient Name: [name] Date of Service: 03/01/2016 10:45 AM Medical Record Number: [id number] Patient Account Number: [id number] Date of Birth/Sex: [birth year] (70 y.o. Male) Treating RN: Phillis Haggis Primary Care Physician: Oretha Milch Other Clinician: Referring Physician: Oretha Milch Treating Physician/Extender: Rudene Re in Treatment: 8 Encounter Discharge Information Items Discharge Pain Level: 0 Discharge Condition: Stable Ambulatory Status: Wheelchair Discharge Destination: Nursing Home Transportation: Other Accompanied By:  self Schedule Follow-up Appointment: Yes Medication Reconciliation completed and provided to Patient/Care Yes [name]: Provided on Clinical Summary of Care: 03/01/2016 Form Type Recipient Paper Patient EL Electronic Signature(s) Signed: 03/01/2016 5:23:57 PM By: Alejandro Mulling Previous Signature: 03/01/2016 11:56:08 AM Version By: Gwenlyn Perking Entered By: Alejandro Mulling on 03/01/2016 16:20:12 [name] ([id number]) -------------------------------------------------------------------------------- Lower Extremity Assessment Details Patient Name: [name] Date of Service: 03/01/2016 10:45 AM Medical Record Number: [id number] Patient Account Number: [id number] Date of Birth/Sex: 1946-04-10 (70 y.o. Male) Treating RN: Phillis Haggis Primary Care Physician: Oretha Milch Other Clinician: Referring Physician: Oretha Milch Treating Physician/Extender: Rudene Re in Treatment: 8 Edema Assessment Assessed: [Left: No] [Right: No] E[Left: dema] [Right: :] Calf Left: Right: Point of Measurement: cm From Medial Instep 34.2 cm 33.2 cm Ankle Left: Right: Point of Measurement: cm From Medial Instep 25.6 cm 24.4 cm Vascular Assessment Pulses: Posterior Tibial Dorsalis Pedis Palpable: [Left:Yes] [Right:Yes] Extremity colors, hair growth, and conditions: Extremity Color: [Left:Hyperpigmented] [Right:Hyperpigmented] Temperature of Extremity: [Left:Warm] [Right:Warm] Capillary Refill: [Left:> 3 seconds] [Right:> 3 seconds] Toe Nail Assessment Left: Right: Thick: Yes Yes Discolored: Yes Yes Deformed: Yes Yes Improper Length and Hygiene: Yes Yes Electronic Signature(s) Signed: 03/01/2016 5:23:57 PM By: Alejandro Mulling Entered By: Alejandro Mulling on 03/01/2016 11:18:15 [name] ([id number]) -------------------------------------------------------------------------------- Multi Wound Chart Details Patient Name: [name] Date of Service: 03/01/2016  10:45 AM Medical Record Number: [id number] Patient Account Number: [id number] Date of Birth/Sex: 08-19-1946 (70 y.o. Male) Treating RN: Phillis Haggis Primary Care Physician: Oretha Milch Other Clinician: Referring Physician: Oretha Milch Treating Physician/Extender: Rudene Re in Treatment: 8 Vital Signs Height(in): 70 Pulse(bpm): 61 Weight(lbs): 235 Blood Pressure 113/57 (mmHg): Body Mass Index(BMI): 34 Temperature(F): Respiratory Rate 18 (breaths/min): Photos: [1:No Photos] [2:No Photos] [3:No Photos] Wound Location: [1:Left Lower Leg] [2:Right Lower Leg] [3:Right Calcaneus] Wounding Event: [1:Gradually Appeared] [2:Gradually Appeared] [3:Pressure Injury] Primary Etiology: [1:Venous Leg Ulcer] [2:Venous Leg Ulcer] [3:Pressure Ulcer] Comorbid History: [1:Cataracts, Chronic Obstructive Pulmonary Disease (COPD), Sleep Disease (COPD), Sleep Disease (COPD), Sleep Apnea, Angina, Congestive Heart Failure,  Congestive Heart Failure, Congestive Heart Failure, Hypertension, Type II Diabetes,  Gout, Osteoarthritis, Neuropathy Osteoarthritis, Neuropathy Osteoarthritis, Neuropathy] [2:Cataracts, Chronic Obstructive Pulmonary Apnea, Angina, Hypertension, Type II Diabetes, Gout,] [3:Cataracts, Chronic Obstructive Pulmonary Apnea, Angina,  Hypertension, Type II Diabetes, Gout,] Date Acquired: [1:12/31/2014] [2:12/31/2014] [3:10/31/2015] Weeks of Treatment: [1:8] [2:8] [3:8] Wound Status: [1:Open] [2:Open] [3:Open] Measurements L x W x D 6x6x0.1 [2:3x2x0.1] [3:2.9x3.3x0.2] (cm) Area (cm) : [1:28.274] [2:4.712] [3:7.516] Volume (cm) : [1:2.827] [2:0.471] [3:1.503] % Reduction in Area: [1:-15.40%] [2:-1896.60%] [3:-6.30%] % Reduction in Volume: -15.40% [2:-1862.50%] [3:-6.30%] Classification: [1:Partial Thickness] [2:Partial Thickness] [3:Category/Stage III] HBO Classification: [1:Grade 1] [2:Grade 1] [3:Grade 1] Exudate Amount: [1:Large] [2:None Present] [3:Large] Exudate Type:  [1:Serosanguineous] [2:N/A] [3:Serous] Exudate Color: [1:red, brown] [2:N/A] [3:amber] Wound Margin: [1:Flat and Intact] [2:Flat and Intact] [3:Flat and Intact] Granulation Amount: [1:Medium (34-66%)] [2:None Present (0%)] [3:Small (1-33%)] Granulation Quality: [1:Pink] [2:N/A] [3:Red, Pink] Necrotic Amount: [1:Medium (34-66%)] [2:Large (67-100%)] [3:Large (67-100%)] Necrotic Tissue: Adherent Slough Engineer, mining, Adherent Slough Exposed Structures: Fascia: No Fascia: No Fascia: No Fat: No Fat: No Fat: No Tendon: No Tendon: No Tendon: No Muscle: No Muscle: No Muscle: No Joint: No Joint: No Joint: No Bone: No Bone: No Bone: No Limited to Skin Limited to Skin Limited to Skin Breakdown Breakdown Breakdown Epithelialization: None None None Periwound Skin Texture: Edema: Yes Edema: No Edema: Yes Periwound Skin Moist: Yes Moist: No Maceration: Yes Moisture: Moist: Yes Periwound Skin Color: Erythema: Yes Erythema: Yes Erythema: Yes Erythema Location: Circumferential Circumferential Circumferential Temperature: No Abnormality No Abnormality No Abnormality Tenderness on Yes Yes Yes Palpation: Wound Preparation: Ulcer Cleansing: Other: Ulcer Cleansing: Other: Ulcer Cleansing: soap and water soap and water Rinsed/Irrigated with Saline Topical Anesthetic Applied: Other: lidocaine Topical Anesthetic 4% Applied: Other: lidocaine 4% Wound Number: 6 7 N/A Photos: No Photos No Photos N/A Wound Location: Right Foot - Lateral Coccyx - Midline N/A Wounding Event: Pressure Injury Pressure Injury N/A Primary Etiology: Pressure Ulcer Pressure Ulcer N/A Comorbid History: Cataracts, Chronic Cataracts, Chronic N/A Obstructive Pulmonary Obstructive Pulmonary Disease (COPD), Sleep Disease (COPD), Sleep Apnea, Angina, Apnea, Angina, Congestive Heart Failure, Congestive Heart Failure, Hypertension, Type II Hypertension, Type II Diabetes, Gout, Diabetes, Gout, Osteoarthritis, Neuropathy  Osteoarthritis, Neuropathy Date Acquired: 02/16/2016 02/23/2016 N/A Weeks of Treatment: 2 1 N/A Wound Status: Open Open N/A Measurements L x W x D 0.5x0.4x0.1 4x3x0.1 N/A (cm) Area (cm) : 0.157 9.425 N/A Volume (cm) : 0.016 0.942 N/A % Reduction in Area: 0.00% 60.30% N/A % Reduction in Volume: 0.00% 60.40% N/A Classification: Category/Stage II Category/Stage II N/A HBO Classification: Grade 1 N/A N/A [name], [name] ([id number]) Exudate Amount: Large Large N/A Exudate Type: Serosanguineous Serosanguineous N/A Exudate Color: red, brown red, brown N/A Wound Margin: Flat and Intact Flat and Intact N/A Granulation Amount: None Present (0%) Large (67-100%) N/A Granulation Quality: N/A Red N/A Necrotic Amount: Large (67-100%) Small (1-33%) N/A Necrotic Tissue: Eschar, Adherent Slough Eschar, Adherent Slough N/A Exposed Structures: Fascia: No Fascia: No N/A Fat: No Fat: No Tendon: No Tendon: No Muscle: No Muscle: No Joint: No Joint: No Bone: No Bone: No Limited to Skin Limited to Skin Breakdown Breakdown Epithelialization: None None N/A Periwound Skin Texture: No Abnormalities Noted No Abnormalities Noted N/A Periwound Skin Moist: Yes Moist: Yes N/A Moisture: Periwound Skin Color: No Abnormalities Noted Erythema: Yes N/A Erythema Location: N/A Circumferential N/A Temperature: No Abnormality No Abnormality N/A Tenderness on Yes Yes N/A Palpation: Wound Preparation: Ulcer Cleansing: Ulcer Cleansing: N/A Rinsed/Irrigated with Rinsed/Irrigated with Saline Saline Topical Anesthetic Topical Anesthetic  Applied: Other: lidocaine Applied: Other: lidocaine 4% 4% Treatment Notes Electronic Signature(s) Signed: 03/01/2016 5:23:57 PM By: Alejandro Mulling Entered By: Alejandro Mulling on 03/01/2016 13:31:04 [name] ([id number]) -------------------------------------------------------------------------------- Multi-Disciplinary Care Plan Details Patient Name: [name] Date of Service: 03/01/2016 10:45 AM Medical Record Number: [id number] Patient Account Number: [id number] Date of Birth/Sex: 10/27/1946 (70 y.o. Male) Treating RN: Phillis Haggis Primary Care Physician: Oretha Milch Other Clinician: Referring Physician: Oretha Milch Treating Physician/Extender: Rudene Re in Treatment: 8 Active Inactive Abuse / Safety / Falls / Self Care Management Nursing Diagnoses: Potential for falls Goals: Patient will remain injury free Date Initiated: 03/01/2016 Goal Status: Active Interventions: Assess fall risk on admission and as needed Notes: Nutrition Nursing Diagnoses: Imbalanced nutrition Goals: Patient/caregiver agrees to and verbalizes understanding of need to use nutritional supplements and/or vitamins as prescribed Date Initiated: 03/01/2016 Goal Status: Active Interventions: Assess patient nutrition upon admission and as needed per policy Notes: Orientation to the Wound Care Program Nursing Diagnoses: Knowledge deficit related to the wound healing center program Goals: Patient/caregiver will verbalize understanding of the [facility]aling [facility] [address] BRAEDYN, KAUK ([id number]) Date Initiated: 03/01/2016 Goal Status: Active Interventions: Provide education on orientation to the wound center Notes: Pain, Acute or Chronic Nursing Diagnoses: Pain, acute or chronic: actual or potential Potential alteration in comfort, pain Goals: Patient will verbalize adequate pain control and receive pain control interventions during procedures as needed Date Initiated: 03/01/2016 Goal Status: Active Patient/caregiver will verbalize adequate pain control between visits Date Initiated: 03/01/2016 Goal Status: Active Interventions: Assess comfort goal upon admission Complete pain assessment as per visit requirements Notes: Pressure Nursing Diagnoses: Knowledge deficit related to causes and risk factors for pressure ulcer  development Knowledge deficit related to management of pressures ulcers Goals: Patient/caregiver will verbalize risk factors for pressure ulcer development Date Initiated: 03/01/2016 Goal Status: Active Interventions: Assess offloading mechanisms upon admission and as needed Notes: Wound/Skin Impairment Nursing Diagnoses: [name], [name] ([id number]) Impaired tissue integrity Goals: Ulcer/skin breakdown will have a volume reduction of 30% by week 4 Date Initiated: 03/01/2016 Goal Status: Active Ulcer/skin breakdown will have a volume reduction of 50% by week 8 Date Initiated: 03/01/2016 Goal Status: Active Ulcer/skin breakdown will have a volume reduction of 80% by week 12 Date Initiated: 03/01/2016 Goal Status: Active Interventions: Assess ulceration(s) every visit Notes: Electronic Signature(s) Signed: 03/01/2016 5:23:57 PM By: Alejandro Mulling Entered By: Alejandro Mulling on 03/01/2016 13:19:11 [name], [name] ([id number]) -------------------------------------------------------------------------------- Pain Assessment Details Patient Name: [name] Date of Service: 03/01/2016 10:45 AM Medical Record Number: [id number] Patient Account Number: [id number] Date of Birth/Sex: 1946-01-21 (70 y.o. Male) Treating RN: Phillis Haggis Primary Care Physician: Oretha Milch Other Clinician: Referring Physician: Oretha Milch Treating Physician/Extender: Rudene Re in Treatment: 8 Active Problems Location of Pain Severity and Description of Pain Patient Has Paino No Site Locations Pain Management and Medication Current Pain Management: Electronic Signature(s) Signed: 03/01/2016 5:23:57 PM By: Alejandro Mulling Entered By: Alejandro Mulling on 03/01/2016 13:17:42 [name], [name] ([id number]) -------------------------------------------------------------------------------- Patient/Caregiver Education Details Patient Name: [name] Date of Service: 03/01/2016  10:45 AM Medical Record Number: [id number] Patient Account Number: [id number] Date of Birth/Gender: 10/26/1946 (70 y.o. Male) Treating RN: Phillis Haggis Primary Care Physician: Oretha Milch Other Clinician: Referring Physician: Oretha Milch Treating Physician/Extender: Rudene Re in Treatment: 8 Education Assessment Education Provided To: Patient Education Topics Provided Wound/Skin Impairment: Handouts: Other: change dressings as order and do not get wraps wet Methods: Demonstration, Explain/Verbal Responses: State content  correctly Electronic Signature(s) Signed: 03/01/2016 5:23:57 PM By: Alejandro MullingPinkerton, Debra Entered By: Alejandro MullingPinkerton, Debra on 03/01/2016 16:20:38 [name] OttoLINDLEY, [name] ([id number]) -------------------------------------------------------------------------------- Wound Assessment Details Patient Name: [name] OttoLINDLEY, [name] Date of Service: 03/01/2016 10:45 AM Medical Record Number: [id number] Patient Account Number: [id number] Date of Birth/Sex: 03-25-1946 38(70 y.o. Male) Treating RN: Phillis HaggisPinkerton, Debi Primary Care Physician: Oretha MilchSMITH, [name] Other Clinician: Referring Physician: Oretha MilchSMITH, [name] Treating Physician/Extender: Rudene ReBritto, Errol Weeks in Treatment: 8 Wound Status Wound Number: 1 Primary Venous Leg Ulcer Etiology: Wound Location: Left Lower Leg Wound Open Wounding Event: Gradually Appeared Status: Date Acquired: 12/31/2014 Comorbid Cataracts, Chronic Obstructive Weeks Of Treatment: 8 History: Pulmonary Disease (COPD), Sleep Clustered Wound: No Apnea, Angina, Congestive Heart Failure, Hypertension, Type II Diabetes, Gout, Osteoarthritis, Neuropathy Photos Photo Uploaded By: Alejandro MullingPinkerton, Debra on 03/01/2016 17:00:43 Wound Measurements Length: (cm) 6 Width: (cm) 6 Depth: (cm) 0.1 Area: (cm) 28.274 Volume: (cm) 2.827 % Reduction in Area: -15.4% % Reduction in Volume: -15.4% Epithelialization: None Tunneling: No Undermining: No Wound  Description Classification: Partial Thickness Foul Odor A Diabetic Severity (Wagner): Grade 1 Wound Margin: Flat and Intact Exudate Amount: Large Exudate Type: Serosanguineous Exudate Color: red, brown fter Cleansing: No Wound Bed Granulation Amount: Medium (34-66%) Exposed Structure Conard, Arnez V. ([id number]) Granulation Quality: Pink Fascia Exposed: No Necrotic Amount: Medium (34-66%) Fat Layer Exposed: No Necrotic Quality: Adherent Slough Tendon Exposed: No Muscle Exposed: No Joint Exposed: No Bone Exposed: No Limited to Skin Breakdown Periwound Skin Texture Texture Color No Abnormalities Noted: No No Abnormalities Noted: No Localized Edema: Yes Erythema: Yes Erythema Location: Circumferential Moisture No Abnormalities Noted: No Temperature / Pain Moist: Yes Temperature: No Abnormality Tenderness on Palpation: Yes Wound Preparation Ulcer Cleansing: Other: soap and water, Topical Anesthetic Applied: Other: lidocaine 4%, Treatment Notes Wound #1 (Left Lower Leg) 1. Cleansed with: Cleanse wound with antibacterial soap and water 3. Peri-wound Care: Barrier cream 4. Dressing Applied: Aquacel Ag 5. Secondary Dressing Applied ABD Pad 7. Secured with Tape 3 Layer Compression System - Bilateral Electronic Signature(s) Signed: 03/01/2016 5:23:57 PM By: Alejandro MullingPinkerton, Debra Entered By: Alejandro MullingPinkerton, Debra on 03/01/2016 11:25:23 [name] OttoLINDLEY, [name] ([id number]) -------------------------------------------------------------------------------- Wound Assessment Details Patient Name: [name] OttoLINDLEY, Tremell V. Date of Service: 03/01/2016 10:45 AM Medical Record Number: [id number] Patient Account Number: [id number] Date of Birth/Sex: 03-25-1946 [age] y.o. Male) Treating RN: Phillis HaggisPinkerton, Debi Primary Care Physician: Oretha MilchSMITH, [name] Other Clinician: Referring Physician: Oretha MilchSMITH, [name] Treating Physician/Extender: Rudene ReBritto, Errol Weeks in Treatment: 8 Wound Status Wound Number: 2 Primary Venous Leg  Ulcer Etiology: Wound Location: Right Lower Leg Wound Open Wounding Event: Gradually Appeared Status: Date Acquired: 12/31/2014 Comorbid Cataracts, Chronic Obstructive Weeks Of Treatment: 8 History: Pulmonary Disease (COPD), Sleep Clustered Wound: No Apnea, Angina, Congestive Heart Failure, Hypertension, Type II Diabetes, Gout, Osteoarthritis, Neuropathy Photos Photo Uploaded By: Alejandro MullingPinkerton, Debra on 03/01/2016 17:00:43 Wound Measurements Length: (cm) 3 Width: (cm) 2 Depth: (cm) 0.1 Area: (cm) 4.712 Volume: (cm) 0.471 % Reduction in Area: -1896.6% % Reduction in Volume: -1862.5% Epithelialization: None Tunneling: No Undermining: No Wound Description Classification: Partial Thickness Foul Odor A Diabetic Severity (Wagner): Grade 1 Wound Margin: Flat and Intact Exudate Amount: None Present fter Cleansing: No Wound Bed Granulation Amount: None Present (0%) Exposed Structure Necrotic Amount: Large (67-100%) Fascia Exposed: No Necrotic Quality: Eschar Fat Layer Exposed: No [name], [name] ([id number]) Tendon Exposed: No Muscle Exposed: No Joint Exposed: No Bone Exposed: No Limited to Skin Breakdown Periwound Skin Texture Texture Color No Abnormalities Noted: No No Abnormalities Noted: No Localized Edema: No Erythema: Yes Erythema Location: Circumferential  Moisture No Abnormalities Noted: No Temperature / Pain Moist: No Temperature: No Abnormality Tenderness on Palpation: Yes Wound Preparation Ulcer Cleansing: Other: soap and water, Treatment Notes Wound #2 (Right Lower Leg) 1. Cleansed with: Cleanse wound with antibacterial soap and water 3. Peri-wound Care: Barrier cream 4. Dressing Applied: Aquacel Ag 5. Secondary Dressing Applied ABD Pad 7. Secured with Tape 3 Layer Compression System - Bilateral Electronic Signature(s) Signed: 03/01/2016 5:23:57 PM By: Alejandro Mulling Entered By: Alejandro Mulling on 03/01/2016 11:20:33 [name]  ([id number]) -------------------------------------------------------------------------------- Wound Assessment Details Patient Name: [name] Date of Service: 03/01/2016 10:45 AM Medical Record Number: [id number] Patient Account Number: [id number] Date of Birth/Sex: 14-Oct-1946 (70 y.o. Male) Treating RN: Phillis Haggis Primary Care Physician: Oretha Milch Other Clinician: Referring Physician: Oretha Milch Treating Physician/Extender: Rudene Re in Treatment: 8 Wound Status Wound Number: 3 Primary Pressure Ulcer Etiology: Wound Location: Right Calcaneus Wound Open Wounding Event: Pressure Injury Status: Date Acquired: 10/31/2015 Comorbid Cataracts, Chronic Obstructive Weeks Of Treatment: 8 History: Pulmonary Disease (COPD), Sleep Clustered Wound: No Apnea, Angina, Congestive Heart Failure, Hypertension, Type II Diabetes, Gout, Osteoarthritis, Neuropathy Photos Photo Uploaded By: Alejandro Mulling on 03/01/2016 17:01:00 Wound Measurements Length: (cm) 2.9 Width: (cm) 3.3 Depth: (cm) 0.2 Area: (cm) 7.516 Volume: (cm) 1.503 % Reduction in Area: -6.3% % Reduction in Volume: -6.3% Epithelialization: None Tunneling: No Undermining: No Wound Description Classification: Category/Stage III Foul Odor Aft Diabetic Severity (Wagner): Grade 1 Wound Margin: Flat and Intact Exudate Amount: Large Exudate Type: Serous Exudate Color: amber er Cleansing: No Wound Bed Granulation Amount: Small (1-33%) Exposed Structure [name], [name] ([id number]) Granulation Quality: Red, Pink Fascia Exposed: No Necrotic Amount: Large (67-100%) Fat Layer Exposed: No Necrotic Quality: Eschar, Adherent Slough Tendon Exposed: No Muscle Exposed: No Joint Exposed: No Bone Exposed: No Limited to Skin Breakdown Periwound Skin Texture Texture Color No Abnormalities Noted: No No Abnormalities Noted: No Localized Edema: Yes Erythema: Yes Erythema Location:  Circumferential Moisture No Abnormalities Noted: No Temperature / Pain Maceration: Yes Temperature: No Abnormality Moist: Yes Tenderness on Palpation: Yes Wound Preparation Ulcer Cleansing: Rinsed/Irrigated with Saline Topical Anesthetic Applied: Other: lidocaine 4%, Treatment Notes Wound #3 (Right Calcaneus) 1. Cleansed with: Cleanse wound with antibacterial soap and water 2. Anesthetic Topical Lidocaine 4% cream to wound bed prior to debridement 3. Peri-wound Care: Barrier cream 4. Dressing Applied: Santyl Ointment 5. Secondary Dressing Applied ABD Pad Dry Gauze Kerlix/Conform 7. Secured with Secretary/administrator) Signed: 03/01/2016 5:23:57 PM By: Alejandro Mulling Entered By: Alejandro Mulling on 03/01/2016 11:26:49 [name] ([id number]) -------------------------------------------------------------------------------- Wound Assessment Details Patient Name: [name] Date of Service: 03/01/2016 10:45 AM Medical Record Number: [id number] Patient Account Number: [id number] Date of Birth/Sex: 04/05/1946 (70 y.o. Male) Treating RN: Phillis Haggis Primary Care Physician: Oretha Milch Other Clinician: Referring Physician: Oretha Milch Treating Physician/Extender: Rudene Re in Treatment: 8 Wound Status Wound Number: 6 Primary Pressure Ulcer Etiology: Wound Location: Right Foot - Lateral Wound Open Wounding Event: Pressure Injury Status: Date Acquired: 02/16/2016 Comorbid Cataracts, Chronic Obstructive Weeks Of Treatment: 2 History: Pulmonary Disease (COPD), Sleep Clustered Wound: No Apnea, Angina, Congestive Heart Failure, Hypertension, Type II Diabetes, Gout, Osteoarthritis, Neuropathy Photos Photo Uploaded By: Alejandro Mulling on 03/01/2016 17:01:00 Wound Measurements Length: (cm) 0.5 Width: (cm) 0.4 Depth: (cm) 0.1 Area: (cm) 0.157 Volume: (cm) 0.016 % Reduction in Area: 0% % Reduction in Volume: 0% Epithelialization:  None Tunneling: No Undermining: No Wound Description Classification: Category/Stage II Diabetic Severity [address]): Grade 1 Wound Margin: Flat  and Intact Exudate Amount: Large Exudate Type: Serosanguineous Exudate Color: red, brown Foul Odor After Cleansing: No Wound Bed Granulation Amount: None Present (0%) Exposed Structure [name], [name] ([id number]) Necrotic Amount: Large (67-100%) Fascia Exposed: No Necrotic Quality: Eschar, Adherent Slough Fat Layer Exposed: No Tendon Exposed: No Muscle Exposed: No Joint Exposed: No Bone Exposed: No Limited to Skin Breakdown Periwound Skin Texture Texture Color No Abnormalities Noted: No No Abnormalities Noted: No Moisture Temperature / Pain No Abnormalities Noted: No Temperature: No Abnormality Moist: Yes Tenderness on Palpation: Yes Wound Preparation Ulcer Cleansing: Rinsed/Irrigated with Saline Topical Anesthetic Applied: Other: lidocaine 4%, Treatment Notes Wound #6 (Right, Lateral Foot) 1. Cleansed with: Cleanse wound with antibacterial soap and water 3. Peri-wound Care: Barrier cream 4. Dressing Applied: Aquacel Ag 5. Secondary Dressing Applied ABD Pad 7. Secured with Tape 3 Layer Compression System - Bilateral Electronic Signature(s) Signed: 03/01/2016 5:23:57 PM By: Alejandro Mulling Entered By: Alejandro Mulling on 03/01/2016 11:19:12 Delacruz, Jatin VMarland Kitchen ([id number]) -------------------------------------------------------------------------------- Wound Assessment Details Patient Name: [name] Date of Service: 03/01/2016 10:45 AM Medical Record Number: [id number] Patient Account Number: [id number] Date of Birth/Sex: [birth year] (70 y.o. Male) Treating RN: Phillis Haggis Primary Care Physician: Oretha Milch Other Clinician: Referring Physician: Oretha Milch Treating Physician/Extender: Rudene Re in Treatment: 8 Wound Status Wound Number: 7 Primary Pressure Ulcer Etiology: Wound Location:  Coccyx - Midline Wound Open Wounding Event: Pressure Injury Status: Date Acquired: 02/23/2016 Comorbid Cataracts, Chronic Obstructive Weeks Of Treatment: 1 History: Pulmonary Disease (COPD), Sleep Clustered Wound: No Apnea, Angina, Congestive Heart Failure, Hypertension, Type II Diabetes, Gout, Osteoarthritis, Neuropathy Photos Photo Uploaded By: Alejandro Mulling on 03/01/2016 17:01:09 Wound Measurements Length: (cm) 4 Width: (cm) 3 Depth: (cm) 0.1 Area: (cm) 9.425 Volume: (cm) 0.942 % Reduction in Area: 60.3% % Reduction in Volume: 60.4% Epithelialization: None Tunneling: No Undermining: No Wound Description Classification: Category/Stage II Wound Margin: Flat and Intact Exudate Amount: Large Exudate Type: Serosanguineous Exudate Color: red, brown Foul Odor After Cleansing: No Wound Bed Granulation Amount: Large (67-100%) Exposed Structure Granulation Quality: Red Fascia Exposed: No [name], [name] V. ([id number]) Necrotic Amount: Small (1-33%) Fat Layer Exposed: No Necrotic Quality: Eschar, Adherent Slough Tendon Exposed: No Muscle Exposed: No Joint Exposed: No Bone Exposed: No Limited to Skin Breakdown Periwound Skin Texture Texture Color No Abnormalities Noted: No No Abnormalities Noted: No Erythema: Yes Moisture Erythema Location: Circumferential No Abnormalities Noted: No Moist: Yes Temperature / Pain Temperature: No Abnormality Tenderness on Palpation: Yes Wound Preparation Ulcer Cleansing: Rinsed/Irrigated with Saline Topical Anesthetic Applied: Other: lidocaine 4%, Treatment Notes Wound #7 (Midline Coccyx) 1. Cleansed with: Clean wound with Normal Saline 3. Peri-wound Care: Barrier cream 4. Dressing Applied: Aquacel Ag 5. Secondary Dressing Applied Bordered Foam Dressing Electronic Signature(s) Signed: 03/01/2016 5:23:57 PM By: Alejandro Mulling Entered By: Alejandro Mulling on 03/01/2016 11:05:42 [name]  ([id number]) -------------------------------------------------------------------------------- Vitals Details Patient Name: [name] Date of Service: 03/01/2016 10:45 AM Medical Record Number: [id number] Patient Account Number: [id number] Date of Birth/Sex: 1946-02-14 (70 y.o. Male) Treating RN: Phillis Haggis Primary Care Physician: Oretha Milch Other Clinician: Referring Physician: Oretha Milch Treating Physician/Extender: Rudene Re in Treatment: 8 Vital Signs Time Taken: 10:56 Pulse (bpm): 61 Height (in): 70 Respiratory Rate (breaths/min): 18 Weight (lbs): 235 Blood Pressure (mmHg): 113/57 Body Mass Index (BMI): 33.7 Reference Range: 80 - 120 mg / dl Electronic Signature(s) Signed: 03/01/2016 5:23:57 PM By: Alejandro Mulling Entered By: Alejandro Mulling on 03/01/2016 13:17:45

## 2016-03-07 ENCOUNTER — Other Ambulatory Visit: Payer: Self-pay | Admitting: Vascular Surgery

## 2016-03-08 ENCOUNTER — Encounter: Payer: Medicare Other | Admitting: Surgery

## 2016-03-08 DIAGNOSIS — E11621 Type 2 diabetes mellitus with foot ulcer: Secondary | ICD-10-CM | POA: Diagnosis not present

## 2016-03-09 NOTE — Progress Notes (Signed)
[name], [name] ([id number]) Visit Report for 03/08/2016 Arrival Information Details Patient Name: [name], [name] Date of Service: 03/08/2016 12:45 PM Medical Record Number: [id number] Patient Account Number: [id number] Date of Birth/Sex: [birth year] [age] y.o. Male) Treating RN: Curtis Sitesorthy, Joanna Primary Care Physician: Oretha MilchSMITH, [name] Other Clinician: Referring Physician: Oretha MilchSMITH, [name] Treating Physician/Extender: Rudene ReBritto, Errol Weeks in Treatment: 9 Visit Information History Since Last Visit Added or deleted any medications: No Patient Arrived: Wheel Chair Any new allergies or adverse reactions: No Arrival Time: 12:47 Had a fall or experienced change in No activities of daily living that may affect Accompanied By: self risk of falls: Transfer Assistance: Manual Signs or symptoms of abuse/neglect since last No Patient Identification Verified: Yes visito Secondary Verification Process Yes Hospitalized since last visit: No Completed: Pain Present Now: No Patient Requires Transmission-Based No Precautions: Patient Has Alerts: Yes Patient Alerts: DM II Electronic Signature(s) Signed: 03/08/2016 4:42:56 PM By: Curtis Sitesorthy, Joanna Entered By: Curtis Sitesorthy, Joanna on 03/08/2016 12:54:34 [name], [name] ([id number]) -------------------------------------------------------------------------------- Encounter Discharge Information Details Patient Name: [name], [name] Date of Service: 03/08/2016 12:45 PM Medical Record Number: [id number] Patient Account Number: [id number] Date of Birth/Sex: [birth year] [age] y.o. Male) Treating RN: Phillis HaggisPinkerton, Debi Primary Care Physician: Oretha MilchSMITH, [name] Other Clinician: Referring Physician: Oretha MilchSMITH, [name] Treating Physician/Extender: Rudene ReBritto, Errol Weeks in Treatment: 9 Encounter Discharge Information Items Schedule Follow-up Appointment: No Medication Reconciliation completed No and provided to Patient/Care [name]: Provided on Clinical Summary of Care: 03/08/2016 Form  Type Recipient Paper Patient EL Electronic Signature(s) Signed: 03/08/2016 1:45:42 PM By: Gwenlyn PerkingMoore, Shelia Entered By: Gwenlyn PerkingMoore, Shelia on 03/08/2016 13:45:42 [name], [name] ([id number]) -------------------------------------------------------------------------------- Multi Wound Chart Details Patient Name: [name], [name] Date of Service: 03/08/2016 12:45 PM Medical Record Number: [id number] Patient Account Number: [id number] Date of Birth/Sex: [birth year] [age] y.o. Male) Treating RN: Curtis Sitesorthy, Joanna Primary Care Physician: Oretha MilchSMITH, [name] Other Clinician: Referring Physician: Oretha MilchSMITH, [name] Treating Physician/Extender: Rudene ReBritto, Errol Weeks in Treatment: 9 Vital Signs Height(in): 70 Pulse(bpm): 53 Weight(lbs): 235 Blood Pressure 108/65 (mmHg): Body Mass Index(BMI): 34 Temperature(F): Respiratory Rate 18 (breaths/min): Photos: [1:No Photos] [2:No Photos] [3:No Photos] Wound Location: [1:Left Lower Leg] [2:Right Lower Leg] [3:Right Calcaneus] Wounding Event: [1:Gradually Appeared] [2:Gradually Appeared] [3:Pressure Injury] Primary Etiology: [1:Venous Leg Ulcer] [2:Venous Leg Ulcer] [3:Pressure Ulcer] Comorbid History: [1:Cataracts, Chronic Obstructive Pulmonary Disease (COPD), Sleep Disease (COPD), Sleep Disease (COPD), Sleep Apnea, Angina, Congestive Heart Failure, Congestive Heart Failure, Congestive Heart Failure, Hypertension, Type II Diabetes,  Gout, Osteoarthritis, Neuropathy Osteoarthritis, Neuropathy Osteoarthritis, Neuropathy] [2:Cataracts, Chronic Obstructive Pulmonary Apnea, Angina, Hypertension, Type II Diabetes, Gout,] [3:Cataracts, Chronic Obstructive Pulmonary Apnea, Angina,  Hypertension, Type II Diabetes, Gout,] Date Acquired: [1:12/31/2014] [2:12/31/2014] [3:10/31/2015] Weeks of Treatment: [1:9] [2:9] [3:9] Wound Status: [1:Open] [2:Open] [3:Open] Measurements L x W x D 0.1x0.1x0.1 [2:0.1x0.1x0.1] [3:2.6x3.1x0.2] (cm) Area (cm) : [1:0.008] [2:0.008] [3:6.33] Volume (cm) :  [1:0.001] [2:0.001] [3:1.266] % Reduction in Area: [1:100.00%] [2:96.60%] [3:10.50%] % Reduction in Volume: 100.00% [2:95.80%] [3:10.50%] Classification: [1:Partial Thickness] [2:Partial Thickness] [3:Category/Stage III] HBO Classification: [1:Grade 1] [2:Grade 1] [3:Grade 1] Exudate Amount: [1:Large] [2:None Present] [3:Large] Exudate Type: [1:Serosanguineous] [2:N/A] [3:Serous] Exudate Color: [1:red, brown] [2:N/A] [3:amber] Wound Margin: [1:Flat and Intact] [2:Flat and Intact] [3:Flat and Intact] Granulation Amount: [1:Large (67-100%)] [2:Large (67-100%)] [3:Small (1-33%)] Granulation Quality: [1:Pink] [2:Red] [3:Red, Pink] Necrotic Amount: [1:None Present (0%)] [2:None Present (0%)] [3:Large (67-100%)] Necrotic Tissue: N/A N/A Eschar, Adherent Slough Exposed Structures: Fascia: No Fascia: No Fascia: No Fat: No Fat: No Fat: No Tendon: No Tendon: No Tendon: No Muscle: No Muscle: No Muscle: No Joint:  No Joint: No Joint: No Bone: No Bone: No Bone: No Limited to Skin Limited to Skin Limited to Skin Breakdown Breakdown Breakdown Epithelialization: Large (67-100%) Large (67-100%) None Periwound Skin Texture: Edema: Yes Edema: No Edema: Yes Periwound Skin Moist: Yes Moist: No Maceration: Yes Moisture: Moist: Yes Periwound Skin Color: Erythema: Yes Erythema: Yes Erythema: Yes Erythema Location: Circumferential Circumferential Circumferential Temperature: No Abnormality No Abnormality No Abnormality Tenderness on Yes Yes Yes Palpation: Wound Preparation: Ulcer Cleansing: Other: Ulcer Cleansing: Other: Ulcer Cleansing: soap and water soap and water Rinsed/Irrigated with Saline Topical Anesthetic Topical Anesthetic Applied: None Applied: None Topical Anesthetic Applied: Other: lidocaine 4% Wound Number: 6 N/A N/A Photos: No Photos N/A N/A Wound Location: Right Foot - Lateral N/A N/A Wounding Event: Pressure Injury N/A N/A Primary Etiology: Pressure Ulcer N/A  N/A Comorbid History: Cataracts, Chronic N/A N/A Obstructive Pulmonary Disease (COPD), Sleep Apnea, Angina, Congestive Heart Failure, Hypertension, Type II Diabetes, Gout, Osteoarthritis, Neuropathy Date Acquired: 02/16/2016 N/A N/A Weeks of Treatment: 3 N/A N/A Wound Status: Open N/A N/A Measurements L x W x D 0.4x0.4x0.1 N/A N/A (cm) Area (cm) : 0.126 N/A N/A Volume (cm) : 0.013 N/A N/A % Reduction in Area: 19.70% N/A N/A % Reduction in Volume: 18.80% N/A N/A Classification: Category/Stage II N/A N/A HBO Classification: Grade 1 N/A N/A [name], [name] ([id number]) Exudate Amount: None Present N/A N/A Exudate Type: N/A N/A N/A Exudate Color: N/A N/A N/A Wound Margin: Flat and Intact N/A N/A Granulation Amount: None Present (0%) N/A N/A Granulation Quality: N/A N/A N/A Necrotic Amount: Large (67-100%) N/A N/A Necrotic Tissue: Eschar, Adherent Slough N/A N/A Exposed Structures: Fascia: No N/A N/A Fat: No Tendon: No Muscle: No Joint: No Bone: No Limited to Skin Breakdown Epithelialization: None N/A N/A Periwound Skin Texture: No Abnormalities Noted N/A N/A Periwound Skin Moist: Yes N/A N/A Moisture: Periwound Skin Color: No Abnormalities Noted N/A N/A Erythema Location: N/A N/A N/A Temperature: No Abnormality N/A N/A Tenderness on Yes N/A N/A Palpation: Wound Preparation: Ulcer Cleansing: N/A N/A Rinsed/Irrigated with Saline Topical Anesthetic Applied: Other: lidocaine 4% Treatment Notes Electronic Signature(s) Signed: 03/08/2016 1:11:31 PM By: [name] Entered By: [name] on 03/08/2016 13:11:31 [name] ([id number]) -------------------------------------------------------------------------------- Multi-Disciplinary Care Plan Details Patient Name: [name] Date of Service: 03/08/2016 12:45 PM Medical Record Number: [id number] Patient Account Number: [id number] Date of Birth/Sex: Mar 28, 1946 (70 y.o. Male) Treating RN: [name] Primary Care Physician: Oretha Milch Other Clinician: Referring Physician: Oretha Milch Treating Physician/Extender: Rudene Re in Treatment: 9 Active Inactive Abuse / Safety / Falls / Self Care Management Nursing Diagnoses: Potential for falls Goals: Patient will remain injury free Date Initiated: 03/01/2016 Goal Status: Active Interventions: Assess fall risk on admission and as needed Notes: Nutrition Nursing Diagnoses: Imbalanced nutrition Goals: Patient/caregiver agrees to and verbalizes understanding of need to use nutritional supplements and/or vitamins as prescribed Date Initiated: 03/01/2016 Goal Status: Active Interventions: Assess patient nutrition upon admission and as needed per policy Notes: Orientation to the Wound Care Program Nursing Diagnoses: Knowledge deficit related to the wound healing center program Goals: Patient/caregiver will verbalize understanding of the [facility] [address] NATHON, STEFANSKI ([id number]) Date Initiated: 03/01/2016 Goal Status: Active Interventions: Provide education on orientation to the wound center Notes: Pain, Acute or Chronic Nursing Diagnoses: Pain, acute or chronic: actual or potential Potential alteration in comfort, pain Goals: Patient will verbalize adequate pain control and receive pain control interventions during procedures as needed Date Initiated: 03/01/2016 Goal Status: Active Patient/caregiver  will verbalize adequate pain control between visits Date Initiated: 03/01/2016 Goal Status: Active Interventions: Assess comfort goal upon admission Complete pain assessment as per visit requirements Notes: Pressure Nursing Diagnoses: Knowledge deficit related to causes and risk factors for pressure ulcer development Knowledge deficit related to management of pressures ulcers Goals: Patient/caregiver will verbalize risk factors for pressure ulcer development Date Initiated: 03/01/2016 Goal  Status: Active Interventions: Assess offloading mechanisms upon admission and as needed Notes: Wound/Skin Impairment Nursing Diagnoses: BRACY, PEPPER ([id number]) Impaired tissue integrity Goals: Ulcer/skin breakdown will have a volume reduction of 30% by week 4 Date Initiated: 03/01/2016 Goal Status: Active Ulcer/skin breakdown will have a volume reduction of 50% by week 8 Date Initiated: 03/01/2016 Goal Status: Active Ulcer/skin breakdown will have a volume reduction of 80% by week 12 Date Initiated: 03/01/2016 Goal Status: Active Interventions: Assess ulceration(s) every visit Notes: Electronic Signature(s) Signed: 03/08/2016 1:11:08 PM By: [name] Entered By: [name] on 03/08/2016 13:11:07 [name], [name] Seth Bake ([id number]) -------------------------------------------------------------------------------- Wound Assessment Details Patient Name: [name] Date of Service: 03/08/2016 12:45 PM Medical Record Number: [id number] Patient Account Number: [id number] Date of Birth/Sex: May 04, 1946 (70 y.o. Male) Treating RN: [name] Primary Care Physician: Oretha Milch Other Clinician: Referring Physician: Oretha Milch Treating Physician/Extender: Rudene Re in Treatment: 9 Wound Status Wound Number: 1 Primary Venous Leg Ulcer Etiology: Wound Location: Left Lower Leg Wound Open Wounding Event: Gradually Appeared Status: Date Acquired: 12/31/2014 Comorbid Cataracts, Chronic Obstructive Weeks Of Treatment: 9 History: Pulmonary Disease (COPD), Sleep Clustered Wound: No Apnea, Angina, Congestive Heart Failure, Hypertension, Type II Diabetes, Gout, Osteoarthritis, Neuropathy Photos Photo Uploaded By: [name] on 03/08/2016 15:01:19 Wound Measurements Length: (cm) 0.1 Width: (cm) 0.1 Depth: (cm) 0.1 Area: (cm) 0.008 Volume: (cm) 0.001 % Reduction in Area: 100% % Reduction in Volume: 100% Epithelialization: Large (67-100%) Tunneling:  No Undermining: No Wound Description Classification: Partial Thickness Foul Odor Diabetic Severity (Wagner): Grade 1 Wound Margin: Flat and Intact Exudate Amount: Large Exudate Type: Serosanguineous Exudate Color: red, brown After Cleansing: No Wound Bed Granulation Amount: Large (67-100%) Exposed Structure Codner, Rube V. ([id number]) Granulation Quality: Pink Fascia Exposed: No Necrotic Amount: None Present (0%) Fat Layer Exposed: No Tendon Exposed: No Muscle Exposed: No Joint Exposed: No Bone Exposed: No Limited to Skin Breakdown Periwound Skin Texture Texture Color No Abnormalities Noted: No No Abnormalities Noted: No Localized Edema: Yes Erythema: Yes Erythema Location: Circumferential Moisture No Abnormalities Noted: No Temperature / Pain Moist: Yes Temperature: No Abnormality Tenderness on Palpation: Yes Wound Preparation Ulcer Cleansing: Other: soap and water, Topical Anesthetic Applied: None Electronic Signature(s) Signed: 03/08/2016 1:09:58 PM By: [name] Entered By: [name] on 03/08/2016 13:09:57 [name], [name] Seth Bake ([id number]) -------------------------------------------------------------------------------- Wound Assessment Details Patient Name: [name] Date of Service: 03/08/2016 12:45 PM Medical Record Number: [id number] Patient Account Number: [id number] Date of Birth/Sex: 02/16/1946 (70 y.o. Male) Treating RN: [name] Primary Care Physician: Oretha Milch Other Clinician: Referring Physician: Oretha Milch Treating Physician/Extender: Rudene Re in Treatment: 9 Wound Status Wound Number: 2 Primary Venous Leg Ulcer Etiology: Wound Location: Right Lower Leg Wound Open Wounding Event: Gradually Appeared Status: Date Acquired: 12/31/2014 Comorbid Cataracts, Chronic Obstructive Weeks Of Treatment: 9 History: Pulmonary Disease (COPD), Sleep Clustered Wound: No Apnea, Angina, Congestive Heart Failure,  Hypertension, Type II Diabetes, Gout, Osteoarthritis, Neuropathy Photos Photo Uploaded By: [name] on 03/08/2016 15:01:20 Wound Measurements Length: (cm) 0.1 Width: (cm) 0.1 Depth: (cm) 0.1 Area: (cm) 0.008 Volume: (cm) 0.001 % Reduction in Area: 96.6% %  Reduction in Volume: 95.8% Epithelialization: Large (67-100%) Tunneling: No Undermining: No Wound Description Classification: Partial Thickness Foul Odor A Diabetic Severity (Wagner): Grade 1 Wound Margin: Flat and Intact Exudate Amount: None Present fter Cleansing: No Wound Bed Granulation Amount: Large (67-100%) Exposed Structure Granulation Quality: Red Fascia Exposed: No Necrotic Amount: None Present (0%) Fat Layer Exposed: No [name], [name] ([id number]) Tendon Exposed: No Muscle Exposed: No Joint Exposed: No Bone Exposed: No Limited to Skin Breakdown Periwound Skin Texture Texture Color No Abnormalities Noted: No No Abnormalities Noted: No Localized Edema: No Erythema: Yes Erythema Location: Circumferential Moisture No Abnormalities Noted: No Temperature / Pain Moist: No Temperature: No Abnormality Tenderness on Palpation: Yes Wound Preparation Ulcer Cleansing: Other: soap and water, Topical Anesthetic Applied: None Electronic Signature(s) Signed: 03/08/2016 1:10:17 PM By: [name] Entered By: [name] on 03/08/2016 13:10:17 [name], [name] Seth Bake ([id number]) -------------------------------------------------------------------------------- Wound Assessment Details Patient Name: [name] Date of Service: 03/08/2016 12:45 PM Medical Record Number: [id number] Patient Account Number: [id number] Date of Birth/Sex: 1946-03-17 (70 y.o. Male) Treating RN: [name] Primary Care Physician: Oretha Milch Other Clinician: Referring Physician: Oretha Milch Treating Physician/Extender: Rudene Re in Treatment: 9 Wound Status Wound Number: 3 Primary Pressure  Ulcer Etiology: Wound Location: Right Calcaneus Wound Open Wounding Event: Pressure Injury Status: Date Acquired: 10/31/2015 Comorbid Cataracts, Chronic Obstructive Weeks Of Treatment: 9 History: Pulmonary Disease (COPD), Sleep Clustered Wound: No Apnea, Angina, Congestive Heart Failure, Hypertension, Type II Diabetes, Gout, Osteoarthritis, Neuropathy Photos Photo Uploaded By: [name] on 03/08/2016 15:01:43 Wound Measurements Length: (cm) 2.6 Width: (cm) 3.1 Depth: (cm) 0.2 Area: (cm) 6.33 Volume: (cm) 1.266 % Reduction in Area: 10.5% % Reduction in Volume: 10.5% Epithelialization: None Tunneling: No Undermining: No Wound Description Classification: Category/Stage III Foul Odor Aft Diabetic Severity (Wagner): Grade 1 Wound Margin: Flat and Intact Exudate Amount: Large Exudate Type: Serous Exudate Color: amber er Cleansing: No Wound Bed Granulation Amount: Small (1-33%) Exposed Structure [name], [name] ([id number]) Granulation Quality: Red, Pink Fascia Exposed: No Necrotic Amount: Large (67-100%) Fat Layer Exposed: No Necrotic Quality: Eschar, Adherent Slough Tendon Exposed: No Muscle Exposed: No Joint Exposed: No Bone Exposed: No Limited to Skin Breakdown Periwound Skin Texture Texture Color No Abnormalities Noted: No No Abnormalities Noted: No Localized Edema: Yes Erythema: Yes Erythema Location: Circumferential Moisture No Abnormalities Noted: No Temperature / Pain Maceration: Yes Temperature: No Abnormality Moist: Yes Tenderness on Palpation: Yes Wound Preparation Ulcer Cleansing: Rinsed/Irrigated with Saline Topical Anesthetic Applied: Other: lidocaine 4%, Electronic Signature(s) Signed: 03/08/2016 1:10:37 PM By: [name] Entered By: [name] on 03/08/2016 13:10:37 [name] ([id number]) -------------------------------------------------------------------------------- Wound Assessment Details Patient Name:  [name] Date of Service: 03/08/2016 12:45 PM Medical Record Number: [id number] Patient Account Number: [id number] Date of Birth/Sex: 06-29-1946 (70 y.o. Male) Treating RN: [name] Primary Care Physician: Oretha Milch Other Clinician: Referring Physician: Oretha Milch Treating Physician/Extender: Rudene Re in Treatment: 9 Wound Status Wound Number: 6 Primary Pressure Ulcer Etiology: Wound Location: Right, Lateral Foot Wound Healed - Epithelialized Wounding Event: Pressure Injury Status: Date Acquired: 02/16/2016 Comorbid Cataracts, Chronic Obstructive Weeks Of Treatment: 3 History: Pulmonary Disease (COPD), Sleep Clustered Wound: No Apnea, Angina, Congestive Heart Failure, Hypertension, Type II Diabetes, Gout, Osteoarthritis, Neuropathy Photos Photo Uploaded By: [name] on 03/08/2016 15:01:43 Wound Measurements Length: (cm) 0 Width: (cm) 0 Depth: (cm) 0 Area: (cm) 0 Volume: (cm) 0 % Reduction in Area: 100% % Reduction in Volume: 100% Epithelialization: None Tunneling: No Undermining: No Wound Description Classification:  Category/Stage II Diabetic Severity [address]): Grade 1 Wound Margin: Flat and Intact Exudate Amount: None Present Foul Odor After Cleansing: No Wound Bed Granulation Amount: None Present (0%) Exposed Structure Necrotic Amount: Large (67-100%) Fascia Exposed: No Necrotic Quality: Eschar, Adherent Slough Fat Layer Exposed: No [name], [name] ([id number]) Tendon Exposed: No Muscle Exposed: No Joint Exposed: No Bone Exposed: No Limited to Skin Breakdown Periwound Skin Texture Texture Color No Abnormalities Noted: No No Abnormalities Noted: No Moisture Temperature / Pain No Abnormalities Noted: No Temperature: No Abnormality Moist: Yes Tenderness on Palpation: Yes Wound Preparation Ulcer Cleansing: Rinsed/Irrigated with Saline Topical Anesthetic Applied: Other: lidocaine 4%, Electronic Signature(s) Signed:  03/08/2016 4:42:56 PM By: [name] Previous Signature: 03/08/2016 1:10:58 PM Version By: [name] Entered By: [name] on 03/08/2016 13:23:15 [name] ([id number]) -------------------------------------------------------------------------------- Wound Assessment Details Patient Name: [name] Date of Service: 03/08/2016 12:45 PM Medical Record Number: [id number] Patient Account Number: [id number] Date of Birth/Sex: [birth year] (70 y.o. Male) Treating RN: [name] Primary Care Physician: Oretha Milch Other Clinician: Referring Physician: Oretha Milch Treating Physician/Extender: Rudene Re in Treatment: 9 Wound Status Wound Number: 7 Primary Etiology: Pressure Ulcer Wound Location: Midline Coccyx Wound Status: Open Wounding Event: Pressure Injury Date Acquired: 02/23/2016 Weeks Of Treatment: 2 Clustered Wound: No Wound Measurements Length: (cm) 4 Width: (cm) 3 Depth: (cm) 0.1 Area: (cm) 9.425 Volume: (cm) 0.942 % Reduction in Area: 60.3% % Reduction in Volume: 60.4% Wound Description Classification: Category/Stage II Periwound Skin Texture Texture Color No Abnormalities Noted: No No Abnormalities Noted: No Moisture No Abnormalities Noted: No Electronic Signature(s) Signed: 03/08/2016 4:42:56 PM By: [name] Entered By: [name] on 03/08/2016 13:23:21 [name] ([id number]) -------------------------------------------------------------------------------- Vitals Details Patient Name: [name] Date of Service: 03/08/2016 12:45 PM Medical Record Number: [id number] Patient Account Number: [id number] Date of Birth/Sex: 12-31-1945 (70 y.o. Male) Treating RN: [name] Primary Care Physician: Oretha Milch Other Clinician: Referring Physician: Oretha Milch Treating Physician/Extender: Rudene Re in Treatment: 9 Vital Signs Time Taken: 12:55 Pulse (bpm): 53 Height (in): 70 Respiratory Rate  (breaths/min): 18 Weight (lbs): 235 Blood Pressure (mmHg): 108/65 Body Mass Index (BMI): 33.7 Reference Range: 80 - 120 mg / dl Electronic Signature(s) Signed: 03/08/2016 4:42:56 PM By: [name] Entered By: [name] on 03/08/2016 12:55:52

## 2016-03-09 NOTE — Progress Notes (Signed)
[name] ([id number]) Visit Report for 03/08/2016 Chief Complaint Document Details Patient Name: [name], [name] Date of Service: 03/08/2016 12:45 PM Medical Record Number: [id number] Patient Account Number: [id number] Date of Birth/Sex: 1946-08-14 (70 y.o. Male) Treating RN: Phillis Haggis Primary Care Physician: Oretha Milch Other Clinician: Referring Physician: Oretha Milch Treating Physician/Extender: Rudene Re in Treatment: 9 Information Obtained from: Patient Chief Complaint Patient is at the clinic for treatment of an open pressure ulcer the left upper thigh and gluteal region and the right heel with bilateral swelling of the legs all of it which is going on for over a year Electronic Signature(s) Signed: 03/08/2016 1:30:59 PM By: Evlyn Kanner MD, FACS Entered By: Evlyn Kanner on 03/08/2016 13:30:58 [name] ([id number]) -------------------------------------------------------------------------------- HPI Details Patient Name: [name] Date of Service: 03/08/2016 12:45 PM Medical Record Number: [id number] Patient Account Number: [id number] Date of Birth/Sex: [birth year] (71 y.o. Male) Treating RN: Phillis Haggis Primary Care Physician: Oretha Milch Other Clinician: Referring Physician: Oretha Milch Treating Physician/Extender: Rudene Re in Treatment: 9 History of Present Illness Location: ulcerated area on the right heel, left gluteal region and thigh and then bilateral lower extremities Quality: Patient reports experiencing a sharp pain to affected area(s). Severity: Patient states wound are getting worse. Duration: Patient has had the wound for > 12 months prior to seeking treatment at the wound center Timing: Pain in wound is constant (hurts all the time) Context: The wound appeared gradually over time Modifying Factors: Other treatment(s) tried include: he sees his heart doctor and his primary care doctor Associated Signs and  Symptoms: patient has not been able to walk for over a year now HPI Description: 70 year old gentleman with a known history of hypertension, diabetes, obstructive sleep apnea, COPD, diastolic CHF, coronary artery disease was admitted to the hospital with sepsis from an ulcer of the right heel and was treated there in October 2016. he also has chronic bilateral lower extremity edema and lymphedema. He had received vancomycin, Zosyn and at that stage and x-rays showed hardware in the right ankle but no evidence of osteomyelitis. he was a former smoker. he was also treated with Augmentin orally for 10 days. He is either bedbound or wheelchair-bound and does not ambulate by himself. 01/19/2016 -- he has not been seen here for 3 weeks and this was because he was admitted to [facility] between 223 and 01/08/2016 for sepsis, UTI and pneumonia involving the left lung. he was treated with IV vancomycin and Zosyn and then meropenem. Was discharged home on oral Bactrim for 2 weeks none of his vascular test or x-rays were done and we will reorder these. 01/26/2016 -- he has not yet done the x-ray of his foot and is vascular tests are still pending. I have asked him to work on these with his nursing home staff. Addendum: he has got an x-ray of the right foot done which shows that his osteopenia but no specific ostial lysis or abnormal periosteal reaction. Final impression was degenerative changes with dorsal foot soft tissue swelling. 02/16/2016 -- lower extremity arterial duplex examination shows a 50-99% stenosis of the right tibioperoneal trunk. He had biphasic flow in the right SFA, popliteal and tibioperoneal trunk. Left-sided he had triphasic flow throughout 02/29/2016 -- he is awaiting his vascular opinion with Dr. Gilda Crease which is to be done on April 24 03/08/2016 -- he has not kept his appointment with Dr. Gilda Crease on April 24 and does not seem to know what happened  about  this. it's difficult to gauge whether he is in full control of his mental faculties as at times he is extremely rude to the nursing staff. Electronic Signature(s) Signed: 03/08/2016 1:32:19 PM By: Evlyn Kanner MD, FACS Entered By: Evlyn Kanner on 03/08/2016 13:32:19 [name] ([id number]) [name], Tiney Rouge ([id number]) -------------------------------------------------------------------------------- Physical Exam Details Patient Name: [name] Date of Service: 03/08/2016 12:45 PM Medical Record Number: [id number] Patient Account Number: [id number] Date of Birth/Sex: 1946-07-10 (70 y.o. Male) Treating RN: Phillis Haggis Primary Care Physician: Oretha Milch Other Clinician: Referring Physician: Oretha Milch Treating Physician/Extender: Rudene Re in Treatment: 9 Constitutional . Pulse regular. Respirations normal and unlabored. Afebrile. . Eyes Nonicteric. Reactive to light. Ears, Nose, Mouth, and Throat Lips, teeth, and gums WNL.Marland Kitchen Moist mucosa without lesions. Neck supple and nontender. No palpable supraclavicular or cervical adenopathy. Normal sized without goiter. Respiratory WNL. No retractions.. Breath sounds WNL, No rubs, rales, rhonchi, or wheeze.. Cardiovascular Heart rhythm and rate regular, no murmur or gallop.. Pedal Pulses WNL. No clubbing, cyanosis or edema. Chest Breasts symmetical and no nipple discharge.. Breast tissue WNL, no masses, lumps, or tenderness.. Lymphatic No adneopathy. No adenopathy. No adenopathy. Musculoskeletal Adexa without tenderness or enlargement.. Digits and nails w/o clubbing, cyanosis, infection, petechiae, ischemia, or inflammatory conditions.. Integumentary (Hair, Skin) No suspicious lesions. No crepitus or fluctuance. No peri-wound warmth or erythema. No masses.Marland Kitchen Psychiatric Judgement and insight Intact.. No evidence of depression, anxiety, or agitation.. Notes the lymphedema is much better and the run very small  tiny open wounds with eschars on both lower extremities. The right heel continues to have a lot of tenderness and debridement is not possible today. He does have significant amount of slough in this area. Electronic Signature(s) Signed: 03/08/2016 1:33:01 PM By: Evlyn Kanner MD, FACS Entered By: Evlyn Kanner on 03/08/2016 13:33:01 [name] ([id number]) -------------------------------------------------------------------------------- Physician Orders Details Patient Name: [name] Date of Service: 03/08/2016 12:45 PM Medical Record Number: [id number] Patient Account Number: [id number] Date of Birth/Sex: 04-01-1946 (70 y.o. Male) Treating RN: [name] Primary Care Physician: Oretha Milch Other Clinician: Referring Physician: Oretha Milch Treating Physician/Extender: Rudene Re in Treatment: 9 Verbal / Phone Orders: Yes Clinician: [name] Read Back and Verified: Yes Diagnosis Coding Wound Cleansing Wound #1 Left Lower Leg o May shower with protection. - please cover wraps and keep dry o No tub bath. Wound #2 Right Lower Leg o May shower with protection. - please cover wraps and keep dry o No tub bath. Wound #3 Right Calcaneus o May shower with protection. - please cover wraps and keep dry o No tub bath. Wound #7 Midline Coccyx o May shower with protection. - please cover wraps and keep dry o No tub bath. Anesthetic Wound #1 Left Lower Leg o Topical Lidocaine 4% cream applied to wound bed prior to debridement Wound #2 Right Lower Leg o Topical Lidocaine 4% cream applied to wound bed prior to debridement Wound #3 Right Calcaneus o Topical Lidocaine 4% cream applied to wound bed prior to debridement Wound #7 Midline Coccyx o Topical Lidocaine 4% cream applied to wound bed prior to debridement Skin Barriers/Peri-Wound Care Wound #1 Left Lower Leg o Barrier cream Wound #2 Right Lower Leg o Barrier cream Gregori,  Alexiz V. ([id number]) Wound #3 Right Calcaneus o Barrier cream Wound #7 Midline Coccyx o Barrier cream Primary Wound Dressing Wound #1 Left Lower Leg o Non-adherent pad - ******IF THE PT MISSES HIS APPT FOR THE WEEK NURSING AT THE  FACILITY NEEDS TO CHANGE WRAPS AND DRESSINGS ON LEGS AND FOOT***** Wound #2 Right Lower Leg o Non-adherent pad - ******IF THE PT MISSES HIS APPT FOR THE WEEK NURSING AT THE FACILITY NEEDS TO CHANGE WRAPS AND DRESSINGS ON LEGS AND FOOT***** Wound #3 Right Calcaneus o Santyl Ointment - ******IF THE PT MISSES HIS APPT FOR THE WEEK NURSING AT THE FACILITY NEEDS TO CHANGE WRAPS AND DRESSINGS ON LEGS AND FOOT***** Wound #7 Midline Coccyx o Aquacel Ag - ******IF THE PT MISSES HIS APPT FOR THE WEEK NURSING AT THE FACILITY NEEDS TO CHANGE WRAPS AND DRESSINGS ON LEGS AND FOOT***** Secondary Dressing Wound #3 Right Calcaneus o ABD and Kerlix/Conform Wound #7 Midline Coccyx o Boardered Foam Dressing Dressing Change Frequency Wound #1 Left Lower Leg o Change dressing every week - in clinic ******IF THE PT MISSES HIS APPT FOR THE WEEK NURSING AT THE FACILITY NEEDS TO CHANGE WRAPS AND DRESSINGS ON LEGS AND FOOT***** Wound #2 Right Lower Leg o Change dressing every week - in clinic ******IF THE PT MISSES HIS APPT FOR THE WEEK NURSING AT THE FACILITY NEEDS TO CHANGE WRAPS AND DRESSINGS ON LEGS AND FOOT***** Wound #3 Right Calcaneus o Change dressing every day. - to be changed daily by SNF RN Wound #7 Midline Coccyx o Change dressing every day. - to be changed daily by SNF RN [name], Morse V. ([id number]) Follow-up Appointments Wound #1 Left Lower Leg o Return Appointment in 1 week. - ******PLEASE MAKE SURE PATIENT COMES TO HIS WEEKLY APPTS, HE HAS TO HAVE HIS WRAPS CHANGED.****** ******IF THE PT MISSES HIS APPT FOR THE WEEK NURSING AT THE FACILITY NEEDS TO CHANGE WRAPS AND DRESSINGS ON LEGS AND FOOT***** Wound #2 Right Lower Leg o  Return Appointment in 1 week. - ******PLEASE MAKE SURE PATIENT COMES TO HIS WEEKLY APPTS, HE HAS TO HAVE HIS WRAPS CHANGED.****** ******IF THE PT MISSES HIS APPT FOR THE WEEK NURSING AT THE FACILITY NEEDS TO CHANGE WRAPS AND DRESSINGS ON LEGS AND FOOT***** Wound #3 Right Calcaneus o Return Appointment in 1 week. - ******PLEASE MAKE SURE PATIENT COMES TO HIS WEEKLY APPTS, HE HAS TO HAVE HIS WRAPS CHANGED.****** ******IF THE PT MISSES HIS APPT FOR THE WEEK NURSING AT THE FACILITY NEEDS TO CHANGE WRAPS AND DRESSINGS ON LEGS AND FOOT***** Wound #7 Midline Coccyx o Return Appointment in 1 week. - ******PLEASE MAKE SURE PATIENT COMES TO HIS WEEKLY APPTS, HE HAS TO HAVE HIS WRAPS CHANGED.****** ******IF THE PT MISSES HIS APPT FOR THE WEEK NURSING AT THE FACILITY NEEDS TO CHANGE WRAPS AND DRESSINGS ON LEGS AND FOOT***** Edema Control Wound #1 Left Lower Leg o 3 Layer Compression System - Bilateral - unna to achor ******IF THE PT MISSES HIS APPT FOR THE WEEK NURSING AT THE FACILITY NEEDS TO CHANGE WRAPS AND DRESSINGS ON LEGS AND FOOT***** o Elevate legs to the level of the heart and pump ankles as often as possible - ******IF THE PT MISSES HIS APPT FOR THE WEEK NURSING AT THE FACILITY NEEDS TO CHANGE WRAPS AND DRESSINGS ON LEGS AND FOOT***** Wound #2 Right Lower Leg o 3 Layer Compression System - Bilateral - unna to achor ******IF THE PT MISSES HIS APPT FOR THE WEEK NURSING AT THE FACILITY NEEDS TO CHANGE WRAPS AND DRESSINGS ON LEGS AND FOOT***** o Elevate legs to the level of the heart and pump ankles as often as possible - ******IF THE PT MISSES HIS APPT FOR THE WEEK NURSING AT THE FACILITY NEEDS TO CHANGE WRAPS AND DRESSINGS ON LEGS AND  FOOT***** Wound #3 Right Calcaneus o 3 Layer Compression System - Bilateral - unna to achor ******IF THE PT MISSES HIS APPT FOR THE WEEK NURSING AT THE FACILITY NEEDS TO CHANGE WRAPS AND DRESSINGS ON LEGS AND FOOT***** o Elevate legs to the  level of the heart and pump ankles as often as possible - ******IF THE PT MISSES HIS APPT FOR THE WEEK NURSING AT THE FACILITY NEEDS TO CHANGE WRAPS AND DRESSINGS ON LEGS AND FOOT***** States, Ha V. ([id number]) Off-Loading Wound #1 Left Lower Leg o Turn and reposition every 2 hours o Other: - sage boots Wound #2 Right Lower Leg o Turn and reposition every 2 hours o Other: - sage boots Wound #3 Right Calcaneus o Turn and reposition every 2 hours o Other: - sage boots Wound #7 Midline Coccyx o Turn and reposition every 2 hours o Other: - sage boots Notes SNF to provide for next week compression hose 20-59mmHg compression for patient and also bilateral juzo compression wraps. Please send both to patient's next appointment Electronic Signature(s) Signed: 03/08/2016 4:23:46 PM By: Evlyn Kanner MD, FACS Signed: 03/08/2016 4:42:56 PM By: [name] Entered By: [name] on 03/08/2016 13:26:55 [name] ([id number]) -------------------------------------------------------------------------------- Problem List Details Patient Name: [name] Date of Service: 03/08/2016 12:45 PM Medical Record Number: [id number] Patient Account Number: [id number] Date of Birth/Sex: Sep 12, 1946 (70 y.o. Male) Treating RN: Phillis Haggis Primary Care Physician: Oretha Milch Other Clinician: Referring Physician: Oretha Milch Treating Physician/Extender: Rudene Re in Treatment: 9 Active Problems ICD-10 Encounter Code Description Active Date Diagnosis E11.621 Type 2 diabetes mellitus with foot ulcer 01/01/2016 Yes L89.613 Pressure ulcer of right heel, stage 3 01/01/2016 Yes E66.01 Morbid (severe) obesity due to excess calories 01/01/2016 Yes I89.0 Lymphedema, not elsewhere classified 01/01/2016 Yes M70.871 Other soft tissue disorders related to use, overuse and 01/19/2016 Yes pressure, right ankle and foot 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 Date of Service: 03/08/2016 12:45 PM Medical Record Number: [id number] Patient Account Number: [id number] Date of Birth/Sex: 01-Jul-1946 (70 y.o. Male) Treating RN: Phillis Haggis Primary Care Physician: Oretha Milch Other Clinician: Referring Physician: Oretha Milch Treating Physician/Extender: Rudene Re in Treatment: 9 Subjective Chief Complaint Information obtained from Patient Patient is at the clinic for treatment of an open pressure ulcer the left upper thigh and gluteal region and the right heel with bilateral swelling of the legs all of it which is going on for over a year History of Present Illness (HPI) The following HPI elements were documented for the patient's wound: Location: ulcerated area on the right heel, left gluteal region and thigh and then bilateral lower extremities Quality: Patient reports experiencing a sharp pain to affected area(s). Severity: Patient states wound are getting worse. Duration: Patient has had the wound for > 12 months prior to seeking treatment at the wound center Timing: Pain in wound is constant (hurts all the time) Context: The wound appeared gradually over time Modifying Factors: Other treatment(s) tried include: he sees his heart doctor and his primary care doctor Associated Signs and Symptoms: patient has not been able to walk for over a  year now 70 year old gentleman with a known history of hypertension,  diabetes, obstructive sleep apnea, COPD, diastolic CHF, coronary artery disease was admitted to the hospital with sepsis from an ulcer of the right heel and was treated there in October 2016. he also has chronic bilateral lower extremity edema and lymphedema. He had received vancomycin, Zosyn and at that stage and x-rays showed hardware in the right ankle but no evidence of osteomyelitis. he was a former smoker. he was also treated with Augmentin orally for 10 days. He is either bedbound or wheelchair-bound and does not ambulate by himself. 01/19/2016 -- he has not been seen here for 3 weeks and this was because he was admitted to [facility] between 223 and 01/08/2016 for sepsis, UTI and pneumonia involving the left lung. he was treated with IV vancomycin and Zosyn and then meropenem. Was discharged home on oral Bactrim for 2 weeks none of his vascular test or x-rays were done and we will reorder these. 01/26/2016 -- he has not yet done the x-ray of his foot and is vascular tests are still pending. I have asked him to work on these with his nursing home staff. Addendum: he has got an x-ray of the right foot done which shows that his osteopenia but no specific ostial lysis or abnormal periosteal reaction. Final impression was degenerative changes with dorsal foot soft tissue swelling. 02/16/2016 -- lower extremity arterial duplex examination shows a 50-99% stenosis of the right tibioperoneal trunk. He had biphasic flow in the right SFA, popliteal and tibioperoneal trunk. Left-sided he had triphasic flow throughout MANASES, ETCHISON ([id number]) 02/29/2016 -- he is awaiting his vascular opinion with Dr. Gilda Crease which is to be done on April 24 03/08/2016 -- he has not kept his appointment with Dr. Gilda Crease on April 24 and does not seem to know what happened about this. it's difficult to gauge whether he is in full control of his mental faculties as at times he is extremely  rude to the nursing staff. Objective Constitutional Pulse regular. Respirations normal and unlabored. Afebrile. Vitals Time Taken: 12:55 PM, Height: 70 in, Weight: 235 lbs, BMI: 33.7, Pulse: 53 bpm, Respiratory Rate: 18 breaths/min, Blood Pressure: 108/65 mmHg. Eyes Nonicteric. Reactive to light. Ears, Nose, Mouth, and Throat Lips, teeth, and gums WNL.Marland Kitchen Moist mucosa without lesions. Neck supple and nontender. No palpable supraclavicular or cervical adenopathy. Normal sized without goiter. Respiratory WNL. No retractions.. Breath sounds WNL, No rubs, rales, rhonchi, or wheeze.. Cardiovascular Heart rhythm and rate regular, no murmur or gallop.. Pedal Pulses WNL. No clubbing, cyanosis or edema. Chest Breasts symmetical and no nipple discharge.. Breast tissue WNL, no masses, lumps, or tenderness.. Lymphatic No adneopathy. No adenopathy. No adenopathy. Musculoskeletal Adexa without tenderness or enlargement.. Digits and nails w/o clubbing, cyanosis, infection, petechiae, ischemia, or inflammatory conditions.Marland Kitchen Psychiatric Judgement and insight Intact.. No evidence of depression, anxiety, or agitation.. General Notes: the lymphedema is much better and the run very small tiny open wounds with eschars on both lower extremities. The right heel continues to have a lot of tenderness and debridement is not possible Frommer, Amelia V. ([id number]) today. He does have significant amount of slough in this area. Integumentary (Hair, Skin) No suspicious lesions. No crepitus or fluctuance. No peri-wound warmth or erythema. No masses.. Wound #1 status is Open. Original cause of wound was Gradually Appeared. The wound is located on the Left Lower Leg. The wound measures 0.1cm length x 0.1cm width x 0.1cm depth; 0.008cm^2 area and 0.001cm^3 volume.  The wound is limited to skin breakdown. There is no tunneling or undermining noted. There is a large amount of serosanguineous drainage noted. The wound margin  is flat and intact. There is large (67-100%) pink granulation within the wound bed. There is no necrotic tissue within the wound bed. The periwound skin appearance exhibited: Localized Edema, Moist, Erythema. The surrounding wound skin color is noted with erythema which is circumferential. Periwound temperature was noted as No Abnormality. The periwound has tenderness on palpation. Wound #2 status is Open. Original cause of wound was Gradually Appeared. The wound is located on the Right Lower Leg. The wound measures 0.1cm length x 0.1cm width x 0.1cm depth; 0.008cm^2 area and 0.001cm^3 volume. The wound is limited to skin breakdown. There is no tunneling or undermining noted. There is a none present amount of drainage noted. The wound margin is flat and intact. There is large (67- 100%) red granulation within the wound bed. There is no necrotic tissue within the wound bed. The periwound skin appearance exhibited: Erythema. The periwound skin appearance did not exhibit: Localized Edema, Moist. The surrounding wound skin color is noted with erythema which is circumferential. Periwound temperature was noted as No Abnormality. The periwound has tenderness on palpation. Wound #3 status is Open. Original cause of wound was Pressure Injury. The wound is located on the Right Calcaneus. The wound measures 2.6cm length x 3.1cm width x 0.2cm depth; 6.33cm^2 area and 1.266cm^3 volume. The wound is limited to skin breakdown. There is no tunneling or undermining noted. There is a large amount of serous drainage noted. The wound margin is flat and intact. There is small (1-33%) red, pink granulation within the wound bed. There is a large (67-100%) amount of necrotic tissue within the wound bed including Eschar and Adherent Slough. The periwound skin appearance exhibited: Localized Edema, Maceration, Moist, Erythema. The surrounding wound skin color is noted with erythema which is circumferential. Periwound  temperature was noted as No Abnormality. The periwound has tenderness on palpation. Wound #6 status is Healed - Epithelialized. Original cause of wound was Pressure Injury. The wound is located on the Right,Lateral Foot. The wound measures 0cm length x 0cm width x 0cm depth; 0cm^2 area and 0cm^3 volume. The wound is limited to skin breakdown. There is no tunneling or undermining noted. There is a none present amount of drainage noted. The wound margin is flat and intact. There is no granulation within the wound bed. There is a large (67-100%) amount of necrotic tissue within the wound bed including Eschar and Adherent Slough. The periwound skin appearance exhibited: Moist. Periwound temperature was noted as No Abnormality. The periwound has tenderness on palpation. Wound #7 status is Open. Original cause of wound was Pressure Injury. The wound is located on the Midline Coccyx. The wound measures 4cm length x 3cm width x 0.1cm depth; 9.425cm^2 area and 0.942cm^3 volume. 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 - please cover wraps and keep dry No tub bath. Wound #2 Right Lower Leg: May shower with protection. - please cover wraps and  keep dry No tub bath. Wound #3 Right Calcaneus: May shower with protection. - please cover wraps and keep dry No tub bath. Wound #7 Midline Coccyx: May shower with protection. - please cover wraps and keep dry No tub bath. Anesthetic: Wound #1 Left Lower Leg: Topical  Lidocaine 4% cream applied to wound bed prior to debridement Wound #2 Right Lower Leg: Topical Lidocaine 4% cream applied to wound bed prior to debridement Wound #3 Right Calcaneus: Topical Lidocaine 4% cream applied to wound bed prior to debridement Wound #7 Midline Coccyx: Topical Lidocaine 4% cream applied to wound bed prior to debridement Skin Barriers/Peri-Wound Care: Wound #1 Left Lower Leg: Barrier cream Wound #2 Right Lower Leg: Barrier cream Wound #3 Right Calcaneus: Barrier cream Wound #7 Midline Coccyx: Barrier cream Primary Wound Dressing: Wound #1 Left Lower Leg: Shrider, Rontae V. ([id number]) Non-adherent pad - ******IF THE PT MISSES HIS APPT FOR THE WEEK NURSING AT THE FACILITY NEEDS TO CHANGE WRAPS AND DRESSINGS ON LEGS AND FOOT***** Wound #2 Right Lower Leg: Non-adherent pad - ******IF THE PT MISSES HIS APPT FOR THE WEEK NURSING AT THE FACILITY NEEDS TO CHANGE WRAPS AND DRESSINGS ON LEGS AND FOOT***** Wound #3 Right Calcaneus: Santyl Ointment - ******IF THE PT MISSES HIS APPT FOR THE WEEK NURSING AT THE FACILITY NEEDS TO CHANGE WRAPS AND DRESSINGS ON LEGS AND FOOT***** Wound #7 Midline Coccyx: Aquacel Ag - ******IF THE PT MISSES HIS APPT FOR THE WEEK NURSING AT THE FACILITY NEEDS TO CHANGE WRAPS AND DRESSINGS ON LEGS AND FOOT***** Secondary Dressing: Wound #3 Right Calcaneus: ABD and Kerlix/Conform Wound #7 Midline Coccyx: Boardered Foam Dressing Dressing Change Frequency: Wound #1 Left Lower Leg: Change dressing every week - in clinic ******IF THE PT MISSES HIS APPT FOR THE WEEK NURSING AT THE FACILITY NEEDS TO CHANGE WRAPS AND DRESSINGS ON LEGS AND FOOT***** Wound #2 Right Lower Leg: Change dressing every week - in clinic ******IF THE PT MISSES HIS APPT FOR THE WEEK NURSING AT THE FACILITY NEEDS TO CHANGE WRAPS AND DRESSINGS ON LEGS AND FOOT***** Wound #3 Right Calcaneus: Change dressing every day. - to be changed daily by SNF RN Wound #7 Midline  Coccyx: Change dressing every day. - to be changed daily by SNF RN Follow-up Appointments: Wound #1 Left Lower Leg: Return Appointment in 1 week. - ******PLEASE MAKE SURE PATIENT COMES TO HIS WEEKLY APPTS, HE HAS TO HAVE HIS WRAPS CHANGED.****** ******IF THE PT MISSES HIS APPT FOR THE WEEK NURSING AT THE FACILITY NEEDS TO CHANGE WRAPS AND DRESSINGS ON LEGS AND FOOT***** Wound #2 Right Lower Leg: Return Appointment in 1 week. - ******PLEASE MAKE SURE PATIENT COMES TO HIS WEEKLY APPTS, HE HAS TO HAVE HIS WRAPS CHANGED.****** ******IF THE PT MISSES HIS APPT FOR THE WEEK NURSING AT THE FACILITY NEEDS TO CHANGE WRAPS AND DRESSINGS ON LEGS AND FOOT***** Wound #3 Right Calcaneus: Return Appointment in 1 week. - ******PLEASE MAKE SURE PATIENT COMES TO HIS WEEKLY APPTS, HE HAS TO HAVE HIS WRAPS CHANGED.****** ******IF THE PT MISSES HIS APPT FOR THE WEEK NURSING AT THE FACILITY NEEDS TO CHANGE WRAPS AND DRESSINGS ON LEGS AND FOOT***** Wound #7 Midline Coccyx: Return Appointment in 1 week. - ******PLEASE MAKE SURE PATIENT COMES TO HIS WEEKLY APPTS, HE HAS TO HAVE HIS WRAPS CHANGED.****** ******IF THE PT MISSES HIS APPT FOR THE WEEK NURSING AT THE FACILITY NEEDS TO CHANGE WRAPS AND DRESSINGS ON LEGS AND FOOT***** Edema Control: Wound #1 Left Lower Leg: 3 Layer Compression System - Bilateral - unna  to achor ******IF THE PT MISSES HIS APPT FOR THE Broadnax, Shamarr V. ([id number]) WEEK NURSING AT THE FACILITY NEEDS TO CHANGE WRAPS AND DRESSINGS ON LEGS AND FOOT***** Elevate legs to the level of the heart and pump ankles as often as possible - ******IF THE PT MISSES HIS APPT FOR THE WEEK NURSING AT THE FACILITY NEEDS TO CHANGE WRAPS AND DRESSINGS ON LEGS AND FOOT***** Wound #2 Right Lower Leg: 3 Layer Compression System - Bilateral - unna to achor ******IF THE PT MISSES HIS APPT FOR THE WEEK NURSING AT THE FACILITY NEEDS TO CHANGE WRAPS AND DRESSINGS ON LEGS AND FOOT***** Elevate legs to the level  of the heart and pump ankles as often as possible - ******IF THE PT MISSES HIS APPT FOR THE WEEK NURSING AT THE FACILITY NEEDS TO CHANGE WRAPS AND DRESSINGS ON LEGS AND FOOT***** Wound #3 Right Calcaneus: 3 Layer Compression System - Bilateral - unna to achor ******IF THE PT MISSES HIS APPT FOR THE WEEK NURSING AT THE FACILITY NEEDS TO CHANGE WRAPS AND DRESSINGS ON LEGS AND FOOT***** Elevate legs to the level of the heart and pump ankles as often as possible - ******IF THE PT MISSES HIS APPT FOR THE WEEK NURSING AT THE FACILITY NEEDS TO CHANGE WRAPS AND DRESSINGS ON LEGS AND FOOT***** Off-Loading: Wound #1 Left Lower Leg: Turn and reposition every 2 hours Other: - sage boots Wound #2 Right Lower Leg: Turn and reposition every 2 hours Other: - sage boots Wound #3 Right Calcaneus: Turn and reposition every 2 hours Other: - sage boots Wound #7 Midline Coccyx: Turn and reposition every 2 hours Other: - sage boots General Notes: SNF to provide for next week compression hose 20-87mmHg compression for patient and also bilateral juzo compression wraps. Please send both to patient's next appointment I have recommended: 1. Santyl ointment locally to his right heel.the patient is not able to tolerate any debridement today as he is very tender 2. Constant off loading and also using Sage boot. 3. reschedule his vascular consult with Dr. Gilda Crease. he missed the appointment on April 24 4. bilateral lower extremity Profore lites which he has been using before. we have recommended juxta light stockings for him as he is going to be able to discontinue his compression wraps and use compression stockings 5. High-protein diet and multivitamins including vitamin C and zinc. CARY, LOTHROP ([id number]) Electronic Signature(s) Signed: 03/08/2016 1:34:38 PM By: Evlyn Kanner MD, FACS Entered By: Evlyn Kanner on 03/08/2016 13:34:38 [name], Dio Seth Bake  ([id number]) -------------------------------------------------------------------------------- SuperBill Details Patient Name: [name] Date of Service: 03/08/2016 Medical Record Number: [id number] Patient Account Number: [id number] Date of Birth/Sex: Apr 09, 1946 (70 y.o. 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

## 2016-03-12 ENCOUNTER — Encounter
Admission: RE | Disposition: A | Discharge status: Other Acute Inpatient Hospital | Payer: Self-pay | Source: Ambulatory Visit | Attending: Vascular Surgery

## 2016-03-12 ENCOUNTER — Encounter: Payer: Self-pay | Admitting: *Deleted

## 2016-03-12 ENCOUNTER — Inpatient Hospital Stay
Admission: RE | Admit: 2016-03-12 | Discharge: 2016-03-15 | DRG: 270 | Payer: Medicare Other | Source: Ambulatory Visit | Attending: Vascular Surgery | Admitting: Vascular Surgery

## 2016-03-12 DIAGNOSIS — R410 Disorientation, unspecified: Secondary | ICD-10-CM

## 2016-03-12 DIAGNOSIS — I2510 Atherosclerotic heart disease of native coronary artery without angina pectoris: Secondary | ICD-10-CM | POA: Diagnosis present

## 2016-03-12 DIAGNOSIS — Z794 Long term (current) use of insulin: Secondary | ICD-10-CM | POA: Diagnosis not present

## 2016-03-12 DIAGNOSIS — K219 Gastro-esophageal reflux disease without esophagitis: Secondary | ICD-10-CM | POA: Diagnosis present

## 2016-03-12 DIAGNOSIS — Z955 Presence of coronary angioplasty implant and graft: Secondary | ICD-10-CM | POA: Diagnosis not present

## 2016-03-12 DIAGNOSIS — F0390 Unspecified dementia without behavioral disturbance: Secondary | ICD-10-CM | POA: Diagnosis present

## 2016-03-12 DIAGNOSIS — L89153 Pressure ulcer of sacral region, stage 3: Secondary | ICD-10-CM | POA: Diagnosis present

## 2016-03-12 DIAGNOSIS — E1122 Type 2 diabetes mellitus with diabetic chronic kidney disease: Secondary | ICD-10-CM | POA: Diagnosis present

## 2016-03-12 DIAGNOSIS — Z820 Family history of epilepsy and other diseases of the nervous system: Secondary | ICD-10-CM

## 2016-03-12 DIAGNOSIS — Z9049 Acquired absence of other specified parts of digestive tract: Secondary | ICD-10-CM | POA: Diagnosis not present

## 2016-03-12 DIAGNOSIS — L8990 Pressure ulcer of unspecified site, unspecified stage: Secondary | ICD-10-CM | POA: Insufficient documentation

## 2016-03-12 DIAGNOSIS — Z9981 Dependence on supplemental oxygen: Secondary | ICD-10-CM

## 2016-03-12 DIAGNOSIS — N189 Chronic kidney disease, unspecified: Secondary | ICD-10-CM | POA: Diagnosis present

## 2016-03-12 DIAGNOSIS — Z7401 Bed confinement status: Secondary | ICD-10-CM

## 2016-03-12 DIAGNOSIS — I509 Heart failure, unspecified: Secondary | ICD-10-CM | POA: Diagnosis present

## 2016-03-12 DIAGNOSIS — N289 Disorder of kidney and ureter, unspecified: Secondary | ICD-10-CM

## 2016-03-12 DIAGNOSIS — Z9889 Other specified postprocedural states: Secondary | ICD-10-CM | POA: Diagnosis not present

## 2016-03-12 DIAGNOSIS — Z7902 Long term (current) use of antithrombotics/antiplatelets: Secondary | ICD-10-CM | POA: Diagnosis not present

## 2016-03-12 DIAGNOSIS — J449 Chronic obstructive pulmonary disease, unspecified: Secondary | ICD-10-CM | POA: Diagnosis present

## 2016-03-12 DIAGNOSIS — Z8249 Family history of ischemic heart disease and other diseases of the circulatory system: Secondary | ICD-10-CM | POA: Diagnosis not present

## 2016-03-12 DIAGNOSIS — I130 Hypertensive heart and chronic kidney disease with heart failure and stage 1 through stage 4 chronic kidney disease, or unspecified chronic kidney disease: Secondary | ICD-10-CM | POA: Diagnosis present

## 2016-03-12 DIAGNOSIS — Z951 Presence of aortocoronary bypass graft: Secondary | ICD-10-CM

## 2016-03-12 DIAGNOSIS — Z833 Family history of diabetes mellitus: Secondary | ICD-10-CM

## 2016-03-12 DIAGNOSIS — Z87891 Personal history of nicotine dependence: Secondary | ICD-10-CM | POA: Diagnosis not present

## 2016-03-12 DIAGNOSIS — L97419 Non-pressure chronic ulcer of right heel and midfoot with unspecified severity: Secondary | ICD-10-CM | POA: Diagnosis present

## 2016-03-12 DIAGNOSIS — I959 Hypotension, unspecified: Secondary | ICD-10-CM | POA: Diagnosis present

## 2016-03-12 DIAGNOSIS — M109 Gout, unspecified: Secondary | ICD-10-CM | POA: Diagnosis present

## 2016-03-12 DIAGNOSIS — J9610 Chronic respiratory failure, unspecified whether with hypoxia or hypercapnia: Secondary | ICD-10-CM | POA: Diagnosis present

## 2016-03-12 DIAGNOSIS — G4730 Sleep apnea, unspecified: Secondary | ICD-10-CM | POA: Diagnosis present

## 2016-03-12 DIAGNOSIS — I70238 Atherosclerosis of native arteries of right leg with ulceration of other part of lower right leg: Secondary | ICD-10-CM | POA: Diagnosis present

## 2016-03-12 DIAGNOSIS — S301XXA Contusion of abdominal wall, initial encounter: Secondary | ICD-10-CM | POA: Diagnosis not present

## 2016-03-12 DIAGNOSIS — Z79899 Other long term (current) drug therapy: Secondary | ICD-10-CM | POA: Diagnosis not present

## 2016-03-12 HISTORY — PX: PERIPHERAL VASCULAR CATHETERIZATION: SHX172C

## 2016-03-12 LAB — GLUCOSE, CAPILLARY
GLUCOSE-CAPILLARY: 80 mg/dL (ref 65–99)
GLUCOSE-CAPILLARY: 76 mg/dL (ref 65–99)

## 2016-03-12 LAB — CBC
HCT: 38.4 % — ABNORMAL LOW (ref 40.0–52.0)
HEMOGLOBIN: 12.9 g/dL — AB (ref 13.0–18.0)
MCH: 30 pg (ref 26.0–34.0)
MCHC: 33.6 g/dL (ref 32.0–36.0)
MCV: 89.1 fL (ref 80.0–100.0)
PLATELETS: 163 10*3/uL (ref 150–440)
RBC: 4.32 MIL/uL — AB (ref 4.40–5.90)
RDW: 14.8 % — ABNORMAL HIGH (ref 11.5–14.5)
WBC: 9.2 10*3/uL (ref 3.8–10.6)

## 2016-03-12 SURGERY — LOWER EXTREMITY ANGIOGRAPHY
Anesthesia: Moderate Sedation | Laterality: Right

## 2016-03-12 MED ORDER — DIVALPROEX SODIUM 250 MG PO DR TAB
250.0000 mg | DELAYED_RELEASE_TABLET | Freq: Two times a day (BID) | ORAL | Status: DC
  Administered 2016-03-13 – 2016-03-15 (×6): 250 mg via ORAL
  Filled 2016-03-12 (×10): qty 1

## 2016-03-12 MED ORDER — LOPERAMIDE HCL 2 MG PO CAPS: 2.0000 mg | ORAL_CAPSULE | ORAL | Status: DC | PRN

## 2016-03-12 MED ORDER — FINASTERIDE 5 MG PO TABS
5.0000 mg | ORAL_TABLET | Freq: Every day | ORAL | Status: DC
  Administered 2016-03-13 – 2016-03-15 (×3): 5 mg via ORAL
  Filled 2016-03-12 (×3): qty 1

## 2016-03-12 MED ORDER — OXYCODONE HCL 5 MG PO TABS
5.0000 mg | ORAL_TABLET | ORAL | Status: DC | PRN
  Administered 2016-03-13: 5 mg via ORAL
  Filled 2016-03-12: qty 1

## 2016-03-12 MED ORDER — FENTANYL CITRATE (PF) 100 MCG/2ML IJ SOLN
INTRAMUSCULAR | Status: AC
  Filled 2016-03-12: qty 2

## 2016-03-12 MED ORDER — METHYLPREDNISOLONE SODIUM SUCC 125 MG IJ SOLR: 125.0000 mg | INTRAMUSCULAR | Status: DC | PRN

## 2016-03-12 MED ORDER — SODIUM CHLORIDE 0.9 % IV SOLN: INTRAVENOUS | Status: DC

## 2016-03-12 MED ORDER — HYDROMORPHONE HCL 1 MG/ML IJ SOLN: 1.0000 mg | Freq: Once | INTRAMUSCULAR | Status: DC

## 2016-03-12 MED ORDER — ALLOPURINOL 100 MG PO TABS
100.0000 mg | ORAL_TABLET | Freq: Every day | ORAL | Status: DC
  Administered 2016-03-12 – 2016-03-14 (×3): 100 mg via ORAL
  Filled 2016-03-12 (×3): qty 1

## 2016-03-12 MED ORDER — DOPAMINE-DEXTROSE 3.2-5 MG/ML-% IV SOLN
INTRAVENOUS | Status: AC
  Filled 2016-03-12: qty 250

## 2016-03-12 MED ORDER — OXYCODONE HCL 5 MG PO TABS: 10.0000 mg | ORAL_TABLET | Freq: Three times a day (TID) | ORAL | Status: DC | PRN

## 2016-03-12 MED ORDER — FUROSEMIDE 40 MG PO TABS
60.0000 mg | ORAL_TABLET | Freq: Two times a day (BID) | ORAL | Status: DC
Start: 2016-03-12 — End: 2016-03-14
  Administered 2016-03-12 – 2016-03-13 (×3): 60 mg via ORAL
  Filled 2016-03-12: qty 1
  Filled 2016-03-12: qty 2
  Filled 2016-03-12: qty 1
  Filled 2016-03-12 (×2): qty 2

## 2016-03-12 MED ORDER — MIDAZOLAM HCL 2 MG/2ML IJ SOLN
INTRAMUSCULAR | Status: AC
  Filled 2016-03-12: qty 4

## 2016-03-12 MED ORDER — SERTRALINE HCL 100 MG PO TABS
100.0000 mg | ORAL_TABLET | Freq: Every day | ORAL | Status: DC
  Administered 2016-03-13 – 2016-03-15 (×3): 100 mg via ORAL
  Filled 2016-03-12 (×4): qty 1

## 2016-03-12 MED ORDER — SALICYLIC ACID 3 % EX SHAM: 1.0000 "application " | MEDICATED_SHAMPOO | CUTANEOUS | Status: DC

## 2016-03-12 MED ORDER — FLUCONAZOLE 100 MG PO TABS
150.0000 mg | ORAL_TABLET | ORAL | Status: DC
  Administered 2016-03-14: 150 mg via ORAL
  Filled 2016-03-12: qty 2

## 2016-03-12 MED ORDER — IOPAMIDOL (ISOVUE-300) INJECTION 61%
INTRAVENOUS | Status: DC | PRN
  Administered 2016-03-12: 105 mL via INTRAVENOUS

## 2016-03-12 MED ORDER — HEPARIN (PORCINE) IN NACL 2-0.9 UNIT/ML-% IJ SOLN
INTRAMUSCULAR | Status: AC
Start: 2016-03-12 — End: 2016-03-12
  Filled 2016-03-12: qty 1000

## 2016-03-12 MED ORDER — TRAZODONE HCL 50 MG PO TABS
50.0000 mg | ORAL_TABLET | Freq: Every day | ORAL | Status: DC
  Administered 2016-03-12 – 2016-03-14 (×3): 50 mg via ORAL
  Filled 2016-03-12 (×3): qty 1

## 2016-03-12 MED ORDER — ONDANSETRON HCL 4 MG/2ML IJ SOLN: 4.0000 mg | Freq: Four times a day (QID) | INTRAMUSCULAR | Status: DC | PRN

## 2016-03-12 MED ORDER — HEPARIN SODIUM (PORCINE) 1000 UNIT/ML IJ SOLN
INTRAMUSCULAR | Status: DC | PRN
  Administered 2016-03-12: 2000 [IU] via INTRAVENOUS
  Administered 2016-03-12: 5000 [IU] via INTRAVENOUS

## 2016-03-12 MED ORDER — METOPROLOL TARTRATE 50 MG PO TABS
50.0000 mg | ORAL_TABLET | Freq: Every day | ORAL | Status: DC
  Administered 2016-03-13 – 2016-03-15 (×2): 50 mg via ORAL
  Filled 2016-03-12 (×3): qty 1

## 2016-03-12 MED ORDER — COLLAGENASE 250 UNIT/GM EX OINT
1.0000 "application " | TOPICAL_OINTMENT | Freq: Every day | CUTANEOUS | Status: DC
  Administered 2016-03-14 – 2016-03-15 (×2): 1 via TOPICAL
  Filled 2016-03-12: qty 30

## 2016-03-12 MED ORDER — FAMOTIDINE 20 MG PO TABS: 40.0000 mg | ORAL_TABLET | ORAL | Status: DC | PRN

## 2016-03-12 MED ORDER — GUAIFENESIN ER 600 MG PO TB12
600.0000 mg | ORAL_TABLET | Freq: Two times a day (BID) | ORAL | Status: DC
  Administered 2016-03-12 – 2016-03-15 (×6): 600 mg via ORAL
  Filled 2016-03-12 (×7): qty 1

## 2016-03-12 MED ORDER — FENTANYL CITRATE (PF) 100 MCG/2ML IJ SOLN
INTRAMUSCULAR | Status: DC | PRN
  Administered 2016-03-12 (×6): 50 ug via INTRAVENOUS

## 2016-03-12 MED ORDER — MORPHINE SULFATE (PF) 4 MG/ML IV SOLN
2.0000 mg | INTRAVENOUS | Status: DC | PRN
  Administered 2016-03-12: 4 mg via INTRAVENOUS
  Filled 2016-03-12: qty 1

## 2016-03-12 MED ORDER — ALUM & MAG HYDROXIDE-SIMETH 200-200-20 MG/5ML PO SUSP: 15.0000 mL | ORAL | Status: DC | PRN

## 2016-03-12 MED ORDER — PANTOPRAZOLE SODIUM 40 MG PO TBEC
40.0000 mg | DELAYED_RELEASE_TABLET | Freq: Every day | ORAL | Status: DC
  Administered 2016-03-13 – 2016-03-15 (×3): 40 mg via ORAL
  Filled 2016-03-12 (×3): qty 1

## 2016-03-12 MED ORDER — SODIUM CHLORIDE 0.9 % IV SOLN
INTRAVENOUS | Status: AC
Start: 2016-03-12 — End: 2016-03-13

## 2016-03-12 MED ORDER — LIDOCAINE HCL (PF) 1 % IJ SOLN
INTRAMUSCULAR | Status: AC
  Filled 2016-03-12: qty 30

## 2016-03-12 MED ORDER — HEPARIN SODIUM (PORCINE) 1000 UNIT/ML IJ SOLN
INTRAMUSCULAR | Status: AC
  Filled 2016-03-12: qty 1

## 2016-03-12 MED ORDER — DEXTROSE 5 % IV SOLN
1.5000 g | INTRAVENOUS | Status: AC
  Administered 2016-03-12: 1.5 g via INTRAVENOUS

## 2016-03-12 MED ORDER — TAMSULOSIN HCL 0.4 MG PO CAPS
0.4000 mg | ORAL_CAPSULE | Freq: Every day | ORAL | Status: DC
  Administered 2016-03-13 – 2016-03-15 (×3): 0.4 mg via ORAL
  Filled 2016-03-12 (×3): qty 1

## 2016-03-12 MED ORDER — DOPAMINE-DEXTROSE 3.2-5 MG/ML-% IV SOLN
0.0000 ug/kg/min | INTRAVENOUS | Status: DC
  Administered 2016-03-12: 8 ug/kg/min via INTRAVENOUS
  Administered 2016-03-13: 3 ug/kg/min via INTRAVENOUS

## 2016-03-12 MED ORDER — "AQUACEL AG FOAM 4""X4"" EX PADS": 1.0000 "application " | MEDICATED_PAD | Freq: Every day | CUTANEOUS | Status: DC

## 2016-03-12 MED ORDER — SODIUM CHLORIDE 0.9 % IV SOLN
INTRAVENOUS | Status: DC
  Administered 2016-03-12: 13:00:00 via INTRAVENOUS

## 2016-03-12 MED ORDER — DEXTROSE 5 % IV SOLN
INTRAVENOUS | Status: AC
  Filled 2016-03-12: qty 1.5

## 2016-03-12 MED ORDER — LORATADINE 10 MG PO TABS
10.0000 mg | ORAL_TABLET | Freq: Every day | ORAL | Status: DC
  Administered 2016-03-13 – 2016-03-15 (×3): 10 mg via ORAL
  Filled 2016-03-12 (×3): qty 1

## 2016-03-12 MED ORDER — GABAPENTIN 400 MG PO CAPS
400.0000 mg | ORAL_CAPSULE | Freq: Three times a day (TID) | ORAL | Status: DC
  Administered 2016-03-12 – 2016-03-15 (×8): 400 mg via ORAL
  Filled 2016-03-12 (×9): qty 1

## 2016-03-12 MED ORDER — MIDAZOLAM HCL 2 MG/2ML IJ SOLN
INTRAMUSCULAR | Status: DC | PRN
  Administered 2016-03-12 (×6): 1 mg via INTRAVENOUS

## 2016-03-12 MED ORDER — ACETAMINOPHEN 500 MG PO TABS: 500.0000 mg | ORAL_TABLET | Freq: Four times a day (QID) | ORAL | Status: DC | PRN

## 2016-03-12 MED ORDER — DOPAMINE-DEXTROSE 3.2-5 MG/ML-% IV SOLN
INTRAVENOUS | Status: AC
  Administered 2016-03-12: 8 ug/kg/min via INTRAVENOUS
  Filled 2016-03-12: qty 250

## 2016-03-12 MED ORDER — IPRATROPIUM-ALBUTEROL 0.5-2.5 (3) MG/3ML IN SOLN
3.0000 mL | Freq: Four times a day (QID) | RESPIRATORY_TRACT | Status: DC
  Administered 2016-03-12 – 2016-03-13 (×2): 3 mL via RESPIRATORY_TRACT
  Filled 2016-03-12 (×4): qty 3

## 2016-03-12 MED ORDER — MAGNESIUM HYDROXIDE 400 MG/5ML PO SUSP: 30.0000 mL | Freq: Every day | ORAL | Status: DC | PRN

## 2016-03-12 MED ORDER — DEXTROMETHORPHAN POLISTIREX ER 30 MG/5ML PO SUER
60.0000 mg | Freq: Two times a day (BID) | ORAL | Status: DC | PRN
  Filled 2016-03-12 (×2): qty 10

## 2016-03-12 MED ORDER — LIDOCAINE VISCOUS 2 % MT SOLN
OROMUCOSAL | Status: AC
  Filled 2016-03-12: qty 15

## 2016-03-12 MED ORDER — ACETAMINOPHEN 325 MG RE SUPP
325.0000 mg | RECTAL | Status: DC | PRN
  Filled 2016-03-12: qty 2

## 2016-03-12 MED ORDER — ACETAMINOPHEN 325 MG PO TABS: 325.0000 mg | ORAL_TABLET | ORAL | Status: DC | PRN

## 2016-03-12 MED ORDER — VITAMIN D 1000 UNITS PO TABS
1000.0000 [IU] | ORAL_TABLET | Freq: Every day | ORAL | Status: DC
  Administered 2016-03-13 – 2016-03-15 (×3): 1000 [IU] via ORAL
  Filled 2016-03-12 (×3): qty 1

## 2016-03-12 MED ORDER — MIDAZOLAM HCL 2 MG/2ML IJ SOLN
INTRAMUSCULAR | Status: AC
  Filled 2016-03-12: qty 2

## 2016-03-12 SURGICAL SUPPLY — 32 items
BALLN LUTONIX DCB 5X60X130 (BALLOONS) ×4
BALLN ULTRVRSE 2.5X300X150 (BALLOONS) ×4
BALLN ULTRVRSE 3X80X150 (BALLOONS) ×2
BALLN ULTRVRSE 3X80X150 OTW (BALLOONS) ×2
BALLOON LUTONIX DCB 5X60X130 (BALLOONS) ×2 IMPLANT
BALLOON ULTRVRSE 2.5X300X150 (BALLOONS) ×2 IMPLANT
BALLOON ULTRVRSE 3X80X150 OTW (BALLOONS) ×2 IMPLANT
CATH CROSSER S6 154CM (CATHETERS) ×4 IMPLANT
CATH CXI SUPP ANG 2.6FR 150CM (MICROCATHETER) ×4 IMPLANT
CATH G STR 4FX120.038 (CATHETERS) ×4 IMPLANT
CATH PIG 70CM (CATHETERS) ×4 IMPLANT
CATH SEEKER .035X150CM (CATHETERS) ×4 IMPLANT
CATH USHER ANGLED 130CM (CATHETERS) ×4 IMPLANT
CATH VERT 100CM (CATHETERS) ×4 IMPLANT
DEVICE PRESTO INFLATION (MISCELLANEOUS) ×4 IMPLANT
DEVICE STARCLOSE SE CLOSURE (Vascular Products) ×4 IMPLANT
DEVICE TORQUE (MISCELLANEOUS) ×4 IMPLANT
GLIDECATH ANGLED 4FR 120CM (CATHETERS) ×4 IMPLANT
GLIDEWIRE ANGLED SS 035X260CM (WIRE) ×4 IMPLANT
GUIDEWIRE PFTE-COATED .018X300 (WIRE) ×4 IMPLANT
GUIDEWIRE SUPER STIFF .035X180 (WIRE) ×4 IMPLANT
KIT FLOWMATE PROCEDURAL (MISCELLANEOUS) ×4 IMPLANT
PACK ANGIOGRAPHY (CUSTOM PROCEDURE TRAY) ×4 IMPLANT
SET INTRO CAPELLA COAXIAL (SET/KITS/TRAYS/PACK) ×4 IMPLANT
SHEATH BRITE TIP 5FRX11 (SHEATH) ×4 IMPLANT
SHEATH BRITE TIP 6FR X 23 (SHEATH) ×4 IMPLANT
SHEATH RAABE 6FR (SHEATH) ×4 IMPLANT
SYR MEDRAD MARK V 150ML (SYRINGE) ×4 IMPLANT
TUBING CONTRAST HIGH PRESS 72 (TUBING) ×4 IMPLANT
VALVE HEMO TOUHY BORST Y (VALVE) ×4 IMPLANT
WIRE G V18X300CM (WIRE) ×4 IMPLANT
WIRE J 3MM .035X145CM (WIRE) ×4 IMPLANT

## 2016-03-12 NOTE — H&P (Signed)
[facility] History & Physical Update  The patient was interviewed and re-examined.  The patient's previous History and Physical has been reviewed and is unchanged.  There is no change in the plan of care. We plan to proceed with the scheduled procedure.  Schnier, Latina CraverGregory G, MD  03/12/2016, 1:33 PM

## 2016-03-12 NOTE — OR Nursing (Signed)
After closure device placed and pressure held, notified that hematoma identified, SBP started dropping into 70's NS fluid bolus started, Dr Gilda CreaseSchnier returned and held pressure. Dopamine infusion started at 8 mcg/kg/min, Foley catheter placed. After 20 minutes BP stablized and pt transferred to specials recovery, 60 ml inflated PAD placed left groin. mulitple skin tears and bruising noted/

## 2016-03-12 NOTE — OR Nursing (Signed)
Oxygen saturations decreased to low 80's, stilumlated and jaw thrust, oxygen increased to 6 liters, minimal improvement, placed on CPAP with 12 liter oxygen flow oxygen saturations increaed to upper 80's low 90's/

## 2016-03-12 NOTE — Progress Notes (Signed)
[facility]  Daily Progress Note   Subjective  - Day of Surgery  Patient requesting to sit up he denies groin pain is complaining of some back pain in the lumbar area  Objective Filed Vitals:   03/12/16 1745 03/12/16 1900 03/12/16 1957 03/12/16 2000  BP: 119/74 126/70  122/64  Pulse: 102 78  78  Temp:  97.6 F (36.4 C)    TempSrc:  Oral    Resp: 19 14  17   Height:  5\' 11"  (1.803 m)    Weight:  121.8 kg (268 lb 8.3 oz)    SpO2: 93% 96% 100% 99%    Intake/Output Summary (Last 24 hours) at 03/12/16 2136 Last data filed at 03/12/16 2100  Gross per 24 hour  Intake  387.4 ml  Output    550 ml  Net -162.6 ml    PULM  Normal effort , no use of accessory muscles CV  No JVD, RRR Abd      No distended, nontender VASC  Groin is soft  Laboratory CBC    Component Value Date/Time   WBC 9.2 03/12/2016 1903   WBC 5.2 11/26/2014 0459   HGB 12.9* 03/12/2016 1903   HGB 9.4* 11/26/2014 0459   HCT 38.4* 03/12/2016 1903   HCT 29.3* 11/26/2014 0459   PLT 163 03/12/2016 1903   PLT 116* 11/26/2014 0459    BMET    Component Value Date/Time   NA 141 01/07/2016 0612   NA 144 11/26/2014 0459   K 4.5 01/07/2016 0612   K 3.8 11/26/2014 0459   CL 107 01/07/2016 0612   CL 106 11/26/2014 0459   CO2 30 01/07/2016 0612   CO2 33* 11/26/2014 0459   GLUCOSE 101* 01/07/2016 0612   GLUCOSE 108* 11/26/2014 0459   BUN 42* 01/07/2016 0612   BUN 13 11/26/2014 0459   CREATININE 1.61* 01/07/2016 0612   CREATININE 1.08 11/26/2014 0459   CALCIUM 8.2* 01/07/2016 0612   CALCIUM 8.6 11/26/2014 0459   GFRNONAA 41* 01/07/2016 0612   GFRNONAA >60 11/26/2014 0459   GFRAA 48* 01/07/2016 0612   GFRAA >60 11/26/2014 0459    Assessment/Planning:  POD Night of surgery  His urine output is low over the past 2 hours however his IV fluids are at 150 cc per hour and will continue as such and his dopamine has been decreased down to 4 mcg/kg/m which is at renal dose and therefore I  will continue this aggressive hydration and the low-dose dopamine and we will monitor his urine output.    Renford DillsSchnier, Gregory G  03/12/2016, 9:36 PM

## 2016-03-12 NOTE — Op Note (Signed)
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 Introduction catheter into right lower extremity 3rd order catheter placement  2. Contrast injection right lower extremity for distal runoff  3. Percutaneous transluminal angioplasty right superficial femoral and popliteal arteries to 5 mm with Lutonix 4. Crosser atherectomy right peroneal with percutaneous transluminal angioplasty right perineal to 3 mm  5. Star close closure left common femoral arteriotomy  Anesthesia: Conscious sedation was administered under my direct supervision. IV Versed plus fentanyl were utilized. Continuous ECG, pulse oximetry and blood pressure was monitored throughout the entire procedure. Versed and Fentanyl were utilized. Conscious sedation was for a total of 3 hours  Sheath: 6 French Ansel left common femoral  Contrast: 105 cc  Fluoroscopy Time: 28.7 minutes  Indications: [name] presents with increasing pain and nonhealing ulceration of the right lower extremity. The patient is a risk for limb loss as he is having limb threatening ischemia. The risks and benefits are reviewed for intervention with angiography. Alternative therapies were discussed. All questions answered patient agrees to proceed.  Procedure:[name] is a 70 y.o. y.o. male who was identified and appropriate procedural time out was performed. The patient was then placed supine on the table and prepped and draped in the usual sterile fashion.   Ultrasound was placed in the sterile sleeve and the left groin was evaluated the left common femoral artery was echolucent and  pulsatile indicating patency. Image was recorded for the permanent record and under real-time visualization a microneedle was inserted into the common femoral artery microwire followed by a micro-sheath. A J-wire was then advanced through the micro-sheath and a 5 [place] sheath was then inserted over a J-wire. J-wire was then advanced and a 5 French pigtail catheter was positioned at the level of T12.  AP projection of the aorta was then obtained. Pigtail catheter was repositioned to above the bifurcation and a LAO view of the pelvis was obtained. Subsequently a pigtail catheter with the stiff angle Glidewire was used to cross the aortic bifurcation the catheter wire were advanced down into the right distal external iliac artery. Oblique view of the femoral bifurcation was then obtained and subsequently the wire was reintroduced and the pigtail catheter negotiated into the SFA representing third order catheter placement. Distal runoff was then performed.  5000 units of heparin was then given and allowed to circulate and a 6 [place] rabies sheath was advanced up and over the bifurcation and positioned in the proximal superficial femoral artery  Straight catheter and stiff angle Glidewire were then negotiated down into the distal popliteal so that better images of the tibial vessels and the trifurcation could be obtained. Catheter was then advanced. Hand injection contrast demonstrated the tibial anatomy in detail.  The wire and the catheter was then negotiated into the peroneal and a Crosser S6 atherectomy catheter was  advanced across the occluded segment.  Multiple passes were made with the S6 which did cross the lesion but did not reenter the true lumen.  Subsequently, a variety of different wires and catheters were then used and with magnified images in various planes the wire was negotiated into the true lumen and hand injection contrast was used to verify proper catheter positioning. A 2.5 mm x 30 cm  balloon was used to angioplasty  the peroneal. Adequate inflow was not achieved and therefore a 3 x 8 balloon was negotiated across the occlusion.  Each inflation was for 2 minutes at 12 atm. Follow-up imaging demonstrated excellent patency with preservation of the distal runoff.  The detector was then repositioned and the SFA and popliteal was reimaged 80% stenosis is noted in this area and after appropriate measurements are made a 5 x 60 Lutonix balloon was used to angioplasty the distal superficial femoral and proximal popliteal arteries. Inflation were to 12 atmospheres for 2 minutes. Follow-up imaging demonstrated patency with excellent result. Distal runoff was then reassessed and noted to be widely patent.   After review of these images the sheath is pulled into the left external iliac oblique of the common femoral is obtained and a Star close device deployed. Hematoma was encountered the patient did become hypotensive and therefore he was given fluids and dopamine pressure was held for approximately 30 minutes with resolution.  Findings: The abdominal aorta is opacified with a bolus injection contrast. Renal arteries are patent. The aorta itself has diffuse disease but no hemodynamically significant lesions. The common and external iliac arteries are widely patent bilaterally.  The right common femoral is widely patent as is the profunda femoris.  The SFA does indeed have a significant stenosis distally at Hunter's canal extending into the proximal popliteal of approximate 70%.  The distal popliteal demonstrates patency without hemodynamically significant lesion.  The trifurcation is heavily diseased and demonstrates atypical anatomy with the true peroneal originating medial to the posterior tibial as is normally encountered but then curving around and ending medially to the posterior tibial. Posterior tibial does not cross the ankle but is patent from its origin down to the ankle. The peroneal as  noted is occluded over approximately 6 cm proximally and has moderate disease noted distally. The peroneal does collateralize to the dorsalis pedis.   Following Crosser atherectomy and angioplasty of the peroneal this  tibial artery is now patent with in-line flow and looks quite nice. Angioplasty of the SFA at Hunter's canal yields an excellent result with less than 10% residual stenosis.  Summary: Successful recanalization right lower extremity for limb salvage   Disposition: Patient was taken to the recovery room in fair condition having tolerated the procedure well and responded to intervention for his hematoma.  ,  G 03/12/2016,5:14 PM 

## 2016-03-12 NOTE — OR Nursing (Signed)
Dr Juleen StarrSchier notified of Creatinine 2.15 drawn yesterday (without GFR calcuation) normal saline infusion bolus 250ml then followed by 150 ml/hr infusion.

## 2016-03-12 NOTE — OR Nursing (Signed)
Intermittent BBB noted on EKG, Dr Gilda CreaseSchnier aware.

## 2016-03-13 LAB — CBC
HCT: 32.8 % — ABNORMAL LOW (ref 40.0–52.0)
Hemoglobin: 11.2 g/dL — ABNORMAL LOW (ref 13.0–18.0)
MCH: 30 pg (ref 26.0–34.0)
MCHC: 34.3 g/dL (ref 32.0–36.0)
MCV: 87.5 fL (ref 80.0–100.0)
PLATELETS: 148 10*3/uL — AB (ref 150–440)
RBC: 3.74 MIL/uL — ABNORMAL LOW (ref 4.40–5.90)
RDW: 15.1 % — AB (ref 11.5–14.5)
WBC: 5.5 10*3/uL (ref 3.8–10.6)

## 2016-03-13 LAB — BASIC METABOLIC PANEL
ANION GAP: 7 (ref 5–15)
BUN: 48 mg/dL — AB (ref 6–20)
CALCIUM: 9.1 mg/dL (ref 8.9–10.3)
CO2: 31 mmol/L (ref 22–32)
CREATININE: 1.88 mg/dL — AB (ref 0.61–1.24)
Chloride: 105 mmol/L (ref 101–111)
GFR calc Af Amer: 40 mL/min — ABNORMAL LOW (ref >60)
GFR, EST NON AFRICAN AMERICAN: 34 mL/min — AB (ref >60)
GLUCOSE: 110 mg/dL — AB (ref 65–99)
Potassium: 4.2 mmol/L (ref 3.5–5.1)
Sodium: 143 mmol/L (ref 135–145)

## 2016-03-13 LAB — MRSA PCR SCREENING: MRSA by PCR: POSITIVE — AB

## 2016-03-13 MED ORDER — SODIUM CHLORIDE 0.9 % IV SOLN: INTRAVENOUS | Status: DC

## 2016-03-13 MED ORDER — IPRATROPIUM-ALBUTEROL 0.5-2.5 (3) MG/3ML IN SOLN: 3.0000 mL | Freq: Four times a day (QID) | RESPIRATORY_TRACT | Status: DC | PRN

## 2016-03-13 NOTE — Consult Note (Signed)
WOC wound consult note Reason for Consult: Chronic ulcer to right heel.  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  Due to be changed on Fridays.  Has daily dressing change to right heel.  Had revascularization procedure yesterday.  Stage 3 Pressure injury to left ischium Stage 3 pressure injury to sacrum Bruising to left groin and scrotum  Intact and very tender Wound type:Pressure injury and mixed venous and arterial ulcers to lower legs and heels.  Pressure Ulcer POA: Yes Measurement Right heel:  3 cm x 4.2 cm x 0.3 cm ruddy red Coccyx: 5 cm x 5 cm x 0.2 cm  Bilateral legs are in 3 layer compression with Aquacel Ag to wound beds.  Changed every Friday. Wound bed: Ruddy red Drainage (amount, consistency, odor)Heel:  Moderate serosanguinous  No odor.   Periwound: Erythema Dressing procedure/placement/frequency:  Cleanse wounds to right heel with NS and pat gently dry.  Apply Aquacel Ag to wound bed.  Cover with 4x4 gauze and secure with kling and tape. Change daily.  Cleanse open areas to sacrum and left ischium with soap and water.  Apply silicone foam dressing.  Change every 3 days and PRN soilage.   Will not follow at this time.  Please re-consult if needed.  Maple HudsonKaren Zayna Toste RN [name] CWON Pager [phone]

## 2016-03-13 NOTE — NC FL2 (Signed)
[place] MEDICAID FL2 LEVEL OF CARE SCREENING TOOL     IDENTIFICATION  Patient Name: [name] Birthdate: [birth year] Sex: male Admission Date (Current Location): 03/12/2016  [place] Number:  Chiropodist and Address:  [facility], [address], [place], [place] [address]      Provider Number: [id number]  Attending Physician Name and Address:  Renford Dills, MD  Relative Name and Phone Number:       Current Level of Care: Hospital Recommended Level of Care: Skilled Nursing Facility Prior Approval Number:    Date Approved/Denied:   PASRR Number:    Discharge Plan: SNF    Current Diagnoses: Patient Active Problem List   Diagnosis Date Noted  . Renal insufficiency 03/12/2016  . 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 Stage I pressure ulcer of right ankle 02/23/2016  . Stage I pressure ulcer of sacral region 02/23/2016  . Cellulitis 09/07/2015  . Sepsis (HCC) 09/05/2015    Orientation RESPIRATION BLADDER Height & Weight     Self, Time, Situation, Place  O2 (4 liters) Continent Weight: 268 lb 8.3 oz (121.8 kg) Height:   (180.3 cm)  BEHAVIORAL SYMPTOMS/MOOD NEUROLOGICAL BOWEL NUTRITION STATUS   (none)  (none) Continent Diet (carb modified)  AMBULATORY STATUS COMMUNICATION OF NEEDS Skin   Extensive Assist Verbally                         Personal Care Assistance Level of Assistance  Bathing, Dressing Bathing Assistance: Limited assistance   Dressing Assistance: Limited assistance     Functional Limitations Info   (none)          SPECIAL CARE FACTORS FREQUENCY                       Contractures Contractures Info: Not present    Additional Factors Info                  Current Medications (03/13/2016):  This is the current hospital active medication list Current Facility-Administered Medications  Medication Dose Route Frequency Provider Last Rate Last Dose   . acetaminophen (TYLENOL) tablet 325-650 mg  325-650 mg Oral Q4H PRN Renford Dills, MD       Or  . acetaminophen (TYLENOL) suppository 325-650 mg  325-650 mg Rectal Q4H PRN Renford Dills, MD      . allopurinol (ZYLOPRIM) tablet 100 mg  100 mg Oral QHS Renford Dills, MD   100 mg at 03/12/16 2216  . alum & mag hydroxide-simeth (MAALOX/MYLANTA) 200-200-20 MG/5ML suspension 15-30 mL  15-30 mL Oral Q2H PRN Renford Dills, MD      . cholecalciferol (VITAMIN D) tablet 1,000 Units  1,000 Units Oral Daily Renford Dills, MD   1,000 Units at 03/13/16 1030  . collagenase (SANTYL) ointment 1 application  1 application Topical Daily Renford Dills, MD      . dextromethorphan (DELSYM) 30 MG/5ML liquid 60 mg  60 mg Oral Q12H PRN Renford Dills, MD      . divalproex (DEPAKOTE) DR tablet 250 mg  250 mg Oral BID Renford Dills, MD   250 mg at 03/13/16 1030  . finasteride (PROSCAR) tablet 5 mg  5 mg Oral Daily Renford Dills, MD   5 mg at 03/13/16 1030  . [START ON 03/14/2016] fluconazole (DIFLUCAN) tablet 150 mg  150 mg Oral  Q Thu Renford DillsGregory G Schnier, MD      . furosemide (LASIX) tablet 60 mg  60 mg Oral BID Renford DillsGregory G Schnier, MD   60 mg at 03/13/16 [id number]  . gabapentin (NEURONTIN) capsule 400 mg  400 mg Oral TID Renford DillsGregory G Schnier, MD   400 mg at 03/13/16 1030  . guaiFENesin (MUCINEX) 12 hr tablet 600 mg  600 mg Oral Q12H Renford DillsGregory G Schnier, MD   600 mg at 03/13/16 1030  . ipratropium-albuterol (DUONEB) 0.5-2.5 (3) MG/3ML nebulizer solution 3 mL  3 mL Nebulization Q6H PRN Renford DillsGregory G Schnier, MD      . loperamide (IMODIUM) capsule 2 mg  2 mg Oral Q4H PRN Renford DillsGregory G Schnier, MD      . loratadine (CLARITIN) tablet 10 mg  10 mg Oral Daily Renford DillsGregory G Schnier, MD   10 mg at 03/13/16 1030  . magnesium hydroxide (MILK OF MAGNESIA) suspension 30 mL  30 mL Oral Daily PRN Renford DillsGregory G Schnier, MD      . metoprolol (LOPRESSOR) tablet 50 mg  50 mg Oral Daily Renford DillsGregory G Schnier, MD   50 mg at 03/13/16 1030  .  morphine 4 MG/ML injection 2-4 mg  2-4 mg Intravenous Q1H PRN Renford DillsGregory G Schnier, MD   4 mg at 03/12/16 2217  . ondansetron (ZOFRAN) injection 4 mg  4 mg Intravenous Q6H PRN Renford DillsGregory G Schnier, MD      . oxyCODONE (Oxy IR/ROXICODONE) immediate release tablet 5-10 mg  5-10 mg Oral Q4H PRN Renford DillsGregory G Schnier, MD   5 mg at 03/13/16 1126  . pantoprazole (PROTONIX) EC tablet 40 mg  40 mg Oral Daily Renford DillsGregory G Schnier, MD   40 mg at 03/13/16 1030  . sertraline (ZOLOFT) tablet 100 mg  100 mg Oral Daily Renford DillsGregory G Schnier, MD   100 mg at 03/13/16 1030  . tamsulosin (FLOMAX) capsule 0.4 mg  0.4 mg Oral Daily Renford DillsGregory G Schnier, MD   0.4 mg at 03/13/16 1030  . traZODone (DESYREL) tablet 50 mg  50 mg Oral QHS Renford DillsGregory G Schnier, MD   50 mg at 03/12/16 2216     Discharge Medications: Please see discharge summary for a list of discharge medications.  Relevant Imaging Results:  Relevant Lab Results:   Additional Information    York SpanielMonica Thamas Appleyard, LCSW

## 2016-03-13 NOTE — Progress Notes (Signed)
[facility]  Daily Progress Note   Subjective  - 1 Day Post-Op  Patient denies left groin pain back pain has resolved he states he is hungry  Objective Filed Vitals:   03/13/16 1400 03/13/16 1500 03/13/16 1600 03/13/16 1738  BP: 98/53 104/60 111/73 102/51  Pulse: 92 88 91 83  Temp:   98.3 F (36.8 C) 98 F (36.7 C)  TempSrc:    Oral  Resp: 17 15 20 18   Height:      Weight:      SpO2: 93% 98% 97% 93%    Intake/Output Summary (Last 24 hours) at 03/13/16 1805 Last data filed at 03/13/16 1638  Gross per 24 hour  Intake 1452.32 ml  Output   2300 ml  Net -847.68 ml    PULM  Normal effort , no use of accessory muscles CV  No JVD, RRR Abd      No distended, nontender VASC  Right foot is hyperemic with brisk capillary refill wound is unchanged left groin is soft  Laboratory CBC    Component Value Date/Time   WBC 5.5 03/13/2016 0441   WBC 5.2 11/26/2014 0459   HGB 11.2* 03/13/2016 0441   HGB 9.4* 11/26/2014 0459   HCT 32.8* 03/13/2016 0441   HCT 29.3* 11/26/2014 0459   PLT 148* 03/13/2016 0441   PLT 116* 11/26/2014 0459    BMET    Component Value Date/Time   NA 143 03/13/2016 0441   NA 144 11/26/2014 0459   K 4.2 03/13/2016 0441   K 3.8 11/26/2014 0459   CL 105 03/13/2016 0441   CL 106 11/26/2014 0459   CO2 31 03/13/2016 0441   CO2 33* 11/26/2014 0459   GLUCOSE 110* 03/13/2016 0441   GLUCOSE 108* 11/26/2014 0459   BUN 48* 03/13/2016 0441   BUN 13 11/26/2014 0459   CREATININE 1.88* 03/13/2016 0441   CREATININE 1.08 11/26/2014 0459   CALCIUM 9.1 03/13/2016 0441   CALCIUM 8.6 11/26/2014 0459   GFRNONAA 34* 03/13/2016 0441   GFRNONAA >60 11/26/2014 0459   GFRAA 40* 03/13/2016 0441   GFRAA >60 11/26/2014 0459    Assessment/Planning: POD #1 s/p revascularization of the right lower extremity for limb salvage, located with a left groin hematoma   Patient is off his dopamine his creatinine is improved compared to his preop he  continues at low-dose saline infusion and this tolerating by mouth without difficulty. Fluids are encouraged. He will be transferred out of the unit observed one more day and if this renal function is stable he will [facility]    [name], [name]  03/13/2016, 6:05 PM

## 2016-03-13 NOTE — Care Management (Signed)
Patient presents from [facility] for what appears to be an elective vascular procedure.  Had hematoma during procedure which resolved. Is on low dose Dopamine.  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  Anticipate return to facility.  CSW consult

## 2016-03-13 NOTE — Clinical Social Work Note (Signed)
Clinical Social Work Assessment  Patient Details  Name: [name] MRN: [id number] Date of Birth: 06/25/1946  Date of referral:  03/13/16               Reason for consult:  Facility Placement                Permission sought to share information with:  Facility Art therapist granted to share information::  Yes, Verbal Permission Granted  Name::        Agency::     Relationship::     Contact Information:     Housing/Transportation Living arrangements for the past 2 months:  [place] of Information:  Patient Patient Interpreter Needed:  None Criminal Activity/Legal Involvement Pertinent to Current Situation/Hospitalization:  No - Comment as needed Significant Relationships:  Siblings Lives with:  Facility Resident Do you feel safe going back to the place where you live?  Yes Need for family participation in patient care:  No (Coment)  Care giving concerns:  Patient is a long term care resident at [facility].   Social Worker assessment / plan:  CSW consulted due to patient being a resident of [facility]. CSW was informed by [place] at [facility] that patient can return when time. CSW met with patient this afternoon and he was having some difficulty with his blood pressure and was sleepy. Patient was able to tell me that he has been at [facility] for about 2 1/2 years and that he is willing to return there when time. Patient informed CSW that he has a sister that lives locally and had just been in to visit.  Employment status:  Retired Nurse, adult PT Recommendations:  Not assessed at this time Information / Referral to community resources:     Patient/Family's Response to care:  Patient expressed appreciation for CSW assistance.  Patient/Family's Understanding of and Emotional Response to Diagnosis, Current Treatment, and Prognosis:  Patient was not feeling well at the time of assessment but was able to answer questions  appropriately.  Emotional Assessment Appearance:  Appears stated age Attitude/Demeanor/Rapport:   (pleasant and cooperative) Affect (typically observed):  Calm Orientation:  Oriented to Self, Oriented to Place, Oriented to  Time, Oriented to Situation Alcohol / Substance use:  Not Applicable Psych involvement (Current and /or in the community):  No (Comment)  Discharge Needs  Concerns to be addressed:  No discharge needs identified Readmission within the last 30 days:  No Current discharge risk:  None Barriers to Discharge:  No Barriers Identified   Shela Leff, [place] 03/13/2016, 2:20 PM

## 2016-03-13 NOTE — Progress Notes (Signed)
Patient alert and oriented x4, rested well throughout the night. Patient currently on 254mcg/kg/min of Dopamine, per MD request.  Per MD, 15mL of air removed from PAD yesterday evening, 45mL remains.  Site remains a level 1 with no active bleeding, pedal pulses detected bilaterally with the doppler.  Patient remains in NSR via heart monitor with occasional PVC's.  All other vital signs remain stable and patient has put 900mL out of foley catheter.  See flowsheet for more detailed assessment.  Will continue to monitor.

## 2016-03-14 ENCOUNTER — Encounter: Payer: Self-pay | Admitting: Vascular Surgery

## 2016-03-14 DIAGNOSIS — L8990 Pressure ulcer of unspecified site, unspecified stage: Secondary | ICD-10-CM | POA: Insufficient documentation

## 2016-03-14 LAB — GLUCOSE, CAPILLARY
GLUCOSE-CAPILLARY: 113 mg/dL — AB (ref 65–99)
GLUCOSE-CAPILLARY: 104 mg/dL — AB (ref 65–99)
GLUCOSE-CAPILLARY: 130 mg/dL — AB (ref 65–99)
Glucose-Capillary: 99 mg/dL (ref 65–99)
Glucose-Capillary: 146 mg/dL — ABNORMAL HIGH (ref 65–99)

## 2016-03-14 LAB — BASIC METABOLIC PANEL
Anion gap: 10 (ref 5–15)
BUN: 51 mg/dL — ABNORMAL HIGH (ref 6–20)
CHLORIDE: 100 mmol/L — AB (ref 101–111)
CO2: 27 mmol/L (ref 22–32)
CREATININE: 1.97 mg/dL — AB (ref 0.61–1.24)
Calcium: 8.5 mg/dL — ABNORMAL LOW (ref 8.9–10.3)
GFR calc non Af Amer: 32 mL/min — ABNORMAL LOW (ref >60)
GFR, EST AFRICAN AMERICAN: 38 mL/min — AB (ref >60)
GLUCOSE: 109 mg/dL — AB (ref 65–99)
Potassium: 3.6 mmol/L (ref 3.5–5.1)
Sodium: 137 mmol/L (ref 135–145)

## 2016-03-14 MED ORDER — INSULIN ASPART 100 UNIT/ML ~~LOC~~ SOLN: 0.0000 [IU] | Freq: Every day | SUBCUTANEOUS | Status: DC

## 2016-03-14 MED ORDER — CHLORHEXIDINE GLUCONATE CLOTH 2 % EX PADS
6.0000 | MEDICATED_PAD | Freq: Every day | CUTANEOUS | Status: DC
  Administered 2016-03-14 – 2016-03-15 (×2): 6 via TOPICAL

## 2016-03-14 MED ORDER — MUPIROCIN 2 % EX OINT
1.0000 "application " | TOPICAL_OINTMENT | Freq: Two times a day (BID) | CUTANEOUS | Status: DC
  Administered 2016-03-14 – 2016-03-15 (×3): 1 via NASAL
  Filled 2016-03-14: qty 22

## 2016-03-14 MED ORDER — INSULIN ASPART 100 UNIT/ML ~~LOC~~ SOLN
0.0000 [IU] | Freq: Three times a day (TID) | SUBCUTANEOUS | Status: DC
  Administered 2016-03-14 – 2016-03-15 (×2): 1 [IU] via SUBCUTANEOUS
  Filled 2016-03-14 (×2): qty 1

## 2016-03-14 MED ORDER — TRAMADOL HCL 50 MG PO TABS
50.0000 mg | ORAL_TABLET | Freq: Four times a day (QID) | ORAL | Status: DC | PRN
  Administered 2016-03-14: 50 mg via ORAL
  Filled 2016-03-14: qty 1

## 2016-03-14 MED ORDER — FUROSEMIDE 40 MG PO TABS
40.0000 mg | ORAL_TABLET | Freq: Every day | ORAL | Status: DC
  Administered 2016-03-14 – 2016-03-15 (×2): 40 mg via ORAL
  Filled 2016-03-14 (×2): qty 1

## 2016-03-14 NOTE — Progress Notes (Signed)
Per Dr. Allena KatzPatel saline lock IV. Will discontinue fluids per MD

## 2016-03-14 NOTE — Progress Notes (Signed)
Per Dr. Gilda CreaseSchnier okay to place order for MRSA + standing orders

## 2016-03-14 NOTE — Care Management Important Message (Signed)
Important Message  Patient Details  Name: [name] MRN: [id number] Date of Birth: 04/21/1946   Medicare Important Message Given:  Yes    Tylesha Gibeault, Derrill MemoGenese M, RN 03/14/2016, 10:15 AM

## 2016-03-14 NOTE — Plan of Care (Signed)
Problem: Activity: Goal: Risk for activity intolerance will decrease Outcome: Not Progressing Patient cannot move lower extrmeties.

## 2016-03-14 NOTE — Consult Note (Addendum)
[facility] [place] [facility]   PATIENT NAME: [name]    MR#:  [id number]  DATE OF BIRTH:  1946-08-13  DATE OF ADMISSION:  03/12/2016  PRIMARY CARE PHYSICIAN: Dimple Casey, MD   REQUESTING/REFERRING PHYSICIAN: dr schnier  CHIEF COMPLAINT:  No chief complaint on file. Consult requested for management of diabetes.  HISTORY OF PRESENT ILLNESS:  [name]  is a 70 y.o. male with a known history of Peripheral vascular disease, type 2 diabetes, sleep apnea on CPAP, COPD, hypertension is admitted on surgical service with peripheral vascular disease and right lower extremity ulcer. Internal medicine was consulted for management of diabetes. It is been noted patient's sugars have been running anywhere from 70s to 100 100s. he is on insulin 7030 at home. Over here patient is mainly managed with sliding scale insulin. Patient underwent angiogram of his lower extremity yesterday. This morning he was somewhat combative. He settled down during my interview with him. Denies any complaints other than " he saying to leave me alone.  PAST MEDICAL HISTORY:   Past Medical History  Diagnosis Date  . Bacteremia   . GERD (gastroesophageal reflux disease)   . Hematuria   . Urinary incontinence   . Sleep apnea     on CPAP  . Hyperlipemia   . 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 COPD (chronic obstructive pulmonary disease) (HCC)   . Hypertension   . Lymphedema   . Anxiety disorder   . CHF (congestive heart failure) (HCC)     diastolic dysfunction, last ECHO from 2015, EF 50-55%  . Muscle weakness   . Morbid obesity (HCC)   . Difficulty in walking     bedbound  . Chronic respiratory failure (HCC)     on 2l oxygen continuous  . Diabetes mellitus (HCC)   . Gout   . CKD (chronic kidney disease)   . Heel ulcer (HCC)   . Urinary retention     PAST SURGICAL HISTOIRY:   Past Surgical History  Procedure  Laterality Date  . Coronary angioplasty with stent placement    . Hernia repair      hiatal hernia repair  . Coronary artery bypass graft    . Appendectomy    . Peripheral vascular catheterization Right 03/12/2016    Procedure: Lower Extremity Angiography;  Surgeon: Renford Dills, MD;  Location: [facility] INVASIVE CV LAB;  Service: Cardiovascular;  Laterality: Right;  . Peripheral vascular catheterization  03/12/2016    Procedure: Lower Extremity Intervention;  Surgeon: Renford Dills, MD;  Location: [facility] INVASIVE CV LAB;  Service: Cardiovascular;;    SOCIAL HISTORY:   Social History  Substance Use Topics  . Smoking status: Former Games developer  . Smokeless tobacco: Not on file  . Alcohol Use: No    FAMILY HISTORY:   Family History  Problem Relation Age of Onset  . Kidney disease Neg Hx   . Prostate cancer Neg Hx   . Alzheimer's disease Mother   . Heart attack Father     DRUG ALLERGIES:  No Known Allergies  REVIEW OF SYSTEMS:   Review of Systems  Constitutional: Negative for fever, chills and weight loss.  HENT: Negative for ear discharge, ear pain and nosebleeds.   Eyes: Negative for blurred vision, pain and discharge.  Respiratory: Negative for sputum production, shortness of breath, wheezing and stridor.   Cardiovascular: Negative for chest pain, palpitations, orthopnea and PND.  Gastrointestinal: Negative  for nausea, vomiting, abdominal pain and diarrhea.  Genitourinary: Negative for urgency and frequency.  Musculoskeletal: Positive for joint pain. Negative for back pain.  Neurological: Positive for sensory change and weakness. Negative for speech change and focal weakness.  Psychiatric/Behavioral: Negative for depression and hallucinations. The patient is not nervous/anxious.   All other systems reviewed and are negative.  MEDICATIONS AT HOME:   Prior to Admission medications   Medication Sig Start Date End Date Taking? Authorizing Provider  acetaminophen (TYLENOL)  500 MG tablet Take 500 mg by mouth every 6 (six) hours as needed for mild pain or moderate pain.   Yes Historical Provider, MD  allopurinol (ZYLOPRIM) 100 MG tablet Take 100 mg by mouth at bedtime.   Yes Historical Provider, MD  Cholecalciferol (VITAMIN D3) 5000 units TABS Take 1 tablet by mouth daily.   Yes Historical Provider, MD  collagenase (SANTYL) ointment Apply 1 application topically daily. Pt applies to right heel.   Yes Historical Provider, MD  dextromethorphan (DELSYM) 30 MG/5ML liquid Take 60 mg by mouth every 12 (twelve) hours as needed for cough.   Yes Historical Provider, MD  divalproex (DEPAKOTE) 250 MG DR tablet Take 250 mg by mouth 2 (two) times daily.   Yes Historical Provider, MD  DOXYCYCLINE HYCLATE PO Take 100 mg by mouth 2 (two) times daily.   Yes Historical Provider, MD  finasteride (PROSCAR) 5 MG tablet Take 1 tablet (5 mg total) by mouth daily. 09/07/15  Yes Enid Baasadhika Kalisetti, MD  fluconazole (DIFLUCAN) 150 MG tablet Take 150 mg by mouth once a week. Pt takes on Thursday.   Yes Historical Provider, MD  furosemide (LASIX) 20 MG tablet Take 60 mg by mouth 2 (two) times daily.   Yes Historical Provider, MD  gabapentin (NEURONTIN) 400 MG capsule Take 400 mg by mouth 3 (three) times daily.   Yes Historical Provider, MD  guaiFENesin (MUCINEX) 600 MG 12 hr tablet Take 600 mg by mouth every 12 (twelve) hours.   Yes Historical Provider, MD  ipratropium-albuterol (DUONEB) 0.5-2.5 (3) MG/3ML SOLN Take 3 mLs by nebulization every 4 (four) hours as needed.    Yes Historical Provider, MD  loperamide (IMODIUM) 2 MG capsule Take 2 mg by mouth every 4 (four) hours as needed for diarrhea or loose stools.   Yes Historical Provider, MD  loratadine (CLARITIN) 10 MG tablet Take 10 mg by mouth daily.   Yes Historical Provider, MD  magnesium hydroxide (MILK OF MAGNESIA) 400 MG/5ML suspension Take 30 mLs by mouth daily as needed for mild constipation.   Yes Historical Provider, MD  Menthol, Topical  Analgesic, (BIOFREEZE EX) Apply 1 application topically every 12 (twelve) hours as needed (for pain).    Yes Historical Provider, MD  metoprolol (LOPRESSOR) 50 MG tablet Take 50 mg by mouth daily.   Yes Historical Provider, MD  omeprazole (PRILOSEC) 20 MG capsule Take 20 mg by mouth daily.    Yes Historical Provider, MD  oxyCODONE (OXY IR/ROXICODONE) 5 MG immediate release tablet Take 2 tablets (10 mg total) by mouth 3 (three) times daily as needed for moderate pain or severe pain. 01/08/16  Yes Houston SirenVivek J Sainani, MD  Salicylic Acid (SELSUN BLUE NATURALS) 3 % SHAM Apply 1 application topically once a week.   Yes Historical Provider, MD  sertraline (ZOLOFT) 100 MG tablet Take 100 mg by mouth daily.    Yes Historical Provider, MD  Silver (AQUACEL AG FOAM) 4"X4" PADS Apply 1 application topically daily.   Yes Historical Provider, MD  tamsulosin (FLOMAX) 0.4 MG CAPS capsule Take 0.4 mg by mouth daily.   Yes Historical Provider, MD  traZODone (DESYREL) 50 MG tablet Take 50 mg by mouth at bedtime.   Yes Historical Provider, MD  Multiple Vitamin (MULTIVITAMIN WITH MINERALS) TABS tablet Take 1 tablet by mouth daily.    Historical Provider, MD  nystatin (MYCOSTATIN/NYSTOP) 100000 UNIT/GM POWD Apply topically 3 (three) times daily.    Historical Provider, MD  oxyCODONE (OXY IR/ROXICODONE) 5 MG immediate release tablet Take 2-4 tablets (10-20 mg total) by mouth every 6 (six) hours as needed for severe pain. 01/08/16   Houston Siren, MD      VITAL SIGNS:  Blood pressure 116/60, pulse 94, temperature 98.5 F (36.9 C), temperature source Axillary, resp. rate 20, height 5\' 11"  (1.803 m), weight 121.8 kg (268 lb 8.3 oz), SpO2 92 %.  PHYSICAL EXAMINATION:  GENERAL:  70 y.o.-year-old patient lying in the bed with no acute distress.  EYES: Pupils equal, round, reactive to light and accommodation. No scleral icterus. Extraocular muscles intact.  HEENT: Head atraumatic, normocephalic. Oropharynx and nasopharynx  clear.  NECK:  Supple, no jugular venous distention. No thyroid enlargement, no tenderness.  LUNGS: Normal breath sounds bilaterally, no wheezing, rales,rhonchi or crepitation. No use of accessory muscles of respiration.  CARDIOVASCULAR: S1, S2 normal. No murmurs, rubs, or gallops.  ABDOMEN: Soft, nontender, nondistended. Bowel sounds present. No organomegaly or mass.  EXTREMITIES: No pedal edema, cyanosis, or clubbing. Bilateral lower extremity  dressing present NEUROLOGIC: Cranial nerves II through XII are intact. Muscle strength 5/5 in all extremities. Sensation intact. Gait not checked.  PSYCHIATRIC: The patient is alert and oriented x 3.  SKIN: No obvious rash, lesion, or ulcer.   LABORATORY PANEL:   CBC  Recent Labs Lab 03/13/16 0441  WBC 5.5  HGB 11.2*  HCT 32.8*  PLT 148*   ------------------------------------------------------------------------------------------------------------------  Chemistries   Recent Labs Lab 03/14/16 0412  NA 137  K 3.6  CL 100*  CO2 27  GLUCOSE 109*  BUN 51*  CREATININE 1.97*  CALCIUM 8.5*   IMPRESSION AND PLAN:  Kalijah Zeiss  is a 70 y.o. male with a known history of Peripheral vascular disease, type 2 diabetes, sleep apnea on CPAP, COPD, hypertension is admitted on surgical service with peripheral vascular disease and right lower extremity ulcer. Internal medicine was consulted for management of diabetes. It is been noted patient's sugars have been running anywhere from 70s to 100 100s.   1. Type 2 diabetes. Patient's sugars are very well controlled. We'll keep him on sliding scale. We'll DC his insulin  70/30  2. Hypertension continue home meds. He had related to hypotension we'll hold all by mouth meds today.  3. Severe peripheral vascular disease per vascular surgery Patient underwent lower extremity PTCA and right superficial femoral and popliteal arteries along with PTA of right peroneal artery  4. COPD When necessary nebs  continue oxygen as needed. Patient does not use chronic home oxygen.  5. DVT prophylaxis subcutaneous Lovenox.  Thank you for the consult will follow while patient is in house.    All the records are reviewed and case discussed with Consulting provider. Management plans discussed with the patient, family and they are in agreement.  CODE STATUS: full  TOTAL TIME TAKING CARE OF THIS PATIENT:45 minutes.    Tamkia Temples M.D on 03/14/2016 at 2:55 PM  Between 7am to 6pm - Pager - [phone]  After 6pm go to [email or web address] - password [email or web address] [facility]  [place] [facility]  Office  [phone]  CC: Primary care Physician: Dimple Casey, MD

## 2016-03-14 NOTE — Progress Notes (Signed)
Per Dr. Allena KatzPatel hold metoprolol for low BP and pulse

## 2016-03-14 NOTE — Progress Notes (Signed)
Notified Vascular PA of increased in bruising to groin. No new orders received. Also notified PA that patient is no longer confused and agitated. Will continue to monitor.

## 2016-03-14 NOTE — Consult Note (Signed)
[facility]  Orders changed to reflect this.  Daily Santyl dressing change to right heel.  Unnas boots changed on Friday.  WOC team will follow.   Maple HudsonKaren Tayvin Preslar RN [name] CWON Pager [phone]

## 2016-03-14 NOTE — Consult Note (Signed)
  Psychiatry: Chart reviewed. Patient appears to have a history of episodes of delirium on several prior hospital stays. Will Follow up tomorrow.
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
## 2016-03-15 ENCOUNTER — Ambulatory Visit: Payer: Medicare Other | Admitting: Surgery

## 2016-03-15 DIAGNOSIS — R410 Disorientation, unspecified: Secondary | ICD-10-CM

## 2016-03-15 LAB — GLUCOSE, CAPILLARY
GLUCOSE-CAPILLARY: 132 mg/dL — AB (ref 65–99)
Glucose-Capillary: 119 mg/dL — ABNORMAL HIGH (ref 65–99)

## 2016-03-15 LAB — CBC
HEMATOCRIT: 27.7 % — AB (ref 40.0–52.0)
HEMOGLOBIN: 9.2 g/dL — AB (ref 13.0–18.0)
MCH: 29.6 pg (ref 26.0–34.0)
MCHC: 33.1 g/dL (ref 32.0–36.0)
MCV: 89.3 fL (ref 80.0–100.0)
PLATELETS: 105 10*3/uL — AB (ref 150–440)
RBC: 3.1 MIL/uL — ABNORMAL LOW (ref 4.40–5.90)
RDW: 14.5 % (ref 11.5–14.5)
WBC: 3.4 10*3/uL — ABNORMAL LOW (ref 3.8–10.6)

## 2016-03-15 LAB — BASIC METABOLIC PANEL
ANION GAP: 8 (ref 5–15)
BUN: 51 mg/dL — ABNORMAL HIGH (ref 6–20)
CALCIUM: 8.9 mg/dL (ref 8.9–10.3)
CO2: 29 mmol/L (ref 22–32)
Chloride: 104 mmol/L (ref 101–111)
Creatinine, Ser: 1.88 mg/dL — ABNORMAL HIGH (ref 0.61–1.24)
GFR, EST AFRICAN AMERICAN: 40 mL/min — AB (ref >60)
GFR, EST NON AFRICAN AMERICAN: 34 mL/min — AB (ref >60)
Glucose, Bld: 129 mg/dL — ABNORMAL HIGH (ref 65–99)
POTASSIUM: 3.7 mmol/L (ref 3.5–5.1)
Sodium: 141 mmol/L (ref 135–145)

## 2016-03-15 LAB — MAGNESIUM: Magnesium: 1.8 mg/dL (ref 1.7–2.4)

## 2016-03-15 MED ORDER — HALOPERIDOL LACTATE 5 MG/ML IJ SOLN: 1.0000 mg | Freq: Four times a day (QID) | INTRAMUSCULAR | Status: DC | PRN

## 2016-03-15 MED ORDER — OXYCODONE HCL 5 MG PO TABS: ORAL_TABLET | ORAL | Status: AC

## 2016-03-15 MED ORDER — OXYCODONE HCL 5 MG PO TABS: 5.0000 mg | ORAL_TABLET | Freq: Four times a day (QID) | ORAL | Status: AC | PRN

## 2016-03-15 MED ORDER — INSULIN ASPART 100 UNIT/ML ~~LOC~~ SOLN: 0.0000 [IU] | Freq: Every day | SUBCUTANEOUS | Status: AC

## 2016-03-15 MED ORDER — INSULIN ASPART 100 UNIT/ML ~~LOC~~ SOLN: 0.0000 [IU] | Freq: Three times a day (TID) | SUBCUTANEOUS | Status: AC

## 2016-03-15 NOTE — Discharge Summary (Signed)
[facility]    Discharge Summary    Patient ID:  [name] MRN: [id number] DOB/AGE: 12-01-1945 70 y.o.  Admit date: 03/12/2016 Discharge date: 03/15/2016 Date of Surgery: 03/12/2016 Surgeon: Surgeon(s): Renford DillsGregory G Schnier, MD  Admission Diagnosis: RT leg angio    ASO w heel ulcer  Discharge Diagnoses:  RT leg angio    ASO w heel ulcer  Secondary Diagnoses: Past Medical History  Diagnosis Date  . Bacteremia   . GERD (gastroesophageal reflux disease)   . Hematuria   . Urinary incontinence   . Sleep apnea     on CPAP  . Hyperlipemia   . 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 COPD (chronic obstructive pulmonary disease) (HCC)   . Hypertension   . Lymphedema   . Anxiety disorder   . CHF (congestive heart failure) (HCC)     diastolic dysfunction, last ECHO from 2015, EF 50-55%  . Muscle weakness   . Morbid obesity (HCC)   . Difficulty in walking     bedbound  . Chronic respiratory failure (HCC)     on 2l oxygen continuous  . Diabetes mellitus (HCC)   . Gout   . CKD (chronic kidney disease)   . Heel ulcer (HCC)   . Urinary retention     Procedure(s): Lower Extremity Angiography Lower Extremity Intervention  Discharged Condition: stable  HPI:  [name] is a 70 year old male who presented with increasing pain and nonhealing ulceration of the right lower extremity. The patient was at risk for limb loss as he is had limb threatening ischemia and on 03/12/16, he underwent Introduction catheter into right lower extremity 3rd order catheter placement, Contrast injection right lower extremity for distal runoff, Percutaneous transluminal angioplasty right superficial femoral and popliteal arteries to 5 mm with Lutonix, Crosser atherectomy right peroneal with percutaneous transluminal angioplasty right perineal to 3 mm, Star close closure left common femoral arteriotomy.  He tolerated the procedure  well and was transferred from the radiology suite to recovery without issue.  In the recovery room, a left groin hematoma was noted. He also had low urine output and low BP's. Patient was transferred to ICU for close observation, hydrated and placed on dopamine. He remained in the ICU overnight and transferred to the surgical floor the next day as his urine output increased and he was weaned off of the dopamine.   POD#2 during morning rounds patient was very agitated and combative threatening to hit people. Seen by psychiatry. No new meds or intervention needed as most likely sundowning as per pysch. By the afternoon, the patient was much calmer and appropriate.  Patient followed by medicine for DM. Novolog 70/30 discontinued. Medicine does not feel this needs to be restarted at discharge.   Hospital Course:  [name] is a 70 y.o. male is S/P Right Procedure(s): Lower Extremity Angiography Lower Extremity Intervention  Physical exam:  A&Ox3, NAD CV: RRR Pulmonary: CTA Bilaterally Abdomen: Soft, Nontender, Nondistended Groin: Hematoma stable. Ecchymosis noted. No active draining.  Vascular: Bilateral legs warm. Wound care dressing applied to ulcerations.   Pt. Ambulating, voiding and taking PO diet without difficulty. Pt pain controlled with PO pain meds.  Labs as below  Complications: Left Groin Hematoma  Consults:  Treatment Team:  Audery AmelJohn T Clapacs, MD  (Psychiatry)  Significant Diagnostic Studies: CBC Lab Results  Component Value Date   WBC 3.4* 03/15/2016   HGB 9.2* 03/15/2016  HCT 27.7* 03/15/2016   MCV 89.3 03/15/2016   PLT 105* 03/15/2016   BMET    Component Value Date/Time   NA 141 03/15/2016 0410   NA 144 11/26/2014 0459   K 3.7 03/15/2016 0410   K 3.8 11/26/2014 0459   CL 104 03/15/2016 0410   CL 106 11/26/2014 0459   CO2 29 03/15/2016 0410   CO2 33* 11/26/2014 0459   GLUCOSE 129* 03/15/2016 0410   GLUCOSE 108* 11/26/2014 0459   BUN 51*  03/15/2016 0410   BUN 13 11/26/2014 0459   CREATININE 1.88* 03/15/2016 0410   CREATININE 1.08 11/26/2014 0459   CALCIUM 8.9 03/15/2016 0410   CALCIUM 8.6 11/26/2014 0459   GFRNONAA 34* 03/15/2016 0410   GFRNONAA >60 11/26/2014 0459   GFRAA 40* 03/15/2016 0410   GFRAA >60 11/26/2014 0459   COAG Lab Results  Component Value Date   INR 1.2 11/23/2014   INR 1.2 11/12/2014   INR 1.2 10/11/2014   Disposition:  Discharge to :Nursing Home    Medication List    TAKE these medications        acetaminophen 500 MG tablet  Commonly known as:  TYLENOL  Take 500 mg by mouth every 6 (six) hours as needed for mild pain or moderate pain.     allopurinol 100 MG tablet  Commonly known as:  ZYLOPRIM  Take 100 mg by mouth at bedtime.     AQUACEL AG FOAM 4"X4" Pads  Apply 1 application topically daily.     BIOFREEZE EX  Apply 1 application topically every 12 (twelve) hours as needed (for pain).     collagenase ointment  Commonly known as:  SANTYL  Apply 1 application topically daily. Pt applies to right heel.     DELSYM 30 MG/5ML liquid  Generic drug:  dextromethorphan  Take 60 mg by mouth every 12 (twelve) hours as needed for cough.     divalproex 250 MG DR tablet  Commonly known as:  DEPAKOTE  Take 250 mg by mouth 2 (two) times daily.     DOXYCYCLINE HYCLATE PO  Take 100 mg by mouth 2 (two) times daily.     finasteride 5 MG tablet  Commonly known as:  PROSCAR  Take 1 tablet (5 mg total) by mouth daily.     fluconazole 150 MG tablet  Commonly known as:  DIFLUCAN  Take 150 mg by mouth once a week. Pt takes on Thursday.     furosemide 20 MG tablet  Commonly known as:  LASIX  Take 60 mg by mouth 2 (two) times daily.     gabapentin 400 MG capsule  Commonly known as:  NEURONTIN  Take 400 mg by mouth 3 (three) times daily.     guaiFENesin 600 MG 12 hr tablet  Commonly known as:  MUCINEX  Take 600 mg by mouth every 12 (twelve) hours.     insulin aspart 100 UNIT/ML  injection  Commonly known as:  novoLOG  Inject 0-5 Units into the skin at bedtime.     insulin aspart 100 UNIT/ML injection  Commonly known as:  novoLOG  Inject 0-9 Units into the skin 3 (three) times daily with meals.     ipratropium-albuterol 0.5-2.5 (3) MG/3ML Soln  Commonly known as:  DUONEB  Take 3 mLs by nebulization every 4 (four) hours as needed.     loperamide 2 MG capsule  Commonly known as:  IMODIUM  Take 2 mg by mouth every 4 (four) hours as needed  for diarrhea or loose stools.     loratadine 10 MG tablet  Commonly known as:  CLARITIN  Take 10 mg by mouth daily.     metoprolol 50 MG tablet  Commonly known as:  LOPRESSOR  Take 50 mg by mouth daily.     MILK OF MAGNESIA 400 MG/5ML suspension  Generic drug:  magnesium hydroxide  Take 30 mLs by mouth daily as needed for mild constipation.     multivitamin with minerals Tabs tablet  Take 1 tablet by mouth daily.     nystatin powder  Generic drug:  nystatin  Apply topically 3 (three) times daily.     omeprazole 20 MG capsule  Commonly known as:  PRILOSEC  Take 20 mg by mouth daily.     oxyCODONE 5 MG immediate release tablet  Commonly known as:  Oxy IR/ROXICODONE  One tab by mouth every six hours as needed for moderate to severe pain     SELSUN BLUE NATURALS 3 % Sham  Generic drug:  Salicylic Acid  Apply 1 application topically once a week.     sertraline 100 MG tablet  Commonly known as:  ZOLOFT  Take 100 mg by mouth daily.     tamsulosin 0.4 MG Caps capsule  Commonly known as:  FLOMAX  Take 0.4 mg by mouth daily.     traZODone 50 MG tablet  Commonly known as:  DESYREL  Take 50 mg by mouth at bedtime.     Vitamin D3 5000 units Tabs  Take 1 tablet by mouth daily.       Verbal and written Discharge instructions given to the patient. Wound care per Discharge AVS Follow-up Information    Follow up with Schnier, Latina Craver, MD In 3 weeks.   Specialties:  Vascular Surgery, Cardiology, Radiology,  Vascular Surgery   Why:  ABI with Right Lower Extremity Arterial Duplex   Contact information:   [address] [place] [address] [phone]      Signed: Tonette Lederer, PA-C  03/15/2016, 2:33 PM

## 2016-03-15 NOTE — Progress Notes (Signed)
Patient picked up by EMS. Patient is alert and oriented no acute distress noted. Dressing in place and dry and intact.

## 2016-03-15 NOTE — Clinical Social Work Note (Signed)
[facility] Patient is aware and patient's sister is aware as well. Patient to transport via EMS. [facility]. York SpanielMonica Aanya Haynes MSW,LCSW [phone]

## 2016-03-15 NOTE — Progress Notes (Signed)
Patient ID: [name], male   DOB: 05-Aug-1946, 70 y.o.   MRN: [id number] [facility]  [facility]   PATIENT NAME: [name]    MR#:  [id number]  DATE OF BIRTH:  05-Aug-1946  SUBJECTIVE:  No complaints. Pt has been calm per RN  REVIEW OF SYSTEMS:   Review of Systems  Constitutional: Negative for fever, chills and weight loss.  HENT: Negative for ear discharge, ear pain and nosebleeds.   Eyes: Negative for blurred vision, pain and discharge.  Respiratory: Negative for sputum production, shortness of breath, wheezing and stridor.   Cardiovascular: Negative for chest pain, palpitations, orthopnea and PND.  Gastrointestinal: Negative for nausea, vomiting, abdominal pain and diarrhea.  Genitourinary: Negative for urgency and frequency.  Musculoskeletal: Positive for back pain and joint pain.  Neurological: Positive for weakness. Negative for sensory change, speech change and focal weakness.  Psychiatric/Behavioral: Negative for depression and hallucinations. The patient is not nervous/anxious.    Tolerating Diet:yes Tolerating PT: reahb  DRUG ALLERGIES:  No Known Allergies  VITALS:  Blood pressure 129/62, pulse 91, temperature 97.5 F (36.4 C), temperature source Oral, resp. rate 18, height 5\' 11"  (1.803 m), weight 121.8 kg (268 lb 8.3 oz), SpO2 92 %.  PHYSICAL EXAMINATION:   Physical Exam  GENERAL:  70 y.o.-year-old patient lying in the bed with no acute distress. obese EYES: Pupils equal, round, reactive to light and accommodation. No scleral icterus. Extraocular muscles intact.  HEENT: Head atraumatic, normocephalic. Oropharynx and nasopharynx clear.  NECK:  Supple, no jugular venous distention. No thyroid enlargement, no tenderness.  LUNGS: Normal breath sounds bilaterally, no wheezing, rales, rhonchi. No use of accessory muscles of respiration.  CARDIOVASCULAR: S1, S2 normal. No murmurs, rubs, or gallops.  ABDOMEN: Soft, nontender,  nondistended. Bowel sounds present. No organomegaly or mass.  EXTREMITIES: No cyanosis, clubbing or edema b/l.   Bilateral LE dressing+ NEUROLOGIC: Cranial nerves II through XII are intact. No focal Motor or sensory deficits b/l.   PSYCHIATRIC:  patient is alert and oriented x 3.  SKIN: No obvious rash, lesion, or ulcer.   LABORATORY PANEL:  CBC  Recent Labs Lab 03/15/16 0410  WBC 3.4*  HGB 9.2*  HCT 27.7*  PLT 105*    Chemistries   Recent Labs Lab 03/15/16 0410  NA 141  K 3.7  CL 104  CO2 29  GLUCOSE 129*  BUN 51*  CREATININE 1.88*  CALCIUM 8.9  MG 1.8   Cardiac Enzymes No results for input(s): TROPONINI in the last 168 hours. RADIOLOGY:  No results found. ASSESSMENT AND PLAN:  [name]  is a 70 y.o. male with a known history of Peripheral vascular disease, type 2 diabetes, sleep apnea on CPAP, COPD, hypertension is admitted on surgical service with peripheral vascular disease and right lower extremity ulcer. Internal medicine was consulted for management of diabetes. It is been noted patient's sugars have been running anywhere from 70s to 100 100s.   1. Type 2 diabetes. Patient's sugars are very well controlled. We'll keep him on sliding scale. We'll DC his insulin  70/30. Please DO NOT resume insulin at discharge. Cont SSI  2. Hypertension continue home meds.  -resume home meds  3. Severe peripheral vascular disease per vascular surgery Patient underwent lower extremity PTCA and right superficial femoral and popliteal arteries along with PTA of right peroneal artery  4. COPD When necessary nebs continue oxygen as needed. Patient does not use chronic home oxygen.  5. DVT  prophylaxis subcutaneous Lovenox. Pt likely is returning back to [facility] today  Will sign off  Case discussed with Care Management/Social Worker. Management plans discussed with the patient, family and they are in agreement.  DVT Prophylaxis: per vascular  TOTAL TIME TAKING CARE OF  THIS PATIENT:25 minutes.  >50% time spent on counselling and coordination of care  Note: This dictation was prepared with Dragon dictation along with smaller phrase technology. Any transcriptional errors that result from this process are unintentional.  Alphonso Gregson M.D on 03/15/2016 at 11:49 AM  Between 7am to 6pm - Pager - [phone]  After 6pm go to [email or web address] - password [email or web address] [facility]  [place] [facility]  Office  [phone]  CC: Primary care physician; Dimple Casey, MD

## 2016-03-15 NOTE — Consult Note (Signed)
[facility] Face-to-Face Psychiatry Consult   Reason for Consult:  Consult for 70 year old man with a history of dementia currently in the hospital for vascular surgery intervention. Consult ordered because of agitated behavior yesterday Referring Physician:  Carroll Kinds Patient Identification: OTT ZIMMERLE MRN:  [id number] Principal Diagnosis: Delirium Diagnosis:   Patient Active Problem List   Diagnosis Date Noted  . Acute delirium [R41.0] 03/15/2016  . Pressure ulcer [L89.90] 03/14/2016  . Renal insufficiency [N28.9] 03/12/2016  . 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 Stage I pressure ulcer of right ankle [L89.511] 02/23/2016  . Stage I pressure ulcer of sacral region [L89.151] 02/23/2016  . Cellulitis [L03.90] 09/07/2015  . Sepsis ([place]) [A41.9] 09/05/2015    Total Time spent with patient: 45 minutes  Subjective:   [name] is a 70 y.o. male patient admitted with "I'm feeling a lot better today".  HPI:  Patient interviewed. Chart reviewed. Old notes from previous hospitalizations reviewed. Labs and vitals reviewed. 70 year old man in the hospital for vascular intervention that had resulted in some lower body pain. Patient was described as being combative and striking out at people yesterday which was not his normal state. On interview today the patient does not remember any of this. He states that he remembers having pain in his groin but it is much better today. He is not reporting any subjective mental health complaints today. He says his mood is feeling fine. He denies any thoughts of wanting to hurt anyone. Denies being aware of any hallucinations. He does have awareness of the fact that he has had chronic memory problems. I spoke with his sister as well she reports that he seems much better today. Yesterday he was having much more confusion and was frequently talking about people who were not present or were no longer alive whereas today his conversation  has been much more rational.  Social history: Patient is living at [facility]. His wife is also there. Sister is also close with him. No children. 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  Medical history: History of pressure ulcers diabetes COPD high blood pressure  Substance abuse history: Patient denies that he ever had any problem with drinking. Hasn't had any alcohol in a long time. Denies any drug abuse.  Past Psychiatric History: Patient has evidence in several of his prior hospitalizations and notes that he gets delirious pretty easily in the hospital. It looks like any kind of procedure or even brief hospitalization often results in him getting confused. He clearly has dementia which she is aware of in the family is aware of his well. No history of any other mental health problems identified. He is on Depakote but apparently that is not meant for a specifically mental health reason. He also takes sertraline. He is not aware of ever having had a diagnosis of depression. No history of suicide attempts  Risk to Self: Is patient at risk for suicide?: No Risk to Others:   Prior Inpatient Therapy:   Prior Outpatient Therapy:    Past Medical History:  Past Medical History  Diagnosis Date  . Bacteremia   . GERD (gastroesophageal reflux disease)   . Hematuria   . Urinary incontinence   . Sleep apnea     on CPAP  . Hyperlipemia   . 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 COPD (chronic obstructive pulmonary disease) ([place])   . Hypertension   . Lymphedema   . Anxiety  disorder   . CHF (congestive heart failure) (HCC)     diastolic dysfunction, last ECHO from 2015, EF 50-55%  . Muscle weakness   . Morbid obesity (HCC)   . Difficulty in walking     bedbound  . Chronic respiratory failure (HCC)     on 2l oxygen continuous  . Diabetes mellitus (HCC)   . Gout   . CKD (chronic kidney disease)   . Heel ulcer (HCC)   . Urinary retention      Past Surgical History  Procedure Laterality Date  . Coronary angioplasty with stent placement    . Hernia repair      hiatal hernia repair  . Coronary artery bypass graft    . Appendectomy    . Peripheral vascular catheterization Right 03/12/2016    Procedure: Lower Extremity Angiography;  Surgeon: Renford Dills, MD;  Location: [facility] INVASIVE CV LAB;  Service: Cardiovascular;  Laterality: Right;  . Peripheral vascular catheterization  03/12/2016    Procedure: Lower Extremity Intervention;  Surgeon: Renford Dills, MD;  Location: [facility] INVASIVE CV LAB;  Service: Cardiovascular;;   Family History:  Family History  Problem Relation Age of Onset  . Kidney disease Neg Hx   . Prostate cancer Neg Hx   . Alzheimer's disease Mother   . Heart attack Father    Family Psychiatric  History: Patient states he is not aware of there being any family history of mental illness Social History:  History  Alcohol Use No     History  Drug Use No    Social History   Social History  . Marital Status: Married    Spouse Name: N/A  . Number of Children: N/A  . Years of Education: N/A   Social History Main Topics  . Smoking status: Former Games developer  . Smokeless tobacco: None  . Alcohol Use: No  . Drug Use: No  . Sexual Activity: Not Asked   Other Topics Concern  . None   Social History Narrative   Additional Social History:    Allergies:  No Known Allergies  Labs:  Results for orders placed or performed during the hospital encounter of 03/12/16 (from the past 48 hour(s))  Basic metabolic panel     Status: Abnormal   Collection Time: 03/14/16  4:12 AM  Result Value Ref Range   Sodium 137 135 - 145 mmol/L   Potassium 3.6 3.5 - 5.1 mmol/L   Chloride 100 (L) 101 - 111 mmol/L   CO2 27 22 - 32 mmol/L   Glucose, Bld 109 (H) 65 - 99 mg/dL   BUN 51 (H) 6 - 20 mg/dL   Creatinine, Ser 8.08 (H) 0.61 - 1.24 mg/dL   Calcium 8.5 (L) 8.9 - 10.3 mg/dL   GFR calc non Af Amer 32 (L) >60 mL/min    GFR calc Af Amer 38 (L) >60 mL/min    Comment: (NOTE) The eGFR has been calculated using the CKD EPI equation. This calculation has not been validated in all clinical situations. eGFR's persistently <60 mL/min signify possible Chronic Kidney Disease.    Anion gap 10 5 - 15  Glucose, capillary     Status: None   Collection Time: 03/14/16  8:22 AM  Result Value Ref Range   Glucose-Capillary 99 65 - 99 mg/dL   Comment 1 Notify RN   Glucose, capillary     Status: Abnormal   Collection Time: 03/14/16 11:01 AM  Result Value Ref Range   Glucose-Capillary 104 (H)  65 - 99 mg/dL   Comment 1 Notify RN   Glucose, capillary     Status: Abnormal   Collection Time: 03/14/16  4:51 PM  Result Value Ref Range   Glucose-Capillary 130 (H) 65 - 99 mg/dL   Comment 1 Notify RN   Glucose, capillary     Status: Abnormal   Collection Time: 03/14/16  9:32 PM  Result Value Ref Range   Glucose-Capillary 146 (H) 65 - 99 mg/dL  CBC     Status: Abnormal   Collection Time: 03/15/16  4:10 AM  Result Value Ref Range   WBC 3.4 (L) 3.8 - 10.6 K/uL   RBC 3.10 (L) 4.40 - 5.90 MIL/uL   Hemoglobin 9.2 (L) 13.0 - 18.0 g/dL   HCT 27.7 (L) 40.0 - 52.0 %   MCV 89.3 80.0 - 100.0 fL   MCH 29.6 26.0 - 34.0 pg   MCHC 33.1 32.0 - 36.0 g/dL   RDW 14.5 11.5 - 14.5 %   Platelets 105 (L) 150 - 440 K/uL  Basic metabolic panel     Status: Abnormal   Collection Time: 03/15/16  4:10 AM  Result Value Ref Range   Sodium 141 135 - 145 mmol/L   Potassium 3.7 3.5 - 5.1 mmol/L   Chloride 104 101 - 111 mmol/L   CO2 29 22 - 32 mmol/L   Glucose, Bld 129 (H) 65 - 99 mg/dL   BUN 51 (H) 6 - 20 mg/dL   Creatinine, Ser 1.88 (H) 0.61 - 1.24 mg/dL   Calcium 8.9 8.9 - 10.3 mg/dL   GFR calc non Af Amer 34 (L) >60 mL/min   GFR calc Af Amer 40 (L) >60 mL/min    Comment: (NOTE) The eGFR has been calculated using the CKD EPI equation. This calculation has not been validated in all clinical situations. eGFR's persistently <60 mL/min signify  possible Chronic Kidney Disease.    Anion gap 8 5 - 15  Magnesium     Status: None   Collection Time: 03/15/16  4:10 AM  Result Value Ref Range   Magnesium 1.8 1.7 - 2.4 mg/dL  Glucose, capillary     Status: Abnormal   Collection Time: 03/15/16  7:36 AM  Result Value Ref Range   Glucose-Capillary 132 (H) 65 - 99 mg/dL   Comment 1 Notify RN   Glucose, capillary     Status: Abnormal   Collection Time: 03/15/16 11:36 AM  Result Value Ref Range   Glucose-Capillary 119 (H) 65 - 99 mg/dL    Current Facility-Administered Medications  Medication Dose Route Frequency Provider Last Rate Last Dose  . acetaminophen (TYLENOL) tablet 325-650 mg  325-650 mg Oral Q4H PRN Katha Cabal, MD       Or  . acetaminophen (TYLENOL) suppository 325-650 mg  325-650 mg Rectal Q4H PRN Katha Cabal, MD      . allopurinol (ZYLOPRIM) tablet 100 mg  100 mg Oral QHS Katha Cabal, MD   100 mg at 03/14/16 2144  . alum & mag hydroxide-simeth (MAALOX/MYLANTA) 200-200-20 MG/5ML suspension 15-30 mL  15-30 mL Oral Q2H PRN Katha Cabal, MD      . Chlorhexidine Gluconate Cloth 2 % PADS 6 each  6 each Topical Q0600 Katha Cabal, MD   6 each at 03/15/16 0600  . cholecalciferol (VITAMIN D) tablet 1,000 Units  1,000 Units Oral Daily Katha Cabal, MD   1,000 Units at 03/15/16 1027  . collagenase (SANTYL) ointment 1 application  1 application Topical Daily Katha Cabal, MD   1 application at 26/37/85 1645  . dextromethorphan (DELSYM) 30 MG/5ML liquid 60 mg  60 mg Oral Q12H PRN Katha Cabal, MD      . divalproex (DEPAKOTE) DR tablet 250 mg  250 mg Oral BID Katha Cabal, MD   250 mg at 03/15/16 1027  . finasteride (PROSCAR) tablet 5 mg  5 mg Oral Daily Katha Cabal, MD   5 mg at 03/15/16 1027  . fluconazole (DIFLUCAN) tablet 150 mg  150 mg Oral Q Thu Katha Cabal, MD   150 mg at 03/14/16 1132  . furosemide (LASIX) tablet 40 mg  40 mg Oral Daily Fritzi Mandes, MD   40 mg at 03/15/16  1027  . gabapentin (NEURONTIN) capsule 400 mg  400 mg Oral TID Katha Cabal, MD   400 mg at 03/15/16 1026  . guaiFENesin (MUCINEX) 12 hr tablet 600 mg  600 mg Oral Q12H Katha Cabal, MD   600 mg at 03/15/16 1026  . insulin aspart (novoLOG) injection 0-5 Units  0-5 Units Subcutaneous QHS Kimberly A Stegmayer, PA-C   0 Units at 03/14/16 2200  . insulin aspart (novoLOG) injection 0-9 Units  0-9 Units Subcutaneous TID WC Janalyn Harder Stegmayer, PA-C   1 Units at 03/15/16 [phone]  . ipratropium-albuterol (DUONEB) 0.5-2.5 (3) MG/3ML nebulizer solution 3 mL  3 mL Nebulization Q6H PRN Katha Cabal, MD      . loperamide (IMODIUM) capsule 2 mg  2 mg Oral Q4H PRN Katha Cabal, MD      . loratadine (CLARITIN) tablet 10 mg  10 mg Oral Daily Katha Cabal, MD   10 mg at 03/15/16 1027  . magnesium hydroxide (MILK OF MAGNESIA) suspension 30 mL  30 mL Oral Daily PRN Katha Cabal, MD      . metoprolol (LOPRESSOR) tablet 50 mg  50 mg Oral Daily Katha Cabal, MD   50 mg at 03/15/16 1026  . mupirocin ointment (BACTROBAN) 2 % 1 application  1 application Nasal BID Katha Cabal, MD   1 application at 27/74/12 1031  . pantoprazole (PROTONIX) EC tablet 40 mg  40 mg Oral Daily Katha Cabal, MD   40 mg at 03/15/16 1026  . sertraline (ZOLOFT) tablet 100 mg  100 mg Oral Daily Katha Cabal, MD   100 mg at 03/15/16 1027  . tamsulosin (FLOMAX) capsule 0.4 mg  0.4 mg Oral Daily Katha Cabal, MD   0.4 mg at 03/15/16 1026  . traMADol (ULTRAM) tablet 50 mg  50 mg Oral Q6H PRN Janalyn Harder Stegmayer, PA-C   50 mg at 03/14/16 1434  . traZODone (DESYREL) tablet 50 mg  50 mg Oral QHS Katha Cabal, MD   50 mg at 03/14/16 2144    Musculoskeletal: Strength & Muscle Tone: decreased Gait & Station: Did not ask him to stand. Patient leans: N/A  Psychiatric Specialty Exam: Review of Systems  Constitutional: Negative.   HENT: Negative.   Eyes: Negative.   Respiratory: Negative.    Cardiovascular: Negative.   Gastrointestinal: Negative.   Musculoskeletal: Negative.   Skin: Negative.   Neurological: Negative.   Psychiatric/Behavioral: Positive for memory loss. Negative for depression, suicidal ideas, hallucinations and substance abuse. The patient is not nervous/anxious and does not have insomnia.     Blood pressure 129/62, pulse 91, temperature 97.5 F (36.4 C), temperature source Oral, resp. rate 18, height '5\' 11"'$  (  1.803 m), weight 121.8 kg (268 lb 8.3 oz), SpO2 92 %.Body mass index is 37.47 kg/(m^2).  General Appearance: Fairly Groomed  Engineer, water::  Good  Speech:  Clear and Coherent  Volume:  Decreased  Mood:  Euthymic  Affect:  Appropriate and Constricted  Thought Process:  Goal Directed  Orientation:  Full (Time, Place, and Person)  Thought Content:  Negative  Suicidal Thoughts:  No  Homicidal Thoughts:  No  Memory:  Immediate;   Good Recent;   Fair Remote;   Fair  Judgement:  Fair  Insight:  Fair  Psychomotor Activity:  Negative  Concentration:  Fair  Recall:  [employer] of Knowledge:Fair  Language: Fair  Akathisia:  No  Handed:  Right  AIMS (if indicated):     Assets:  Communication Skills Desire for Improvement Financial Resources/Insurance Housing Resilience Social Support  ADL's:  Impaired  Cognition: Impaired,  Mild  Sleep:      Treatment Plan Summary: Plan 70 year old man who evidently was agitated delirious and striking out yesterday. Today all of that seems to have resolved. He was completely attentive and appropriate in conversation. He clearly has some chronic memory problems but is able to carry on a reasonable conversations and has some good quality of life remaining. Currently not delirious and not requiring any medication. Given his past history it's possible that he might have some "sundowning" or some periods of agitation if he has sickness again. We can make sure that there is a when necessary order for some very low dose of  haloperidol but I don't think he needs any changed any standing medicine. Supportive counseling and encouragement. Please call back if any further treatment or evaluation as needed. Thank you.  Disposition: Patient does not meet criteria for psychiatric inpatient admission. Supportive therapy provided about ongoing stressors.  Alethia Berthold, MD 03/15/2016 1:04 PM

## 2016-03-15 NOTE — Progress Notes (Signed)
[facility]  Daily Progress Note   Subjective: 3 Days Post-Op: Introduction catheter into right lower extremity 3rd order catheter placement, Contrast injection right lower extremity for distal runoff, Percutaneous transluminal angioplasty right superficial femoral and popliteal arteries to 5 mm with Lutonix, Crosser atherectomy right peroneal with percutaneous transluminal angioplasty right perineal to 3 mm, Star close closure left common femoral arteriotomy  Patient much calmer this afternoon. Acting and answering questions appropriately. As per psych - patient may of had some "sundowning" yesterday. Patient without complaint.   Objective: Filed Vitals:   03/14/16 1433 03/14/16 1954 03/15/16 0511 03/15/16 1026  BP: 116/60 114/63 127/56 129/62  Pulse: 94 85 93 91  Temp:  98 F (36.7 C) 98.1 F (36.7 C) 97.5 F (36.4 C)  TempSrc:  Oral Oral Oral  Resp:  18 18   Height:      Weight:      SpO2: 92% 100% 93% 92%    Intake/Output Summary (Last 24 hours) at 03/15/16 1355 Last data filed at 03/15/16 1024  Gross per 24 hour  Intake    119 ml  Output    950 ml  Net   -831 ml   Physical Exam: A&Ox3, NAD CV: RRR Pulmonary: CTA Bilaterally Abdomen: Soft, Nontender, Nondistended Groin: Hematoma stable. Ecchymosis noted. No active draining.  Vascular: Bilateral legs warm. Wound care dressing applied to ulcerations.   Laboratory: CBC    Component Value Date/Time   WBC 3.4* 03/15/2016 0410   WBC 5.2 11/26/2014 0459   HGB 9.2* 03/15/2016 0410   HGB 9.4* 11/26/2014 0459   HCT 27.7* 03/15/2016 0410   HCT 29.3* 11/26/2014 0459   PLT 105* 03/15/2016 0410   PLT 116* 11/26/2014 0459   BMET    Component Value Date/Time   NA 141 03/15/2016 0410   NA 144 11/26/2014 0459   K 3.7 03/15/2016 0410   K 3.8 11/26/2014 0459   CL 104 03/15/2016 0410   CL 106 11/26/2014 0459   CO2 29 03/15/2016 0410   CO2 33* 11/26/2014 0459   GLUCOSE 129* 03/15/2016 0410   GLUCOSE 108* 11/26/2014 0459   BUN 51* 03/15/2016 0410   BUN 13 11/26/2014 0459   CREATININE 1.88* 03/15/2016 0410   CREATININE 1.08 11/26/2014 0459   CALCIUM 8.9 03/15/2016 0410   CALCIUM 8.6 11/26/2014 0459   GFRNONAA 34* 03/15/2016 0410   GFRNONAA >60 11/26/2014 0459   GFRAA 40* 03/15/2016 0410   GFRAA >60 11/26/2014 0459   Assessment/Planning: 70 year old male s/p right lower extremity angiogram with hematoma s/p procedure - stable 1) Seen by medicine - asked not to resume insulin at discharge just sliding scale coverage. 2) Seen by psych - episode yesterday most likely sundowning - no new medications added 3) Hematoma stable. 4) OK to discharge back to [facility] today. 5) Discussed with Dr. Wallis Martew  Mykai Wendorf PA-C 03/15/2016 1:55 PM

## 2016-03-15 NOTE — Progress Notes (Signed)
Spoke with Hilda LiasMarie, [facility] rep at [phone], to notify of non-emergent EMS transport.  Auth notification reference given as [id number].   Service date range good from 03/15/16 - 06/13/16.   Gap exception requested to determine if services can be considered at an in-network level.

## 2016-03-15 NOTE — Discharge Instructions (Signed)
Patient already has pain script for oxycodone prior to inpatient stay. Modified to lower dosage. Highly recommend holding narcotics and treating pain with tylenol unless absolutely necessary.  Patient may shower. Medicine discontinued Novolog. Recommend finger sticks with sliding scale coverage.  Wound Care Consult / Recommendations: Stage 3 Pressure injury to left ischium Stage 3 pressure injury to sacrum Wound type:Pressure injury and mixed venous and arterial ulcers to lower legs and heels.  Pressure Ulcer POA: Yes Measurement Right heel: 3 cm x 4.2 cm x 0.3 cm ruddy red Coccyx: 5 cm x 5 cm x 0.2 cm  Dressing procedure/placement/frequency: Cleanse wounds to right heel with NS and pat gently dry. Apply Aquacel Ag to wound bed. Cover with 4x4 gauze and secure with kling and tape. Change daily.  Cleanse open areas to sacrum and left ischium with soap and water. Apply silicone foam dressing. Change every 3 days and PRN soilage.

## 2016-03-15 NOTE — Care Management (Signed)
[facility] put in error for transportation to SNF.  [facility] signing off

## 2016-03-22 ENCOUNTER — Encounter: Payer: Medicare Other | Attending: Surgery | Admitting: Surgery

## 2016-03-22 DIAGNOSIS — L97821 Non-pressure chronic ulcer of other part of left lower leg limited to breakdown of skin: Secondary | ICD-10-CM | POA: Diagnosis not present

## 2016-03-22 DIAGNOSIS — G4733 Obstructive sleep apnea (adult) (pediatric): Secondary | ICD-10-CM | POA: Diagnosis not present

## 2016-03-22 DIAGNOSIS — I70234 Atherosclerosis of native arteries of right leg with ulceration of heel and midfoot: Secondary | ICD-10-CM | POA: Diagnosis not present

## 2016-03-22 DIAGNOSIS — L89613 Pressure ulcer of right heel, stage 3: Secondary | ICD-10-CM | POA: Insufficient documentation

## 2016-03-22 DIAGNOSIS — M70871 Other soft tissue disorders related to use, overuse and pressure, right ankle and foot: Secondary | ICD-10-CM | POA: Insufficient documentation

## 2016-03-22 DIAGNOSIS — J449 Chronic obstructive pulmonary disease, unspecified: Secondary | ICD-10-CM | POA: Insufficient documentation

## 2016-03-22 DIAGNOSIS — M1990 Unspecified osteoarthritis, unspecified site: Secondary | ICD-10-CM | POA: Insufficient documentation

## 2016-03-22 DIAGNOSIS — E1140 Type 2 diabetes mellitus with diabetic neuropathy, unspecified: Secondary | ICD-10-CM | POA: Diagnosis not present

## 2016-03-22 DIAGNOSIS — I2510 Atherosclerotic heart disease of native coronary artery without angina pectoris: Secondary | ICD-10-CM | POA: Insufficient documentation

## 2016-03-22 DIAGNOSIS — I890 Lymphedema, not elsewhere classified: Secondary | ICD-10-CM | POA: Insufficient documentation

## 2016-03-22 DIAGNOSIS — Z6833 Body mass index (BMI) 33.0-33.9, adult: Secondary | ICD-10-CM | POA: Diagnosis not present

## 2016-03-22 DIAGNOSIS — M109 Gout, unspecified: Secondary | ICD-10-CM | POA: Insufficient documentation

## 2016-03-22 DIAGNOSIS — Z87891 Personal history of nicotine dependence: Secondary | ICD-10-CM | POA: Diagnosis not present

## 2016-03-22 DIAGNOSIS — E11621 Type 2 diabetes mellitus with foot ulcer: Secondary | ICD-10-CM | POA: Diagnosis not present

## 2016-03-22 DIAGNOSIS — I110 Hypertensive heart disease with heart failure: Secondary | ICD-10-CM | POA: Insufficient documentation

## 2016-03-22 DIAGNOSIS — I5032 Chronic diastolic (congestive) heart failure: Secondary | ICD-10-CM | POA: Insufficient documentation

## 2016-03-23 NOTE — Progress Notes (Signed)
[name], [name] ([id number]) Visit Report for 03/22/2016 Arrival Information Details Patient Name: [name], [name] Date of Service: 03/22/2016 8:00 AM Medical Record Number: [id number] Patient Account Number: [id number] Date of Birth/Sex: [birth year] (70 y.o. Male) Treating RN: Phillis Haggis Primary Care Physician: Oretha Milch Other Clinician: Referring Physician: Oretha Milch Treating Physician/Extender: Rudene Re in Treatment: 11 Visit Information History Since Last Visit All ordered tests and consults were completed: No Patient Arrived: Wheel Chair Added or deleted any medications: No Arrival Time: 08:11 Any new allergies or adverse reactions: No Accompanied By: self Had a fall or experienced change in No activities of daily living that may affect Transfer Assistance: Other risk of falls: Patient Identification Verified: Yes Signs or symptoms of abuse/neglect since last No Secondary Verification Process Yes visito Completed: Hospitalized since last visit: No Patient Requires Transmission-Based No Pain Present Now: No Precautions: Patient Has Alerts: Yes Patient Alerts: DM II Electronic Signature(s) Signed: 03/22/2016 5:12:54 PM By: Alejandro Mulling Entered By: Alejandro Mulling on 03/22/2016 08:11:43 [name] ([id number]) -------------------------------------------------------------------------------- Encounter Discharge Information Details Patient Name: [name] Date of Service: 03/22/2016 8:00 AM Medical Record Number: [id number] Patient Account Number: [id number] Date of Birth/Sex: 12-30-1945 (70 y.o. Male) Treating RN: Phillis Haggis Primary Care Physician: Oretha Milch Other Clinician: Referring Physician: Oretha Milch Treating Physician/Extender: Rudene Re in Treatment: 23 Encounter Discharge Information Items Discharge Pain Level: 0 Discharge Condition: Stable Ambulatory Status: Wheelchair Discharge Destination: Nursing  Home Transportation: Other Accompanied By: self Schedule Follow-up Appointment: Yes Medication Reconciliation completed and provided to Patient/Care Yes [name]: Provided on Clinical Summary of Care: 03/22/2016 Form Type Recipient Paper Patient EL Electronic Signature(s) Signed: 03/22/2016 5:12:54 PM By: Alejandro Mulling Previous Signature: 03/22/2016 9:26:01 AM Version By: Gwenlyn Perking Entered By: Alejandro Mulling on 03/22/2016 13:24:09 [name] ([id number]) -------------------------------------------------------------------------------- Lower Extremity Assessment Details Patient Name: [name] Date of Service: 03/22/2016 8:00 AM Medical Record Number: [id number] Patient Account Number: [id number] Date of Birth/Sex: 1945/12/22 (70 y.o. Male) Treating RN: Phillis Haggis Primary Care Physician: Oretha Milch Other Clinician: Referring Physician: Oretha Milch Treating Physician/Extender: Rudene Re in Treatment: 11 Edema Assessment Assessed: [Left: No] [Right: No] E[Left: dema] [Right: :] Calf Left: Right: Point of Measurement: 34 cm From Medial Instep 32.8 cm 30.5 cm Ankle Left: Right: Point of Measurement: 12 cm From Medial Instep 22 cm 23 cm Vascular Assessment Pulses: Posterior Tibial Dorsalis Pedis Palpable: [Left:Yes] [Right:Yes] Extremity colors, hair growth, and conditions: Extremity Color: [Left:Hyperpigmented] [Right:Hyperpigmented] Temperature of Extremity: [Left:Warm] [Right:Warm] Capillary Refill: [Left:> 3 seconds] [Right:> 3 seconds] Toe Nail Assessment Left: Right: Thick: Yes Yes Discolored: Yes Yes Deformed: Yes Yes Improper Length and Hygiene: Yes Yes Electronic Signature(s) Signed: 03/22/2016 5:12:54 PM By: Alejandro Mulling Entered By: Alejandro Mulling on 03/22/2016 08:24:39 [name], Sabri Seth Bake ([id number]) -------------------------------------------------------------------------------- Multi Wound Chart Details Patient Name:  [name] Date of Service: 03/22/2016 8:00 AM Medical Record Number: [id number] Patient Account Number: [id number] Date of Birth/Sex: 1946/05/17 (70 y.o. Male) Treating RN: Phillis Haggis Primary Care Physician: Oretha Milch Other Clinician: Referring Physician: Oretha Milch Treating Physician/Extender: Rudene Re in Treatment: 11 Vital Signs Height(in): 70 Pulse(bpm): 88 Weight(lbs): 235 Blood Pressure 108/61 (mmHg): Body Mass Index(BMI): 34 Temperature(F): Respiratory Rate 18 (breaths/min): Photos: [1:No Photos] [2:No Photos] [3:No Photos] Wound Location: [1:Left Lower Leg] [2:Right Lower Leg] [3:Right Calcaneus] Wounding Event: [1:Gradually Appeared] [2:Gradually Appeared] [3:Pressure Injury] Primary Etiology: [1:Venous Leg Ulcer] [2:Venous Leg Ulcer] [3:Pressure Ulcer] Comorbid History: [1:Cataracts, Chronic Obstructive Pulmonary Disease (COPD), Sleep Disease (  COPD), Sleep Disease (COPD), Sleep Apnea, Angina, Congestive Heart Failure, Congestive Heart Failure, Congestive Heart Failure, Hypertension, Type II Diabetes,  Gout, Osteoarthritis, Neuropathy Osteoarthritis, Neuropathy Osteoarthritis, Neuropathy] [2:Cataracts, Chronic Obstructive Pulmonary Apnea, Angina, Hypertension, Type II Diabetes, Gout,] [3:Cataracts, Chronic Obstructive Pulmonary Apnea, Angina,  Hypertension, Type II Diabetes, Gout,] Date Acquired: [1:12/31/2014] [2:12/31/2014] [3:10/31/2015] Weeks of Treatment: [1:11] [2:11] [3:11] Wound Status: [1:Open] [2:Open] [3:Open] Measurements L x W x D 3x3x0.1 [2:0.1x0.1x0.1] [3:2.4x3.3x0.2] (cm) Area (cm) : [1:7.069] [2:0.008] [3:6.22] Volume (cm) : [1:0.707] [2:0.001] [3:1.244] % Reduction in Area: [1:71.20%] [2:96.60%] [3:12.00%] % Reduction in Volume: 71.10% [2:95.80%] [3:12.00%] Classification: [1:Partial Thickness] [2:Partial Thickness] [3:Category/Stage III] HBO Classification: [1:Grade 1] [2:Grade 1] [3:Grade 1] Exudate Amount: [1:Large]  [2:None Present] [3:Large] Exudate Type: [1:Serous] [2:N/A] [3:Serous] Exudate Color: [1:amber] [2:N/A] [3:amber] Wound Margin: [1:Flat and Intact] [2:Flat and Intact] [3:Flat and Intact] Granulation Amount: [1:Large (67-100%)] [2:Large (67-100%)] [3:Large (67-100%)] Granulation Quality: [1:Pink] [2:Red] [3:Red, Pink] Necrotic Amount: [1:None Present (0%)] [2:None Present (0%)] [3:Small (1-33%)] Necrotic Tissue: N/A N/A Eschar, Adherent Slough Exposed Structures: Fascia: No Fascia: No Fascia: No Fat: No Fat: No Fat: No Tendon: No Tendon: No Tendon: No Muscle: No Muscle: No Muscle: No Joint: No Joint: No Joint: No Bone: No Bone: No Bone: No Limited to Skin Limited to Skin Limited to Skin Breakdown Breakdown Breakdown Epithelialization: Large (67-100%) Large (67-100%) None Periwound Skin Texture: Edema: Yes Edema: No Edema: Yes Periwound Skin Moist: Yes Moist: No Maceration: Yes Moisture: Moist: Yes Periwound Skin Color: Erythema: Yes Erythema: Yes Erythema: Yes Erythema Location: Circumferential Circumferential Circumferential Temperature: No Abnormality No Abnormality No Abnormality Tenderness on Yes Yes Yes Palpation: Wound Preparation: Ulcer Cleansing: Other: Ulcer Cleansing: Other: Ulcer Cleansing: soap and water soap and water Rinsed/Irrigated with Saline Topical Anesthetic Topical Anesthetic Applied: None Applied: None Topical Anesthetic Applied: Other: lidocaine 4% Wound Number: 6 7 N/A Photos: No Photos No Photos N/A Wound Location: Right Foot - Lateral Coccyx - Midline N/A Wounding Event: Pressure Injury Pressure Injury N/A Primary Etiology: Pressure Ulcer Pressure Ulcer N/A Comorbid History: Cataracts, Chronic Cataracts, Chronic N/A Obstructive Pulmonary Obstructive Pulmonary Disease (COPD), Sleep Disease (COPD), Sleep Apnea, Angina, Apnea, Angina, Congestive Heart Failure, Congestive Heart Failure, Hypertension, Type II Hypertension, Type  II Diabetes, Gout, Diabetes, Gout, Osteoarthritis, Neuropathy Osteoarthritis, Neuropathy Date Acquired: 02/16/2016 02/23/2016 N/A Weeks of Treatment: 5 4 N/A Wound Status: Open Open N/A Measurements L x W x D 0.1x0.1x0.1 2x2x0.1 N/A (cm) Area (cm) : 0.008 3.142 N/A Volume (cm) : 0.001 0.314 N/A % Reduction in Area: 94.90% 86.80% N/A % Reduction in Volume: 93.80% 86.80% N/A Classification: Category/Stage II Category/Stage II N/A HBO Classification: Grade 1 N/A N/A Helinski, Bryse V. ([id number]) Exudate Amount: Large Small N/A Exudate Type: Serosanguineous Serous N/A Exudate Color: red, brown amber N/A Wound Margin: Flat and Intact Flat and Intact N/A Granulation Amount: None Present (0%) Large (67-100%) N/A Granulation Quality: N/A Red N/A Necrotic Amount: Large (67-100%) None Present (0%) N/A Necrotic Tissue: Eschar, Adherent Slough N/A N/A Exposed Structures: Fascia: No Fascia: No N/A Fat: No Fat: No Tendon: No Tendon: No Muscle: No Muscle: No Joint: No Joint: No Bone: No Bone: No Limited to Skin Limited to Skin Breakdown Breakdown Epithelialization: None N/A N/A Periwound Skin Texture: No Abnormalities Noted Edema: No N/A Excoriation: No Induration: No Callus: No Crepitus: No Fluctuance: No Friable: No Rash: No Scarring: No Periwound Skin Moist: Yes Moist: Yes N/A Moisture: Maceration: No Dry/Scaly: No Periwound Skin Color: No Abnormalities Noted Atrophie Blanche: No N/A Cyanosis: No Ecchymosis:  No Erythema: No Hemosiderin Staining: No Mottled: No Pallor: No Rubor: No Erythema Location: N/A N/A N/A Temperature: No Abnormality No Abnormality N/A Tenderness on Yes Yes N/A Palpation: Wound Preparation: Ulcer Cleansing: Ulcer Cleansing: N/A Rinsed/Irrigated with Rinsed/Irrigated with Saline Saline Topical Anesthetic Topical Anesthetic Applied: Other: lidocaine Applied: None 4% Treatment Notes KANAN, SOBEK ([id number]) Electronic  Signature(s) Signed: 03/22/2016 5:12:54 PM By: Alejandro Mulling Entered By: Alejandro Mulling on 03/22/2016 08:55:38 [name] ([id number]) -------------------------------------------------------------------------------- Multi-Disciplinary Care Plan Details Patient Name: [name] Date of Service: 03/22/2016 8:00 AM Medical Record Number: [id number] Patient Account Number: [id number] Date of Birth/Sex: Dec 30, 1945 (70 y.o. Male) Treating RN: Phillis Haggis Primary Care Physician: Oretha Milch Other Clinician: Referring Physician: Oretha Milch Treating Physician/Extender: Rudene Re in Treatment: 11 Active Inactive Abuse / Safety / Falls / Self Care Management Nursing Diagnoses: Potential for falls Goals: Patient will remain injury free Date Initiated: 03/01/2016 Goal Status: Active Interventions: Assess fall risk on admission and as needed Notes: Nutrition Nursing Diagnoses: Imbalanced nutrition Goals: Patient/caregiver agrees to and verbalizes understanding of need to use nutritional supplements and/or vitamins as prescribed Date Initiated: 03/01/2016 Goal Status: Active Interventions: Assess patient nutrition upon admission and as needed per policy Notes: Orientation to the Wound Care Program Nursing Diagnoses: Knowledge deficit related to the wound healing center program Goals: Patient/caregiver will verbalize understanding of the [facility] [address] RIGDON, MACOMBER ([id number]) Date Initiated: 03/01/2016 Goal Status: Active Interventions: Provide education on orientation to the wound center Notes: Pain, Acute or Chronic Nursing Diagnoses: Pain, acute or chronic: actual or potential Potential alteration in comfort, pain Goals: Patient will verbalize adequate pain control and receive pain control interventions during procedures as needed Date Initiated: 03/01/2016 Goal Status: Active Patient/caregiver will verbalize adequate pain  control between visits Date Initiated: 03/01/2016 Goal Status: Active Interventions: Assess comfort goal upon admission Complete pain assessment as per visit requirements Notes: Pressure Nursing Diagnoses: Knowledge deficit related to causes and risk factors for pressure ulcer development Knowledge deficit related to management of pressures ulcers Goals: Patient/caregiver will verbalize risk factors for pressure ulcer development Date Initiated: 03/01/2016 Goal Status: Active Interventions: Assess offloading mechanisms upon admission and as needed Notes: Wound/Skin Impairment Nursing Diagnoses: JACQUESE, HACKMAN ([id number]) Impaired tissue integrity Goals: Ulcer/skin breakdown will have a volume reduction of 30% by week 4 Date Initiated: 03/01/2016 Goal Status: Active Ulcer/skin breakdown will have a volume reduction of 50% by week 8 Date Initiated: 03/01/2016 Goal Status: Active Ulcer/skin breakdown will have a volume reduction of 80% by week 12 Date Initiated: 03/01/2016 Goal Status: Active Interventions: Assess ulceration(s) every visit Notes: Electronic Signature(s) Signed: 03/22/2016 5:12:54 PM By: Alejandro Mulling Entered By: Alejandro Mulling on 03/22/2016 08:55:29 Kunka, Jayquan VMarland Kitchen ([id number]) -------------------------------------------------------------------------------- Pain Assessment Details Patient Name: [name] Date of Service: 03/22/2016 8:00 AM Medical Record Number: [id number] Patient Account Number: [id number] Date of Birth/Sex: 1946/03/20 (70 y.o. Male) Treating RN: Phillis Haggis Primary Care Physician: Oretha Milch Other Clinician: Referring Physician: Oretha Milch Treating Physician/Extender: Rudene Re in Treatment: 11 Active Problems Location of Pain Severity and Description of Pain Patient Has Paino No Site Locations Pain Management and Medication Current Pain Management: Electronic Signature(s) Signed: 03/22/2016 5:12:54 PM  By: Alejandro Mulling Entered By: Alejandro Mulling on 03/22/2016 08:11:49 [name] ([id number]) -------------------------------------------------------------------------------- Patient/Caregiver Education Details Patient Name: [name] Date of Service: 03/22/2016 8:00 AM Medical Record Number: [id number] Patient Account Number: [id number] Date of Birth/Gender: 03/26/1946 (70 y.o. Male) Treating RN: Ashok Cordia, [place]  Primary Care Physician: Oretha MilchSMITH, [name] Other Clinician: Referring Physician: Oretha MilchSMITH, [name] Treating Physician/Extender: Rudene ReBritto, Errol Weeks in Treatment: 11 Education Assessment Education Provided To: Patient Education Topics Provided Wound/Skin Impairment: Handouts: Other: do not get wraps wet Methods: Demonstration, Explain/Verbal Responses: State content correctly Electronic Signature(s) Signed: 03/22/2016 5:12:54 PM By: Alejandro MullingPinkerton, Debra Entered By: Alejandro MullingPinkerton, Debra on 03/22/2016 14:07:42 Holdman, Braylin V. ([id number]) -------------------------------------------------------------------------------- Wound Assessment Details Patient Name: [name] OttoLINDLEY, Waqas V. Date of Service: 03/22/2016 8:00 AM Medical Record Number: [id number] Patient Account Number: [id number] Date of Birth/Sex: [birth year](70 y.o. Male) Treating RN: Phillis HaggisPinkerton, Debi Primary Care Physician: Oretha MilchSMITH, [name] Other Clinician: Referring Physician: Oretha MilchSMITH, [name] Treating Physician/Extender: Rudene ReBritto, Errol Weeks in Treatment: 11 Wound Status Wound Number: 1 Primary Venous Leg Ulcer Etiology: Wound Location: Left Lower Leg Wound Open Wounding Event: Gradually Appeared Status: Date Acquired: 12/31/2014 Comorbid Cataracts, Chronic Obstructive Weeks Of Treatment: 11 History: Pulmonary Disease (COPD), Sleep Clustered Wound: No Apnea, Angina, Congestive Heart Failure, Hypertension, Type II Diabetes, Gout, Osteoarthritis, Neuropathy Photos Photo Uploaded By: Alejandro MullingPinkerton, Debra on 03/22/2016  16:57:48 Wound Measurements Length: (cm) 3 Width: (cm) 3 Depth: (cm) 0.1 Area: (cm) 7.069 Volume: (cm) 0.707 % Reduction in Area: 71.2% % Reduction in Volume: 71.1% Epithelialization: Large (67-100%) Tunneling: No Undermining: No Wound Description Classification: Partial Thickness Foul Odor A Diabetic Severity (Wagner): Grade 1 Wound Margin: Flat and Intact Exudate Amount: Large Exudate Type: Serous Exudate Color: amber fter Cleansing: No Wound Bed Granulation Amount: Large (67-100%) Exposed Structure Plott, Mavric V. ([id number]) Granulation Quality: Pink Fascia Exposed: No Necrotic Amount: None Present (0%) Fat Layer Exposed: No Tendon Exposed: No Muscle Exposed: No Joint Exposed: No Bone Exposed: No Limited to Skin Breakdown Periwound Skin Texture Texture Color No Abnormalities Noted: No No Abnormalities Noted: No Localized Edema: Yes Erythema: Yes Erythema Location: Circumferential Moisture No Abnormalities Noted: No Temperature / Pain Moist: Yes Temperature: No Abnormality Tenderness on Palpation: Yes Wound Preparation Ulcer Cleansing: Other: soap and water, Topical Anesthetic Applied: None Treatment Notes Wound #1 (Left Lower Leg) 1. Cleansed with: Cleanse wound with antibacterial soap and water 3. Peri-wound Care: Barrier cream 4. Dressing Applied: Aquacel Ag 5. Secondary Dressing Applied ABD Pad 7. Secured with Tape 2 Layer Lite Compression System - Bilateral Electronic Signature(s) Signed: 03/22/2016 5:12:54 PM By: Alejandro MullingPinkerton, Debra Entered By: Alejandro MullingPinkerton, Debra on 03/22/2016 08:33:00 [name] OttoLINDLEY, Meredith V. ([id number]) -------------------------------------------------------------------------------- Wound Assessment Details Patient Name: [name] OttoLINDLEY, Archer V. Date of Service: 03/22/2016 8:00 AM Medical Record Number: [id number] Patient Account Number: [id number] Date of Birth/Sex: [birth year] 38(70 y.o. Male) Treating RN: Phillis HaggisPinkerton, Debi Primary Care  Physician: Oretha MilchSMITH, [name] Other Clinician: Referring Physician: Oretha MilchSMITH, [name] Treating Physician/Extender: Rudene ReBritto, Errol Weeks in Treatment: 11 Wound Status Wound Number: 2 Primary Venous Leg Ulcer Etiology: Wound Location: Right Lower Leg Wound Open Wounding Event: Gradually Appeared Status: Date Acquired: 12/31/2014 Comorbid Cataracts, Chronic Obstructive Weeks Of Treatment: 11 History: Pulmonary Disease (COPD), Sleep Clustered Wound: No Apnea, Angina, Congestive Heart Failure, Hypertension, Type II Diabetes, Gout, Osteoarthritis, Neuropathy Photos Photo Uploaded By: Alejandro MullingPinkerton, Debra on 03/22/2016 16:57:48 Wound Measurements Length: (cm) 0.1 Width: (cm) 0.1 Depth: (cm) 0.1 Area: (cm) 0.008 Volume: (cm) 0.001 % Reduction in Area: 96.6% % Reduction in Volume: 95.8% Epithelialization: Large (67-100%) Tunneling: No Undermining: No Wound Description Classification: Partial Thickness Foul Odor A Diabetic Severity (Wagner): Grade 1 Wound Margin: Flat and Intact Exudate Amount: None Present fter Cleansing: No Wound Bed Granulation Amount: Large (67-100%) Exposed Structure Granulation Quality: Red Fascia Exposed: No Necrotic Amount: None Present (0%) Fat Layer Exposed: No Poli,  Lynkin V. ([id number]) Tendon Exposed: No Muscle Exposed: No Joint Exposed: No Bone Exposed: No Limited to Skin Breakdown Periwound Skin Texture Texture Color No Abnormalities Noted: No No Abnormalities Noted: No Localized Edema: No Erythema: Yes Erythema Location: Circumferential Moisture No Abnormalities Noted: No Temperature / Pain Moist: No Temperature: No Abnormality Tenderness on Palpation: Yes Wound Preparation Ulcer Cleansing: Other: soap and water, Topical Anesthetic Applied: None Treatment Notes Wound #2 (Right Lower Leg) 1. Cleansed with: Cleanse wound with antibacterial soap and water 3. Peri-wound Care: Barrier cream 4. Dressing Applied: Aquacel Ag 5. Secondary  Dressing Applied ABD Pad 7. Secured with Tape 2 Layer Lite Compression System - Bilateral Electronic Signature(s) Signed: 03/22/2016 5:12:54 PM By: Alejandro Mulling Entered By: Alejandro Mulling on 03/22/2016 08:33:27 [name] ([id number]) -------------------------------------------------------------------------------- Wound Assessment Details Patient Name: [name] Date of Service: 03/22/2016 8:00 AM Medical Record Number: [id number] Patient Account Number: [id number] Date of Birth/Sex: [birth year] (70 y.o. Male) Treating RN: Phillis Haggis Primary Care Physician: Oretha Milch Other Clinician: Referring Physician: Oretha Milch Treating Physician/Extender: Rudene Re in Treatment: 11 Wound Status Wound Number: 3 Primary Pressure Ulcer Etiology: Wound Location: Right Calcaneus Wound Open Wounding Event: Pressure Injury Status: Date Acquired: 10/31/2015 Comorbid Cataracts, Chronic Obstructive Weeks Of Treatment: 11 History: Pulmonary Disease (COPD), Sleep Clustered Wound: No Apnea, Angina, Congestive Heart Failure, Hypertension, Type II Diabetes, Gout, Osteoarthritis, Neuropathy Photos Photo Uploaded By: Alejandro Mulling on 03/22/2016 16:57:55 Wound Measurements Length: (cm) 2.4 Width: (cm) 3.3 Depth: (cm) 0.2 Area: (cm) 6.22 Volume: (cm) 1.244 % Reduction in Area: 12% % Reduction in Volume: 12% Epithelialization: None Tunneling: No Undermining: No Wound Description Classification: Category/Stage III Foul Odor Afte Diabetic Severity (Wagner): Grade 1 Wound Margin: Flat and Intact Exudate Amount: Large Exudate Type: Serous Exudate Color: amber r Cleansing: No Wound Bed Granulation Amount: Large (67-100%) Exposed Structure Craighead, Valdis V. ([id number]) Granulation Quality: Red, Pink Fascia Exposed: No Necrotic Amount: Small (1-33%) Fat Layer Exposed: No Necrotic Quality: Eschar, Adherent Slough Tendon Exposed: No Muscle Exposed:  No Joint Exposed: No Bone Exposed: No Limited to Skin Breakdown Periwound Skin Texture Texture Color No Abnormalities Noted: No No Abnormalities Noted: No Localized Edema: Yes Erythema: Yes Erythema Location: Circumferential Moisture No Abnormalities Noted: No Temperature / Pain Maceration: Yes Temperature: No Abnormality Moist: Yes Tenderness on Palpation: Yes Wound Preparation Ulcer Cleansing: Rinsed/Irrigated with Saline Topical Anesthetic Applied: Other: lidocaine 4%, Treatment Notes Wound #3 (Right Calcaneus) 1. Cleansed with: Cleanse wound with antibacterial soap and water 2. Anesthetic Topical Lidocaine 4% cream to wound bed prior to debridement 3. Peri-wound Care: Barrier cream 4. Dressing Applied: Santyl Ointment 5. Secondary Dressing Applied ABD Pad Dry Gauze Kerlix/Conform 7. Secured with Secretary/administrator) Signed: 03/22/2016 5:12:54 PM By: Alejandro Mulling Entered By: Alejandro Mulling on 03/22/2016 08:36:00 [name] ([id number]) -------------------------------------------------------------------------------- Wound Assessment Details Patient Name: [name] Date of Service: 03/22/2016 8:00 AM Medical Record Number: [id number] Patient Account Number: [id number] Date of Birth/Sex: 15-May-1946 (70 y.o. Male) Treating RN: Phillis Haggis Primary Care Physician: Oretha Milch Other Clinician: Referring Physician: Oretha Milch Treating Physician/Extender: Rudene Re in Treatment: 11 Wound Status Wound Number: 6 Primary Pressure Ulcer Etiology: Wound Location: Right Foot - Lateral Wound Open Wounding Event: Pressure Injury Status: Date Acquired: 02/16/2016 Comorbid Cataracts, Chronic Obstructive Weeks Of Treatment: 5 History: Pulmonary Disease (COPD), Sleep Clustered Wound: No Apnea, Angina, Congestive Heart Failure, Hypertension, Type II Diabetes, Gout, Osteoarthritis, Neuropathy Wound Measurements Length: (cm)  0.1 Width: (cm) 0.1 Depth: (cm)  0.1 Area: (cm) 0.008 Volume: (cm) 0.001 % Reduction in Area: 94.9% % Reduction in Volume: 93.8% Epithelialization: None Tunneling: No Undermining: No Wound Description Classification: Category/Stage II Foul Odor Aft Diabetic Severity (Wagner): Grade 1 Wound Margin: Flat and Intact Exudate Amount: Large Exudate Type: Serosanguineous Exudate Color: red, brown er Cleansing: No Wound Bed Granulation Amount: None Present (0%) Exposed Structure Necrotic Amount: Large (67-100%) Fascia Exposed: No Necrotic Quality: Eschar, Adherent Slough Fat Layer Exposed: No Tendon Exposed: No Muscle Exposed: No Joint Exposed: No Bone Exposed: No Limited to Skin Breakdown Periwound Skin Texture Texture Color No Abnormalities Noted: No No Abnormalities Noted: No [place]atwood, Rajiv V. ([id number]) Moisture Temperature / Pain No Abnormalities Noted: No Temperature: No Abnormality Moist: Yes Tenderness on Palpation: Yes Wound Preparation Ulcer Cleansing: Rinsed/Irrigated with Saline Topical Anesthetic Applied: Other: lidocaine 4%, Treatment Notes Wound #6 (Right, Lateral Foot) 1. Cleansed with: Cleanse wound with antibacterial soap and water 3. Peri-wound Care: Barrier cream 4. Dressing Applied: Aquacel Ag 5. Secondary Dressing Applied ABD Pad 7. Secured with Tape 2 Layer Lite Compression System - Bilateral Electronic Signature(s) Signed: 03/22/2016 5:12:54 PM By: Alejandro Mulling Entered By: Alejandro Mulling on 03/22/2016 08:38:06 [name] ([id number]) -------------------------------------------------------------------------------- Wound Assessment Details Patient Name: [name] Date of Service: 03/22/2016 8:00 AM Medical Record Number: [id number] Patient Account Number: [id number] Date of Birth/Sex: 01-23-1946 (70 y.o. Male) Treating RN: Phillis Haggis Primary Care Physician: Oretha Milch Other Clinician: Referring Physician:  Oretha Milch Treating Physician/Extender: Rudene Re in Treatment: 11 Wound Status Wound Number: 7 Primary Pressure Ulcer Etiology: Wound Location: Coccyx - Midline Wound Open Wounding Event: Pressure Injury Status: Date Acquired: 02/23/2016 Comorbid Cataracts, Chronic Obstructive Weeks Of Treatment: 4 History: Pulmonary Disease (COPD), Sleep Clustered Wound: No Apnea, Angina, Congestive Heart Failure, Hypertension, Type II Diabetes, Gout, Osteoarthritis, Neuropathy Wound Measurements Length: (cm) 2 Width: (cm) 2 Depth: (cm) 0.1 Area: (cm) 3.142 Volume: (cm) 0.314 % Reduction in Area: 86.8% % Reduction in Volume: 86.8% Tunneling: No Undermining: No Wound Description Classification: Category/Stage II Foul Odor Afte Wound Margin: Flat and Intact Exudate Amount: Small Exudate Type: Serous Exudate Color: amber r Cleansing: No Wound Bed Granulation Amount: Large (67-100%) Exposed Structure Granulation Quality: Red Fascia Exposed: No Necrotic Amount: None Present (0%) Fat Layer Exposed: No Tendon Exposed: No Muscle Exposed: No Joint Exposed: No Bone Exposed: No Limited to Skin Breakdown Periwound Skin Texture Texture Color No Abnormalities Noted: No No Abnormalities Noted: No Callus: No Atrophie Blanche: No Sayegh, Ayren V. ([id number]) Crepitus: No Cyanosis: No Excoriation: No Ecchymosis: No Fluctuance: No Erythema: No Friable: No Hemosiderin Staining: No Induration: No Mottled: No Localized Edema: No Pallor: No Rash: No Rubor: No Scarring: No Temperature / Pain Moisture Temperature: No Abnormality No Abnormalities Noted: No Tenderness on Palpation: Yes Dry / Scaly: No Maceration: No Moist: Yes Wound Preparation Ulcer Cleansing: Rinsed/Irrigated with Saline Topical Anesthetic Applied: None Treatment Notes Wound #7 (Midline Coccyx) 1. Cleansed with: Clean wound with Normal Saline 3. Peri-wound Care: Barrier cream Skin Prep 4.  Dressing Applied: Aquacel Ag 5. Secondary Dressing Applied Bordered Foam Dressing Electronic Signature(s) Signed: 03/22/2016 5:12:54 PM By: Alejandro Mulling Entered By: Alejandro Mulling on 03/22/2016 08:30:18 [name] ([id number]) -------------------------------------------------------------------------------- Vitals Details Patient Name: [name] Date of Service: 03/22/2016 8:00 AM Medical Record Number: [id number] Patient Account Number: [id number] Date of Birth/Sex: 18-Apr-1946 (69 y.o. Male) Treating RN: Phillis Haggis Primary Care Physician: Oretha Milch Other Clinician: Referring Physician: Oretha Milch Treating Physician/Extender: Rudene Re in Treatment:  11 Vital Signs Time Taken: 08:11 Pulse (bpm): 88 Height (in): 70 Respiratory Rate (breaths/min): 18 Weight (lbs): 235 Blood Pressure (mmHg): 108/61 Body Mass Index (BMI): 33.7 Reference Range: 80 - 120 mg / dl Electronic Signature(s) Signed: 03/22/2016 5:12:54 PM By: Alejandro Mulling Entered By: Alejandro Mulling on 03/22/2016 08:12:05

## 2016-03-23 NOTE — Progress Notes (Signed)
MATTHER, LABELL ([id number]) Visit Report for 03/22/2016 Chief Complaint Document Details Patient Name: [name], [name] Date of Service: 03/22/2016 8:00 AM Medical Record Number: [id number] Patient Account Number: [id number] Date of Birth/Sex: [birth year] (70 y.o. Male) Treating RN: Phillis Haggis Primary Care Physician: Oretha Milch Other Clinician: Referring Physician: Oretha Milch Treating Physician/Extender: Rudene Re in Treatment: 11 Information Obtained from: Patient Chief Complaint Patient is at the clinic for treatment of an open pressure ulcer the left upper thigh and gluteal region and the right heel with bilateral swelling of the legs all of it which is going on for over a year Electronic Signature(s) Signed: 03/22/2016 9:04:28 AM By: Evlyn Kanner MD, FACS Entered By: Evlyn Kanner on 03/22/2016 09:04:28 [name] ([id number]) -------------------------------------------------------------------------------- Debridement Details Patient Name: [name] Date of Service: 03/22/2016 8:00 AM Medical Record Number: [id number] Patient Account Number: [id number] Date of Birth/Sex: 10/27/1946 (70 y.o. Male) Treating RN: Phillis Haggis Primary Care Physician: Oretha Milch Other Clinician: Referring Physician: Oretha Milch Treating Physician/Extender: Rudene Re in Treatment: 11 Debridement Performed for Wound #6 Right,Lateral Foot Assessment: Performed By: Physician Evlyn Kanner, MD Debridement: Debridement Pre-procedure Yes Verification/Time Out Taken: Start Time: 08:56 Pain Control: Lidocaine 4% Topical Solution Level: Skin/Subcutaneous Tissue Total Area Debrided (L x 0.1 (cm) x 0.1 (cm) = 0.01 (cm) W): Tissue and other Viable, Non-Viable, Exudate, Fibrin/Slough, Subcutaneous material debrided: Instrument: Forceps, Scissors Bleeding: Minimum Hemostasis Achieved: Pressure End Time: 08:58 Procedural Pain: 0 Post Procedural Pain:  0 Response to Treatment: Procedure was tolerated well Post Debridement Measurements of Total Wound Length: (cm) 0.5 Stage: Category/Stage II Width: (cm) 0.5 Depth: (cm) 0.2 Volume: (cm) 0.039 Post Procedure Diagnosis Same as Pre-procedure Electronic Signature(s) Signed: 03/22/2016 9:04:03 AM By: Evlyn Kanner MD, FACS Signed: 03/22/2016 5:12:54 PM By: Alejandro Mulling Entered By: Evlyn Kanner on 03/22/2016 09:04:03 [name] ([id number]) -------------------------------------------------------------------------------- HPI Details Patient Name: [name] Date of Service: 03/22/2016 8:00 AM Medical Record Number: [id number] Patient Account Number: [id number] Date of Birth/Sex: 1946/02/23 (70 y.o. Male) Treating RN: Phillis Haggis Primary Care Physician: Oretha Milch Other Clinician: Referring Physician: Oretha Milch Treating Physician/Extender: Rudene Re in Treatment: 11 History of Present Illness Location: ulcerated area on the right heel, left gluteal region and thigh and then bilateral lower extremities Quality: Patient reports experiencing a sharp pain to affected area(s). Severity: Patient states wound are getting worse. Duration: Patient has had the wound for > 12 months prior to seeking treatment at the wound center Timing: Pain in wound is constant (hurts all the time) Context: The wound appeared gradually over time Modifying Factors: Other treatment(s) tried include: he sees his heart doctor and his primary care doctor Associated Signs and Symptoms: patient has not been able to walk for over a year now HPI Description: 70 year old gentleman with a known history of hypertension, diabetes, obstructive sleep apnea, COPD, diastolic CHF, coronary artery disease was admitted to the hospital with sepsis from an ulcer of the right heel and was treated there in October 2016. he also has chronic bilateral lower extremity edema and lymphedema. He had received  vancomycin, Zosyn and at that stage and x-rays showed hardware in the right ankle but no evidence of osteomyelitis. he was a former smoker. he was also treated with Augmentin orally for 10 days. He is either bedbound or wheelchair-bound and does not ambulate by himself. 01/19/2016 -- he has not been seen here for 3 weeks and this was because he was admitted to [facility] between 223 and 01/08/2016 for sepsis, UTI and pneumonia involving the left lung. he was treated with IV vancomycin and Zosyn and then meropenem. Was discharged home on oral Bactrim for 2 weeks none of his vascular test or x-rays were done and we will reorder these. 01/26/2016 -- he has not yet done the x-ray of his foot and is vascular tests are still pending. I have asked him to work on these with his nursing home staff. Addendum: he has got an x-ray of the right foot done which shows that his osteopenia but no specific ostial lysis or abnormal periosteal reaction. Final impression was degenerative changes with dorsal foot soft tissue swelling. 02/16/2016 -- lower extremity arterial duplex examination shows a 50-99% stenosis of the right tibioperoneal trunk. He had biphasic flow in the right SFA, popliteal and tibioperoneal trunk. Left-sided he had triphasic flow throughout 02/29/2016 -- he is awaiting his vascular opinion with Dr. Gilda Crease which is to be done on April 24 03/08/2016 -- he has not kept his appointment with Dr. Gilda Crease on April 24 and does not seem to know what happened about this. it's difficult to gauge whether he is in full control of his mental faculties as at times he is extremely rude to the nursing staff. 03/22/2016 -- the patient was seen by Dr. 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 Recommended that the patient had severe atherosclerotic changes of both lower  extremities associated with ulceration and tissue loss of the foot. This is a limb threatening ischemia and the patient was recommended to undergo Weygandt, Nicolai V. ([id number]) angiography of the lower extremity with a hope for intervention for limb salvage. Patient agreed and will proceed to angiography. He was admitted to the hospital between May 2 and 03/15/2016 - he underwent induction of a catheter into his right lower extremity and third order catheter placement with contrast injection to the right lower extremity for distal runoff. Percutaneous transluminal angioplasty of the right superficial femoral and popliteal arteries were done. He also had a right peroneal angioplasty. Patient had a postoperative hematoma and was admitted for observation and in the next 24 hours he was very agitated and combative and had to be seen by psychiatric. He had a follow-up with Dr. Gilda Crease in 3 weeks Electronic Signature(s) Signed: 03/22/2016 9:04:56 AM By: Evlyn Kanner MD, FACS Entered By: Evlyn Kanner on 03/22/2016 09:04:56 [name] ([id number]) -------------------------------------------------------------------------------- Physical Exam Details Patient Name: [name] Date of Service: 03/22/2016 8:00 AM Medical Record Number: [id number] Patient Account Number: [id number] Date of Birth/Sex: 04/28/1946 (70 y.o. Male) Treating RN: Phillis Haggis Primary Care Physician: Oretha Milch Other Clinician: Referring Physician: Oretha Milch Treating Physician/Extender: Rudene Re in Treatment: 11 Constitutional . Pulse regular. Respirations normal and unlabored. Afebrile. . Eyes Nonicteric. Reactive to light. Ears, Nose, Mouth, and Throat Lips, teeth, and gums WNL.Marland Kitchen Moist mucosa without lesions. Neck supple and nontender. No palpable supraclavicular or cervical adenopathy. Normal sized without goiter. Respiratory WNL. No retractions.. Cardiovascular Pedal Pulses WNL. No  clubbing, cyanosis or edema. Lymphatic No adneopathy. No adenopathy. No adenopathy. Musculoskeletal Adexa without tenderness or enlargement.. Digits and nails w/o clubbing, cyanosis, infection, petechiae, ischemia, or inflammatory conditions.. Integumentary (Hair, Skin) No suspicious lesions. No crepitus or fluctuance. No peri-wound warmth or erythema. No masses.Marland Kitchen Psychiatric Judgement and insight Intact.. No evidence of depression, anxiety, or agitation.. Notes the lymphedema has come down  significantly and there are tiny open wounds both lower extremities. The right heel continues to have significant granulation tissue and is looking much better than before. The right lateral foot had a fairly big eschar which when removed has an open ulceration and this has been reestablished as a wound today. Electronic Signature(s) Signed: 03/22/2016 9:06:38 AM By: Evlyn Kanner MD, FACS Entered By: Evlyn Kanner on 03/22/2016 09:06:37 [name] ([id number]) -------------------------------------------------------------------------------- Physician Orders Details Patient Name: [name] Date of Service: 03/22/2016 8:00 AM Medical Record Number: [id number] Patient Account Number: [id number] Date of Birth/Sex: 19-Jun-1946 (70 y.o. Male) Treating RN: Phillis Haggis Primary Care Physician: Oretha Milch Other Clinician: Referring Physician: Oretha Milch Treating Physician/Extender: Rudene Re in Treatment: 31 Verbal / Phone Orders: Yes Clinician: Ashok Cordia, Debi Read Back and Verified: Yes Diagnosis Coding Wound Cleansing Wound #1 Left Lower Leg o May shower with protection. - please cover wraps and keep dry o No tub bath. Wound #2 Right Lower Leg o May shower with protection. - please cover wraps and keep dry o No tub bath. Wound #3 Right Calcaneus o May shower with protection. - please cover wraps and keep dry o No tub bath. Wound #6 Right,Lateral Foot o  May shower with protection. - please cover wraps and keep dry o No tub bath. Wound #7 Midline Coccyx o May shower with protection. - please cover wraps and keep dry o No tub bath. Anesthetic Wound #1 Left Lower Leg o Topical Lidocaine 4% cream applied to wound bed prior to debridement Wound #6 Right,Lateral Foot o Topical Lidocaine 4% cream applied to wound bed prior to debridement Wound #2 Right Lower Leg o Topical Lidocaine 4% cream applied to wound bed prior to debridement Wound #3 Right Calcaneus o Topical Lidocaine 4% cream applied to wound bed prior to debridement Wound #7 Midline Coccyx o Topical Lidocaine 4% cream applied to wound bed prior to debridement Miltner, Keyion V. ([id number]) Skin Barriers/Peri-Wound Care Wound #1 Left Lower Leg o Barrier cream Wound #6 Right,Lateral Foot o Barrier cream Wound #2 Right Lower Leg o Barrier cream Wound #3 Right Calcaneus o Barrier cream Wound #7 Midline Coccyx o Barrier cream Primary Wound Dressing Wound #1 Left Lower Leg o Aquacel Ag - ******IF THE PT MISSES HIS APPT FOR THE WEEK NURSING AT THE FACILITY NEEDS TO CHANGE WRAPS AND DRESSINGS ON LEGS AND FOOT***** Wound #2 Right Lower Leg o Aquacel Ag - ******IF THE PT MISSES HIS APPT FOR THE WEEK NURSING AT THE FACILITY NEEDS TO CHANGE WRAPS AND DRESSINGS ON LEGS AND FOOT***** Wound #6 Right,Lateral Foot o Aquacel Ag - ******IF THE PT MISSES HIS APPT FOR THE WEEK NURSING AT THE FACILITY NEEDS TO CHANGE WRAPS AND DRESSINGS ON LEGS AND FOOT***** Wound #3 Right Calcaneus o Santyl Ointment - ******IF THE PT MISSES HIS APPT FOR THE WEEK NURSING AT THE FACILITY NEEDS TO CHANGE WRAPS AND DRESSINGS ON LEGS AND FOOT***** Wound #7 Midline Coccyx o Aquacel Ag - ******IF THE PT MISSES HIS APPT FOR THE WEEK NURSING AT THE FACILITY NEEDS TO CHANGE WRAPS AND DRESSINGS ON LEGS AND FOOT***** Secondary Dressing Wound #3 Right Calcaneus o ABD and  Kerlix/Conform Wound #7 Midline Coccyx o Boardered Foam Dressing Wound #1 Left Lower Leg o ABD pad - ******IF THE PT MISSES HIS APPT FOR THE WEEK NURSING AT THE FACILITY NEEDS TO CHANGE WRAPS AND DRESSINGS ON LEGS AND FOOT***** Wound #2 Right Lower Leg Tavenner, Lavelle V. ([id number]) o ABD pad - ******IF THE PT  MISSES HIS APPT FOR THE WEEK NURSING AT THE FACILITY NEEDS TO CHANGE WRAPS AND DRESSINGS ON LEGS AND FOOT***** Wound #6 Right,Lateral Foot o ABD pad - ******IF THE PT MISSES HIS APPT FOR THE WEEK NURSING AT THE FACILITY NEEDS TO CHANGE WRAPS AND DRESSINGS ON LEGS AND FOOT***** Dressing Change Frequency Wound #1 Left Lower Leg o Change dressing every week - in clinic ******IF THE PT MISSES HIS APPT FOR THE WEEK NURSING AT THE FACILITY NEEDS TO CHANGE WRAPS AND DRESSINGS ON LEGS AND FOOT***** Wound #2 Right Lower Leg o Change dressing every week - in clinic ******IF THE PT MISSES HIS APPT FOR THE WEEK NURSING AT THE FACILITY NEEDS TO CHANGE WRAPS AND DRESSINGS ON LEGS AND FOOT***** Wound #6 Right,Lateral Foot o Change dressing every week - in clinic ******IF THE PT MISSES HIS APPT FOR THE WEEK NURSING AT THE FACILITY NEEDS TO CHANGE WRAPS AND DRESSINGS ON LEGS AND FOOT***** Wound #3 Right Calcaneus o Change dressing every day. - to be changed daily by SNF RN Wound #7 Midline Coccyx o Change dressing every day. - to be changed daily by SNF RN Follow-up Appointments Wound #1 Left Lower Leg o Return Appointment in 1 week. - ******PLEASE MAKE SURE PATIENT COMES TO HIS WEEKLY APPTS, HE HAS TO HAVE HIS WRAPS CHANGED.****** ******IF THE PT MISSES HIS APPT FOR THE WEEK NURSING AT THE FACILITY NEEDS TO CHANGE WRAPS AND DRESSINGS ON LEGS AND FOOT***** Wound #2 Right Lower Leg o Return Appointment in 1 week. - ******PLEASE MAKE SURE PATIENT COMES TO HIS WEEKLY APPTS, HE HAS TO HAVE HIS WRAPS CHANGED.****** ******IF THE PT MISSES HIS APPT FOR THE WEEK NURSING AT  THE FACILITY NEEDS TO CHANGE WRAPS AND DRESSINGS ON LEGS AND FOOT***** Wound #6 Right,Lateral Foot o Return Appointment in 1 week. - ******PLEASE MAKE SURE PATIENT COMES TO HIS WEEKLY APPTS, HE HAS TO HAVE HIS WRAPS CHANGED.****** ******IF THE PT MISSES HIS APPT FOR THE WEEK NURSING AT THE FACILITY NEEDS TO CHANGE WRAPS AND DRESSINGS ON LEGS AND FOOT***** Wound #3 Right Calcaneus Fleener, Jadriel V. ([id number]) o Return Appointment in 1 week. - ******PLEASE MAKE SURE PATIENT COMES TO HIS WEEKLY APPTS, HE HAS TO HAVE HIS WRAPS CHANGED.****** ******IF THE PT MISSES HIS APPT FOR THE WEEK NURSING AT THE FACILITY NEEDS TO CHANGE WRAPS AND DRESSINGS ON LEGS AND FOOT***** Wound #7 Midline Coccyx o Return Appointment in 1 week. - ******PLEASE MAKE SURE PATIENT COMES TO HIS WEEKLY APPTS, HE HAS TO HAVE HIS WRAPS CHANGED.****** ******IF THE PT MISSES HIS APPT FOR THE WEEK NURSING AT THE FACILITY NEEDS TO CHANGE WRAPS AND DRESSINGS ON LEGS AND FOOT***** Edema Control Wound #1 Left Lower Leg o 2 Layer Lite Compression System - Bilateral - ******IF THE PT MISSES HIS APPT FOR THE WEEK NURSING AT THE FACILITY NEEDS TO CHANGE WRAPS AND DRESSINGS ON LEGS AND FOOT***** o Elevate legs to the level of the heart and pump ankles as often as possible - ******IF THE PT MISSES HIS APPT FOR THE WEEK NURSING AT THE FACILITY NEEDS TO CHANGE WRAPS AND DRESSINGS ON LEGS AND FOOT***** Wound #2 Right Lower Leg o 2 Layer Lite Compression System - Bilateral - ******IF THE PT MISSES HIS APPT FOR THE WEEK NURSING AT THE FACILITY NEEDS TO CHANGE WRAPS AND DRESSINGS ON LEGS AND FOOT***** o Elevate legs to the level of the heart and pump ankles as often as possible - ******IF THE PT MISSES HIS APPT FOR THE WEEK NURSING AT THE FACILITY  NEEDS TO CHANGE WRAPS AND DRESSINGS ON LEGS AND FOOT***** Wound #6 Right,Lateral Foot o 2 Layer Lite Compression System - Bilateral - ******IF THE PT MISSES HIS APPT FOR  THE WEEK NURSING AT THE FACILITY NEEDS TO CHANGE WRAPS AND DRESSINGS ON LEGS AND FOOT***** o Elevate legs to the level of the heart and pump ankles as often as possible - ******IF THE PT MISSES HIS APPT FOR THE WEEK NURSING AT THE FACILITY NEEDS TO CHANGE WRAPS AND DRESSINGS ON LEGS AND FOOT***** Off-Loading Wound #1 Left Lower Leg o Turn and reposition every 2 hours o Other: - sage boots Wound #2 Right Lower Leg o Turn and reposition every 2 hours o Other: - sage boots Wound #3 Right Calcaneus o Turn and reposition every 2 hours o Other: - sage boots Moes, Brown V. ([id number]) Wound #6 Right,Lateral Foot o Turn and reposition every 2 hours o Other: - sage boots Wound #7 Midline Coccyx o Turn and reposition every 2 hours o Other: - sage boots Additional Orders / Instructions Wound #1 Left Lower Leg o Increase protein intake. Wound #2 Right Lower Leg o Increase protein intake. Wound #3 Right Calcaneus o Increase protein intake. Wound #6 Right,Lateral Foot o Increase protein intake. Wound #7 Midline Coccyx o Increase protein intake. Medications-please add to medication list. Wound #1 Left Lower Leg o Other: - Vitamiin C, Vitamin A, Zinc, multivitamin Wound #2 Right Lower Leg o Other: - Vitamiin C, Vitamin A, Zinc, multivitamin Wound #3 Right Calcaneus o Other: - Vitamiin C, Vitamin A, Zinc, multivitamin Wound #6 Right,Lateral Foot o Other: - Vitamiin C, Vitamin A, Zinc, multivitamin Wound #7 Midline Coccyx o Other: - Vitamiin C, Vitamin A, Zinc, multivitamin Electronic Signature(s) Signed: 03/22/2016 3:59:17 PM By: Evlyn Kanner MD, FACS Signed: 03/22/2016 5:12:54 PM By: Alejandro Mulling Entered By: Alejandro Mulling on 03/22/2016 09:03:47 [name] ([id number]) -------------------------------------------------------------------------------- Problem List Details Patient Name: [name] Date of Service:  03/22/2016 8:00 AM Medical Record Number: [id number] Patient Account Number: [id number] Date of Birth/Sex: Apr 13, 1946 (70 y.o. Male) Treating RN: Phillis Haggis Primary Care Physician: Oretha Milch Other Clinician: Referring Physician: Oretha Milch Treating Physician/Extender: Rudene Re in Treatment: 11 Active Problems ICD-10 Encounter Code Description Active Date Diagnosis E11.621 Type 2 diabetes mellitus with foot ulcer 01/01/2016 Yes L89.613 Pressure ulcer of right heel, stage 3 01/01/2016 Yes E66.01 Morbid (severe) obesity due to excess calories 01/01/2016 Yes I89.0 Lymphedema, not elsewhere classified 01/01/2016 Yes M70.871 Other soft tissue disorders related to use, overuse and 01/19/2016 Yes pressure, right ankle and foot 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/16/2016 Yes ulceration of heel and midfoot Inactive Problems Resolved Problems ICD-10 Code Description Active Date Resolved Date L89.322 Pressure ulcer of left buttock, stage 2 01/01/2016 01/01/2016 Electronic Signature(s) Signed: 03/22/2016 9:03:52 AM By: Evlyn Kanner MD, FACS Entered By: Evlyn Kanner on 03/22/2016 09:03:52 [name] ([id number]) [name], Sammuel VMarland Kitchen ([id number]) -------------------------------------------------------------------------------- Progress Note Details Patient Name: [name] Date of Service: 03/22/2016 8:00 AM Medical Record Number: [id number] Patient Account Number: [id number] Date of Birth/Sex: 18-Sep-1946 (70 y.o. Male) Treating RN: Phillis Haggis Primary Care Physician: Oretha Milch Other Clinician: Referring Physician: Oretha Milch Treating Physician/Extender: Rudene Re in Treatment: 11 Subjective Chief Complaint Information obtained from Patient Patient is at the clinic for treatment of an open pressure ulcer the left upper thigh and gluteal region and the right heel with bilateral swelling of the legs all of it which is going on  for over  a year History of Present Illness (HPI) The following HPI elements were documented for the patient's wound: Location: ulcerated area on the right heel, left gluteal region and thigh and then bilateral lower extremities Quality: Patient reports experiencing a sharp pain to affected area(s). Severity: Patient states wound are getting worse. Duration: Patient has had the wound for > 12 months prior to seeking treatment at the wound center Timing: Pain in wound is constant (hurts all the time) Context: The wound appeared gradually over time Modifying Factors: Other treatment(s) tried include: he sees his heart doctor and his primary care doctor Associated Signs and Symptoms: patient has not been able to walk for over a year now 70 year old gentleman with a known history of hypertension, diabetes, obstructive sleep apnea, COPD, diastolic CHF, coronary artery disease was admitted to the hospital with sepsis from an ulcer of the right heel and was treated there in October 2016. he also has chronic bilateral lower extremity edema and lymphedema. He had received vancomycin, Zosyn and at that stage and x-rays showed hardware in the right ankle but no evidence of osteomyelitis. he was a former smoker. he was also treated with Augmentin orally for 10 days. He is either bedbound or wheelchair-bound and does not ambulate by himself. 01/19/2016 -- he has not been seen here for 3 weeks and this was because he was admitted to [facility] between 223 and 01/08/2016 for sepsis, UTI and pneumonia involving the left lung. he was treated with IV vancomycin and Zosyn and then meropenem. Was discharged home on oral Bactrim for 2 weeks none of his vascular test or x-rays were done and we will reorder these. 01/26/2016 -- he has not yet done the x-ray of his foot and is vascular tests are still pending. I have asked him to work on these with his nursing home staff. Addendum: he has got  an x-ray of the right foot done which shows that his osteopenia but no specific ostial lysis or abnormal periosteal reaction. Final impression was degenerative changes with dorsal foot soft tissue swelling. 02/16/2016 -- lower extremity arterial duplex examination shows a 50-99% stenosis of the right tibioperoneal trunk. He had biphasic flow in the right SFA, popliteal and tibioperoneal trunk. Left-sided he had triphasic flow throughout OSEIAS, HORSEY ([id number]) 02/29/2016 -- he is awaiting his vascular opinion with Dr. Gilda Crease which is to be done on April 24 03/08/2016 -- he has not kept his appointment with Dr. Gilda Crease on April 24 and does not seem to know what happened about this. it's difficult to gauge whether he is in full control of his mental faculties as at times he is extremely rude to the nursing staff. 03/22/2016 -- the patient was seen by Dr. 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 Recommended that the patient had severe atherosclerotic changes of both lower extremities associated with ulceration and tissue loss of the foot. This is a limb threatening ischemia and the patient was recommended to undergo angiography of the lower extremity with a hope for intervention for limb salvage. Patient agreed and will proceed to angiography. He was admitted to the hospital between May 2 and 03/15/2016 - he underwent induction of a catheter into his right lower extremity and third order catheter placement with contrast injection to the right lower extremity for distal runoff. Percutaneous transluminal angioplasty of the right superficial femoral and popliteal arteries were done.  He also had a right peroneal angioplasty. Patient had a postoperative hematoma and was admitted for observation and in the next 24 hours he was very agitated and combative and had to be seen by  psychiatric. He had a follow-up with Dr. Gilda Crease in 3 weeks Objective Constitutional Pulse regular. Respirations normal and unlabored. Afebrile. Vitals Time Taken: 8:11 AM, Height: 70 in, Weight: 235 lbs, BMI: 33.7, Pulse: 88 bpm, Respiratory Rate: 18 breaths/min, Blood Pressure: 108/61 mmHg. Eyes Nonicteric. Reactive to light. Ears, Nose, Mouth, and Throat Lips, teeth, and gums WNL.Marland Kitchen Moist mucosa without lesions. Neck supple and nontender. No palpable supraclavicular or cervical adenopathy. Normal sized without goiter. Respiratory WNL. No retractions.. Cardiovascular Pedal Pulses WNL. No clubbing, cyanosis or edema. Lymphatic No adneopathy. No adenopathy. No adenopathy. Sprong, Alixander VMarland Kitchen ([id number]) Musculoskeletal Adexa without tenderness or enlargement.. Digits and nails w/o clubbing, cyanosis, infection, petechiae, ischemia, or inflammatory conditions.Marland Kitchen Psychiatric Judgement and insight Intact.. No evidence of depression, anxiety, or agitation.. General Notes: the lymphedema has come down significantly and there are tiny open wounds both lower extremities. The right heel continues to have significant granulation tissue and is looking much better than before. The right lateral foot had a fairly big eschar which when removed has an open ulceration and this has been reestablished as a wound today. Integumentary (Hair, Skin) No suspicious lesions. No crepitus or fluctuance. No peri-wound warmth or erythema. No masses.. Wound #1 status is Open. Original cause of wound was Gradually Appeared. The wound is located on the Left Lower Leg. The wound measures 3cm length x 3cm width x 0.1cm depth; 7.069cm^2 area and 0.707cm^3 volume. The wound is limited to skin breakdown. There is no tunneling or undermining noted. There is a large amount of serous drainage noted. The wound margin is flat and intact. There is large (67- 100%) pink granulation within the wound bed. There is no necrotic  tissue within the wound bed. The periwound skin appearance exhibited: Localized Edema, Moist, Erythema. The surrounding wound skin color is noted with erythema which is circumferential. Periwound temperature was noted as No Abnormality. The periwound has tenderness on palpation. Wound #2 status is Open. Original cause of wound was Gradually Appeared. The wound is located on the Right Lower Leg. The wound measures 0.1cm length x 0.1cm width x 0.1cm depth; 0.008cm^2 area and 0.001cm^3 volume. The wound is limited to skin breakdown. There is no tunneling or undermining noted. There is a none present amount of drainage noted. The wound margin is flat and intact. There is large (67- 100%) red granulation within the wound bed. There is no necrotic tissue within the wound bed. The periwound skin appearance exhibited: Erythema. The periwound skin appearance did not exhibit: Localized Edema, Moist. The surrounding wound skin color is noted with erythema which is circumferential. Periwound temperature was noted as No Abnormality. The periwound has tenderness on palpation. Wound #3 status is Open. Original cause of wound was Pressure Injury. The wound is located on the Right Calcaneus. The wound measures 2.4cm length x 3.3cm width x 0.2cm depth; 6.22cm^2 area and 1.244cm^3 volume. The wound is limited to skin breakdown. There is no tunneling or undermining noted. There is a large amount of serous drainage noted. The wound margin is flat and intact. There is large (67- 100%) red, pink granulation within the wound bed. There is a small (1-33%) amount of necrotic tissue within the wound bed including Eschar and Adherent Slough. The periwound skin appearance exhibited: Localized Edema, Maceration, Moist, Erythema. The surrounding  wound skin color is noted with erythema which is circumferential. Periwound temperature was noted as No Abnormality. The periwound has tenderness on palpation. Wound #6 status is  Open. Original cause of wound was Pressure Injury. The wound is located on the Right,Lateral Foot. The wound measures 0.1cm length x 0.1cm width x 0.1cm depth; 0.008cm^2 area and 0.001cm^3 volume. The wound is limited to skin breakdown. There is no tunneling or undermining noted. There is a large amount of serosanguineous drainage noted. The wound margin is flat and intact. There is no granulation within the wound bed. There is a large (67-100%) amount of necrotic tissue within the wound bed including Eschar and Adherent Slough. The periwound skin appearance exhibited: Moist. Periwound temperature was noted as No Abnormality. The periwound has tenderness on palpation. Cashwell, Mukhtar V. ([id number]) Wound #7 status is Open. Original cause of wound was Pressure Injury. The wound is located on the Midline Coccyx. The wound measures 2cm length x 2cm width x 0.1cm depth; 3.142cm^2 area and 0.314cm^3 volume. The wound is limited to skin breakdown. There is no tunneling or undermining noted. There is a small amount of serous drainage noted. The wound margin is flat and intact. There is large (67- 100%) red granulation within the wound bed. There is no necrotic tissue within the wound bed. The periwound skin appearance exhibited: Moist. The periwound skin appearance did not exhibit: Callus, Crepitus, Excoriation, Fluctuance, Friable, Induration, Localized Edema, Rash, Scarring, Dry/Scaly, Maceration, Atrophie Blanche, Cyanosis, Ecchymosis, Hemosiderin Staining, Mottled, Pallor, Rubor, Erythema. Periwound temperature was noted as No Abnormality. The periwound has tenderness on palpation. 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 I have reviewed these reports in detail. I have recommended: 1. Santyl ointment locally to his right heel. The patient is very tender, but I have been able to establish a new wound on the right lateral foot after debriding a moist eschar. This will be treated with silver alginate 2. Constant off loading and also using Sage boot. 3. bilateral lower extremity 2 layer light compression, which he has been using before. 4. we have recommended juxta light stockings for him as he is going to be able to discontinue his compression wraps and use compression stockings 5. High-protein diet and multivitamins including vitamin C and zinc. Procedures Wound #6 Wound #6 is a Pressure Ulcer located on the Right,Lateral Foot . There was a Skin/Subcutaneous Tissue Depaoli, Tyri V. ([id number]) Debridement ([id number]) debridement with total area of 0.01 sq cm performed by Evlyn Kanner, MD. with the following instrument(s): Forceps and Scissors to remove Viable and Non-Viable tissue/material including Exudate, Fibrin/Slough, and Subcutaneous after achieving pain control using Lidocaine 4% Topical Solution. A time out was conducted prior to the start of the procedure. A Minimum amount of bleeding was controlled with Pressure. The procedure was tolerated well with a pain level of 0 throughout and a pain level of 0 following the procedure. Post Debridement Measurements: 0.5cm length x 0.5cm width x 0.2cm depth; 0.039cm^3 volume. Post debridement Stage noted as Category/Stage II.  Post procedure Diagnosis Wound #6: Same as Pre-Procedure Plan Wound Cleansing: Wound #1 Left Lower Leg: May shower with protection. - please cover wraps and keep dry No tub bath. Wound #2 Right Lower  Leg: May shower with protection. - please cover wraps and keep dry No tub bath. Wound #3 Right Calcaneus: May shower with protection. - please cover wraps and keep dry No tub bath. Wound #6 Right,Lateral Foot: May shower with protection. - please cover wraps and keep dry No tub bath. Wound #7 Midline Coccyx: May shower with protection. - please cover wraps and keep dry No tub bath. Anesthetic: Wound #1 Left Lower Leg: Topical Lidocaine 4% cream applied to wound bed prior to debridement Wound #6 Right,Lateral Foot: Topical Lidocaine 4% cream applied to wound bed prior to debridement Wound #2 Right Lower Leg: Topical Lidocaine 4% cream applied to wound bed prior to debridement Wound #3 Right Calcaneus: Topical Lidocaine 4% cream applied to wound bed prior to debridement Wound #7 Midline Coccyx: Topical Lidocaine 4% cream applied to wound bed prior to debridement Skin Barriers/Peri-Wound Care: Wound #1 Left Lower Leg: Barrier cream Wound #6 Right,Lateral Foot: Barrier cream Wound #2 Right Lower Leg: Barrier cream Watkin, Tam V. ([id number]) Wound #3 Right Calcaneus: Barrier cream Wound #7 Midline Coccyx: Barrier cream Primary Wound Dressing: Wound #1 Left Lower Leg: Aquacel Ag - ******IF THE PT MISSES HIS APPT FOR THE WEEK NURSING AT THE FACILITY NEEDS TO CHANGE WRAPS AND DRESSINGS ON LEGS AND FOOT***** Wound #2 Right Lower Leg: Aquacel Ag - ******IF THE PT MISSES HIS APPT FOR THE WEEK NURSING AT THE FACILITY NEEDS TO CHANGE WRAPS AND DRESSINGS ON LEGS AND FOOT***** Wound #6 Right,Lateral Foot: Aquacel Ag - ******IF THE PT MISSES HIS APPT FOR THE WEEK NURSING AT THE FACILITY NEEDS TO CHANGE WRAPS AND DRESSINGS ON LEGS AND FOOT***** Wound #3 Right Calcaneus: Santyl Ointment - ******IF THE PT MISSES HIS APPT FOR THE WEEK NURSING AT THE FACILITY NEEDS TO CHANGE WRAPS AND DRESSINGS ON LEGS AND FOOT***** Wound #7 Midline Coccyx: Aquacel Ag - ******IF THE PT MISSES HIS APPT  FOR THE WEEK NURSING AT THE FACILITY NEEDS TO CHANGE WRAPS AND DRESSINGS ON LEGS AND FOOT***** Secondary Dressing: Wound #3 Right Calcaneus: ABD and Kerlix/Conform Wound #7 Midline Coccyx: Boardered Foam Dressing Wound #1 Left Lower Leg: ABD pad - ******IF THE PT MISSES HIS APPT FOR THE WEEK NURSING AT THE FACILITY NEEDS TO CHANGE WRAPS AND DRESSINGS ON LEGS AND FOOT***** Wound #2 Right Lower Leg: ABD pad - ******IF THE PT MISSES HIS APPT FOR THE WEEK NURSING AT THE FACILITY NEEDS TO CHANGE WRAPS AND DRESSINGS ON LEGS AND FOOT***** Wound #6 Right,Lateral Foot: ABD pad - ******IF THE PT MISSES HIS APPT FOR THE WEEK NURSING AT THE FACILITY NEEDS TO CHANGE WRAPS AND DRESSINGS ON LEGS AND FOOT***** Dressing Change Frequency: Wound #1 Left Lower Leg: Change dressing every week - in clinic ******IF THE PT MISSES HIS APPT FOR THE WEEK NURSING AT THE FACILITY NEEDS TO CHANGE WRAPS AND DRESSINGS ON LEGS AND FOOT***** Wound #2 Right Lower Leg: Change dressing every week - in clinic ******IF THE PT MISSES HIS APPT FOR THE WEEK NURSING AT THE FACILITY NEEDS TO CHANGE WRAPS AND DRESSINGS ON LEGS AND FOOT***** Wound #6 Right,Lateral Foot: Change dressing every week - in clinic ******IF THE PT MISSES HIS APPT FOR THE WEEK NURSING AT THE FACILITY NEEDS TO CHANGE WRAPS AND DRESSINGS ON LEGS AND FOOT***** Wound #3 Right Calcaneus: Change dressing every day. -  to be changed daily by SNF RN Wound #7 Midline Coccyx: Change dressing every day. - to be changed daily by SNF RN Follow-up Appointments: Wound #1 Left Lower Leg: Return Appointment in 1 week. - ******PLEASE MAKE SURE PATIENT COMES TO HIS WEEKLY Schaum, Carl V. ([id number]) APPTS, HE HAS TO HAVE HIS WRAPS CHANGED.****** ******IF THE PT MISSES HIS APPT FOR THE WEEK NURSING AT THE FACILITY NEEDS TO CHANGE WRAPS AND DRESSINGS ON LEGS AND FOOT***** Wound #2 Right Lower Leg: Return Appointment in 1 week. - ******PLEASE MAKE SURE PATIENT COMES TO  HIS WEEKLY APPTS, HE HAS TO HAVE HIS WRAPS CHANGED.****** ******IF THE PT MISSES HIS APPT FOR THE WEEK NURSING AT THE FACILITY NEEDS TO CHANGE WRAPS AND DRESSINGS ON LEGS AND FOOT***** Wound #6 Right,Lateral Foot: Return Appointment in 1 week. - ******PLEASE MAKE SURE PATIENT COMES TO HIS WEEKLY APPTS, HE HAS TO HAVE HIS WRAPS CHANGED.****** ******IF THE PT MISSES HIS APPT FOR THE WEEK NURSING AT THE FACILITY NEEDS TO CHANGE WRAPS AND DRESSINGS ON LEGS AND FOOT***** Wound #3 Right Calcaneus: Return Appointment in 1 week. - ******PLEASE MAKE SURE PATIENT COMES TO HIS WEEKLY APPTS, HE HAS TO HAVE HIS WRAPS CHANGED.****** ******IF THE PT MISSES HIS APPT FOR THE WEEK NURSING AT THE FACILITY NEEDS TO CHANGE WRAPS AND DRESSINGS ON LEGS AND FOOT***** Wound #7 Midline Coccyx: Return Appointment in 1 week. - ******PLEASE MAKE SURE PATIENT COMES TO HIS WEEKLY APPTS, HE HAS TO HAVE HIS WRAPS CHANGED.****** ******IF THE PT MISSES HIS APPT FOR THE WEEK NURSING AT THE FACILITY NEEDS TO CHANGE WRAPS AND DRESSINGS ON LEGS AND FOOT***** Edema Control: Wound #1 Left Lower Leg: 2 Layer Lite Compression System - Bilateral - ******IF THE PT MISSES HIS APPT FOR THE WEEK NURSING AT THE FACILITY NEEDS TO CHANGE WRAPS AND DRESSINGS ON LEGS AND FOOT***** Elevate legs to the level of the heart and pump ankles as often as possible - ******IF THE PT MISSES HIS APPT FOR THE WEEK NURSING AT THE FACILITY NEEDS TO CHANGE WRAPS AND DRESSINGS ON LEGS AND FOOT***** Wound #2 Right Lower Leg: 2 Layer Lite Compression System - Bilateral - ******IF THE PT MISSES HIS APPT FOR THE WEEK NURSING AT THE FACILITY NEEDS TO CHANGE WRAPS AND DRESSINGS ON LEGS AND FOOT***** Elevate legs to the level of the heart and pump ankles as often as possible - ******IF THE PT MISSES HIS APPT FOR THE WEEK NURSING AT THE FACILITY NEEDS TO CHANGE WRAPS AND DRESSINGS ON LEGS AND FOOT***** Wound #6 Right,Lateral Foot: 2 Layer Lite Compression  System - Bilateral - ******IF THE PT MISSES HIS APPT FOR THE WEEK NURSING AT THE FACILITY NEEDS TO CHANGE WRAPS AND DRESSINGS ON LEGS AND FOOT***** Elevate legs to the level of the heart and pump ankles as often as possible - ******IF THE PT MISSES HIS APPT FOR THE WEEK NURSING AT THE FACILITY NEEDS TO CHANGE WRAPS AND DRESSINGS ON LEGS AND FOOT***** Off-Loading: Wound #1 Left Lower Leg: Turn and reposition every 2 hours Other: - sage boots Wound #2 Right Lower Leg: Turn and reposition every 2 hours Other: - sage boots Wound #3 Right Calcaneus: Turn and reposition every 2 hours Adel, Omario V. ([id number]) Other: - sage boots Wound #6 Right,Lateral Foot: Turn and reposition every 2 hours Other: - sage boots Wound #7 Midline Coccyx: Turn and reposition every 2 hours Other: - sage boots Additional Orders / Instructions: Wound #1 Left Lower Leg: Increase protein intake. Wound #2 Right Lower Leg: Increase protein  intake. Wound #3 Right Calcaneus: Increase protein intake. Wound #6 Right,Lateral Foot: Increase protein intake. Wound #7 Midline Coccyx: Increase protein intake. Medications-please add to medication list.: Wound #1 Left Lower Leg: Other: - Vitamiin C, Vitamin A, Zinc, multivitamin Wound #2 Right Lower Leg: Other: - Vitamiin C, Vitamin A, Zinc, multivitamin Wound #3 Right Calcaneus: Other: - Vitamiin C, Vitamin A, Zinc, multivitamin Wound #6 Right,Lateral Foot: Other: - Vitamiin C, Vitamin A, Zinc, multivitamin Wound #7 Midline Coccyx: Other: - Vitamiin C, Vitamin A, Zinc, multivitamin Since I saw him last a hospital admission has taken place and he has had vascular intervention to the right lower extremity. I have reviewed these reports in detail. I have recommended: 1. Santyl ointment locally to his right heel. The patient is very tender, but I have been able to establish a new wound on the right lateral foot after debriding a moist eschar. This will be treated  with silver alginate 2. Constant off loading and also using Sage boot. 3. bilateral lower extremity 2 layer light compression, which he has been using before. 4. we have recommended juxta light stockings for him as he is going to be able to discontinue his compression wraps and use compression stockings 5. High-protein diet and multivitamins including vitamin C and zinc. Oren, Barella Alexiz V. ([id number]) Electronic Signature(s) Signed: 03/22/2016 9:09:18 AM By: Evlyn Kanner MD, FACS Entered By: Evlyn Kanner on 03/22/2016 09:09:18 [name] ([id number]) -------------------------------------------------------------------------------- SuperBill Details Patient Name: [name] Date of Service: 03/22/2016 Medical Record Number: [id number] Patient Account Number: [id number] Date of Birth/Sex: [birth year] (70 y.o. 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 Facility Procedures CPT4: Description Modifier Quantity Code [id number] 11042 - DEB SUBQ TISSUE 20 SQ CM/< 1 ICD-10 Description Diagnosis E11.621 Type 2 diabetes mellitus with foot ulcer L89.613 Pressure ulcer of right heel, stage 3 M70.871 Other soft tissue disorders related  to use, overuse and pressure, right ankle and foot Physician Procedures CPT4: Description Modifier Quantity Code [id number] 99213 - WC PHYS LEVEL 3 - EST PT 25 1 ICD-10 Description Diagnosis E11.621  Type 2 diabetes mellitus with foot ulcer L89.613 Pressure ulcer of right heel, stage 3 I89.0 Lymphedema, not elsewhere classified  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

## 2016-03-29 ENCOUNTER — Encounter: Payer: Medicare Other | Admitting: Surgery

## 2016-03-29 DIAGNOSIS — E11621 Type 2 diabetes mellitus with foot ulcer: Secondary | ICD-10-CM | POA: Diagnosis not present

## 2016-03-29 NOTE — Progress Notes (Addendum)
REFORD, OLLIFF ([id number]) Visit Report for 03/29/2016 Chief Complaint Document Details Patient Name: [name] Date of Service: 03/29/2016 10:45 AM Medical Record Number: [id number] Patient Account Number: [id number] Date of Birth/Sex: 22-May-1946 (70 y.o. Male) Treating RN: Phillis Haggis Primary Care Physician: Oretha Milch Other Clinician: Referring Physician: Oretha Milch Treating Physician/Extender: Rudene Re in Treatment: 12 Information Obtained from: Patient Chief Complaint Patient is at the clinic for treatment of an open pressure ulcer the left upper thigh and gluteal region and the right heel with bilateral swelling of the legs all of it which is going on for over a year Electronic Signature(s) Signed: 03/29/2016 10:50:25 AM By: Evlyn Kanner MD, FACS Entered By: Evlyn Kanner on 03/29/2016 10:50:25 [name] ([id number]) -------------------------------------------------------------------------------- HPI Details Patient Name: [name] Date of Service: 03/29/2016 10:45 AM Medical Record Number: [id number] Patient Account Number: [id number] Date of Birth/Sex: [birth year] (70 y.o. Male) Treating RN: Phillis Haggis Primary Care Physician: Oretha Milch Other Clinician: Referring Physician: Oretha Milch Treating Physician/Extender: Rudene Re in Treatment: 12 History of Present Illness Location: ulcerated area on the right heel, left gluteal region and thigh and then bilateral lower extremities Quality: Patient reports experiencing a sharp pain to affected area(s). Severity: Patient states wound are getting worse. Duration: Patient has had the wound for > 12 months prior to seeking treatment at the wound center Timing: Pain in wound is constant (hurts all the time) Context: The wound appeared gradually over time Modifying Factors: Other treatment(s) tried include: he sees his heart doctor and his primary care doctor Associated Signs and  Symptoms: patient has not been able to walk for over a year now HPI Description: 70 year old gentleman with a known history of hypertension, diabetes, obstructive sleep apnea, COPD, diastolic CHF, coronary artery disease was admitted to the hospital with sepsis from an ulcer of the right heel and was treated there in October 2016. he also has chronic bilateral lower extremity edema and lymphedema. He had received vancomycin, Zosyn and at that stage and x-rays showed hardware in the right ankle but no evidence of osteomyelitis. he was a former smoker. he was also treated with Augmentin orally for 10 days. He is either bedbound or wheelchair-bound and does not ambulate by himself. 01/19/2016 -- he has not been seen here for 3 weeks and this was because he was admitted to [facility] between 223 and 01/08/2016 for sepsis, UTI and pneumonia involving the left lung. he was treated with IV vancomycin and Zosyn and then meropenem. Was discharged home on oral Bactrim for 2 weeks none of his vascular test or x-rays were done and we will reorder these. 01/26/2016 -- he has not yet done the x-ray of his foot and is vascular tests are still pending. I have asked him to work on these with his nursing home staff. Addendum: he has got an x-ray of the right foot done which shows that his osteopenia but no specific ostial lysis or abnormal periosteal reaction. Final impression was degenerative changes with dorsal foot soft tissue swelling. 02/16/2016 -- lower extremity arterial duplex examination shows a 50-99% stenosis of the right tibioperoneal trunk. He had biphasic flow in the right SFA, popliteal and tibioperoneal trunk. Left-sided he had triphasic flow throughout 02/29/2016 -- he is awaiting his vascular opinion with Dr. Gilda Crease which is to be done on April 24 03/08/2016 -- he has not kept his appointment with Dr. Gilda Crease on April 24 and does not seem to know what happened  about  this. it's difficult to gauge whether he is in full control of his mental faculties as at times he is extremely rude to the nursing staff. 03/22/2016 -- the patient was seen by Dr. 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 Recommended that the patient had severe atherosclerotic changes of both lower extremities associated with ulceration and tissue loss of the foot. This is a limb threatening ischemia and the patient was recommended to undergo Boehlke, Labron V. ([id number]) angiography of the lower extremity with a hope for intervention for limb salvage. Patient agreed and will proceed to angiography. He was admitted to the hospital between May 2 and 03/15/2016 - he underwent induction of a catheter into his right lower extremity and third order catheter placement with contrast injection to the right lower extremity for distal runoff. Percutaneous transluminal angioplasty of the right superficial femoral and popliteal arteries were done. He also had a right peroneal angioplasty. Patient had a postoperative hematoma and was admitted for observation and in the next 24 hours he was very agitated and combative and had to be seen by psychiatric. He had a follow-up with Dr. Gilda Crease in 3 weeks Electronic Signature(s) Signed: 03/29/2016 10:50:33 AM By: Evlyn Kanner MD, FACS Entered By: Evlyn Kanner on 03/29/2016 10:50:33 [name] ([id number]) -------------------------------------------------------------------------------- Physical Exam Details Patient Name: [name] Date of Service: 03/29/2016 10:45 AM Medical Record Number: [id number] Patient Account Number: [id number] Date of Birth/Sex: Nov 21, 1945 (70 y.o. Male) Treating RN: Phillis Haggis Primary Care Physician: Oretha Milch Other Clinician: Referring Physician: Oretha Milch Treating Physician/Extender: Rudene Re in Treatment: 12 Constitutional . Pulse regular. Respirations normal and unlabored. Afebrile. . Eyes Nonicteric. Reactive to light. Ears, Nose, Mouth, and Throat Lips, teeth, and gums WNL.Marland Kitchen Moist mucosa without lesions. Neck supple and nontender. No palpable supraclavicular or cervical adenopathy. Normal sized without goiter. Respiratory WNL. No retractions.. Cardiovascular Pedal Pulses WNL. No clubbing, cyanosis or edema. Lymphatic No adneopathy. No adenopathy. No adenopathy. Musculoskeletal Adexa without tenderness or enlargement.. Digits and nails w/o clubbing, cyanosis, infection, petechiae, ischemia, or inflammatory conditions.. Integumentary (Hair, Skin) No suspicious lesions. No crepitus or fluctuance. No peri-wound warmth or erythema. No masses.Marland Kitchen Psychiatric Judgement and insight Intact.. No evidence of depression, anxiety, or agitation.. Notes the lymphedema has come down significantly and there are tiny open wounds both lower extremities. The right heel continues to have significant granulation tissue and is looking much better than before. The right lateral foot had a fairly small eschar which is tender Electronic Signature(s) Signed: 03/29/2016 10:51:43 AM By: Evlyn Kanner MD, FACS Entered By: Evlyn Kanner on 03/29/2016 10:51:42 [name] ([id number]) -------------------------------------------------------------------------------- Physician Orders Details Patient Name: [name] Date of Service: 03/29/2016 10:45 AM Medical Record Number: [id number] Patient Account Number: [id number] Date of Birth/Sex: 03/26/1946 (70 y.o. Male) Treating RN: Phillis Haggis Primary Care Physician: Oretha Milch Other Clinician: Referring Physician: Oretha Milch Treating Physician/Extender: Rudene Re in Treatment: 12 Verbal / Phone Orders: Yes Clinician: Ashok Cordia, Debi Read Back and Verified: Yes Diagnosis Coding Wound Cleansing Wound #1 Left  Lower Leg o May shower with protection. - please cover wraps and keep dry o No tub bath. Wound #2 Right Lower Leg o May shower with protection. - please cover wraps and keep dry o No tub bath. Wound #3 Right Calcaneus o May shower with protection. - please cover wraps and keep dry o No  tub bath. Wound #6 Right,Lateral Foot o May shower with protection. - please cover wraps and keep dry o No tub bath. Wound #7 Midline Coccyx o May shower with protection. - please cover wraps and keep dry o No tub bath. Anesthetic Wound #1 Left Lower Leg o Topical Lidocaine 4% cream applied to wound bed prior to debridement Wound #2 Right Lower Leg o Topical Lidocaine 4% cream applied to wound bed prior to debridement Wound #3 Right Calcaneus o Topical Lidocaine 4% cream applied to wound bed prior to debridement Wound #6 Right,Lateral Foot o Topical Lidocaine 4% cream applied to wound bed prior to debridement Wound #7 Midline Coccyx o Topical Lidocaine 4% cream applied to wound bed prior to debridement Keagle, Zuhayr V. ([id number]) Skin Barriers/Peri-Wound Care Wound #1 Left Lower Leg o Barrier cream Wound #2 Right Lower Leg o Barrier cream Wound #3 Right Calcaneus o Barrier cream Wound #6 Right,Lateral Foot o Barrier cream Wound #7 Midline Coccyx o Barrier cream Primary Wound Dressing Wound #1 Left Lower Leg o Aquacel Ag Wound #2 Right Lower Leg o Aquacel Ag Wound #3 Right Calcaneus o Santyl Ointment Wound #6 Right,Lateral Foot o Aquacel Ag Wound #7 Midline Coccyx o Aquacel Ag Secondary Dressing Wound #1 Left Lower Leg o ABD pad Wound #2 Right Lower Leg o ABD pad Wound #3 Right Calcaneus o Conform/Kerlix o Foam Wound #6 Right,Lateral Foot o Foam Wound #7 Midline Coccyx o Boardered Foam Dressing Resnik, Kaya V. ([id number]) Dressing Change Frequency Wound #1 Left Lower Leg o Change dressing every week - in  clinic ******IF THE PT MISSES HIS APPT FOR THE WEEK NURSING AT THE FACILITY NEEDS TO CHANGE WRAPS AND DRESSINGS ON LEGS AND FOOT***** Wound #2 Right Lower Leg o Change dressing every week - in clinic ******IF THE PT MISSES HIS APPT FOR THE WEEK NURSING AT THE FACILITY NEEDS TO CHANGE WRAPS AND DRESSINGS ON LEGS AND FOOT***** Wound #3 Right Calcaneus o Change dressing every day. - to be changed daily by SNF RN Wound #6 Right,Lateral Foot o Change dressing every week - in clinic ******IF THE PT MISSES HIS APPT FOR THE WEEK NURSING AT THE FACILITY NEEDS TO CHANGE WRAPS AND DRESSINGS ON LEGS AND FOOT***** Wound #7 Midline Coccyx o Change dressing every day. - to be changed daily by SNF RN Follow-up Appointments Wound #1 Left Lower Leg o Return Appointment in 1 week. Wound #2 Right Lower Leg o Return Appointment in 1 week. Wound #3 Right Calcaneus o Return Appointment in 1 week. Wound #6 Right,Lateral Foot o Return Appointment in 1 week. Wound #7 Midline Coccyx o Return Appointment in 1 week. Edema Control Wound #1 Left Lower Leg o 2 Layer Lite Compression System - Bilateral o Elevate legs to the level of the heart and pump ankles as often as possible Wound #2 Right Lower Leg o 2 Layer Lite Compression System - Bilateral o Elevate legs to the level of the heart and pump ankles as often as possible - ******IF THE PT MISSES HIS APPT FOR THE WEEK NURSING AT THE FACILITY NEEDS TO CHANGE WRAPS AND DRESSINGS ON LEGS AND FOOT***** Majka, Masayuki V. ([id number]) Wound #6 Right,Lateral Foot o 2 Layer Lite Compression System - Bilateral o Elevate legs to the level of the heart and pump ankles as often as possible - ******IF THE PT MISSES HIS APPT FOR THE WEEK NURSING AT THE FACILITY NEEDS TO CHANGE WRAPS AND DRESSINGS ON LEGS AND FOOT***** Off-Loading Wound #1 Left Lower Leg o Turn  and reposition every 2 hours o Other: - sage boots Wound #2 Right Lower  Leg o Turn and reposition every 2 hours o Other: - sage boots Wound #3 Right Calcaneus o Turn and reposition every 2 hours o Other: - sage boots Wound #6 Right,Lateral Foot o Turn and reposition every 2 hours o Other: - sage boots Wound #7 Midline Coccyx o Turn and reposition every 2 hours o Other: - sage boots Additional Orders / Instructions Wound #1 Left Lower Leg o Increase protein intake. Wound #2 Right Lower Leg o Increase protein intake. Wound #3 Right Calcaneus o Increase protein intake. Wound #6 Right,Lateral Foot o Increase protein intake. Wound #7 Midline Coccyx o Increase protein intake. Medications-please add to medication list. Wound #1 Left Lower Leg o Other: - Vitamin C, Vitamin A, Zinc, multivitamin Bartolo, Markise V. ([id number]) Wound #2 Right Lower Leg o Other: - Vitamin C, Vitamin A, Zinc, multivitamin Wound #3 Right Calcaneus o Other: - Vitamin C, Vitamin A, Zinc, multivitamin Wound #6 Right,Lateral Foot o Other: - Vitamin C, Vitamin A, Zinc, multivitamin Wound #7 Midline Coccyx o Other: - Vitamin C, Vitamin A, Zinc, multivitamin Notes SNF to provide for next week compression hose 20-5430mmHg compression for patient and also bilateral juzo compression wraps. Please send both to patient's next appointment Electronic Signature(s) Signed: 03/29/2016 3:46:07 PM By: Evlyn KannerBritto, Naya Ilagan MD, FACS Signed: 03/29/2016 4:07:37 PM By: Alejandro MullingPinkerton, Debra Entered By: Alejandro MullingPinkerton, Debra on 03/29/2016 11:19:07 [name] OttoLINDLEY, Johnothan V. ([id number]) -------------------------------------------------------------------------------- Problem List Details Patient Name: [name] OttoLINDLEY, [name] V. Date of Service: 03/29/2016 10:45 AM Medical Record Number: [id number] Patient Account Number: [id number] Date of Birth/Sex: Nov 12, 1945 [age] y.o. Male) Treating RN: Phillis HaggisPinkerton, Debi Primary Care Physician: Oretha MilchSMITH, [name] Other Clinician: Referring Physician: Oretha MilchSMITH, [name] Treating  Physician/Extender: Rudene ReBritto, Amedio Bowlby Weeks in Treatment: 12 Active Problems ICD-10 Encounter Code Description Active Date Diagnosis E11.621 Type 2 diabetes mellitus with foot ulcer 01/01/2016 Yes L89.613 Pressure ulcer of right heel, stage 3 01/01/2016 Yes E66.01 Morbid (severe) obesity due to excess calories 01/01/2016 Yes I89.0 Lymphedema, not elsewhere classified 01/01/2016 Yes M70.871 Other soft tissue disorders related to use, overuse and 01/19/2016 Yes pressure, right ankle and foot 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/16/2016 Yes ulceration of heel and midfoot Inactive Problems Resolved Problems ICD-10 Code Description Active Date Resolved Date L89.322 Pressure ulcer of left buttock, stage 2 01/01/2016 01/01/2016 Electronic Signature(s) Signed: 03/29/2016 10:50:15 AM By: Evlyn KannerBritto, Simmone Cape MD, FACS Entered By: Evlyn KannerBritto, Ledora Delker on 03/29/2016 10:50:15 [name] OttoLINDLEY, Salvatore V. ([id number]) Clint GuyLINDLEY, Madsen VMarland Kitchen. ([id number]) -------------------------------------------------------------------------------- Progress Note Details Patient Name: [name] OttoLINDLEY, [name] V. Date of Service: 03/29/2016 10:45 AM Medical Record Number: [id number] Patient Account Number: [id number] Date of Birth/Sex: Nov 12, 1945 [age] y.o. Male) Treating RN: Phillis HaggisPinkerton, Debi Primary Care Physician: Oretha MilchSMITH, [name] Other Clinician: Referring Physician: Oretha MilchSMITH, [name] Treating Physician/Extender: Rudene ReBritto, Abiel Antrim Weeks in Treatment: 12 Subjective Chief Complaint Information obtained from Patient Patient is at the clinic for treatment of an open pressure ulcer the left upper thigh and gluteal region and the right heel with bilateral swelling of the legs all of it which is going on for over a year History of Present Illness (HPI) The following HPI elements were documented for the patient's wound: Location: ulcerated area on the right heel, left gluteal region and thigh and then bilateral lower extremities Quality: Patient reports  experiencing a sharp pain to affected area(s). Severity: Patient states wound are getting worse. Duration: Patient has had the wound for > 12 months prior to seeking treatment at the wound  center Timing: Pain in wound is constant (hurts all the time) Context: The wound appeared gradually over time Modifying Factors: Other treatment(s) tried include: he sees his heart doctor and his primary care doctor Associated Signs and Symptoms: patient has not been able to walk for over a year now 70 year old gentleman with a known history of hypertension, diabetes, obstructive sleep apnea, COPD, diastolic CHF, coronary artery disease was admitted to the hospital with sepsis from an ulcer of the right heel and was treated there in October 2016. he also has chronic bilateral lower extremity edema and lymphedema. He had received vancomycin, Zosyn and at that stage and x-rays showed hardware in the right ankle but no evidence of osteomyelitis. he was a former smoker. he was also treated with Augmentin orally for 10 days. He is either bedbound or wheelchair-bound and does not ambulate by himself. 01/19/2016 -- he has not been seen here for 3 weeks and this was because he was admitted to [facility] between 223 and 01/08/2016 for sepsis, UTI and pneumonia involving the left lung. he was treated with IV vancomycin and Zosyn and then meropenem. Was discharged home on oral Bactrim for 2 weeks none of his vascular test or x-rays were done and we will reorder these. 01/26/2016 -- he has not yet done the x-ray of his foot and is vascular tests are still pending. I have asked him to work on these with his nursing home staff. Addendum: he has got an x-ray of the right foot done which shows that his osteopenia but no specific ostial lysis or abnormal periosteal reaction. Final impression was degenerative changes with dorsal foot soft tissue swelling. 02/16/2016 -- lower extremity arterial duplex  examination shows a 50-99% stenosis of the right tibioperoneal trunk. He had biphasic flow in the right SFA, popliteal and tibioperoneal trunk. Left-sided he had triphasic flow throughout SEGER, JANI ([id number]) 02/29/2016 -- he is awaiting his vascular opinion with Dr. Gilda Crease which is to be done on April 24 03/08/2016 -- he has not kept his appointment with Dr. Gilda Crease on April 24 and does not seem to know what happened about this. it's difficult to gauge whether he is in full control of his mental faculties as at times he is extremely rude to the nursing staff. 03/22/2016 -- the patient was seen by Dr. 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 Recommended that the patient had severe atherosclerotic changes of both lower extremities associated with ulceration and tissue loss of the foot. This is a limb threatening ischemia and the patient was recommended to undergo angiography of the lower extremity with a hope for intervention for limb salvage. Patient agreed and will proceed to angiography. He was admitted to the hospital between May 2 and 03/15/2016 - he underwent induction of a catheter into his right lower extremity and third order catheter placement with contrast injection to the right lower extremity for distal runoff. Percutaneous transluminal angioplasty of the right superficial femoral and popliteal arteries were done. He also had a right peroneal angioplasty. Patient had a postoperative hematoma and was admitted for observation and in the next 24 hours he was very agitated and combative and had to be seen by psychiatric. He had a follow-up with Dr. Gilda Crease in 3 weeks Objective Constitutional Pulse regular. Respirations normal and unlabored. Afebrile. Vitals Time Taken: 10:23 AM, Height: 70 in, Weight: 235 lbs, BMI: 33.7,  Pulse: 93 bpm, Respiratory Rate: 18  breaths/min, Blood Pressure: 108/56 mmHg. Eyes Nonicteric. Reactive to light. Ears, Nose, Mouth, and Throat Lips, teeth, and gums WNL.Marland Kitchen Moist mucosa without lesions. Neck supple and nontender. No palpable supraclavicular or cervical adenopathy. Normal sized without goiter. Respiratory WNL. No retractions.. Cardiovascular Pedal Pulses WNL. No clubbing, cyanosis or edema. Lymphatic No adneopathy. No adenopathy. No adenopathy. Sorn, Arval VMarland Kitchen ([id number]) Musculoskeletal Adexa without tenderness or enlargement.. Digits and nails w/o clubbing, cyanosis, infection, petechiae, ischemia, or inflammatory conditions.Marland Kitchen Psychiatric Judgement and insight Intact.. No evidence of depression, anxiety, or agitation.. General Notes: the lymphedema has come down significantly and there are tiny open wounds both lower extremities. The right heel continues to have significant granulation tissue and is looking much better than before. The right lateral foot had a fairly small eschar which is tender Integumentary (Hair, Skin) No suspicious lesions. No crepitus or fluctuance. No peri-wound warmth or erythema. No masses.. Wound #1 status is Open. Original cause of wound was Gradually Appeared. The wound is located on the Left Lower Leg. The wound measures 5cm length x 3cm width x 0.1cm depth; 11.781cm^2 area and 1.178cm^3 volume. The wound is limited to skin breakdown. There is no tunneling or undermining noted. There is a large amount of serous drainage noted. The wound margin is flat and intact. There is large (67- 100%) pink granulation within the wound bed. There is no necrotic tissue within the wound bed. The periwound skin appearance exhibited: Localized Edema, Moist, Erythema. The surrounding wound skin color is noted with erythema which is circumferential. Periwound temperature was noted as No Abnormality. The periwound has tenderness on palpation. Wound #2 status is Open. Original cause of wound  was Gradually Appeared. The wound is located on the Right Lower Leg. The wound measures 0.1cm length x 0.1cm width x 0.1cm depth; 0.008cm^2 area and 0.001cm^3 volume. The wound is limited to skin breakdown. There is no tunneling or undermining noted. There is a none present amount of drainage noted. The wound margin is flat and intact. There is large (67- 100%) red granulation within the wound bed. There is no necrotic tissue within the wound bed. The periwound skin appearance exhibited: Erythema. The periwound skin appearance did not exhibit: Localized Edema, Moist. The surrounding wound skin color is noted with erythema which is circumferential. Periwound temperature was noted as No Abnormality. The periwound has tenderness on palpation. Wound #3 status is Open. Original cause of wound was Pressure Injury. The wound is located on the Right Calcaneus. The wound measures 2.5cm length x 3.5cm width x 0.1cm depth; 6.872cm^2 area and 0.687cm^3 volume. The wound is limited to skin breakdown. There is no tunneling or undermining noted. There is a large amount of serous drainage noted. The wound margin is flat and intact. There is large (67- 100%) red, pink granulation within the wound bed. There is a small (1-33%) amount of necrotic tissue within the wound bed including Eschar and Adherent Slough. The periwound skin appearance exhibited: Localized Edema, Maceration, Moist, Erythema. The surrounding wound skin color is noted with erythema which is circumferential. Periwound temperature was noted as No Abnormality. The periwound has tenderness on palpation. Wound #6 status is Open. Original cause of wound was Pressure Injury. The wound is located on the Right,Lateral Foot. The wound measures 0.5cm length x 0.5cm width x 0.1cm depth; 0.196cm^2 area and 0.02cm^3 volume. The wound is limited to skin breakdown. There is no tunneling or undermining noted. There is a large amount of serosanguineous drainage  noted. The wound margin is flat and intact. There is no granulation within the wound bed. There is a large (67-100%) amount of necrotic tissue within the wound bed including Eschar and Adherent Slough. The periwound skin appearance exhibited: Moist. Periwound temperature was noted as No Abnormality. The periwound has tenderness on palpation. Crouse, Husam V. ([id number]) Wound #7 status is Open. Original cause of wound was Pressure Injury. The wound is located on the Midline Coccyx. The wound measures 4cm length x 3cm width x 0.1cm depth; 9.425cm^2 area and 0.942cm^3 volume. The wound is limited to skin breakdown. There is a small amount of serous drainage noted. The wound margin is flat and intact. There is large (67-100%) red granulation within the wound bed. There is no necrotic tissue within the wound bed. The periwound skin appearance exhibited: Moist. The periwound skin appearance did not exhibit: Callus, Crepitus, Excoriation, Fluctuance, Friable, Induration, Localized Edema, Rash, Scarring, Dry/Scaly, Maceration, Atrophie Blanche, Cyanosis, Ecchymosis, Hemosiderin Staining, Mottled, Pallor, Rubor, Erythema. Periwound temperature was noted as No Abnormality. The periwound has tenderness on palpation. 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 - please cover wraps and keep dry No tub bath. Wound #2 Right Lower Leg: May shower with protection. - please cover wraps and keep dry No tub bath. Wound #3 Right Calcaneus: May shower with  protection. - please cover wraps and keep dry No tub bath. Wound #6 Right,Lateral Foot: May shower with protection. - please cover wraps and keep dry No tub bath. Wound #7 Midline Coccyx: May shower with protection. - please cover wraps and keep dry No tub bath. Anesthetic: Wound #1 Left Lower Leg: Truman, Ozan V. ([id number]) Topical Lidocaine 4% cream applied to wound bed prior to debridement Wound #2 Right Lower Leg: Topical Lidocaine 4% cream applied to wound bed prior to debridement Wound #3 Right Calcaneus: Topical Lidocaine 4% cream applied to wound bed prior to debridement Wound #6 Right,Lateral Foot: Topical Lidocaine 4% cream applied to wound bed prior to debridement Wound #7 Midline Coccyx: Topical Lidocaine 4% cream applied to wound bed prior to debridement Skin Barriers/Peri-Wound Care: Wound #1 Left Lower Leg: Barrier cream Wound #2 Right Lower Leg: Barrier cream Wound #3 Right Calcaneus: Barrier cream Wound #6 Right,Lateral Foot: Barrier cream Wound #7 Midline Coccyx: Barrier cream Primary Wound Dressing: Wound #1 Left Lower Leg: Aquacel Ag Wound #2 Right Lower Leg: Aquacel Ag Wound #3 Right Calcaneus: Santyl Ointment Wound #6 Right,Lateral Foot: Aquacel Ag Wound #7 Midline Coccyx: Aquacel Ag Secondary Dressing: Wound #1 Left Lower Leg: ABD pad Wound #2 Right Lower Leg: ABD pad Wound #3 Right Calcaneus: Conform/Kerlix Foam Wound #6 Right,Lateral Foot: Foam Wound #7 Midline Coccyx: Boardered Foam Dressing Dressing Change Frequency: Wound #1 Left Lower Leg: Change dressing every week - in clinic ******IF THE PT MISSES HIS APPT FOR THE WEEK NURSING AT THE FACILITY NEEDS TO CHANGE WRAPS AND DRESSINGS ON LEGS AND FOOT***** Wound #2 Right Lower Leg: Change dressing every week - in clinic ******IF THE PT MISSES HIS APPT FOR THE WEEK NURSING AT THE FACILITY NEEDS TO CHANGE WRAPS  AND DRESSINGS ON LEGS AND FOOT***** Wound #3 Right  Calcaneus: Foulkes, Tristain V. ([id number]) Change dressing every day. - to be changed daily by SNF RN Wound #6 Right,Lateral Foot: Change dressing every week - in clinic ******IF THE PT MISSES HIS APPT FOR THE WEEK NURSING AT THE FACILITY NEEDS TO CHANGE WRAPS AND DRESSINGS ON LEGS AND FOOT***** Wound #7 Midline Coccyx: Change dressing every day. - to be changed daily by SNF RN Follow-up Appointments: Wound #1 Left Lower Leg: Return Appointment in 1 week. Wound #2 Right Lower Leg: Return Appointment in 1 week. Wound #3 Right Calcaneus: Return Appointment in 1 week. Wound #6 Right,Lateral Foot: Return Appointment in 1 week. Wound #7 Midline Coccyx: Return Appointment in 1 week. Edema Control: Wound #1 Left Lower Leg: 2 Layer Lite Compression System - Bilateral Elevate legs to the level of the heart and pump ankles as often as possible Wound #2 Right Lower Leg: 2 Layer Lite Compression System - Bilateral Elevate legs to the level of the heart and pump ankles as often as possible - ******IF THE PT MISSES HIS APPT FOR THE WEEK NURSING AT THE FACILITY NEEDS TO CHANGE WRAPS AND DRESSINGS ON LEGS AND FOOT***** Wound #6 Right,Lateral Foot: 2 Layer Lite Compression System - Bilateral Elevate legs to the level of the heart and pump ankles as often as possible - ******IF THE PT MISSES HIS APPT FOR THE WEEK NURSING AT THE FACILITY NEEDS TO CHANGE WRAPS AND DRESSINGS ON LEGS AND FOOT***** Off-Loading: Wound #1 Left Lower Leg: Turn and reposition every 2 hours Other: - sage boots Wound #2 Right Lower Leg: Turn and reposition every 2 hours Other: - sage boots Wound #3 Right Calcaneus: Turn and reposition every 2 hours Other: - sage boots Wound #6 Right,Lateral Foot: Turn and reposition every 2 hours Other: - sage boots Wound #7 Midline Coccyx: Turn and reposition every 2 hours Other: - sage boots Additional Orders / Instructions: Wound #1 Left Lower Leg: Increase protein  intake. Wound #2 Right Lower Leg: Grace, Dontay V. ([id number]) Increase protein intake. Wound #3 Right Calcaneus: Increase protein intake. Wound #6 Right,Lateral Foot: Increase protein intake. Wound #7 Midline Coccyx: Increase protein intake. Medications-please add to medication list.: Wound #1 Left Lower Leg: Other: - Vitamin C, Vitamin A, Zinc, multivitamin Wound #2 Right Lower Leg: Other: - Vitamin C, Vitamin A, Zinc, multivitamin Wound #3 Right Calcaneus: Other: - Vitamin C, Vitamin A, Zinc, multivitamin Wound #6 Right,Lateral Foot: Other: - Vitamin C, Vitamin A, Zinc, multivitamin Wound #7 Midline Coccyx: Other: - Vitamin C, Vitamin A, Zinc, multivitamin General Notes: SNF to provide for next week compression hose 20-80mmHg compression for patient and also bilateral juzo compression wraps. Please send both to patient's next appointment I have recommended: 1. Santyl ointment locally to his right heel. The patient is very tender, but I have been able to establish a new wound on the right lateral foot. This will be treated with silver alginate 2. Constant off loading and also using Sage boot. 3. bilateral lower extremity 2 layer light compression, which he has been using before. 4. we have recommended juxta light stockings for him as he is going to be able to discontinue his compression wraps and use compression stockings 5. High-protein diet and multivitamins including vitamin C and zinc. Electronic Signature(s) Signed: 03/29/2016 3:44:17 PM By: Evlyn Kanner MD, FACS Previous Signature: 03/29/2016 10:52:42 AM Version By: Evlyn Kanner MD, FACS Entered By: Evlyn Kanner on 03/29/2016 15:44:17 Streat, Edrik V. ([id number]) -------------------------------------------------------------------------------- SuperBill  Details Patient Name: [name], STENCIL. Date of Service: 03/29/2016 Medical Record Number: [id number] Patient Account Number: [id number] Date of Birth/Sex: 1946-06-13  (70 y.o. 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 Facility Procedures CPT4 Code: [id number] Description: 30865 - WOUND CARE VISIT-LEV 5 EST PT Modifier: Quantity: 1 Physician Procedures CPT4 Code: [id number] Description: 99213 - WC PHYS LEVEL 3 - EST PT ICD-10 Description Diagnosis E11.621 Type 2 diabetes mellitus with foot ulcer L89.613 Pressure ulcer of right heel, stage 3 E66.01 Morbid (severe) obesity due to excess calories I89.0 Lymphedema, not  elsewhere classified Modifier: Quantity: 1 Electronic Signature(s) Signed: 03/29/2016 3:46:07 PM By: Evlyn Kanner MD, FACS Signed: 03/29/2016 4:07:37 PM By: Alejandro Mulling Previous Signature: 03/29/2016 10:53:01 AM Version By: Evlyn Kanner MD, FACS Entered By: Alejandro Mulling on 03/29/2016 12:49:57

## 2016-03-30 NOTE — Progress Notes (Signed)
[name], [name] ([id number]) Visit Report for 03/29/2016 Arrival Information Details Patient Name: [name], [name] Date of Service: 03/29/2016 10:45 AM Medical Record Number: [id number] Patient Account Number: [id number] Date of Birth/Sex: [birth year] (70 y.o. Male) Treating RN: Phillis Haggis Primary Care Physician: Oretha Milch Other Clinician: Referring Physician: Oretha Milch Treating Physician/Extender: Rudene Re in Treatment: 12 Visit Information History Since Last Visit All ordered tests and consults were completed: No Patient Arrived: Wheel Chair Added or deleted any medications: No Arrival Time: 10:16 Any new allergies or adverse reactions: No Accompanied By: self Had a fall or experienced change in No activities of daily living that may affect Transfer Assistance: None risk of falls: Patient Identification Verified: Yes Signs or symptoms of abuse/neglect since last No Secondary Verification Process Yes visito Completed: Pain Present Now: No Patient Requires Transmission-Based No Precautions: Patient Has Alerts: Yes Patient Alerts: DM II Electronic Signature(s) Signed: 03/29/2016 4:07:37 PM By: Alejandro Mulling Entered By: Alejandro Mulling on 03/29/2016 10:16:29 [name] ([id number]) -------------------------------------------------------------------------------- Clinic Level of Care Assessment Details Patient Name: [name] Date of Service: 03/29/2016 10:45 AM Medical Record Number: [id number] Patient Account Number: [id number] Date of Birth/Sex: 1946/03/28 (70 y.o. Male) Treating RN: Phillis Haggis Primary Care Physician: Oretha Milch Other Clinician: Referring Physician: Oretha Milch Treating Physician/Extender: Rudene Re in Treatment: 12 Clinic Level of Care Assessment Items TOOL 4 Quantity Score X - Use when only an EandM is performed on FOLLOW-UP visit 1 0 ASSESSMENTS - Nursing Assessment / Reassessment X - Reassessment  of Co-morbidities (includes updates in patient status) 1 10 X - Reassessment of Adherence to Treatment Plan 1 5 ASSESSMENTS - Wound and Skin Assessment / Reassessment []  - Simple Wound Assessment / Reassessment - one wound 0 X - Complex Wound Assessment / Reassessment - multiple wounds 5 5 []  - Dermatologic / Skin Assessment (not related to wound area) 0 ASSESSMENTS - Focused Assessment X - Circumferential Edema Measurements - multi extremities 1 5 []  - Nutritional Assessment / Counseling / Intervention 0 []  - Lower Extremity Assessment (monofilament, tuning fork, pulses) 0 []  - Peripheral Arterial Disease Assessment (using hand held doppler) 0 ASSESSMENTS - Ostomy and/or Continence Assessment and Care []  - Incontinence Assessment and Management 0 []  - Ostomy Care Assessment and Management (repouching, etc.) 0 PROCESS - Coordination of Care []  - Simple Patient / Family Education for ongoing care 0 X - Complex (extensive) Patient / Family Education for ongoing care 1 20 X - Staff obtains Chiropractor, Records, Test Results / Process Orders 1 10 X - Staff telephones HHA, Nursing Homes / Clarify orders / etc 1 10 []  - Routine Transfer to another Facility (non-emergent condition) 0 [name], [name] ([id number]) []  - Routine Hospital Admission (non-emergent condition) 0 []  - New Admissions / Manufacturing engineer / Ordering NPWT, Apligraf, etc. 0 []  - Emergency Hospital Admission (emergent condition) 0 X - Simple Discharge Coordination 1 10 []  - Complex (extensive) Discharge Coordination 0 PROCESS - Special Needs []  - Pediatric / Minor Patient Management 0 []  - Isolation Patient Management 0 []  - Hearing / Language / Visual special needs 0 []  - Assessment of Community assistance (transportation, D/C planning, etc.) 0 []  - Additional assistance / Altered mentation 0 []  - Support Surface(s) Assessment (bed, cushion, seat, etc.) 0 INTERVENTIONS - Wound Cleansing / Measurement []  - Simple  Wound Cleansing - one wound 0 X - Complex Wound Cleansing - multiple wounds 5 5 X - Wound Imaging (photographs - any number of wounds)  1 5  - Wound Tracing (instead of photographs) 0  - Simple Wound Measurement - one wound 0 X - Complex Wound Measurement - multiple wounds 5 5 INTERVENTIONS - Wound Dressings  - Small Wound Dressing one or multiple wounds 0  - Medium Wound Dressing one or multiple wounds 0 X - Large Wound Dressing one or multiple wounds 3 20 X - Application of Medications - topical 1 5  - Application of Medications - injection 0 INTERVENTIONS - Miscellaneous  - External ear exam 0 [name], [name] ([id number])  - Specimen Collection (cultures, biopsies, blood, body fluids, etc.) 0  - Specimen(s) / Culture(s) sent or taken to Lab for analysis 0  - Patient Transfer (multiple staff / Michiel Sites Lift / Similar devices) 0  - Simple Staple / Suture removal (25 or less) 0  - Complex Staple / Suture removal (26 or more) 0  - Hypo / Hyperglycemic Management (close monitor of Blood Glucose) 0  - Ankle / Brachial Index (ABI) - do not check if billed separately 0 X - Vital Signs 1 5 Has the patient been seen at the hospital within the last three years: Yes Total Score: 220 Level Of Care: New/Established - Level 5 Electronic Signature(s) Signed: 03/29/2016 4:07:37 PM By: Alejandro Mulling Entered By: Alejandro Mulling on 03/29/2016 12:50:06 [name] ([id number]) -------------------------------------------------------------------------------- Encounter Discharge Information Details Patient Name: [name] Date of Service: 03/29/2016 10:45 AM Medical Record Number: [id number] Patient Account Number: [id number] Date of Birth/Sex: 1946/02/11 (70 y.o. Male) Treating RN: Phillis Haggis Primary Care Physician: Oretha Milch Other Clinician: Referring Physician: Oretha Milch Treating Physician/Extender: Rudene Re in Treatment: 12 Encounter  Discharge Information Items Discharge Pain Level: 0 Discharge Condition: Stable Ambulatory Status: Wheelchair Discharge Destination: Nursing Home Transportation: Other Accompanied By: self Schedule Follow-up Appointment: Yes Medication Reconciliation completed and provided to Patient/Care Yes Burtis Imhoff: Provided on Clinical Summary of Care: 03/29/2016 Form Type Recipient Paper Patient EL Electronic Signature(s) Signed: 03/29/2016 11:23:53 AM By: Gwenlyn Perking Entered By: Gwenlyn Perking on 03/29/2016 11:23:52 [name] ([id number]) -------------------------------------------------------------------------------- Lower Extremity Assessment Details Patient Name: [name] Date of Service: 03/29/2016 10:45 AM Medical Record Number: [id number] Patient Account Number: [id number] Date of Birth/Sex: 07/14/1946 (70 y.o. Male) Treating RN: Phillis Haggis Primary Care Physician: Oretha Milch Other Clinician: Referring Physician: Oretha Milch Treating Physician/Extender: Rudene Re in Treatment: 12 Edema Assessment Assessed: [Left: No] [Right: No] E[Left: dema] [Right: :] Calf Left: Right: Point of Measurement: cm From Medial Instep 32.8 cm 30.4 cm Ankle Left: Right: Point of Measurement: cm From Medial Instep 22.2 cm 23.2 cm Vascular Assessment Pulses: Posterior Tibial Dorsalis Pedis Palpable: [Left:Yes] [Right:Yes] Extremity colors, hair growth, and conditions: Extremity Color: [Left:Hyperpigmented] [Right:Hyperpigmented] Temperature of Extremity: [Left:Warm] [Right:Warm] Capillary Refill: [Left:> 3 seconds] [Right:> 3 seconds] Toe Nail Assessment Left: Right: Thick: Yes Yes Discolored: Yes Yes Deformed: Yes Yes Improper Length and Hygiene: Yes Yes Electronic Signature(s) Signed: 03/29/2016 4:07:37 PM By: Alejandro Mulling Entered By: Alejandro Mulling on 03/29/2016 10:25:07 [name]  ([id number]) -------------------------------------------------------------------------------- Multi Wound Chart Details Patient Name: [name] Date of Service: 03/29/2016 10:45 AM Medical Record Number: [id number] Patient Account Number: [id number] Date of Birth/Sex: [birth year] (69 y.o. Male) Treating RN: Phillis Haggis Primary Care Physician: Oretha Milch Other Clinician: Referring Physician: Oretha Milch Treating Physician/Extender: Rudene Re in Treatment: 12 Vital Signs Height(in): 70 Pulse(bpm): 93 Weight(lbs): 235 Blood Pressure 108/56 (mmHg): Body Mass Index(BMI): 34 Temperature(F): Respiratory Rate 18 (breaths/min): Photos: [1:No Photos] [2:No  Photos] [3:No Photos] Wound Location: [1:Left Lower Leg] [2:Right Lower Leg] [3:Right Calcaneus] Wounding Event: [1:Gradually Appeared] [2:Gradually Appeared] [3:Pressure Injury] Primary Etiology: [1:Venous Leg Ulcer] [2:Venous Leg Ulcer] [3:Pressure Ulcer] Comorbid History: [1:Cataracts, Chronic Obstructive Pulmonary Disease (COPD), Sleep Disease (COPD), Sleep Disease (COPD), Sleep Apnea, Angina, Congestive Heart Failure, Congestive Heart Failure, Congestive Heart Failure, Hypertension, Type II Diabetes,  Gout, Osteoarthritis, Neuropathy Osteoarthritis, Neuropathy Osteoarthritis, Neuropathy] [2:Cataracts, Chronic Obstructive Pulmonary Apnea, Angina, Hypertension, Type II Diabetes, Gout,] [3:Cataracts, Chronic Obstructive Pulmonary Apnea, Angina,  Hypertension, Type II Diabetes, Gout,] Date Acquired: [1:12/31/2014] [2:12/31/2014] [3:10/31/2015] Weeks of Treatment: [1:12] [2:12] [3:12] Wound Status: [1:Open] [2:Open] [3:Open] Measurements L x W x D 5x3x0.1 [2:0.1x0.1x0.1] [3:2.5x3.5x0.1] (cm) Area (cm) : [1:11.781] [2:0.008] [3:6.872] Volume (cm) : [1:1.178] [2:0.001] [3:0.687] % Reduction in Area: [1:51.90%] [2:96.60%] [3:2.80%] % Reduction in Volume: 51.90% [2:95.80%] [3:51.40%] Classification: [1:Partial  Thickness] [2:Partial Thickness] [3:Category/Stage III] HBO Classification: [1:Grade 1] [2:Grade 1] [3:Grade 1] Exudate Amount: [1:Large] [2:None Present] [3:Large] Exudate Type: [1:Serous] [2:N/A] [3:Serous] Exudate Color: [1:amber] [2:N/A] [3:amber] Wound Margin: [1:Flat and Intact] [2:Flat and Intact] [3:Flat and Intact] Granulation Amount: [1:Large (67-100%)] [2:Large (67-100%)] [3:Large (67-100%)] Granulation Quality: [1:Pink] [2:Red] [3:Red, Pink] Necrotic Amount: [1:None Present (0%)] [2:None Present (0%)] [3:Small (1-33%)] Necrotic Tissue: N/A N/A Eschar, Adherent Slough Exposed Structures: Fascia: No Fascia: No Fascia: No Fat: No Fat: No Fat: No Tendon: No Tendon: No Tendon: No Muscle: No Muscle: No Muscle: No Joint: No Joint: No Joint: No Bone: No Bone: No Bone: No Limited to Skin Limited to Skin Limited to Skin Breakdown Breakdown Breakdown Epithelialization: Large (67-100%) Large (67-100%) None Periwound Skin Texture: Edema: Yes Edema: No Edema: Yes Periwound Skin Moist: Yes Moist: No Maceration: Yes Moisture: Moist: Yes Periwound Skin Color: Erythema: Yes Erythema: Yes Erythema: Yes Erythema Location: Circumferential Circumferential Circumferential Temperature: No Abnormality No Abnormality No Abnormality Tenderness on Yes Yes Yes Palpation: Wound Preparation: Ulcer Cleansing: Other: Ulcer Cleansing: Other: Ulcer Cleansing: soap and water soap and water Rinsed/Irrigated with Saline Topical Anesthetic Topical Anesthetic Applied: None Applied: None Topical Anesthetic Applied: Other: lidocaine 4% Wound Number: 6 7 N/A Photos: No Photos No Photos N/A Wound Location: Right Foot - Lateral Coccyx - Midline N/A Wounding Event: Pressure Injury Pressure Injury N/A Primary Etiology: Pressure Ulcer Pressure Ulcer N/A Comorbid History: Cataracts, Chronic Cataracts, Chronic N/A Obstructive Pulmonary Obstructive Pulmonary Disease (COPD), Sleep Disease (COPD),  Sleep Apnea, Angina, Apnea, Angina, Congestive Heart Failure, Congestive Heart Failure, Hypertension, Type II Hypertension, Type II Diabetes, Gout, Diabetes, Gout, Osteoarthritis, Neuropathy Osteoarthritis, Neuropathy Date Acquired: 02/16/2016 02/23/2016 N/A Weeks of Treatment: 6 5 N/A Wound Status: Open Open N/A Measurements L x W x D 0.5x0.5x0.1 4x3x0.1 N/A (cm) Area (cm) : 0.196 9.425 N/A Volume (cm) : 0.02 0.942 N/A % Reduction in Area: -24.80% 60.30% N/A % Reduction in Volume: -25.00% 60.40% N/A Classification: Category/Stage II Category/Stage II N/A HBO Classification: Grade 1 N/A N/A [name], [name] ([id number]) Exudate Amount: Large Small N/A Exudate Type: Serosanguineous Serous N/A Exudate Color: red, brown amber N/A Wound Margin: Flat and Intact Flat and Intact N/A Granulation Amount: None Present (0%) Large (67-100%) N/A Granulation Quality: N/A Red N/A Necrotic Amount: Large (67-100%) None Present (0%) N/A Necrotic Tissue: Eschar, Adherent Slough N/A N/A Exposed Structures: Fascia: No Fascia: No N/A Fat: No Fat: No Tendon: No Tendon: No Muscle: No Muscle: No Joint: No Joint: No Bone: No Bone: No Limited to Skin Limited to Skin Breakdown Breakdown Epithelialization: None N/A N/A Periwound Skin Texture: No Abnormalities Noted Edema: No N/A  Excoriation: No Induration: No Callus: No Crepitus: No Fluctuance: No Friable: No Rash: No Scarring: No Periwound Skin Moist: Yes Moist: Yes N/A Moisture: Maceration: No Dry/Scaly: No Periwound Skin Color: No Abnormalities Noted Atrophie Blanche: No N/A Cyanosis: No Ecchymosis: No Erythema: No Hemosiderin Staining: No Mottled: No Pallor: No Rubor: No Erythema Location: N/A N/A N/A Temperature: No Abnormality No Abnormality N/A Tenderness on Yes Yes N/A Palpation: Wound Preparation: Ulcer Cleansing: Ulcer Cleansing: N/A Rinsed/Irrigated with Rinsed/Irrigated with Saline Saline Topical Anesthetic  Topical Anesthetic Applied: Other: lidocaine Applied: None 4% Treatment Notes [name], [name] ([id number]) Electronic Signature(s) Signed: 03/29/2016 4:07:37 PM By: Alejandro Mulling Entered By: Alejandro Mulling on 03/29/2016 10:41:46 [name] ([id number]) -------------------------------------------------------------------------------- Multi-Disciplinary Care Plan Details Patient Name: [name] Date of Service: 03/29/2016 10:45 AM Medical Record Number: [id number] Patient Account Number: [id number] Date of Birth/Sex: 10-12-1946 (70 y.o. Male) Treating RN: Phillis Haggis Primary Care Physician: Oretha Milch Other Clinician: Referring Physician: Oretha Milch Treating Physician/Extender: Rudene Re in Treatment: 12 Active Inactive Abuse / Safety / Falls / Self Care Management Nursing Diagnoses: Potential for falls Goals: Patient will remain injury free Date Initiated: 03/01/2016 Goal Status: Active Interventions: Assess fall risk on admission and as needed Notes: Nutrition Nursing Diagnoses: Imbalanced nutrition Goals: Patient/caregiver agrees to and verbalizes understanding of need to use nutritional supplements and/or vitamins as prescribed Date Initiated: 03/01/2016 Goal Status: Active Interventions: Assess patient nutrition upon admission and as needed per policy Notes: Orientation to the Wound Care Program Nursing Diagnoses: Knowledge deficit related to the wound healing center program Goals: Patient/caregiver will verbalize understanding of the [facility]aling [facility] [id number] [place], [place] ([id number]) Date Initiated: 03/01/2016 Goal Status: Active Interventions: Provide education on orientation to the wound center Notes: Pain, Acute or Chronic Nursing Diagnoses: Pain, acute or chronic: actual or potential Potential alteration in comfort, pain Goals: Patient will verbalize adequate pain control and receive pain control interventions  during procedures as needed Date Initiated: 03/01/2016 Goal Status: Active Patient/caregiver will verbalize adequate pain control between visits Date Initiated: 03/01/2016 Goal Status: Active Interventions: Assess comfort goal upon admission Complete pain assessment as per visit requirements Notes: Pressure Nursing Diagnoses: Knowledge deficit related to causes and risk factors for pressure ulcer development Knowledge deficit related to management of pressures ulcers Goals: Patient/caregiver will verbalize risk factors for pressure ulcer development Date Initiated: 03/01/2016 Goal Status: Active Interventions: Assess offloading mechanisms upon admission and as needed Notes: Wound/Skin Impairment Nursing Diagnoses: [name], [name] ([id number]) Impaired tissue integrity Goals: Ulcer/skin breakdown will have a volume reduction of 30% by week 4 Date Initiated: 03/01/2016 Goal Status: Active Ulcer/skin breakdown will have a volume reduction of 50% by week 8 Date Initiated: 03/01/2016 Goal Status: Active Ulcer/skin breakdown will have a volume reduction of 80% by week 12 Date Initiated: 03/01/2016 Goal Status: Active Interventions: Assess ulceration(s) every visit Notes: Electronic Signature(s) Signed: 03/29/2016 4:07:37 PM By: Alejandro Mulling Entered By: Alejandro Mulling on 03/29/2016 10:41:35 [name], [name] ([id number]) -------------------------------------------------------------------------------- Pain Assessment Details Patient Name: [name] Date of Service: 03/29/2016 10:45 AM Medical Record Number: [id number] Patient Account Number: [id number] Date of Birth/Sex: 1946/07/26 (70 y.o. Male) Treating RN: Phillis Haggis Primary Care Physician: Oretha Milch Other Clinician: Referring Physician: Oretha Milch Treating Physician/Extender: Rudene Re in Treatment: 12 Active Problems Location of Pain Severity and Description of Pain Patient Has Paino  No Site Locations Pain Management and Medication Current Pain Management: Electronic Signature(s) Signed: 03/29/2016 4:07:37 PM By: Alejandro Mulling Entered By: Alejandro Mulling  on 03/29/2016 10:16:35 [name], [name] ([id number]) -------------------------------------------------------------------------------- Patient/Caregiver Education Details Patient Name: [name] Date of Service: 03/29/2016 10:45 AM Medical Record Number: [id number] Patient Account Number: [id number] Date of Birth/Gender: 03/07/1946 (70 y.o. Male) Treating RN: Phillis Haggis Primary Care Physician: Oretha Milch Other Clinician: Referring Physician: Oretha Milch Treating Physician/Extender: Rudene Re in Treatment: 12 Education Assessment Education Provided To: Patient Education Topics Provided Wound/Skin Impairment: Handouts: Other: do not get wraps wet and change heel dressing as ordered Methods: Demonstration, Explain/Verbal Responses: State content correctly Electronic Signature(s) Signed: 03/29/2016 4:07:37 PM By: Alejandro Mulling Entered By: Alejandro Mulling on 03/29/2016 11:15:21 [name], [name] ([id number]) -------------------------------------------------------------------------------- Wound Assessment Details Patient Name: [name] Date of Service: 03/29/2016 10:45 AM Medical Record Number: [id number] Patient Account Number: [id number] Date of Birth/Sex: Dec 22, 1945 (70 y.o. Male) Treating RN: Phillis Haggis Primary Care Physician: Oretha Milch Other Clinician: Referring Physician: Oretha Milch Treating Physician/Extender: Rudene Re in Treatment: 12 Wound Status Wound Number: 1 Primary Venous Leg Ulcer Etiology: Wound Location: Left Lower Leg Wound Open Wounding Event: Gradually Appeared Status: Date Acquired: 12/31/2014 Comorbid Cataracts, Chronic Obstructive Weeks Of Treatment: 12 History: Pulmonary Disease (COPD), Sleep Clustered Wound: No Apnea,  Angina, Congestive Heart Failure, Hypertension, Type II Diabetes, Gout, Osteoarthritis, Neuropathy Photos Photo Uploaded By: Alejandro Mulling on 03/29/2016 15:52:25 Wound Measurements Length: (cm) 5 Width: (cm) 3 Depth: (cm) 0.1 Area: (cm) 11.781 Volume: (cm) 1.178 % Reduction in Area: 51.9% % Reduction in Volume: 51.9% Epithelialization: Large (67-100%) Tunneling: No Undermining: No Wound Description Classification: Partial Thickness Foul Odor A Diabetic Severity (Wagner): Grade 1 Wound Margin: Flat and Intact Exudate Amount: Large Exudate Type: Serous Exudate Color: amber fter Cleansing: No Wound Bed Granulation Amount: Large (67-100%) Exposed Structure [name], [name] ([id number]) Granulation Quality: Pink Fascia Exposed: No Necrotic Amount: None Present (0%) Fat Layer Exposed: No Tendon Exposed: No Muscle Exposed: No Joint Exposed: No Bone Exposed: No Limited to Skin Breakdown Periwound Skin Texture Texture Color No Abnormalities Noted: No No Abnormalities Noted: No Localized Edema: Yes Erythema: Yes Erythema Location: Circumferential Moisture No Abnormalities Noted: No Temperature / Pain Moist: Yes Temperature: No Abnormality Tenderness on Palpation: Yes Wound Preparation Ulcer Cleansing: Other: soap and water, Topical Anesthetic Applied: None Treatment Notes Wound #1 (Left Lower Leg) 1. Cleansed with: Cleanse wound with antibacterial soap and water 2. Anesthetic Topical Lidocaine 4% cream to wound bed prior to debridement 3. Peri-wound Care: Barrier cream Moisturizing lotion 4. Dressing Applied: Aquacel Ag 5. Secondary Dressing Applied ABD Pad 7. Secured with Tape 2 Layer Lite Compression System - Bilateral Electronic Signature(s) Signed: 03/29/2016 4:07:37 PM By: Alejandro Mulling Entered By: Alejandro Mulling on 03/29/2016 10:40:54 [name]  ([id number]) -------------------------------------------------------------------------------- Wound Assessment Details Patient Name: [name] Date of Service: 03/29/2016 10:45 AM Medical Record Number: [id number] Patient Account Number: [id number] Date of Birth/Sex: 06/27/1946 (70 y.o. Male) Treating RN: Phillis Haggis Primary Care Physician: Oretha Milch Other Clinician: Referring Physician: Oretha Milch Treating Physician/Extender: Rudene Re in Treatment: 12 Wound Status Wound Number: 2 Primary Venous Leg Ulcer Etiology: Wound Location: Right Lower Leg Wound Open Wounding Event: Gradually Appeared Status: Date Acquired: 12/31/2014 Comorbid Cataracts, Chronic Obstructive Weeks Of Treatment: 12 History: Pulmonary Disease (COPD), Sleep Clustered Wound: No Apnea, Angina, Congestive Heart Failure, Hypertension, Type II Diabetes, Gout, Osteoarthritis, Neuropathy Photos Photo Uploaded By: Alejandro Mulling on 03/29/2016 15:52:25 Wound Measurements Length: (cm) 0.1 Width: (cm) 0.1 Depth: (cm) 0.1 Area: (cm) 0.008 Volume: (cm) 0.001 % Reduction in Area: 96.6% %  Reduction in Volume: 95.8% Epithelialization: Large (67-100%) Tunneling: No Undermining: No Wound Description Classification: Partial Thickness Foul Odor A Diabetic Severity (Wagner): Grade 1 Wound Margin: Flat and Intact Exudate Amount: None Present fter Cleansing: No Wound Bed Granulation Amount: Large (67-100%) Exposed Structure Granulation Quality: Red Fascia Exposed: No Necrotic Amount: None Present (0%) Fat Layer Exposed: No [name], [name] V. ([id number]) Tendon Exposed: No Muscle Exposed: No Joint Exposed: No Bone Exposed: No Limited to Skin Breakdown Periwound Skin Texture Texture Color No Abnormalities Noted: No No Abnormalities Noted: No Localized Edema: No Erythema: Yes Erythema Location: Circumferential Moisture No Abnormalities Noted: No Temperature / Pain Moist:  No Temperature: No Abnormality Tenderness on Palpation: Yes Wound Preparation Ulcer Cleansing: Other: soap and water, Topical Anesthetic Applied: None Treatment Notes Wound #2 (Right Lower Leg) 1. Cleansed with: Cleanse wound with antibacterial soap and water 2. Anesthetic Topical Lidocaine 4% cream to wound bed prior to debridement 3. Peri-wound Care: Barrier cream Moisturizing lotion 4. Dressing Applied: Aquacel Ag 5. Secondary Dressing Applied ABD Pad 7. Secured with Tape 2 Layer Lite Compression System - Bilateral Electronic Signature(s) Signed: 03/29/2016 4:07:37 PM By: Alejandro Mulling Entered By: Alejandro Mulling on 03/29/2016 10:41:18 [name] ([id number]) -------------------------------------------------------------------------------- Wound Assessment Details Patient Name: [name] Date of Service: 03/29/2016 10:45 AM Medical Record Number: [id number] Patient Account Number: [id number] Date of Birth/Sex: 05/20/1946 (70 y.o. Male) Treating RN: Phillis Haggis Primary Care Physician: Oretha Milch Other Clinician: Referring Physician: Oretha Milch Treating Physician/Extender: Rudene Re in Treatment: 12 Wound Status Wound Number: 3 Primary Pressure Ulcer Etiology: Wound Location: Right Calcaneus Wound Open Wounding Event: Pressure Injury Status: Date Acquired: 10/31/2015 Comorbid Cataracts, Chronic Obstructive Weeks Of Treatment: 12 History: Pulmonary Disease (COPD), Sleep Clustered Wound: No Apnea, Angina, Congestive Heart Failure, Hypertension, Type II Diabetes, Gout, Osteoarthritis, Neuropathy Photos Photo Uploaded By: Alejandro Mulling on 03/29/2016 15:52:40 Wound Measurements Length: (cm) 2.5 Width: (cm) 3.5 Depth: (cm) 0.1 Area: (cm) 6.872 Volume: (cm) 0.687 % Reduction in Area: 2.8% % Reduction in Volume: 51.4% Epithelialization: None Tunneling: No Undermining: No Wound Description Classification: 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 ([id number]) Granulation Quality: Red, Pink Fascia Exposed: No Necrotic Amount: Small (1-33%) Fat Layer Exposed: No Necrotic Quality: Eschar, Adherent Slough Tendon Exposed: No Muscle Exposed: No Joint Exposed: No Bone Exposed: No Limited to Skin Breakdown Periwound Skin Texture Texture Color No Abnormalities Noted: No No Abnormalities Noted: No Localized Edema: Yes Erythema: Yes Erythema Location: Circumferential Moisture No Abnormalities Noted: No Temperature / Pain Maceration: Yes Temperature: No Abnormality Moist: Yes Tenderness on Palpation: Yes Wound Preparation Ulcer Cleansing: Rinsed/Irrigated with Saline Topical Anesthetic Applied: Other: lidocaine 4%, Treatment Notes Wound #3 (Right Calcaneus) 1. Cleansed with: Cleanse wound with antibacterial soap and water 2. Anesthetic Topical Lidocaine 4% cream to wound bed prior to debridement 3. Peri-wound Care: Barrier cream 4. Dressing Applied: Santyl Ointment 5. Secondary Dressing Applied Dry Gauze Foam Kerlix/Conform 7. Secured with Secretary/administrator) Signed: 03/29/2016 4:07:37 PM By: Alejandro Mulling Entered By: Alejandro Mulling on 03/29/2016 10:33:43 [name] ([id number]) -------------------------------------------------------------------------------- Wound Assessment Details Patient Name: [name] Date of Service: 03/29/2016 10:45 AM Medical Record Number: [id number] Patient Account Number: [id number] Date of Birth/Sex: [birth year] (70 y.o. Male) Treating RN: Phillis Haggis Primary Care Physician: Oretha Milch Other Clinician: Referring Physician: Katrinka Blazing,  [name] Treating Physician/Extender: Rudene Re in Treatment: 12 Wound  Status Wound Number: 6 Primary Pressure Ulcer Etiology: Wound Location: Right Foot - Lateral Wound Open Wounding Event: Pressure Injury Status: Date Acquired: 02/16/2016 Comorbid Cataracts, Chronic Obstructive Weeks Of Treatment: 6 History: Pulmonary Disease (COPD), Sleep Clustered Wound: No Apnea, Angina, Congestive Heart Failure, Hypertension, Type II Diabetes, Gout, Osteoarthritis, Neuropathy Photos Photo Uploaded By: Alejandro Mulling on 03/29/2016 15:52:40 Wound Measurements Length: (cm) 0.5 Width: (cm) 0.5 Depth: (cm) 0.1 Area: (cm) 0.196 Volume: (cm) 0.02 % Reduction in Area: -24.8% % Reduction in Volume: -25% Epithelialization: None Tunneling: No Undermining: No Wound Description Classification: Category/Stage II Diabetic Severity [address]): Grade 1 Wound Margin: Flat and Intact Exudate Amount: Large Exudate Type: Serosanguineous Exudate Color: red, brown Foul Odor After Cleansing: No Wound Bed Granulation Amount: None Present (0%) Exposed Structure Haubner, Beckham V. ([id number]) Necrotic Amount: Large (67-100%) Fascia Exposed: No Necrotic Quality: Eschar, Adherent Slough Fat Layer Exposed: No Tendon Exposed: No Muscle Exposed: No Joint Exposed: No Bone Exposed: No Limited to Skin Breakdown Periwound Skin Texture Texture Color No Abnormalities Noted: No No Abnormalities Noted: No Moisture Temperature / Pain No Abnormalities Noted: No Temperature: No Abnormality Moist: Yes Tenderness on Palpation: Yes Wound Preparation Ulcer Cleansing: Rinsed/Irrigated with Saline Topical Anesthetic Applied: Other: lidocaine 4%, Treatment Notes Wound #6 (Right, Lateral Foot) 1. Cleansed with: Cleanse wound with antibacterial soap and water 2. Anesthetic Topical Lidocaine 4% cream to wound bed prior to debridement 3. Peri-wound Care: Barrier cream Moisturizing lotion 4. Dressing Applied: Aquacel Ag 5. Secondary Dressing Applied Foam 7. Secured  with Tape 2 Layer Lite Compression System - Bilateral Electronic Signature(s) Signed: 03/29/2016 4:07:37 PM By: Alejandro Mulling Entered By: Alejandro Mulling on 03/29/2016 10:33:04 [name] ([id number]) -------------------------------------------------------------------------------- Wound Assessment Details Patient Name: [name] Date of Service: 03/29/2016 10:45 AM Medical Record Number: [id number] Patient Account Number: [id number] Date of Birth/Sex: 1945-12-01 (70 y.o. Male) Treating RN: Phillis Haggis Primary Care Physician: Oretha Milch Other Clinician: Referring Physician: Oretha Milch Treating Physician/Extender: Rudene Re in Treatment: 12 Wound Status Wound Number: 7 Primary Pressure Ulcer Etiology: Wound Location: Coccyx - Midline Wound Open Wounding Event: Pressure Injury Status: Date Acquired: 02/23/2016 Comorbid Cataracts, Chronic Obstructive Weeks Of Treatment: 5 History: Pulmonary Disease (COPD), Sleep Clustered Wound: No Apnea, Angina, Congestive Heart Failure, Hypertension, Type II Diabetes, Gout, Osteoarthritis, Neuropathy Photos Photo Uploaded By: Alejandro Mulling on 03/29/2016 15:52:56 Wound Measurements Length: (cm) 4 Width: (cm) 3 Depth: (cm) 0.1 Area: (cm) 9.425 Volume: (cm) 0.942 % Reduction in Area: 60.3% % Reduction in Volume: 60.4% Wound Description Classification: Category/Stage II Wound Margin: Flat and Intact Exudate Amount: Small Exudate Type: Serous Exudate Color: amber Foul Odor After Cleansing: No Wound Bed Granulation Amount: Large (67-100%) Exposed Structure Granulation Quality: Red Fascia Exposed: No Maish, Iniko V. ([id number]) Necrotic Amount: None Present (0%) Fat Layer Exposed: No Tendon Exposed: No Muscle Exposed: No Joint Exposed: No Bone Exposed: No Limited to Skin Breakdown Periwound Skin Texture Texture Color No Abnormalities Noted: No No Abnormalities Noted: No Callus:  No Atrophie Blanche: No Crepitus: No Cyanosis: No Excoriation: No Ecchymosis: No Fluctuance: No Erythema: No Friable: No Hemosiderin Staining: No Induration: No Mottled: No Localized Edema: No Pallor: No Rash: No Rubor: No Scarring: No Temperature / Pain Moisture Temperature: No Abnormality No Abnormalities Noted: No Tenderness on Palpation: Yes Dry / Scaly: No Maceration: No Moist: Yes Wound Preparation Ulcer Cleansing: Rinsed/Irrigated with Saline Topical Anesthetic Applied: None Treatment Notes Wound #7 (Midline Coccyx)  1. Cleansed with: Clean wound with Normal Saline 3. Peri-wound Care: Skin Prep 4. Dressing Applied: Aquacel Ag 5. Secondary Dressing Applied Bordered Foam Dressing Electronic Signature(s) Signed: 03/29/2016 4:07:37 PM By: Alejandro Mulling Entered By: Alejandro Mulling on 03/29/2016 10:22:09 [name] ([id number]) -------------------------------------------------------------------------------- Vitals Details Patient Name: [name] Date of Service: 03/29/2016 10:45 AM Medical Record Number: [id number] Patient Account Number: [id number] Date of Birth/Sex: June 14, 1946 (70 y.o. Male) Treating RN: Phillis Haggis Primary Care Physician: Oretha Milch Other Clinician: Referring Physician: Oretha Milch Treating Physician/Extender: Rudene Re in Treatment: 12 Vital Signs Time Taken: 10:23 Pulse (bpm): 93 Height (in): 70 Respiratory Rate (breaths/min): 18 Weight (lbs): 235 Blood Pressure (mmHg): 108/56 Body Mass Index (BMI): 33.7 Reference Range: 80 - 120 mg / dl Electronic Signature(s) Signed: 03/29/2016 4:07:37 PM By: Alejandro Mulling Entered By: Alejandro Mulling on 03/29/2016 10:23:44

## 2016-04-05 ENCOUNTER — Encounter: Payer: Medicare Other | Admitting: Surgery

## 2016-04-05 DIAGNOSIS — E11621 Type 2 diabetes mellitus with foot ulcer: Secondary | ICD-10-CM | POA: Diagnosis not present

## 2016-04-06 NOTE — Progress Notes (Signed)
ATLEE, KLUTH ([id number]) Visit Report for 04/05/2016 Arrival Information Details Patient Name: [name], [name] Date of Service: 04/05/2016 12:45 PM Medical Record Number: [id number] Patient Account Number: [id number] Date of Birth/Sex: [birth year] (70 y.o. Male) Treating RN: Phillis Haggis Primary Care Physician: Oretha Milch Other Clinician: Referring Physician: Oretha Milch Treating Physician/Extender: Rudene Re in Treatment: 13 Visit Information History Since Last Visit All ordered tests and consults were completed: No Patient Arrived: Wheel Chair Added or deleted any medications: No Arrival Time: 12:53 Any new allergies or adverse reactions: No Accompanied By: self Had a fall or experienced change in No Transfer Assistance: EasyP[employer] activities of daily living that may affect Patient Lift risk of falls: Patient Identification Verified: Yes Signs or symptoms of abuse/neglect since last No Secondary Verification Process Yes visito Completed: Hospitalized since last visit: No Patient Requires Transmission- No Pain Present Now: No Based Precautions: Patient Has Alerts: Yes Patient Alerts: DM II Electronic Signature(s) Signed: 04/05/2016 4:37:51 PM By: Alejandro Mulling Entered By: Alejandro Mulling on 04/05/2016 12:54:02 [name] ([id number]) -------------------------------------------------------------------------------- Encounter Discharge Information Details Patient Name: [name] Date of Service: 04/05/2016 12:45 PM Medical Record Number: [id number] Patient Account Number: [id number] Date of Birth/Sex: Jun 09, 1946 (70 y.o. Male) Treating RN: Phillis Haggis Primary Care Physician: Oretha Milch Other Clinician: Referring Physician: Oretha Milch Treating Physician/Extender: Rudene Re in Treatment: 67 Encounter Discharge Information Items Discharge Pain Level: 0 Discharge Condition: Stable Ambulatory Status: Wheelchair Discharge  Destination: Nursing Home Transportation: Other Accompanied By: self Schedule Follow-up Appointment: Yes Medication Reconciliation completed and provided to Patient/Care Yes Alaiza Yau: Provided on Clinical Summary of Care: 04/05/2016 Form Type Recipient Paper Patient EL Electronic Signature(s) Signed: 04/05/2016 1:57:55 PM By: Gwenlyn Perking Entered By: Gwenlyn Perking on 04/05/2016 13:57:55 Dacosta, Sadiq V. ([id number]) -------------------------------------------------------------------------------- Lower Extremity Assessment Details Patient Name: [name] Date of Service: 04/05/2016 12:45 PM Medical Record Number: [id number] Patient Account Number: [id number] Date of Birth/Sex: 08/10/1946 (70 y.o. Male) Treating RN: Phillis Haggis Primary Care Physician: Oretha Milch Other Clinician: Referring Physician: Oretha Milch Treating Physician/Extender: Rudene Re in Treatment: 13 Edema Assessment Assessed: [Left: No] [Right: No] E[Left: dema] [Right: :] Calf Left: Right: Point of Measurement: cm From Medial Instep 32.6 cm 30.4 cm Ankle Left: Right: Point of Measurement: cm From Medial Instep 22 cm 23 cm Vascular Assessment Pulses: Posterior Tibial Dorsalis Pedis Palpable: [Left:Yes] [Right:Yes] Extremity colors, hair growth, and conditions: Extremity Color: [Left:Red] [Right:Red] Temperature of Extremity: [Left:Warm] [Right:Warm] Capillary Refill: [Left:> 3 seconds] [Right:> 3 seconds] Toe Nail Assessment Left: Right: Thick: Yes Yes Discolored: Yes Yes Deformed: Yes Yes Improper Length and Hygiene: Yes Yes Electronic Signature(s) Signed: 04/05/2016 4:37:51 PM By: Alejandro Mulling Entered By: Alejandro Mulling on 04/05/2016 13:10:00 Handyside, Marvel Seth Bake ([id number]) -------------------------------------------------------------------------------- Multi Wound Chart Details Patient Name: [name] Date of Service: 04/05/2016 12:45 PM Medical Record Number:  [id number] Patient Account Number: [id number] Date of Birth/Sex: [birth year] (70 y.o. Male) Treating RN: Phillis Haggis Primary Care Physician: Oretha Milch Other Clinician: Referring Physician: Oretha Milch Treating Physician/Extender: Rudene Re in Treatment: 13 Vital Signs Height(in): 70 Pulse(bpm): 89 Weight(lbs): 235 Blood Pressure 114/65 (mmHg): Body Mass Index(BMI): 34 Temperature(F): Respiratory Rate 18 (breaths/min): Photos: [1:No Photos] [2:No Photos] [3:No Photos] Wound Location: [1:Left Lower Leg] [2:Right Lower Leg] [3:Right Calcaneus] Wounding Event: [1:Gradually Appeared] [2:Gradually Appeared] [3:Pressure Injury] Primary Etiology: [1:Venous Leg Ulcer] [2:Venous Leg Ulcer] [3:Pressure Ulcer] Comorbid History: [1:Cataracts, Chronic Obstructive Pulmonary Disease (COPD), Sleep Disease (COPD), Sleep Disease (COPD), Sleep Apnea, Angina, Congestive  Heart Failure, Congestive Heart Failure, Congestive Heart Failure, Hypertension, Type II Diabetes,  Gout, Osteoarthritis, Neuropathy Osteoarthritis, Neuropathy Osteoarthritis, Neuropathy] [2:Cataracts, Chronic Obstructive Pulmonary Apnea, Angina, Hypertension, Type II Diabetes, Gout,] [3:Cataracts, Chronic Obstructive Pulmonary Apnea, Angina,  Hypertension, Type II Diabetes, Gout,] Date Acquired: [1:12/31/2014] [2:12/31/2014] [3:10/31/2015] Weeks of Treatment: [1:13] [2:13] [3:13] Wound Status: [1:Open] [2:Open] [3:Open] Measurements L x W x D 5x3x0.1 [2:3x3x0.1] [3:1.8x2.5x0.1] (cm) Area (cm) : [1:11.781] [2:7.069] [3:3.534] Volume (cm) : [1:1.178] [2:0.707] [3:0.353] % Reduction in Area: [1:51.90%] [2:-2895.30%] [3:50.00%] % Reduction in Volume: 51.90% [2:-2845.80%] [3:75.00%] Classification: [1:Partial Thickness] [2:Partial Thickness] [3:Category/Stage III] HBO Classification: [1:Grade 1] [2:Grade 1] [3:Grade 1] Exudate Amount: [1:Large] [2:Large] [3:Large] Exudate Type: [1:Serous] [2:Serous] [3:Serous] Exudate  Color: [1:amber] [2:amber] [3:amber] Wound Margin: [1:Flat and Intact] [2:Flat and Intact] [3:Flat and Intact] Granulation Amount: [1:Large (67-100%)] [2:Large (67-100%)] [3:Medium (34-66%)] Granulation Quality: [1:Pink] [2:Red] [3:Red, Pink] Necrotic Amount: [1:None Present (0%)] [2:Small (1-33%)] [3:Medium (34-66%)] Necrotic Tissue: N/A Adherent Slough Adherent Slough Exposed Structures: Fascia: No Fascia: No Fascia: No Fat: No Fat: No Fat: No Tendon: No Tendon: No Tendon: No Muscle: No Muscle: No Muscle: No Joint: No Joint: No Joint: No Bone: No Bone: No Bone: No Limited to Skin Limited to Skin Limited to Skin Breakdown Breakdown Breakdown Epithelialization: Large (67-100%) Medium (34-66%) None Periwound Skin Texture: Edema: Yes Edema: No Edema: Yes Periwound Skin Moist: Yes Maceration: Yes Maceration: Yes Moisture: Moist: Yes Moist: Yes Periwound Skin Color: Erythema: Yes Erythema: Yes Erythema: Yes Erythema Location: Circumferential Circumferential Circumferential Temperature: No Abnormality No Abnormality No Abnormality Tenderness on Yes Yes Yes Palpation: Wound Preparation: Ulcer Cleansing: Other: Ulcer Cleansing: Other: Ulcer Cleansing: soap and water soap and water Rinsed/Irrigated with Saline Topical Anesthetic Topical Anesthetic Applied: None Applied: None Topical Anesthetic Applied: Other: lidocaine 4% Wound Number: 6 7 N/A Photos: No Photos No Photos N/A Wound Location: Right Foot - Lateral Coccyx - Midline N/A Wounding Event: Pressure Injury Pressure Injury N/A Primary Etiology: Pressure Ulcer Pressure Ulcer N/A Comorbid History: Cataracts, Chronic Cataracts, Chronic N/A Obstructive Pulmonary Obstructive Pulmonary Disease (COPD), Sleep Disease (COPD), Sleep Apnea, Angina, Apnea, Angina, Congestive Heart Failure, Congestive Heart Failure, Hypertension, Type II Hypertension, Type II Diabetes, Gout, Diabetes, Gout, Osteoarthritis, Neuropathy  Osteoarthritis, Neuropathy Date Acquired: 02/16/2016 02/23/2016 N/A Weeks of Treatment: 7 6 N/A Wound Status: Open Open N/A Measurements L x W x D 0.4x0.4x0.2 3x2x0.1 N/A (cm) Area (cm) : 0.126 4.712 N/A Volume (cm) : 0.025 0.471 N/A % Reduction in Area: 19.70% 80.20% N/A % Reduction in Volume: -56.20% 80.20% N/A Classification: Category/Stage II Category/Stage II N/A HBO Classification: Grade 1 N/A N/A Dubois, Orvan V. ([id number]) Exudate Amount: Large Medium N/A Exudate Type: Serosanguineous Serous N/A Exudate Color: red, brown amber N/A Wound Margin: Flat and Intact Flat and Intact N/A Granulation Amount: None Present (0%) Large (67-100%) N/A Granulation Quality: N/A Red N/A Necrotic Amount: Large (67-100%) None Present (0%) N/A Necrotic Tissue: Eschar, Adherent Slough N/A N/A Exposed Structures: Fascia: No Fascia: No N/A Fat: No Fat: No Tendon: No Tendon: No Muscle: No Muscle: No Joint: No Joint: No Bone: No Bone: No Limited to Skin Limited to Skin Breakdown Breakdown Epithelialization: None None N/A Periwound Skin Texture: No Abnormalities Noted Edema: No N/A Excoriation: No Induration: No Callus: No Crepitus: No Fluctuance: No Friable: No Rash: No Scarring: No Periwound Skin Moist: Yes Moist: Yes N/A Moisture: Maceration: No Dry/Scaly: No Periwound Skin Color: No Abnormalities Noted Atrophie Blanche: No N/A Cyanosis: No Ecchymosis: No Erythema: No Hemosiderin Staining: No Mottled: No  Pallor: No Rubor: No Erythema Location: N/A N/A N/A Temperature: No Abnormality No Abnormality N/A Tenderness on Yes Yes N/A Palpation: Wound Preparation: Ulcer Cleansing: Ulcer Cleansing: N/A Rinsed/Irrigated with Rinsed/Irrigated with Saline Saline Topical Anesthetic Topical Anesthetic Applied: Other: lidocaine Applied: None 4% Treatment Notes BECKHEM, ISADORE ([id number]) Electronic Signature(s) Signed: 04/05/2016 4:37:51 PM By: Alejandro Mulling Entered By: Alejandro Mulling on 04/05/2016 13:20:45 [name] ([id number]) -------------------------------------------------------------------------------- Multi-Disciplinary Care Plan Details Patient Name: [name] Date of Service: 04/05/2016 12:45 PM Medical Record Number: [id number] Patient Account Number: [id number] Date of Birth/Sex: Mar 29, 1946 (70 y.o. Male) Treating RN: Phillis Haggis Primary Care Physician: Oretha Milch Other Clinician: Referring Physician: Oretha Milch Treating Physician/Extender: Rudene Re in Treatment: 28 Active Inactive Abuse / Safety / Falls / Self Care Management Nursing Diagnoses: Potential for falls Goals: Patient will remain injury free Date Initiated: 03/01/2016 Goal Status: Active Interventions: Assess fall risk on admission and as needed Notes: Nutrition Nursing Diagnoses: Imbalanced nutrition Goals: Patient/caregiver agrees to and verbalizes understanding of need to use nutritional supplements and/or vitamins as prescribed Date Initiated: 03/01/2016 Goal Status: Active Interventions: Assess patient nutrition upon admission and as needed per policy Notes: Orientation to the Wound Care Program Nursing Diagnoses: Knowledge deficit related to the wound healing center program Goals: Patient/caregiver will verbalize understanding of the [facility]aling [facility] [address] ABED, SCHAR ([id number]) Date Initiated: 03/01/2016 Goal Status: Active Interventions: Provide education on orientation to the wound center Notes: Pain, Acute or Chronic Nursing Diagnoses: Pain, acute or chronic: actual or potential Potential alteration in comfort, pain Goals: Patient will verbalize adequate pain control and receive pain control interventions during procedures as needed Date Initiated: 03/01/2016 Goal Status: Active Patient/caregiver will verbalize adequate pain control between visits Date Initiated: 03/01/2016 Goal  Status: Active Interventions: Assess comfort goal upon admission Complete pain assessment as per visit requirements Notes: Pressure Nursing Diagnoses: Knowledge deficit related to causes and risk factors for pressure ulcer development Knowledge deficit related to management of pressures ulcers Goals: Patient/caregiver will verbalize risk factors for pressure ulcer development Date Initiated: 03/01/2016 Goal Status: Active Interventions: Assess offloading mechanisms upon admission and as needed Notes: Wound/Skin Impairment Nursing Diagnoses: ISAIC, SYLER ([id number]) Impaired tissue integrity Goals: Ulcer/skin breakdown will have a volume reduction of 30% by week 4 Date Initiated: 03/01/2016 Goal Status: Active Ulcer/skin breakdown will have a volume reduction of 50% by week 8 Date Initiated: 03/01/2016 Goal Status: Active Ulcer/skin breakdown will have a volume reduction of 80% by week 12 Date Initiated: 03/01/2016 Goal Status: Active Interventions: Assess ulceration(s) every visit Notes: Electronic Signature(s) Signed: 04/05/2016 4:37:51 PM By: Alejandro Mulling Entered By: Alejandro Mulling on 04/05/2016 13:20:39 Ewton, Delwin Seth Bake ([id number]) -------------------------------------------------------------------------------- Pain Assessment Details Patient Name: [name] Date of Service: 04/05/2016 12:45 PM Medical Record Number: [id number] Patient Account Number: [id number] Date of Birth/Sex: May 18, 1946 (70 y.o. Male) Treating RN: Phillis Haggis Primary Care Physician: Oretha Milch Other Clinician: Referring Physician: Oretha Milch Treating Physician/Extender: Rudene Re in Treatment: 13 Active Problems Location of Pain Severity and Description of Pain Patient Has Paino No Site Locations Pain Management and Medication Current Pain Management: Electronic Signature(s) Signed: 04/05/2016 4:37:51 PM By: Alejandro Mulling Entered By: Alejandro Mulling on  04/05/2016 12:54:09 [name] ([id number]) -------------------------------------------------------------------------------- Patient/Caregiver Education Details Patient Name: [name] Date of Service: 04/05/2016 12:45 PM Medical Record Number: [id number] Patient Account Number: [id number] Date of Birth/Gender: 10-Aug-1946 (70 y.o. Male) Treating RN: Phillis Haggis Primary Care Physician: Oretha Milch Other Clinician: Referring  Physician: Oretha Milch Treating Physician/Extender: Rudene Re in Treatment: 13 Education Assessment Education Provided To: Patient Education Topics Provided Wound/Skin Impairment: Handouts: Other: keep wraps dry Methods: Demonstration Responses: See progress note, State content correctly Electronic Signature(s) Signed: 04/05/2016 4:37:51 PM By: Alejandro Mulling Entered By: Alejandro Mulling on 04/05/2016 13:20:21 Leandro, Jabar Seth Bake ([id number]) -------------------------------------------------------------------------------- Wound Assessment Details Patient Name: [name] Date of Service: 04/05/2016 12:45 PM Medical Record Number: [id number] Patient Account Number: [id number] Date of Birth/Sex: 17-Mar-1946 (70 y.o. Male) Treating RN: Phillis Haggis Primary Care Physician: Oretha Milch Other Clinician: Referring Physician: Oretha Milch Treating Physician/Extender: Rudene Re in Treatment: 13 Wound Status Wound Number: 1 Primary Venous Leg Ulcer Etiology: Wound Location: Left Lower Leg Wound Open Wounding Event: Gradually Appeared Status: Date Acquired: 12/31/2014 Comorbid Cataracts, Chronic Obstructive Weeks Of Treatment: 13 History: Pulmonary Disease (COPD), Sleep Clustered Wound: No Apnea, Angina, Congestive Heart Failure, Hypertension, Type II Diabetes, Gout, Osteoarthritis, Neuropathy Photos Photo Uploaded By: Alejandro Mulling on 04/05/2016 16:26:41 Wound Measurements Length: (cm) 5 Width: (cm) 3 Depth:  (cm) 0.1 Area: (cm) 11.781 Volume: (cm) 1.178 % Reduction in Area: 51.9% % Reduction in Volume: 51.9% Epithelialization: Large (67-100%) Tunneling: No Undermining: No Wound Description Classification: Partial Thickness Foul Odor A Diabetic Severity (Wagner): Grade 1 Wound Margin: Flat and Intact Exudate Amount: Large Exudate Type: Serous Exudate Color: amber fter Cleansing: No Wound Bed Granulation Amount: Large (67-100%) Exposed Structure Dandy, Loukas V. ([id number]) Granulation Quality: Pink Fascia Exposed: No Necrotic Amount: None Present (0%) Fat Layer Exposed: No Tendon Exposed: No Muscle Exposed: No Joint Exposed: No Bone Exposed: No Limited to Skin Breakdown Periwound Skin Texture Texture Color No Abnormalities Noted: No No Abnormalities Noted: No Localized Edema: Yes Erythema: Yes Erythema Location: Circumferential Moisture No Abnormalities Noted: No Temperature / Pain Moist: Yes Temperature: No Abnormality Tenderness on Palpation: Yes Wound Preparation Ulcer Cleansing: Other: soap and water, Topical Anesthetic Applied: None Treatment Notes Wound #1 (Left Lower Leg) 1. Cleansed with: Cleanse wound with antibacterial soap and water 2. Anesthetic Topical Lidocaine 4% cream to wound bed prior to debridement 3. Peri-wound Care: Barrier cream 4. Dressing Applied: Aquacel Ag 5. Secondary Dressing Applied ABD Pad 7. Secured with Tape 2 Layer Lite Compression System - Bilateral Electronic Signature(s) Signed: 04/05/2016 4:37:51 PM By: Alejandro Mulling Entered By: Alejandro Mulling on 04/05/2016 13:13:02 [name] ([id number]) -------------------------------------------------------------------------------- Wound Assessment Details Patient Name: [name] Date of Service: 04/05/2016 12:45 PM Medical Record Number: [id number] Patient Account Number: [id number] Date of Birth/Sex: 12/23/1945 (70 y.o. Male) Treating RN: Phillis Haggis Primary Care Physician: Oretha Milch Other Clinician: Referring Physician: Oretha Milch Treating Physician/Extender: Rudene Re in Treatment: 13 Wound Status Wound Number: 2 Primary Venous Leg Ulcer Etiology: Wound Location: Right Lower Leg Wound Open Wounding Event: Gradually Appeared Status: Date Acquired: 12/31/2014 Comorbid Cataracts, Chronic Obstructive Weeks Of Treatment: 13 History: Pulmonary Disease (COPD), Sleep Clustered Wound: No Apnea, Angina, Congestive Heart Failure, Hypertension, Type II Diabetes, Gout, Osteoarthritis, Neuropathy Photos Photo Uploaded By: Alejandro Mulling on 04/05/2016 16:26:42 Wound Measurements Length: (cm) 3 Width: (cm) 3 Depth: (cm) 0.1 Area: (cm) 7.069 Volume: (cm) 0.707 % Reduction in Area: -2895.3% % Reduction in Volume: -2845.8% Epithelialization: Medium (34-66%) Tunneling: No Undermining: No Wound Description Classification: Partial Thickness Foul Odor Af Diabetic Severity [address]): Grade 1 Wound Margin: Flat and Intact Exudate Amount: Large Exudate Type: Serous Exudate Color: amber ter Cleansing: No Wound Bed Granulation Amount: Large (67-100%) Exposed Structure Morace, Ebony V. ([id number]) Granulation Quality: Red  Fascia Exposed: No Necrotic Amount: Small (1-33%) Fat Layer Exposed: No Necrotic Quality: Adherent Slough Tendon Exposed: No Muscle Exposed: No Joint Exposed: No Bone Exposed: No Limited to Skin Breakdown Periwound Skin Texture Texture Color No Abnormalities Noted: No No Abnormalities Noted: No Localized Edema: No Erythema: Yes Erythema Location: Circumferential Moisture No Abnormalities Noted: No Temperature / Pain Maceration: Yes Temperature: No Abnormality Moist: Yes Tenderness on Palpation: Yes Wound Preparation Ulcer Cleansing: Other: soap and water, Topical Anesthetic Applied: None Treatment Notes Wound #2 (Right Lower Leg) 1. Cleansed with: Cleanse wound with  antibacterial soap and water 2. Anesthetic Topical Lidocaine 4% cream to wound bed prior to debridement 3. Peri-wound Care: Barrier cream 4. Dressing Applied: Aquacel Ag 5. Secondary Dressing Applied ABD Pad 7. Secured with Tape 2 Layer Lite Compression System - Bilateral Electronic Signature(s) Signed: 04/05/2016 4:37:51 PM By: Alejandro MullingPinkerton, Debra Entered By: Alejandro MullingPinkerton, Debra on 04/05/2016 13:17:20 Glotfelty, Royston V. ([id number]) -------------------------------------------------------------------------------- Wound Assessment Details Patient Name: [name] OttoLINDLEY, Deanglo V. Date of Service: 04/05/2016 12:45 PM Medical Record Number: [id number] Patient Account Number: [id number] Date of Birth/Sex: 1946-10-31 72(70 y.o. Male) Treating RN: Phillis HaggisPinkerton, Debi Primary Care Physician: Oretha MilchSMITH, [name] Other Clinician: Referring Physician: Oretha MilchSMITH, [name] Treating Physician/Extender: Rudene ReBritto, Errol Weeks in Treatment: 13 Wound Status Wound Number: 3 Primary Pressure Ulcer Etiology: Wound Location: Right Calcaneus Wound Open Wounding Event: Pressure Injury Status: Date Acquired: 10/31/2015 Comorbid Cataracts, Chronic Obstructive Weeks Of Treatment: 13 History: Pulmonary Disease (COPD), Sleep Clustered Wound: No Apnea, Angina, Congestive Heart Failure, Hypertension, Type II Diabetes, Gout, Osteoarthritis, Neuropathy Photos Photo Uploaded By: Alejandro MullingPinkerton, Debra on 04/05/2016 16:26:56 Wound Measurements Length: (cm) 1.8 Width: (cm) 2.5 Depth: (cm) 0.1 Area: (cm) 3.534 Volume: (cm) 0.353 % Reduction in Area: 50% % Reduction in Volume: 75% Epithelialization: None Tunneling: No Undermining: No Wound Description Classification: Category/Stage III Foul Odor Diabetic Severity (Wagner): Grade 1 Wound Margin: Flat and Intact Exudate Amount: Large Exudate Type: Serous Exudate Color: amber After Cleansing: No Wound Bed Granulation Amount: Medium (34-66%) Exposed Structure Wimbish, Reeve V.  ([id number]) Granulation Quality: Red, Pink Fascia Exposed: No Necrotic Amount: Medium (34-66%) Fat Layer Exposed: No Necrotic Quality: Adherent Slough Tendon Exposed: No Muscle Exposed: No Joint Exposed: No Bone Exposed: No Limited to Skin Breakdown Periwound Skin Texture Texture Color No Abnormalities Noted: No No Abnormalities Noted: No Localized Edema: Yes Erythema: Yes Erythema Location: Circumferential Moisture No Abnormalities Noted: No Temperature / Pain Maceration: Yes Temperature: No Abnormality Moist: Yes Tenderness on Palpation: Yes Wound Preparation Ulcer Cleansing: Rinsed/Irrigated with Saline Topical Anesthetic Applied: Other: lidocaine 4%, Treatment Notes Wound #3 (Right Calcaneus) 1. Cleansed with: Cleanse wound with antibacterial soap and water 2. Anesthetic Topical Lidocaine 4% cream to wound bed prior to debridement 3. Peri-wound Care: Barrier cream 4. Dressing Applied: Santyl Ointment 5. Secondary Dressing Applied ABD Pad Kerlix/Conform 7. Secured with Secretary/administratorTape Electronic Signature(s) Signed: 04/05/2016 4:37:51 PM By: Alejandro MullingPinkerton, Debra Entered By: Alejandro MullingPinkerton, Debra on 04/05/2016 13:12:41 [name] OttoLINDLEY, Deval V. ([id number]) -------------------------------------------------------------------------------- Wound Assessment Details Patient Name: [name] OttoLINDLEY, Jafet V. Date of Service: 04/05/2016 12:45 PM Medical Record Number: [id number] Patient Account Number: [id number] Date of Birth/Sex: 1946-10-31 [age] y.o. Male) Treating RN: Phillis HaggisPinkerton, Debi Primary Care Physician: Oretha MilchSMITH, [name] Other Clinician: Referring Physician: Oretha MilchSMITH, [name] Treating Physician/Extender: Rudene ReBritto, Errol Weeks in Treatment: 13 Wound Status Wound Number: 6 Primary Pressure Ulcer Etiology: Wound Location: Right Foot - Lateral Wound Open Wounding Event: Pressure Injury Status: Date Acquired: 02/16/2016 Comorbid Cataracts, Chronic Obstructive Weeks Of Treatment: 7 History: Pulmonary Disease  (COPD), Sleep Clustered Wound: No  Apnea, Angina, Congestive Heart Failure, Hypertension, Type II Diabetes, Gout, Osteoarthritis, Neuropathy Photos Photo Uploaded By: Alejandro Mulling on 04/05/2016 16:26:56 Wound Measurements Length: (cm) 0.4 Width: (cm) 0.4 Depth: (cm) 0.2 Area: (cm) 0.126 Volume: (cm) 0.025 % Reduction in Area: 19.7% % Reduction in Volume: -56.2% Epithelialization: None Tunneling: No Undermining: No Wound Description Classification: Category/Stage II Diabetic Severity [address]): Grade 1 Wound Margin: Flat and Intact Exudate Amount: Large Exudate Type: Serosanguineous Exudate Color: red, brown Foul Odor After Cleansing: No Wound Bed Granulation Amount: None Present (0%) Exposed Structure Sutphin, Shedric V. ([id number]) Necrotic Amount: Large (67-100%) Fascia Exposed: No Necrotic Quality: Eschar, Adherent Slough Fat Layer Exposed: No Tendon Exposed: No Muscle Exposed: No Joint Exposed: No Bone Exposed: No Limited to Skin Breakdown Periwound Skin Texture Texture Color No Abnormalities Noted: No No Abnormalities Noted: No Moisture Temperature / Pain No Abnormalities Noted: No Temperature: No Abnormality Moist: Yes Tenderness on Palpation: Yes Wound Preparation Ulcer Cleansing: Rinsed/Irrigated with Saline Topical Anesthetic Applied: Other: lidocaine 4%, Treatment Notes Wound #6 (Right, Lateral Foot) 1. Cleansed with: Cleanse wound with antibacterial soap and water 2. Anesthetic Topical Lidocaine 4% cream to wound bed prior to debridement 3. Peri-wound Care: Barrier cream 4. Dressing Applied: Aquacel Ag 5. Secondary Dressing Applied Foam 7. Secured with Secretary/administrator) Signed: 04/05/2016 4:37:51 PM By: Alejandro Mulling Entered By: Alejandro Mulling on 04/05/2016 13:15:13 [name] ([id number]) -------------------------------------------------------------------------------- Wound Assessment Details Patient Name:  [name] Date of Service: 04/05/2016 12:45 PM Medical Record Number: [id number] Patient Account Number: [id number] Date of Birth/Sex: 12-25-1945 (70 y.o. Male) Treating RN: Phillis Haggis Primary Care Physician: Oretha Milch Other Clinician: Referring Physician: Oretha Milch Treating Physician/Extender: Rudene Re in Treatment: 13 Wound Status Wound Number: 7 Primary Pressure Ulcer Etiology: Wound Location: Coccyx - Midline Wound Open Wounding Event: Pressure Injury Status: Date Acquired: 02/23/2016 Comorbid Cataracts, Chronic Obstructive Weeks Of Treatment: 6 History: Pulmonary Disease (COPD), Sleep Clustered Wound: No Apnea, Angina, Congestive Heart Failure, Hypertension, Type II Diabetes, Gout, Osteoarthritis, Neuropathy Photos Photo Uploaded By: Alejandro Mulling on 04/05/2016 16:27:14 Wound Measurements Length: (cm) 3 Width: (cm) 2 Depth: (cm) 0.1 Area: (cm) 4.712 Volume: (cm) 0.471 % Reduction in Area: 80.2% % Reduction in Volume: 80.2% Epithelialization: None Tunneling: No Undermining: No Wound Description Classification: Category/Stage II Wound Margin: Flat and Intact Exudate Amount: Medium Exudate Type: Serous Exudate Color: amber Foul Odor After Cleansing: No Wound Bed Granulation Amount: Large (67-100%) Exposed Structure Granulation Quality: Red Fascia Exposed: No Kuehl, Zidan V. ([id number]) Necrotic Amount: None Present (0%) Fat Layer Exposed: No Tendon Exposed: No Muscle Exposed: No Joint Exposed: No Bone Exposed: No Limited to Skin Breakdown Periwound Skin Texture Texture Color No Abnormalities Noted: No No Abnormalities Noted: No Callus: No Atrophie Blanche: No Crepitus: No Cyanosis: No Excoriation: No Ecchymosis: No Fluctuance: No Erythema: No Friable: No Hemosiderin Staining: No Induration: No Mottled: No Localized Edema: No Pallor: No Rash: No Rubor: No Scarring: No Temperature / Pain Moisture  Temperature: No Abnormality No Abnormalities Noted: No Tenderness on Palpation: Yes Dry / Scaly: No Maceration: No Moist: Yes Wound Preparation Ulcer Cleansing: Rinsed/Irrigated with Saline Topical Anesthetic Applied: None Treatment Notes Wound #7 (Midline Coccyx) 1. Cleansed with: Clean wound with Normal Saline 2. Anesthetic Topical Lidocaine 4% cream to wound bed prior to debridement 3. Peri-wound Care: Skin Prep 4. Dressing Applied: Aquacel Ag 5. Secondary Dressing Applied Bordered Foam Dressing Electronic Signature(s) Signed: 04/05/2016 4:37:51 PM By: Alejandro Mulling Entered By: Alejandro Mulling on 04/05/2016 13:10:32 Matsunaga,  Kristy V. ([id number]) [name], Colman Seth Bake ([id number]) -------------------------------------------------------------------------------- Vitals Details Patient Name: [name] Date of Service: 04/05/2016 12:45 PM Medical Record Number: [id number] Patient Account Number: [id number] Date of Birth/Sex: 09-24-1946 (70 y.o. Male) Treating RN: Phillis Haggis Primary Care Physician: Oretha Milch Other Clinician: Referring Physician: Oretha Milch Treating Physician/Extender: Rudene Re in Treatment: 13 Vital Signs Time Taken: 12:54 Pulse (bpm): 89 Height (in): 70 Respiratory Rate (breaths/min): 18 Weight (lbs): 235 Blood Pressure (mmHg): 114/65 Body Mass Index (BMI): 33.7 Reference Range: 80 - 120 mg / dl Electronic Signature(s) Signed: 04/05/2016 4:37:51 PM By: Alejandro Mulling Entered By: Alejandro Mulling on 04/05/2016 12:54:26

## 2016-04-06 NOTE — Progress Notes (Signed)
[name] ([id number]) Visit Report for 04/05/2016 Chief Complaint Document Details Patient Name: [name], [name] Date of Service: 04/05/2016 12:45 PM Medical Record Number: [id number] Patient Account Number: [id number] Date of Birth/Sex: 23-Sep-1946 (70 y.o. Male) Treating RN: [name] Primary Care Physician: [name] Other Clinician: Referring Physician: [name] Treating Physician/Extender: [name] in Treatment: 13 Information Obtained from: Patient Chief Complaint Patient is at the clinic for treatment of an open pressure ulcer the left upper thigh and gluteal region and the right heel with bilateral swelling of the legs all of it which is going on for over a year Electronic Signature(s) Signed: 04/05/2016 1:28:43 PM By: [name] Kanner MD, FACS Entered By: [name] on 04/05/2016 13:28:43 [name] ([id number]) -------------------------------------------------------------------------------- Debridement Details Patient Name: [name] Date of Service: 04/05/2016 12:45 PM Medical Record Number: [id number] Patient Account Number: [id number] Date of Birth/Sex: Oct 16, 1946 (70 y.o. Male) Treating RN: [name] Primary Care Physician: [name] Other Clinician: Referring Physician: [name] Treating Physician/Extender: [name] in Treatment: 13 Debridement Performed for Wound #3 Right Calcaneus Assessment: Performed By: Physician [name] Kanner, MD Debridement: Debridement Pre-procedure Yes Verification/Time Out Taken: Start Time: 12:23 Pain Control: Lidocaine 4% Topical Solution Level: Skin/Subcutaneous Tissue Total Area Debrided (L x 1.8 (cm) x 2.5 (cm) = 4.5 (cm) W): Tissue and other Viable, Non-Viable, Exudate, Fibrin/Slough, Subcutaneous material debrided: Instrument: Curette Bleeding: Minimum Hemostasis Achieved: Pressure End Time: 13:26 Procedural Pain: 0 Post Procedural Pain: 0 Response to  Treatment: Procedure was tolerated well Post Debridement Measurements of Total Wound Length: (cm) 1.8 Stage: Category/Stage III Width: (cm) 2.5 Depth: (cm) 0.3 Volume: (cm) 1.06 Post Procedure Diagnosis Same as Pre-procedure Electronic Signature(s) Signed: 04/05/2016 1:28:34 PM By: [name] Kanner MD, FACS Signed: 04/05/2016 4:37:51 PM By: [name] Entered By: [name] on 04/05/2016 13:28:34 [name] ([id number]) -------------------------------------------------------------------------------- HPI Details Patient Name: [name] Date of Service: 04/05/2016 12:45 PM Medical Record Number: [id number] Patient Account Number: [id number] Date of Birth/Sex: 08-31-1946 (70 y.o. Male) Treating RN: [name] Primary Care Physician: [name] Other Clinician: Referring Physician: [name] Treating Physician/Extender: [name] in Treatment: 13 History of Present Illness Location: ulcerated area on the right heel, left gluteal region and thigh and then bilateral lower extremities Quality: Patient reports experiencing a sharp pain to affected area(s). Severity: Patient states wound are getting worse. Duration: Patient has had the wound for > 12 months prior to seeking treatment at the wound center Timing: Pain in wound is constant (hurts all the time) Context: The wound appeared gradually over time Modifying Factors: Other treatment(s) tried include: he sees his heart doctor and his primary care doctor Associated Signs and Symptoms: patient has not been able to walk for over a year now HPI Description: 70 year old gentleman with a known history of hypertension, diabetes, obstructive sleep apnea, COPD, diastolic CHF, coronary artery disease was admitted to the hospital with sepsis from an ulcer of the right heel and was treated there in October 2016. he also has chronic bilateral lower extremity edema and lymphedema. He had received vancomycin,  Zosyn and at that stage and x-rays showed hardware in the right ankle but no evidence of osteomyelitis. he was a former smoker. he was also treated with Augmentin orally for 10 days. He is either bedbound or wheelchair-bound and does not ambulate by himself. 01/19/2016 -- he has not been seen here for 3 weeks and this was because he was admitted to [facility]  between 223 and 01/08/2016 for sepsis, UTI and pneumonia involving the left lung. he was treated with IV vancomycin and Zosyn and then meropenem. Was discharged home on oral Bactrim for 2 weeks none of his vascular test or x-rays were done and we will reorder these. 01/26/2016 -- he has not yet done the x-ray of his foot and is vascular tests are still pending. I have asked him to work on these with his nursing home staff. Addendum: he has got an x-ray of the right foot done which shows that his osteopenia but no specific ostial lysis or abnormal periosteal reaction. Final impression was degenerative changes with dorsal foot soft tissue swelling. 02/16/2016 -- lower extremity arterial duplex examination shows a 50-99% stenosis of the right tibioperoneal trunk. He had biphasic flow in the right SFA, popliteal and tibioperoneal trunk. Left-sided he had triphasic flow throughout 02/29/2016 -- he is awaiting his vascular opinion with [name] which is to be done on April 24 03/08/2016 -- he has not kept his appointment with [name] on April 24 and does not seem to know what happened about this. it's difficult to gauge whether he is in full control of his mental faculties as at times he is extremely rude to the nursing staff. 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 Recommended that the patient had severe atherosclerotic changes of both lower extremities  associated with ulceration and tissue loss of the foot. This is a limb threatening ischemia and the patient was recommended to undergo [name] ([id number]) angiography of the lower extremity with a hope for intervention for limb salvage. Patient agreed and will proceed to angiography. He was admitted to the hospital between May 2 and 03/15/2016 - he underwent induction of a catheter into his right lower extremity and third order catheter placement with contrast injection to the right lower extremity for distal runoff. Percutaneous transluminal angioplasty of the right superficial femoral and popliteal arteries were done. He also had a right peroneal angioplasty. Patient had a postoperative hematoma and was admitted for observation and in the next 24 hours he was very agitated and combative and had to be seen by psychiatric. He had a follow-up with [name] in 3 weeks Electronic Signature(s) Signed: 04/05/2016 1:29:09 PM By: [name] Kanner MD, FACS Entered By: [name] on 04/05/2016 13:29:09 [name] ([id number]) -------------------------------------------------------------------------------- Physical Exam Details Patient Name: [name] Date of Service: 04/05/2016 12:45 PM Medical Record Number: [id number] Patient Account Number: [id number] Date of Birth/Sex: Oct 23, 1946 (70 y.o. Male) Treating RN: [name] Primary Care Physician: [name] Other Clinician: Referring Physician: [name] Treating Physician/Extender: [name] in Treatment: 13 Constitutional . Pulse regular. Respirations normal and unlabored. Afebrile. . Eyes Nonicteric. Reactive to light. Ears, Nose, Mouth, and Throat Lips, teeth, and gums WNL.Marland Kitchen Moist mucosa without lesions. Neck supple and nontender. No palpable supraclavicular or cervical adenopathy. Normal sized without goiter. Respiratory WNL. No retractions.. Cardiovascular Pedal Pulses WNL. No clubbing,  cyanosis or edema. Lymphatic No adneopathy. No adenopathy. No adenopathy. Musculoskeletal Adexa without tenderness or enlargement.. Digits and nails w/o clubbing, cyanosis, infection, petechiae, ischemia, or inflammatory conditions.. Integumentary (Hair, Skin) No suspicious lesions. No crepitus or fluctuance. No peri-wound warmth or erythema. No masses.Marland Kitchen Psychiatric Judgement and insight Intact.. No evidence of depression, anxiety, or agitation.. Notes the right heel medially has healthy  granulation tissue interspersed with a lot of slough and I sharply debrided this wound subcutaneous area and bleeding controlled with pressure. Electronic Signature(s) Signed: 04/05/2016 1:29:43 PM By: [name] Kanner MD, FACS Entered By: [name] on 04/05/2016 13:29:43 [name] ([id number]) -------------------------------------------------------------------------------- Physician Orders Details Patient Name: [name] Date of Service: 04/05/2016 12:45 PM Medical Record Number: [id number] Patient Account Number: [id number] Date of Birth/Sex: [birth year] (70 y.o. Male) Treating RN: [name] Primary Care Physician: [name] Other Clinician: Referring Physician: [name] Treating Physician/Extender: [name] in Treatment: 66 Verbal / Phone Orders: Yes Clinician: Ashok Cordia, Debi Read Back and Verified: Yes Diagnosis Coding Wound Cleansing Wound #1 Left Lower Leg o May shower with protection. - please cover wraps and keep dry o No tub bath. Wound #2 Right Lower Leg o May shower with protection. - please cover wraps and keep dry o No tub bath. Wound #3 Right Calcaneus o May shower with protection. - please cover wraps and keep dry o No tub bath. Wound #6 Right,Lateral Foot o May shower with protection. - please cover wraps and keep dry o No tub bath. Wound #7 Midline Coccyx o May shower with protection. - please cover wraps and keep  dry o No tub bath. Anesthetic Wound #1 Left Lower Leg o Topical Lidocaine 4% cream applied to wound bed prior to debridement Wound #2 Right Lower Leg o Topical Lidocaine 4% cream applied to wound bed prior to debridement Wound #3 Right Calcaneus o Topical Lidocaine 4% cream applied to wound bed prior to debridement Wound #6 Right,Lateral Foot o Topical Lidocaine 4% cream applied to wound bed prior to debridement Wound #7 Midline Coccyx o Topical Lidocaine 4% cream applied to wound bed prior to debridement Narciso, Baran [name]. ([id number]) Skin Barriers/Peri-Wound Care Wound #1 Left Lower Leg o Barrier cream Wound #2 Right Lower Leg o Barrier cream Wound #3 Right Calcaneus o Barrier cream Wound #6 Right,Lateral Foot o Barrier cream Wound #7 Midline Coccyx o Barrier cream Primary Wound Dressing Wound #1 Left Lower Leg o Aquacel Ag Wound #2 Right Lower Leg o Aquacel Ag Wound #3 Right Calcaneus o Santyl Ointment Wound #6 Right,Lateral Foot o Aquacel Ag Wound #7 Midline Coccyx o Aquacel Ag Secondary Dressing Wound #1 Left Lower Leg o ABD pad Wound #2 Right Lower Leg o ABD pad Wound #3 Right Calcaneus o Conform/Kerlix o Foam Wound #6 Right,Lateral Foot o Foam Wound #7 Midline Coccyx o Boardered Foam Dressing Fitzpatrick, Deagen [name]. ([id number]) Dressing Change Frequency Wound #1 Left Lower Leg o Change dressing every week - in clinic ******IF THE PT MISSES HIS APPT FOR THE WEEK NURSING AT THE FACILITY NEEDS TO CHANGE WRAPS AND DRESSINGS ON LEGS AND FOOT***** Wound #2 Right Lower Leg o Change dressing every week - in clinic ******IF THE PT MISSES HIS APPT FOR THE WEEK NURSING AT THE FACILITY NEEDS TO CHANGE WRAPS AND DRESSINGS ON LEGS AND FOOT***** Wound #3 Right Calcaneus o Change dressing every day. - to be changed daily by SNF RN Wound #6 Right,Lateral Foot o Change dressing every week - in clinic ******IF THE PT  MISSES HIS APPT FOR THE WEEK NURSING AT THE FACILITY NEEDS TO CHANGE WRAPS AND DRESSINGS ON LEGS AND FOOT***** Wound #7 Midline Coccyx o Change dressing every day. - to be changed daily by SNF RN Follow-up Appointments Wound #1 Left Lower Leg o Return Appointment in 1 week. Wound #2 Right Lower Leg o Return Appointment in 1 week. Wound #3 Right Calcaneus   o Return Appointment in 1 week. Wound #6 Right,Lateral Foot o Return Appointment in 1 week. Wound #7 Midline Coccyx o Return Appointment in 1 week. Edema Control Wound #1 Left Lower Leg o 2 Layer Lite Compression System - Bilateral o Elevate legs to the level of the heart and pump ankles as often as possible Wound #2 Right Lower Leg o 2 Layer Lite Compression System - Bilateral o Elevate legs to the level of the heart and pump ankles as often as possible - ******IF THE PT MISSES HIS APPT FOR THE WEEK NURSING AT THE FACILITY NEEDS TO CHANGE WRAPS AND DRESSINGS ON LEGS AND FOOT***** [name], [name] ([id number]) Wound #6 Right,Lateral Foot o 2 Layer Lite Compression System - Bilateral o Elevate legs to the level of the heart and pump ankles as often as possible - ******IF THE PT MISSES HIS APPT FOR THE WEEK NURSING AT THE FACILITY NEEDS TO CHANGE WRAPS AND DRESSINGS ON LEGS AND FOOT***** Off-Loading Wound #1 Left Lower Leg o Turn and reposition every 2 hours o Other: - sage boots Wound #2 Right Lower Leg o Turn and reposition every 2 hours o Other: - sage boots Wound #3 Right Calcaneus o Turn and reposition every 2 hours o Other: - sage boots Wound #6 Right,Lateral Foot o Turn and reposition every 2 hours o Other: - sage boots Wound #7 Midline Coccyx o Turn and reposition every 2 hours o Other: - sage boots Additional Orders / Instructions Wound #1 Left Lower Leg o Increase protein intake. Wound #2 Right Lower Leg o Increase protein intake. Wound #3 Right Calcaneus o  Increase protein intake. Wound #6 Right,Lateral Foot o Increase protein intake. Wound #7 Midline Coccyx o Increase protein intake. Medications-please add to medication list. Wound #1 Left Lower Leg o Other: - Vitamin C, Vitamin A, Zinc, multivitamin [name], [name] ([id number]) Wound #2 Right Lower Leg o Other: - Vitamin C, Vitamin A, Zinc, multivitamin Wound #3 Right Calcaneus o Other: - Vitamin C, Vitamin A, Zinc, multivitamin Wound #6 Right,Lateral Foot o Other: - Vitamin C, Vitamin A, Zinc, multivitamin Wound #7 Midline Coccyx o Other: - Vitamin C, Vitamin A, Zinc, multivitamin Electronic Signature(s) Signed: 04/05/2016 4:01:52 PM By: [name] Kanner MD, FACS Signed: 04/05/2016 4:37:51 PM By: [name] Entered By: [name] on 04/05/2016 13:26:25 [name] ([id number]) -------------------------------------------------------------------------------- Problem List Details Patient Name: [name] Date of Service: 04/05/2016 12:45 PM Medical Record Number: [id number] Patient Account Number: [id number] Date of Birth/Sex: 11-24-1945 (70 y.o. Male) Treating RN: [name] Primary Care Physician: [name] Other Clinician: Referring Physician: [name] Treating Physician/Extender: [name] in Treatment: 40 Active Problems ICD-10 Encounter Code Description Active Date Diagnosis E11.621 Type 2 diabetes mellitus with foot ulcer 01/01/2016 Yes L89.613 Pressure ulcer of right heel, stage 3 01/01/2016 Yes E66.01 Morbid (severe) obesity due to excess calories 01/01/2016 Yes I89.0 Lymphedema, not elsewhere classified 01/01/2016 Yes M70.871 Other soft tissue disorders related to use, overuse and 01/19/2016 Yes pressure, right ankle and foot 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/16/2016 Yes ulceration of heel and midfoot Inactive Problems Resolved Problems ICD-10 Code Description Active Date Resolved  Date L89.322 Pressure ulcer of left buttock, stage 2 01/01/2016 01/01/2016 Electronic Signature(s) Signed: 04/05/2016 1:28:21 PM By: [name] Kanner MD, FACS Entered By: [name] on 04/05/2016 13:28:21 [name] ([id number]) [name], Gertrude VMarland Kitchen ([id number]) -------------------------------------------------------------------------------- Progress Note Details Patient Name: [name] Date of Service: 04/05/2016 12:45 PM Medical Record Number: [id number]  Patient Account Number: [id number] Date of Birth/Sex: Aug 12, 1946 [age] y.o. Male) Treating RN: Ashok CordiaPinkerton, Debi Primary Care Physician: [name] MilchSMITH, [name] Other Clinician: Referring Physician: Oretha MilchSMITH, [name] Treating Physician/Extender: [name] ReBritto, Brelyn Woehl Weeks in Treatment: 13 Subjective Chief Complaint Information obtained from Patient Patient is at the clinic for treatment of an open pressure ulcer the left upper thigh and gluteal region and the right heel with bilateral swelling of the legs all of it which is going on for over a year History of Present Illness (HPI) The following HPI elements were documented for the patient's wound: Location: ulcerated area on the right heel, left gluteal region and thigh and then bilateral lower extremities Quality: Patient reports experiencing a sharp pain to affected area(s). Severity: Patient states wound are getting worse. Duration: Patient has had the wound for > 12 months prior to seeking treatment at the wound center Timing: Pain in wound is constant (hurts all the time) Context: The wound appeared gradually over time Modifying Factors: Other treatment(s) tried include: he sees his heart doctor and his primary care doctor Associated Signs and Symptoms: patient has not been able to walk for over a year now 70 year old gentleman with a known history of hypertension, diabetes, obstructive sleep apnea, COPD, diastolic CHF, coronary artery disease was admitted to the hospital with sepsis from an ulcer  of the right heel and was treated there in October 2016. he also has chronic bilateral lower extremity edema and lymphedema. He had received vancomycin, Zosyn and at that stage and x-rays showed hardware in the right ankle but no evidence of osteomyelitis. he was a former smoker. he was also treated with Augmentin orally for 10 days. He is either bedbound or wheelchair-bound and does not ambulate by himself. 01/19/2016 -- he has not been seen here for 3 weeks and this was because he was admitted to [facility] between 223 and 01/08/2016 for sepsis, UTI and pneumonia involving the left lung. he was treated with IV vancomycin and Zosyn and then meropenem. Was discharged home on oral Bactrim for 2 weeks none of his vascular test or x-rays were done and we will reorder these. 01/26/2016 -- he has not yet done the x-ray of his foot and is vascular tests are still pending. I have asked him to work on these with his nursing home staff. Addendum: he has got an x-ray of the right foot done which shows that his osteopenia but no specific ostial lysis or abnormal periosteal reaction. Final impression was degenerative changes with dorsal foot soft tissue swelling. 02/16/2016 -- lower extremity arterial duplex examination shows a 50-99% stenosis of the right tibioperoneal trunk. He had biphasic flow in the right SFA, popliteal and tibioperoneal trunk. Left-sided he had triphasic flow throughout [name], [name] ([id number]) 02/29/2016 -- he is awaiting his vascular opinion with Dr. Gilda CreaseSchnier which is to be done on April 24 03/08/2016 -- he has not kept his appointment with Dr. Gilda CreaseSchnier on April 24 and does not seem to know what happened about this. it's difficult to gauge whether he is in full control of his mental faculties as at times he is extremely rude to the nursing staff. 03/22/2016 -- the patient was seen by Dr. 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 Recommended that the patient had severe atherosclerotic changes of both lower extremities associated with ulceration and tissue loss of the foot. This is a limb threatening  ischemia and the patient was recommended to undergo angiography of the lower extremity with a hope for intervention for limb salvage. Patient agreed and will proceed to angiography. He was admitted to the hospital between May 2 and 03/15/2016 - he underwent induction of a catheter into his right lower extremity and third order catheter placement with contrast injection to the right lower extremity for distal runoff. Percutaneous transluminal angioplasty of the right superficial femoral and popliteal arteries were done. He also had a right peroneal angioplasty. Patient had a postoperative hematoma and was admitted for observation and in the next 24 hours he was very agitated and combative and had to be seen by psychiatric. He had a follow-up with [name] in 3 weeks Objective Constitutional Pulse regular. Respirations normal and unlabored. Afebrile. Vitals Time Taken: 12:54 PM, Height: 70 in, Weight: 235 lbs, BMI: 33.7, Pulse: 89 bpm, Respiratory Rate: 18 breaths/min, Blood Pressure: 114/65 mmHg. Eyes Nonicteric. Reactive to light. Ears, Nose, Mouth, and Throat Lips, teeth, and gums WNL.Marland Kitchen Moist mucosa without lesions. Neck supple and nontender. No palpable supraclavicular or cervical adenopathy. Normal sized without goiter. Respiratory WNL. No retractions.. Cardiovascular Pedal Pulses WNL. No clubbing, cyanosis or edema. Lymphatic No adneopathy. No adenopathy. No adenopathy. [name], [name] VMarland Kitchen ([id number]) Musculoskeletal Adexa without tenderness or enlargement.. Digits and nails w/o clubbing, cyanosis, infection, petechiae, ischemia, or inflammatory conditions.Marland Kitchen Psychiatric Judgement and insight Intact.. No evidence of  depression, anxiety, or agitation.. General Notes: the right heel medially has healthy granulation tissue interspersed with a lot of slough and I sharply debrided this wound subcutaneous area and bleeding controlled with pressure. Integumentary (Hair, Skin) No suspicious lesions. No crepitus or fluctuance. No peri-wound warmth or erythema. No masses.. Wound #1 status is Open. Original cause of wound was Gradually Appeared. The wound is located on the Left Lower Leg. The wound measures 5cm length x 3cm width x 0.1cm depth; 11.781cm^2 area and 1.178cm^3 volume. The wound is limited to skin breakdown. There is no tunneling or undermining noted. There is a large amount of serous drainage noted. The wound margin is flat and intact. There is large (67- 100%) pink granulation within the wound bed. There is no necrotic tissue within the wound bed. The periwound skin appearance exhibited: Localized Edema, Moist, Erythema. The surrounding wound skin color is noted with erythema which is circumferential. Periwound temperature was noted as No Abnormality. The periwound has tenderness on palpation. Wound #2 status is Open. Original cause of wound was Gradually Appeared. The wound is located on the Right Lower Leg. The wound measures 3cm length x 3cm width x 0.1cm depth; 7.069cm^2 area and 0.707cm^3 volume. The wound is limited to skin breakdown. There is no tunneling or undermining noted. There is a large amount of serous drainage noted. The wound margin is flat and intact. There is large (67- 100%) red granulation within the wound bed. There is a small (1-33%) amount of necrotic tissue within the wound bed including Adherent Slough. The periwound skin appearance exhibited: Maceration, Moist, Erythema. The periwound skin appearance did not exhibit: Localized Edema. The surrounding wound skin color is noted with erythema which is circumferential. Periwound temperature was noted as No Abnormality. The  periwound has tenderness on palpation. Wound #3 status is Open. Original cause of wound was Pressure Injury. The wound is located on the Right Calcaneus. The wound measures 1.8cm length x 2.5cm width x 0.1cm depth; 3.534cm^2 area and 0.353cm^3 volume. The wound is limited to skin breakdown. There is no tunneling or undermining  noted. There is a large amount of serous drainage noted. The wound margin is flat and intact. There is medium (34-66%) red, pink granulation within the wound bed. There is a medium (34-66%) amount of necrotic tissue within the wound bed including Adherent Slough. The periwound skin appearance exhibited: Localized Edema, Maceration, Moist, Erythema. The surrounding wound skin color is noted with erythema which is circumferential. Periwound temperature was noted as No Abnormality. The periwound has tenderness on palpation. Wound #6 status is Open. Original cause of wound was Pressure Injury. The wound is located on the Right,Lateral Foot. The wound measures 0.4cm length x 0.4cm width x 0.2cm depth; 0.126cm^2 area and 0.025cm^3 volume. The wound is limited to skin breakdown. There is no tunneling or undermining noted. There is a large amount of serosanguineous drainage noted. The wound margin is flat and intact. There is no granulation within the wound bed. There is a large (67-100%) amount of necrotic tissue within the wound bed including Eschar and Adherent Slough. The periwound skin appearance exhibited: Moist. Periwound temperature was noted as No Abnormality. The periwound has tenderness on palpation. [name], [name] ([id number]) Wound #7 status is Open. Original cause of wound was Pressure Injury. The wound is located on the Midline Coccyx. The wound measures 3cm length x 2cm width x 0.1cm depth; 4.712cm^2 area and 0.471cm^3 volume. The wound is limited to skin breakdown. There is no tunneling or undermining noted. There is a medium amount of serous drainage noted. The  wound margin is flat and intact. There is large (67-100%) red granulation within the wound bed. There is no necrotic tissue within the wound bed. The periwound skin appearance exhibited: Moist. The periwound skin appearance did not exhibit: Callus, Crepitus, Excoriation, Fluctuance, Friable, Induration, Localized Edema, Rash, Scarring, Dry/Scaly, Maceration, Atrophie Blanche, Cyanosis, Ecchymosis, Hemosiderin Staining, Mottled, Pallor, Rubor, Erythema. Periwound temperature was noted as No Abnormality. The periwound has tenderness on palpation. 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 There was a Skin/Subcutaneous Tissue Debridement ([id number]) debridement with total area of 4.5 sq cm performed by [name] Kanner, MD. with the following instrument(s): Curette to remove Viable and Non-Viable tissue/material including Exudate, Fibrin/Slough, and Subcutaneous after achieving pain control using Lidocaine 4% Topical Solution. A time out was conducted prior to the start of the procedure. A Minimum amount of bleeding was controlled with Pressure. The procedure was tolerated well with a pain level of 0 throughout and a pain level of 0 following the procedure. Post Debridement Measurements: 1.8cm length x 2.5cm width x 0.3cm depth; 1.06cm^3 volume. Post debridement Stage noted as Category/Stage III. Post procedure Diagnosis Wound #3: Same as Pre-Procedure [name], [name] ([id number]) Plan Wound Cleansing: Wound #1 Left Lower Leg: May  shower with protection. - please cover wraps and keep dry No tub bath. Wound #2 Right Lower Leg: May shower with protection. - please cover wraps and keep dry No tub bath. Wound #3 Right Calcaneus: May shower with protection. - please cover wraps and keep dry No tub bath. Wound #6 Right,Lateral Foot: May shower with protection. - please  cover wraps and keep dry No tub bath. Wound #7 Midline Coccyx: May shower with protection. - please cover wraps and keep dry No tub bath. Anesthetic: Wound #1 Left Lower Leg: Topical Lidocaine 4% cream applied to wound bed prior to debridement Wound #2 Right Lower Leg: Topical Lidocaine 4% cream applied to wound bed prior to debridement Wound #3 Right Calcaneus: Topical Lidocaine 4% cream applied to wound bed prior to debridement Wound #6 Right,Lateral Foot: Topical Lidocaine 4% cream applied to wound bed prior to debridement Wound #7 Midline Coccyx: Topical Lidocaine 4% cream applied to wound bed prior to debridement Skin Barriers/Peri-Wound Care: Wound #1 Left Lower Leg: Barrier cream Wound #2 Right Lower Leg: Barrier cream Wound #3 Right Calcaneus: Barrier cream Wound #6 Right,Lateral Foot: Barrier cream Wound #7 Midline Coccyx: Barrier cream Primary Wound Dressing: Wound #1 Left Lower Leg: Aquacel Ag Wound #2 Right Lower Leg: Aquacel Ag Wound #3 Right Calcaneus: Santyl Ointment Wound #6 Right,Lateral Foot: Aquacel Ag [name], [name] ([id number]) Wound #7 Midline Coccyx: Aquacel Ag Secondary Dressing: Wound #1 Left Lower Leg: ABD pad Wound #2 Right Lower Leg: ABD pad Wound #3 Right Calcaneus: Conform/Kerlix Foam Wound #6 Right,Lateral Foot: Foam Wound #7 Midline Coccyx: Boardered Foam Dressing Dressing Change Frequency: Wound #1 Left Lower Leg: Change dressing every week - in clinic ******IF THE PT MISSES HIS APPT FOR THE WEEK NURSING AT THE FACILITY NEEDS TO CHANGE WRAPS AND DRESSINGS ON LEGS AND FOOT***** Wound #2  Right Lower Leg: Change dressing every week - in clinic ******IF THE PT MISSES HIS APPT FOR THE WEEK NURSING AT THE FACILITY NEEDS TO CHANGE WRAPS AND DRESSINGS ON LEGS AND FOOT***** Wound #3 Right Calcaneus: Change dressing every day. - to be changed daily by SN[facility] RN Wound #6 Right,Lateral Foot: Change dressing every week - in clinic ******IF THE PT MISSES HIS APPT FOR THE WEEK NURSING AT THE FACILITY NEEDS TO CHANGE WRAPS AND DRESSINGS ON LEGS AND FOOT***** Wound #7 Midline Coccyx: Change dressing every day. - to be changed daily by SNF RN Follow-up Appointments: Wound #1 Left Lower Leg: Return Appointment in 1 week. Wound #2 Right Lower Leg: Return Appointment in 1 week. Wound #3 Right Calcaneus: Return Appointment in 1 week. Wound #6 Right,Lateral Foot: Return Appointment in 1 week. Wound #7 Midline Coccyx: Return Appointment in 1 week. Edema Control: Wound #1 Left Lower Leg: 2 Layer Lite Compression System - Bilateral Elevate legs to the level of the heart and pump ankles as often as possible Wound #2 Right Lower Leg: 2 Layer Lite Compression System - Bilateral Elevate legs to the level of the heart and pump ankles as often as possible - ******IF THE PT MISSES HIS APPT FOR THE WEEK NURSING AT THE FACILITY NEEDS TO CHANGE WRAPS AND DRESSINGS ON LEGS AND FOOT***** Wound #6 Right,Lateral Foot: 2 Layer Lite Compression System - Bilateral Elevate legs to the level of the heart and pump ankles as often as possible - ******IF THE PT MISSES HIS [name], [name] ([id number]) APPT FOR THE WEEK NURSING AT THE FACILITY NEEDS TO CHANGE WRAPS AND DRESSINGS ON LEGS AND FOOT***** Off-Loading: Wound #1 Left Lower Leg: Turn and reposition every 2 hours Other: - sage boots Wound #2 Right Lower Leg: Turn and reposition every 2 hours Other: - sage boots Wound #3 Right Calcaneus: Turn and reposition every 2 hours Other: - sage boots Wound #6 Right,Lateral Foot: Turn and reposition every  2 hours Other: - sage boots Wound #7 Midline Coccyx: Turn  and reposition every 2 hours Other: - sage boots Additional Orders / Instructions: Wound #1 Left Lower Leg: Increase protein intake. Wound #2 Right Lower Leg: Increase protein intake. Wound #3 Right Calcaneus: Increase protein intake. Wound #6 Right,Lateral Foot: Increase protein intake. Wound #7 Midline Coccyx: Increase protein intake. Medications-please add to medication list.: Wound #1 Left Lower Leg: Other: - Vitamin C, Vitamin A, Zinc, multivitamin Wound #2 Right Lower Leg: Other: - Vitamin C, Vitamin A, Zinc, multivitamin Wound #3 Right Calcaneus: Other: - Vitamin C, Vitamin A, Zinc, multivitamin Wound #6 Right,Lateral Foot: Other: - Vitamin C, Vitamin A, Zinc, multivitamin Wound #7 Midline Coccyx: Other: - Vitamin C, Vitamin A, Zinc, multivitamin I have recommended: 1. Santyl ointment locally to his right heel. The patient is very tender, but I have been able to establish a new wound on the right lateral foot. This will be treated with silver alginate [name], [name] ([id number]) 2. Constant off loading and also using Sage boot. 3. bilateral lower extremity 2 layer light compression, which he has been using before. 4. we have recommended juxta light stockings for him as he is going to be able to discontinue his compression wraps and use compression stockings 5. High-protein diet and multivitamins including vitamin C and zinc. Electronic Signature(s) Signed: 04/05/2016 1:30:34 PM By: [name] Kanner MD, FACS Entered By: [name] on 04/05/2016 13:30:33 [name] ([id number]) -------------------------------------------------------------------------------- SuperBill Details Patient Name: [name] Date of Service: 04/05/2016 Medical Record Number: [id number] Patient Account Number: [id number] Date of Birth/Sex: 30-Jul-1946 (70 y.o. 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 Facility Procedures CPT4: Description Modifier Quantity Code [id number] 11042 - DEB SUBQ TISSUE 20 SQ CM/< 1 ICD-10 Description Diagnosis E11.621 Type 2 diabetes mellitus with foot ulcer L89.613 Pressure ulcer of right heel, stage 3 I89.0 Lymphedema, not elsewhere classified  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

## 2016-04-11 ENCOUNTER — Encounter: Payer: Medicare Other | Attending: Surgery | Admitting: Surgery

## 2016-04-11 DIAGNOSIS — M109 Gout, unspecified: Secondary | ICD-10-CM | POA: Diagnosis not present

## 2016-04-11 DIAGNOSIS — J449 Chronic obstructive pulmonary disease, unspecified: Secondary | ICD-10-CM | POA: Insufficient documentation

## 2016-04-11 DIAGNOSIS — I70234 Atherosclerosis of native arteries of right leg with ulceration of heel and midfoot: Secondary | ICD-10-CM | POA: Diagnosis not present

## 2016-04-11 DIAGNOSIS — I2510 Atherosclerotic heart disease of native coronary artery without angina pectoris: Secondary | ICD-10-CM | POA: Insufficient documentation

## 2016-04-11 DIAGNOSIS — M1990 Unspecified osteoarthritis, unspecified site: Secondary | ICD-10-CM | POA: Insufficient documentation

## 2016-04-11 DIAGNOSIS — I5032 Chronic diastolic (congestive) heart failure: Secondary | ICD-10-CM | POA: Diagnosis not present

## 2016-04-11 DIAGNOSIS — G4733 Obstructive sleep apnea (adult) (pediatric): Secondary | ICD-10-CM | POA: Insufficient documentation

## 2016-04-11 DIAGNOSIS — E11621 Type 2 diabetes mellitus with foot ulcer: Secondary | ICD-10-CM | POA: Insufficient documentation

## 2016-04-11 DIAGNOSIS — I890 Lymphedema, not elsewhere classified: Secondary | ICD-10-CM | POA: Insufficient documentation

## 2016-04-11 DIAGNOSIS — L97821 Non-pressure chronic ulcer of other part of left lower leg limited to breakdown of skin: Secondary | ICD-10-CM | POA: Diagnosis not present

## 2016-04-11 DIAGNOSIS — L89613 Pressure ulcer of right heel, stage 3: Secondary | ICD-10-CM | POA: Diagnosis not present

## 2016-04-11 DIAGNOSIS — I110 Hypertensive heart disease with heart failure: Secondary | ICD-10-CM | POA: Diagnosis not present

## 2016-04-11 DIAGNOSIS — Z87891 Personal history of nicotine dependence: Secondary | ICD-10-CM | POA: Insufficient documentation

## 2016-04-11 DIAGNOSIS — Z6833 Body mass index (BMI) 33.0-33.9, adult: Secondary | ICD-10-CM | POA: Insufficient documentation

## 2016-04-11 DIAGNOSIS — E1140 Type 2 diabetes mellitus with diabetic neuropathy, unspecified: Secondary | ICD-10-CM | POA: Diagnosis not present

## 2016-04-11 DIAGNOSIS — M70871 Other soft tissue disorders related to use, overuse and pressure, right ankle and foot: Secondary | ICD-10-CM | POA: Insufficient documentation

## 2016-04-11 NOTE — Progress Notes (Addendum)
[name], [name] ([id number]) Visit Report for 04/11/2016 Chief Complaint Document Details Patient Name: [name], [name] Date of Service: 04/11/2016 8:45 AM Medical Record Number: [id number] Patient Account Number: [id number] Date of Birth/Sex: Jun 26, 1946 [age] y.o. Male) Treating RN: [name], [name] Primary Care Physician: Oretha MilchSMITH, [name] Other Clinician: Referring Physician: Oretha MilchSMITH, [name] Treating Physician/Extender: Rudene ReBritto, Terrea Bruster Weeks in Treatment: 14 Information Obtained from: Patient Chief Complaint Patient is at the clinic for treatment of an open pressure ulcer the left upper thigh and gluteal region and the right heel with bilateral swelling of the legs all of it which is going on for over a year Electronic Signature(s) Signed: 04/11/2016 9:22:52 AM By: Evlyn KannerBritto, Blain Hunsucker MD, FACS Entered By: Evlyn KannerBritto, Elma Limas on 04/11/2016 09:22:51 [name], [name] ([id number]) -------------------------------------------------------------------------------- HPI Details Patient Name: [name], [name] Date of Service: 04/11/2016 8:45 AM Medical Record Number: [id number] Patient Account Number: [id number] Date of Birth/Sex: Jun 26, 1946 [age] y.o. Male) Treating RN: [name], [name] Primary Care Physician: Oretha MilchSMITH, [name] Other Clinician: Referring Physician: Oretha MilchSMITH, [name] Treating Physician/Extender: Rudene ReBritto, Tomekia Helton Weeks in Treatment: 14 History of Present Illness Location: ulcerated area on the right heel, left gluteal region and thigh and then bilateral lower extremities Quality: Patient reports experiencing a sharp pain to affected area(s). Severity: Patient states wound are getting worse. Duration: Patient has had the wound for > 12 months prior to seeking treatment at the wound center Timing: Pain in wound is constant (hurts all the time) Context: The wound appeared gradually over time Modifying Factors: Other treatment(s) tried include: he sees his heart doctor and his primary care doctor Associated Signs and  Symptoms: patient has not been able to walk for over a year now HPI Description: 70 year old gentleman with a known history of hypertension, diabetes, obstructive sleep apnea, COPD, diastolic CHF, coronary artery disease was admitted to the hospital with sepsis from an ulcer of the right heel and was treated there in October 2016. he also has chronic bilateral lower extremity edema and lymphedema. He had received vancomycin, Zosyn and at that stage and x-rays showed hardware in the right ankle but no evidence of osteomyelitis. he was a former smoker. he was also treated with Augmentin orally for 10 days. He is either bedbound or wheelchair-bound and does not ambulate by himself. 01/19/2016 -- he has not been seen here for 3 weeks and this was because he was admitted to [facility] between 223 and 01/08/2016 for sepsis, UTI and pneumonia involving the left lung. he was treated with IV vancomycin and Zosyn and then meropenem. Was discharged home on oral Bactrim for 2 weeks none of his vascular test or x-rays were done and we will reorder these. 01/26/2016 -- he has not yet done the x-ray of his foot and is vascular tests are still pending. I have asked him to work on these with his nursing home staff. Addendum: he has got an x-ray of the right foot done which shows that his osteopenia but no specific ostial lysis or abnormal periosteal reaction. Final impression was degenerative changes with dorsal foot soft tissue swelling. 02/16/2016 -- lower extremity arterial duplex examination shows a 50-99% stenosis of the right tibioperoneal trunk. He had biphasic flow in the right SFA, popliteal and tibioperoneal trunk. Left-sided he had triphasic flow throughout 02/29/2016 -- he is awaiting his vascular opinion with Dr. Gilda CreaseSchnier which is to be done on April 24 03/08/2016 -- he has not kept his appointment with Dr. Gilda CreaseSchnier on April 24 and does not seem to know what happened  about  this. it's difficult to gauge whether he is in full control of his mental faculties as at times he is extremely rude to the nursing staff. 03/22/2016 -- the patient was seen by Dr. 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 Recommended that the patient had severe atherosclerotic changes of both lower extremities associated with ulceration and tissue loss of the foot. This is a limb threatening ischemia and the patient was recommended to undergo [name], [name] ([id number]) angiography of the lower extremity with a hope for intervention for limb salvage. Patient agreed and will proceed to angiography. He was admitted to the hospital between May 2 and 03/15/2016 - he underwent induction of a catheter into his right lower extremity and third order catheter placement with contrast injection to the right lower extremity for distal runoff. Percutaneous transluminal angioplasty of the right superficial femoral and popliteal arteries were done. He also had a right peroneal angioplasty. Patient had a postoperative hematoma and was admitted for observation and in the next 24 hours he was very agitated and combative and had to be seen by psychiatric. He had a follow-up with Dr. Gilda Crease in 3 weeks Electronic Signature(s) Signed: 04/11/2016 9:22:57 AM By: Evlyn Kanner MD, FACS Entered By: Evlyn Kanner on 04/11/2016 09:22:57 [name] ([id number]) -------------------------------------------------------------------------------- Physical Exam Details Patient Name: [name] Date of Service: 04/11/2016 8:45 AM Medical Record Number: [id number] Patient Account Number: [id number] Date of Birth/Sex: [birth year] (70 y.o. Male) Treating RN: [name] Sites Primary Care Physician: Oretha Milch Other Clinician: Referring Physician: Oretha Milch Treating Physician/Extender: Rudene Re in Treatment: 14 Constitutional . Pulse regular. Respirations normal and unlabored. Afebrile. . Eyes Nonicteric. Reactive to light. Ears, Nose, Mouth, and Throat Lips, teeth, and gums WNL.Marland Kitchen Moist mucosa without lesions. Neck supple and nontender. No palpable supraclavicular or cervical adenopathy. Normal sized without goiter. Respiratory WNL. No retractions.. Breath sounds WNL, No rubs, rales, rhonchi, or wheeze.. Cardiovascular Heart rhythm and rate regular, no murmur or gallop.. Pedal Pulses WNL. No clubbing, cyanosis or edema. Lymphatic No adneopathy. No adenopathy. No adenopathy. Musculoskeletal Adexa without tenderness or enlargement.. Digits and nails w/o clubbing, cyanosis, infection, petechiae, ischemia, or inflammatory conditions.. Integumentary (Hair, Skin) No suspicious lesions. No crepitus or fluctuance. No peri-wound warmth or erythema. No masses.Marland Kitchen Psychiatric Judgement and insight Intact.. No evidence of depression, anxiety, or agitation.. Notes the right heel is looking much cleaner today with minimal slough and no sharp debridement was required. The lateral right foot has a small open area which is clean. His lower extremities continue to have lymphedema and oozing a bit of lymph. Electronic Signature(s) Signed: 04/11/2016 9:23:59 AM By: Evlyn Kanner MD, FACS Entered By: Evlyn Kanner on 04/11/2016 09:23:59 [name] ([id number]) -------------------------------------------------------------------------------- Physician Orders Details Patient Name: [name] Date of Service: 04/11/2016 8:45 AM Medical Record Number: [id number] Patient Account Number: [id number] Date of Birth/Sex: [birth year] (70 y.o. Male) Treating RN: Phillis Haggis Primary Care Physician: Oretha Milch Other Clinician: Referring Physician: Oretha Milch Treating Physician/Extender: Rudene Re in Treatment: 65 Verbal / Phone Orders: Yes Clinician: Ashok Cordia,  Debi Read Back and Verified: Yes Diagnosis Coding Wound Cleansing Wound #1 Left Lower Leg o May shower with protection. - please cover wraps and keep dry o No tub bath. Wound #2 Right Lower Leg o May shower with protection. - please cover wraps and keep dry o No tub bath. Wound #  3 Right Calcaneus o May shower with protection. - please cover wraps and keep dry o No tub bath. Wound #6 Right,Lateral Foot o May shower with protection. - please cover wraps and keep dry o No tub bath. Wound #7 Midline Coccyx o May shower with protection. - please cover wraps and keep dry o No tub bath. Anesthetic Wound #1 Left Lower Leg o Topical Lidocaine 4% cream applied to wound bed prior to debridement - for clinic use only Wound #2 Right Lower Leg o Topical Lidocaine 4% cream applied to wound bed prior to debridement - for clinic use only Wound #3 Right Calcaneus o Topical Lidocaine 4% cream applied to wound bed prior to debridement - for clinic use only Wound #6 Right,Lateral Foot o Topical Lidocaine 4% cream applied to wound bed prior to debridement - for clinic use only Skin Barriers/Peri-Wound Care Wound #3 Right Calcaneus o Barrier cream Doughtie, Markhi [name]. ([id number]) Wound #6 Right,Lateral Foot o Barrier cream Wound #7 Midline Coccyx o Barrier cream Primary Wound Dressing Wound #1 Left Lower Leg o Aquacel Ag Wound #2 Right Lower Leg o Aquacel Ag Wound #3 Right Calcaneus o Santyl Ointment Wound #6 Right,Lateral Foot o Aquacel Ag Wound #7 Midline Coccyx o Other: - zinc oxide (desitin) Secondary Dressing Wound #1 Left Lower Leg o ABD pad Wound #2 Right Lower Leg o ABD pad Wound #3 Right Calcaneus o ABD pad o Dry Gauze o Conform/Kerlix Wound #6 Right,Lateral Foot o ABD pad Wound #7 Midline Coccyx o Boardered Foam Dressing Dressing Change Frequency Wound #1 Left Lower Leg o Change dressing every week - in  clinic ******IF THE PT MISSES HIS APPT FOR THE WEEK NURSING AT THE FACILITY NEEDS TO CHANGE WRAPS AND DRESSINGS ON LEGS AND FOOT***** Wound #2 Right Lower Leg Riedl, Severo [name]. ([id number]) o Change dressing every week - in clinic ******IF THE PT MISSES HIS APPT FOR THE WEEK NURSING AT THE FACILITY NEEDS TO CHANGE WRAPS AND DRESSINGS ON LEGS AND FOOT***** Wound #3 Right Calcaneus o Change dressing every day. - to be changed daily by SNF RN Wound #6 Right,Lateral Foot o Change dressing every week - in clinic ******IF THE PT MISSES HIS APPT FOR THE WEEK NURSING AT THE FACILITY NEEDS TO CHANGE WRAPS AND DRESSINGS ON LEGS AND FOOT***** Wound #7 Midline Coccyx o Change dressing every day. - to be changed daily by SNF RN Follow-up Appointments Wound #1 Left Lower Leg o Return Appointment in 1 week. Wound #2 Right Lower Leg o Return Appointment in 1 week. Wound #3 Right Calcaneus o Return Appointment in 1 week. Wound #6 Right,Lateral Foot o Return Appointment in 1 week. Wound #7 Midline Coccyx o Return Appointment in 1 week. Edema Control Wound #1 Left Lower Leg o 2 Layer Lite Compression System - Bilateral o Elevate legs to the level of the heart and pump ankles as often as possible Wound #2 Right Lower Leg o 2 Layer Lite Compression System - Bilateral o Elevate legs to the level of the heart and pump ankles as often as possible - ******IF THE PT MISSES HIS APPT FOR THE WEEK NURSING AT THE FACILITY NEEDS TO CHANGE WRAPS AND DRESSINGS ON LEGS AND FOOT***** Wound #6 Right,Lateral Foot o 2 Layer Lite Compression System - Bilateral o Elevate legs to the level of the heart and pump ankles as often as possible - ******IF THE PT MISSES HIS APPT FOR THE WEEK NURSING AT THE FACILITY NEEDS TO CHANGE WRAPS AND DRESSINGS ON LEGS AND  FOOT***** [name], Nash VMarland Kitchen ([id number]) Off-Loading Wound #1 Left Lower Leg o Turn and reposition every 2 hours o Other: -  sage boots Wound #2 Right Lower Leg o Turn and reposition every 2 hours o Other: - sage boots Wound #3 Right Calcaneus o Turn and reposition every 2 hours o Other: - sage boots Wound #6 Right,Lateral Foot o Turn and reposition every 2 hours o Other: - sage boots Wound #7 Midline Coccyx o Turn and reposition every 2 hours o Other: - sage boots Additional Orders / Instructions Wound #1 Left Lower Leg o Increase protein intake. Wound #2 Right Lower Leg o Increase protein intake. Wound #3 Right Calcaneus o Increase protein intake. Wound #6 Right,Lateral Foot o Increase protein intake. Wound #7 Midline Coccyx o Increase protein intake. Medications-please add to medication list. Wound #1 Left Lower Leg o Other: - Vitamin C, Vitamin A, Zinc, multivitamin Wound #2 Right Lower Leg o Other: - Vitamin C, Vitamin A, Zinc, multivitamin Wound #3 Right Calcaneus o Other: - Vitamin C, Vitamin A, Zinc, multivitamin Wound #6 Right,Lateral Foot o Other: - Vitamin C, Vitamin A, Zinc, multivitamin [name], [name] ([id number]) Wound #7 Midline Coccyx o Other: - Vitamin C, Vitamin A, Zinc, multivitamin Electronic Signature(s) Signed: 04/11/2016 4:11:18 PM By: Evlyn Kanner MD, FACS Signed: 04/11/2016 5:18:04 PM By: Alejandro Mulling Entered By: Alejandro Mulling on 04/11/2016 09:29:31 [name] ([id number]) -------------------------------------------------------------------------------- Problem List Details Patient Name: [name] Date of Service: 04/11/2016 8:45 AM Medical Record Number: [id number] Patient Account Number: [id number] Date of Birth/Sex: 05/29/1946 (70 y.o. Male) Treating RN: [name] Sites Primary Care Physician: Oretha Milch Other Clinician: Referring Physician: Oretha Milch Treating Physician/Extender: Rudene Re in Treatment: 14 Active Problems ICD-10 Encounter Code Description Active Date Diagnosis E11.621 Type 2  diabetes mellitus with foot ulcer 01/01/2016 Yes L89.613 Pressure ulcer of right heel, stage 3 01/01/2016 Yes E66.01 Morbid (severe) obesity due to excess calories 01/01/2016 Yes I89.0 Lymphedema, not elsewhere classified 01/01/2016 Yes M70.871 Other soft tissue disorders related to use, overuse and 01/19/2016 Yes pressure, right ankle and foot 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/16/2016 Yes ulceration of heel and midfoot Inactive Problems Resolved Problems ICD-10 Code Description Active Date Resolved Date L89.322 Pressure ulcer of left buttock, stage 2 01/01/2016 01/01/2016 Electronic Signature(s) Signed: 04/11/2016 9:22:42 AM By: Evlyn Kanner MD, FACS Entered By: Evlyn Kanner on 04/11/2016 09:22:42 [name] ([id number]) [name], [name] VMarland Kitchen ([id number]) -------------------------------------------------------------------------------- Progress Note Details Patient Name: [name] Date of Service: 04/11/2016 8:45 AM Medical Record Number: [id number] Patient Account Number: [id number] Date of Birth/Sex: 1946-06-15 (70 y.o. Male) Treating RN: [name] Sites Primary Care Physician: Oretha Milch Other Clinician: Referring Physician: Oretha Milch Treating Physician/Extender: Rudene Re in Treatment: 14 Subjective Chief Complaint Information obtained from Patient Patient is at the clinic for treatment of an open pressure ulcer the left upper thigh and gluteal region and the right heel with bilateral swelling of the legs all of it which is going on for over a year History of Present Illness (HPI) The following HPI elements were documented for the patient's wound: Location: ulcerated area on the right heel, left gluteal region and thigh and then bilateral lower extremities Quality: Patient reports experiencing a sharp pain to affected area(s). Severity: Patient states wound are getting worse. Duration: Patient has had the wound for > 12 months prior to  seeking treatment at the wound center Timing: Pain in wound is constant (hurts all the time) Context: The  wound appeared gradually over time Modifying Factors: Other treatment(s) tried include: he sees his heart doctor and his primary care doctor Associated Signs and Symptoms: patient has not been able to walk for over a year now 70 year old gentleman with a known history of hypertension, diabetes, obstructive sleep apnea, COPD, diastolic CHF, coronary artery disease was admitted to the hospital with sepsis from an ulcer of the right heel and was treated there in October 2016. he also has chronic bilateral lower extremity edema and lymphedema. He had received vancomycin, Zosyn and at that stage and x-rays showed hardware in the right ankle but no evidence of osteomyelitis. he was a former smoker. he was also treated with Augmentin orally for 10 days. He is either bedbound or wheelchair-bound and does not ambulate by himself. 01/19/2016 -- he has not been seen here for 3 weeks and this was because he was admitted to [facility] between 223 and 01/08/2016 for sepsis, UTI and pneumonia involving the left lung. he was treated with IV vancomycin and Zosyn and then meropenem. Was discharged home on oral Bactrim for 2 weeks none of his vascular test or x-rays were done and we will reorder these. 01/26/2016 -- he has not yet done the x-ray of his foot and is vascular tests are still pending. I have asked him to work on these with his nursing home staff. Addendum: he has got an x-ray of the right foot done which shows that his osteopenia but no specific ostial lysis or abnormal periosteal reaction. Final impression was degenerative changes with dorsal foot soft tissue swelling. 02/16/2016 -- lower extremity arterial duplex examination shows a 50-99% stenosis of the right tibioperoneal trunk. He had biphasic flow in the right SFA, popliteal and tibioperoneal trunk. Left-sided  he had triphasic flow throughout JAMONTA, GOERNER ([id number]) 02/29/2016 -- he is awaiting his vascular opinion with Dr. Gilda Crease which is to be done on April 24 03/08/2016 -- he has not kept his appointment with Dr. Gilda Crease on April 24 and does not seem to know what happened about this. it's difficult to gauge whether he is in full control of his mental faculties as at times he is extremely rude to the nursing staff. 03/22/2016 -- the patient was seen by Dr. 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 Recommended that the patient had severe atherosclerotic changes of both lower extremities associated with ulceration and tissue loss of the foot. This is a limb threatening ischemia and the patient was recommended to undergo angiography of the lower extremity with a hope for intervention for limb salvage. Patient agreed and will proceed to angiography. He was admitted to the hospital between May 2 and 03/15/2016 - he underwent induction of a catheter into his right lower extremity and third order catheter placement with contrast injection to the right lower extremity for distal runoff. Percutaneous transluminal angioplasty of the right superficial femoral and popliteal arteries were done. He also had a right peroneal angioplasty. Patient had a postoperative hematoma and was admitted for observation and in the next 24 hours he was very agitated and combative and had to be seen by psychiatric. He had a follow-up with Dr. Gilda Crease in 3 weeks Objective Constitutional Pulse regular. Respirations normal and unlabored. Afebrile. Vitals Time Taken: 8:50 AM, Height: 70 in, Weight: 235 lbs, BMI: 33.7, Temperature: 98.4 F, Pulse: 88 bpm, Respiratory Rate: 18 breaths/min, Blood Pressure: 122/73  mmHg. Eyes Nonicteric. Reactive to light. Ears, Nose, Mouth, and Throat Lips, teeth, and gums  WNL.Marland Kitchen Moist mucosa without lesions. Neck supple and nontender. No palpable supraclavicular or cervical adenopathy. Normal sized without goiter. Respiratory WNL. No retractions.. Breath sounds WNL, No rubs, rales, rhonchi, or wheeze.. Cardiovascular Heart rhythm and rate regular, no murmur or gallop.. Pedal Pulses WNL. No clubbing, cyanosis or edema. Lymphatic No adneopathy. No adenopathy. No adenopathy. [name], [name] VMarland Kitchen ([id number]) Musculoskeletal Adexa without tenderness or enlargement.. Digits and nails w/o clubbing, cyanosis, infection, petechiae, ischemia, or inflammatory conditions.Marland Kitchen Psychiatric Judgement and insight Intact.. No evidence of depression, anxiety, or agitation.. General Notes: the right heel is looking much cleaner today with minimal slough and no sharp debridement was required. The lateral right foot has a small open area which is clean. His lower extremities continue to have lymphedema and oozing a bit of lymph. Integumentary (Hair, Skin) No suspicious lesions. No crepitus or fluctuance. No peri-wound warmth or erythema. No masses.. Wound #1 status is Open. Original cause of wound was Gradually Appeared. The wound is located on the Left Lower Leg. The wound measures 5cm length x 3cm width x 0.1cm depth; 11.781cm^2 area and 1.178cm^3 volume. The wound is limited to skin breakdown. There is no tunneling or undermining noted. There is a large amount of serous drainage noted. The wound margin is flat and intact. There is large (67- 100%) pink granulation within the wound bed. There is no necrotic tissue within the wound bed. The periwound skin appearance exhibited: Localized Edema, Moist, Erythema. The surrounding wound skin color is noted with erythema which is circumferential. Periwound temperature was noted as No Abnormality. The periwound has tenderness on palpation. Wound #2 status is Open. Original cause of wound was Gradually Appeared. The wound is located on  the Right Lower Leg. The wound measures 0.3cm length x 0.3cm width x 0.1cm depth; 0.071cm^2 area and 0.007cm^3 volume. The wound is limited to skin breakdown. There is no tunneling or undermining noted. There is a large amount of serous drainage noted. The wound margin is flat and intact. There is large (67- 100%) red granulation within the wound bed. There is a small (1-33%) amount of necrotic tissue within the wound bed including Adherent Slough. The periwound skin appearance exhibited: Maceration, Moist, Erythema. The periwound skin appearance did not exhibit: Localized Edema. The surrounding wound skin color is noted with erythema which is circumferential. Periwound temperature was noted as No Abnormality. The periwound has tenderness on palpation. Wound #3 status is Open. Original cause of wound was Pressure Injury. The wound is located on the Right Calcaneus. The wound measures 1.5cm length x 3cm width x 0.2cm depth; 3.534cm^2 area and 0.707cm^3 volume. The wound is limited to skin breakdown. There is no tunneling or undermining noted. There is a large amount of serous drainage noted. The wound margin is flat and intact. There is medium (34-66%) red, pink granulation within the wound bed. There is a medium (34-66%) amount of necrotic tissue within the wound bed including Adherent Slough. The periwound skin appearance exhibited: Localized Edema, Maceration, Moist, Erythema. The surrounding wound skin color is noted with erythema which is circumferential. Periwound temperature was noted as No Abnormality. The periwound has tenderness on palpation. Wound #6 status is Open. Original cause of wound was Pressure Injury. The wound is located on the Right,Lateral Foot. The wound measures 0.3cm length x 0.3cm width x 0.2cm depth; 0.071cm^2 area and 0.014cm^3 volume. The wound is limited to skin breakdown. There is no  tunneling or undermining noted. There is a large amount of serosanguineous  drainage noted. The wound margin is flat and intact. There is no granulation within the wound bed. There is a large (67-100%) amount of necrotic tissue within the wound bed including Eschar and Adherent Slough. The periwound skin appearance exhibited: Moist. Periwound temperature was noted as No Abnormality. The periwound has tenderness on palpation. [name], [name] ([id number]) Wound #7 status is Open. Original cause of wound was Pressure Injury. The wound is located on the Midline Coccyx. The wound measures 0.1cm length x 0.1cm width x 0.1cm depth; 0.008cm^2 area and 0.001cm^3 volume. The wound is limited to skin breakdown. There is no tunneling or undermining noted. There is a small amount of serous drainage noted. The wound margin is flat and intact. There is large (67- 100%) red granulation within the wound bed. There is no necrotic tissue within the wound bed. The periwound skin appearance exhibited: Moist. The periwound skin appearance did not exhibit: Callus, Crepitus, Excoriation, Fluctuance, Friable, Induration, Localized Edema, Rash, Scarring, Dry/Scaly, Maceration, Atrophie Blanche, Cyanosis, Ecchymosis, Hemosiderin Staining, Mottled, Pallor, Rubor, Erythema. Periwound temperature was noted as No Abnormality. The periwound has tenderness on palpation. 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 - please cover wraps and keep dry No tub bath. Wound #2 Right Lower Leg: May shower with protection. - please cover wraps and keep  dry No tub bath. Wound #3 Right Calcaneus: May shower with protection. - please cover wraps and keep dry No tub bath. Wound #6 Right,Lateral Foot: May shower with protection. - please cover wraps and keep dry No tub bath. Wound #7 Midline Coccyx: May shower with protection. - please cover wraps and keep dry No tub bath. [name], [name] ([id number]) Anesthetic: Wound #1 Left Lower Leg: Topical Lidocaine 4% cream applied to wound bed prior to debridement - for clinic use only Wound #2 Right Lower Leg: Topical Lidocaine 4% cream applied to wound bed prior to debridement - for clinic use only Wound #3 Right Calcaneus: Topical Lidocaine 4% cream applied to wound bed prior to debridement - for clinic use only Wound #6 Right,Lateral Foot: Topical Lidocaine 4% cream applied to wound bed prior to debridement - for clinic use only Skin Barriers/Peri-Wound Care: Wound #3 Right Calcaneus: Barrier cream Wound #6 Right,Lateral Foot: Barrier cream Wound #7 Midline Coccyx: Barrier cream Primary Wound Dressing: Wound #1 Left Lower Leg: Aquacel Ag Wound #2 Right Lower Leg: Aquacel Ag Wound #3 Right Calcaneus: Santyl Ointment Wound #6 Right,Lateral Foot: Aquacel Ag Wound #7 Midline Coccyx: Other: - zinc oxide (desitin) Secondary Dressing: Wound #1 Left Lower Leg: ABD pad Wound #2 Right Lower Leg: ABD pad Wound #3 Right Calcaneus: ABD pad Dry Gauze Conform/Kerlix Wound #6 Right,Lateral Foot: ABD pad Wound #7 Midline Coccyx: Boardered Foam Dressing Dressing Change Frequency: Wound #1 Left Lower Leg: Change dressing every week - in clinic ******IF THE PT MISSES HIS APPT FOR THE WEEK NURSING AT THE FACILITY NEEDS TO CHANGE WRAPS AND DRESSINGS ON LEGS AND FOOT***** Wound #2 Right Lower Leg: Change dressing every week -  in clinic ******IF THE PT MISSES HIS APPT FOR THE WEEK NURSING AT THE FACILITY NEEDS TO CHANGE WRAPS AND DRESSINGS ON LEGS AND FOOT***** Wound #3 Right  Calcaneus: Change dressing every day. - to be changed daily by SN[facility] RN Wound #6 Right,Lateral Foot: Change dressing every week - in clinic ******IF THE PT MISSES HIS APPT FOR THE WEEK NURSING [name], [name] ([id number]) AT THE FACILITY NEEDS TO CHANGE WRAPS AND DRESSINGS ON LEGS AND FOOT***** Wound #7 Midline Coccyx: Change dressing every day. - to be changed daily by SNF RN Follow-up Appointments: Wound #1 Left Lower Leg: Return Appointment in 1 week. Wound #2 Right Lower Leg: Return Appointment in 1 week. Wound #3 Right Calcaneus: Return Appointment in 1 week. Wound #6 Right,Lateral Foot: Return Appointment in 1 week. Wound #7 Midline Coccyx: Return Appointment in 1 week. Edema Control: Wound #1 Left Lower Leg: 2 Layer Lite Compression System - Bilateral Elevate legs to the level of the heart and pump ankles as often as possible Wound #2 Right Lower Leg: 2 Layer Lite Compression System - Bilateral Elevate legs to the level of the heart and pump ankles as often as possible - ******IF THE PT MISSES HIS APPT FOR THE WEEK NURSING AT THE FACILITY NEEDS TO CHANGE WRAPS AND DRESSINGS ON LEGS AND FOOT***** Wound #6 Right,Lateral Foot: 2 Layer Lite Compression System - Bilateral Elevate legs to the level of the heart and pump ankles as often as possible - ******IF THE PT MISSES HIS APPT FOR THE WEEK NURSING AT THE FACILITY NEEDS TO CHANGE WRAPS AND DRESSINGS ON LEGS AND FOOT***** Off-Loading: Wound #1 Left Lower Leg: Turn and reposition every 2 hours Other: - sage boots Wound #2 Right Lower Leg: Turn and reposition every 2 hours Other: - sage boots Wound #3 Right Calcaneus: Turn and reposition every 2 hours Other: - sage boots Wound #6 Right,Lateral Foot: Turn and reposition every 2 hours Other: - sage boots Wound #7 Midline Coccyx: Turn and reposition every 2 hours Other: - sage boots Additional Orders / Instructions: Wound #1 Left Lower Leg: Increase protein  intake. Wound #2 Right Lower Leg: Increase protein intake. Wound #3 Right Calcaneus: Increase protein intake. [name], [name] ([id number]) Wound #6 Right,Lateral Foot: Increase protein intake. Wound #7 Midline Coccyx: Increase protein intake. Medications-please add to medication list.: Wound #1 Left Lower Leg: Other: - Vitamin C, Vitamin A, Zinc, multivitamin Wound #2 Right Lower Leg: Other: - Vitamin C, Vitamin A, Zinc, multivitamin Wound #3 Right Calcaneus: Other: - Vitamin C, Vitamin A, Zinc, multivitamin Wound #6 Right,Lateral Foot: Other: - Vitamin C, Vitamin A, Zinc, multivitamin Wound #7 Midline Coccyx: Other: - Vitamin C, Vitamin A, Zinc, multivitamin I have recommended: 1. Santyl ointment locally to his right heel. The patient is very tender, but I have been able to establish a new wound on the right lateral foot. This will be treated with silver alginate 2. Constant off loading and also using Sage boot. 3. bilateral lower extremity 2 layer light compression, which he has been using before. 4. we have recommended juxta light stockings for him as he is going to be able to discontinue his compression wraps and use compression stockings 5. High-protein diet and multivitamins including vitamin C and zinc. 6. he also has a bit of excoriation on his coccyx and we have asked this area to be protected with a bordered foam. Electronic Signature(s) Signed: 04/11/2016 4:12:39 PM By: Evlyn Kanner MD, FACS Previous Signature: 04/11/2016 4:12:28 PM Version By: Meyer Russel  Glory Buff MD, FACS Previous Signature: 04/11/2016 9:24:43 AM Version By: Evlyn Kanner MD, FACS Entered By: Evlyn Kanner on 04/11/2016 16:12:39 [name] ([id number]) -------------------------------------------------------------------------------- SuperBill Details Patient Name: [name] Date of Service: 04/11/2016 Medical Record Number: [id number] Patient Account Number: [id number] Date of Birth/Sex:  1946/06/19 (70 y.o. 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 Facility Procedures CPT4 Code: [id number] Description: 95284 - WOUND CARE VISIT-LEV 5 EST PT Modifier: Quantity: 1 Physician Procedures CPT4 Code: [id number] Description: 99213 - WC PHYS LEVEL 3 - EST PT ICD-10 Description Diagnosis E11.621 Type 2 diabetes mellitus with foot ulcer L89.613 Pressure ulcer of right heel, stage 3 E66.01 Morbid (severe) obesity due to excess calories I89.0 Lymphedema, not  elsewhere classified Modifier: Quantity: 1 Electronic Signature(s) Signed: 04/11/2016 4:11:18 PM By: Evlyn Kanner MD, FACS Signed: 04/11/2016 5:18:04 PM By: Alejandro Mulling Previous Signature: 04/11/2016 9:25:08 AM Version By: Evlyn Kanner MD, FACS Entered By: Alejandro Mulling on 04/11/2016 10:05:35

## 2016-04-12 NOTE — Progress Notes (Signed)
Uber, Leonel V. ([id number]) Visit Report for 04/11/2016 Arrival Information Details Patient Name: [name], [name] Date of Service: 04/11/2016 8:45 AM Medical Record Number: [id number] Patient Account Number: [id number] Date of Birth/Sex: 03-27-1946 [age] y.o. Male) Treating RN: Phillis HaggisPinkerton, Debi Primary Care Physician: Oretha MilchSMITH, [name] Other Clinician: Referring Physician: Oretha MilchSMITH, [name] Treating Physician/Extender: Rudene ReBritto, Errol Weeks in Treatment: 14Ginette [name] Visit Information History Since Last Visit All ordered tests and consults were completed: No Patient Arrived: Wheel Chair Added or deleted any medications: No Arrival Time: 08:45 Any new allergies or adverse reactions: No Accompanied By: self Had a fall or experienced change in No Transfer Assistance: EasyPivot activities of daily living that may affect Patient Lift risk of falls: Patient Identification Verified: Yes Signs or symptoms of abuse/neglect since last No Secondary Verification Process Yes visito Completed: Hospitalized since last visit: No Patient Requires Transmission- No Pain Present Now: No Based Precautions: Patient Has Alerts: Yes Patient Alerts: DM II Electronic Signature(s) Signed: 04/11/2016 5:18:04 PM By: Alejandro MullingPinkerton, Debra Entered By: Alejandro MullingPinkerton, Debra on 04/11/2016 08:48:23 [name], [name] ([id number]) -------------------------------------------------------------------------------- Clinic Level of Care Assessment Details Patient Name: [name], [name] Date of Service: 04/11/2016 8:45 AM Medical Record Number: [id number] Patient Account Number: [id number] Date of Birth/Sex: 03-27-1946 [age] y.o. Male) Treating RN: Phillis HaggisPinkerton, Debi Primary Care Physician: Oretha MilchSMITH, [name] Other Clinician: Referring Physician: Oretha MilchSMITH, [name] Treating Physician/Extender: Rudene ReBritto, Errol Weeks in Treatment: 14 Clinic Level of Care Assessment Items TOOL 4 Quantity Score X - Use when only an EandM is performed on FOLLOW-UP visit 1 0 ASSESSMENTS -  Nursing Assessment / Reassessment X - Reassessment of Co-morbidities (includes updates in patient status) 1 10 X - Reassessment of Adherence to Treatment Plan 1 5 ASSESSMENTS - Wound and Skin Assessment / Reassessment []  - Simple Wound Assessment / Reassessment - one wound 0 X - Complex Wound Assessment / Reassessment - multiple wounds 5 5 []  - Dermatologic / Skin Assessment (not related to wound area) 0 ASSESSMENTS - Focused Assessment X - Circumferential Edema Measurements - multi extremities 2 5 []  - Nutritional Assessment / Counseling / Intervention 0 []  - Lower Extremity Assessment (monofilament, tuning fork, pulses) 0 []  - Peripheral Arterial Disease Assessment (using hand held doppler) 0 ASSESSMENTS - Ostomy and/or Continence Assessment and Care []  - Incontinence Assessment and Management 0 []  - Ostomy Care Assessment and Management (repouching, etc.) 0 PROCESS - Coordination of Care []  - Simple Patient / Family Education for ongoing care 0 X - Complex (extensive) Patient / Family Education for ongoing care 1 20 []  - Staff obtains ChiropractorConsents, Records, Test Results / Process Orders 0 X - Staff telephones HHA, Nursing Homes / Clarify orders / etc 1 10 []  - Routine Transfer to another Facility (non-emergent condition) 0 Whyte, Gal V. ([id number]) []  - Routine Hospital Admission (non-emergent condition) 0 []  - New Admissions / Manufacturing engineernsurance Authorizations / Ordering NPWT, Apligraf, etc. 0 []  - Emergency Hospital Admission (emergent condition) 0 X - Simple Discharge Coordination 1 10 []  - Complex (extensive) Discharge Coordination 0 PROCESS - Special Needs []  - Pediatric / Minor Patient Management 0 []  - Isolation Patient Management 0 []  - Hearing / Language / Visual special needs 0 []  - Assessment of Community assistance (transportation, [name]/C planning, etc.) 0 []  - Additional assistance / Altered mentation 0 []  - Support Surface(s) Assessment (bed, cushion, seat, etc.)  0 INTERVENTIONS - Wound Cleansing / Measurement []  - Simple Wound Cleansing - one wound 0 X - Complex Wound Cleansing - multiple wounds 5 5 []  - Wound  Imaging (photographs - any number of wounds) 0 []  - Wound Tracing (instead of photographs) 0 []  - Simple Wound Measurement - one wound 0 X - Complex Wound Measurement - multiple wounds 5 5 INTERVENTIONS - Wound Dressings []  - Small Wound Dressing one or multiple wounds 0 X - Medium Wound Dressing one or multiple wounds 1 15 X - Large Wound Dressing one or multiple wounds 2 20 X - Application of Medications - topical 1 5 []  - Application of Medications - injection 0 INTERVENTIONS - Miscellaneous []  - External ear exam 0 Tango, Jodie V. ([id number]) []  - Specimen Collection (cultures, biopsies, blood, body fluids, etc.) 0 []  - Specimen(s) / Culture(s) sent or taken to Lab for analysis 0 []  - Patient Transfer (multiple staff / Michiel Sites Lift / Similar devices) 0 []  - Simple Staple / Suture removal (25 or less) 0 []  - Complex Staple / Suture removal (26 or more) 0 []  - Hypo / Hyperglycemic Management (close monitor of Blood Glucose) 0 []  - Ankle / Brachial Index (ABI) - do not check if billed separately 0 X - Vital Signs 1 5 Has the patient been seen at the hospital within the last three years: Yes Total Score: 205 Level Of Care: New/Established - Level 5 Electronic Signature(s) Signed: 04/11/2016 5:18:04 PM By: Alejandro Mulling Entered By: Alejandro Mulling on 04/11/2016 10:05:24 [name] ([id number]) -------------------------------------------------------------------------------- Encounter Discharge Information Details Patient Name: [name] Date of Service: 04/11/2016 8:45 AM Medical Record Number: [id number] Patient Account Number: [id number] Date of Birth/Sex: 10-26-1946 (70 y.o. Male) Treating RN: Phillis Haggis Primary Care Physician: Oretha Milch Other Clinician: Referring Physician: Oretha Milch Treating  Physician/Extender: Rudene Re in Treatment: 52 Encounter Discharge Information Items Discharge Pain Level: 0 Discharge Condition: Stable Ambulatory Status: Wheelchair Discharge Destination: Nursing Home Transportation: Other Accompanied By: self Schedule Follow-up Appointment: Yes Medication Reconciliation completed Yes and provided to Patient/Care Blandon Offerdahl: Provided on Clinical Summary of Care: 04/11/2016 Form Type Recipient Paper Patient EL Electronic Signature(s) Signed: 04/11/2016 9:49:14 AM By: Gwenlyn Perking Entered By: Gwenlyn Perking on 04/11/2016 09:49:14 Dantes, Aison Seth Bake ([id number]) -------------------------------------------------------------------------------- Lower Extremity Assessment Details Patient Name: [name] Date of Service: 04/11/2016 8:45 AM Medical Record Number: [id number] Patient Account Number: [id number] Date of Birth/Sex: [birth year] (70 y.o. Male) Treating RN: Phillis Haggis Primary Care Physician: Oretha Milch Other Clinician: Referring Physician: Oretha Milch Treating Physician/Extender: Rudene Re in Treatment: 14 Edema Assessment Assessed: [Left: No] [Right: No] E[Left: dema] [Right: :] Calf Left: Right: Point of Measurement: 34 cm From Medial Instep 34 cm 33.3 cm Ankle Left: Right: Point of Measurement: 12 cm From Medial Instep 25 cm 23.5 cm Vascular Assessment Pulses: Posterior Tibial Dorsalis Pedis Palpable: [Left:Yes] [Right:Yes] Extremity colors, hair growth, and conditions: Extremity Color: [Left:Hyperpigmented] [Right:Hyperpigmented] Temperature of Extremity: [Left:Warm] [Right:Warm] Capillary Refill: [Left:< 3 seconds] [Right:< 3 seconds] Toe Nail Assessment Left: Right: Thick: Yes Yes Discolored: Yes Yes Deformed: Yes Yes Improper Length and Hygiene: No No Electronic Signature(s) Signed: 04/11/2016 5:18:04 PM By: Alejandro Mulling Entered By: Alejandro Mulling on 04/11/2016 09:07:29 [name]  ([id number]) -------------------------------------------------------------------------------- Multi Wound Chart Details Patient Name: [name] Date of Service: 04/11/2016 8:45 AM Medical Record Number: [id number] Patient Account Number: [id number] Date of Birth/Sex: 19-Dec-1945 (70 y.o. Male) Treating RN: Phillis Haggis Primary Care Physician: Oretha Milch Other Clinician: Referring Physician: Oretha Milch Treating Physician/Extender: Rudene Re in Treatment: 14 Vital Signs Height(in): 70 Pulse(bpm): 88 Weight(lbs): 235 Blood Pressure 122/73 (mmHg): Body Mass Index(BMI): 34  Temperature(F): 98.4 Respiratory Rate 18 (breaths/min): Photos: [1:No Photos] [2:No Photos] [3:No Photos] Wound Location: [1:Left Lower Leg] [2:Right Lower Leg] [3:Right Calcaneus] Wounding Event: [1:Gradually Appeared] [2:Gradually Appeared] [3:Pressure Injury] Primary Etiology: [1:Venous Leg Ulcer] [2:Venous Leg Ulcer] [3:Pressure Ulcer] Comorbid History: [1:Cataracts, Chronic Obstructive Pulmonary Disease (COPD), Sleep Disease (COPD), Sleep Disease (COPD), Sleep Apnea, Angina, Congestive Heart Failure, Congestive Heart Failure, Congestive Heart Failure, Hypertension, Type II Diabetes,  Gout, Osteoarthritis, Neuropathy Osteoarthritis, Neuropathy Osteoarthritis, Neuropathy] [2:Cataracts, Chronic Obstructive Pulmonary Apnea, Angina, Hypertension, Type II Diabetes, Gout,] [3:Cataracts, Chronic Obstructive Pulmonary Apnea, Angina,  Hypertension, Type II Diabetes, Gout,] Date Acquired: [1:12/31/2014] [2:12/31/2014] [3:10/31/2015] Weeks of Treatment: [1:14] [2:14] [3:14] Wound Status: [1:Open] [2:Open] [3:Open] Measurements L x W x [name] 5x3x0.1 [2:0.3x0.3x0.1] [3:1.5x3x0.2] (cm) Area (cm) : [1:11.781] [2:0.071] [3:3.534] Volume (cm) : [1:1.178] [2:0.007] [3:0.707] % Reduction in Area: [1:51.90%] [2:69.90%] [3:50.00%] % Reduction in Volume: 51.90% [2:70.80%] [3:50.00%] Classification: [1:Partial  Thickness] [2:Partial Thickness] [3:Category/Stage III] HBO Classification: [1:Grade 1] [2:Grade 1] [3:Grade 1] Exudate Amount: [1:Large] [2:Large] [3:Large] Exudate Type: [1:Serous] [2:Serous] [3:Serous] Exudate Color: [1:amber] [2:amber] [3:amber] Wound Margin: [1:Flat and Intact] [2:Flat and Intact] [3:Flat and Intact] Granulation Amount: [1:Large (67-100%)] [2:Large (67-100%)] [3:Medium (34-66%)] Granulation Quality: [1:Pink] [2:Red] [3:Red, Pink] Necrotic Amount: [1:None Present (0%)] [2:Small (1-33%)] [3:Medium (34-66%)] Necrotic Tissue: N/A Adherent Slough Adherent Slough Exposed Structures: Fascia: No Fascia: No Fascia: No Fat: No Fat: No Fat: No Tendon: No Tendon: No Tendon: No Muscle: No Muscle: No Muscle: No Joint: No Joint: No Joint: No Bone: No Bone: No Bone: No Limited to Skin Limited to Skin Limited to Skin Breakdown Breakdown Breakdown Epithelialization: Large (67-100%) Large (67-100%) None Periwound Skin Texture: Edema: Yes Edema: No Edema: Yes Periwound Skin Moist: Yes Maceration: Yes Maceration: Yes Moisture: Moist: Yes Moist: Yes Periwound Skin Color: Erythema: Yes Erythema: Yes Erythema: Yes Erythema Location: Circumferential Circumferential Circumferential Temperature: No Abnormality No Abnormality No Abnormality Tenderness on Yes Yes Yes Palpation: Wound Preparation: Ulcer Cleansing: Other: Ulcer Cleansing: Other: Ulcer Cleansing: soap and water soap and water Rinsed/Irrigated with Saline Topical Anesthetic Topical Anesthetic Applied: None Applied: None Topical Anesthetic Applied: Other: lidocaine 4% Wound Number: 6 7 N/A Photos: No Photos No Photos N/A Wound Location: Right Foot - Lateral Coccyx - Midline N/A Wounding Event: Pressure Injury Pressure Injury N/A Primary Etiology: Pressure Ulcer Pressure Ulcer N/A Comorbid History: Cataracts, Chronic Cataracts, Chronic N/A Obstructive Pulmonary Obstructive Pulmonary Disease (COPD),  Sleep Disease (COPD), Sleep Apnea, Angina, Apnea, Angina, Congestive Heart Failure, Congestive Heart Failure, Hypertension, Type II Hypertension, Type II Diabetes, Gout, Diabetes, Gout, Osteoarthritis, Neuropathy Osteoarthritis, Neuropathy Date Acquired: 02/16/2016 02/23/2016 N/A Weeks of Treatment: 7 6 N/A Wound Status: Open Open N/A Measurements L x W x [name] 0.3x0.3x0.2 0.1x0.1x0.1 N/A (cm) Area (cm) : 0.071 0.008 N/A Volume (cm) : 0.014 0.001 N/A % Reduction in Area: 54.80% 100.00% N/A % Reduction in Volume: 12.50% 100.00% N/A Classification: Category/Stage II Category/Stage II N/A HBO Classification: Grade 1 N/A N/A South, Laiden V. ([id number]) Exudate Amount: Large Small N/A Exudate Type: Serosanguineous Serous N/A Exudate Color: red, brown amber N/A Wound Margin: Flat and Intact Flat and Intact N/A Granulation Amount: None Present (0%) Large (67-100%) N/A Granulation Quality: N/A Red N/A Necrotic Amount: Large (67-100%) None Present (0%) N/A Necrotic Tissue: Eschar, Adherent Slough N/A N/A Exposed Structures: Fascia: No Fascia: No N/A Fat: No Fat: No Tendon: No Tendon: No Muscle: No Muscle: No Joint: No Joint: No Bone: No Bone: No Limited to Skin Limited to Skin Breakdown Breakdown Epithelialization: None None  N/A Periwound Skin Texture: No Abnormalities Noted Edema: No N/A Excoriation: No Induration: No Callus: No Crepitus: No Fluctuance: No Friable: No Rash: No Scarring: No Periwound Skin Moist: Yes Moist: Yes N/A Moisture: Maceration: No Dry/Scaly: No Periwound Skin Color: No Abnormalities Noted Atrophie Blanche: No N/A Cyanosis: No Ecchymosis: No Erythema: No Hemosiderin Staining: No Mottled: No Pallor: No Rubor: No Erythema Location: N/A N/A N/A Temperature: No Abnormality No Abnormality N/A Tenderness on Yes Yes N/A Palpation: Wound Preparation: Ulcer Cleansing: Ulcer Cleansing: N/A Rinsed/Irrigated with Rinsed/Irrigated with Saline  Saline Topical Anesthetic Topical Anesthetic Applied: Other: lidocaine Applied: None 4% Treatment Notes MARRIO, SCRIBNER ([id number]) Electronic Signature(s) Signed: 04/11/2016 5:18:04 PM By: Alejandro Mulling Entered By: Alejandro Mulling on 04/11/2016 09:18:37 [name] ([id number]) -------------------------------------------------------------------------------- Multi-Disciplinary Care Plan Details Patient Name: [name] Date of Service: 04/11/2016 8:45 AM Medical Record Number: [id number] Patient Account Number: [id number] Date of Birth/Sex: 1946-08-16 (70 y.o. Male) Treating RN: Phillis Haggis Primary Care Physician: Oretha Milch Other Clinician: Referring Physician: Oretha Milch Treating Physician/Extender: Rudene Re in Treatment: 61 Active Inactive Abuse / Safety / Falls / Self Care Management Nursing Diagnoses: Potential for falls Goals: Patient will remain injury free Date Initiated: 03/01/2016 Goal Status: Active Interventions: Assess fall risk on admission and as needed Notes: Nutrition Nursing Diagnoses: Imbalanced nutrition Goals: Patient/caregiver agrees to and verbalizes understanding of need to use nutritional supplements and/or vitamins as prescribed Date Initiated: 03/01/2016 Goal Status: Active Interventions: Assess patient nutrition upon admission and as needed per policy Notes: Orientation to the Wound Care Program Nursing Diagnoses: Knowledge deficit related to the wound healing center program Goals: Patient/caregiver will verbalize understanding of the [facility]ound [facility]aling Center [address] TREVYON, SWOR ([id number]) Date Initiated: 03/01/2016 Goal Status: Active Interventions: Provide education on orientation to the wound center Notes: Pain, Acute or Chronic Nursing Diagnoses: Pain, acute or chronic: actual or potential Potential alteration in comfort, pain Goals: Patient will verbalize adequate pain control and receive pain  control interventions during procedures as needed Date Initiated: 03/01/2016 Goal Status: Active Patient/caregiver will verbalize adequate pain control between visits Date Initiated: 03/01/2016 Goal Status: Active Interventions: Assess comfort goal upon admission Complete pain assessment as per visit requirements Notes: Pressure Nursing Diagnoses: Knowledge deficit related to causes and risk factors for pressure ulcer development Knowledge deficit related to management of pressures ulcers Goals: Patient/caregiver will verbalize risk factors for pressure ulcer development Date Initiated: 03/01/2016 Goal Status: Active Interventions: Assess offloading mechanisms upon admission and as needed Notes: Wound/Skin Impairment Nursing Diagnoses: ANTONEY, BIVEN ([id number]) Impaired tissue integrity Goals: Ulcer/skin breakdown will have a volume reduction of 30% by week 4 Date Initiated: 03/01/2016 Goal Status: Active Ulcer/skin breakdown will have a volume reduction of 50% by week 8 Date Initiated: 03/01/2016 Goal Status: Active Ulcer/skin breakdown will have a volume reduction of 80% by week 12 Date Initiated: 03/01/2016 Goal Status: Active Interventions: Assess ulceration(s) every visit Notes: Electronic Signature(s) Signed: 04/11/2016 5:18:04 PM By: Alejandro Mulling Entered By: Alejandro Mulling on 04/11/2016 09:18:31 Grabski, [name] Seth Bake ([id number]) -------------------------------------------------------------------------------- Pain Assessment Details Patient Name: [name] Date of Service: 04/11/2016 8:45 AM Medical Record Number: [id number] Patient Account Number: [id number] Date of Birth/Sex: [birth year] (70 y.o. Male) Treating RN: Phillis Haggis Primary Care Physician: Oretha Milch Other Clinician: Referring Physician: Oretha Milch Treating Physician/Extender: Rudene Re in Treatment: 14 Active Problems Location of Pain Severity and Description of  Pain Patient Has Paino No Site Locations Pain Management and Medication Current Pain Management: Electronic Signature(s) Signed:  04/11/2016 5:18:04 PM By: Alejandro Mulling Entered By: Alejandro Mulling on 04/11/2016 08:48:31 [name] ([id number]) -------------------------------------------------------------------------------- Patient/Caregiver Education Details Patient Name: [name] Date of Service: 04/11/2016 8:45 AM Medical Record Number: [id number] Patient Account Number: [id number] Date of Birth/Gender: 01-Nov-1946 (70 y.o. Male) Treating RN: Phillis Haggis Primary Care Physician: Oretha Milch Other Clinician: Referring Physician: Oretha Milch Treating Physician/Extender: Rudene Re in Treatment: 14 Education Assessment Education Provided To: Patient Education Topics Provided Wound/Skin Impairment: Handouts: Other: change heel dressing as ordered, do not get wraps wet Methods: Demonstration, Explain/Verbal Responses: State content correctly Electronic Signature(s) Signed: 04/11/2016 5:18:04 PM By: Alejandro Mulling Entered By: Alejandro Mulling on 04/11/2016 09:28:29 [name] ([id number]) -------------------------------------------------------------------------------- Wound Assessment Details Patient Name: [name] Date of Service: 04/11/2016 8:45 AM Medical Record Number: [id number] Patient Account Number: [id number] Date of Birth/Sex: Nov 03, 1946 (70 y.o. Male) Treating RN: Phillis Haggis Primary Care Physician: Oretha Milch Other Clinician: Referring Physician: Oretha Milch Treating Physician/Extender: Rudene Re in Treatment: 14 Wound Status Wound Number: 1 Primary Venous Leg Ulcer Etiology: Wound Location: Left Lower Leg Wound Open Wounding Event: Gradually Appeared Status: Date Acquired: 12/31/2014 Comorbid Cataracts, Chronic Obstructive Weeks Of Treatment: 14 History: Pulmonary Disease (COPD), Sleep Clustered Wound:  No Apnea, Angina, Congestive Heart Failure, Hypertension, Type II Diabetes, Gout, Osteoarthritis, Neuropathy Photos Photo Uploaded By: Alejandro Mulling on 04/11/2016 17:20:20 Wound Measurements Length: (cm) 5 Width: (cm) 3 Depth: (cm) 0.1 Area: (cm) 11.781 Volume: (cm) 1.178 % Reduction in Area: 51.9% % Reduction in Volume: 51.9% Epithelialization: Large (67-100%) Tunneling: No Undermining: No Wound Description Classification: Partial Thickness Foul Odor A Diabetic Severity (Wagner): Grade 1 Wound Margin: Flat and Intact Exudate Amount: Large Exudate Type: Serous Exudate Color: amber fter Cleansing: No Wound Bed Granulation Amount: Large (67-100%) Exposed Structure Banh, [name] ([id number]) Granulation Quality: Pink Fascia Exposed: No Necrotic Amount: None Present (0%) Fat Layer Exposed: No Tendon Exposed: No Muscle Exposed: No Joint Exposed: No Bone Exposed: No Limited to Skin Breakdown Periwound Skin Texture Texture Color No Abnormalities Noted: No No Abnormalities Noted: No Localized Edema: Yes Erythema: Yes Erythema Location: Circumferential Moisture No Abnormalities Noted: No Temperature / Pain Moist: Yes Temperature: No Abnormality Tenderness on Palpation: Yes Wound Preparation Ulcer Cleansing: Other: soap and water, Topical Anesthetic Applied: None Treatment Notes Wound #1 (Left Lower Leg) 1. Cleansed with: Cleanse wound with antibacterial soap and water 4. Dressing Applied: Aquacel Ag 5. Secondary Dressing Applied Dry Gauze 7. Secured with Tape 2 Layer Lite Compression System - Bilateral Electronic Signature(s) Signed: 04/11/2016 5:18:04 PM By: Alejandro Mulling Entered By: Alejandro Mulling on 04/11/2016 09:09:18 [name] ([id number]) -------------------------------------------------------------------------------- Wound Assessment Details Patient Name: [name] Date of Service: 04/11/2016 8:45 AM Medical Record  Number: [id number] Patient Account Number: [id number] Date of Birth/Sex: February 28, 1946 (70 y.o. Male) Treating RN: Phillis Haggis Primary Care Physician: Oretha Milch Other Clinician: Referring Physician: Oretha Milch Treating Physician/Extender: Rudene Re in Treatment: 14 Wound Status Wound Number: 2 Primary Venous Leg Ulcer Etiology: Wound Location: Right Lower Leg Wound Open Wounding Event: Gradually Appeared Status: Date Acquired: 12/31/2014 Comorbid Cataracts, Chronic Obstructive Weeks Of Treatment: 14 History: Pulmonary Disease (COPD), Sleep Clustered Wound: No Apnea, Angina, Congestive Heart Failure, Hypertension, Type II Diabetes, Gout, Osteoarthritis, Neuropathy Photos Photo Uploaded By: Alejandro Mulling on 04/11/2016 17:20:44 Wound Measurements Length: (cm) 0.3 Width: (cm) 0.3 Depth: (cm) 0.1 Area: (cm) 0.071 Volume: (cm) 0.007 % Reduction in Area: 69.9% % Reduction in Volume: 70.8% Epithelialization: Large (67-100%) Tunneling: No  Undermining: No Wound Description Classification: Partial Thickness Foul Odor Af Diabetic Severity [address]): Grade 1 Wound Margin: Flat and Intact Exudate Amount: Large Exudate Type: Serous Exudate Color: amber ter Cleansing: No Wound Bed Granulation Amount: Large (67-100%) Exposed Structure Blankenbaker, Izen V. ([id number]) Granulation Quality: Red Fascia Exposed: No Necrotic Amount: Small (1-33%) Fat Layer Exposed: No Necrotic Quality: Adherent Slough Tendon Exposed: No Muscle Exposed: No Joint Exposed: No Bone Exposed: No Limited to Skin Breakdown Periwound Skin Texture Texture Color No Abnormalities Noted: No No Abnormalities Noted: No Localized Edema: No Erythema: Yes Erythema Location: Circumferential Moisture No Abnormalities Noted: No Temperature / Pain Maceration: Yes Temperature: No Abnormality Moist: Yes Tenderness on Palpation: Yes Wound Preparation Ulcer Cleansing: Other: soap and  water, Topical Anesthetic Applied: None Treatment Notes Wound #2 (Right Lower Leg) 1. Cleansed with: Cleanse wound with antibacterial soap and water 4. Dressing Applied: Aquacel Ag 5. Secondary Dressing Applied Dry Gauze 7. Secured with Tape 2 Layer Lite Compression System - Bilateral Electronic Signature(s) Signed: 04/11/2016 5:18:04 PM By: Alejandro Mulling Entered By: Alejandro Mulling on 04/11/2016 09:08:28 [name] ([id number]) -------------------------------------------------------------------------------- Wound Assessment Details Patient Name: [name] Date of Service: 04/11/2016 8:45 AM Medical Record Number: [id number] Patient Account Number: [id number] Date of Birth/Sex: 12-10-1945 (70 y.o. Male) Treating RN: Phillis Haggis Primary Care Physician: Oretha Milch Other Clinician: Referring Physician: Oretha Milch Treating Physician/Extender: Rudene Re in Treatment: 14 Wound Status Wound Number: 3 Primary Pressure Ulcer Etiology: Wound Location: Right Calcaneus Wound Open Wounding Event: Pressure Injury Status: Date Acquired: 10/31/2015 Comorbid Cataracts, Chronic Obstructive Weeks Of Treatment: 14 History: Pulmonary Disease (COPD), Sleep Clustered Wound: No Apnea, Angina, Congestive Heart Failure, Hypertension, Type II Diabetes, Gout, Osteoarthritis, Neuropathy Photos Photo Uploaded By: Alejandro Mulling on 04/11/2016 17:20:44 Wound Measurements Length: (cm) 1.5 Width: (cm) 3 Depth: (cm) 0.2 Area: (cm) 3.534 Volume: (cm) 0.707 % Reduction in Area: 50% % Reduction in Volume: 50% Epithelialization: None Tunneling: No Undermining: No Wound Description 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  ([id number]) Granulation Quality: Red, Pink Fascia Exposed: No Necrotic Amount: Medium (34-66%) Fat Layer Exposed: No Necrotic Quality: Adherent Slough Tendon Exposed: No Muscle Exposed: No Joint Exposed: No Bone Exposed: No Limited to Skin Breakdown Periwound Skin Texture Texture Color No Abnormalities Noted: No No Abnormalities Noted: No Localized Edema: Yes Erythema: Yes Erythema Location: Circumferential Moisture No Abnormalities Noted: No Temperature / Pain Maceration: Yes Temperature: No Abnormality Moist: Yes Tenderness on Palpation: Yes Wound Preparation Ulcer Cleansing: Rinsed/Irrigated with Saline Topical Anesthetic Applied: Other: lidocaine 4%, Treatment Notes Wound #3 (Right Calcaneus) 1. Cleansed with: Cleanse wound with antibacterial soap and water 2. Anesthetic Topical Lidocaine 4% cream to wound bed prior to debridement 3. Peri-wound Care: Barrier cream 4. Dressing Applied: Santyl Ointment 5. Secondary Dressing Applied ABD Pad Dry Gauze Kerlix/Conform 7. Secured with Secretary/administrator) Signed: 04/11/2016 5:18:04 PM By: Alejandro Mulling Entered By: Alejandro Mulling on 04/11/2016 09:12:39 [name] ([id number]) -------------------------------------------------------------------------------- Wound Assessment Details Patient Name: [name] Date of Service: 04/11/2016 8:45 AM Medical Record Number: [id number] Patient Account Number: [id number] Date of Birth/Sex: 1946-06-04 (70 y.o. Male) Treating RN: Phillis Haggis Primary Care Physician: Oretha Milch Other Clinician: Referring Physician: Oretha Milch Treating Physician/Extender: Rudene Re in Treatment: 14 Wound Status Wound Number: 6 Primary Pressure Ulcer Etiology:  Wound Location: Right Foot - Lateral Wound Open Wounding Event: Pressure Injury Status: Date Acquired: 02/16/2016 Comorbid Cataracts, Chronic Obstructive Weeks Of Treatment: 7 History: Pulmonary  Disease (COPD), Sleep Clustered Wound: No Apnea, Angina, Congestive Heart Failure, Hypertension, Type II Diabetes, Gout, Osteoarthritis, Neuropathy Photos Photo Uploaded By: Alejandro Mulling on 04/11/2016 17:21:42 Wound Measurements Length: (cm) 0.3 Width: (cm) 0.3 Depth: (cm) 0.2 Area: (cm) 0.071 Volume: (cm) 0.014 % Reduction in Area: 54.8% % Reduction in Volume: 12.5% Epithelialization: None Tunneling: No Undermining: No Wound Description Classification: Category/Stage II Foul Odor Aft Diabetic Severity (Wagner): Grade 1 Wound Margin: Flat and Intact Exudate Amount: Large Exudate Type: Serosanguineous Exudate Color: red, brown er Cleansing: No Wound Bed Granulation Amount: None Present (0%) Exposed Structure Robards, Cheney V. ([id number]) Necrotic Amount: Large (67-100%) Fascia Exposed: No Necrotic Quality: Eschar, Adherent Slough Fat Layer Exposed: No Tendon Exposed: No Muscle Exposed: No Joint Exposed: No Bone Exposed: No Limited to Skin Breakdown Periwound Skin Texture Texture Color No Abnormalities Noted: No No Abnormalities Noted: No Moisture Temperature / Pain No Abnormalities Noted: No Temperature: No Abnormality Moist: Yes Tenderness on Palpation: Yes Wound Preparation Ulcer Cleansing: Rinsed/Irrigated with Saline Topical Anesthetic Applied: Other: lidocaine 4%, Treatment Notes Wound #6 (Right, Lateral Foot) 1. Cleansed with: Cleanse wound with antibacterial soap and water 2. Anesthetic Topical Lidocaine 4% cream to wound bed prior to debridement 3. Peri-wound Care: Barrier cream 4. Dressing Applied: Aquacel Ag 5. Secondary Dressing Applied ABD Pad Electronic Signature(s) Signed: 04/11/2016 5:18:04 PM By: Alejandro Mulling Entered By: Alejandro Mulling on 04/11/2016 09:12:09 [name] ([id number]) -------------------------------------------------------------------------------- Wound Assessment Details Patient Name: [name] Date of Service: 04/11/2016 8:45 AM Medical Record Number: [id number] Patient Account Number: [id number] Date of Birth/Sex: 1946/01/20 (70 y.o. Male) Treating RN: Phillis Haggis Primary Care Physician: Oretha Milch Other Clinician: Referring Physician: Oretha Milch Treating Physician/Extender: Rudene Re in Treatment: 14 Wound Status Wound Number: 7 Primary Pressure Ulcer Etiology: Wound Location: Coccyx - Midline Wound Open Wounding Event: Pressure Injury Status: Date Acquired: 02/23/2016 Comorbid Cataracts, Chronic Obstructive Weeks Of Treatment: 6 History: Pulmonary Disease (COPD), Sleep Clustered Wound: No Apnea, Angina, Congestive Heart Failure, Hypertension, Type II Diabetes, Gout, Osteoarthritis, Neuropathy Wound Measurements Length: (cm) 0.1 Width: (cm) 0.1 Depth: (cm) 0.1 Area: (cm) 0.008 Volume: (cm) 0.001 % Reduction in Area: 100% % Reduction in Volume: 100% Epithelialization: None Tunneling: No Undermining: No Wound Description Classification: Category/Stage II Foul Odor Afte Wound Margin: Flat and Intact Exudate Amount: Small Exudate Type: Serous Exudate Color: amber r Cleansing: No Wound Bed Granulation Amount: Large (67-100%) Exposed Structure Granulation Quality: Red Fascia Exposed: No Necrotic Amount: None Present (0%) Fat Layer Exposed: No Tendon Exposed: No Muscle Exposed: No Joint Exposed: No Bone Exposed: No Limited to Skin Breakdown Periwound Skin Texture Texture Color No Abnormalities Noted: No No Abnormalities Noted: No Callus: No Atrophie Blanche: No Rupert, Guerino V. ([id number]) Crepitus: No Cyanosis: No Excoriation: No Ecchymosis: No Fluctuance: No Erythema: No Friable: No Hemosiderin Staining: No Induration: No Mottled: No Localized Edema: No Pallor: No Rash: No Rubor: No Scarring: No Temperature / Pain Moisture Temperature: No Abnormality No Abnormalities Noted: No Tenderness on Palpation: Yes Dry /  Scaly: No Maceration: No Moist: Yes Wound Preparation Ulcer Cleansing: Rinsed/Irrigated with Saline Topical Anesthetic Applied: None Treatment Notes Wound #7 (Midline Coccyx) 1. Cleansed with: Clean wound with Normal Saline 3. Peri-wound Care: Barrier cream 5. Secondary Dressing Applied Bordered Foam Dressing Electronic Signature(s) Signed: 04/11/2016 5:18:04 PM By: Alejandro Mulling Entered By:  Alejandro Mulling on 04/11/2016 09:14:22 AMES, HOBAN ([id number]) -------------------------------------------------------------------------------- Vitals Details Patient Name: [name] Date of Service: 04/11/2016 8:45 AM Medical Record Number: [id number] Patient Account Number: [id number] Date of Birth/Sex: 1946-02-27 (70 y.o. Male) Treating RN: Phillis Haggis Primary Care Physician: Oretha Milch Other Clinician: Referring Physician: Oretha Milch Treating Physician/Extender: Rudene Re in Treatment: 14 Vital Signs Time Taken: 08:50 Temperature (F): 98.4 Height (in): 70 Pulse (bpm): 88 Weight (lbs): 235 Respiratory Rate (breaths/min): 18 Body Mass Index (BMI): 33.7 Blood Pressure (mmHg): 122/73 Reference Range: 80 - 120 mg / dl Electronic Signature(s) Signed: 04/11/2016 5:18:04 PM By: Alejandro Mulling Entered By: Alejandro Mulling on 04/11/2016 08:51:35

## 2016-04-18 ENCOUNTER — Encounter: Payer: Medicare Other | Admitting: Surgery

## 2016-04-18 DIAGNOSIS — E11621 Type 2 diabetes mellitus with foot ulcer: Secondary | ICD-10-CM | POA: Diagnosis not present

## 2016-04-18 NOTE — Progress Notes (Addendum)
[name], [name] ([id number]) Visit Report for 04/18/2016 Chief Complaint Document Details Patient Name: [name], [name] Date of Service: 04/18/2016 8:30 AM Medical Record Number: [id number] Patient Account Number: [id number] Date of Birth/Sex: 01/07/1946 (70 y.o. Male) Treating RN: Phillis Haggis Primary Care Physician: Oretha Milch Other Clinician: Referring Physician: Oretha Milch Treating Physician/Extender: Rudene Re in Treatment: 15 Information Obtained from: Patient Chief Complaint Patient is at the clinic for treatment of an open pressure ulcer the left upper thigh and gluteal region and the right heel with bilateral swelling of the legs all of it which is going on for over a year Electronic Signature(s) Signed: 04/18/2016 9:09:43 AM By: Evlyn Kanner MD, FACS Entered By: Evlyn Kanner on 04/18/2016 09:09:43 [name] ([id number]) -------------------------------------------------------------------------------- Debridement Details Patient Name: [name] Date of Service: 04/18/2016 8:30 AM Medical Record Number: [id number] Patient Account Number: [id number] Date of Birth/Sex: 29-Jan-1946 (70 y.o. Male) Treating RN: Phillis Haggis Primary Care Physician: Oretha Milch Other Clinician: Referring Physician: Oretha Milch Treating Physician/Extender: Rudene Re in Treatment: 15 Debridement Performed for Wound #3 Right Calcaneus Assessment: Performed By: Physician Evlyn Kanner, MD Debridement: Debridement Pre-procedure Yes Verification/Time Out Taken: Start Time: 09:05 Pain Control: Lidocaine 4% Topical Solution Level: Skin/Subcutaneous Tissue Total Area Debrided (L x 1.5 (cm) x 3 (cm) = 4.5 (cm) W): Tissue and other Viable, Non-Viable, Exudate, Fibrin/Slough, Subcutaneous material debrided: Instrument: Curette Bleeding: Minimum Hemostasis Achieved: Pressure End Time: 09:07 Procedural Pain: 0 Post Procedural Pain: 0 Response to Treatment:  Procedure was tolerated well Post Debridement Measurements of Total Wound Length: (cm) 1.5 Stage: Category/Stage III Width: (cm) 3 Depth: (cm) 0.3 Volume: (cm) 1.06 Post Procedure Diagnosis Same as Pre-procedure Electronic Signature(s) Signed: 04/18/2016 9:09:36 AM By: Evlyn Kanner MD, FACS Signed: 04/18/2016 4:02:17 PM By: Alejandro Mulling Entered By: Evlyn Kanner on 04/18/2016 09:09:36 [name] ([id number]) -------------------------------------------------------------------------------- HPI Details Patient Name: [name] Date of Service: 04/18/2016 8:30 AM Medical Record Number: [id number] Patient Account Number: [id number] Date of Birth/Sex: February 22, 1946 (70 y.o. Male) Treating RN: Phillis Haggis Primary Care Physician: Oretha Milch Other Clinician: Referring Physician: Oretha Milch Treating Physician/Extender: Rudene Re in Treatment: 15 History of Present Illness Location: ulcerated area on the right heel, left gluteal region and thigh and then bilateral lower extremities Quality: Patient reports experiencing a sharp pain to affected area(s). Severity: Patient states wound are getting worse. Duration: Patient has had the wound for > 12 months prior to seeking treatment at the wound center Timing: Pain in wound is constant (hurts all the time) Context: The wound appeared gradually over time Modifying Factors: Other treatment(s) tried include: he sees his heart doctor and his primary care doctor Associated Signs and Symptoms: patient has not been able to walk for over a year now HPI Description: 70 year old gentleman with a known history of hypertension, diabetes, obstructive sleep apnea, COPD, diastolic CHF, coronary artery disease was admitted to the hospital with sepsis from an ulcer of the right heel and was treated there in October 2016. he also has chronic bilateral lower extremity edema and lymphedema. He had received vancomycin, Zosyn and at that  stage and x-rays showed hardware in the right ankle but no evidence of osteomyelitis. he was a former smoker. he was also treated with Augmentin orally for 10 days. He is either bedbound or wheelchair-bound and does not ambulate by himself. 01/19/2016 -- he has not been seen here for 3 weeks and this was because he was admitted to [facility]  between 223 and 01/08/2016 for sepsis, UTI and pneumonia involving the left lung. he was treated with IV vancomycin and Zosyn and then meropenem. Was discharged home on oral Bactrim for 2 weeks none of his vascular test or x-rays were done and we will reorder these. 01/26/2016 -- he has not yet done the x-ray of his foot and is vascular tests are still pending. I have asked him to work on these with his nursing home staff. Addendum: he has got an x-ray of the right foot done which shows that his osteopenia but no specific ostial lysis or abnormal periosteal reaction. Final impression was degenerative changes with dorsal foot soft tissue swelling. 02/16/2016 -- lower extremity arterial duplex examination shows a 50-99% stenosis of the right tibioperoneal trunk. He had biphasic flow in the right SFA, popliteal and tibioperoneal trunk. Left-sided he had triphasic flow throughout 02/29/2016 -- he is awaiting his vascular opinion with Dr. Gilda Crease which is to be done on April 24 03/08/2016 -- he has not kept his appointment with Dr. Gilda Crease on April 24 and does not seem to know what happened about this. it's difficult to gauge whether he is in full control of his mental faculties as at times he is extremely rude to the nursing staff. 03/22/2016 -- the patient was seen by Dr. 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 Recommended that the patient had severe atherosclerotic changes of both lower extremities associated with  ulceration and tissue loss of the foot. This is a limb threatening ischemia and the patient was recommended to undergo Waldeck, [name] ([id number]) angiography of the lower extremity with a hope for intervention for limb salvage. Patient agreed and will proceed to angiography. He was admitted to the hospital between May 2 and 03/15/2016 - he underwent induction of a catheter into his right lower extremity and third order catheter placement with contrast injection to the right lower extremity for distal runoff. Percutaneous transluminal angioplasty of the right superficial femoral and popliteal arteries were done. He also had a right peroneal angioplasty. Patient had a postoperative hematoma and was admitted for observation and in the next 24 hours he was very agitated and combative and had to be seen by psychiatric. He had a follow-up with Dr. Gilda Crease in 3 weeks. 04/18/2016 -- notes reviewed from the vascular office where he was seen for his postop visit after the PTA of the right SFA and popliteal arteries on 03/12/2016. He underwent an ABI which showed right ABI to be more than 1.3 and left to be more than 1 more than 1.3, great toe and PPG waveforms are decreased bilaterally. No additional intervention indicated at this time and the patient was to follow-up in 3 months with an ABI and bilateral lower extremity duplex study. Electronic Signature(s) Signed: 04/18/2016 9:09:59 AM By: Evlyn Kanner MD, FACS Previous Signature: 04/18/2016 8:57:17 AM Version By: Evlyn Kanner MD, FACS Entered By: Evlyn Kanner on 04/18/2016 09:09:59 [name] ([id number]) -------------------------------------------------------------------------------- Physical Exam Details Patient Name: [name] Date of Service: 04/18/2016 8:30 AM Medical Record Number: [id number] Patient Account Number: [id number] Date of Birth/Sex: 07/13/1946 (70 y.o. Male) Treating RN: Phillis Haggis Primary Care Physician:  Oretha Milch Other Clinician: Referring Physician: Oretha Milch Treating Physician/Extender: Rudene Re in Treatment: 15 Constitutional . Pulse regular. Respirations normal and unlabored. Afebrile. . Eyes Nonicteric. Reactive to light. Ears, Nose, Mouth, and Throat Lips, teeth,  and gums WNL.Marland Kitchen Moist mucosa without lesions. Neck supple and nontender. No palpable supraclavicular or cervical adenopathy. Normal sized without goiter. Respiratory WNL. No retractions.. Cardiovascular Pedal Pulses WNL. No clubbing, cyanosis or edema. Lymphatic No adneopathy. No adenopathy. No adenopathy. Musculoskeletal Adexa without tenderness or enlargement.. Digits and nails w/o clubbing, cyanosis, infection, petechiae, ischemia, or inflammatory conditions.. Integumentary (Hair, Skin) No suspicious lesions. No crepitus or fluctuance. No peri-wound warmth or erythema. No masses.Marland Kitchen Psychiatric Judgement and insight Intact.. No evidence of depression, anxiety, or agitation.. Notes the medial right calcaneus looks very clean after sharp debridement with a curette and brisk bleeding controlled with pressure. The right lateral foot continues to have a very small opening. Both lower extremities continue to have lymphedema with superficial ulcerations. Electronic Signature(s) Signed: 04/18/2016 9:10:39 AM By: Evlyn Kanner MD, FACS Entered By: Evlyn Kanner on 04/18/2016 09:10:39 [name] ([id number]) -------------------------------------------------------------------------------- Physician Orders Details Patient Name: [name] Date of Service: 04/18/2016 8:30 AM Medical Record Number: [id number] Patient Account Number: [id number] Date of Birth/Sex: 01-Jul-1946 (70 y.o. Male) Treating RN: Phillis Haggis Primary Care Physician: Oretha Milch Other Clinician: Referring Physician: Oretha Milch Treating Physician/Extender: Rudene Re in Treatment: 15 Verbal / Phone Orders:  Yes Clinician: Ashok Cordia, Debi Read Back and Verified: Yes Diagnosis Coding Wound Cleansing Wound #1 Left Lower Leg o May shower with protection. - please cover wraps and keep dry o No tub bath. Wound #2 Right Lower Leg o May shower with protection. - please cover wraps and keep dry o No tub bath. Wound #3 Right Calcaneus o May shower with protection. - please cover wraps and keep dry o No tub bath. Wound #6 Right,Lateral Foot o May shower with protection. - please cover wraps and keep dry o No tub bath. Anesthetic Wound #1 Left Lower Leg o Topical Lidocaine 4% cream applied to wound bed prior to debridement - for clinic use only Wound #3 Right Calcaneus o Topical Lidocaine 4% cream applied to wound bed prior to debridement - for clinic use only Wound #6 Right,Lateral Foot o Topical Lidocaine 4% cream applied to wound bed prior to debridement - for clinic use only Skin Barriers/Peri-Wound Care Wound #3 Right Calcaneus o Barrier cream Wound #6 Right,Lateral Foot o Barrier cream Primary Wound Dressing Wound #1 Left Lower Leg o Aquacel Ag Cork, Kaceton [name]. ([id number]) Wound #2 Right Lower Leg o Aquacel Ag Wound #3 Right Calcaneus o Aquacel Ag Wound #6 Right,Lateral Foot o Aquacel Ag Secondary Dressing Wound #1 Left Lower Leg o ABD pad Wound #2 Right Lower Leg o ABD pad Wound #3 Right Calcaneus o ABD pad o Dry Gauze o Conform/Kerlix Wound #6 Right,Lateral Foot o ABD pad Dressing Change Frequency Wound #1 Left Lower Leg o Change dressing every week - in clinic ******IF THE PT MISSES HIS APPT FOR THE WEEK NURSING AT THE FACILITY NEEDS TO CHANGE WRAPS AND DRESSINGS ON LEGS AND FOOT***** Wound #2 Right Lower Leg o Change dressing every week - in clinic ******IF THE PT MISSES HIS APPT FOR THE WEEK NURSING AT THE FACILITY NEEDS TO CHANGE WRAPS AND DRESSINGS ON LEGS AND FOOT***** Wound #3 Right Calcaneus o Change  dressing every day. - to be changed daily by SNF RN Wound #6 Right,Lateral Foot o Change dressing every week - in clinic ******IF THE PT MISSES HIS APPT FOR THE WEEK NURSING AT THE FACILITY NEEDS TO CHANGE WRAPS AND DRESSINGS ON LEGS AND FOOT***** Follow-up Appointments Wound #1 Left Lower Leg o Return Appointment in 1  week. BENJIMEN, [name] VMarland Kitchen ([id number]) Wound #2 Right Lower Leg o Return Appointment in 1 week. Wound #3 Right Calcaneus o Return Appointment in 1 week. Wound #6 Right,Lateral Foot o Return Appointment in 1 week. Edema Control Wound #1 Left Lower Leg o 2 Layer Lite Compression System - Bilateral - kerlix and coban o Elevate legs to the level of the heart and pump ankles as often as possible Wound #2 Right Lower Leg o 2 Layer Lite Compression System - Bilateral - kerlix and coban o Elevate legs to the level of the heart and pump ankles as often as possible - ******IF THE PT MISSES HIS APPT FOR THE WEEK NURSING AT THE FACILITY NEEDS TO CHANGE WRAPS AND DRESSINGS ON LEGS AND FOOT***** Wound #6 Right,Lateral Foot o 2 Layer Lite Compression System - Bilateral - kerlix and coban o Elevate legs to the level of the heart and pump ankles as often as possible - ******IF THE PT MISSES HIS APPT FOR THE WEEK NURSING AT THE FACILITY NEEDS TO CHANGE WRAPS AND DRESSINGS ON LEGS AND FOOT***** Off-Loading Wound #1 Left Lower Leg o Turn and reposition every 2 hours o Other: - sage boots Wound #2 Right Lower Leg o Turn and reposition every 2 hours o Other: - sage boots Wound #3 Right Calcaneus o Turn and reposition every 2 hours o Other: - sage boots Wound #6 Right,Lateral Foot o Turn and reposition every 2 hours o Other: - sage boots Additional Orders / Instructions Wound #1 Left Lower Leg o Increase protein intake. [name], [name] ([id number]) Wound #2 Right Lower Leg o Increase protein intake. Wound #3 Right Calcaneus o Increase  protein intake. Wound #6 Right,Lateral Foot o Increase protein intake. Medications-please add to medication list. Wound #1 Left Lower Leg o Other: - Vitamin C, Vitamin A, Zinc, multivitamin Wound #2 Right Lower Leg o Other: - Vitamin C, Vitamin A, Zinc, multivitamin Wound #3 Right Calcaneus o Other: - Vitamin C, Vitamin A, Zinc, multivitamin Wound #6 Right,Lateral Foot o Other: - Vitamin C, Vitamin A, Zinc, multivitamin Notes Please keep Desitin (zinc) on coccyx area and keep clean and dry. Electronic Signature(s) Signed: 04/18/2016 3:49:49 PM By: Evlyn Kanner MD, FACS Signed: 04/18/2016 4:02:17 PM By: Alejandro Mulling Entered By: Alejandro Mulling on 04/18/2016 09:12:54 [name] ([id number]) -------------------------------------------------------------------------------- Problem List Details Patient Name: [name] Date of Service: 04/18/2016 8:30 AM Medical Record Number: [id number] Patient Account Number: [id number] Date of Birth/Sex: 08-27-1946 (70 y.o. Male) Treating RN: Phillis Haggis Primary Care Physician: Oretha Milch Other Clinician: Referring Physician: Oretha Milch Treating Physician/Extender: Rudene Re in Treatment: 15 Active Problems ICD-10 Encounter Code Description Active Date Diagnosis E11.621 Type 2 diabetes mellitus with foot ulcer 01/01/2016 Yes L89.613 Pressure ulcer of right heel, stage 3 01/01/2016 Yes E66.01 Morbid (severe) obesity due to excess calories 01/01/2016 Yes I89.0 Lymphedema, not elsewhere classified 01/01/2016 Yes M70.871 Other soft tissue disorders related to use, overuse and 01/19/2016 Yes pressure, right ankle and foot 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/16/2016 Yes ulceration of heel and midfoot Inactive Problems Resolved Problems ICD-10 Code Description Active Date Resolved Date L89.322 Pressure ulcer of left buttock, stage 2 01/01/2016 01/01/2016 Electronic Signature(s) Signed:  04/18/2016 9:09:26 AM By: Evlyn Kanner MD, FACS Entered By: Evlyn Kanner on 04/18/2016 09:09:26 [name] ([id number]) [name], [name] VMarland Kitchen ([id number]) -------------------------------------------------------------------------------- Progress Note Details Patient Name: [name] Date of Service: 04/18/2016 8:30 AM Medical Record Number: [id number] Patient Account Number: [id number] Date of  Birth/Sex: 21-Apr-1946 (70 y.o. Male) Treating RN: Ashok Cordia, Debi Primary Care Physician: Oretha Milch Other Clinician: Referring Physician: Oretha Milch Treating Physician/Extender: Rudene Re in Treatment: 15 Subjective Chief Complaint Information obtained from Patient Patient is at the clinic for treatment of an open pressure ulcer the left upper thigh and gluteal region and the right heel with bilateral swelling of the legs all of it which is going on for over a year History of Present Illness (HPI) The following HPI elements were documented for the patient's wound: Location: ulcerated area on the right heel, left gluteal region and thigh and then bilateral lower extremities Quality: Patient reports experiencing a sharp pain to affected area(s). Severity: Patient states wound are getting worse. Duration: Patient has had the wound for > 12 months prior to seeking treatment at the wound center Timing: Pain in wound is constant (hurts all the time) Context: The wound appeared gradually over time Modifying Factors: Other treatment(s) tried include: he sees his heart doctor and his primary care doctor Associated Signs and Symptoms: patient has not been able to walk for over a year now 70 year old gentleman with a known history of hypertension, diabetes, obstructive sleep apnea, COPD, diastolic CHF, coronary artery disease was admitted to the hospital with sepsis from an ulcer of the right heel and was treated there in October 2016. he also has chronic bilateral lower extremity edema  and lymphedema. He had received vancomycin, Zosyn and at that stage and x-rays showed hardware in the right ankle but no evidence of osteomyelitis. he was a former smoker. he was also treated with Augmentin orally for 10 days. He is either bedbound or wheelchair-bound and does not ambulate by himself. 01/19/2016 -- he has not been seen here for 3 weeks and this was because he was admitted to [facility] between 223 and 01/08/2016 for sepsis, UTI and pneumonia involving the left lung. he was treated with IV vancomycin and Zosyn and then meropenem. Was discharged home on oral Bactrim for 2 weeks none of his vascular test or x-rays were done and we will reorder these. 01/26/2016 -- he has not yet done the x-ray of his foot and is vascular tests are still pending. I have asked him to work on these with his nursing home staff. Addendum: he has got an x-ray of the right foot done which shows that his osteopenia but no specific ostial lysis or abnormal periosteal reaction. Final impression was degenerative changes with dorsal foot soft tissue swelling. 02/16/2016 -- lower extremity arterial duplex examination shows a 50-99% stenosis of the right tibioperoneal trunk. He had biphasic flow in the right SFA, popliteal and tibioperoneal trunk. Left-sided he had triphasic flow throughout [name], [name] ([id number]) 02/29/2016 -- he is awaiting his vascular opinion with Dr. Gilda Crease which is to be done on April 24 03/08/2016 -- he has not kept his appointment with Dr. Gilda Crease on April 24 and does not seem to know what happened about this. it's difficult to gauge whether he is in full control of his mental faculties as at times he is extremely rude to the nursing staff. 03/22/2016 -- the patient was seen by Dr. 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 Recommended that  the patient had severe atherosclerotic changes of both lower extremities associated with ulceration and tissue loss of the foot. This is a limb threatening ischemia and the patient was recommended  to undergo angiography of the lower extremity with a hope for intervention for limb salvage. Patient agreed and will proceed to angiography. He was admitted to the hospital between May 2 and 03/15/2016 - he underwent induction of a catheter into his right lower extremity and third order catheter placement with contrast injection to the right lower extremity for distal runoff. Percutaneous transluminal angioplasty of the right superficial femoral and popliteal arteries were done. He also had a right peroneal angioplasty. Patient had a postoperative hematoma and was admitted for observation and in the next 24 hours he was very agitated and combative and had to be seen by psychiatric. He had a follow-up with Dr. Gilda Crease in 3 weeks. 04/18/2016 -- notes reviewed from the vascular office where he was seen for his postop visit after the PTA of the right SFA and popliteal arteries on 03/12/2016. He underwent an ABI which showed right ABI to be more than 1.3 and left to be more than 1 more than 1.3, great toe and PPG waveforms are decreased bilaterally. No additional intervention indicated at this time and the patient was to follow-up in 3 months with an ABI and bilateral lower extremity duplex study. Objective Constitutional Pulse regular. Respirations normal and unlabored. Afebrile. Vitals Time Taken: 8:39 AM, Height: 70 in, Weight: 235 lbs, BMI: 33.7, Temperature: 97.8 F, Pulse: 78 bpm, Respiratory Rate: 18 breaths/min, Blood Pressure: 125/75 mmHg. Eyes Nonicteric. Reactive to light. Ears, Nose, Mouth, and Throat Lips, teeth, and gums WNL.Marland Kitchen Moist mucosa without lesions. Neck supple and nontender. No palpable supraclavicular or cervical adenopathy. Normal sized without goiter. Respiratory WNL. No  retractions.Marland Kitchen Sadiq, Cyruss [name]. ([id number]) Cardiovascular Pedal Pulses WNL. No clubbing, cyanosis or edema. Lymphatic No adneopathy. No adenopathy. No adenopathy. Musculoskeletal Adexa without tenderness or enlargement.. Digits and nails w/o clubbing, cyanosis, infection, petechiae, ischemia, or inflammatory conditions.Marland Kitchen Psychiatric Judgement and insight Intact.. No evidence of depression, anxiety, or agitation.. General Notes: the medial right calcaneus looks very clean after sharp debridement with a curette and brisk bleeding controlled with pressure. The right lateral foot continues to have a very small opening. Both lower extremities continue to have lymphedema with superficial ulcerations. Integumentary (Hair, Skin) No suspicious lesions. No crepitus or fluctuance. No peri-wound warmth or erythema. No masses.. Wound #1 status is Open. Original cause of wound was Gradually Appeared. The wound is located on the Left Lower Leg. The wound measures 7.5cm length x 8cm width x 0.1cm depth; 47.124cm^2 area and 4.712cm^3 volume. The wound is limited to skin breakdown. There is no tunneling or undermining noted. There is a large amount of serous drainage noted. The wound margin is flat and intact. There is large (67- 100%) pink granulation within the wound bed. There is a small (1-33%) amount of necrotic tissue within the wound bed including Adherent Slough. The periwound skin appearance exhibited: Localized Edema, Moist, Erythema. The surrounding wound skin color is noted with erythema which is circumferential. Periwound temperature was noted as No Abnormality. The periwound has tenderness on palpation. Wound #2 status is Open. Original cause of wound was Gradually Appeared. The wound is located on the Right Lower Leg. The wound measures 0.3cm length x 0.3cm width x 0.1cm depth; 0.071cm^2 area and 0.007cm^3 volume. The wound is limited to skin breakdown. There is no tunneling or undermining  noted. There is a medium amount of serous drainage noted. The wound margin is flat and intact. There is large (67-100%) red granulation within the wound bed. There is a small (1-33%) amount of  necrotic tissue within the wound bed including Adherent Slough. The periwound skin appearance exhibited: Maceration, Moist, Erythema. The periwound skin appearance did not exhibit: Localized Edema. The surrounding wound skin color is noted with erythema which is circumferential. Periwound temperature was noted as No Abnormality. The periwound has tenderness on palpation. Wound #3 status is Open. Original cause of wound was Pressure Injury. The wound is located on the Right Calcaneus. The wound measures 1.5cm length x 3cm width x 0.2cm depth; 3.534cm^2 area and 0.707cm^3 volume. The wound is limited to skin breakdown. There is no tunneling or undermining noted. There is a large amount of serous drainage noted. The wound margin is flat and intact. There is medium (34-66%) red, pink granulation within the wound bed. There is a medium (34-66%) amount of necrotic tissue within the wound bed including Adherent Slough. The periwound skin appearance exhibited: Localized Edema, Maceration, Moist, Erythema. The surrounding wound skin color is noted with erythema which is circumferential. Periwound temperature was noted as No Abnormality. The periwound has tenderness on palpation. Wound #6 status is Open. Original cause of wound was Pressure Injury. The wound is located on the [place], Froilan [name]. ([id number]) Right,Lateral Foot. The wound measures 0.3cm length x 0.3cm width x 0.1cm depth; 0.071cm^2 area and 0.007cm^3 volume. The wound is limited to skin breakdown. There is no tunneling or undermining noted. There is a large amount of serosanguineous drainage noted. The wound margin is flat and intact. There is small (1-33%) pink granulation within the wound bed. There is a large (67-100%) amount of necrotic  tissue within the wound bed including Adherent Slough. The periwound skin appearance exhibited: Moist. Periwound temperature was noted as No Abnormality. The periwound has tenderness on palpation. Wound #7 status is Healed - Epithelialized. Original cause of wound was Pressure Injury. The wound is located on the Midline Coccyx. The wound measures 0cm length x 0cm width x 0cm depth; 0cm^2 area and 0cm^3 volume. 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 There was a Skin/Subcutaneous Tissue Debridement ([id number]) debridement with total area of 4.5 sq cm performed by Evlyn Kanner, MD. with the following instrument(s): Curette to remove Viable and Non-Viable tissue/material including Exudate, Fibrin/Slough, and Subcutaneous after achieving pain control using Lidocaine 4% Topical Solution. A time out was conducted prior to the start of the procedure. A Minimum amount of bleeding was controlled with Pressure. The procedure was tolerated well with a pain level of 0 throughout and a pain level of 0 following the procedure. Post Debridement Measurements: 1.5cm length x 3cm width x 0.3cm depth; 1.06cm^3 volume. Post debridement Stage noted as Category/Stage III. Post procedure Diagnosis Wound #3: Same as Pre-Procedure [name], [name] ([id number]) Plan Wound Cleansing: Wound #1 Left Lower Leg: May shower with protection. - please cover wraps and keep dry No tub bath. Wound #2 Right Lower Leg: May shower with protection. - please cover  wraps and keep dry No tub bath. Wound #3 Right Calcaneus: May shower with protection. - please cover wraps and keep dry No tub bath. Wound #6 Right,Lateral Foot: May  shower with protection. - please cover wraps and keep dry No tub bath. Anesthetic: Wound #1 Left Lower Leg: Topical Lidocaine 4% cream applied to wound bed prior to debridement - for clinic use only Wound #3 Right Calcaneus: Topical Lidocaine 4% cream applied to wound bed prior to debridement - for clinic use only Wound #6 Right,Lateral Foot: Topical Lidocaine 4% cream applied to wound bed prior to debridement - for clinic use only Skin Barriers/Peri-Wound Care: Wound #3 Right Calcaneus: Barrier cream Wound #6 Right,Lateral Foot: Barrier cream Primary Wound Dressing: Wound #1 Left Lower Leg: Aquacel Ag Wound #2 Right Lower Leg: Aquacel Ag Wound #3 Right Calcaneus: Aquacel Ag Wound #6 Right,Lateral Foot: Aquacel Ag Secondary Dressing: Wound #1 Left Lower Leg: ABD pad Wound #2 Right Lower Leg: ABD pad Wound #3 Right Calcaneus: ABD pad Dry Gauze Conform/Kerlix Wound #6 Right,Lateral Foot: ABD pad Dressing Change Frequency: Wound #1 Left Lower Leg: [name], Camrin [name]. ([id number]) Change dressing every week - in clinic ******IF THE PT MISSES HIS APPT FOR THE WEEK NURSING AT THE FACILITY NEEDS TO CHANGE WRAPS AND DRESSINGS ON LEGS AND FOOT***** Wound #2 Right Lower Leg: Change dressing every week - in clinic ******IF THE PT MISSES HIS APPT FOR THE WEEK NURSING AT THE FACILITY NEEDS TO CHANGE WRAPS AND DRESSINGS ON LEGS AND FOOT***** Wound #3 Right Calcaneus: Change dressing every day. - to be changed daily by [facility] RN Wound #6 Right,Lateral Foot: Change dressing every week - in clinic ******IF THE PT MISSES HIS APPT FOR THE WEEK NURSING AT THE FACILITY NEEDS TO CHANGE WRAPS AND DRESSINGS ON LEGS AND FOOT***** Follow-up Appointments: Wound #1 Left Lower Leg: Return Appointment in 1 week. Wound #2 Right Lower  Leg: Return Appointment in 1 week. Wound #3 Right Calcaneus: Return Appointment in 1 week. Wound #6 Right,Lateral Foot: Return Appointment in 1 week. Edema Control: Wound #1 Left Lower Leg: 2 Layer Lite Compression System - Bilateral - kerlix and coban Elevate legs to the level of the heart and pump ankles as often as possible Wound #2 Right Lower Leg: 2 Layer Lite Compression System - Bilateral - kerlix and coban Elevate legs to the level of the heart and pump ankles as often as possible - ******IF THE PT MISSES HIS APPT FOR THE WEEK NURSING AT THE FACILITY NEEDS TO CHANGE WRAPS AND DRESSINGS ON LEGS AND FOOT***** Wound #6 Right,Lateral Foot: 2 Layer Lite Compression System - Bilateral - kerlix and coban Elevate legs to the level of the heart and pump ankles as often as possible - ******IF THE PT MISSES HIS APPT FOR THE WEEK NURSING AT THE FACILITY NEEDS TO CHANGE WRAPS AND DRESSINGS ON LEGS AND FOOT***** Off-Loading: Wound #1 Left Lower Leg: Turn and reposition every 2 hours Other: - sage boots Wound #2 Right Lower Leg: Turn and reposition every 2 hours Other: - sage boots Wound #3 Right Calcaneus: Turn and reposition every 2 hours Other: - sage boots Wound #6 Right,Lateral Foot: Turn and reposition every 2 hours Other: - sage boots Additional Orders / Instructions: Wound #1 Left Lower Leg: Increase protein intake. Wound #2 Right Lower Leg: Increase protein intake. Carchi, Tymier [name]. ([id number]) Wound #3 Right Calcaneus: Increase protein intake. Wound #6 Right,Lateral Foot: Increase protein intake. Medications-please add to medication list.: Wound #1 Left Lower Leg: Other: - Vitamin C, Vitamin A, Zinc, multivitamin Wound #2 Right Lower Leg: Other: - Vitamin C, Vitamin A, Zinc, multivitamin Wound #3 Right Calcaneus: Other: - Vitamin C, Vitamin A, Zinc, multivitamin  Wound #6 Right,Lateral Foot: Other: - Vitamin C, Vitamin A, Zinc, multivitamin General Notes: Please  keep Desitin (zinc) on coccyx area and keep clean and dry. I have recommended: 1. Aquacell AG to his right heel. The patient also has awound on the right lateral foot. This will be treated with silver alginate 2. Constant off loading and also using Sage boot. 3. bilateral lower extremity 2 layer light compression, which he has been using before. 4. we have recommended juxta light stockings for him as he is going to be able to discontinue his compression wraps and use compression stockings 5. High-protein diet and multivitamins including vitamin C and zinc. 6. he also has a bit of excoriation on his coccyx and we have asked this area to be protected with a bordered foam. Electronic Signature(s) Signed: 04/18/2016 3:44:32 PM By: Evlyn KannerBritto, Ami Thornsberry MD, FACS Previous Signature: 04/18/2016 3:44:24 PM Version By: Evlyn KannerBritto, Shannah Conteh MD, FACS Previous Signature: 04/18/2016 9:12:03 AM Version By: Evlyn KannerBritto, Iyahna Obriant MD, FACS Entered By: Evlyn KannerBritto, Kenadie Royce on 04/18/2016 15:44:32 [name] OttoLINDLEY, Linsey [name]. ([id number]) -------------------------------------------------------------------------------- SuperBill Details Patient Name: [name] OttoLINDLEY, Keiran [name]. Date of Service: 04/18/2016 Medical Record Number: [id number] Patient Account Number: [id number] Date of Birth/Sex: 27-Jun-1946 [age] y.o. 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 Facility Procedures CPT4: Description Modifier  Quantity Code [id number] 11042 - DEB SUBQ TISSUE 20 SQ CM/< 1 ICD-10 Description Diagnosis E11.621 Type 2 diabetes mellitus with foot ulcer L89.613 Pressure ulcer of right heel, stage 3 I89.0 Lymphedema, not elsewhere classified  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 ([id number]) Electronic Signature(s) Signed: 04/18/2016 9:12:15 AM By: Evlyn KannerBritto, Jaelee Laughter MD, FACS Entered By: Evlyn KannerBritto, Shaunte Tuft on 04/18/2016 09:12:15

## 2016-04-19 NOTE — Progress Notes (Signed)
[name], Bronte V. ([id number]) Visit Report for 04/18/2016 Arrival Information Details Patient Name: [name], [name] Date of Service: 04/18/2016 8:30 AM Medical Record Number: [id number] Patient Account Number: [id number] Date of Birth/Sex: [birth year] [age] y.o. Male) Treating RN: Phillis HaggisPinkerton, Debi Primary Care Physician: Oretha MilchSMITH, [name] Other Clinician: Referring Physician: Oretha MilchSMITH, [name] Treating Physician/Extender: Rudene ReBritto, Errol Weeks in Treatment: 15 Visit Information History Since Last Visit All ordered tests and consults were completed: No Patient Arrived: Wheel Chair Added or deleted any medications: No Arrival Time: 08:28 Any new allergies or adverse reactions: No Accompanied By: self Had a fall or experienced change in No Transfer Assistance: EasyPivot activities of daily living that may affect Patient Lift risk of falls: Patient Identification Verified: Yes Signs or symptoms of abuse/neglect since last No Secondary Verification Process Yes visito Completed: Hospitalized since last visit: No Patient Requires Transmission- No Pain Present Now: No Based Precautions: Patient Has Alerts: Yes Patient Alerts: DM II Electronic Signature(s) Signed: 04/18/2016 4:02:17 PM By: Alejandro MullingPinkerton, Debra Entered By: Alejandro MullingPinkerton, Debra on 04/18/2016 08:39:17 [name], [name] ([id number]) -------------------------------------------------------------------------------- Encounter Discharge Information Details Patient Name: [name], [name] Date of Service: 04/18/2016 8:30 AM Medical Record Number: [id number] Patient Account Number: [id number] Date of Birth/Sex: [birth year] [age] y.o. Male) Treating RN: Phillis HaggisPinkerton, Debi Primary Care Physician: Oretha MilchSMITH, [name] Other Clinician: Referring Physician: Oretha MilchSMITH, [name] Treating Physician/Extender: Rudene ReBritto, Errol Weeks in Treatment: 15 Encounter Discharge Information Items Discharge Pain Level: 0 Discharge Condition: Stable Ambulatory Status: Wheelchair Discharge  Destination: Nursing Home Transportation: Other Accompanied By: self Schedule Follow-up Appointment: Yes Medication Reconciliation completed Yes and provided to Patient/Care [place] Madding: Provided on Clinical Summary of Care: 04/18/2016 Form Type Recipient Paper Patient EL Electronic Signature(s) Signed: 04/18/2016 9:37:28 AM By: Gwenlyn PerkingMoore, Shelia Entered By: Gwenlyn PerkingMoore, Shelia on 04/18/2016 09:37:27 [name], [name] ([id number]) -------------------------------------------------------------------------------- Lower Extremity Assessment Details Patient Name: [name], [name] Date of Service: 04/18/2016 8:30 AM Medical Record Number: [id number] Patient Account Number: [id number] Date of Birth/Sex: [birth year] [age] y.o. Male) Treating RN: Phillis HaggisPinkerton, Debi Primary Care Physician: Oretha MilchSMITH, [name] Other Clinician: Referring Physician: Oretha MilchSMITH, [name] Treating Physician/Extender: Rudene ReBritto, Errol Weeks in Treatment: 15 Edema Assessment Assessed: [Left: No] [Right: No] E[Left: dema] [Right: :] Calf Left: Right: Point of Measurement: 34 cm From Medial Instep 37 cm 37.2 cm Ankle Left: Right: Point of Measurement: 12 cm From Medial Instep 24.2 cm 23.6 cm Vascular Assessment Pulses: Posterior Tibial Dorsalis Pedis Palpable: [Left:Yes] [Right:Yes] Extremity colors, hair growth, and conditions: Extremity Color: [Left:Hyperpigmented] [Right:Hyperpigmented] Temperature of Extremity: [Left:Warm] [Right:Warm] Capillary Refill: [Left:< 3 seconds] [Right:< 3 seconds] Toe Nail Assessment Left: Right: Thick: Yes Yes Discolored: Yes Yes Deformed: Yes Yes Improper Length and Hygiene: No No Electronic Signature(s) Signed: 04/18/2016 4:02:17 PM By: Alejandro MullingPinkerton, Debra Entered By: Alejandro MullingPinkerton, Debra on 04/18/2016 08:44:10 [name], [name] ([id number]) -------------------------------------------------------------------------------- Multi Wound Chart Details Patient Name: [name], [name] Date of Service: 04/18/2016 8:30  AM Medical Record Number: [id number] Patient Account Number: [id number] Date of Birth/Sex: [birth year] [age] y.o. Male) Treating RN: Phillis HaggisPinkerton, Debi Primary Care Physician: Oretha MilchSMITH, [name] Other Clinician: Referring Physician: Oretha MilchSMITH, [name] Treating Physician/Extender: Rudene ReBritto, Errol Weeks in Treatment: 15 Vital Signs Height(in): 70 Pulse(bpm): 78 Weight(lbs): 235 Blood Pressure 125/75 (mmHg): Body Mass Index(BMI): 34 Temperature(F): 97.8 Respiratory Rate 18 (breaths/min): Photos: [1:No Photos] [2:No Photos] [3:No Photos] Wound Location: [1:Left Lower Leg] [2:Right Lower Leg] [3:Right Calcaneus] Wounding Event: [1:Gradually Appeared] [2:Gradually Appeared] [3:Pressure Injury] Primary Etiology: [1:Venous Leg Ulcer] [2:Venous Leg Ulcer] [3:Pressure Ulcer] Comorbid History: [1:Cataracts, Chronic Obstructive Pulmonary Disease (COPD), Sleep Disease (COPD), Sleep Disease (COPD), Sleep  Apnea, Angina, Congestive Heart Failure, Congestive Heart Failure, Congestive Heart Failure, Hypertension, Type II Diabetes,  Gout, Osteoarthritis, Neuropathy Osteoarthritis, Neuropathy Osteoarthritis, Neuropathy] [2:Cataracts, Chronic Obstructive Pulmonary Apnea, Angina, Hypertension, Type II Diabetes, Gout,] [3:Cataracts, Chronic Obstructive Pulmonary Apnea, Angina,  Hypertension, Type II Diabetes, Gout,] Date Acquired: [1:12/31/2014] [2:12/31/2014] [3:10/31/2015] Weeks of Treatment: [1:15] [2:15] [3:15] Wound Status: [1:Open] [2:Open] [3:Open] Measurements L x W x D 7.5x8x0.1 [2:0.3x0.3x0.1] [3:1.5x3x0.2] (cm) Area (cm) : [1:47.124] [2:0.071] [3:3.534] Volume (cm) : [1:4.712] [2:0.007] [3:0.707] % Reduction in Area: [1:-92.30%] [2:69.90%] [3:50.00%] % Reduction in Volume: -92.30% [2:70.80%] [3:50.00%] Classification: [1:Partial Thickness] [2:Partial Thickness] [3:Category/Stage III] HBO Classification: [1:Grade 1] [2:Grade 1] [3:Grade 1] Exudate Amount: [1:Large] [2:Medium] [3:Large] Exudate Type: [1:Serous]  [2:Serous] [3:Serous] Exudate Color: [1:amber] [2:amber] [3:amber] Wound Margin: [1:Flat and Intact] [2:Flat and Intact] [3:Flat and Intact] Granulation Amount: [1:Large (67-100%)] [2:Large (67-100%)] [3:Medium (34-66%)] Granulation Quality: [1:Pink] [2:Red] [3:Red, Pink] Necrotic Amount: [1:Small (1-33%)] [2:Small (1-33%)] [3:Medium (34-66%)] Exposed Structures: Fascia: No Fascia: No Fascia: No Fat: No Fat: No Fat: No Tendon: No Tendon: No Tendon: No Muscle: No Muscle: No Muscle: No Joint: No Joint: No Joint: No Bone: No Bone: No Bone: No Limited to Skin Limited to Skin Limited to Skin Breakdown Breakdown Breakdown Epithelialization: Medium (34-66%) Large (67-100%) None Periwound Skin Texture: Edema: Yes Edema: No Edema: Yes Periwound Skin Moist: Yes Maceration: Yes Maceration: Yes Moisture: Moist: Yes Moist: Yes Periwound Skin Color: Erythema: Yes Erythema: Yes Erythema: Yes Erythema Location: Circumferential Circumferential Circumferential Temperature: No Abnormality No Abnormality No Abnormality Tenderness on Yes Yes Yes Palpation: Wound Preparation: Ulcer Cleansing: Other: Ulcer Cleansing: Other: Ulcer Cleansing: soap and water soap and water Rinsed/Irrigated with Saline Topical Anesthetic Topical Anesthetic Applied: Other: lidocaine Applied: None Topical Anesthetic 4% Applied: Other: lidocaine 4% Wound Number: 6 7 N/A Photos: No Photos No Photos N/A Wound Location: Right Foot - Lateral Midline Coccyx N/A Wounding Event: Pressure Injury Pressure Injury N/A Primary Etiology: Pressure Ulcer Pressure Ulcer N/A Comorbid History: Cataracts, Chronic N/A N/A Obstructive Pulmonary Disease (COPD), Sleep Apnea, Angina, Congestive Heart Failure, Hypertension, Type II Diabetes, Gout, Osteoarthritis, Neuropathy Date Acquired: 02/16/2016 02/23/2016 N/A Weeks of Treatment: 8 7 N/A Wound Status: Open Healed - Epithelialized N/A Measurements L x W x D 0.3x0.3x0.1  0x0x0 N/A (cm) Area (cm) : 0.071 0 N/A Volume (cm) : 0.007 0 N/A % Reduction in Area: 54.80% 100.00% N/A % Reduction in Volume: 56.30% 100.00% N/A Classification: Category/Stage II Category/Stage II N/A HBO Classification: Grade 1 N/A N/A Exudate Amount: Large N/A N/A [name], [name] ([id number]) Exudate Type: Serosanguineous N/A N/A Exudate Color: red, brown N/A N/A Wound Margin: Flat and Intact N/A N/A Granulation Amount: Small (1-33%) N/A N/A Granulation Quality: Pink N/A N/A Necrotic Amount: Large (67-100%) N/A N/A Exposed Structures: Fascia: No N/A N/A Fat: No Tendon: No Muscle: No Joint: No Bone: No Limited to Skin Breakdown Epithelialization: None N/A N/A Periwound Skin Texture: No Abnormalities Noted No Abnormalities Noted N/A Periwound Skin Moist: Yes No Abnormalities Noted N/A Moisture: Periwound Skin Color: No Abnormalities Noted No Abnormalities Noted N/A Erythema Location: N/A N/A N/A Temperature: No Abnormality N/A N/A Tenderness on Yes No N/A Palpation: Wound Preparation: Ulcer Cleansing: N/A N/A Rinsed/Irrigated with Saline Topical Anesthetic Applied: Other: lidocaine 4% Treatment Notes Electronic Signature(s) Signed: 04/18/2016 4:02:17 PM By: Alejandro Mulling Entered By: Alejandro Mulling on 04/18/2016 09:00:37 [name] ([id number]) -------------------------------------------------------------------------------- Multi-Disciplinary Care Plan Details Patient Name: [name] Date of Service: 04/18/2016 8:30 AM Medical Record Number: [id number] Patient Account Number: [id number]  Date of Birth/Sex: [birth year] (70 y.o. Male) Treating RN: Ashok Cordia, Debi Primary Care Physician: Oretha Milch Other Clinician: Referring Physician: Oretha Milch Treating Physician/Extender: Rudene Re in Treatment: 15 Active Inactive Abuse / Safety / Falls / Self Care Management Nursing Diagnoses: Potential for falls Goals: Patient will remain  injury free Date Initiated: 03/01/2016 Goal Status: Active Interventions: Assess fall risk on admission and as needed Notes: Nutrition Nursing Diagnoses: Imbalanced nutrition Goals: Patient/caregiver agrees to and verbalizes understanding of need to use nutritional supplements and/or vitamins as prescribed Date Initiated: 03/01/2016 Goal Status: Active Interventions: Assess patient nutrition upon admission and as needed per policy Notes: Orientation to the Wound Care Program Nursing Diagnoses: Knowledge deficit related to the wound healing center program Goals: Patient/caregiver will verbalize understanding of the [facility]aling Center [address] CEM, KOSMAN ([id number]) Date Initiated: 03/01/2016 Goal Status: Active Interventions: Provide education on orientation to the wound center Notes: Pain, Acute or Chronic Nursing Diagnoses: Pain, acute or chronic: actual or potential Potential alteration in comfort, pain Goals: Patient will verbalize adequate pain control and receive pain control interventions during procedures as needed Date Initiated: 03/01/2016 Goal Status: Active Patient/caregiver will verbalize adequate pain control between visits Date Initiated: 03/01/2016 Goal Status: Active Interventions: Assess comfort goal upon admission Complete pain assessment as per visit requirements Notes: Pressure Nursing Diagnoses: Knowledge deficit related to causes and risk factors for pressure ulcer development Knowledge deficit related to management of pressures ulcers Goals: Patient/caregiver will verbalize risk factors for pressure ulcer development Date Initiated: 03/01/2016 Goal Status: Active Interventions: Assess offloading mechanisms upon admission and as needed Notes: Wound/Skin Impairment Nursing Diagnoses: JOREN, REHM ([id number]) Impaired tissue integrity Goals: Ulcer/skin breakdown will have a volume reduction of 30% by week 4 Date Initiated:  03/01/2016 Goal Status: Active Ulcer/skin breakdown will have a volume reduction of 50% by week 8 Date Initiated: 03/01/2016 Goal Status: Active Ulcer/skin breakdown will have a volume reduction of 80% by week 12 Date Initiated: 03/01/2016 Goal Status: Active Interventions: Assess ulceration(s) every visit Notes: Electronic Signature(s) Signed: 04/18/2016 4:02:17 PM By: Alejandro Mulling Entered By: Alejandro Mulling on 04/18/2016 08:59:34 [name], [name] ([id number]) -------------------------------------------------------------------------------- Pain Assessment Details Patient Name: [name] Date of Service: 04/18/2016 8:30 AM Medical Record Number: [id number] Patient Account Number: [id number] Date of Birth/Sex: 07-27-1946 (70 y.o. Male) Treating RN: Phillis Haggis Primary Care Physician: Oretha Milch Other Clinician: Referring Physician: Oretha Milch Treating Physician/Extender: Rudene Re in Treatment: 15 Active Problems Location of Pain Severity and Description of Pain Patient Has Paino No Site Locations Pain Management and Medication Current Pain Management: Electronic Signature(s) Signed: 04/18/2016 4:02:17 PM By: Alejandro Mulling Entered By: Alejandro Mulling on 04/18/2016 08:39:23 [name] ([id number]) -------------------------------------------------------------------------------- Patient/Caregiver Education Details Patient Name: [name] Date of Service: 04/18/2016 8:30 AM Medical Record Number: [id number] Patient Account Number: [id number] Date of Birth/Gender: January 19, 1946 (70 y.o. Male) Treating RN: Phillis Haggis Primary Care Physician: Oretha Milch Other Clinician: Referring Physician: Oretha Milch Treating Physician/Extender: Rudene Re in Treatment: 15 Education Assessment Education Provided To: Patient Education Topics Provided Wound/Skin Impairment: Handouts: Other: do not get wraps wet, change heel dressing as  ordered Methods: Demonstration, Explain/Verbal Responses: State content correctly Electronic Signature(s) Signed: 04/18/2016 4:02:17 PM By: Alejandro Mulling Entered By: Alejandro Mulling on 04/18/2016 09:11:54 Diluzio, Chananya Seth Bake ([id number]) -------------------------------------------------------------------------------- Wound Assessment Details Patient Name: [name] Date of Service: 04/18/2016 8:30 AM Medical Record Number: [id number] Patient Account Number: [id number] Date of Birth/Sex: 11-24-1945 (70 y.o. Male) Treating RN: Ashok Cordia,  Debi Primary Care Physician: Oretha Milch Other Clinician: Referring Physician: Oretha Milch Treating Physician/Extender: Rudene Re in Treatment: 15 Wound Status Wound Number: 1 Primary Venous Leg Ulcer Etiology: Wound Location: Left Lower Leg Wound Open Wounding Event: Gradually Appeared Status: Date Acquired: 12/31/2014 Comorbid Cataracts, Chronic Obstructive Weeks Of Treatment: 15 History: Pulmonary Disease (COPD), Sleep Clustered Wound: No Apnea, Angina, Congestive Heart Failure, Hypertension, Type II Diabetes, Gout, Osteoarthritis, Neuropathy Photos Photo Uploaded By: Alejandro Mulling on 04/18/2016 16:00:06 Wound Measurements Length: (cm) 7.5 Width: (cm) 8 Depth: (cm) 0.1 Area: (cm) 47.124 Volume: (cm) 4.712 % Reduction in Area: -92.3% % Reduction in Volume: -92.3% Epithelialization: Medium (34-66%) Tunneling: No Undermining: No Wound Description Classification: Partial Thickness Foul Odor Af Diabetic Severity [address]): Grade 1 Wound Margin: Flat and Intact Exudate Amount: Large Exudate Type: Serous Exudate Color: amber ter Cleansing: No Wound Bed Granulation Amount: Large (67-100%) Exposed Structure Boakye, Ashanti V. ([id number]) Granulation Quality: Pink Fascia Exposed: No Necrotic Amount: Small (1-33%) Fat Layer Exposed: No Necrotic Quality: Adherent Slough Tendon Exposed: No Muscle Exposed: No Joint  Exposed: No Bone Exposed: No Limited to Skin Breakdown Periwound Skin Texture Texture Color No Abnormalities Noted: No No Abnormalities Noted: No Localized Edema: Yes Erythema: Yes Erythema Location: Circumferential Moisture No Abnormalities Noted: No Temperature / Pain Moist: Yes Temperature: No Abnormality Tenderness on Palpation: Yes Wound Preparation Ulcer Cleansing: Other: soap and water, Topical Anesthetic Applied: Other: lidocaine 4%, Treatment Notes Wound #1 (Left Lower Leg) 1. Cleansed with: Cleanse wound with antibacterial soap and water 2. Anesthetic Topical Lidocaine 4% cream to wound bed prior to debridement 4. Dressing Applied: Aquacel Ag 5. Secondary Dressing Applied ABD Pad 7. Secured with Tape 2 Layer Lite Compression System - Bilateral Notes kerlix and coban Electronic Signature(s) Signed: 04/18/2016 4:02:17 PM By: Alejandro Mulling Entered By: Alejandro Mulling on 04/18/2016 08:57:12 Douthitt, Maricus V. ([id number]) -------------------------------------------------------------------------------- Wound Assessment Details Patient Name: [name] Date of Service: 04/18/2016 8:30 AM Medical Record Number: [id number] Patient Account Number: [id number] Date of Birth/Sex: July 07, 1946 (70 y.o. Male) Treating RN: Phillis Haggis Primary Care Physician: Oretha Milch Other Clinician: Referring Physician: Oretha Milch Treating Physician/Extender: Rudene Re in Treatment: 15 Wound Status Wound Number: 2 Primary Venous Leg Ulcer Etiology: Wound Location: Right Lower Leg Wound Open Wounding Event: Gradually Appeared Status: Date Acquired: 12/31/2014 Comorbid Cataracts, Chronic Obstructive Weeks Of Treatment: 15 History: Pulmonary Disease (COPD), Sleep Clustered Wound: No Apnea, Angina, Congestive Heart Failure, Hypertension, Type II Diabetes, Gout, Osteoarthritis, Neuropathy Photos Photo Uploaded By: Alejandro Mulling on 04/18/2016  16:00:06 Wound Measurements Length: (cm) 0.3 Width: (cm) 0.3 Depth: (cm) 0.1 Area: (cm) 0.071 Volume: (cm) 0.007 % Reduction in Area: 69.9% % Reduction in Volume: 70.8% Epithelialization: Large (67-100%) Tunneling: No Undermining: No Wound Description Classification: Partial Thickness Foul Odor Af Diabetic Severity [address]): Grade 1 Wound Margin: Flat and Intact Exudate Amount: Medium Exudate Type: Serous Exudate Color: amber ter Cleansing: No Wound Bed Granulation Amount: Large (67-100%) Exposed Structure Chisom, Harshith V. ([id number]) Granulation Quality: Red Fascia Exposed: No Necrotic Amount: Small (1-33%) Fat Layer Exposed: No Necrotic Quality: Adherent Slough Tendon Exposed: No Muscle Exposed: No Joint Exposed: No Bone Exposed: No Limited to Skin Breakdown Periwound Skin Texture Texture Color No Abnormalities Noted: No No Abnormalities Noted: No Localized Edema: No Erythema: Yes Erythema Location: Circumferential Moisture No Abnormalities Noted: No Temperature / Pain Maceration: Yes Temperature: No Abnormality Moist: Yes Tenderness on Palpation: Yes Wound Preparation Ulcer Cleansing: Other: soap and water, Topical Anesthetic Applied: None Treatment Notes  Wound #2 (Right Lower Leg) 1. Cleansed with: Cleanse wound with antibacterial soap and water 2. Anesthetic Topical Lidocaine 4% cream to wound bed prior to debridement 4. Dressing Applied: Aquacel Ag 5. Secondary Dressing Applied ABD Pad 7. Secured with Tape 2 Layer Lite Compression System - Bilateral Notes kerlix and coban Electronic Signature(s) Signed: 04/18/2016 4:02:17 PM By: Alejandro Mulling Entered By: Alejandro Mulling on 04/18/2016 08:57:40 Battin, Amarius V. ([id number]) -------------------------------------------------------------------------------- Wound Assessment Details Patient Name: [name] Date of Service: 04/18/2016 8:30 AM Medical Record Number: [id number] Patient  Account Number: [id number] Date of Birth/Sex: [birth year] (70 y.o. Male) Treating RN: Phillis Haggis Primary Care Physician: Oretha Milch Other Clinician: Referring Physician: Oretha Milch Treating Physician/Extender: Rudene Re in Treatment: 15 Wound Status Wound Number: 3 Primary Pressure Ulcer Etiology: Wound Location: Right Calcaneus Wound Open Wounding Event: Pressure Injury Status: Date Acquired: 10/31/2015 Comorbid Cataracts, Chronic Obstructive Weeks Of Treatment: 15 History: Pulmonary Disease (COPD), Sleep Clustered Wound: No Apnea, Angina, Congestive Heart Failure, Hypertension, Type II Diabetes, Gout, Osteoarthritis, Neuropathy Photos Photo Uploaded By: Alejandro Mulling on 04/18/2016 16:00:29 Wound Measurements Length: (cm) 1.5 Width: (cm) 3 Depth: (cm) 0.2 Area: (cm) 3.534 Volume: (cm) 0.707 % Reduction in Area: 50% % Reduction in Volume: 50% Epithelialization: None Tunneling: No Undermining: No Wound Description Classification: Category/Stage III Foul Odor A Diabetic Severity (Wagner): Grade 1 Wound Margin: Flat and Intact Exudate Amount: Large Exudate Type: Serous Exudate Color: amber fter Cleansing: No Wound Bed Granulation Amount: Medium (34-66%) Exposed Structure Larzelere, Quinto V. ([id number]) Granulation Quality: Red, Pink Fascia Exposed: No Necrotic Amount: Medium (34-66%) Fat Layer Exposed: No Necrotic Quality: Adherent Slough Tendon Exposed: No Muscle Exposed: No Joint Exposed: No Bone Exposed: No Limited to Skin Breakdown Periwound Skin Texture Texture Color No Abnormalities Noted: No No Abnormalities Noted: No Localized Edema: Yes Erythema: Yes Erythema Location: Circumferential Moisture No Abnormalities Noted: No Temperature / Pain Maceration: Yes Temperature: No Abnormality Moist: Yes Tenderness on Palpation: Yes Wound Preparation Ulcer Cleansing: Rinsed/Irrigated with Saline Topical Anesthetic Applied: Other:  lidocaine 4%, Treatment Notes Wound #3 (Right Calcaneus) 1. Cleansed with: Cleanse wound with antibacterial soap and water 2. Anesthetic Topical Lidocaine 4% cream to wound bed prior to debridement 3. Peri-wound Care: Barrier cream 4. Dressing Applied: Aquacel Ag 5. Secondary Dressing Applied ABD Pad Kerlix/Conform 7. Secured with Secretary/administrator) Signed: 04/18/2016 4:02:17 PM By: Alejandro Mulling Entered By: Alejandro Mulling on 04/18/2016 08:54:42 [name] ([id number]) -------------------------------------------------------------------------------- Wound Assessment Details Patient Name: [name] Date of Service: 04/18/2016 8:30 AM Medical Record Number: [id number] Patient Account Number: [id number] Date of Birth/Sex: Jul 07, 1946 (70 y.o. Male) Treating RN: Phillis Haggis Primary Care Physician: Oretha Milch Other Clinician: Referring Physician: Oretha Milch Treating Physician/Extender: Rudene Re in Treatment: 15 Wound Status Wound Number: 6 Primary Pressure Ulcer Etiology: Wound Location: Right Foot - Lateral Wound Open Wounding Event: Pressure Injury Status: Date Acquired: 02/16/2016 Comorbid Cataracts, Chronic Obstructive Weeks Of Treatment: 8 History: Pulmonary Disease (COPD), Sleep Clustered Wound: No Apnea, Angina, Congestive Heart Failure, Hypertension, Type II Diabetes, Gout, Osteoarthritis, Neuropathy Photos Photo Uploaded By: Alejandro Mulling on 04/18/2016 16:00:29 Wound Measurements Length: (cm) 0.3 Width: (cm) 0.3 Depth: (cm) 0.1 Area: (cm) 0.071 Volume: (cm) 0.007 % Reduction in Area: 54.8% % Reduction in Volume: 56.3% Epithelialization: None Tunneling: No Undermining: No Wound Description Classification: Category/Stage II Foul Odor Af Diabetic Severity (Wagner): Grade 1 Wound Margin: Flat and Intact Exudate Amount: Large Exudate Type: Serosanguineous Exudate Color: red, brown ter Cleansing: No Wound  Bed Granulation Amount: Small (1-33%) Exposed Structure Cumbie, Stepen V. ([id number]) Granulation Quality: Pink Fascia Exposed: No Necrotic Amount: Large (67-100%) Fat Layer Exposed: No Necrotic Quality: Adherent Slough Tendon Exposed: No Muscle Exposed: No Joint Exposed: No Bone Exposed: No Limited to Skin Breakdown Periwound Skin Texture Texture Color No Abnormalities Noted: No No Abnormalities Noted: No Moisture Temperature / Pain No Abnormalities Noted: No Temperature: No Abnormality Moist: Yes Tenderness on Palpation: Yes Wound Preparation Ulcer Cleansing: Rinsed/Irrigated with Saline Topical Anesthetic Applied: Other: lidocaine 4%, Treatment Notes Wound #6 (Right, Lateral Foot) 1. Cleansed with: Cleanse wound with antibacterial soap and water 2. Anesthetic Topical Lidocaine 4% cream to wound bed prior to debridement 3. Peri-wound Care: Barrier cream 4. Dressing Applied: Aquacel Ag 5. Secondary Dressing Applied ABD Pad Notes under to 2- layer kerlix and coban wrap Electronic Signature(s) Signed: 04/18/2016 4:02:17 PM By: Alejandro Mulling Entered By: Alejandro Mulling on 04/18/2016 08:55:43 Clonch, Xue V. ([id number]) -------------------------------------------------------------------------------- Wound Assessment Details Patient Name: [name] Date of Service: 04/18/2016 8:30 AM Medical Record Number: [id number] Patient Account Number: [id number] Date of Birth/Sex: August 14, 1946 (70 y.o. Male) Treating RN: Phillis Haggis Primary Care Physician: Oretha Milch Other Clinician: Referring Physician: Oretha Milch Treating Physician/Extender: Rudene Re in Treatment: 15 Wound Status Wound Number: 7 Primary Etiology: Pressure Ulcer Wound Location: Midline Coccyx Wound Status: Healed - Epithelialized Wounding Event: Pressure Injury Date Acquired: 02/23/2016 Weeks Of Treatment: 7 Clustered Wound: No Photos Photo Uploaded By: Alejandro Mulling on  04/18/2016 16:00:35 Wound Measurements Length: (cm) 0 % Reduction in Width: (cm) 0 % Reduction in Depth: (cm) 0 Area: (cm) 0 Volume: (cm) 0 Area: 100% Volume: 100% Wound Description Classification: Category/Stage II Periwound Skin Texture Texture Color No Abnormalities Noted: No No Abnormalities Noted: No Moisture No Abnormalities Noted: No Electronic Signature(s) Signed: 04/18/2016 4:02:17 PM By: Evette Georges, Quillan V. ([id number]) Entered By: Alejandro Mulling on 04/18/2016 08:55:00 [name] ([id number]) -------------------------------------------------------------------------------- Vitals Details Patient Name: [name] Date of Service: 04/18/2016 8:30 AM Medical Record Number: [id number] Patient Account Number: [id number] Date of Birth/Sex: 1946/09/27 (70 y.o. Male) Treating RN: Phillis Haggis Primary Care Physician: Oretha Milch Other Clinician: Referring Physician: Oretha Milch Treating Physician/Extender: Rudene Re in Treatment: 15 Vital Signs Time Taken: 08:39 Temperature (F): 97.8 Height (in): 70 Pulse (bpm): 78 Weight (lbs): 235 Respiratory Rate (breaths/min): 18 Body Mass Index (BMI): 33.7 Blood Pressure (mmHg): 125/75 Reference Range: 80 - 120 mg / dl Electronic Signature(s) Signed: 04/18/2016 4:02:17 PM By: Alejandro Mulling Entered By: Alejandro Mulling on 04/18/2016 08:39:41

## 2016-04-25 ENCOUNTER — Encounter: Payer: Medicare Other | Admitting: Surgery

## 2016-04-25 DIAGNOSIS — E11621 Type 2 diabetes mellitus with foot ulcer: Secondary | ICD-10-CM | POA: Diagnosis not present

## 2016-04-26 NOTE — Progress Notes (Signed)
[name], Andreas V. ([id number]) Visit Report for 04/25/2016 Arrival Information Details Patient Name: [name], [name] Date of Service: 04/25/2016 8:30 AM Medical Record Number: [id number] Patient Account Number: [id number] Date of Birth/Sex: 11-10-1946 [age] y.o. Male) Treating RN: Phillis HaggisPinkerton, Debi Primary Care Physician: Oretha MilchSMITH, [name] Other Clinician: Referring Physician: Oretha MilchSMITH, [name] Treating Physician/Extender: Rudene ReBritto, Errol Weeks in Treatment: 16 Visit Information History Since Last Visit All ordered tests and consults were completed: No Patient Arrived: Wheel Chair Added or deleted any medications: No Arrival Time: 08:38 Any new allergies or adverse reactions: No Accompanied By: self Had a fall or experienced change in No Transfer Assistance: EasyPivot activities of daily living that may affect Patient Lift risk of falls: Patient Identification Verified: Yes Signs or symptoms of abuse/neglect since last No Secondary Verification Process Yes visito Completed: Hospitalized since last visit: No Patient Requires Transmission- No Pain Present Now: No Based Precautions: Patient Has Alerts: Yes Patient Alerts: DM II Electronic Signature(s) Signed: 04/25/2016 4:48:24 PM By: Alejandro MullingPinkerton, Debra Entered By: Alejandro MullingPinkerton, Debra on 04/25/2016 08:39:33 Harmon, Mickeal Seth BakeV. ([id number]) -------------------------------------------------------------------------------- Clinic Level of Care Assessment Details Patient Name: [name], [name] Date of Service: 04/25/2016 8:30 AM Medical Record Number: [id number] Patient Account Number: [id number] Date of Birth/Sex: 11-10-1946 [age] y.o. Male) Treating RN: Phillis HaggisPinkerton, Debi Primary Care Physician: Oretha MilchSMITH, [name] Other Clinician: Referring Physician: Oretha MilchSMITH, [name] Treating Physician/Extender: Rudene ReBritto, Errol Weeks in Treatment: 16 Clinic Level of Care Assessment Items TOOL 4 Quantity Score X - Use when only an EandM is performed on FOLLOW-UP visit 1 0 ASSESSMENTS  - Nursing Assessment / Reassessment X - Reassessment of Co-morbidities (includes updates in patient status) 1 10 X - Reassessment of Adherence to Treatment Plan 1 5 ASSESSMENTS - Wound and Skin Assessment / Reassessment []  - Simple Wound Assessment / Reassessment - one wound 0 X - Complex Wound Assessment / Reassessment - multiple wounds 4 5 []  - Dermatologic / Skin Assessment (not related to wound area) 0 ASSESSMENTS - Focused Assessment X - Circumferential Edema Measurements - multi extremities 2 5 []  - Nutritional Assessment / Counseling / Intervention 0 []  - Lower Extremity Assessment (monofilament, tuning fork, pulses) 0 []  - Peripheral Arterial Disease Assessment (using hand held doppler) 0 ASSESSMENTS - Ostomy and/or Continence Assessment and Care []  - Incontinence Assessment and Management 0 []  - Ostomy Care Assessment and Management (repouching, etc.) 0 PROCESS - Coordination of Care []  - Simple Patient / Family Education for ongoing care 0 X - Complex (extensive) Patient / Family Education for ongoing care 1 20 X - Staff obtains ChiropractorConsents, Records, Test Results / Process Orders 1 10 X - Staff telephones HHA, Nursing Homes / Clarify orders / etc 1 10 []  - Routine Transfer to another Facility (non-emergent condition) 0 Stemm, Sheron V. ([id number]) []  - Routine Hospital Admission (non-emergent condition) 0 []  - New Admissions / Manufacturing engineernsurance Authorizations / Ordering NPWT, Apligraf, etc. 0 []  - Emergency Hospital Admission (emergent condition) 0 X - Simple Discharge Coordination 1 10 []  - Complex (extensive) Discharge Coordination 0 PROCESS - Special Needs []  - Pediatric / Minor Patient Management 0 []  - Isolation Patient Management 0 []  - Hearing / Language / Visual special needs 0 []  - Assessment of Community assistance (transportation, D/C planning, etc.) 0 []  - Additional assistance / Altered mentation 0 []  - Support Surface(s) Assessment (bed, cushion, seat, etc.)  0 INTERVENTIONS - Wound Cleansing / Measurement []  - Simple Wound Cleansing - one wound 0 X - Complex Wound Cleansing - multiple wounds 4 5 X -  Wound Imaging (photographs - any number of wounds) 1 5 []  - Wound Tracing (instead of photographs) 0 []  - Simple Wound Measurement - one wound 0 X - Complex Wound Measurement - multiple wounds 4 5 INTERVENTIONS - Wound Dressings X - Small Wound Dressing one or multiple wounds 2 10 X - Medium Wound Dressing one or multiple wounds 2 15 []  - Large Wound Dressing one or multiple wounds 0 []  - Application of Medications - topical 0 []  - Application of Medications - injection 0 INTERVENTIONS - Miscellaneous []  - External ear exam 0 Fountaine, [name] ([id number]) []  - Specimen Collection (cultures, biopsies, blood, body fluids, etc.) 0 []  - Specimen(s) / Culture(s) sent or taken to Lab for analysis 0 []  - Patient Transfer (multiple staff / Michiel Sites Lift / Similar devices) 0 []  - Simple Staple / Suture removal (25 or less) 0 []  - Complex Staple / Suture removal (26 or more) 0 []  - Hypo / Hyperglycemic Management (close monitor of Blood Glucose) 0 []  - Ankle / Brachial Index (ABI) - do not check if billed separately 0 X - Vital Signs 1 5 Has the patient been seen at the hospital within the last three years: Yes Total Score: 195 Level Of Care: New/Established - Level 5 Electronic Signature(s) Signed: 04/25/2016 4:48:24 PM By: Alejandro Mulling Entered By: Alejandro Mulling on 04/25/2016 10:32:51 [name] ([id number]) -------------------------------------------------------------------------------- Encounter Discharge Information Details Patient Name: [name] Date of Service: 04/25/2016 8:30 AM Medical Record Number: [id number] Patient Account Number: [id number] Date of Birth/Sex: 1946-04-21 (70 y.o. Male) Treating RN: Phillis Haggis Primary Care Physician: Oretha Milch Other Clinician: Referring Physician: Oretha Milch Treating  Physician/Extender: Rudene Re in Treatment: 26 Encounter Discharge Information Items Discharge Pain Level: 0 Discharge Condition: Stable Ambulatory Status: Wheelchair Discharge Destination: Nursing Home Transportation: Other Accompanied By: self Schedule Follow-up Appointment: Yes Medication Reconciliation completed and provided to Patient/Care Yes Shandell Giovanni: Provided on Clinical Summary of Care: 04/25/2016 Form Type Recipient Paper Patient EL Electronic Signature(s) Signed: 04/25/2016 9:42:43 AM By: Gwenlyn Perking Entered By: Gwenlyn Perking on 04/25/2016 09:42:43 Christo, Drayven Seth Bake ([id number]) -------------------------------------------------------------------------------- Lower Extremity Assessment Details Patient Name: [name] Date of Service: 04/25/2016 8:30 AM Medical Record Number: [id number] Patient Account Number: [id number] Date of Birth/Sex: [birth year] (70 y.o. Male) Treating RN: Phillis Haggis Primary Care Physician: Oretha Milch Other Clinician: Referring Physician: Oretha Milch Treating Physician/Extender: Rudene Re in Treatment: 16 Edema Assessment Assessed: [Left: No] [Right: No] E[Left: dema] [Right: :] Calf Left: Right: Point of Measurement: 34 cm From Medial Instep 36.8 cm 37 cm Ankle Left: Right: Point of Measurement: 12 cm From Medial Instep 24.2 cm 23.4 cm Vascular Assessment Pulses: Posterior Tibial Dorsalis Pedis Palpable: [Left:Yes] [Right:Yes] Extremity colors, hair growth, and conditions: Extremity Color: [Left:Hyperpigmented] [Right:Hyperpigmented] Temperature of Extremity: [Left:Warm] [Right:Warm] Toe Nail Assessment Left: Right: Thick: Yes Yes Discolored: Yes Yes Deformed: Yes Yes Improper Length and Hygiene: No No Electronic Signature(s) Signed: 04/25/2016 4:48:24 PM By: Alejandro Mulling Entered By: Alejandro Mulling on 04/25/2016 08:55:11 Cura, Zeppelin Seth Bake  ([id number]) -------------------------------------------------------------------------------- Multi Wound Chart Details Patient Name: [name] Date of Service: 04/25/2016 8:30 AM Medical Record Number: [id number] Patient Account Number: [id number] Date of Birth/Sex: Sep 12, 1946 (70 y.o. Male) Treating RN: Phillis Haggis Primary Care Physician: Oretha Milch Other Clinician: Referring Physician: Oretha Milch Treating Physician/Extender: Rudene Re in Treatment: 16 Vital Signs Height(in): 70 Pulse(bpm): 66 Weight(lbs): 235 Blood Pressure 130/72 (mmHg): Body Mass Index(BMI): 34 Temperature(F): 97.9 Respiratory Rate 18 (breaths/min): Photos: [  1:No Photos] [2:No Photos] [3:No Photos] Wound Location: [1:Left Lower Leg] [2:Right Lower Leg] [3:Right Calcaneus] Wounding Event: [1:Gradually Appeared] [2:Gradually Appeared] [3:Pressure Injury] Primary Etiology: [1:Venous Leg Ulcer] [2:Venous Leg Ulcer] [3:Pressure Ulcer] Comorbid History: [1:Cataracts, Chronic Obstructive Pulmonary Disease (COPD), Sleep Disease (COPD), Sleep Disease (COPD), Sleep Apnea, Angina, Congestive Heart Failure, Congestive Heart Failure, Congestive Heart Failure, Hypertension, Type II Diabetes,  Gout, Osteoarthritis, Neuropathy Osteoarthritis, Neuropathy Osteoarthritis, Neuropathy] [2:Cataracts, Chronic Obstructive Pulmonary Apnea, Angina, Hypertension, Type II Diabetes, Gout,] [3:Cataracts, Chronic Obstructive Pulmonary Apnea, Angina,  Hypertension, Type II Diabetes, Gout,] Date Acquired: [1:12/31/2014] [2:12/31/2014] [3:10/31/2015] Weeks of Treatment: [1:16] [2:16] [3:16] Wound Status: [1:Open] [2:Open] [3:Open] Measurements L x W x D 7.5x7x0.1 [2:3x4x0.1] [3:1.7x3x0.2] (cm) Area (cm) : [1:41.233] [2:9.425] [3:4.006] Volume (cm) : [1:4.123] [2:0.942] [3:0.801] % Reduction in Area: [1:-68.30%] [2:-3893.60%] [3:43.30%] % Reduction in Volume: -68.30% [2:-3825.00%] [3:43.40%] Classification:  [1:Partial Thickness] [2:Partial Thickness] [3:Category/Stage III] HBO Classification: [1:Grade 1] [2:Grade 1] [3:Grade 1] Exudate Amount: [1:Large] [2:Medium] [3:Large] Exudate Type: [1:Serosanguineous] [2:Serous] [3:Serous] Exudate Color: [1:red, brown] [2:amber] [3:amber] Wound Margin: [1:Flat and Intact] [2:Flat and Intact] [3:Flat and Intact] Granulation Amount: [1:Large (67-100%)] [2:Large (67-100%)] [3:Small (1-33%)] Granulation Quality: [1:Pink] [2:Red] [3:Red, Pink] Necrotic Amount: [1:Small (1-33%)] [2:Small (1-33%)] [3:Large (67-100%)] Exposed Structures: Fascia: No Fascia: No Fascia: No Fat: No Fat: No Fat: No Tendon: No Tendon: No Tendon: No Muscle: No Muscle: No Muscle: No Joint: No Joint: No Joint: No Bone: No Bone: No Bone: No Limited to Skin Limited to Skin Limited to Skin Breakdown Breakdown Breakdown Epithelialization: Medium (34-66%) Large (67-100%) None Periwound Skin Texture: Edema: Yes Edema: No Edema: Yes Periwound Skin Moist: Yes Maceration: Yes Maceration: Yes Moisture: Moist: Yes Moist: Yes Periwound Skin Color: Erythema: Yes Erythema: Yes Erythema: Yes Erythema Location: Circumferential Circumferential Circumferential Temperature: No Abnormality No Abnormality No Abnormality Tenderness on Yes Yes Yes Palpation: Wound Preparation: Ulcer Cleansing: Other: Ulcer Cleansing: Other: Ulcer Cleansing: soap and water soap and water Rinsed/Irrigated with Saline Topical Anesthetic Topical Anesthetic Applied: Other: lidocaine Applied: None Topical Anesthetic 4% Applied: Other: lidocaine 4% Wound Number: 6 N/A N/A Photos: No Photos N/A N/A Wound Location: Right Foot - Lateral N/A N/A Wounding Event: Pressure Injury N/A N/A Primary Etiology: Pressure Ulcer N/A N/A Comorbid History: Cataracts, Chronic N/A N/A Obstructive Pulmonary Disease (COPD), Sleep Apnea, Angina, Congestive Heart Failure, Hypertension, Type II Diabetes,  Gout, Osteoarthritis, Neuropathy Date Acquired: 02/16/2016 N/A N/A Weeks of Treatment: 9 N/A N/A Wound Status: Open N/A N/A Measurements L x W x D 0.4x0.4x0.1 N/A N/A (cm) Area (cm) : 0.126 N/A N/A Volume (cm) : 0.013 N/A N/A % Reduction in Area: 19.70% N/A N/A % Reduction in Volume: 18.80% N/A N/A Classification: Category/Stage II N/A N/A HBO Classification: Grade 1 N/A N/A Exudate Amount: Large N/A N/A Curci, Dmitri V. ([id number]) Exudate Type: Serous N/A N/A Exudate Color: amber N/A N/A Wound Margin: Flat and Intact N/A N/A Granulation Amount: Small (1-33%) N/A N/A Granulation Quality: Pink N/A N/A Necrotic Amount: Large (67-100%) N/A N/A Exposed Structures: Fascia: No N/A N/A Fat: No Tendon: No Muscle: No Joint: No Bone: No Limited to Skin Breakdown Epithelialization: None N/A N/A Periwound Skin Texture: No Abnormalities Noted N/A N/A Periwound Skin Moist: Yes N/A N/A Moisture: Periwound Skin Color: No Abnormalities Noted N/A N/A Erythema Location: N/A N/A N/A Temperature: No Abnormality N/A N/A Tenderness on Yes N/A N/A Palpation: Wound Preparation: Ulcer Cleansing: N/A N/A Rinsed/Irrigated with Saline Topical Anesthetic Applied: Other: lidocaine 4% Treatment Notes Electronic Signature(s) Signed: 04/25/2016 4:48:24 PM By: Ashok Cordia,  Debra Entered By: Alejandro Mulling on 04/25/2016 09:00:14 [name] ([id number]) -------------------------------------------------------------------------------- Multi-Disciplinary Care Plan Details Patient Name: [name] Date of Service: 04/25/2016 8:30 AM Medical Record Number: [id number] Patient Account Number: [id number] Date of Birth/Sex: [birth year] (70 y.o. Male) Treating RN: Phillis Haggis Primary Care Physician: Oretha Milch Other Clinician: Referring Physician: Oretha Milch Treating Physician/Extender: Rudene Re in Treatment: 33 Active Inactive Abuse / Safety / Falls / Self Care  Management Nursing Diagnoses: Potential for falls Goals: Patient will remain injury free Date Initiated: 03/01/2016 Goal Status: Active Interventions: Assess fall risk on admission and as needed Notes: Nutrition Nursing Diagnoses: Imbalanced nutrition Goals: Patient/caregiver agrees to and verbalizes understanding of need to use nutritional supplements and/or vitamins as prescribed Date Initiated: 03/01/2016 Goal Status: Active Interventions: Assess patient nutrition upon admission and as needed per policy Notes: Orientation to the Wound Care Program Nursing Diagnoses: Knowledge deficit related to the wound healing center program Goals: Patient/caregiver will verbalize understanding of the [facility] Center [address] TRYSTEN, BERNARD ([id number]) Date Initiated: 03/01/2016 Goal Status: Active Interventions: Provide education on orientation to the wound center Notes: Pain, Acute or Chronic Nursing Diagnoses: Pain, acute or chronic: actual or potential Potential alteration in comfort, pain Goals: Patient will verbalize adequate pain control and receive pain control interventions during procedures as needed Date Initiated: 03/01/2016 Goal Status: Active Patient/caregiver will verbalize adequate pain control between visits Date Initiated: 03/01/2016 Goal Status: Active Interventions: Assess comfort goal upon admission Complete pain assessment as per visit requirements Notes: Pressure Nursing Diagnoses: Knowledge deficit related to causes and risk factors for pressure ulcer development Knowledge deficit related to management of pressures ulcers Goals: Patient/caregiver will verbalize risk factors for pressure ulcer development Date Initiated: 03/01/2016 Goal Status: Active Interventions: Assess offloading mechanisms upon admission and as needed Notes: Wound/Skin Impairment Nursing Diagnoses: AADIL, SUR ([id number]) Impaired tissue  integrity Goals: Ulcer/skin breakdown will have a volume reduction of 30% by week 4 Date Initiated: 03/01/2016 Goal Status: Active Ulcer/skin breakdown will have a volume reduction of 50% by week 8 Date Initiated: 03/01/2016 Goal Status: Active Ulcer/skin breakdown will have a volume reduction of 80% by week 12 Date Initiated: 03/01/2016 Goal Status: Active Interventions: Assess ulceration(s) every visit Notes: Electronic Signature(s) Signed: 04/25/2016 4:48:24 PM By: Alejandro Mulling Entered By: Alejandro Mulling on 04/25/2016 09:00:06 [name] ([id number]) -------------------------------------------------------------------------------- Pain Assessment Details Patient Name: [name] Date of Service: 04/25/2016 8:30 AM Medical Record Number: [id number] Patient Account Number: [id number] Date of Birth/Sex: 02-14-1946 (70 y.o. Male) Treating RN: Phillis Haggis Primary Care Physician: Oretha Milch Other Clinician: Referring Physician: Oretha Milch Treating Physician/Extender: Rudene Re in Treatment: 16 Active Problems Location of Pain Severity and Description of Pain Patient Has Paino No Site Locations Pain Management and Medication Current Pain Management: Electronic Signature(s) Signed: 04/25/2016 4:48:24 PM By: Alejandro Mulling Entered By: Alejandro Mulling on 04/25/2016 08:39:41 [name] ([id number]) -------------------------------------------------------------------------------- Patient/Caregiver Education Details Patient Name: [name] Date of Service: 04/25/2016 8:30 AM Medical Record Number: [id number] Patient Account Number: [id number] Date of Birth/Gender: [birth year] (70 y.o. Male) Treating RN: Phillis Haggis Primary Care Physician: Oretha Milch Other Clinician: Referring Physician: Oretha Milch Treating Physician/Extender: Rudene Re in Treatment: 16 Education Assessment Education Provided To: Patient Education  Topics Provided Wound/Skin Impairment: Handouts: Other: change dressing as ordered Methods: Demonstration, Explain/Verbal Responses: State content correctly Electronic Signature(s) Signed: 04/25/2016 4:48:24 PM By: Alejandro Mulling Entered By: Alejandro Mulling on 04/25/2016 09:10:25 Ocain, Kunal V. ([id number]) -------------------------------------------------------------------------------- Wound Assessment  Details Patient Name: OLIVE, MOTYKA. Date of Service: 04/25/2016 8:30 AM Medical Record Number: [id number] Patient Account Number: [id number] Date of Birth/Sex: [birth year] (70 y.o. Male) Treating RN: Phillis Haggis Primary Care Physician: Oretha Milch Other Clinician: Referring Physician: Oretha Milch Treating Physician/Extender: Rudene Re in Treatment: 16 Wound Status Wound Number: 1 Primary Venous Leg Ulcer Etiology: Wound Location: Left Lower Leg Wound Open Wounding Event: Gradually Appeared Status: Date Acquired: 12/31/2014 Comorbid Cataracts, Chronic Obstructive Weeks Of Treatment: 16 History: Pulmonary Disease (COPD), Sleep Clustered Wound: No Apnea, Angina, Congestive Heart Failure, Hypertension, Type II Diabetes, Gout, Osteoarthritis, Neuropathy Photos Photo Uploaded By: Alejandro Mulling on 04/25/2016 15:58:28 Wound Measurements Length: (cm) 7.5 Width: (cm) 7 Depth: (cm) 0.1 Area: (cm) 41.233 Volume: (cm) 4.123 % Reduction in Area: -68.3% % Reduction in Volume: -68.3% Epithelialization: Medium (34-66%) Tunneling: No Undermining: No Wound Description Classification: Partial Thickness Foul Odor A Diabetic Severity (Wagner): Grade 1 Wound Margin: Flat and Intact Exudate Amount: Large Exudate Type: Serosanguineous Exudate Color: red, brown fter Cleansing: No Wound Bed Granulation Amount: Large (67-100%) Exposed Structure Morand, Amel V. ([id number]) Granulation Quality: Pink Fascia Exposed: No Necrotic Amount: Small (1-33%) Fat Layer  Exposed: No Necrotic Quality: Adherent Slough Tendon Exposed: No Muscle Exposed: No Joint Exposed: No Bone Exposed: No Limited to Skin Breakdown Periwound Skin Texture Texture Color No Abnormalities Noted: No No Abnormalities Noted: No Localized Edema: Yes Erythema: Yes Erythema Location: Circumferential Moisture No Abnormalities Noted: No Temperature / Pain Moist: Yes Temperature: No Abnormality Tenderness on Palpation: Yes Wound Preparation Ulcer Cleansing: Other: soap and water, Topical Anesthetic Applied: Other: lidocaine 4%, Treatment Notes Wound #1 (Left Lower Leg) 1. Cleansed with: Cleanse wound with antibacterial soap and water 2. Anesthetic Topical Lidocaine 4% cream to wound bed prior to debridement 4. Dressing Applied: Aquacel Ag 5. Secondary Dressing Applied Kerlix/Conform 7. Secured with Secretary/administrator) Signed: 04/25/2016 4:48:24 PM By: Alejandro Mulling Entered By: Alejandro Mulling on 04/25/2016 08:55:58 [name] ([id number]) -------------------------------------------------------------------------------- Wound Assessment Details Patient Name: [name] Date of Service: 04/25/2016 8:30 AM Medical Record Number: [id number] Patient Account Number: [id number] Date of Birth/Sex: [birth year] (70 y.o. Male) Treating RN: Phillis Haggis Primary Care Physician: Oretha Milch Other Clinician: Referring Physician: Oretha Milch Treating Physician/Extender: Rudene Re in Treatment: 16 Wound Status Wound Number: 2 Primary Venous Leg Ulcer Etiology: Wound Location: Right Lower Leg Wound Open Wounding Event: Gradually Appeared Status: Date Acquired: 12/31/2014 Comorbid Cataracts, Chronic Obstructive Weeks Of Treatment: 16 History: Pulmonary Disease (COPD), Sleep Clustered Wound: No Apnea, Angina, Congestive Heart Failure, Hypertension, Type II Diabetes, Gout, Osteoarthritis, Neuropathy Photos Photo Uploaded By: Alejandro Mulling on 04/25/2016 15:58:36 Wound Measurements Length: (cm) 3 Width: (cm) 4 Depth: (cm) 0.1 Area: (cm) 9.425 Volume: (cm) 0.942 % Reduction in Area: -3893.6% % Reduction in Volume: -3825% Epithelialization: Large (67-100%) Tunneling: No Undermining: No Wound Description Classification: Partial Thickness Foul Odor Af Diabetic Severity [address]): Grade 1 Wound Margin: Flat and Intact Exudate Amount: Medium Exudate Type: Serous Exudate Color: amber ter Cleansing: No Wound Bed Granulation Amount: Large (67-100%) Exposed Structure Foisy, Laquinn V. ([id number]) Granulation Quality: Red Fascia Exposed: No Necrotic Amount: Small (1-33%) Fat Layer Exposed: No Necrotic Quality: Adherent Slough Tendon Exposed: No Muscle Exposed: No Joint Exposed: No Bone Exposed: No Limited to Skin Breakdown Periwound Skin Texture Texture Color No Abnormalities Noted: No No Abnormalities Noted: No Localized Edema: No Erythema: Yes Erythema Location: Circumferential Moisture No Abnormalities Noted: No Temperature / Pain Maceration: Yes Temperature: No Abnormality Moist:  Yes Tenderness on Palpation: Yes Wound Preparation Ulcer Cleansing: Other: soap and water, Topical Anesthetic Applied: None Treatment Notes Wound #2 (Right Lower Leg) 1. Cleansed with: Cleanse wound with antibacterial soap and water 2. Anesthetic Topical Lidocaine 4% cream to wound bed prior to debridement 4. Dressing Applied: Aquacel Ag 5. Secondary Dressing Applied Kerlix/Conform 7. Secured with Secretary/administrator) Signed: 04/25/2016 4:48:24 PM By: Alejandro Mulling Entered By: Alejandro Mulling on 04/25/2016 08:57:09 [name] ([id number]) -------------------------------------------------------------------------------- Wound Assessment Details Patient Name: [name] Date of Service: 04/25/2016 8:30 AM Medical Record Number: [id number] Patient Account Number: [id number] Date of  Birth/Sex: [birth year] (70 y.o. Male) Treating RN: Phillis Haggis Primary Care Physician: Oretha Milch Other Clinician: Referring Physician: Oretha Milch Treating Physician/Extender: Rudene Re in Treatment: 16 Wound Status Wound Number: 3 Primary Pressure Ulcer Etiology: Wound Location: Right Calcaneus Wound Open Wounding Event: Pressure Injury Status: Date Acquired: 10/31/2015 Comorbid Cataracts, Chronic Obstructive Weeks Of Treatment: 16 History: Pulmonary Disease (COPD), Sleep Clustered Wound: No Apnea, Angina, Congestive Heart Failure, Hypertension, Type II Diabetes, Gout, Osteoarthritis, Neuropathy Photos Photo Uploaded By: Alejandro Mulling on 04/25/2016 15:58:43 Wound Measurements Length: (cm) 1.7 Width: (cm) 3 Depth: (cm) 0.2 Area: (cm) 4.006 Volume: (cm) 0.801 % Reduction in Area: 43.3% % Reduction in Volume: 43.4% Epithelialization: None Tunneling: No Undermining: No Wound Description Classification: Category/Stage III Foul Odor Af Diabetic Severity (Wagner): Grade 1 Wound Margin: Flat and Intact Exudate Amount: Large Exudate Type: Serous Exudate Color: amber ter Cleansing: No Wound Bed Granulation Amount: Small (1-33%) Exposed Structure Burrill, Erasmus V. ([id number]) Granulation Quality: Red, Pink Fascia Exposed: No Necrotic Amount: Large (67-100%) Fat Layer Exposed: No Necrotic Quality: Adherent Slough Tendon Exposed: No Muscle Exposed: No Joint Exposed: No Bone Exposed: No Limited to Skin Breakdown Periwound Skin Texture Texture Color No Abnormalities Noted: No No Abnormalities Noted: No Localized Edema: Yes Erythema: Yes Erythema Location: Circumferential Moisture No Abnormalities Noted: No Temperature / Pain Maceration: Yes Temperature: No Abnormality Moist: Yes Tenderness on Palpation: Yes Wound Preparation Ulcer Cleansing: Rinsed/Irrigated with Saline Topical Anesthetic Applied: Other: lidocaine 4%, Treatment  Notes Wound #3 (Right Calcaneus) 1. Cleansed with: Cleanse wound with antibacterial soap and water 2. Anesthetic Topical Lidocaine 4% cream to wound bed prior to debridement 3. Peri-wound Care: Barrier cream 4. Dressing Applied: Aquacel Ag 5. Secondary Dressing Applied Bordered Foam Dressing Electronic Signature(s) Signed: 04/25/2016 4:48:24 PM By: Alejandro Mulling Entered By: Alejandro Mulling on 04/25/2016 08:58:34 [name] ([id number]) -------------------------------------------------------------------------------- Wound Assessment Details Patient Name: [name] Date of Service: 04/25/2016 8:30 AM Medical Record Number: [id number] Patient Account Number: [id number] Date of Birth/Sex: 08-Oct-1946 (70 y.o. Male) Treating RN: Phillis Haggis Primary Care Physician: Oretha Milch Other Clinician: Referring Physician: Oretha Milch Treating Physician/Extender: Rudene Re in Treatment: 16 Wound Status Wound Number: 6 Primary Pressure Ulcer Etiology: Wound Location: Right Foot - Lateral Wound Open Wounding Event: Pressure Injury Status: Date Acquired: 02/16/2016 Comorbid Cataracts, Chronic Obstructive Weeks Of Treatment: 9 History: Pulmonary Disease (COPD), Sleep Clustered Wound: No Apnea, Angina, Congestive Heart Failure, Hypertension, Type II Diabetes, Gout, Osteoarthritis, Neuropathy Photos Photo Uploaded By: Alejandro Mulling on 04/25/2016 15:58:51 Wound Measurements Length: (cm) 0.4 Width: (cm) 0.4 Depth: (cm) 0.1 Area: (cm) 0.126 Volume: (cm) 0.013 % Reduction in Area: 19.7% % Reduction in Volume: 18.8% Epithelialization: None Tunneling: No Undermining: No Wound Description 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 ([id number]) Granulation Quality: Pink Fascia Exposed: No Necrotic Amount: Large (67-100%) Fat Layer Exposed: No Necrotic Quality: Adherent Slough Tendon Exposed: No Muscle Exposed: No Joint Exposed: No Bone Exposed: No Limited to Skin Breakdown Periwound Skin Texture Texture Color No Abnormalities Noted: No No Abnormalities Noted: No Moisture Temperature / Pain No Abnormalities Noted: No Temperature: No Abnormality Moist: Yes Tenderness on Palpation: Yes Wound Preparation Ulcer Cleansing: Rinsed/Irrigated with Saline Topical Anesthetic Applied: Other: lidocaine 4%, Treatment Notes Wound #6 (Right, Lateral Foot) 1. Cleansed with: Cleanse wound with antibacterial soap and water 2. Anesthetic Topical Lidocaine 4% cream to wound bed prior to debridement 3. Peri-wound Care: Barrier cream 4. Dressing Applied: Aquacel Ag 5. Secondary Dressing Applied Bordered Foam Dressing Electronic Signature(s) Signed: 04/25/2016 4:48:24 PM By: Alejandro Mulling Entered By: Alejandro Mulling on 04/25/2016 08:58:02 [name] ([id number]) -------------------------------------------------------------------------------- Vitals Details Patient Name: [name] Date of Service: 04/25/2016 8:30 AM Medical Record Number: [id number] Patient Account Number: [id number] Date of Birth/Sex: 04-09-1946 (70 y.o. Male) Treating RN: Phillis Haggis Primary Care Physician: Oretha Milch Other Clinician: Referring Physician: Oretha Milch Treating Physician/Extender: Rudene Re in Treatment: 16 Vital Signs Time Taken: 08:39 Temperature (F): 97.9 Height (in): 70 Pulse (bpm): 66 Weight (lbs): 235 Respiratory Rate (breaths/min): 18 Body Mass Index (BMI): 33.7 Blood Pressure (mmHg): 130/72 Reference Range: 80 - 120 mg / dl Electronic Signature(s) Signed: 04/25/2016 4:48:24 PM By: Alejandro Mulling Entered By: Alejandro Mulling on 04/25/2016 08:40:30

## 2016-04-26 NOTE — Progress Notes (Signed)
NAOKI, MIGLIACCIO ([id number]) Visit Report for 04/25/2016 Chief Complaint Document Details Patient Name: [name], [name] Date of Service: 04/25/2016 8:30 AM Medical Record Number: [id number] Patient Account Number: [id number] Date of Birth/Sex: 04/27/1946 (70 y.o. Male) Treating RN: Phillis Haggis Primary Care Physician: Oretha Milch Other Clinician: Referring Physician: Oretha Milch Treating Physician/Extender: Rudene Re in Treatment: 16 Information Obtained from: Patient Chief Complaint Patient is at the clinic for treatment of an open pressure ulcer the left upper thigh and gluteal region and the right heel with bilateral swelling of the legs all of it which is going on for over a year Electronic Signature(s) Signed: 04/25/2016 9:09:52 AM By: Evlyn Kanner MD, FACS Entered By: Evlyn Kanner on 04/25/2016 09:09:51 [name] ([id number]) -------------------------------------------------------------------------------- HPI Details Patient Name: [name] Date of Service: 04/25/2016 8:30 AM Medical Record Number: [id number] Patient Account Number: [id number] Date of Birth/Sex: [birth year] (70 y.o. Male) Treating RN: Phillis Haggis Primary Care Physician: Oretha Milch Other Clinician: Referring Physician: Oretha Milch Treating Physician/Extender: Rudene Re in Treatment: 16 History of Present Illness Location: ulcerated area on the right heel, left gluteal region and thigh and then bilateral lower extremities Quality: Patient reports experiencing a sharp pain to affected area(s). Severity: Patient states wound are getting worse. Duration: Patient has had the wound for > 12 months prior to seeking treatment at the wound center Timing: Pain in wound is constant (hurts all the time) Context: The wound appeared gradually over time Modifying Factors: Other treatment(s) tried include: he sees his heart doctor and his primary care doctor Associated Signs and  Symptoms: patient has not been able to walk for over a year now HPI Description: 70 year old gentleman with a known history of hypertension, diabetes, obstructive sleep apnea, COPD, diastolic CHF, coronary artery disease was admitted to the hospital with sepsis from an ulcer of the right heel and was treated there in October 2016. he also has chronic bilateral lower extremity edema and lymphedema. He had received vancomycin, Zosyn and at that stage and x-rays showed hardware in the right ankle but no evidence of osteomyelitis. he was a former smoker. he was also treated with Augmentin orally for 10 days. He is either bedbound or wheelchair-bound and does not ambulate by himself. 01/19/2016 -- he has not been seen here for 3 weeks and this was because he was admitted to [facility] between 223 and 01/08/2016 for sepsis, UTI and pneumonia involving the left lung. he was treated with IV vancomycin and Zosyn and then meropenem. Was discharged home on oral Bactrim for 2 weeks none of his vascular test or x-rays were done and we will reorder these. 01/26/2016 -- he has not yet done the x-ray of his foot and is vascular tests are still pending. I have asked him to work on these with his nursing home staff. Addendum: he has got an x-ray of the right foot done which shows that his osteopenia but no specific ostial lysis or abnormal periosteal reaction. Final impression was degenerative changes with dorsal foot soft tissue swelling. 02/16/2016 -- lower extremity arterial duplex examination shows a 50-99% stenosis of the right tibioperoneal trunk. He had biphasic flow in the right SFA, popliteal and tibioperoneal trunk. Left-sided he had triphasic flow throughout 02/29/2016 -- he is awaiting his vascular opinion with Dr. Gilda Crease which is to be done on April 24 03/08/2016 -- he has not kept his appointment with Dr. Gilda Crease on April 24 and does not seem to know what happened  about  this. it's difficult to gauge whether he is in full control of his mental faculties as at times he is extremely rude to the nursing staff. 03/22/2016 -- the patient was seen by Dr. 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 Recommended that the patient had severe atherosclerotic changes of both lower extremities associated with ulceration and tissue loss of the foot. This is a limb threatening ischemia and the patient was recommended to undergo Dopson, Macarius V. ([id number]) angiography of the lower extremity with a hope for intervention for limb salvage. Patient agreed and will proceed to angiography. He was admitted to the hospital between May 2 and 03/15/2016 - he underwent induction of a catheter into his right lower extremity and third order catheter placement with contrast injection to the right lower extremity for distal runoff. Percutaneous transluminal angioplasty of the right superficial femoral and popliteal arteries were done. He also had a right peroneal angioplasty. Patient had a postoperative hematoma and was admitted for observation and in the next 24 hours he was very agitated and combative and had to be seen by psychiatric. He had a follow-up with Dr. Gilda Crease in 3 weeks. 04/18/2016 -- notes reviewed from the vascular office where he was seen for his postop visit after the PTA of the right SFA and popliteal arteries on 03/12/2016. He underwent an ABI which showed right ABI to be more than 1.3 and left to be more than 1 more than 1.3, great toe and PPG waveforms are decreased bilaterally. No additional intervention indicated at this time and the patient was to follow-up in 3 months with an ABI and bilateral lower extremity duplex study. Electronic Signature(s) Signed: 04/25/2016 9:09:58 AM By: Evlyn Kanner MD, FACS Entered By: Evlyn Kanner on 04/25/2016  09:09:58 [name] ([id number]) -------------------------------------------------------------------------------- Physical Exam Details Patient Name: [name] Date of Service: 04/25/2016 8:30 AM Medical Record Number: [id number] Patient Account Number: [id number] Date of Birth/Sex: 1946-01-29 (70 y.o. Male) Treating RN: Phillis Haggis Primary Care Physician: Oretha Milch Other Clinician: Referring Physician: Oretha Milch Treating Physician/Extender: Rudene Re in Treatment: 16 Constitutional . Pulse regular. Respirations normal and unlabored. Afebrile. . Eyes Nonicteric. Reactive to light. Ears, Nose, Mouth, and Throat Lips, teeth, and gums WNL.Marland Kitchen Moist mucosa without lesions. Neck supple and nontender. No palpable supraclavicular or cervical adenopathy. Normal sized without goiter. Respiratory WNL. No retractions.. Breath sounds WNL, No rubs, rales, rhonchi, or wheeze.. Cardiovascular Heart rhythm and rate regular, no murmur or gallop.. Pedal Pulses WNL. No clubbing, cyanosis or edema. Lymphatic No adneopathy. No adenopathy. No adenopathy. Musculoskeletal Adexa without tenderness or enlargement.. Digits and nails w/o clubbing, cyanosis, infection, petechiae, ischemia, or inflammatory conditions.. Integumentary (Hair, Skin) No suspicious lesions. No crepitus or fluctuance. No peri-wound warmth or erythema. No masses.Marland Kitchen Psychiatric Judgement and insight Intact.. No evidence of depression, anxiety, or agitation.. Notes the wound on the medial part of the right heel is clean and after washing it out with moist saline gauze the was clean granulation tissue and no debridement was required. The right lateral foot continues to have a very small ulcerated area but it is clean. The edema on both lower extremities continues to decrease but his superficial breakdown of skin and small ulcerations continue. Electronic Signature(s) Signed: 04/25/2016 9:11:08 AM By: Evlyn Kanner MD, FACS Entered By: Evlyn Kanner on 04/25/2016 09:11:08 [name] ([id number]) -------------------------------------------------------------------------------- Physician Orders Details Patient  Name: ELISE, GLADDEN. Date of Service: 04/25/2016 8:30 AM Medical Record Number: [id number] Patient Account Number: [id number] Date of Birth/Sex: [birth year] (70 y.o. Male) Treating RN: Phillis Haggis Primary Care Physician: Oretha Milch Other Clinician: Referring Physician: Oretha Milch Treating Physician/Extender: Rudene Re in Treatment: 69 Verbal / Phone Orders: No Diagnosis Coding Wound Cleansing Wound #1 Left Lower Leg o May Shower, gently pat wound dry prior to applying new dressing. Wound #2 Right Lower Leg o May Shower, gently pat wound dry prior to applying new dressing. Wound #3 Right Calcaneus o May Shower, gently pat wound dry prior to applying new dressing. Wound #6 Right,Lateral Foot o May Shower, gently pat wound dry prior to applying new dressing. Anesthetic Wound #1 Left Lower Leg o Topical Lidocaine 4% cream applied to wound bed prior to debridement - for clinic use only o Topical Lidocaine 4% cream applied to wound bed prior to debridement - for clinic use only Wound #3 Right Calcaneus o Topical Lidocaine 4% cream applied to wound bed prior to debridement - for clinic use only Wound #6 Right,Lateral Foot o Topical Lidocaine 4% cream applied to wound bed prior to debridement - for clinic use only o Topical Lidocaine 4% cream applied to wound bed prior to debridement - for clinic use only Skin Barriers/Peri-Wound Care Wound #3 Right Calcaneus o Skin Prep Wound #6 Right,Lateral Foot o Skin Prep Primary Wound Dressing Wound #1 Left Lower Leg o Aquacel Ag - Please wet with saline before removing Wound #2 Right Lower Leg Lamp, Kaio V. ([id number]) o Aquacel Ag - Please wet with saline before removing Wound #3 Right  Calcaneus o Aquacel Ag - Please wet with saline before removing Wound #6 Right,Lateral Foot o Aquacel Ag - Please wet with saline before removing Secondary Dressing Wound #1 Left Lower Leg o ABD pad o Conform/Kerlix Wound #2 Right Lower Leg o ABD pad o Conform/Kerlix Wound #3 Right Calcaneus o Boardered Foam Dressing Wound #6 Right,Lateral Foot o Boardered Foam Dressing Dressing Change Frequency Wound #1 Left Lower Leg o Change dressing every day. - to be changed daily by SNF RN Wound #2 Right Lower Leg o Change dressing every day. - to be changed daily by SNF RN Wound #3 Right Calcaneus o Change dressing every day. - to be changed daily by SNF RN Wound #6 Right,Lateral Foot o Change dressing every day. - to be changed daily by SNF RN Follow-up Appointments Wound #1 Left Lower Leg o Return Appointment in 1 week. Wound #2 Right Lower Leg o Return Appointment in 1 week. Wound #3 Right Calcaneus o Return Appointment in 1 week. Wound #6 Right,Lateral Foot o Return Appointment in 1 week. Daequan, Kozma Nasiah V. ([id number]) Edema Control Wound #1 Left Lower Leg o Elevate legs to the level of the heart and pump ankles as often as possible o Support Garment 20-30 mm/Hg pressure to: - Please place these on in the morning before he gets up and take off only at shower time and at bedtime. Nurse to assist. Wound #2 Right Lower Leg o Elevate legs to the level of the heart and pump ankles as often as possible o Support Garment 20-30 mm/Hg pressure to: - Please place these on in the morning before he gets up and take off only at shower time and at bedtime. Nurse to assist. Wound #6 Right,Lateral Foot o Elevate legs to the level of the heart and pump ankles as often as possible o Support Garment 20-30 mm/Hg pressure to: -  Please place these on in the morning before he gets up and take off only at shower time and at bedtime. Nurse to  assist. Off-Loading Wound #1 Left Lower Leg o Turn and reposition every 2 hours o Other: - sage boots at night and float heel when lying in bed Wound #2 Right Lower Leg o Turn and reposition every 2 hours o Other: - sage boots at night and float heel when lying in bed Wound #3 Right Calcaneus o Turn and reposition every 2 hours o Other: - sage boots at night and float heel when lying in bed Wound #6 Right,Lateral Foot o Turn and reposition every 2 hours o Other: - sage boots at night and float heel when lying in bed Additional Orders / Instructions Wound #1 Left Lower Leg o Increase protein intake. Wound #2 Right Lower Leg o Increase protein intake. Wound #3 Right Calcaneus o Increase protein intake. Volner, Sohan V. ([id number]) Wound #6 Right,Lateral Foot o Increase protein intake. Medications-please add to medication list. Wound #1 Left Lower Leg o Other: - Vitamin C, Vitamin A, Zinc, multivitamin Wound #2 Right Lower Leg o Other: - Vitamin C, Vitamin A, Zinc, multivitamin Wound #3 Right Calcaneus o Other: - Vitamin C, Vitamin A, Zinc, multivitamin Wound #6 Right,Lateral Foot o Other: - Vitamin C, Vitamin A, Zinc, multivitamin Electronic Signature(s) Signed: 04/25/2016 3:21:55 PM By: Evlyn Kanner MD, FACS Signed: 04/25/2016 4:48:24 PM By: Alejandro Mulling Entered By: Alejandro Mulling on 04/25/2016 09:45:58 Napoli, Isaiahs Seth Bake ([id number]) -------------------------------------------------------------------------------- Problem List Details Patient Name: [name] Date of Service: 04/25/2016 8:30 AM Medical Record Number: [id number] Patient Account Number: [id number] Date of Birth/Sex: Aug 03, 1946 (70 y.o. Male) Treating RN: Phillis Haggis Primary Care Physician: Oretha Milch Other Clinician: Referring Physician: Oretha Milch Treating Physician/Extender: Rudene Re in Treatment: 16 Active Problems ICD-10 Encounter Code  Description Active Date Diagnosis E11.621 Type 2 diabetes mellitus with foot ulcer 01/01/2016 Yes L89.613 Pressure ulcer of right heel, stage 3 01/01/2016 Yes E66.01 Morbid (severe) obesity due to excess calories 01/01/2016 Yes I89.0 Lymphedema, not elsewhere classified 01/01/2016 Yes M70.871 Other soft tissue disorders related to use, overuse and 01/19/2016 Yes pressure, right ankle and foot 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/16/2016 Yes ulceration of heel and midfoot Inactive Problems Resolved Problems ICD-10 Code Description Active Date Resolved Date L89.322 Pressure ulcer of left buttock, stage 2 01/01/2016 01/01/2016 Electronic Signature(s) Signed: 04/25/2016 9:09:42 AM By: Evlyn Kanner MD, FACS Entered By: Evlyn Kanner on 04/25/2016 09:09:42 [name] ([id number]) [name], Khary VMarland Kitchen ([id number]) -------------------------------------------------------------------------------- Progress Note Details Patient Name: [name] Date of Service: 04/25/2016 8:30 AM Medical Record Number: [id number] Patient Account Number: [id number] Date of Birth/Sex: 1946-02-26 (70 y.o. Male) Treating RN: Phillis Haggis Primary Care Physician: Oretha Milch Other Clinician: Referring Physician: Oretha Milch Treating Physician/Extender: Rudene Re in Treatment: 16 Subjective Chief Complaint Information obtained from Patient Patient is at the clinic for treatment of an open pressure ulcer the left upper thigh and gluteal region and the right heel with bilateral swelling of the legs all of it which is going on for over a year History of Present Illness (HPI) The following HPI elements were documented for the patient's wound: Location: ulcerated area on the right heel, left gluteal region and thigh and then bilateral lower extremities Quality: Patient reports experiencing a sharp pain to affected area(s). Severity: Patient states wound are getting  worse. Duration: Patient has had the wound for > 12 months prior to  seeking treatment at the wound center Timing: Pain in wound is constant (hurts all the time) Context: The wound appeared gradually over time Modifying Factors: Other treatment(s) tried include: he sees his heart doctor and his primary care doctor Associated Signs and Symptoms: patient has not been able to walk for over a year now 70 year old gentleman with a known history of hypertension, diabetes, obstructive sleep apnea, COPD, diastolic CHF, coronary artery disease was admitted to the hospital with sepsis from an ulcer of the right heel and was treated there in October 2016. he also has chronic bilateral lower extremity edema and lymphedema. He had received vancomycin, Zosyn and at that stage and x-rays showed hardware in the right ankle but no evidence of osteomyelitis. he was a former smoker. he was also treated with Augmentin orally for 10 days. He is either bedbound or wheelchair-bound and does not ambulate by himself. 01/19/2016 -- he has not been seen here for 3 weeks and this was because he was admitted to [facility] between 223 and 01/08/2016 for sepsis, UTI and pneumonia involving the left lung. he was treated with IV vancomycin and Zosyn and then meropenem. Was discharged home on oral Bactrim for 2 weeks none of his vascular test or x-rays were done and we will reorder these. 01/26/2016 -- he has not yet done the x-ray of his foot and is vascular tests are still pending. I have asked him to work on these with his nursing home staff. Addendum: he has got an x-ray of the right foot done which shows that his osteopenia but no specific ostial lysis or abnormal periosteal reaction. Final impression was degenerative changes with dorsal foot soft tissue swelling. 02/16/2016 -- lower extremity arterial duplex examination shows a 50-99% stenosis of the right tibioperoneal trunk. He had biphasic flow  in the right SFA, popliteal and tibioperoneal trunk. Left-sided he had triphasic flow throughout IZAIH, KATAOKA ([id number]) 02/29/2016 -- he is awaiting his vascular opinion with Dr. Gilda Crease which is to be done on April 24 03/08/2016 -- he has not kept his appointment with Dr. Gilda Crease on April 24 and does not seem to know what happened about this. it's difficult to gauge whether he is in full control of his mental faculties as at times he is extremely rude to the nursing staff. 03/22/2016 -- the patient was seen by Dr. 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 Recommended that the patient had severe atherosclerotic changes of both lower extremities associated with ulceration and tissue loss of the foot. This is a limb threatening ischemia and the patient was recommended to undergo angiography of the lower extremity with a hope for intervention for limb salvage. Patient agreed and will proceed to angiography. He was admitted to the hospital between May 2 and 03/15/2016 - he underwent induction of a catheter into his right lower extremity and third order catheter placement with contrast injection to the right lower extremity for distal runoff. Percutaneous transluminal angioplasty of the right superficial femoral and popliteal arteries were done. He also had a right peroneal angioplasty. Patient had a postoperative hematoma and was admitted for observation and in the next 24 hours he was very agitated and combative and had to be seen by psychiatric. He had a follow-up with Dr. Gilda Crease in 3 weeks. 04/18/2016 -- notes reviewed from the vascular office where he was seen for his postop visit after  the PTA of the right SFA and popliteal arteries on 03/12/2016. He underwent an ABI which showed right ABI to be more than 1.3 and left to be more than 1 more than 1.3, great toe and PPG  waveforms are decreased bilaterally. No additional intervention indicated at this time and the patient was to follow-up in 3 months with an ABI and bilateral lower extremity duplex study. Objective Constitutional Pulse regular. Respirations normal and unlabored. Afebrile. Vitals Time Taken: 8:39 AM, Height: 70 in, Weight: 235 lbs, BMI: 33.7, Temperature: 97.9 F, Pulse: 66 bpm, Respiratory Rate: 18 breaths/min, Blood Pressure: 130/72 mmHg. Eyes Nonicteric. Reactive to light. Ears, Nose, Mouth, and Throat Lips, teeth, and gums WNL.Marland Kitchen Moist mucosa without lesions. Neck supple and nontender. No palpable supraclavicular or cervical adenopathy. Normal sized without goiter. Respiratory WNL. No retractions.. Breath sounds WNL, No rubs, rales, rhonchi, or wheeze.Marland Kitchen Shelp, Reynald V. ([id number]) Cardiovascular Heart rhythm and rate regular, no murmur or gallop.. Pedal Pulses WNL. No clubbing, cyanosis or edema. Lymphatic No adneopathy. No adenopathy. No adenopathy. Musculoskeletal Adexa without tenderness or enlargement.. Digits and nails w/o clubbing, cyanosis, infection, petechiae, ischemia, or inflammatory conditions.Marland Kitchen Psychiatric Judgement and insight Intact.. No evidence of depression, anxiety, or agitation.. General Notes: the wound on the medial part of the right heel is clean and after washing it out with moist saline gauze the was clean granulation tissue and no debridement was required. The right lateral foot continues to have a very small ulcerated area but it is clean. The edema on both lower extremities continues to decrease but his superficial breakdown of skin and small ulcerations continue. Integumentary (Hair, Skin) No suspicious lesions. No crepitus or fluctuance. No peri-wound warmth or erythema. No masses.. Wound #1 status is Open. Original cause of wound was Gradually Appeared. The wound is located on the Left Lower Leg. The wound measures 7.5cm length x 7cm width x 0.1cm  depth; 41.233cm^2 area and 4.123cm^3 volume. The wound is limited to skin breakdown. There is no tunneling or undermining noted. There is a large amount of serosanguineous drainage noted. The wound margin is flat and intact. There is large (67-100%) pink granulation within the wound bed. There is a small (1-33%) amount of necrotic tissue within the wound bed including Adherent Slough. The periwound skin appearance exhibited: Localized Edema, Moist, Erythema. The surrounding wound skin color is noted with erythema which is circumferential. Periwound temperature was noted as No Abnormality. The periwound has tenderness on palpation. Wound #2 status is Open. Original cause of wound was Gradually Appeared. The wound is located on the Right Lower Leg. The wound measures 3cm length x 4cm width x 0.1cm depth; 9.425cm^2 area and 0.942cm^3 volume. The wound is limited to skin breakdown. There is no tunneling or undermining noted. There is a medium amount of serous drainage noted. The wound margin is flat and intact. There is large (67-100%) red granulation within the wound bed. There is a small (1-33%) amount of necrotic tissue within the wound bed including Adherent Slough. The periwound skin appearance exhibited: Maceration, Moist, Erythema. The periwound skin appearance did not exhibit: Localized Edema. The surrounding wound skin color is noted with erythema which is circumferential. Periwound temperature was noted as No Abnormality. The periwound has tenderness on palpation. Wound #3 status is Open. Original cause of wound was Pressure Injury. The wound is located on the Right Calcaneus. The wound measures 1.7cm length x 3cm width x 0.2cm depth; 4.006cm^2 area and 0.801cm^3 volume. The wound is limited to skin breakdown.  There is no tunneling or undermining noted. There is a large amount of serous drainage noted. The wound margin is flat and intact. There is small (1-33%) red, pink granulation within  the wound bed. There is a large (67-100%) amount of necrotic tissue within the wound bed including Adherent Slough. The periwound skin appearance exhibited: Localized Edema, Maceration, Moist, Erythema. The surrounding wound skin color is noted with erythema which is circumferential. Periwound temperature was noted as No Abnormality. The periwound has tenderness on palpation. Zimbelman, Burleigh V. ([id number]) Wound #6 status is Open. Original cause of wound was Pressure Injury. The wound is located on the Right,Lateral Foot. The wound measures 0.4cm length x 0.4cm width x 0.1cm depth; 0.126cm^2 area and 0.013cm^3 volume. The wound is limited to skin breakdown. There is no tunneling or undermining noted. There is a large amount of serous drainage noted. The wound margin is flat and intact. There is small (1-33%) pink granulation within the wound bed. There is a large (67-100%) amount of necrotic tissue within the wound bed including Adherent Slough. The periwound skin appearance exhibited: Moist. Periwound temperature was noted as No Abnormality. The periwound has tenderness on palpation. 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 Wound #2 Right Lower Leg: May Shower, gently pat wound dry prior to applying new dressing. Wound #3 Right Calcaneus: May Shower, gently pat wound dry prior to applying new dressing. Wound #6 Right,Lateral Foot: May Shower, gently pat wound dry prior to applying new  dressing. Anesthetic: Wound #1 Left Lower Leg: Topical Lidocaine 4% cream applied to wound bed prior to debridement - for clinic use only Topical Lidocaine 4% cream applied to wound bed prior to debridement - for clinic use only Wound #3 Right Calcaneus: Topical Lidocaine 4% cream applied to wound bed prior to debridement - for clinic use only Wound #6 Right,Lateral Foot: Topical Lidocaine 4% cream applied to wound bed prior to debridement - for clinic use only Topical Lidocaine 4% cream applied to wound bed prior to debridement - for clinic use only Skin Barriers/Peri-Wound Care: Wound #3 Right Calcaneus: Clayton, Justun V. ([id number]) Skin Prep Wound #6 Right,Lateral Foot: Skin Prep Primary Wound Dressing: Wound #1 Left Lower Leg: Aquacel Ag - Please wet with saline before removing Wound #2 Right Lower Leg: Aquacel Ag - Please wet with saline before removing Wound #3 Right Calcaneus: Aquacel Ag - Please wet with saline before removing Wound #6 Right,Lateral Foot: Aquacel Ag - Please wet with saline before removing Secondary Dressing: Wound #1 Left Lower Leg: ABD pad Conform/Kerlix Wound #2 Right Lower Leg: ABD pad Conform/Kerlix Wound #3 Right Calcaneus: Boardered Foam Dressing Wound #6 Right,Lateral Foot: Boardered Foam Dressing Dressing Change Frequency: Wound #1 Left Lower Leg: Change dressing every day. - to be changed daily by SN[facility] RN Wound #2 Right Lower Leg: Change dressing every day. - to be changed daily by SNF RN Wound #3 Right Calcaneus: Change dressing every day. - to be changed daily by SNF RN Wound #6 Right,Lateral Foot: Change dressing every day. - to  be changed daily by SNF RN Follow-up Appointments: Wound #1 Left Lower Leg: Return Appointment in 1 week. Wound #2 Right Lower Leg: Return Appointment in 1 week. Wound #3 Right Calcaneus: Return Appointment in 1 week. Wound #6 Right,Lateral Foot: Return Appointment in 1 week. Edema Control: Wound #1  Left Lower Leg: Elevate legs to the level of the heart and pump ankles as often as possible Support Garment 20-30 mm/Hg pressure to: - Please place these on in the morning before he gets up and take off only at shower time and at bedtime. Nurse to assist. Wound #2 Right Lower Leg: Elevate legs to the level of the heart and pump ankles as often as possible Support Garment 20-30 mm/Hg pressure to: - Please place these on in the morning before he gets up and take off only at shower time and at bedtime. Nurse to assist. Wound #6 Right,Lateral Foot: Hadsall, Payam V. ([id number]) Elevate legs to the level of the heart and pump ankles as often as possible Support Garment 20-30 mm/Hg pressure to: - Please place these on in the morning before he gets up and take off only at shower time and at bedtime. Nurse to assist. Off-Loading: Wound #1 Left Lower Leg: Turn and reposition every 2 hours Other: - sage boots at night and float heel when lying in bed Wound #2 Right Lower Leg: Turn and reposition every 2 hours Other: - sage boots at night and float heel when lying in bed Wound #3 Right Calcaneus: Turn and reposition every 2 hours Other: - sage boots at night and float heel when lying in bed Wound #6 Right,Lateral Foot: Turn and reposition every 2 hours Other: - sage boots at night and float heel when lying in bed Additional Orders / Instructions: Wound #1 Left Lower Leg: Increase protein intake. Wound #2 Right Lower Leg: Increase protein intake. Wound #3 Right Calcaneus: Increase protein intake. Wound #6 Right,Lateral Foot: Increase protein intake. Medications-please add to medication list.: Wound #1 Left Lower Leg: Other: - Vitamin C, Vitamin A, Zinc, multivitamin Wound #2 Right Lower Leg: Other: - Vitamin C, Vitamin A, Zinc, multivitamin Wound #3 Right Calcaneus: Other: - Vitamin C, Vitamin A, Zinc, multivitamin Wound #6 Right,Lateral Foot: Other: - Vitamin C, Vitamin A, Zinc,  multivitamin I have recommended: 1. Aquacell AG to his right heel. The patient also has awound on the right lateral foot. This will be treated with silver alginate 2. Constant off loading and also using Sage boot. 3. bilateral lower extremity compression stockings of the 20-30 minute variety and he has brought these along with him today 4. High-protein diet and multivitamins including vitamin C and zinc. 5. he also has a bit of excoriation on his coccyx and we have asked this area to be protected with a bordered foam. BLANDA, Braelon V. ([id number]) Electronic Signature(s) Signed: 04/25/2016 3:24:21 PM By: Evlyn Kanner MD, FACS Previous Signature: 04/25/2016 9:12:14 AM Version By: Evlyn Kanner MD, FACS Entered By: Evlyn Kanner on 04/25/2016 15:24:21 [name] ([id number]) -------------------------------------------------------------------------------- SuperBill Details Patient Name: [name] Date of Service: 04/25/2016 Medical Record Number: [id number] Patient Account Number: [id number] Date of Birth/Sex: 1946/03/18 (70 y.o. 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 Facility Procedures CPT4 Code: 16109604 Description: 54098 - WOUND CARE VISIT-LEV 5 EST PT Modifier: Quantity: 1 Physician Procedures CPT4: Description Modifier Quantity Code [id number] - WC PHYS LEVEL 3 - EST PT 1 ICD-10 Description  Diagnosis E11.621 Type 2 diabetes mellitus with foot ulcer L89.613 Pressure ulcer of right heel, stage 3 I89.0 Lymphedema, not elsewhere classified  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

## 2016-05-02 ENCOUNTER — Encounter: Payer: Medicare Other | Admitting: Surgery

## 2016-05-02 DIAGNOSIS — E11621 Type 2 diabetes mellitus with foot ulcer: Secondary | ICD-10-CM | POA: Diagnosis not present

## 2016-05-03 NOTE — Progress Notes (Signed)
[name], [name] ([id number]) Visit Report for 05/02/2016 Arrival Information Details Patient Name: [name], [name] Date of Service: 05/02/2016 10:45 AM Medical Record Number: [id number] Patient Account Number: [id number] Date of Birth/Sex: May 09, 1946 (70 y.o. Male) Treating RN: [name], RN, [name], [place] Sink Primary Care Physician: Oretha Milch Other Clinician: Referring Physician: Oretha Milch Treating Physician/Extender: Rudene Re in Treatment: 17 Visit Information History Since Last Visit Added or deleted any medications: No Patient Arrived: Wheel Chair Any new allergies or adverse reactions: No Arrival Time: 10:48 Had a fall or experienced change in No activities of daily living that may affect Accompanied By: self risk of falls: Transfer Assistance: None Signs or symptoms of abuse/neglect since last No Patient Identification Verified: Yes visito Secondary Verification Process Yes Hospitalized since last visit: No Completed: Has Dressing in Place as Prescribed: Yes Patient Requires Transmission-Based No Has Compression in Place as Prescribed: Yes Precautions: Pain Present Now: No Patient Has Alerts: Yes Patient Alerts: DM II Electronic Signature(s) Signed: 05/02/2016 5:30:03 PM By: Elpidio Eric [name], RN Entered By: Elpidio Eric on 05/02/2016 10:49:28 [name] ([id number]) -------------------------------------------------------------------------------- Encounter Discharge Information Details Patient Name: [name] Date of Service: 05/02/2016 10:45 AM Medical Record Number: [id number] Patient Account Number: [id number] Date of Birth/Sex: October 21, 1946 (70 y.o. Male) Treating RN: [name], RN, [name], [place] Sink Primary Care Physician: Oretha Milch Other Clinician: Referring Physician: Oretha Milch Treating Physician/Extender: Rudene Re in Treatment: 17 Encounter Discharge Information Items Schedule Follow-up Appointment: No Medication Reconciliation  completed No and provided to Patient/Care Chrishawn Boley: Provided on Clinical Summary of Care: 05/02/2016 Form Type Recipient Paper Patient EL Electronic Signature(s) Signed: 05/02/2016 11:41:17 AM By: Gwenlyn Perking Entered By: Gwenlyn Perking on 05/02/2016 11:41:17 [place], Ella Seth [name] ([id number]) -------------------------------------------------------------------------------- Lower Extremity Assessment Details Patient Name: [name] Date of Service: 05/02/2016 10:45 AM Medical Record Number: [id number] Patient Account Number: [id number] Date of Birth/Sex: 12-May-1946 (70 y.o. Male) Treating RN: [name], RN, [name], [place] Sink Primary Care Physician: Oretha Milch Other Clinician: Referring Physician: Oretha Milch Treating Physician/Extender: Rudene Re in Treatment: 17 Edema Assessment Assessed: [Left: No] [Right: No] E[Left: dema] [Right: :] Calf Left: Right: Point of Measurement: 34 cm From Medial Instep 36.8 cm 37 cm Ankle Left: Right: Point of Measurement: 12 cm From Medial Instep 24 cm 23.4 cm Vascular Assessment Claudication: Claudication Assessment [Left:None] [Right:None] Pulses: Posterior Tibial Dorsalis Pedis Palpable: [Left:Yes] [Right:Yes] Extremity colors, hair growth, and conditions: Extremity Color: [Left:Mottled] [Right:Mottled] Hair Growth on Extremity: [Left:No] [Right:No] Temperature of Extremity: [Left:Warm] [Right:Warm] Capillary Refill: [Left:< 3 seconds] [Right:< 3 seconds] Electronic Signature(s) Signed: 05/02/2016 5:30:03 PM By: Elpidio Eric [name], RN Entered By: Elpidio Eric on 05/02/2016 10:53:37 [name], [name] ([id number]) -------------------------------------------------------------------------------- Multi Wound Chart Details Patient Name: [name] Date of Service: 05/02/2016 10:45 AM Medical Record Number: [id number] Patient Account Number: [id number] Date of Birth/Sex: 1945-12-01 (70 y.o. Male) Treating RN: [name], RN, [name], [place] Sink Primary  Care Physician: Oretha Milch Other Clinician: Referring Physician: Oretha Milch Treating Physician/Extender: Rudene Re in Treatment: 17 Vital Signs Height(in): 70 Pulse(bpm): 55 Weight(lbs): 235 Blood Pressure 126/58 (mmHg): Body Mass Index(BMI): 34 Temperature(F): 97.8 Respiratory Rate 21 (breaths/min): Photos: [1:No Photos] [2:No Photos] [3:No Photos] Wound Location: [1:Left Lower Leg] [2:Right Lower Leg] [3:Right Calcaneus] Wounding Event: [1:Gradually Appeared] [2:Gradually Appeared] [3:Pressure Injury] Primary Etiology: [1:Venous Leg Ulcer] [2:Venous Leg Ulcer] [3:Pressure Ulcer] Comorbid History: [1:Cataracts, Chronic Obstructive Pulmonary Disease (COPD), Sleep Disease (COPD), Sleep Disease (COPD), Sleep Apnea, Angina, Congestive Heart Failure, Congestive Heart Failure, Congestive Heart Failure, Hypertension, Type  II Diabetes,  Gout, Osteoarthritis, Neuropathy Osteoarthritis, Neuropathy Osteoarthritis, Neuropathy] [2:Cataracts, Chronic Obstructive Pulmonary Apnea, Angina, Hypertension, Type II Diabetes, Gout,] [3:Cataracts, Chronic Obstructive Pulmonary Apnea, Angina,  Hypertension, Type II Diabetes, Gout,] Date Acquired: [1:12/31/2014] [2:12/31/2014] [3:10/31/2015] Weeks of Treatment: [1:17] [2:17] [3:17] Wound Status: [1:Open] [2:Open] [3:Open] Measurements L x W x D 2x1.8x0.1 [2:1x1.8x0.1] [3:1.5x3x0.2] (cm) Area (cm) : [1:2.827] [2:1.414] [3:3.534] Volume (cm) : [1:0.283] [2:0.141] [3:0.707] % Reduction in Area: [1:88.50%] [2:-499.20%] [3:50.00%] % Reduction in Volume: 88.40% [2:-487.50%] [3:50.00%] Classification: [1:Partial Thickness] [2:Partial Thickness] [3:Category/Stage III] HBO Classification: [1:Grade 1] [2:Grade 1] [3:Grade 1] Exudate Amount: [1:Large] [2:Medium] [3:Large] Exudate Type: [1:Serosanguineous] [2:Serous] [3:Serous] Exudate Color: [1:red, brown] [2:amber] [3:amber] Wound Margin: [1:Flat and Intact] [2:Flat and Intact] [3:Flat and  Intact] Granulation Amount: [1:Large (67-100%)] [2:Large (67-100%)] [3:Small (1-33%)] Granulation Quality: [1:Pink] [2:Red] [3:Red, Pink] Necrotic Amount: [1:None Present (0%)] [2:None Present (0%)] [3:Large (67-100%)] Exposed Structures: Fascia: No Fascia: No Fascia: No Fat: No Fat: No Fat: No Tendon: No Tendon: No Tendon: No Muscle: No Muscle: No Muscle: No Joint: No Joint: No Joint: No Bone: No Bone: No Bone: No Limited to Skin Limited to Skin Limited to Skin Breakdown Breakdown Breakdown Epithelialization: Medium (34-66%) Large (67-100%) None Periwound Skin Texture: Edema: Yes Edema: Yes Edema: Yes Periwound Skin Moist: Yes Moist: Yes Maceration: Yes Moisture: Maceration: No Moist: Yes Periwound Skin Color: Erythema: Yes Erythema: Yes Erythema: Yes Erythema Location: Circumferential Circumferential Circumferential Temperature: No Abnormality No Abnormality No Abnormality Tenderness on Yes Yes Yes Palpation: Wound Preparation: Ulcer Cleansing: Other: Ulcer Cleansing: Other: Ulcer Cleansing: soap and water soap and water Rinsed/Irrigated with Saline Topical Anesthetic Topical Anesthetic Applied: Other: lidocaine Applied: None Topical Anesthetic 4% Applied: Other: lidocaine 4% Wound Number: 6 N/A N/A Photos: No Photos N/A N/A Wound Location: Right Foot - Lateral N/A N/A Wounding Event: Pressure Injury N/A N/A Primary Etiology: Pressure Ulcer N/A N/A Comorbid History: Cataracts, Chronic N/A N/A Obstructive Pulmonary Disease (COPD), Sleep Apnea, Angina, Congestive Heart Failure, Hypertension, Type II Diabetes, Gout, Osteoarthritis, Neuropathy Date Acquired: 02/16/2016 N/A N/A Weeks of Treatment: 10 N/A N/A Wound Status: Open N/A N/A Measurements L x W x D 0.3x0.3x0.1 N/A N/A (cm) Area (cm) : 0.071 N/A N/A Volume (cm) : 0.007 N/A N/A % Reduction in Area: 54.80% N/A N/A % Reduction in Volume: 56.30% N/A N/A Classification: Category/Stage II N/A  N/A HBO Classification: Grade 1 N/A N/A Exudate Amount: Large N/A N/A Leikam, Gates V. ([id number]) Exudate Type: Serous N/A N/A Exudate Color: amber N/A N/A Wound Margin: Flat and Intact N/A N/A Granulation Amount: Small (1-33%) N/A N/A Granulation Quality: Pink N/A N/A Necrotic Amount: Large (67-100%) N/A N/A Exposed Structures: Fascia: No N/A N/A Fat: No Tendon: No Muscle: No Joint: No Bone: No Limited to Skin Breakdown Epithelialization: Medium (34-66%) N/A N/A Periwound Skin Texture: Edema: No N/A N/A Excoriation: No Induration: No Callus: No Crepitus: No Fluctuance: No Friable: No Rash: No Scarring: No Periwound Skin Moist: Yes N/A N/A Moisture: Maceration: No Dry/Scaly: No Periwound Skin Color: Atrophie Blanche: No N/A N/A Cyanosis: No Ecchymosis: No Erythema: No Hemosiderin Staining: No Mottled: No Pallor: No Rubor: No Erythema Location: N/A N/A N/A Temperature: No Abnormality N/A N/A Tenderness on Yes N/A N/A Palpation: Wound Preparation: Ulcer Cleansing: N/A N/A Rinsed/Irrigated with Saline Topical Anesthetic Applied: Other: lidocaine 4% Treatment Notes [name] OttoLINDLEY, Dominyck V. ([id number]) Electronic Signature(s) Signed: 05/02/2016 5:30:03 PM By: Elpidio EricAfful, Rita [name], RN Entered By: Elpidio EricAfful, Rita on 05/02/2016 11:24:31 [name] OttoLINDLEY, Kabeer V. ([id number]) -------------------------------------------------------------------------------- Multi-Disciplinary Care Plan Details Patient Name:  Klang, Cray V. Date of Service: 05/02/2016 10:45 AM Medical Record Number: [id number] Patient Account Number: [id number] Date of Birth/Sex: 12/05/1945 (70 y.o. Male) Treating RN: [name], RN, [name], [place] Sink Primary Care Physician: Oretha Milch Other Clinician: Referring Physician: Oretha Milch Treating Physician/Extender: Rudene Re in Treatment: 91 Active Inactive Abuse / Safety / Falls / Self Care Management Nursing Diagnoses: Potential for falls Goals: Patient will  remain injury free Date Initiated: 03/01/2016 Goal Status: Active Interventions: Assess fall risk on admission and as needed Notes: Nutrition Nursing Diagnoses: Imbalanced nutrition Goals: Patient/caregiver agrees to and verbalizes understanding of need to use nutritional supplements and/or vitamins as prescribed Date Initiated: 03/01/2016 Goal Status: Active Interventions: Assess patient nutrition upon admission and as needed per policy Notes: Orientation to the Wound Care Program Nursing Diagnoses: Knowledge deficit related to the wound healing center program Goals: Patient/caregiver will verbalize understanding of the [facility]aling [facility] [address] [place], [place] ([id number]) Date Initiated: 03/01/2016 Goal Status: Active Interventions: Provide education on orientation to the wound center Notes: Pain, Acute or Chronic Nursing Diagnoses: Pain, acute or chronic: actual or potential Potential alteration in comfort, pain Goals: Patient will verbalize adequate pain control and receive pain control interventions during procedures as needed Date Initiated: 03/01/2016 Goal Status: Active Patient/caregiver will verbalize adequate pain control between visits Date Initiated: 03/01/2016 Goal Status: Active Interventions: Assess comfort goal upon admission Complete pain assessment as per visit requirements Notes: Pressure Nursing Diagnoses: Knowledge deficit related to causes and risk factors for pressure ulcer development Knowledge deficit related to management of pressures ulcers Goals: Patient/caregiver will verbalize risk factors for pressure ulcer development Date Initiated: 03/01/2016 Goal Status: Active Interventions: Assess offloading mechanisms upon admission and as needed Notes: Wound/Skin Impairment Nursing Diagnoses: TRAJON, ROSETE ([id number]) Impaired tissue integrity Goals: Ulcer/skin breakdown will have a volume reduction of 30% by week 4 Date Initiated:  03/01/2016 Goal Status: Active Ulcer/skin breakdown will have a volume reduction of 50% by week 8 Date Initiated: 03/01/2016 Goal Status: Active Ulcer/skin breakdown will have a volume reduction of 80% by week 12 Date Initiated: 03/01/2016 Goal Status: Active Interventions: Assess ulceration(s) every visit Notes: Electronic Signature(s) Signed: 05/02/2016 5:30:03 PM By: Elpidio Eric [name], RN Entered By: Elpidio Eric on 05/02/2016 11:24:17 [name] ([id number]) -------------------------------------------------------------------------------- Pain Assessment Details Patient Name: [name] Date of Service: 05/02/2016 10:45 AM Medical Record Number: [id number] Patient Account Number: [id number] Date of Birth/Sex: [birth year] (70 y.o. Male) Treating RN: [name], RN, [name], [place] Sink Primary Care Physician: Oretha Milch Other Clinician: Referring Physician: Oretha Milch Treating Physician/Extender: Rudene Re in Treatment: 17 Active Problems Location of Pain Severity and Description of Pain Patient Has Paino No Site Locations With Dressing Change: No Pain Management and Medication Current Pain Management: Electronic Signature(s) Signed: 05/02/2016 5:30:03 PM By: Elpidio Eric [name], RN Entered By: Elpidio Eric on 05/02/2016 10:49:36 [name], [name] ([id number]) -------------------------------------------------------------------------------- Wound Assessment Details Patient Name: [name] Date of Service: 05/02/2016 10:45 AM Medical Record Number: [id number] Patient Account Number: [id number] Date of Birth/Sex: 04-20-1946 (70 y.o. Male) Treating RN: [name], RN, [name], [place] Sink Primary Care Physician: Oretha Milch Other Clinician: Referring Physician: Oretha Milch Treating Physician/Extender: Rudene Re in Treatment: 17 Wound Status Wound Number: 1 Primary Venous Leg Ulcer Etiology: Wound Location: Left Lower Leg Wound Open Wounding Event: Gradually  Appeared Status: Date Acquired: 12/31/2014 Comorbid Cataracts, Chronic Obstructive Weeks Of Treatment: 17 History: Pulmonary Disease (COPD), Sleep Clustered Wound: No Apnea, Angina, Congestive Heart Failure, Hypertension, Type II Diabetes, Gout, Osteoarthritis,  Neuropathy Photos Photo Uploaded By: Elpidio Eric on 05/02/2016 17:27:36 Wound Measurements Length: (cm) 2 Width: (cm) 1.8 Depth: (cm) 0.1 Area: (cm) 2.827 Volume: (cm) 0.283 % Reduction in Area: 88.5% % Reduction in Volume: 88.4% Epithelialization: Medium (34-66%) Tunneling: No Undermining: No Wound Description Classification: Partial Thickness Foul Odor Diabetic Severity [name] [address]): Grade 1 Wound Margin: Flat and Intact Exudate Amount: Large Exudate Type: Serosanguineous Exudate Color: red, brown After Cleansing: No Wound Bed Granulation Amount: Large (67-100%) Exposed Structure [place], [name] ([id number]) Granulation Quality: Pink Fascia Exposed: No Necrotic Amount: None Present (0%) Fat Layer Exposed: No Tendon Exposed: No Muscle Exposed: No Joint Exposed: No Bone Exposed: No Limited to Skin Breakdown Periwound Skin Texture Texture Color No Abnormalities Noted: No No Abnormalities Noted: No Localized Edema: Yes Erythema: Yes Erythema Location: Circumferential Moisture No Abnormalities Noted: No Temperature / Pain Moist: Yes Temperature: No Abnormality Tenderness on Palpation: Yes Wound Preparation Ulcer Cleansing: Other: soap and water, Topical Anesthetic Applied: Other: lidocaine 4%, Electronic Signature(s) Signed: 05/02/2016 5:30:03 PM By: Elpidio Eric [name], RN Entered By: Elpidio Eric on 05/02/2016 11:21:22 [name] ([id number]) -------------------------------------------------------------------------------- Wound Assessment Details Patient Name: [name] Date of Service: 05/02/2016 10:45 AM Medical Record Number: [id number] Patient Account Number: [id number] Date of  Birth/Sex: November 26, 1945 (70 y.o. Male) Treating RN: [name], RN, [name], Rita Primary Care Physician: Oretha Milch Other Clinician: Referring Physician: Oretha Milch Treating Physician/Extender: Rudene Re in Treatment: 17 Wound Status Wound Number: 2 Primary Venous Leg Ulcer Etiology: Wound Location: Right Lower Leg Wound Open Wounding Event: Gradually Appeared Status: Date Acquired: 12/31/2014 Comorbid Cataracts, Chronic Obstructive Weeks Of Treatment: 17 History: Pulmonary Disease (COPD), Sleep Clustered Wound: No Apnea, Angina, Congestive Heart Failure, Hypertension, Type II Diabetes, Gout, Osteoarthritis, Neuropathy Photos Photo Uploaded By: Elpidio Eric on 05/02/2016 17:27:37 Wound Measurements Length: (cm) 1 Width: (cm) 1.8 Depth: (cm) 0.1 Area: (cm) 1.414 Volume: (cm) 0.141 % Reduction in Area: -499.2% % Reduction in Volume: -487.5% Epithelialization: Large (67-100%) Tunneling: No Undermining: No Wound Description Classification: Partial Thickness Foul Odor Aft Diabetic Severity [name] [address]): Grade 1 Albo, Kindred V. ([id number]) er Cleansing: No Wound Margin: Flat and Intact Exudate Amount: Medium Exudate Type: Serous Exudate Color: amber Wound Bed Granulation Amount: Large (67-100%) Exposed Structure Granulation Quality: Red Fascia Exposed: No Necrotic Amount: None Present (0%) Fat Layer Exposed: No Tendon Exposed: No Muscle Exposed: No Joint Exposed: No Bone Exposed: No Limited to Skin Breakdown Periwound Skin Texture Texture Color No Abnormalities Noted: No No Abnormalities Noted: No Localized Edema: Yes Erythema: Yes Erythema Location: Circumferential Moisture No Abnormalities Noted: No Temperature / Pain Maceration: No Temperature: No Abnormality Moist: Yes Tenderness on Palpation: Yes Wound Preparation Ulcer Cleansing: Other: soap and water, Topical Anesthetic Applied: None Electronic Signature(s) Signed: 05/02/2016 5:30:03 PM By:  Elpidio Eric [name], RN Entered By: Elpidio Eric on 05/02/2016 11:21:53 [name] ([id number]) -------------------------------------------------------------------------------- Wound Assessment Details Patient Name: [name] Date of Service: 05/02/2016 10:45 AM Medical Record Number: [id number] Patient Account Number: [id number] Date of Birth/Sex: 08-06-1946 (70 y.o. Male) Treating RN: [name], RN, [name], [place] Sink Primary Care Physician: Oretha Milch Other Clinician: Referring Physician: Oretha Milch Treating Physician/Extender: Rudene Re in Treatment: 17 Wound Status Wound Number: 3 Primary Pressure Ulcer Etiology: Wound Location: Right Calcaneus Wound Open Wounding Event: Pressure Injury Status: Date Acquired: 10/31/2015 Comorbid Cataracts, Chronic Obstructive Weeks Of Treatment: 17 History: Pulmonary Disease (COPD), Sleep Clustered Wound: No Apnea, Angina, Congestive Heart Failure, Hypertension, Type II Diabetes, Gout, Osteoarthritis, Neuropathy Photos Photo Uploaded  By: Elpidio Eric on 05/02/2016 17:27:57 Wound Measurements Length: (cm) 1.5 Width: (cm) 3 Depth: (cm) 0.2 Area: (cm) 3.534 Volume: (cm) 0.707 % Reduction in Area: 50% % Reduction in Volume: 50% Epithelialization: None Tunneling: No Undermining: No Wound Description Classification: Category/Stage III Diabetic Severity [name] [address]): Grade 1 [name], [name] ([id number]) Foul Odor After Cleansing: No Wound Margin: Flat and Intact Exudate Amount: Large Exudate Type: Serous Exudate Color: amber Wound Bed Granulation Amount: Small (1-33%) Exposed Structure Granulation Quality: Red, Pink Fascia Exposed: No Necrotic Amount: Large (67-100%) Fat Layer Exposed: No Necrotic Quality: Adherent Slough Tendon Exposed: No Muscle Exposed: No Joint Exposed: No Bone Exposed: No Limited to Skin Breakdown Periwound Skin Texture Texture Color No Abnormalities Noted: No No Abnormalities Noted:  No Localized Edema: Yes Erythema: Yes Erythema Location: Circumferential Moisture No Abnormalities Noted: No Temperature / Pain Maceration: Yes Temperature: No Abnormality Moist: Yes Tenderness on Palpation: Yes Wound Preparation Ulcer Cleansing: Rinsed/Irrigated with Saline Topical Anesthetic Applied: Other: lidocaine 4%, Electronic Signature(s) Signed: 05/02/2016 5:30:03 PM By: Elpidio Eric [name], RN Entered By: Elpidio Eric on 05/02/2016 11:23:24 [name] ([id number]) -------------------------------------------------------------------------------- Wound Assessment Details Patient Name: [name] Date of Service: 05/02/2016 10:45 AM Medical Record Number: [id number] Patient Account Number: [id number] Date of Birth/Sex: [birth year] (70 y.o. Male) Treating RN: [name], RN, [name], Rita Primary Care Physician: Oretha Milch Other Clinician: Referring Physician: Oretha Milch Treating Physician/Extender: Rudene Re in Treatment: 17 Wound Status Wound Number: 6 Primary Pressure Ulcer Etiology: Wound Location: Right Foot - Lateral Wound Open Wounding Event: Pressure Injury Status: Date Acquired: 02/16/2016 Comorbid Cataracts, Chronic Obstructive Weeks Of Treatment: 10 History: Pulmonary Disease (COPD), Sleep Clustered Wound: No Apnea, Angina, Congestive Heart Failure, Hypertension, Type II Diabetes, Gout, Osteoarthritis, Neuropathy Photos Photo Uploaded By: Elpidio Eric on 05/02/2016 17:27:57 Wound Measurements Length: (cm) 0.3 Width: (cm) 0.3 Depth: (cm) 0.1 Area: (cm) 0.071 Volume: (cm) 0.007 % Reduction in Area: 54.8% % Reduction in Volume: 56.3% Epithelialization: Medium (34-66%) Tunneling: No Wound Description Classification: Category/Stage II Foul Odor A Diabetic Severity ([place]): Grade 1 Wound Margin: Flat and Intact Exudate Amount: Large Exudate Type: Serous Exudate Color: amber fter Cleansing: No Wound Bed Granulation Amount: Small  (1-33%) Exposed Structure [name], [name] ([id number]) Granulation Quality: Pink Fascia Exposed: No Necrotic Amount: Large (67-100%) Fat Layer Exposed: No Necrotic Quality: Adherent Slough Tendon Exposed: No Muscle Exposed: No Joint Exposed: No Bone Exposed: No Limited to Skin Breakdown Periwound Skin Texture Texture Color No Abnormalities Noted: No No Abnormalities Noted: No Callus: No Atrophie Blanche: No Crepitus: No Cyanosis: No Excoriation: No Ecchymosis: No Fluctuance: No Erythema: No Friable: No Hemosiderin Staining: No Induration: No Mottled: No Localized Edema: No Pallor: No Rash: No Rubor: No Scarring: No Temperature / Pain Moisture Temperature: No Abnormality No Abnormalities Noted: No Tenderness on Palpation: Yes Dry / Scaly: No Maceration: No Moist: Yes Wound Preparation Ulcer Cleansing: Rinsed/Irrigated with Saline Topical Anesthetic Applied: Other: lidocaine 4%, Electronic Signature(s) Signed: 05/02/2016 5:30:03 PM By: Elpidio Eric [name], RN Entered By: Elpidio Eric on 05/02/2016 11:24:06 [name] ([id number]) -------------------------------------------------------------------------------- Vitals Details Patient Name: [name] Date of Service: 05/02/2016 10:45 AM Medical Record Number: [id number] Patient Account Number: [id number] Date of Birth/Sex: November 07, 1946 (70 y.o. Male) Treating RN: [name], RN, [name], [place] Sink Primary Care Physician: Oretha Milch Other Clinician: Referring Physician: Oretha Milch Treating Physician/Extender: Rudene Re in Treatment: 17 Vital Signs Time Taken: 10:49 Temperature (F): 97.8 Height (in): 70 Pulse (bpm): 55 Weight (lbs): 235 Respiratory Rate (breaths/min): 21  Body Mass Index (BMI): 33.7 Blood Pressure (mmHg): 126/58 Reference Range: 80 - 120 mg / dl Electronic Signature(s) Signed: 05/02/2016 5:30:03 PM By: Elpidio EricAfful, Rita [name], RN Entered By: Elpidio EricAfful, Rita on 05/02/2016 10:53:01

## 2016-05-03 NOTE — Progress Notes (Signed)
[name], [name] ([id number]) Visit Report for 05/02/2016 Chief Complaint Document Details Patient Name: [name], [name] Date of Service: 05/02/2016 10:45 AM Medical Record Number: [id number] Patient Account Number: [id number] Date of Birth/Sex: [birth year] (70 y.o. Male) Treating RN: [name], RN, [name], [place] Sink Primary Care Physician: Oretha Milch Other Clinician: Referring Physician: Oretha Milch Treating Physician/Extender: Rudene Re in Treatment: 17 Information Obtained from: Patient Chief Complaint Patient is at the clinic for treatment of an open pressure ulcer the left upper thigh and gluteal region and the right heel with bilateral swelling of the legs all of it which is going on for over a year Electronic Signature(s) Signed: 05/02/2016 11:46:03 AM By: Evlyn Kanner MD, FACS Entered By: Evlyn Kanner on 05/02/2016 11:46:02 [name] ([id number]) -------------------------------------------------------------------------------- Debridement Details Patient Name: [name] Date of Service: 05/02/2016 10:45 AM Medical Record Number: [id number] Patient Account Number: [id number] Date of Birth/Sex: Dec 23, 1945 (70 y.o. Male) Treating RN: [name], RN, [name], Rita Primary Care Physician: Oretha Milch Other Clinician: Referring Physician: Oretha Milch Treating Physician/Extender: Rudene Re in Treatment: 17 Debridement Performed for Wound #3 Right Calcaneus Assessment: Performed By: Physician Evlyn Kanner, MD Debridement: Debridement Pre-procedure Yes Verification/Time Out Taken: Start Time: 11:22 Pain Control: Lidocaine 4% Topical Solution Level: Skin/Subcutaneous Tissue Total Area Debrided (L x 1.5 (cm) x 3 (cm) = 4.5 (cm) W): Tissue and other Viable, Non-Viable, Eschar, Fibrin/Slough, Subcutaneous material debrided: Instrument: Curette Bleeding: Minimum Hemostasis Achieved: Pressure End Time: 11:27 Procedural Pain: 0 Post Procedural Pain: 0 Response  to Treatment: Procedure was tolerated well Post Debridement Measurements of Total Wound Length: (cm) 1.5 Stage: Category/Stage III Width: (cm) 3 Depth: (cm) 0.2 Volume: (cm) 0.707 Post Procedure Diagnosis Same as Pre-procedure Electronic Signature(s) Signed: 05/02/2016 11:45:56 AM By: Evlyn Kanner MD, FACS Signed: 05/02/2016 5:30:03 PM By: Elpidio Eric [name], RN Entered By: Evlyn Kanner on 05/02/2016 11:45:56 [name] ([id number]) -------------------------------------------------------------------------------- HPI Details Patient Name: [name] Date of Service: 05/02/2016 10:45 AM Medical Record Number: [id number] Patient Account Number: [id number] Date of Birth/Sex: 01-25-1946 (70 y.o. Male) Treating RN: [name], RN, [name], [place] Sink Primary Care Physician: Oretha Milch Other Clinician: Referring Physician: Oretha Milch Treating Physician/Extender: Rudene Re in Treatment: 17 History of Present Illness Location: ulcerated area on the right heel, left gluteal region and thigh and then bilateral lower extremities Quality: Patient reports experiencing a sharp pain to affected area(s). Severity: Patient states wound are getting worse. Duration: Patient has had the wound for > 12 months prior to seeking treatment at the wound center Timing: Pain in wound is constant (hurts all the time) Context: The wound appeared gradually over time Modifying Factors: Other treatment(s) tried include: he sees his heart doctor and his primary care doctor Associated Signs and Symptoms: patient has not been able to walk for over a year now HPI Description: 70 year old gentleman with a known history of hypertension, diabetes, obstructive sleep apnea, COPD, diastolic CHF, coronary artery disease was admitted to the hospital with sepsis from an ulcer of the right heel and was treated there in October 2016. he also has chronic bilateral lower extremity edema and lymphedema. He had received  vancomycin, Zosyn and at that stage and x-rays showed hardware in the right ankle but no evidence of osteomyelitis. he was a former smoker. he was also treated with Augmentin orally for 10 days. He is either bedbound or wheelchair-bound and does not ambulate by himself. 01/19/2016 -- he has not been seen here for 3 weeks and this was because  he was admitted to [facility] between 223 and 01/08/2016 for sepsis, UTI and pneumonia involving the left lung. he was treated with IV vancomycin and Zosyn and then meropenem. Was discharged home on oral Bactrim for 2 weeks none of his vascular test or x-rays were done and we will reorder these. 01/26/2016 -- he has not yet done the x-ray of his foot and is vascular tests are still pending. I have asked him to work on these with his nursing home staff. Addendum: he has got an x-ray of the right foot done which shows that his osteopenia but no specific ostial lysis or abnormal periosteal reaction. Final impression was degenerative changes with dorsal foot soft tissue swelling. 02/16/2016 -- lower extremity arterial duplex examination shows a 50-99% stenosis of the right tibioperoneal trunk. He had biphasic flow in the right SFA, popliteal and tibioperoneal trunk. Left-sided he had triphasic flow throughout 02/29/2016 -- he is awaiting his vascular opinion with Dr. Gilda Crease which is to be done on April 24 03/08/2016 -- he has not kept his appointment with Dr. Gilda Crease on April 24 and does not seem to know what happened about this. it's difficult to gauge whether he is in full control of his mental faculties as at times he is extremely rude to the nursing staff. 03/22/2016 -- the patient was seen by Dr. 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 Recommended that the patient had severe atherosclerotic changes of both lower  extremities associated with ulceration and tissue loss of the foot. This is a limb threatening ischemia and the patient was recommended to undergo Macinnes, Wilburt V. ([id number]) angiography of the lower extremity with a hope for intervention for limb salvage. Patient agreed and will proceed to angiography. He was admitted to the hospital between May 2 and 03/15/2016 - he underwent induction of a catheter into his right lower extremity and third order catheter placement with contrast injection to the right lower extremity for distal runoff. Percutaneous transluminal angioplasty of the right superficial femoral and popliteal arteries were done. He also had a right peroneal angioplasty. Patient had a postoperative hematoma and was admitted for observation and in the next 24 hours he was very agitated and combative and had to be seen by psychiatric. He had a follow-up with Dr. Gilda Crease in 3 weeks. 04/18/2016 -- notes reviewed from the vascular office where he was seen for his postop visit after the PTA of the right SFA and popliteal arteries on 03/12/2016. He underwent an ABI which showed right ABI to be more than 1.3 and left to be more than 1 more than 1.3, great toe and PPG waveforms are decreased bilaterally. No additional intervention indicated at this time and the patient was to follow-up in 3 months with an ABI and bilateral lower extremity duplex study. 05/02/2016 -- he complains that his compression stockings are causing him a lot of discomfort and he is not happy wearing them. Electronic Signature(s) Signed: 05/02/2016 11:46:31 AM By: Evlyn Kanner MD, FACS Entered By: Evlyn Kanner on 05/02/2016 11:46:30 [name] ([id number]) -------------------------------------------------------------------------------- Physical Exam Details Patient Name: [name] Date of Service: 05/02/2016 10:45 AM Medical Record Number: [id number] Patient Account Number: [id number] Date of  Birth/Sex: 23-Jun-1946 (70 y.o. Male) Treating RN: [name], RN, [name], [place] Sink Primary Care Physician: Oretha Milch Other Clinician: Referring Physician: Oretha Milch Treating Physician/Extender: Rudene Re in Treatment: 17 Constitutional .  Pulse regular. Respirations normal and unlabored. Afebrile. . Eyes Nonicteric. Reactive to light. Ears, Nose, Mouth, and Throat Lips, teeth, and gums WNL.Marland Kitchen Moist mucosa without lesions. Neck supple and nontender. No palpable supraclavicular or cervical adenopathy. Normal sized without goiter. Respiratory WNL. No retractions.. Cardiovascular Pedal Pulses WNL. No clubbing, cyanosis or edema. Lymphatic No adneopathy. No adenopathy. No adenopathy. Musculoskeletal Adexa without tenderness or enlargement.. Digits and nails w/o clubbing, cyanosis, infection, petechiae, ischemia, or inflammatory conditions.. Integumentary (Hair, Skin) No suspicious lesions. No crepitus or fluctuance. No peri-wound warmth or erythema. No masses.Marland Kitchen Psychiatric Judgement and insight Intact.. No evidence of depression, anxiety, or agitation.. Notes both lower extremity continue to have less erythema but there is superficial micro-ulceration and some oozing lymph fluid from his legs. The wound on the right lateral foot continues to have superficial eschar but is very tender. The right medial heel near the calcaneum was sharply debrided with #3 curet and a lot of surrounding eschar subcutaneous debris was removed and brisk bleeding controlled with pressure Electronic Signature(s) Signed: 05/02/2016 11:47:45 AM By: Evlyn Kanner MD, FACS Entered By: Evlyn Kanner on 05/02/2016 11:47:44 [name] ([id number]) -------------------------------------------------------------------------------- Physician Orders Details Patient Name: [name] Date of Service: 05/02/2016 10:45 AM Medical Record Number: [id number] Patient Account Number: [id number] Date of Birth/Sex:  [birth year] (70 y.o. Male) Treating RN: [name], RN, [name], [place] Sink Primary Care Physician: Oretha Milch Other Clinician: Referring Physician: Oretha Milch Treating Physician/Extender: Rudene Re in Treatment: 57 Verbal / Phone Orders: Yes Clinician: [name], RN, [name], Rita Read Back and Verified: Yes Diagnosis Coding Wound Cleansing Wound #1 Left Lower Leg o May Shower, gently pat wound dry prior to applying new dressing. Wound #2 Right Lower Leg o May Shower, gently pat wound dry prior to applying new dressing. Wound #3 Right Calcaneus o May Shower, gently pat wound dry prior to applying new dressing. Wound #6 Right,Lateral Foot o May Shower, gently pat wound dry prior to applying new dressing. Anesthetic Wound #1 Left Lower Leg o Topical Lidocaine 4% cream applied to wound bed prior to debridement - for clinic use only o Topical Lidocaine 4% cream applied to wound bed prior to debridement - for clinic use only Wound #3 Right Calcaneus o Topical Lidocaine 4% cream applied to wound bed prior to debridement - for clinic use only Wound #6 Right,Lateral Foot o Topical Lidocaine 4% cream applied to wound bed prior to debridement - for clinic use only o Topical Lidocaine 4% cream applied to wound bed prior to debridement - for clinic use only Skin Barriers/Peri-Wound Care Wound #3 Right Calcaneus o Skin Prep Wound #6 Right,Lateral Foot o Skin Prep Primary Wound Dressing Wound #1 Left Lower Leg o Aquacel Ag - Please wet with saline before removing Wound #2 Right Lower Leg Kugelman, Elion V. ([id number]) o Aquacel Ag - Please wet with saline before removing Wound #3 Right Calcaneus o Aquacel Ag - Please wet with saline before removing Wound #6 Right,Lateral Foot o Aquacel Ag - Please wet with saline before removing Secondary Dressing Wound #1 Left Lower Leg o ABD pad o Conform/Kerlix Wound #2 Right Lower Leg o ABD pad o Conform/Kerlix Wound  #3 Right Calcaneus o Boardered Foam Dressing Wound #6 Right,Lateral Foot o Boardered Foam Dressing Dressing Change Frequency Wound #1 Left Lower Leg o Change dressing every day. - to be changed daily by SNF RN Wound #2 Right Lower Leg o Change dressing every day. - to be changed daily by SNF RN Wound #3 Right  Calcaneus o Change dressing every day. - to be changed daily by SNF RN Wound #6 Right,Lateral Foot o Change dressing every day. - to be changed daily by SNF RN Follow-up Appointments Wound #1 Left Lower Leg o Return Appointment in 1 week. Wound #2 Right Lower Leg o Return Appointment in 1 week. Wound #3 Right Calcaneus o Return Appointment in 1 week. Wound #6 Right,Lateral Foot o Return Appointment in 1 week. [name], [name] Ethen V. ([id number]) Edema Control Wound #1 Left Lower Leg o Elevate legs to the level of the heart and pump ankles as often as possible o Support Garment 20-30 mm/Hg pressure to: - Please place these on in the morning before he gets up and take off only at shower time and at bedtime. Nurse to assist. Wound #2 Right Lower Leg o Elevate legs to the level of the heart and pump ankles as often as possible o Support Garment 20-30 mm/Hg pressure to: - Please place these on in the morning before he gets up and take off only at shower time and at bedtime. Nurse to assist. Wound #6 Right,Lateral Foot o Patient to wear own Juxtalite/Juzo compression garment. - Nursing home to try and get him these. Patient says the regular socks compression is too tight and hurts. o Elevate legs to the level of the heart and pump ankles as often as possible o Support Garment 20-30 mm/Hg pressure to: - Please place these on in the morning before he gets up and take off only at shower time and at bedtime. Nurse to assist. Off-Loading Wound #1 Left Lower Leg o Turn and reposition every 2 hours o Other: - sage boots at night and float heel when  lying in bed Wound #2 Right Lower Leg o Turn and reposition every 2 hours o Other: - sage boots at night and float heel when lying in bed Wound #3 Right Calcaneus o Turn and reposition every 2 hours o Other: - sage boots at night and float heel when lying in bed Wound #6 Right,Lateral Foot o Turn and reposition every 2 hours o Other: - sage boots at night and float heel when lying in bed Additional Orders / Instructions Wound #1 Left Lower Leg o Increase protein intake. Wound #2 Right Lower Leg o Increase protein intake. Wound #3 Right Calcaneus [name], [name] ([id number]) o Increase protein intake. Wound #6 Right,Lateral Foot o Increase protein intake. Medications-please add to medication list. Wound #1 Left Lower Leg o Other: - Vitamin C, Vitamin A, Zinc, multivitamin Wound #2 Right Lower Leg o Other: - Vitamin C, Vitamin A, Zinc, multivitamin Wound #3 Right Calcaneus o Other: - Vitamin C, Vitamin A, Zinc, multivitamin Wound #6 Right,Lateral Foot o Other: - Vitamin C, Vitamin A, Zinc, multivitamin Electronic Signature(s) Signed: 05/02/2016 4:59:18 PM By: Evlyn Kanner MD, FACS Signed: 05/02/2016 5:30:03 PM By: Elpidio Eric [name], RN Entered By: Elpidio Eric on 05/02/2016 11:28:41 [name] ([id number]) -------------------------------------------------------------------------------- Problem List Details Patient Name: [name] Date of Service: 05/02/2016 10:45 AM Medical Record Number: [id number] Patient Account Number: [id number] Date of Birth/Sex: 04/25/1946 (70 y.o. Male) Treating RN: [name], RN, [name], [place] Sink Primary Care Physician: Oretha Milch Other Clinician: Referring Physician: Oretha Milch Treating Physician/Extender: Rudene Re in Treatment: 17 Active Problems ICD-10 Encounter Code Description Active Date Diagnosis E11.621 Type 2 diabetes mellitus with foot ulcer 01/01/2016 Yes L89.613 Pressure ulcer of right  heel, stage 3 01/01/2016 Yes E66.01 Morbid (severe) obesity due to excess calories 01/01/2016 Yes I89.0 Lymphedema,  not elsewhere classified 01/01/2016 Yes M70.871 Other soft tissue disorders related to use, overuse and 01/19/2016 Yes pressure, right ankle and foot 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/16/2016 Yes ulceration of heel and midfoot Inactive Problems Resolved Problems ICD-10 Code Description Active Date Resolved Date L89.322 Pressure ulcer of left buttock, stage 2 01/01/2016 01/01/2016 Electronic Signature(s) Signed: 05/02/2016 11:44:25 AM By: Evlyn Kanner MD, FACS Entered By: Evlyn Kanner on 05/02/2016 11:44:24 [name], [name] ([id number]) [name], [name] VMarland Kitchen ([id number]) -------------------------------------------------------------------------------- Progress Note Details Patient Name: [name] Date of Service: 05/02/2016 10:45 AM Medical Record Number: [id number] Patient Account Number: [id number] Date of Birth/Sex: 06-Sep-1946 (70 y.o. Male) Treating RN: [name], RN, [name], Rita Primary Care Physician: Oretha Milch Other Clinician: Referring Physician: Oretha Milch Treating Physician/Extender: Rudene Re in Treatment: 17 Subjective Chief Complaint Information obtained from Patient Patient is at the clinic for treatment of an open pressure ulcer the left upper thigh and gluteal region and the right heel with bilateral swelling of the legs all of it which is going on for over a year History of Present Illness (HPI) The following HPI elements were documented for the patient's wound: Location: ulcerated area on the right heel, left gluteal region and thigh and then bilateral lower extremities Quality: Patient reports experiencing a sharp pain to affected area(s). Severity: Patient states wound are getting worse. Duration: Patient has had the wound for > 12 months prior to seeking treatment at the wound center Timing: Pain in wound is  constant (hurts all the time) Context: The wound appeared gradually over time Modifying Factors: Other treatment(s) tried include: he sees his heart doctor and his primary care doctor Associated Signs and Symptoms: patient has not been able to walk for over a year now 70 year old gentleman with a known history of hypertension, diabetes, obstructive sleep apnea, COPD, diastolic CHF, coronary artery disease was admitted to the hospital with sepsis from an ulcer of the right heel and was treated there in October 2016. he also has chronic bilateral lower extremity edema and lymphedema. He had received vancomycin, Zosyn and at that stage and x-rays showed hardware in the right ankle but no evidence of osteomyelitis. he was a former smoker. he was also treated with Augmentin orally for 10 days. He is either bedbound or wheelchair-bound and does not ambulate by himself. 01/19/2016 -- he has not been seen here for 3 weeks and this was because he was admitted to [facility] between 223 and 01/08/2016 for sepsis, UTI and pneumonia involving the left lung. he was treated with IV vancomycin and Zosyn and then meropenem. Was discharged home on oral Bactrim for 2 weeks none of his vascular test or x-rays were done and we will reorder these. 01/26/2016 -- he has not yet done the x-ray of his foot and is vascular tests are still pending. I have asked him to work on these with his nursing home staff. Addendum: he has got an x-ray of the right foot done which shows that his osteopenia but no specific ostial lysis or abnormal periosteal reaction. Final impression was degenerative changes with dorsal foot soft tissue swelling. 02/16/2016 -- lower extremity arterial duplex examination shows a 50-99% stenosis of the right tibioperoneal trunk. He had biphasic flow in the right SFA, popliteal and tibioperoneal trunk. Left-sided he had triphasic flow throughout DOROTEO, NICKOLSON  ([id number]) 02/29/2016 -- he is awaiting his vascular opinion with Dr. Gilda Crease which is to be done on April 24 03/08/2016 --  he has not kept his appointment with Dr. Gilda Crease on April 24 and does not seem to know what happened about this. it's difficult to gauge whether he is in full control of his mental faculties as at times he is extremely rude to the nursing staff. 03/22/2016 -- the patient was seen by Dr. 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 Recommended that the patient had severe atherosclerotic changes of both lower extremities associated with ulceration and tissue loss of the foot. This is a limb threatening ischemia and the patient was recommended to undergo angiography of the lower extremity with a hope for intervention for limb salvage. Patient agreed and will proceed to angiography. He was admitted to the hospital between May 2 and 03/15/2016 - he underwent induction of a catheter into his right lower extremity and third order catheter placement with contrast injection to the right lower extremity for distal runoff. Percutaneous transluminal angioplasty of the right superficial femoral and popliteal arteries were done. He also had a right peroneal angioplasty. Patient had a postoperative hematoma and was admitted for observation and in the next 24 hours he was very agitated and combative and had to be seen by psychiatric. He had a follow-up with Dr. Gilda Crease in 3 weeks. 04/18/2016 -- notes reviewed from the vascular office where he was seen for his postop visit after the PTA of the right SFA and popliteal arteries on 03/12/2016. He underwent an ABI which showed right ABI to be more than 1.3 and left to be more than 1 more than 1.3, great toe and PPG waveforms are decreased bilaterally. No additional intervention indicated at this time and the patient was to follow-up  in 3 months with an ABI and bilateral lower extremity duplex study. 05/02/2016 -- he complains that his compression stockings are causing him a lot of discomfort and he is not happy wearing them. Objective Constitutional Pulse regular. Respirations normal and unlabored. Afebrile. Vitals Time Taken: 10:49 AM, Height: 70 in, Weight: 235 lbs, BMI: 33.7, Temperature: 97.8 F, Pulse: 55 bpm, Respiratory Rate: 21 breaths/min, Blood Pressure: 126/58 mmHg. Eyes Nonicteric. Reactive to light. Ears, Nose, Mouth, and Throat Lips, teeth, and gums WNL.Marland Kitchen Moist mucosa without lesions. Neck supple and nontender. No palpable supraclavicular or cervical adenopathy. Normal sized without goiter. Respiratory Knepp, Zubayr V. ([id number]) WNL. No retractions.. Cardiovascular Pedal Pulses WNL. No clubbing, cyanosis or edema. Lymphatic No adneopathy. No adenopathy. No adenopathy. Musculoskeletal Adexa without tenderness or enlargement.. Digits and nails w/o clubbing, cyanosis, infection, petechiae, ischemia, or inflammatory conditions.Marland Kitchen Psychiatric Judgement and insight Intact.. No evidence of depression, anxiety, or agitation.. General Notes: both lower extremity continue to have less erythema but there is superficial micro-ulceration and some oozing lymph fluid from his legs. The wound on the right lateral foot continues to have superficial eschar but is very tender. The right medial heel near the calcaneum was sharply debrided with #3 curet and a lot of surrounding eschar subcutaneous debris was removed and brisk bleeding controlled with pressure Integumentary (Hair, Skin) No suspicious lesions. No crepitus or fluctuance. No peri-wound warmth or erythema. No masses.. Wound #1 status is Open. Original cause of wound was Gradually Appeared. The wound is located on the Left Lower Leg. The wound measures 2cm length x 1.8cm width x 0.1cm depth; 2.827cm^2 area and 0.283cm^3 volume. The wound is limited to  skin breakdown. There is no tunneling or undermining noted. There  is a large amount of serosanguineous drainage noted. The wound margin is flat and intact. There is large (67-100%) pink granulation within the wound bed. There is no necrotic tissue within the wound bed. The periwound skin appearance exhibited: Localized Edema, Moist, Erythema. The surrounding wound skin color is noted with erythema which is circumferential. Periwound temperature was noted as No Abnormality. The periwound has tenderness on palpation. Wound #2 status is Open. Original cause of wound was Gradually Appeared. The wound is located on the Right Lower Leg. The wound measures 1cm length x 1.8cm width x 0.1cm depth; 1.414cm^2 area and 0.141cm^3 volume. The wound is limited to skin breakdown. There is no tunneling or undermining noted. There is a medium amount of serous drainage noted. The wound margin is flat and intact. There is large (67-100%) red granulation within the wound bed. There is no necrotic tissue within the wound bed. The periwound skin appearance exhibited: Localized Edema, Moist, Erythema. The periwound skin appearance did not exhibit: Maceration. The surrounding wound skin color is noted with erythema which is circumferential. Periwound temperature was noted as No Abnormality. The periwound has tenderness on palpation. Wound #3 status is Open. Original cause of wound was Pressure Injury. The wound is located on the Right Calcaneus. The wound measures 1.5cm length x 3cm width x 0.2cm depth; 3.534cm^2 area and 0.707cm^3 volume. The wound is limited to skin breakdown. There is no tunneling or undermining noted. There is a large amount of serous drainage noted. The wound margin is flat and intact. There is small (1-33%) red, pink granulation within the wound bed. There is a large (67-100%) amount of necrotic tissue within the wound bed including Adherent Slough. The periwound skin appearance exhibited:  Localized Edema, Maceration, Moist, Erythema. The surrounding wound skin color is noted with erythema which is circumferential. Periwound temperature was noted as No Abnormality. The periwound has tenderness on [name], [name] ([id number]) palpation. Wound #6 status is Open. Original cause of wound was Pressure Injury. The wound is located on the Right,Lateral Foot. The wound measures 0.3cm length x 0.3cm width x 0.1cm depth; 0.071cm^2 area and 0.007cm^3 volume. The wound is limited to skin breakdown. There is no tunneling noted. There is a large amount of serous drainage noted. The wound margin is flat and intact. There is small (1-33%) pink granulation within the wound bed. There is a large (67-100%) amount of necrotic tissue within the wound bed including Adherent Slough. The periwound skin appearance exhibited: Moist. The periwound skin appearance did not exhibit: Callus, Crepitus, Excoriation, Fluctuance, Friable, Induration, Localized Edema, Rash, Scarring, Dry/Scaly, Maceration, Atrophie Blanche, Cyanosis, Ecchymosis, Hemosiderin Staining, Mottled, Pallor, Rubor, Erythema. Periwound temperature was noted as No Abnormality. The periwound has tenderness on palpation. 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 There was a Skin/Subcutaneous Tissue Debridement [id number]) debridement with total area of 4.5 sq cm performed by Evlyn KannerBritto, Harlee Eckroth, MD. with the following instrument(s): Curette to remove Viable and Non-Viable  tissue/material including Fibrin/Slough, Eschar, and Subcutaneous after achieving pain control using Lidocaine 4% Topical Solution. A time out was  conducted prior to the start of the procedure. A Minimum amount of bleeding was controlled with Pressure. The procedure was tolerated well with a pain level of 0 throughout and a pain level of 0 following the procedure. Post Debridement Measurements: 1.5cm length x 3cm width x 0.2cm depth; 0.707cm^3 volume. Post debridement Stage noted as Category/Stage III. Post procedure Diagnosis Wound #3: Same as Pre-Procedure [name], [name] ([id number]) Plan Wound Cleansing: Wound #1 Left Lower Leg: May Shower, gently pat wound dry prior to applying new dressing. Wound #2 Right Lower Leg: May Shower, gently pat wound dry prior to applying new dressing. Wound #3 Right Calcaneus: May Shower, gently pat wound dry prior to applying new dressing. Wound #6 Right,Lateral Foot: May Shower, gently pat wound dry prior to applying new dressing. Anesthetic: Wound #1 Left Lower Leg: Topical Lidocaine 4% cream applied to wound bed prior to debridement - for clinic use only Topical Lidocaine 4% cream applied to wound bed prior to debridement - for clinic use only Wound #3 Right Calcaneus: Topical Lidocaine 4% cream applied to wound bed prior to debridement - for clinic use only Wound #6 Right,Lateral Foot: Topical Lidocaine 4% cream applied to wound bed prior to debridement - for clinic use only Topical Lidocaine 4% cream applied to wound bed prior to debridement - for clinic use only Skin Barriers/Peri-Wound Care: Wound #3 Right Calcaneus: Skin Prep Wound #6 Right,Lateral Foot: Skin Prep Primary Wound Dressing: Wound #1 Left Lower Leg: Aquacel Ag - Please wet with saline before removing Wound #2 Right Lower Leg: Aquacel Ag - Please wet with saline before removing Wound #3 Right Calcaneus: Aquacel Ag - Please wet with saline before removing Wound #6  Right,Lateral Foot: Aquacel Ag - Please wet with saline before removing Secondary Dressing: Wound #1 Left Lower Leg: ABD pad Conform/Kerlix Wound #2 Right Lower Leg: ABD pad Conform/Kerlix Wound #3 Right Calcaneus: Boardered Foam Dressing Wound #6 Right,Lateral Foot: Boardered Foam Dressing Dressing Change Frequency: Wound #1 Left Lower Leg: Change dressing every day. - to be changed daily by SNF RN Wound #2 Right Lower Leg: [name], [name] ([id number]) Change dressing every day. - to be changed daily by SNF RN Wound #3 Right Calcaneus: Change dressing every day. - to be changed daily by SNF RN Wound #6 Right,Lateral Foot: Change dressing every day. - to be changed daily by SNF RN Follow-up Appointments: Wound #1 Left Lower Leg: Return Appointment in 1 week. Wound #2 Right Lower Leg: Return Appointment in 1 week. Wound #3 Right Calcaneus: Return Appointment in 1 week. Wound #6 Right,Lateral Foot: Return Appointment in 1 week. Edema Control: Wound #1 Left Lower Leg: Elevate legs to the level of the heart and pump ankles as often as possible Support Garment 20-30 mm/Hg pressure to: - Please place these on in the morning before he gets up and take off only at shower time and at bedtime. Nurse to assist. Wound #2 Right Lower Leg: Elevate legs to the level of the heart and pump ankles as often as possible Support Garment 20-30 mm/Hg pressure to: - Please place these on in the morning before he gets up and take off only at shower time and at bedtime. Nurse to assist. Wound #6 Right,Lateral Foot: Patient to wear own Juxtalite/Juzo compression garment. - Nursing home to try and get him these. Patient says the regular socks compression is too tight and hurts. Elevate legs to the level of the heart and pump ankles as often as possible Support Garment 20-30  mm/Hg pressure to: - Please place these on in the morning before he gets up and take off only at shower time and at bedtime.  Nurse to assist. Off-Loading: Wound #1 Left Lower Leg: Turn and reposition every 2 hours Other: - sage boots at night and float heel when lying in bed Wound #2 Right Lower Leg: Turn and reposition every 2 hours Other: - sage boots at night and float heel when lying in bed Wound #3 Right Calcaneus: Turn and reposition every 2 hours Other: - sage boots at night and float heel when lying in bed Wound #6 Right,Lateral Foot: Turn and reposition every 2 hours Other: - sage boots at night and float heel when lying in bed Additional Orders / Instructions: Wound #1 Left Lower Leg: Increase protein intake. Wound #2 Right Lower Leg: Increase protein intake. Wound #3 Right Calcaneus: Increase protein intake. Wound #6 Right,Lateral Foot: Increase protein intake. [name], [name] ([id number]) Medications-please add to medication list.: Wound #1 Left Lower Leg: Other: - Vitamin C, Vitamin A, Zinc, multivitamin Wound #2 Right Lower Leg: Other: - Vitamin C, Vitamin A, Zinc, multivitamin Wound #3 Right Calcaneus: Other: - Vitamin C, Vitamin A, Zinc, multivitamin Wound #6 Right,Lateral Foot: Other: - Vitamin C, Vitamin A, Zinc, multivitamin I have recommended: 1. Aquacell AG to his right heel. The patient also has a wound on the right lateral foot. This will be treated with silver alginate 2. Constant off loading and also using Sage boot. 3. he has not been happy with his bilateral lower extremity compression stockings of the 20-30 minute variety. we will order him some juxta lites which is nursing or home will have to get him. 4. High-protein diet and multivitamins including vitamin C and zinc. Electronic Signature(s) Signed: 05/02/2016 11:49:20 AM By: Evlyn Kanner MD, FACS Entered By: Evlyn Kanner on 05/02/2016 11:49:20 [name], [name] VMarland Kitchen ([id number]) -------------------------------------------------------------------------------- SuperBill Details Patient Name: [name] Date of  Service: 05/02/2016 Medical Record Number: [id number] Patient Account Number: [id number] Date of Birth/Sex: 06/10/1946 (70 y.o. 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 Facility Procedures CPT4: Description Modifier Quantity Code [id number] 11042 - DEB SUBQ TISSUE 20 SQ CM/< 1 ICD-10 Description Diagnosis E11.621 Type 2 diabetes mellitus with foot ulcer L89.613 Pressure ulcer of right heel, stage 3 I89.0 Lymphedema, not elsewhere classified  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 ([id number]) Electronic Signature(s) Signed: 05/02/2016 11:49:34 AM By: Evlyn Kanner MD, FACS Entered By: Evlyn Kanner on 05/02/2016 11:49:34

## 2016-05-09 ENCOUNTER — Encounter: Payer: Medicare Other | Admitting: Surgery

## 2016-05-09 DIAGNOSIS — E11621 Type 2 diabetes mellitus with foot ulcer: Secondary | ICD-10-CM | POA: Diagnosis not present

## 2016-05-11 NOTE — Progress Notes (Signed)
[name] ([id number]) Visit Report for 05/09/2016 Arrival Information Details Patient Name: [name], [name] Date of Service: 05/09/2016 8:00 AM Medical Record Number: [id number] Patient Account Number: [id number] Date of Birth/Sex: [birth year] (70 y.o. Male) Treating RN: [name] Primary Care Physician: [name] Other Clinician: Referring Physician: [name] Treating Physician/Extender: [name]: 18 Visit Information History Since Last Visit All ordered tests and consults were completed: No Patient Arrived: Wheel Chair Added or deleted any medications: No Arrival Time: 07:57 Any new allergies or adverse reactions: No Accompanied By: self Had a fall or experienced change in No Transfer Assistance: EasyPivot activities of daily living that may affect Patient Lift risk of falls: Patient Identification Verified: Yes Signs or symptoms of abuse/neglect since last No Secondary Verification Process Yes visito Completed: Hospitalized since last visit: No Patient Requires Transmission- No Pain Present Now: No Based Precautions: Patient Has Alerts: Yes Patient Alerts: DM II Electronic Signature(s) Signed: 05/10/2016 5:29:01 PM By: [name] Entered By: [name] on 05/09/2016 07:57:55 Burbage, [name] ([id number]) -------------------------------------------------------------------------------- Clinic Level of Care Assessment Details Patient Name: [name] Date of Service: 05/09/2016 8:00 AM Medical Record Number: [id number] Patient Account Number: [id number] Date of Birth/Sex: Apr 08, 1946 (70 y.o. Male) Treating RN: [name] Primary Care Physician: [name] Other Clinician: Referring Physician: [name] Treating Physician/Extender: [name]: 18 Clinic Level of Care Assessment Items TOOL 4 Quantity Score X - Use when only an EandM is performed on FOLLOW-UP visit 1 0 ASSESSMENTS  - Nursing Assessment / Reassessment X - Reassessment of Co-morbidities (includes updates in patient status) 1 10 X - Reassessment of Adherence to [name] 1 5 ASSESSMENTS - Wound and Skin Assessment / Reassessment []  - Simple Wound Assessment / Reassessment - one wound 0 X - Complex Wound Assessment / Reassessment - multiple wounds 4 5 []  - Dermatologic / Skin Assessment (not related to wound area) 0 ASSESSMENTS - Focused Assessment X - Circumferential Edema Measurements - multi extremities 2 5 []  - Nutritional Assessment / Counseling / Intervention 0 []  - Lower Extremity Assessment (monofilament, tuning fork, pulses) 0 []  - Peripheral Arterial Disease Assessment (using hand held doppler) 0 ASSESSMENTS - Ostomy and/or Continence Assessment and Care []  - Incontinence Assessment and Management 0 []  - Ostomy Care Assessment and Management (repouching, etc.) 0 PROCESS - Coordination of Care []  - Simple Patient / Family Education for ongoing care 0 X - Complex (extensive) Patient / Family Education for ongoing care 1 20 X - Staff obtains Chiropractor, Records, Test Results / Process Orders 1 10 X - Staff telephones HHA, Nursing Homes / Clarify orders / etc 1 10 []  - Routine Transfer to another Facility (non-emergent condition) 0 Frenkel, [name] ([id number]) []  - Routine Hospital Admission (non-emergent condition) 0 []  - New Admissions / Manufacturing engineer / Ordering NPWT, Apligraf, etc. 0 []  - Emergency Hospital Admission (emergent condition) 0 X - Simple Discharge Coordination 1 10 []  - Complex (extensive) Discharge Coordination 0 PROCESS - Special Needs []  - Pediatric / Minor Patient Management 0 []  - Isolation Patient Management 0 []  - Hearing / Language / Visual special needs 0 []  - Assessment of Community assistance (transportation, D/C planning, etc.) 0 []  - Additional assistance / Altered mentation 0 []  - Support Surface(s) Assessment (bed, cushion, seat, etc.)  0 INTERVENTIONS - Wound Cleansing / Measurement []  - Simple Wound Cleansing - one wound 0 X - Complex Wound Cleansing - multiple wounds 4 5 X -  Wound Imaging (photographs - any number of wounds) 1 5 []  - Wound Tracing (instead of photographs) 0 X - Simple Wound Measurement - one wound 1 5 X - Complex Wound Measurement - multiple wounds 2 5 INTERVENTIONS - Wound Dressings X - Small Wound Dressing one or multiple wounds 1 10 X - Medium Wound Dressing one or multiple wounds 1 15 X - Large Wound Dressing one or multiple wounds 2 20 X - Application of Medications - topical 1 5 []  - Application of Medications - injection 0 INTERVENTIONS - Miscellaneous []  - External ear exam 0 Brickel, Hulen V. ([id number]) []  - Specimen Collection (cultures, biopsies, blood, body fluids, etc.) 0 []  - Specimen(s) / Culture(s) sent or taken to Lab for analysis 0 X - Patient Transfer (multiple staff / Michiel SitesHoyer Lift / Similar devices) 1 10 []  - Simple Staple / Suture removal (25 or less) 0 []  - Complex Staple / Suture removal (26 or more) 0 []  - Hypo / Hyperglycemic Management (close monitor of Blood Glucose) 0 []  - Ankle / Brachial Index (ABI) - do not check if billed separately 0 X - Vital Signs 1 5 Has the patient been seen at the hospital within the last three years: Yes Total Score: 220 Level Of Care: New/Established - Level 5 Electronic Signature(s) Signed: 05/10/2016 5:29:01 PM By: [name] MullingPinkerton, Debra Entered By: [name] MullingPinkerton, Debra on 05/09/2016 10:20:57 [name] OttoLINDLEY, [name] ([id number]) -------------------------------------------------------------------------------- Encounter Discharge Information Details Patient Name: [name] OttoLINDLEY, [name] Date of Service: 05/09/2016 8:00 AM Medical Record Number: [id number] Patient Account Number: [id number] Date of Birth/Sex: Aug 05, 1946 [age] y.o. Male) Treating RN: [name] HaggisPinkerton, Debi Primary Care Physician: [name] MilchSMITH, [name] Other Clinician: Referring Physician: Oretha MilchSMITH, [name] Treating  Physician/Extender: [name] [name] in [name]: [id number] Encounter Discharge Information Items Discharge Pain Level: 0 Discharge Condition: Stable Ambulatory Status: Wheelchair Discharge Destination: Nursing Home Transportation: Private Auto Accompanied By: self Schedule Follow-up Appointment: Yes Medication Reconciliation completed and provided to Patient/Care Yes Maliq Pilley: Provided on Clinical Summary of Care: 05/09/2016 Form Type Recipient Paper Patient EL Electronic Signature(s) Signed: 05/09/2016 9:00:01 AM By: Gwenlyn PerkingMoore, Shelia Entered By: Gwenlyn PerkingMoore, Shelia on 05/09/2016 09:00:01 [name] OttoLINDLEY, [name] ([id number]) -------------------------------------------------------------------------------- Lower Extremity Assessment Details Patient Name: [name] OttoLINDLEY, [name] Date of Service: 05/09/2016 8:00 AM Medical Record Number: [id number] Patient Account Number: [id number] Date of Birth/Sex: Aug 05, 1946 [age] y.o. Male) Treating RN: [name] HaggisPinkerton, Debi Primary Care Physician: [name] MilchSMITH, [name] Other Clinician: Referring Physician: Oretha MilchSMITH, [name] Treating Physician/Extender: [name] [name] in [name]: 18 Edema Assessment Assessed: [Left: No] [Right: No] E[Left: dema] [Right: :] Calf Left: Right: Point of Measurement: 34 cm From Medial Instep 36.8 cm 37 cm Ankle Left: Right: Point of Measurement: 12 cm From Medial Instep 24 cm 23.4 cm Vascular Assessment Pulses: Posterior Tibial Dorsalis Pedis Palpable: [Left:Yes] [Right:Yes] Extremity colors, hair growth, and conditions: Extremity Color: [Left:Hyperpigmented] [Right:Hyperpigmented] Temperature of Extremity: [Left:Warm] [Right:Warm] Capillary Refill: [Left:< 3 seconds] [Right:< 3 seconds] Electronic Signature(s) Signed: 05/10/2016 5:29:01 PM By: [name] MullingPinkerton, Debra Entered By: [name] MullingPinkerton, Debra on 05/09/2016 08:13:26 Abernethy, [name] ([id number]) -------------------------------------------------------------------------------- Multi Wound Chart  Details Patient Name: [name] OttoLINDLEY, Roshon V. Date of Service: 05/09/2016 8:00 AM Medical Record Number: [id number] Patient Account Number: [id number] Date of Birth/Sex: Aug 05, 1946 [age] y.o. Male) Treating RN: [name] HaggisPinkerton, Debi Primary Care Physician: [name] MilchSMITH, [name] Other Clinician: Referring Physician: Oretha MilchSMITH, [name] Treating Physician/Extender: [name] [name] in [name]: 18 Vital Signs Height(in): 70 Pulse(bpm): 99 Weight(lbs): 235 Blood Pressure 142/76 (mmHg): Body Mass Index(BMI): 34 Temperature(F): 98.2 Respiratory Rate 20 (breaths/min): Photos: [1:No Photos] [2:No Photos] [3:No Photos] Wound  Location: [1:Left Lower Leg] [2:Right Lower Leg] [3:Right Calcaneus] Wounding Event: [1:Gradually Appeared] [2:Gradually Appeared] [3:Pressure Injury] Primary Etiology: [1:Venous Leg Ulcer] [2:Venous Leg Ulcer] [3:Pressure Ulcer] Comorbid History: [1:Cataracts, Chronic Obstructive Pulmonary Disease (COPD), Sleep Disease (COPD), Sleep Disease (COPD), Sleep Apnea, Angina, Congestive Heart Failure, Congestive Heart Failure, Congestive Heart Failure, Hypertension, Type II Diabetes,  Gout, Osteoarthritis, Neuropathy Osteoarthritis, Neuropathy Osteoarthritis, Neuropathy] [2:Cataracts, Chronic Obstructive Pulmonary Apnea, Angina, Hypertension, Type II Diabetes, Gout,] [3:Cataracts, Chronic Obstructive Pulmonary Apnea, Angina,  Hypertension, Type II Diabetes, Gout,] Date Acquired: [1:12/31/2014] [2:12/31/2014] [3:10/31/2015] [name] of [name]: [1:18] [2:18] [3:18] Wound Status: [1:Open] [2:Open] [3:Open] Measurements L x W x D 4x3x0.1 [2:3x3x0.1] [3:0.7x0.9x0.2] (cm) Area (cm) : [1:9.425] [2:7.069] [3:0.495] Volume (cm) : [1:0.942] [2:0.707] [3:0.099] % Reduction in Area: [1:61.50%] [2:-2895.30%] [3:93.00%] % Reduction in Volume: 61.60% [2:-2845.80%] [3:93.00%] Classification: [1:Partial Thickness] [2:Partial Thickness] [3:Category/Stage III] HBO Classification: [1:Grade 1] [2:Grade 1] [3:Grade  1] Exudate Amount: [1:Large] [2:Medium] [3:Large] Exudate Type: [1:Serosanguineous] [2:Serosanguineous] [3:Serosanguineous] Exudate Color: [1:red, brown] [2:red, brown] [3:red, brown] Wound Margin: [1:Flat and Intact] [2:Flat and Intact] [3:Flat and Intact] Granulation Amount: [1:Large (67-100%)] [2:Large (67-100%)] [3:Medium (34-66%)] Granulation Quality: [1:Pink] [2:Red] [3:Red, Pink] Necrotic Amount: [1:Small (1-33%)] [2:Small (1-33%)] [3:Medium (34-66%)] Necrotic Tissue: Eschar Eschar Adherent Slough Exposed Structures: Fascia: No Fascia: No Fascia: No Fat: No Fat: No Fat: No Tendon: No Tendon: No Tendon: No Muscle: No Muscle: No Muscle: No Joint: No Joint: No Joint: No Bone: No Bone: No Bone: No Limited to Skin Limited to Skin Limited to Skin Breakdown Breakdown Breakdown Epithelialization: Medium (34-66%) Medium (34-66%) None Periwound Skin Texture: Edema: Yes Edema: Yes Edema: Yes Periwound Skin Moist: Yes Moist: Yes Maceration: Yes Moisture: Maceration: No Moist: Yes Periwound Skin Color: Erythema: Yes Erythema: Yes Erythema: Yes Erythema Location: Circumferential Circumferential Circumferential Temperature: No Abnormality No Abnormality No Abnormality Tenderness on Yes Yes Yes Palpation: Wound Preparation: Ulcer Cleansing: Other: Ulcer Cleansing: Other: Ulcer Cleansing: soap and water soap and water Rinsed/Irrigated with Saline Topical Anesthetic Topical Anesthetic Applied: Other: lidocaine Applied: None Topical Anesthetic 4% Applied: Other: lidocaine 4% Wound Number: 6 N/A N/A Photos: No Photos N/A N/A Wound Location: Right Foot - Lateral N/A N/A Wounding Event: Pressure Injury N/A N/A Primary Etiology: Pressure Ulcer N/A N/A Comorbid History: Cataracts, Chronic N/A N/A Obstructive Pulmonary Disease (COPD), Sleep Apnea, Angina, Congestive Heart Failure, Hypertension, Type II Diabetes, Gout, Osteoarthritis, Neuropathy Date Acquired: 02/16/2016  N/A N/A [name] of [name]: 11 N/A N/A Wound Status: Open N/A N/A Measurements L x W x D 0.3x0.3x0.1 N/A N/A (cm) Area (cm) : 0.071 N/A N/A Volume (cm) : 0.007 N/A N/A % Reduction in Area: 54.80% N/A N/A % Reduction in Volume: 56.30% N/A N/A Classification: Category/Stage II N/A N/A HBO Classification: Grade 1 N/A N/A Bona, Forney V. ([id number]) Exudate Amount: Large N/A N/A Exudate Type: Serosanguineous N/A N/A Exudate Color: red, brown N/A N/A Wound Margin: Flat and Intact N/A N/A Granulation Amount: Small (1-33%) N/A N/A Granulation Quality: Pink N/A N/A Necrotic Amount: Large (67-100%) N/A N/A Necrotic Tissue: Eschar, Adherent Slough N/A N/A Exposed Structures: Fascia: No N/A N/A Fat: No Tendon: No Muscle: No Joint: No Bone: No Limited to Skin Breakdown Epithelialization: Medium (34-66%) N/A N/A Periwound Skin Texture: Edema: No N/A N/A Excoriation: No Induration: No Callus: No Crepitus: No Fluctuance: No Friable: No Rash: No Scarring: No Periwound Skin Moist: Yes N/A N/A Moisture: Maceration: No Dry/Scaly: No Periwound Skin Color: Atrophie Blanche: No N/A N/A Cyanosis: No Ecchymosis: No Erythema: No Hemosiderin Staining: No Mottled: No Pallor:  No Rubor: No Erythema Location: N/A N/A N/A Temperature: No Abnormality N/A N/A Tenderness on Yes N/A N/A Palpation: Wound Preparation: Ulcer Cleansing: N/A N/A Rinsed/Irrigated with Saline Topical Anesthetic Applied: Other: lidocaine 4% [name] Notes KYLLE, LALL ([id number]) Electronic Signature(s) Signed: 05/10/2016 5:29:01 PM By: [name] Entered By: [name] on 05/09/2016 08:25:42 [name] ([id number]) -------------------------------------------------------------------------------- Multi-Disciplinary Care Plan Details Patient Name: [name] Date of Service: 05/09/2016 8:00 AM Medical Record Number: [id number] Patient Account Number: [id number] Date of  Birth/Sex: 17-Nov-1945 (70 y.o. Male) Treating RN: [name] Primary Care Physician: [name] Other Clinician: Referring Physician: [name] Treating Physician/Extender: [name]: 20 Active Inactive Abuse / Safety / Falls / Self Care Management Nursing Diagnoses: Potential for falls Goals: Patient will remain injury free Date Initiated: 03/01/2016 Goal Status: Active Interventions: Assess fall risk on admission and as needed Notes: Nutrition Nursing Diagnoses: Imbalanced nutrition Goals: Patient/caregiver agrees to and verbalizes understanding of need to use nutritional supplements and/or vitamins as prescribed Date Initiated: 03/01/2016 Goal Status: Active Interventions: Assess patient nutrition upon admission and as needed per policy Notes: Orientation to the Wound Care Program Nursing Diagnoses: Knowledge deficit related to the wound healing center program Goals: Patient/caregiver will verbalize understanding of the [facility] [address] [name], [name] ([id number]) Date Initiated: 03/01/2016 Goal Status: Active Interventions: Provide education on orientation to the wound center Notes: Pain, Acute or Chronic Nursing Diagnoses: Pain, acute or chronic: actual or potential Potential alteration in comfort, pain Goals: Patient will verbalize adequate pain control and receive pain control interventions during procedures as needed Date Initiated: 03/01/2016 Goal Status: Active Patient/caregiver will verbalize adequate pain control between visits Date Initiated: 03/01/2016 Goal Status: Active Interventions: Assess comfort goal upon admission Complete pain assessment as per visit requirements Notes: Pressure Nursing Diagnoses: Knowledge deficit related to causes and risk factors for pressure ulcer development Knowledge deficit related to management of pressures ulcers Goals: Patient/caregiver will verbalize risk factors for  pressure ulcer development Date Initiated: 03/01/2016 Goal Status: Active Interventions: Assess offloading mechanisms upon admission and as needed Notes: Wound/Skin Impairment Nursing Diagnoses: COULTON, SCHLINK ([id number]) Impaired tissue integrity Goals: Ulcer/skin breakdown will have a volume reduction of 30% by week 4 Date Initiated: 03/01/2016 Goal Status: Active Ulcer/skin breakdown will have a volume reduction of 50% by week 8 Date Initiated: 03/01/2016 Goal Status: Active Ulcer/skin breakdown will have a volume reduction of 80% by week 12 Date Initiated: 03/01/2016 Goal Status: Active Interventions: Assess ulceration(s) every visit Notes: Electronic Signature(s) Signed: 05/10/2016 5:29:01 PM By: [name] Entered By: [name] on 05/09/2016 08:25:35 Zaldivar, [name] Kitchen ([id number]) -------------------------------------------------------------------------------- Pain Assessment Details Patient Name: [name] Date of Service: 05/09/2016 8:00 AM Medical Record Number: [id number] Patient Account Number: [id number] Date of Birth/Sex: 08-Jul-1946 (70 y.o. Male) Treating RN: [name] Primary Care Physician: [name] Other Clinician: Referring Physician: [name] Treating Physician/Extender: [name]: 57 Active Problems Location of Pain Severity and Description of Pain Patient Has Paino No Site Locations With Dressing Change: No Pain Management and Medication Current Pain Management: Electronic Signature(s) Signed: 05/10/2016 5:29:01 PM By: [name] Entered By: [name] on 05/09/2016 07:58:03 [name] ([id number]) -------------------------------------------------------------------------------- Patient/Caregiver Education Details Patient Name: [name] Date of Service: 05/09/2016 8:00 AM Medical Record Number: [id number] Patient Account Number: [id number] Date of Birth/Gender:  [birth year] (70 y.o. Male) Treating RN: [name] Primary Care Physician: [name] Other Clinician: Referring Physician: [name] Treating Physician/Extender: Meyer Russel  Cecilie Kicks in [name]: 18 Education Assessment Education Provided To: Patient Education Topics Provided Wound/Skin Impairment: Handouts: Other: change dressing as ordered Methods: Demonstration, Explain/Verbal Responses: State content correctly Electronic Signature(s) Signed: 05/10/2016 5:29:01 PM By: [name] Entered By: [name] on 05/09/2016 08:52:17 Scallon, Montrail [name] Kitchen ([id number]) -------------------------------------------------------------------------------- Wound Assessment Details Patient Name: [name] Date of Service: 05/09/2016 8:00 AM Medical Record Number: [id number] Patient Account Number: [id number] Date of Birth/Sex: 02/26/1946 (70 y.o. Male) Treating RN: [name] Primary Care Physician: [name] Other Clinician: Referring Physician: [name] Treating Physician/Extender: [name]: 18 Wound Status Wound Number: 1 Primary Venous Leg Ulcer Etiology: Wound Location: Left Lower Leg Wound Open Wounding Event: Gradually Appeared Status: Date Acquired: 12/31/2014 Comorbid Cataracts, Chronic Obstructive [name] Of [name]: 18 History: Pulmonary Disease (COPD), Sleep Clustered Wound: No Apnea, Angina, Congestive Heart Failure, Hypertension, Type II Diabetes, Gout, Osteoarthritis, Neuropathy Photos Photo Uploaded By: [name] on 05/09/2016 15:28:52 Wound Measurements Length: (cm) 4 Width: (cm) 3 Depth: (cm) 0.1 Area: (cm) 9.425 Volume: (cm) 0.942 % Reduction in Area: 61.5% % Reduction in Volume: 61.6% Epithelialization: Medium (34-66%) Tunneling: No Undermining: No Wound Description Classification: Partial Thickness Foul Odor A Diabetic Severity (Wagner): Grade 1 Wound Margin: Flat and Intact Exudate  Amount: Large Exudate Type: Serosanguineous Exudate Color: red, brown fter Cleansing: No Wound Bed Granulation Amount: Large (67-100%) Exposed Structure Guandique, Adonias V. ([id number]) Granulation Quality: Pink Fascia Exposed: No Necrotic Amount: Small (1-33%) Fat Layer Exposed: No Necrotic Quality: Eschar Tendon Exposed: No Muscle Exposed: No Joint Exposed: No Bone Exposed: No Limited to Skin Breakdown Periwound Skin Texture Texture Color No Abnormalities Noted: No No Abnormalities Noted: No Localized Edema: Yes Erythema: Yes Erythema Location: Circumferential Moisture No Abnormalities Noted: No Temperature / Pain Moist: Yes Temperature: No Abnormality Tenderness on Palpation: Yes Wound Preparation Ulcer Cleansing: Other: soap and water, Topical Anesthetic Applied: Other: lidocaine 4%, [name] Notes Wound #1 (Left Lower Leg) 1. Cleansed with: Cleanse wound with antibacterial soap and water 2. Anesthetic Topical Lidocaine 4% cream to wound bed prior to debridement 3. Peri-wound Care: Moisturizing lotion 4. Dressing Applied: Aquacel Ag 5. Secondary Dressing Applied ABD Pad Kerlix/Conform 7. Secured with Secretary/administrator) Signed: 05/10/2016 5:29:01 PM By: [name] Entered By: [name] on 05/09/2016 08:14:41 [name] ([id number]) -------------------------------------------------------------------------------- Wound Assessment Details Patient Name: [name] Date of Service: 05/09/2016 8:00 AM Medical Record Number: [id number] Patient Account Number: [id number] Date of Birth/Sex: 1946/10/05 (70 y.o. Male) Treating RN: [name] Primary Care Physician: [name] Other Clinician: Referring Physician: [name] Treating Physician/Extender: [name]: 18 Wound Status Wound Number: 2 Primary Venous Leg Ulcer Etiology: Wound Location: Right Lower Leg Wound Open Wounding Event: Gradually  Appeared Status: Date Acquired: 12/31/2014 Comorbid Cataracts, Chronic Obstructive [name] Of [name]: 18 History: Pulmonary Disease (COPD), Sleep Clustered Wound: No Apnea, Angina, Congestive Heart Failure, Hypertension, Type II Diabetes, Gout, Osteoarthritis, Neuropathy Photos Photo Uploaded By: [name] on 05/09/2016 15:29:17 Wound Measurements Length: (cm) 3 Width: (cm) 3 Depth: (cm) 0.1 Area: (cm) 7.069 Volume: (cm) 0.707 % Reduction in Area: -2895.3% % Reduction in Volume: -2845.8% Epithelialization: Medium (34-66%) Tunneling: No Undermining: No Wound Description Classification: Partial Thickness Foul Odor A Diabetic Severity (Wagner): Grade 1 Wound Margin: Flat and Intact Exudate Amount: Medium Exudate Type: Serosanguineous Exudate Color: red, brown fter Cleansing: No Wound Bed Granulation Amount: Large (67-100%) Exposed Structure Brosious, Dawayne V. ([id number]) Granulation Quality: Red Fascia Exposed: No Necrotic Amount: Small (1-33%)  Fat Layer Exposed: No Necrotic Quality: Eschar Tendon Exposed: No Muscle Exposed: No Joint Exposed: No Bone Exposed: No Limited to Skin Breakdown Periwound Skin Texture Texture Color No Abnormalities Noted: No No Abnormalities Noted: No Localized Edema: Yes Erythema: Yes Erythema Location: Circumferential Moisture No Abnormalities Noted: No Temperature / Pain Maceration: No Temperature: No Abnormality Moist: Yes Tenderness on Palpation: Yes Wound Preparation Ulcer Cleansing: Other: soap and water, Topical Anesthetic Applied: None [name] Notes Wound #2 (Right Lower Leg) 1. Cleansed with: Cleanse wound with antibacterial soap and water 2. Anesthetic Topical Lidocaine 4% cream to wound bed prior to debridement 3. Peri-wound Care: Moisturizing lotion 4. Dressing Applied: Aquacel Ag 5. Secondary Dressing Applied ABD Pad Kerlix/Conform 7. Secured with Secretary/administrator) Signed:  05/10/2016 5:29:01 PM By: [name] Entered By: [name] on 05/09/2016 08:15:38 [name] ([id number]) -------------------------------------------------------------------------------- Wound Assessment Details Patient Name: [name] Date of Service: 05/09/2016 8:00 AM Medical Record Number: [id number] Patient Account Number: [id number] Date of Birth/Sex: 1946-08-25 (70 y.o. Male) Treating RN: [name] Primary Care Physician: [name] Other Clinician: Referring Physician: [name] Treating Physician/Extender: [name]: 18 Wound Status Wound Number: 3 Primary Pressure Ulcer Etiology: Wound Location: Right Calcaneus Wound Open Wounding Event: Pressure Injury Status: Date Acquired: 10/31/2015 Comorbid Cataracts, Chronic Obstructive [name] Of [name]: 17 History: Pulmonary Disease (COPD), Sleep Clustered Wound: No Apnea, Angina, Congestive Heart Failure, Hypertension, Type II Diabetes, Gout, Osteoarthritis, Neuropathy Photos Photo Uploaded By: [name] on 05/09/2016 15:29:35 Wound Measurements Length: (cm) 0.7 Width: (cm) 0.9 Depth: (cm) 0.2 Area: (cm) 0.495 Volume: (cm) 0.099 % Reduction in Area: 93% % Reduction in Volume: 93% Epithelialization: None Tunneling: No Undermining: No Wound Description 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 ([id number]) Granulation Quality: Red, Pink Fascia Exposed: No Necrotic Amount: Medium (34-66%) Fat Layer Exposed: No Necrotic Quality: Adherent Slough Tendon Exposed: No Muscle Exposed: No Joint Exposed: No Bone Exposed: No Limited to Skin Breakdown Periwound Skin Texture Texture Color No  Abnormalities Noted: No No Abnormalities Noted: No Localized Edema: Yes Erythema: Yes Erythema Location: Circumferential Moisture No Abnormalities Noted: No Temperature / Pain Maceration: Yes Temperature: No Abnormality Moist: Yes Tenderness on Palpation: Yes Wound Preparation Ulcer Cleansing: Rinsed/Irrigated with Saline Topical Anesthetic Applied: Other: lidocaine 4%, [name] Notes Wound #3 (Right Calcaneus) 1. Cleansed with: Cleanse wound with antibacterial soap and water 2. Anesthetic Topical Lidocaine 4% cream to wound bed prior to debridement 3. Peri-wound Care: Skin Prep 4. Dressing Applied: Aquacel Ag 5. Secondary Dressing Applied Bordered Foam Dressing Electronic Signature(s) Signed: 05/10/2016 5:29:01 PM By: [name] Entered By: [name] on 05/09/2016 08:16:37 [name] ([id number]) -------------------------------------------------------------------------------- Wound Assessment Details Patient Name: [name] Date of Service: 05/09/2016 8:00 AM Medical Record Number: [id number] Patient Account Number: [id number] Date of Birth/Sex: [birth year] (70 y.o. Male) Treating RN: [name] Primary Care Physician: [name] Other Clinician: Referring Physician: [name] Treating Physician/Extender: [name]: 17 Wound Status Wound Number: 6 Primary Pressure Ulcer Etiology: Wound Location: Right Foot - Lateral Wound Open Wounding Event: Pressure Injury Status: Date Acquired: 02/16/2016 Comorbid Cataracts, Chronic Obstructive [name] Of [name]: 10 History: Pulmonary Disease (COPD), Sleep Clustered Wound: No Apnea, Angina, Congestive Heart Failure, Hypertension, Type II Diabetes, Gout, Osteoarthritis, Neuropathy Photos Photo Uploaded By: Ashok Cordia,  Debra on 05/09/2016 15:29:52 Wound Measurements Length: (cm) 0.3 Width: (cm) 0.3 Depth: (cm) 0.1 Area: (cm) 0.071 Volume: (cm) 0.007 % Reduction  in Area: 54.8% % Reduction in Volume: 56.3% Epithelialization: Medium (34-66%) Wound Description Classification: Category/Stage II Diabetic Severity [address]): Grade 1 Wound Margin: Flat and Intact Exudate Amount: Large Exudate Type: Serosanguineous Exudate Color: red, brown Foul Odor After Cleansing: No Wound Bed Granulation Amount: Small (1-33%) Exposed Structure Needs, Philopater V. ([id number]) Granulation Quality: Pink Fascia Exposed: No Necrotic Amount: Large (67-100%) Fat Layer Exposed: No Necrotic Quality: Eschar, Adherent Slough Tendon Exposed: No Muscle Exposed: No Joint Exposed: No Bone Exposed: No Limited to Skin Breakdown Periwound Skin Texture Texture Color No Abnormalities Noted: No No Abnormalities Noted: No Callus: No Atrophie Blanche: No Crepitus: No Cyanosis: No Excoriation: No Ecchymosis: No Fluctuance: No Erythema: No Friable: No Hemosiderin Staining: No Induration: No Mottled: No Localized Edema: No Pallor: No Rash: No Rubor: No Scarring: No Temperature / Pain Moisture Temperature: No Abnormality No Abnormalities Noted: No Tenderness on Palpation: Yes Dry / Scaly: No Maceration: No Moist: Yes Wound Preparation Ulcer Cleansing: Rinsed/Irrigated with Saline Topical Anesthetic Applied: Other: lidocaine 4%, [name] Notes Wound #6 (Right, Lateral Foot) 1. Cleansed with: Cleanse wound with antibacterial soap and water 2. Anesthetic Topical Lidocaine 4% cream to wound bed prior to debridement 3. Peri-wound Care: Skin Prep 4. Dressing Applied: Aquacel Ag 5. Secondary Dressing Applied Bordered Foam Dressing Electronic Signature(s) Signed: 05/10/2016 5:29:01 PM By: [name] Entered By: [name] on 05/09/2016 08:17:24 [name] ([id number]) [name], Opal Seth [name] ([id number]) -------------------------------------------------------------------------------- Vitals Details Patient Name: [name] Date of Service:  05/09/2016 8:00 AM Medical Record Number: [id number] Patient Account Number: [id number] Date of Birth/Sex: 02-24-1946 (70 y.o. Male) Treating RN: [name] Primary Care Physician: [name] Other Clinician: Referring Physician: [name] Treating Physician/Extender: [name]: 17 Vital Signs Time Taken: 07:58 Temperature (F): 98.2 Height (in): 70 Pulse (bpm): 99 Weight (lbs): 235 Respiratory Rate (breaths/min): 20 Body Mass Index (BMI): 33.7 Blood Pressure (mmHg): 142/76 Reference Range: 80 - 120 mg / dl Electronic Signature(s) Signed: 05/10/2016 5:29:01 PM By: [name] Entered By: [name] on 05/09/2016 08:00:48

## 2016-05-11 NOTE — Progress Notes (Signed)
COLUM, COLT ([id number]) Visit Report for 05/09/2016 Chief Complaint Document Details Patient Name: [name], [name] Date of Service: 05/09/2016 8:00 AM Medical Record Number: [id number] Patient Account Number: [id number] Date of Birth/Sex: 1946-07-01 (70 y.o. Male) Treating RN: [name] Primary Care Physician: [name] Other Clinician: Referring Physician: [name] Treating Physician/Extender: [name]: 41 Information Obtained from: Patient Chief Complaint Patient is at the clinic for [name] of an open pressure ulcer the left upper thigh and gluteal region and the right heel with bilateral swelling of the legs all of it which is going on for over a year Electronic Signature(s) Signed: 05/09/2016 8:45:09 AM By: [name] Kanner MD, FACS Entered By: [name] on 05/09/2016 08:45:09 [name] ([id number]) -------------------------------------------------------------------------------- HPI Details Patient Name: [name] Date of Service: 05/09/2016 8:00 AM Medical Record Number: [id number] Patient Account Number: [id number] Date of Birth/Sex: 05-05-1946 (70 y.o. Male) Treating RN: [name] Primary Care Physician: [name] Other Clinician: Referring Physician: [name] Treating Physician/Extender: [name]: 18 History of Present Illness Location: ulcerated area on the right heel, left gluteal region and thigh and then bilateral lower extremities Quality: Patient reports experiencing a sharp pain to affected area(s). Severity: Patient states wound are getting worse. Duration: Patient has had the wound for > 12 months prior to seeking [name] at the wound center Timing: Pain in wound is constant (hurts all the time) Context: The wound appeared gradually over time Modifying Factors: Other [name](s) tried include: he sees his heart doctor and his primary care doctor Associated Signs and  Symptoms: patient has not been able to walk for over a year now HPI Description: 70 year old gentleman with a known history of hypertension, diabetes, obstructive sleep apnea, COPD, diastolic CHF, coronary artery disease was admitted to the hospital with sepsis from an ulcer of the right heel and was treated there in October 2016. he also has chronic bilateral lower extremity edema and lymphedema. He had received vancomycin, Zosyn and at that stage and x-rays showed hardware in the right ankle but no evidence of osteomyelitis. he was a former smoker. he was also treated with Augmentin orally for 10 days. He is either bedbound or wheelchair-bound and does not ambulate by himself. 01/19/2016 -- he has not been seen here for 3 weeks and this was because he was admitted to [facility] between 223 and 01/08/2016 for sepsis, UTI and pneumonia involving the left lung. he was treated with IV vancomycin and Zosyn and then meropenem. Was discharged home on oral Bactrim for 2 weeks none of his vascular test or x-rays were done and we will reorder these. 01/26/2016 -- he has not yet done the x-ray of his foot and is vascular tests are still pending. I have asked him to work on these with his nursing home staff. Addendum: he has got an x-ray of the right foot done which shows that his osteopenia but no specific ostial lysis or abnormal periosteal reaction. Final impression was degenerative changes with dorsal foot soft tissue swelling. 02/16/2016 -- lower extremity arterial duplex examination shows a 50-99% stenosis of the right tibioperoneal trunk. He had biphasic flow in the right SFA, popliteal and tibioperoneal trunk. Left-sided he had triphasic flow throughout 02/29/2016 -- he is awaiting his vascular opinion with Dr. Gilda Crease which is to be done on April 24 03/08/2016 -- he has not kept his appointment with Dr. Gilda Crease on April 24 and does not seem to know what happened  about  this. it's difficult to gauge whether he is in full control of his mental faculties as at times he is extremely rude to the nursing staff. 03/22/2016 -- the patient was seen by Dr. 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 Recommended that the patient had severe atherosclerotic changes of both lower extremities associated with ulceration and tissue loss of the foot. This is a limb threatening ischemia and the patient was recommended to undergo [name], [name]. ([id number]) angiography of the lower extremity with a hope for intervention for limb salvage. Patient agreed and will proceed to angiography. He was admitted to the hospital between May 2 and 03/15/2016 - he underwent induction of a catheter into his right lower extremity and third order catheter placement with contrast injection to the right lower extremity for distal runoff. Percutaneous transluminal angioplasty of the right superficial femoral and popliteal arteries were done. He also had a right peroneal angioplasty. Patient had a postoperative hematoma and was admitted for observation and in the next 24 hours he was very agitated and combative and had to be seen by psychiatric. He had a follow-up with Dr. Gilda CreaseSchnier in 3 weeks. 04/18/2016 -- notes reviewed from the vascular office where he was seen for his postop visit after the PTA of the right SFA and popliteal arteries on 03/12/2016. He underwent an ABI which showed right ABI to be more than 1.3 and left to be more than 1 more than 1.3, great toe and PPG waveforms are decreased bilaterally. No additional intervention indicated at this time and the patient was to follow-up in 3 months with an ABI and bilateral lower extremity duplex study. 05/02/2016 -- he complains that his compression stockings are causing him a lot of discomfort and he is not happy wearing  them. Electronic Signature(s) Signed: 05/09/2016 8:45:16 AM By: [name] KannerBritto, Neaveh Belanger MD, FACS Entered By: [name] KannerBritto, Norvin Ohlin on 05/09/2016 08:45:16 [name] OttoLINDLEY, [name] ([id number]) -------------------------------------------------------------------------------- Physical Exam Details Patient Name: [name] OttoLINDLEY, [name] Date of Service: 05/09/2016 8:00 AM Medical Record Number: [id number] Patient Account Number: [id number] Date of Birth/Sex: [birth year](70 y.o. Male) Treating RN: [name] HaggisPinkerton, Debi Primary Care Physician: [name] MilchSMITH, [name] Other Clinician: Referring Physician: Oretha MilchSMITH, [name] Treating Physician/Extender: [name] ReBritto, Shaneka Efaw Weeks in [name]: 18 Constitutional . Pulse regular. Respirations normal and unlabored. Afebrile. . Eyes Nonicteric. Reactive to light. Ears, Nose, Mouth, and Throat Lips, teeth, and gums WNL.Marland [name]. Moist mucosa without lesions. Neck supple and nontender. No palpable supraclavicular or cervical adenopathy. Normal sized without goiter. Respiratory WNL. No retractions.. Breath sounds WNL, No rubs, rales, rhonchi, or wheeze.. Cardiovascular Heart rhythm and rate regular, no murmur or gallop.. Pedal Pulses WNL. No clubbing, cyanosis or edema. Lymphatic No adneopathy. No adenopathy. No adenopathy. Musculoskeletal Adexa without tenderness or enlargement.. Digits and nails w/o clubbing, cyanosis, infection, petechiae, ischemia, or inflammatory conditions.. Integumentary (Hair, Skin) No suspicious lesions. No crepitus or fluctuance. No peri-wound warmth or erythema. No masses.Marland [name]. Psychiatric Judgement and insight Intact.. No evidence of depression, anxiety, or agitation.. Notes both lower extremity edema is looking less and he has some eschar but not much of open ulcerations. The wound on the right medial foot was washed out with moist saline gauze and looks good with brisk bleeding controlled with pressure. Electronic Signature(s) Signed: 05/09/2016 8:46:33 AM By: [name] KannerBritto, Caroline Longie MD,  FACS Entered By: [name] KannerBritto, Teana Lindahl on 05/09/2016 08:46:33 [name] OttoLINDLEY, [name] ([id number]) -------------------------------------------------------------------------------- Physician Orders Details Patient Name: Kandis MannanLINDLEY, Arhaan  [name]. Date of Service: 05/09/2016 8:00 AM Medical Record Number: [id number] Patient Account Number: [id number] Date of Birth/Sex: [birth year] (70 y.o. Male) Treating RN: [name] Primary Care Physician: [name] Other Clinician: Referring Physician: [name] Treating Physician/Extender: [name]: 7 Verbal / Phone Orders: No Diagnosis Coding Wound Cleansing Wound #1 Left Lower Leg o May Shower, gently pat wound dry prior to applying new dressing. Wound #2 Right Lower Leg o May Shower, gently pat wound dry prior to applying new dressing. Wound #3 Right Calcaneus o May Shower, gently pat wound dry prior to applying new dressing. Wound #6 Right,Lateral Foot o May Shower, gently pat wound dry prior to applying new dressing. Anesthetic Wound #1 Left Lower Leg o Topical Lidocaine 4% cream applied to wound bed prior to debridement - for clinic use only o Topical Lidocaine 4% cream applied to wound bed prior to debridement - for clinic use only Wound #3 Right Calcaneus o Topical Lidocaine 4% cream applied to wound bed prior to debridement - for clinic use only Wound #6 Right,Lateral Foot o Topical Lidocaine 4% cream applied to wound bed prior to debridement - for clinic use only o Topical Lidocaine 4% cream applied to wound bed prior to debridement - for clinic use only Skin Barriers/Peri-Wound Care Wound #3 Right Calcaneus o Skin Prep Wound #6 Right,Lateral Foot o Skin Prep Wound #1 Left Lower Leg o Moisturizing lotion Wound #2 Right Lower Leg o Moisturizing lotion [name], [name] ([id number]) Primary Wound Dressing Wound #1 Left Lower Leg o Aquacel Ag - Please wet with saline before removing Wound #2  Right Lower Leg o Aquacel Ag - Please wet with saline before removing Wound #3 Right Calcaneus o Aquacel Ag - Please wet with saline before removing Wound #6 Right,Lateral Foot o Aquacel Ag - Please wet with saline before removing Secondary Dressing Wound #1 Left Lower Leg o ABD pad o Conform/Kerlix Wound #2 Right Lower Leg o ABD pad o Conform/Kerlix Wound #3 Right Calcaneus o Boardered Foam Dressing Wound #6 Right,Lateral Foot o Boardered Foam Dressing Dressing Change Frequency Wound #1 Left Lower Leg o Change dressing every day. - to be changed daily by SNF RN Wound #2 Right Lower Leg o Change dressing every day. - to be changed daily by SNF RN Wound #3 Right Calcaneus o Change dressing every day. - to be changed daily by SNF RN Wound #6 Right,Lateral Foot o Change dressing every day. - to be changed daily by SNF RN Follow-up Appointments Wound #1 Left Lower Leg o Return Appointment in 1 week. Wound #2 Right Lower Leg o Return Appointment in 1 week. [name], [name] ([id number]) Wound #3 Right Calcaneus o Return Appointment in 1 week. Wound #6 Right,Lateral Foot o Return Appointment in 1 week. Edema Control Wound #1 Left Lower Leg o Elevate legs to the level of the heart and pump ankles as often as possible o Support Garment 20-30 mm/Hg pressure to: - Please place these on in the morning before he gets up and take off only at shower time and at bedtime. Nurse to assist. Wound #2 Right Lower Leg o Elevate legs to the level of the heart and pump ankles as often as possible o Support Garment 20-30 mm/Hg pressure to: - Please place these on in the morning before he gets up and take off only at shower time and at bedtime. Nurse to assist. Wound #6 Right,Lateral Foot o Patient to wear own Juxtalite/Juzo compression garment. - Nursing home  to try and get him these. Patient says the regular socks compression is too tight and  hurts. o Elevate legs to the level of the heart and pump ankles as often as possible o Support Garment 20-30 mm/Hg pressure to: - Please place these on in the morning before he gets up and take off only at shower time and at bedtime. Nurse to assist. Off-Loading Wound #1 Left Lower Leg o Turn and reposition every 2 hours o Other: - sage boots at night and float heel when lying in bed Wound #2 Right Lower Leg o Turn and reposition every 2 hours o Other: - sage boots at night and float heel when lying in bed Wound #3 Right Calcaneus o Turn and reposition every 2 hours o Other: - sage boots at night and float heel when lying in bed Wound #6 Right,Lateral Foot o Turn and reposition every 2 hours o Other: - sage boots at night and float heel when lying in bed Additional Orders / Instructions Wound #1 Left Lower Leg [name], [name] ([id number]) o Increase protein intake. Wound #2 Right Lower Leg o Increase protein intake. Wound #3 Right Calcaneus o Increase protein intake. Wound #6 Right,Lateral Foot o Increase protein intake. Medications-please add to medication list. Wound #1 Left Lower Leg o Other: - Vitamin C, Vitamin A, Zinc, multivitamin Wound #2 Right Lower Leg o Other: - Vitamin C, Vitamin A, Zinc, multivitamin Wound #3 Right Calcaneus o Other: - Vitamin C, Vitamin A, Zinc, multivitamin Wound #6 Right,Lateral Foot o Other: - Vitamin C, Vitamin A, Zinc, multivitamin Electronic Signature(s) Signed: 05/09/2016 4:10:50 PM By: [name] Kanner MD, FACS Signed: 05/10/2016 5:29:01 PM By: Alejandro Mulling Entered By: Alejandro Mulling on 05/09/2016 08:32:49 [name] ([id number]) -------------------------------------------------------------------------------- Problem List Details Patient Name: [name] Date of Service: 05/09/2016 8:00 AM Medical Record Number: [id number] Patient Account Number: [id number] Date of Birth/Sex:  03-25-1946 (70 y.o. Male) Treating RN: [name] Primary Care Physician: [name] Other Clinician: Referring Physician: [name] Treating Physician/Extender: [name]: 6 Active Problems ICD-10 Encounter Code Description Active Date Diagnosis E11.621 Type 2 diabetes mellitus with foot ulcer 01/01/2016 Yes L89.613 Pressure ulcer of right heel, stage 3 01/01/2016 Yes E66.01 Morbid (severe) obesity due to excess calories 01/01/2016 Yes I89.0 Lymphedema, not elsewhere classified 01/01/2016 Yes M70.871 Other soft tissue disorders related to use, overuse and 01/19/2016 Yes pressure, right ankle and foot 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/16/2016 Yes ulceration of heel and midfoot Inactive Problems Resolved Problems ICD-10 Code Description Active Date Resolved Date L89.322 Pressure ulcer of left buttock, stage 2 01/01/2016 01/01/2016 Electronic Signature(s) Signed: 05/09/2016 8:44:49 AM By: [name] Kanner MD, FACS Entered By: [name] on 05/09/2016 08:44:49 [name] ([id number]) [name], Rydge VMarland [name] ([id number]) -------------------------------------------------------------------------------- Progress Note Details Patient Name: [name] Date of Service: 05/09/2016 8:00 AM Medical Record Number: [id number] Patient Account Number: [id number] Date of Birth/Sex: 12/20/1945 (69 y.o. Male) Treating RN: [name] Primary Care Physician: [name] Other Clinician: Referring Physician: [name] Treating Physician/Extender: [name]: 24 Subjective Chief Complaint Information obtained from Patient Patient is at the clinic for [name] of an open pressure ulcer the left upper thigh and gluteal region and the right heel with bilateral swelling of the legs all of it which is going on for over a year History of Present Illness (HPI) The following HPI elements were documented for the  patient's wound: Location: ulcerated area on the  right heel, left gluteal region and thigh and then bilateral lower extremities Quality: Patient reports experiencing a sharp pain to affected area(s). Severity: Patient states wound are getting worse. Duration: Patient has had the wound for > 12 months prior to seeking [name] at the wound center Timing: Pain in wound is constant (hurts all the time) Context: The wound appeared gradually over time Modifying Factors: Other [name](s) tried include: he sees his heart doctor and his primary care doctor Associated Signs and Symptoms: patient has not been able to walk for over a year now 70 year old gentleman with a known history of hypertension, diabetes, obstructive sleep apnea, COPD, diastolic CHF, coronary artery disease was admitted to the hospital with sepsis from an ulcer of the right heel and was treated there in October 2016. he also has chronic bilateral lower extremity edema and lymphedema. He had received vancomycin, Zosyn and at that stage and x-rays showed hardware in the right ankle but no evidence of osteomyelitis. he was a former smoker. he was also treated with Augmentin orally for 10 days. He is either bedbound or wheelchair-bound and does not ambulate by himself. 01/19/2016 -- he has not been seen here for 3 weeks and this was because he was admitted to [facility] between 223 and 01/08/2016 for sepsis, UTI and pneumonia involving the left lung. he was treated with IV vancomycin and Zosyn and then meropenem. Was discharged home on oral Bactrim for 2 weeks none of his vascular test or x-rays were done and we will reorder these. 01/26/2016 -- he has not yet done the x-ray of his foot and is vascular tests are still pending. I have asked him to work on these with his nursing home staff. Addendum: he has got an x-ray of the right foot done which shows that his osteopenia but no specific ostial lysis or  abnormal periosteal reaction. Final impression was degenerative changes with dorsal foot soft tissue swelling. 02/16/2016 -- lower extremity arterial duplex examination shows a 50-99% stenosis of the right tibioperoneal trunk. He had biphasic flow in the right SFA, popliteal and tibioperoneal trunk. Left-sided he had triphasic flow throughout [name], [name] ([id number]) 02/29/2016 -- he is awaiting his vascular opinion with Dr. Gilda Crease which is to be done on April 24 03/08/2016 -- he has not kept his appointment with Dr. Gilda Crease on April 24 and does not seem to know what happened about this. it's difficult to gauge whether he is in full control of his mental faculties as at times he is extremely rude to the nursing staff. 03/22/2016 -- the patient was seen by Dr. 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 Recommended that the patient had severe atherosclerotic changes of both lower extremities associated with ulceration and tissue loss of the foot. This is a limb threatening ischemia and the patient was recommended to undergo angiography of the lower extremity with a hope for intervention for limb salvage. Patient agreed and will proceed to angiography. He was admitted to the hospital between May 2 and 03/15/2016 - he underwent induction of a catheter into his right lower extremity and third order catheter placement with contrast injection to the right lower extremity for distal runoff. Percutaneous transluminal angioplasty of the right superficial femoral and popliteal arteries were done. He also had a right peroneal angioplasty. Patient had a postoperative hematoma and was admitted for observation and in the next 24  hours he was very agitated and combative and had to be seen by psychiatric. He had a follow-up with Dr. Gilda Crease in 3 weeks. 04/18/2016 -- notes reviewed from the  vascular office where he was seen for his postop visit after the PTA of the right SFA and popliteal arteries on 03/12/2016. He underwent an ABI which showed right ABI to be more than 1.3 and left to be more than 1 more than 1.3, great toe and PPG waveforms are decreased bilaterally. No additional intervention indicated at this time and the patient was to follow-up in 3 months with an ABI and bilateral lower extremity duplex study. 05/02/2016 -- he complains that his compression stockings are causing him a lot of discomfort and he is not happy wearing them. Objective Constitutional Pulse regular. Respirations normal and unlabored. Afebrile. Vitals Time Taken: 7:58 AM, Height: 70 in, Weight: 235 lbs, BMI: 33.7, Temperature: 98.2 F, Pulse: 99 bpm, Respiratory Rate: 20 breaths/min, Blood Pressure: 142/76 mmHg. Eyes Nonicteric. Reactive to light. Ears, Nose, Mouth, and Throat Lips, teeth, and gums WNL.Marland [name] Moist mucosa without lesions. Neck supple and nontender. No palpable supraclavicular or cervical adenopathy. Normal sized without goiter. Respiratory [name], Benford [name]. ([id number]) WNL. No retractions.. Breath sounds WNL, No rubs, rales, rhonchi, or wheeze.. Cardiovascular Heart rhythm and rate regular, no murmur or gallop.. Pedal Pulses WNL. No clubbing, cyanosis or edema. Lymphatic No adneopathy. No adenopathy. No adenopathy. Musculoskeletal Adexa without tenderness or enlargement.. Digits and nails w/o clubbing, cyanosis, infection, petechiae, ischemia, or inflammatory conditions.Marland [name] Psychiatric Judgement and insight Intact.. No evidence of depression, anxiety, or agitation.. General Notes: both lower extremity edema is looking less and he has some eschar but not much of open ulcerations. The wound on the right medial foot was washed out with moist saline gauze and looks good with brisk bleeding controlled with pressure. Integumentary (Hair, Skin) No suspicious lesions. No crepitus or  fluctuance. No peri-wound warmth or erythema. No masses.. Wound #1 status is Open. Original cause of wound was Gradually Appeared. The wound is located on the Left Lower Leg. The wound measures 4cm length x 3cm width x 0.1cm depth; 9.425cm^2 area and 0.942cm^3 volume. The wound is limited to skin breakdown. There is no tunneling or undermining noted. There is a large amount of serosanguineous drainage noted. The wound margin is flat and intact. There is large (67-100%) pink granulation within the wound bed. There is a small (1-33%) amount of necrotic tissue within the wound bed including Eschar. The periwound skin appearance exhibited: Localized Edema, Moist, Erythema. The surrounding wound skin color is noted with erythema which is circumferential. Periwound temperature was noted as No Abnormality. The periwound has tenderness on palpation. Wound #2 status is Open. Original cause of wound was Gradually Appeared. The wound is located on the Right Lower Leg. The wound measures 3cm length x 3cm width x 0.1cm depth; 7.069cm^2 area and 0.707cm^3 volume. The wound is limited to skin breakdown. There is no tunneling or undermining noted. There is a medium amount of serosanguineous drainage noted. The wound margin is flat and intact. There is large (67-100%) red granulation within the wound bed. There is a small (1-33%) amount of necrotic tissue within the wound bed including Eschar. The periwound skin appearance exhibited: Localized Edema, Moist, Erythema. The periwound skin appearance did not exhibit: Maceration. The surrounding wound skin color is noted with erythema which is circumferential. Periwound temperature was noted as No Abnormality. The periwound has tenderness on palpation. Wound #3 status is Open. Original cause  of wound was Pressure Injury. The wound is located on the Right Calcaneus. The wound measures 0.7cm length x 0.9cm width x 0.2cm depth; 0.495cm^2 area and 0.099cm^3 volume. The  wound is limited to skin breakdown. There is no tunneling or undermining noted. There is a large amount of serosanguineous drainage noted. The wound margin is flat and intact. There is medium (34-66%) red, pink granulation within the wound bed. There is a medium (34-66%) amount of necrotic tissue within the wound bed including Adherent Slough. The periwound skin appearance exhibited: Localized Edema, Maceration, Moist, Erythema. The surrounding wound skin color is noted with erythema which is circumferential. Periwound temperature was noted as No Abnormality. The periwound has tenderness on palpation. Langbehn, Luisalberto [name]. ([id number]) Wound #6 status is Open. Original cause of wound was Pressure Injury. The wound is located on the Right,Lateral Foot. The wound measures 0.3cm length x 0.3cm width x 0.1cm depth; 0.071cm^2 area and 0.007cm^3 volume. The wound is limited to skin breakdown. There is a large amount of serosanguineous drainage noted. The wound margin is flat and intact. There is small (1-33%) pink granulation within the wound bed. There is a large (67-100%) amount of necrotic tissue within the wound bed including Eschar and Adherent Slough. The periwound skin appearance exhibited: Moist. The periwound skin appearance did not exhibit: Callus, Crepitus, Excoriation, Fluctuance, Friable, Induration, Localized Edema, Rash, Scarring, Dry/Scaly, Maceration, Atrophie Blanche, Cyanosis, Ecchymosis, Hemosiderin Staining, Mottled, Pallor, Rubor, Erythema. Periwound temperature was noted as No Abnormality. The periwound has tenderness on palpation. 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 Wound #2 Right Lower Leg: May Shower, gently pat wound dry prior to applying new dressing. Wound #3 Right Calcaneus: May Shower, gently pat wound dry prior to applying new dressing. Wound #6 Right,Lateral Foot: May Shower, gently pat wound dry prior to applying new dressing. Anesthetic: Wound #1 Left Lower Leg: Topical Lidocaine 4% cream applied to wound bed prior to debridement - for clinic use only Topical Lidocaine 4% cream applied to wound bed prior to debridement - for clinic use only Wound #3 Right Calcaneus: Topical Lidocaine 4% cream applied to wound bed prior to debridement - for clinic use only Wound #6 Right,Lateral Foot: [name], [name] ([id number]) Topical Lidocaine 4% cream applied to wound bed prior to debridement - for clinic use only Topical Lidocaine 4% cream applied to wound bed prior to debridement - for clinic use only Skin Barriers/Peri-Wound Care: Wound #3 Right Calcaneus: Skin Prep Wound #6 Right,Lateral Foot: Skin Prep Wound #1 Left Lower Leg: Moisturizing lotion Wound #2 Right Lower Leg: Moisturizing lotion Primary Wound Dressing: Wound #1 Left Lower Leg: Aquacel Ag - Please wet with saline before removing Wound #2 Right Lower Leg: Aquacel Ag - Please wet with saline before removing Wound #3 Right Calcaneus: Aquacel Ag - Please wet with saline before removing Wound #6 Right,Lateral Foot: Aquacel Ag - Please wet with saline before removing Secondary Dressing: Wound #1 Left Lower Leg: ABD pad Conform/Kerlix Wound #2 Right Lower Leg: ABD pad Conform/Kerlix Wound #3 Right  Calcaneus: Boardered Foam Dressing Wound #6 Right,Lateral Foot: Boardered Foam Dressing Dressing Change Frequency: Wound #1 Left Lower Leg: Change dressing every day. - to be  changed daily by SNF RN Wound #2 Right Lower Leg: Change dressing every day. - to be changed daily by SNF RN Wound #3 Right Calcaneus: Change dressing every day. - to be changed daily by SNF RN Wound #6 Right,Lateral Foot: Change dressing every day. - to be changed daily by SNF RN Follow-up Appointments: Wound #1 Left Lower Leg: Return Appointment in 1 week. Wound #2 Right Lower Leg: Return Appointment in 1 week. Wound #3 Right Calcaneus: Return Appointment in 1 week. Wound #6 Right,Lateral Foot: Return Appointment in 1 week. Edema Control: Wound #1 Left Lower Leg: Furtick, Vick [name]. ([id number]) Elevate legs to the level of the heart and pump ankles as often as possible Support Garment 20-30 mm/Hg pressure to: - Please place these on in the morning before he gets up and take off only at shower time and at bedtime. Nurse to assist. Wound #2 Right Lower Leg: Elevate legs to the level of the heart and pump ankles as often as possible Support Garment 20-30 mm/Hg pressure to: - Please place these on in the morning before he gets up and take off only at shower time and at bedtime. Nurse to assist. Wound #6 Right,Lateral Foot: Patient to wear own Juxtalite/Juzo compression garment. - Nursing home to try and get him these. Patient says the regular socks compression is too tight and hurts. Elevate legs to the level of the heart and pump ankles as often as possible Support Garment 20-30 mm/Hg pressure to: - Please place these on in the morning before he gets up and take off only at shower time and at bedtime. Nurse to assist. Off-Loading: Wound #1 Left Lower Leg: Turn and reposition every 2 hours Other: - sage boots at night and float heel when lying in bed Wound #2 Right Lower Leg: Turn and reposition every 2 hours Other: - sage boots at night and float heel when lying in bed Wound #3 Right Calcaneus: Turn and reposition every 2 hours Other: - sage boots at night and float heel when  lying in bed Wound #6 Right,Lateral Foot: Turn and reposition every 2 hours Other: - sage boots at night and float heel when lying in bed Additional Orders / Instructions: Wound #1 Left Lower Leg: Increase protein intake. Wound #2 Right Lower Leg: Increase protein intake. Wound #3 Right Calcaneus: Increase protein intake. Wound #6 Right,Lateral Foot: Increase protein intake. Medications-please add to medication list.: Wound #1 Left Lower Leg: Other: - Vitamin C, Vitamin A, Zinc, multivitamin Wound #2 Right Lower Leg: Other: - Vitamin C, Vitamin A, Zinc, multivitamin Wound #3 Right Calcaneus: Other: - Vitamin C, Vitamin A, Zinc, multivitamin Wound #6 Right,Lateral Foot: Other: - Vitamin C, Vitamin A, Zinc, multivitamin Rom, Nareg [name]. ([id number]) I have recommended: 1. Aquacell AG to his right heel. The patient also has a wound on the right lateral foot. This will be treated with silver alginate 2. Constant off loading and also using Sage boot. 3. he has not been happy with his bilateral lower extremity compression stockings of the 20-30 minute variety. we will order him some juxta lites which is nursing or home will have to get him. 4. High-protein diet and multivitamins including vitamin C and zinc. Electronic Signature(s) Signed: 05/09/2016 8:47:11 AM By: [name] Kanner MD, FACS Entered By: [name] on 05/09/2016 08:47:11 Hadaway, Carmon [name]. ([id number]) --------------------------------------------------------------------------------  SuperBill Details Patient Name: [name] OttoLINDLEY, Arkel [name]. Date of Service: 05/09/2016 Medical Record Number: [id number] Patient Account Number: [id number] Date of Birth/Sex: Apr 13, 1946 [age] y.o. 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 Facility Procedures CPT4 Code: [id number] Description: [id number] - WOUND CARE VISIT-LEV 5 EST PT Modifier: Quantity: 1 Physician Procedures CPT4: Description Modifier Quantity Code [id number] - WC PHYS LEVEL 3 - EST PT 1 ICD-10 Description Diagnosis E11.621 Type 2 diabetes mellitus with foot ulcer L89.613 Pressure ulcer of right heel, stage 3 I89.0 Lymphedema, not elsewhere classified  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

## 2016-05-16 ENCOUNTER — Encounter: Payer: Medicare Other | Attending: Surgery | Admitting: Surgery

## 2016-05-16 DIAGNOSIS — I890 Lymphedema, not elsewhere classified: Secondary | ICD-10-CM | POA: Insufficient documentation

## 2016-05-16 DIAGNOSIS — M70871 Other soft tissue disorders related to use, overuse and pressure, right ankle and foot: Secondary | ICD-10-CM | POA: Insufficient documentation

## 2016-05-16 DIAGNOSIS — I110 Hypertensive heart disease with heart failure: Secondary | ICD-10-CM | POA: Insufficient documentation

## 2016-05-16 DIAGNOSIS — I2510 Atherosclerotic heart disease of native coronary artery without angina pectoris: Secondary | ICD-10-CM | POA: Insufficient documentation

## 2016-05-16 DIAGNOSIS — G4733 Obstructive sleep apnea (adult) (pediatric): Secondary | ICD-10-CM | POA: Diagnosis not present

## 2016-05-16 DIAGNOSIS — I70234 Atherosclerosis of native arteries of right leg with ulceration of heel and midfoot: Secondary | ICD-10-CM | POA: Diagnosis not present

## 2016-05-16 DIAGNOSIS — J449 Chronic obstructive pulmonary disease, unspecified: Secondary | ICD-10-CM | POA: Insufficient documentation

## 2016-05-16 DIAGNOSIS — Z6833 Body mass index (BMI) 33.0-33.9, adult: Secondary | ICD-10-CM | POA: Insufficient documentation

## 2016-05-16 DIAGNOSIS — E11621 Type 2 diabetes mellitus with foot ulcer: Secondary | ICD-10-CM | POA: Insufficient documentation

## 2016-05-16 DIAGNOSIS — L89613 Pressure ulcer of right heel, stage 3: Secondary | ICD-10-CM | POA: Diagnosis not present

## 2016-05-16 DIAGNOSIS — I5032 Chronic diastolic (congestive) heart failure: Secondary | ICD-10-CM | POA: Insufficient documentation

## 2016-05-16 DIAGNOSIS — Z87891 Personal history of nicotine dependence: Secondary | ICD-10-CM | POA: Insufficient documentation

## 2016-05-17 NOTE — Progress Notes (Signed)
SHASHANK, KWASNIK ([id number]) Visit Report for 05/16/2016 Chief Complaint Document Details Patient Name: [name], [name] Date of Service: 05/16/2016 9:30 AM Medical Record Number: [id number] Patient Account Number: [id number] Date of Birth/Sex: July 23, 1946 (70 y.o. Male) Treating RN: Phillis Haggis Primary Care Physician: Oretha Milch Other Clinician: Referring Physician: Oretha Milch Treating Physician/Extender: Rudene Re in Treatment: 32 Information Obtained from: Patient Chief Complaint Patient is at the clinic for treatment of an open pressure ulcer the left upper thigh and gluteal region and the right heel with bilateral swelling of the legs all of it which is going on for over a year Electronic Signature(s) Signed: 05/16/2016 10:09:22 AM By: Evlyn Kanner MD, FACS Entered By: Evlyn Kanner on 05/16/2016 10:09:22 [name] ([id number]) -------------------------------------------------------------------------------- Debridement Details Patient Name: [name] Date of Service: 05/16/2016 9:30 AM Medical Record Number: [id number] Patient Account Number: [id number] Date of Birth/Sex: 1946-06-09 (70 y.o. Male) Treating RN: Phillis Haggis Primary Care Physician: Oretha Milch Other Clinician: Referring Physician: Oretha Milch Treating Physician/Extender: Rudene Re in Treatment: 19 Debridement Performed for Wound #3 Right Calcaneus Assessment: Performed By: Physician Evlyn Kanner, MD Debridement: Debridement Pre-procedure Yes Verification/Time Out Taken: Start Time: 09:54 Pain Control: Other : lidocaine 4% cream Level: Skin/Subcutaneous Tissue Total Area Debrided (L x 0.6 (cm) x 1.8 (cm) = 1.08 (cm) W): Tissue and other Viable, Non-Viable, Exudate, Fibrin/Slough, Subcutaneous material debrided: Instrument: Curette Bleeding: Minimum Hemostasis Achieved: Pressure End Time: 09:57 Procedural Pain: 0 Post Procedural Pain: 0 Response to Treatment:  Procedure was tolerated well Post Debridement Measurements of Total Wound Length: (cm) 0.6 Stage: Category/Stage III Width: (cm) 1.8 Depth: (cm) 0.3 Volume: (cm) 0.254 Post Procedure Diagnosis Same as Pre-procedure Electronic Signature(s) Signed: 05/16/2016 10:09:16 AM By: Evlyn Kanner MD, FACS Signed: 05/16/2016 5:50:37 PM By: Alejandro Mulling Entered By: Evlyn Kanner on 05/16/2016 10:09:16 [name] ([id number]) -------------------------------------------------------------------------------- HPI Details Patient Name: [name] Date of Service: 05/16/2016 9:30 AM Medical Record Number: [id number] Patient Account Number: [id number] Date of Birth/Sex: 01-19-1946 (70 y.o. Male) Treating RN: Phillis Haggis Primary Care Physician: Oretha Milch Other Clinician: Referring Physician: Oretha Milch Treating Physician/Extender: Rudene Re in Treatment: 20 History of Present Illness Location: ulcerated area on the right heel, left gluteal region and thigh and then bilateral lower extremities Quality: Patient reports experiencing a sharp pain to affected area(s). Severity: Patient states wound are getting worse. Duration: Patient has had the wound for > 12 months prior to seeking treatment at the wound center Timing: Pain in wound is constant (hurts all the time) Context: The wound appeared gradually over time Modifying Factors: Other treatment(s) tried include: he sees his heart doctor and his primary care doctor Associated Signs and Symptoms: patient has not been able to walk for over a year now HPI Description: 70 year old gentleman with a known history of hypertension, diabetes, obstructive sleep apnea, COPD, diastolic CHF, coronary artery disease was admitted to the hospital with sepsis from an ulcer of the right heel and was treated there in October 2016. he also has chronic bilateral lower extremity edema and lymphedema. He had received vancomycin, Zosyn and at  that stage and x-rays showed hardware in the right ankle but no evidence of osteomyelitis. he was a former smoker. he was also treated with Augmentin orally for 10 days. He is either bedbound or wheelchair-bound and does not ambulate by himself. 01/19/2016 -- he has not been seen here for 3 weeks and this was because he was admitted to [facility] between 223 and 01/08/2016 for sepsis, UTI and pneumonia involving the left lung. he was treated with IV vancomycin and Zosyn and then meropenem. Was discharged home on oral Bactrim for 2 weeks none of his vascular test or x-rays were done and we will reorder these. 01/26/2016 -- he has not yet done the x-ray of his foot and is vascular tests are still pending. I have asked him to work on these with his nursing home staff. Addendum: he has got an x-ray of the right foot done which shows that his osteopenia but no specific ostial lysis or abnormal periosteal reaction. Final impression was degenerative changes with dorsal foot soft tissue swelling. 02/16/2016 -- lower extremity arterial duplex examination shows a 50-99% stenosis of the right tibioperoneal trunk. He had biphasic flow in the right SFA, popliteal and tibioperoneal trunk. Left-sided he had triphasic flow throughout 02/29/2016 -- he is awaiting his vascular opinion with Dr. Gilda Crease which is to be done on April 24 03/08/2016 -- he has not kept his appointment with Dr. Gilda Crease on April 24 and does not seem to know what happened about this. it's difficult to gauge whether he is in full control of his mental faculties as at times he is extremely rude to the nursing staff. 03/22/2016 -- the patient was seen by Dr. 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 Recommended that the patient had severe atherosclerotic changes of both lower extremities associated with  ulceration and tissue loss of the foot. This is a limb threatening ischemia and the patient was recommended to undergo Debes, Wael V. ([id number]) angiography of the lower extremity with a hope for intervention for limb salvage. Patient agreed and will proceed to angiography. He was admitted to the hospital between May 2 and 03/15/2016 - he underwent induction of a catheter into his right lower extremity and third order catheter placement with contrast injection to the right lower extremity for distal runoff. Percutaneous transluminal angioplasty of the right superficial femoral and popliteal arteries were done. He also had a right peroneal angioplasty. Patient had a postoperative hematoma and was admitted for observation and in the next 24 hours he was very agitated and combative and had to be seen by psychiatric. He had a follow-up with Dr. Gilda Crease in 3 weeks. 04/18/2016 -- notes reviewed from the vascular office where he was seen for his postop visit after the PTA of the right SFA and popliteal arteries on 03/12/2016. He underwent an ABI which showed right ABI to be more than 1.3 and left to be more than 1 more than 1.3, great toe and PPG waveforms are decreased bilaterally. No additional intervention indicated at this time and the patient was to follow-up in 3 months with an ABI and bilateral lower extremity duplex study. 05/02/2016 -- he complains that his compression stockings are causing him a lot of discomfort and he is not happy wearing them. Electronic Signature(s) Signed: 05/16/2016 10:09:26 AM By: Evlyn Kanner MD, FACS Entered By: Evlyn Kanner on 05/16/2016 10:09:26 [name] ([id number]) -------------------------------------------------------------------------------- Physical Exam Details Patient Name: [name] Date of Service: 05/16/2016 9:30 AM Medical Record Number: [id number] Patient Account Number: [id number] Date of Birth/Sex: 01/15/1946 (70 y.o.  Male) Treating RN: Phillis Haggis Primary Care Physician: Oretha Milch Other Clinician: Referring Physician: Oretha Milch Treating Physician/Extender: Rudene Re in Treatment: 19 Constitutional . Pulse regular. Respirations normal and unlabored. Afebrile. Marland Kitchen  Eyes Nonicteric. Reactive to light. Ears, Nose, Mouth, and Throat Lips, teeth, and gums WNL.Marland Kitchen Moist mucosa without lesions. Neck supple and nontender. No palpable supraclavicular or cervical adenopathy. Normal sized without goiter. Respiratory WNL. No retractions.. Cardiovascular Pedal Pulses WNL. No clubbing, cyanosis or edema. Chest Breasts symmetical and no nipple discharge.. Breast tissue WNL, no masses, lumps, or tenderness.. Lymphatic No adneopathy. No adenopathy. No adenopathy. Musculoskeletal Adexa without tenderness or enlargement.. Digits and nails w/o clubbing, cyanosis, infection, petechiae, ischemia, or inflammatory conditions.. Integumentary (Hair, Skin) No suspicious lesions. No crepitus or fluctuance. No peri-wound warmth or erythema. No masses.Marland Kitchen Psychiatric Judgement and insight Intact.. No evidence of depression, anxiety, or agitation.. Notes the lymphedema both lower extremities is much less and the area has no open wounds. The wound on the right calcaneal region has a lot of eschar surrounding it and there is some debris on the ulceration which is much smaller. Using a #3 curet Sharply removed all the subcutaneous debris and the surrounding callus. Bleeding was brisk and controlled with pressure Electronic Signature(s) Signed: 05/16/2016 10:10:14 AM By: Evlyn Kanner MD, FACS Entered By: Evlyn Kanner on 05/16/2016 10:10:14 [name] ([id number]) -------------------------------------------------------------------------------- Physician Orders Details Patient Name: [name] Date of Service: 05/16/2016 9:30 AM Medical Record Number: [id number] Patient Account Number: [id number] Date  of Birth/Sex: 07-Jan-1946 (70 y.o. Male) Treating RN: Phillis Haggis Primary Care Physician: Oretha Milch Other Clinician: Referring Physician: Oretha Milch Treating Physician/Extender: Rudene Re in Treatment: 73 Verbal / Phone Orders: Yes Clinician: Ashok Cordia, Debi Read Back and Verified: Yes Diagnosis Coding Wound Cleansing Wound #1 Left Lower Leg o May Shower, gently pat wound dry prior to applying new dressing. Wound #2 Right Lower Leg o May Shower, gently pat wound dry prior to applying new dressing. Wound #3 Right Calcaneus o May Shower, gently pat wound dry prior to applying new dressing. Wound #6 Right,Lateral Foot o May Shower, gently pat wound dry prior to applying new dressing. Anesthetic Wound #1 Left Lower Leg o Topical Lidocaine 4% cream applied to wound bed prior to debridement - for clinic use only o Topical Lidocaine 4% cream applied to wound bed prior to debridement - for clinic use only Wound #3 Right Calcaneus o Topical Lidocaine 4% cream applied to wound bed prior to debridement - for clinic use only Wound #6 Right,Lateral Foot o Topical Lidocaine 4% cream applied to wound bed prior to debridement - for clinic use only o Topical Lidocaine 4% cream applied to wound bed prior to debridement - for clinic use only Skin Barriers/Peri-Wound Care Wound #1 Left Lower Leg o Moisturizing lotion - to legs Wound #2 Right Lower Leg o Moisturizing lotion - to legs Wound #3 Right Calcaneus o Skin Prep o Barrier cream - around the heel wound to reddened and irritated areas Wound #6 Right,Lateral Foot [name], [name] ([id number]) o Skin Prep Primary Wound Dressing Wound #1 Left Lower Leg o Aquacel Ag - Please wet with saline before removing Wound #2 Right Lower Leg o Aquacel Ag - Please wet with saline before removing Wound #3 Right Calcaneus o Aquacel Ag - Please wet with saline before removing Wound #6 Right,Lateral  Foot o Aquacel Ag - Please wet with saline before removing Secondary Dressing Wound #1 Left Lower Leg o ABD pad o Conform/Kerlix Wound #2 Right Lower Leg o ABD pad o Conform/Kerlix Wound #3 Right Calcaneus o Boardered Foam Dressing Wound #6 Right,Lateral Foot o Boardered Foam Dressing Dressing Change Frequency Wound #1 Left Lower Leg   o Change dressing every day. - to be changed daily by SNF RN Wound #2 Right Lower Leg o Change dressing every day. - to be changed daily by SNF RN Wound #3 Right Calcaneus o Change dressing every day. - to be changed daily by SNF RN Wound #6 Right,Lateral Foot o Change dressing every day. - to be changed daily by SNF RN Follow-up Appointments Wound #1 Left Lower Leg o Return Appointment in 1 week. Wound #2 Right Lower Leg [name], [name] ([id number]) o Return Appointment in 1 week. Wound #3 Right Calcaneus o Return Appointment in 1 week. Wound #6 Right,Lateral Foot o Return Appointment in 1 week. Edema Control Wound #1 Left Lower Leg o Elevate legs to the level of the heart and pump ankles as often as possible o Support Garment 20-30 mm/Hg pressure to: - Please place these on in the morning before he gets up and take off only at shower time and at bedtime. Nurse to assist. Wound #2 Right Lower Leg o Elevate legs to the level of the heart and pump ankles as often as possible o Support Garment 20-30 mm/Hg pressure to: - Please place these on in the morning before he gets up and take off only at shower time and at bedtime. Nurse to assist. Wound #6 Right,Lateral Foot o Patient to wear own Juxtalite/Juzo compression garment. - Nursing home to try and get him these. Patient says the regular socks compression is too tight and hurts. o Elevate legs to the level of the heart and pump ankles as often as possible o Support Garment 20-30 mm/Hg pressure to: - Please place these on in the morning before he  gets up and take off only at shower time and at bedtime. Nurse to assist. Off-Loading Wound #1 Left Lower Leg o Turn and reposition every 2 hours o Other: - sage boots at night and float heel when lying in bed Wound #2 Right Lower Leg o Turn and reposition every 2 hours o Other: - sage boots at night and float heel when lying in bed Wound #3 Right Calcaneus o Turn and reposition every 2 hours o Other: - sage boots at night and float heel when lying in bed Wound #6 Right,Lateral Foot o Turn and reposition every 2 hours o Other: - sage boots at night and float heel when lying in bed Additional Orders / Instructions [name], [name] ([id number]) Wound #1 Left Lower Leg o Increase protein intake. Wound #2 Right Lower Leg o Increase protein intake. Wound #3 Right Calcaneus o Increase protein intake. Wound #6 Right,Lateral Foot o Increase protein intake. Medications-please add to medication list. Wound #1 Left Lower Leg o Other: - Vitamin C, Vitamin A, Zinc, multivitamin Wound #2 Right Lower Leg o Other: - Vitamin C, Vitamin A, Zinc, multivitamin Wound #3 Right Calcaneus o Other: - Vitamin C, Vitamin A, Zinc, multivitamin Wound #6 Right,Lateral Foot o Other: - Vitamin C, Vitamin A, Zinc, multivitamin Electronic Signature(s) Signed: 05/16/2016 4:55:16 PM By: Evlyn Kanner MD, FACS Signed: 05/16/2016 5:50:37 PM By: Alejandro Mulling Entered By: Alejandro Mulling on 05/16/2016 10:25:02 [name] ([id number]) -------------------------------------------------------------------------------- Problem List Details Patient Name: [name] Date of Service: 05/16/2016 9:30 AM Medical Record Number: [id number] Patient Account Number: [id number] Date of Birth/Sex: 1946/07/01 (70 y.o. Male) Treating RN: Phillis Haggis Primary Care Physician: Oretha Milch Other Clinician: Referring Physician: Oretha Milch Treating Physician/Extender: Rudene Re in Treatment: 78 Active Problems ICD-10 Encounter Code Description Active Date Diagnosis E11.621 Type  2 diabetes mellitus with foot ulcer 01/01/2016 Yes L89.613 Pressure ulcer of right heel, stage 3 01/01/2016 Yes E66.01 Morbid (severe) obesity due to excess calories 01/01/2016 Yes I89.0 Lymphedema, not elsewhere classified 01/01/2016 Yes M70.871 Other soft tissue disorders related to use, overuse and 01/19/2016 Yes pressure, right ankle and foot 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/16/2016 Yes ulceration of heel and midfoot Inactive Problems Resolved Problems ICD-10 Code Description Active Date Resolved Date L89.322 Pressure ulcer of left buttock, stage 2 01/01/2016 01/01/2016 Electronic Signature(s) Signed: 05/16/2016 10:09:06 AM By: Evlyn Kanner MD, FACS Entered By: Evlyn Kanner on 05/16/2016 10:09:06 [name] ([id number]) [name], [name] VMarland Kitchen ([id number]) -------------------------------------------------------------------------------- Progress Note Details Patient Name: [name] Date of Service: 05/16/2016 9:30 AM Medical Record Number: [id number] Patient Account Number: [id number] Date of Birth/Sex: 09/21/1946 (70 y.o. Male) Treating RN: Phillis Haggis Primary Care Physician: Oretha Milch Other Clinician: Referring Physician: Oretha Milch Treating Physician/Extender: Rudene Re in Treatment: 81 Subjective Chief Complaint Information obtained from Patient Patient is at the clinic for treatment of an open pressure ulcer the left upper thigh and gluteal region and the right heel with bilateral swelling of the legs all of it which is going on for over a year History of Present Illness (HPI) The following HPI elements were documented for the patient's wound: Location: ulcerated area on the right heel, left gluteal region and thigh and then bilateral lower extremities Quality: Patient reports experiencing a sharp pain to  affected area(s). Severity: Patient states wound are getting worse. Duration: Patient has had the wound for > 12 months prior to seeking treatment at the wound center Timing: Pain in wound is constant (hurts all the time) Context: The wound appeared gradually over time Modifying Factors: Other treatment(s) tried include: he sees his heart doctor and his primary care doctor Associated Signs and Symptoms: patient has not been able to walk for over a year now 70 year old gentleman with a known history of hypertension, diabetes, obstructive sleep apnea, COPD, diastolic CHF, coronary artery disease was admitted to the hospital with sepsis from an ulcer of the right heel and was treated there in October 2016. he also has chronic bilateral lower extremity edema and lymphedema. He had received vancomycin, Zosyn and at that stage and x-rays showed hardware in the right ankle but no evidence of osteomyelitis. he was a former smoker. he was also treated with Augmentin orally for 10 days. He is either bedbound or wheelchair-bound and does not ambulate by himself. 01/19/2016 -- he has not been seen here for 3 weeks and this was because he was admitted to [facility] between 223 and 01/08/2016 for sepsis, UTI and pneumonia involving the left lung. he was treated with IV vancomycin and Zosyn and then meropenem. Was discharged home on oral Bactrim for 2 weeks none of his vascular test or x-rays were done and we will reorder these. 01/26/2016 -- he has not yet done the x-ray of his foot and is vascular tests are still pending. I have asked him to work on these with his nursing home staff. Addendum: he has got an x-ray of the right foot done which shows that his osteopenia but no specific ostial lysis or abnormal periosteal reaction. Final impression was degenerative changes with dorsal foot soft tissue swelling. 02/16/2016 -- lower extremity arterial duplex examination shows a 50-99%  stenosis of the right tibioperoneal trunk. He had biphasic flow in the right SFA, popliteal and tibioperoneal trunk. Left-sided he had  triphasic flow throughout [name], [name] ([id number]) 02/29/2016 -- he is awaiting his vascular opinion with Dr. Gilda Crease which is to be done on April 24 03/08/2016 -- he has not kept his appointment with Dr. Gilda Crease on April 24 and does not seem to know what happened about this. it's difficult to gauge whether he is in full control of his mental faculties as at times he is extremely rude to the nursing staff. 03/22/2016 -- the patient was seen by Dr. 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 Recommended that the patient had severe atherosclerotic changes of both lower extremities associated with ulceration and tissue loss of the foot. This is a limb threatening ischemia and the patient was recommended to undergo angiography of the lower extremity with a hope for intervention for limb salvage. Patient agreed and will proceed to angiography. He was admitted to the hospital between May 2 and 03/15/2016 - he underwent induction of a catheter into his right lower extremity and third order catheter placement with contrast injection to the right lower extremity for distal runoff. Percutaneous transluminal angioplasty of the right superficial femoral and popliteal arteries were done. He also had a right peroneal angioplasty. Patient had a postoperative hematoma and was admitted for observation and in the next 24 hours he was very agitated and combative and had to be seen by psychiatric. He had a follow-up with Dr. Gilda Crease in 3 weeks. 04/18/2016 -- notes reviewed from the vascular office where he was seen for his postop visit after the PTA of the right SFA and popliteal arteries on 03/12/2016. He underwent an ABI which showed right ABI to be more than 1.3  and left to be more than 1 more than 1.3, great toe and PPG waveforms are decreased bilaterally. No additional intervention indicated at this time and the patient was to follow-up in 3 months with an ABI and bilateral lower extremity duplex study. 05/02/2016 -- he complains that his compression stockings are causing him a lot of discomfort and he is not happy wearing them. Objective Constitutional Pulse regular. Respirations normal and unlabored. Afebrile. Vitals Time Taken: 9:32 AM, Height: 70 in, Weight: 235 lbs, BMI: 33.7, Temperature: 98.1 F, Pulse: 51 bpm, Respiratory Rate: 20 breaths/min, Blood Pressure: 107/55 mmHg. Eyes Nonicteric. Reactive to light. Ears, Nose, Mouth, and Throat Lips, teeth, and gums WNL.Marland Kitchen Moist mucosa without lesions. Neck supple and nontender. No palpable supraclavicular or cervical adenopathy. Normal sized without goiter. Respiratory Azizi, Rosario V. ([id number]) WNL. No retractions.. Cardiovascular Pedal Pulses WNL. No clubbing, cyanosis or edema. Chest Breasts symmetical and no nipple discharge.. Breast tissue WNL, no masses, lumps, or tenderness.. Lymphatic No adneopathy. No adenopathy. No adenopathy. Musculoskeletal Adexa without tenderness or enlargement.. Digits and nails w/o clubbing, cyanosis, infection, petechiae, ischemia, or inflammatory conditions.Marland Kitchen Psychiatric Judgement and insight Intact.. No evidence of depression, anxiety, or agitation.. General Notes: the lymphedema both lower extremities is much less and the area has no open wounds. The wound on the right calcaneal region has a lot of eschar surrounding it and there is some debris on the ulceration which is much smaller. Using a #3 curet Sharply removed all the subcutaneous debris and the surrounding callus. Bleeding was brisk and controlled with pressure Integumentary (Hair, Skin) No suspicious lesions. No crepitus or fluctuance. No peri-wound warmth or erythema. No masses.. Wound  #1 status is Open. Original cause of wound was Gradually Appeared. The  wound is located on the Left Lower Leg. The wound measures 4cm length x 3cm width x 0.1cm depth; 9.425cm^2 area and 0.942cm^3 volume. The wound is limited to skin breakdown. There is no tunneling or undermining noted. There is a medium amount of serosanguineous drainage noted. The wound margin is flat and intact. There is large (67-100%) pink granulation within the wound bed. There is a small (1-33%) amount of necrotic tissue within the wound bed including Eschar. The periwound skin appearance exhibited: Localized Edema, Moist, Erythema. The surrounding wound skin color is noted with erythema which is circumferential. Periwound temperature was noted as No Abnormality. The periwound has tenderness on palpation. Wound #2 status is Open. Original cause of wound was Gradually Appeared. The wound is located on the Right Lower Leg. The wound measures 3cm length x 3cm width x 0.1cm depth; 7.069cm^2 area and 0.707cm^3 volume. The wound is limited to skin breakdown. There is no tunneling or undermining noted. There is a medium amount of serosanguineous drainage noted. The wound margin is flat and intact. There is large (67-100%) red granulation within the wound bed. There is a small (1-33%) amount of necrotic tissue within the wound bed including Eschar. The periwound skin appearance exhibited: Localized Edema, Moist, Erythema. The periwound skin appearance did not exhibit: Maceration. The surrounding wound skin color is noted with erythema which is circumferential. Periwound temperature was noted as No Abnormality. The periwound has tenderness on palpation. Wound #3 status is Open. Original cause of wound was Pressure Injury. The wound is located on the Right Calcaneus. The wound measures 0.6cm length x 1.8cm width x 0.2cm depth; 0.848cm^2 area and 0.17cm^3 volume. The wound is limited to skin breakdown. There is no tunneling or  undermining noted. There is a large amount of serous drainage noted. The wound margin is flat and intact. There is medium (34-66%) red, pink granulation within the wound bed. There is a medium (34-66%) amount of necrotic [name], [name] ([id number]) tissue within the wound bed including Adherent Slough. The periwound skin appearance exhibited: Localized Edema, Maceration, Moist, Erythema. The surrounding wound skin color is noted with erythema which is circumferential. Periwound temperature was noted as No Abnormality. The periwound has tenderness on palpation. Wound #6 status is Open. Original cause of wound was Pressure Injury. The wound is located on the Right,Lateral Foot. The wound measures 0.3cm length x 0.4cm width x 0.1cm depth; 0.094cm^2 area and 0.009cm^3 volume. The wound is limited to skin breakdown. There is no tunneling or undermining noted. There is a medium amount of serosanguineous drainage noted. The wound margin is flat and intact. There is no granulation within the wound bed. There is a large (67-100%) amount of necrotic tissue within the wound bed including Eschar and Adherent Slough. The periwound skin appearance exhibited: Moist. The periwound skin appearance did not exhibit: Callus, Crepitus, Excoriation, Fluctuance, Friable, Induration, Localized Edema, Rash, Scarring, Dry/Scaly, Maceration, Atrophie Blanche, Cyanosis, Ecchymosis, Hemosiderin Staining, Mottled, Pallor, Rubor, Erythema. Periwound temperature was noted as No Abnormality. The periwound has tenderness on palpation. 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 There was a Skin/Subcutaneous Tissue Debridement ([id number]) debridement with total area of 1.08 sq cm performed by Evlyn KannerBritto, Dorris Vangorder, MD. with the following instrument(s): Curette to remove Viable and Non-Viable tissue/material including Exudate, Fibrin/Slough, and Subcutaneous after achieving pain control using Other (lidocaine 4% cream). A time out was conducted prior to the start of the procedure. A Minimum amount of bleeding was controlled with Pressure. The procedure was tolerated well with a pain level of 0 throughout and a pain level of 0 following the procedure. Post Debridement Measurements: 0.6cm length x 1.8cm width x 0.3cm depth; 0.254cm^3 volume. Post debridement Stage noted as Category/Stage III. Post procedure Diagnosis Wound #3: Same as Pre-Procedure [name], [name] ([id number]) Plan Wound Cleansing: Wound #1 Left Lower Leg: May Shower, gently pat wound dry prior to applying new dressing. Wound #2 Right Lower Leg: May Shower, gently pat wound dry prior to applying new dressing. Wound #3 Right Calcaneus: May Shower, gently pat wound dry prior to applying new dressing. Wound #6 Right,Lateral Foot: May Shower, gently pat wound dry prior to applying new dressing. Anesthetic: Wound #1 Left Lower Leg: Topical Lidocaine 4% cream applied to wound bed prior to debridement - for clinic use only Topical Lidocaine 4% cream applied to wound bed prior to debridement - for clinic use only Wound #3 Right Calcaneus: Topical Lidocaine 4% cream applied to wound bed prior to debridement - for clinic use only Wound #6 Right,Lateral Foot: Topical Lidocaine 4% cream applied to wound bed prior to debridement - for clinic use only Topical Lidocaine 4% cream applied to wound bed prior to debridement - for clinic use only Skin Barriers/Peri-Wound Care: Wound #1 Left Lower  Leg: Moisturizing lotion - to legs Wound #2 Right Lower Leg: Moisturizing lotion - to legs Wound #3 Right Calcaneus: Skin Prep Barrier cream - around the heel wound to reddened and irritated areas Wound #6 Right,Lateral Foot: Skin Prep Primary Wound Dressing: Wound #1 Left Lower Leg: Aquacel Ag - Please wet with saline before removing Wound #2 Right Lower Leg: Aquacel Ag - Please wet with saline before removing Wound #3 Right Calcaneus: Aquacel Ag - Please wet with saline before removing Wound #6 Right,Lateral Foot: Aquacel Ag - Please wet with saline before removing Secondary Dressing: Wound #1 Left Lower Leg: ABD pad Conform/Kerlix Wound #2 Right Lower Leg: ABD pad Conform/Kerlix Wound #3 Right Calcaneus: [name], [name] ([id number]) Boardered Foam Dressing Wound #6 Right,Lateral Foot: Boardered Foam Dressing Dressing Change Frequency: Wound #1 Left Lower Leg: Change dressing every day. - to be changed daily by SNF RN Wound #2 Right Lower Leg: Change dressing every day. - to be changed daily by SNF RN Wound #3 Right Calcaneus: Change dressing every day. - to be changed daily by SNF RN Wound #6 Right,Lateral Foot: Change dressing every day. - to be changed daily by SNF RN Follow-up Appointments: Wound #1 Left Lower Leg: Return Appointment in 1 week. Wound #2 Right Lower Leg: Return Appointment in 1 week. Wound #3 Right Calcaneus: Return Appointment in 1 week. Wound #6 Right,Lateral Foot: Return Appointment in 1 week. Edema Control: Wound #1 Left Lower Leg: Elevate legs to the level of the heart and pump ankles as often as possible Support Garment 20-30 mm/Hg pressure to: - Please place these on in the morning before he gets up and take off only at shower time and at bedtime. Nurse to assist. Wound #2 Right Lower Leg: Elevate legs to the level of the heart and pump  ankles as often as possible Support Garment 20-30 mm/Hg pressure to: - Please place these on in  the morning before he gets up and take off only at shower time and at bedtime. Nurse to assist. Wound #6 Right,Lateral Foot: Patient to wear own Juxtalite/Juzo compression garment. - Nursing home to try and get him these. Patient says the regular socks compression is too tight and hurts. Elevate legs to the level of the heart and pump ankles as often as possible Support Garment 20-30 mm/Hg pressure to: - Please place these on in the morning before he gets up and take off only at shower time and at bedtime. Nurse to assist. Off-Loading: Wound #1 Left Lower Leg: Turn and reposition every 2 hours Other: - sage boots at night and float heel when lying in bed Wound #2 Right Lower Leg: Turn and reposition every 2 hours Other: - sage boots at night and float heel when lying in bed Wound #3 Right Calcaneus: Turn and reposition every 2 hours Other: - sage boots at night and float heel when lying in bed Wound #6 Right,Lateral Foot: Turn and reposition every 2 hours Other: - sage boots at night and float heel when lying in bed Additional Orders / Instructions: Wound #1 Left Lower Leg: [name], [name] ([id number]) Increase protein intake. Wound #2 Right Lower Leg: Increase protein intake. Wound #3 Right Calcaneus: Increase protein intake. Wound #6 Right,Lateral Foot: Increase protein intake. Medications-please add to medication list.: Wound #1 Left Lower Leg: Other: - Vitamin C, Vitamin A, Zinc, multivitamin Wound #2 Right Lower Leg: Other: - Vitamin C, Vitamin A, Zinc, multivitamin Wound #3 Right Calcaneus: Other: - Vitamin C, Vitamin A, Zinc, multivitamin Wound #6 Right,Lateral Foot: Other: - Vitamin C, Vitamin A, Zinc, multivitamin I have recommended: 1. Aquacell AG to his right heel. The patient also has a wound on the right lateral foot. This will be treated with silver alginate 2. Constant off loading and also using Sage boot. 3. he is doing well with his bilateral lower  extremity compression stockings of the 20-30 mm variety. We will order him some juxta lites which is nursing or home will have to get him. 4. High-protein diet and multivitamins including vitamin C and zinc. Electronic Signature(s) Signed: 05/16/2016 4:59:17 PM By: Evlyn Kanner MD, FACS Previous Signature: 05/16/2016 10:10:58 AM Version By: Evlyn Kanner MD, FACS Entered By: Evlyn Kanner on 05/16/2016 16:59:17 [name] ([id number]) -------------------------------------------------------------------------------- SuperBill Details Patient Name: [name] Date of Service: 05/16/2016 Medical Record Number: [id number] Patient Account Number: [id number] Date of Birth/Sex: [birth year] (70 y.o. 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 Facility Procedures CPT4 Code: [id number] Description: 11042 - DEB SUBQ TISSUE 20 SQ CM/< ICD-10 Description Diagnosis E11.621 Type 2 diabetes mellitus with foot ulcer L89.613 Pressure ulcer of right heel, stage 3 I89.0 Lymphedema, not elsewhere classified Modifier: Quantity: 1 Physician Procedures CPT4 Code: [id number] Description: 11042 - WC PHYS SUBQ TISS 20 SQ CM ICD-10 Description Diagnosis E11.621 Type 2 diabetes mellitus with foot ulcer L89.613 Pressure  ulcer of right heel, stage 3 I89.0 Lymphedema, not elsewhere classified Modifier: Quantity: 1 Electronic Signature(s) Signed:  05/16/2016 10:11:08 AM By: Evlyn Kanner MD, FACS Entered By: Evlyn Kanner on 05/16/2016 10:11:08

## 2016-05-17 NOTE — Progress Notes (Signed)
[name], [name] ([id number]) Visit Report for 05/16/2016 Arrival Information Details Patient Name: [name], [name] Date of Service: 05/16/2016 9:30 AM Medical Record Number: [id number] Patient Account Number: [id number] Date of Birth/Sex: Nov 02, 1946 (70 y.o. Male) Treating RN: Phillis Haggis Primary Care Physician: Oretha Milch Other Clinician: Referring Physician: Oretha Milch Treating Physician/Extender: Rudene Re in Treatment: 46 Visit Information History Since Last Visit All ordered tests and consults were completed: No Patient Arrived: Wheel Chair Added or deleted any medications: No Arrival Time: 09:31 Any new allergies or adverse reactions: No Accompanied By: self Had a fall or experienced change in No Transfer Assistance: Easy[employer] activities of daily living that may affect Patient Lift risk of falls: Patient Identification Verified: Yes Signs or symptoms of abuse/neglect since last No Secondary Verification Process Yes visito Completed: Hospitalized since last visit: No Patient Requires Transmission- No Pain Present Now: No Based Precautions: Patient Has Alerts: Yes Patient Alerts: DM II Electronic Signature(s) Signed: 05/16/2016 5:50:37 PM By: Alejandro Mulling Entered By: Alejandro Mulling on 05/16/2016 09:32:11 [name] ([id number]) -------------------------------------------------------------------------------- Encounter Discharge Information Details Patient Name: [name] Date of Service: 05/16/2016 9:30 AM Medical Record Number: [id number] Patient Account Number: [id number] Date of Birth/Sex: [birth year] (70 y.o. Male) Treating RN: Phillis Haggis Primary Care Physician: Oretha Milch Other Clinician: Referring Physician: Oretha Milch Treating Physician/Extender: Rudene Re in Treatment: 54 Encounter Discharge Information Items Discharge Pain Level: 0 Discharge Condition: Stable Ambulatory Status: Wheelchair Discharge  Destination: Nursing Home Transportation: Other Accompanied By: self Schedule Follow-up Appointment: Yes Medication Reconciliation completed Yes and provided to Patient/Care Bianka Liberati: Provided on Clinical Summary of Care: 05/16/2016 Form Type Recipient Paper Patient EL Electronic Signature(s) Signed: 05/16/2016 5:50:37 PM By: Alejandro Mulling Previous Signature: 05/16/2016 10:28:58 AM Version By: Gwenlyn Perking Entered By: Alejandro Mulling on 05/16/2016 11:23:23 [name] ([id number]) -------------------------------------------------------------------------------- Lower Extremity Assessment Details Patient Name: [name] Date of Service: 05/16/2016 9:30 AM Medical Record Number: [id number] Patient Account Number: [id number] Date of Birth/Sex: 09-26-1946 (70 y.o. Male) Treating RN: Phillis Haggis Primary Care Physician: Oretha Milch Other Clinician: Referring Physician: Oretha Milch Treating Physician/Extender: Rudene Re in Treatment: 19 Edema Assessment Assessed: [Left: No] [Right: No] E[Left: dema] [Right: :] Calf Left: Right: Point of Measurement: 34 cm From Medial Instep 36.6 cm 37 cm Ankle Left: Right: Point of Measurement: 12 cm From Medial Instep 24 cm 23.4 cm Vascular Assessment Pulses: Posterior Tibial Dorsalis Pedis Palpable: [Left:Yes] [Right:Yes] Extremity colors, hair growth, and conditions: Extremity Color: [Left:Hyperpigmented] [Right:Hyperpigmented] Temperature of Extremity: [Left:Warm] [Right:Warm] Capillary Refill: [Left:< 3 seconds] [Right:< 3 seconds] Electronic Signature(s) Signed: 05/16/2016 5:50:37 PM By: Alejandro Mulling Entered By: Alejandro Mulling on 05/16/2016 09:38:56 Corales, Gerrick V. ([id number]) -------------------------------------------------------------------------------- Multi Wound Chart Details Patient Name: [name] Date of Service: 05/16/2016 9:30 AM Medical Record Number: [id number] Patient Account Number:  [id number] Date of Birth/Sex: Jul 31, 1946 (70 y.o. Male) Treating RN: Phillis Haggis Primary Care Physician: Oretha Milch Other Clinician: Referring Physician: Oretha Milch Treating Physician/Extender: Rudene Re in Treatment: 19 Vital Signs Height(in): 70 Pulse(bpm): 51 Weight(lbs): 235 Blood Pressure 107/55 (mmHg): Body Mass Index(BMI): 34 Temperature(F): 98.1 Respiratory Rate 20 (breaths/min): Photos: [1:No Photos] [2:No Photos] [3:No Photos] Wound Location: [1:Left Lower Leg] [2:Right Lower Leg] [3:Right Calcaneus] Wounding Event: [1:Gradually Appeared] [2:Gradually Appeared] [3:Pressure Injury] Primary Etiology: [1:Venous Leg Ulcer] [2:Venous Leg Ulcer] [3:Pressure Ulcer] Comorbid History: [1:Cataracts, Chronic Obstructive Pulmonary Disease (COPD), Sleep Disease (COPD), Sleep Disease (COPD), Sleep Apnea, Angina, Congestive Heart Failure, Congestive Heart Failure, Congestive Heart Failure,  Hypertension, Type II Diabetes,  Gout, Osteoarthritis, Neuropathy Osteoarthritis, Neuropathy Osteoarthritis, Neuropathy] [2:Cataracts, Chronic Obstructive Pulmonary Apnea, Angina, Hypertension, Type II Diabetes, Gout,] [3:Cataracts, Chronic Obstructive Pulmonary Apnea, Angina,  Hypertension, Type II Diabetes, Gout,] Date Acquired: [1:12/31/2014] [2:12/31/2014] [3:10/31/2015] Weeks of Treatment: [1:19] [2:19] [3:19] Wound Status: [1:Open] [2:Open] [3:Open] Measurements L x W x D 4x3x0.1 [2:3x3x0.1] [3:0.6x1.8x0.2] (cm) Area (cm) : [1:9.425] [2:7.069] [3:0.848] Volume (cm) : [1:0.942] [2:0.707] [3:0.17] % Reduction in Area: [1:61.50%] [2:-2895.30%] [3:88.00%] % Reduction in Volume: 61.60% [2:-2845.80%] [3:88.00%] Classification: [1:Partial Thickness] [2:Partial Thickness] [3:Category/Stage III] HBO Classification: [1:Grade 1] [2:Grade 1] [3:Grade 1] Exudate Amount: [1:Medium] [2:Medium] [3:Large] Exudate Type: [1:Serosanguineous] [2:Serosanguineous] [3:Serous] Exudate Color:  [1:red, brown] [2:red, brown] [3:amber] Wound Margin: [1:Flat and Intact] [2:Flat and Intact] [3:Flat and Intact] Granulation Amount: [1:Large (67-100%)] [2:Large (67-100%)] [3:Medium (34-66%)] Granulation Quality: [1:Pink] [2:Red] [3:Red, Pink] Necrotic Amount: [1:Small (1-33%)] [2:Small (1-33%)] [3:Medium (34-66%)] Necrotic Tissue: Eschar Eschar Adherent Slough Exposed Structures: Fascia: No Fascia: No Fascia: No Fat: No Fat: No Fat: No Tendon: No Tendon: No Tendon: No Muscle: No Muscle: No Muscle: No Joint: No Joint: No Joint: No Bone: No Bone: No Bone: No Limited to Skin Limited to Skin Limited to Skin Breakdown Breakdown Breakdown Epithelialization: Medium (34-66%) Medium (34-66%) None Periwound Skin Texture: Edema: Yes Edema: Yes Edema: Yes Periwound Skin Moist: Yes Moist: Yes Maceration: Yes Moisture: Maceration: No Moist: Yes Periwound Skin Color: Erythema: Yes Erythema: Yes Erythema: Yes Erythema Location: Circumferential Circumferential Circumferential Temperature: No Abnormality No Abnormality No Abnormality Tenderness on Yes Yes Yes Palpation: Wound Preparation: Ulcer Cleansing: Other: Ulcer Cleansing: Other: Ulcer Cleansing: soap and water soap and water Rinsed/Irrigated with Saline Topical Anesthetic Topical Anesthetic Applied: None Applied: None Topical Anesthetic Applied: Other: lidocaine 4% Wound Number: 6 N/A N/A Photos: No Photos N/A N/A Wound Location: Right Foot - Lateral N/A N/A Wounding Event: Pressure Injury N/A N/A Primary Etiology: Pressure Ulcer N/A N/A Comorbid History: Cataracts, Chronic N/A N/A Obstructive Pulmonary Disease (COPD), Sleep Apnea, Angina, Congestive Heart Failure, Hypertension, Type II Diabetes, Gout, Osteoarthritis, Neuropathy Date Acquired: 02/16/2016 N/A N/A Weeks of Treatment: 12 N/A N/A Wound Status: Open N/A N/A Measurements L x W x D 0.3x0.4x0.1 N/A N/A (cm) Area (cm) : 0.094 N/A N/A Volume (cm) :  0.009 N/A N/A % Reduction in Area: 40.10% N/A N/A % Reduction in Volume: 43.80% N/A N/A Classification: Category/Stage II N/A N/A HBO Classification: Grade 1 N/A N/A Plasencia, Zacary V. ([id number]) Exudate Amount: Medium N/A N/A Exudate Type: Serosanguineous N/A N/A Exudate Color: red, brown N/A N/A Wound Margin: Flat and Intact N/A N/A Granulation Amount: None Present (0%) N/A N/A Granulation Quality: N/A N/A N/A Necrotic Amount: Large (67-100%) N/A N/A Necrotic Tissue: Eschar, Adherent Slough N/A N/A Exposed Structures: Fascia: No N/A N/A Fat: No Tendon: No Muscle: No Joint: No Bone: No Limited to Skin Breakdown Epithelialization: Medium (34-66%) N/A N/A Periwound Skin Texture: Edema: No N/A N/A Excoriation: No Induration: No Callus: No Crepitus: No Fluctuance: No Friable: No Rash: No Scarring: No Periwound Skin Moist: Yes N/A N/A Moisture: Maceration: No Dry/Scaly: No Periwound Skin Color: Atrophie Blanche: No N/A N/A Cyanosis: No Ecchymosis: No Erythema: No Hemosiderin Staining: No Mottled: No Pallor: No Rubor: No Erythema Location: N/A N/A N/A Temperature: No Abnormality N/A N/A Tenderness on Yes N/A N/A Palpation: Wound Preparation: Ulcer Cleansing: N/A N/A Rinsed/Irrigated with Saline Topical Anesthetic Applied: Other: lidocaine 4% Treatment Notes SHAKEEM, STERN ([id number]) Electronic Signature(s) Signed: 05/16/2016 5:50:37 PM By: Alejandro Mulling Entered By: Alejandro Mulling on 05/16/2016  09:52:44 [name], [name] ([id number]) -------------------------------------------------------------------------------- Multi-Disciplinary Care Plan Details Patient Name: [name], [name] Date of Service: 05/16/2016 9:30 AM Medical Record Number: [id number] Patient Account Number: [id number] Date of Birth/Sex: 01-02-1946 (70 y.o. Male) Treating RN: Phillis Haggis Primary Care Physician: Oretha Milch Other Clinician: Referring Physician: Oretha Milch Treating  Physician/Extender: Rudene Re in Treatment: 39 Active Inactive Abuse / Safety / Falls / Self Care Management Nursing Diagnoses: Potential for falls Goals: Patient will remain injury free Date Initiated: 03/01/2016 Goal Status: Active Interventions: Assess fall risk on admission and as needed Notes: Nutrition Nursing Diagnoses: Imbalanced nutrition Goals: Patient/caregiver agrees to and verbalizes understanding of need to use nutritional supplements and/or vitamins as prescribed Date Initiated: 03/01/2016 Goal Status: Active Interventions: Assess patient nutrition upon admission and as needed per policy Notes: Orientation to the Wound Care Program Nursing Diagnoses: Knowledge deficit related to the wound healing center program Goals: Patient/caregiver will verbalize understanding of the [facility] [address] MIKAEL, SKODA ([id number]) Date Initiated: 03/01/2016 Goal Status: Active Interventions: Provide education on orientation to the wound center Notes: Pain, Acute or Chronic Nursing Diagnoses: Pain, acute or chronic: actual or potential Potential alteration in comfort, pain Goals: Patient will verbalize adequate pain control and receive pain control interventions during procedures as needed Date Initiated: 03/01/2016 Goal Status: Active Patient/caregiver will verbalize adequate pain control between visits Date Initiated: 03/01/2016 Goal Status: Active Interventions: Assess comfort goal upon admission Complete pain assessment as per visit requirements Notes: Pressure Nursing Diagnoses: Knowledge deficit related to causes and risk factors for pressure ulcer development Knowledge deficit related to management of pressures ulcers Goals: Patient/caregiver will verbalize risk factors for pressure ulcer development Date Initiated: 03/01/2016 Goal Status: Active Interventions: Assess offloading mechanisms upon admission and as  needed Notes: Wound/Skin Impairment Nursing Diagnoses: KACI, FREEL ([id number]) Impaired tissue integrity Goals: Ulcer/skin breakdown will have a volume reduction of 30% by week 4 Date Initiated: 03/01/2016 Goal Status: Active Ulcer/skin breakdown will have a volume reduction of 50% by week 8 Date Initiated: 03/01/2016 Goal Status: Active Ulcer/skin breakdown will have a volume reduction of 80% by week 12 Date Initiated: 03/01/2016 Goal Status: Active Interventions: Assess ulceration(s) every visit Notes: Electronic Signature(s) Signed: 05/16/2016 5:50:37 PM By: Alejandro Mulling Entered By: Alejandro Mulling on 05/16/2016 09:52:01 Wajda, Jayquon V. ([id number]) -------------------------------------------------------------------------------- Pain Assessment Details Patient Name: [name] Date of Service: 05/16/2016 9:30 AM Medical Record Number: [id number] Patient Account Number: [id number] Date of Birth/Sex: [birth year] (69 y.o. Male) Treating RN: Phillis Haggis Primary Care Physician: Oretha Milch Other Clinician: Referring Physician: Oretha Milch Treating Physician/Extender: Rudene Re in Treatment: 50 Active Problems Location of Pain Severity and Description of Pain Patient Has Paino No Site Locations With Dressing Change: No Pain Management and Medication Current Pain Management: Electronic Signature(s) Signed: 05/16/2016 5:50:37 PM By: Alejandro Mulling Entered By: Alejandro Mulling on 05/16/2016 09:32:19 [name] ([id number]) -------------------------------------------------------------------------------- Patient/Caregiver Education Details Patient Name: [name] Date of Service: 05/16/2016 9:30 AM Medical Record Number: [id number] Patient Account Number: [id number] Date of Birth/Gender: 1946/09/16 (70 y.o. Male) Treating RN: Phillis Haggis Primary Care Physician: Oretha Milch Other Clinician: Referring Physician: Oretha Milch Treating  Physician/Extender: Rudene Re in Treatment: 44 Education Assessment Education Provided To: Patient Education Topics Provided Wound/Skin Impairment: Handouts: Other: change dressings as directed Methods: Demonstration, Explain/Verbal Responses: State content correctly Electronic Signature(s) Signed: 05/16/2016 5:50:37 PM By: Alejandro Mulling Entered By: Alejandro Mulling on 05/16/2016 09:51:40 Pfost, Tobby Seth Bake ([id number]) -------------------------------------------------------------------------------- Wound Assessment Details Patient Name:  Hebert, Kaladin V. Date of Service: 05/16/2016 9:30 AM Medical Record Number: [id number] Patient Account Number: [id number] Date of Birth/Sex: 05-Jan-1946 89(70 y.o. Male) Treating RN: Phillis HaggisPinkerton, Debi Primary Care Physician: Oretha MilchSMITH, [name] Other Clinician: Referring Physician: Oretha MilchSMITH, [name] Treating Physician/Extender: Rudene ReBritto, Errol Weeks in Treatment: 19 Wound Status Wound Number: 1 Primary Venous Leg Ulcer Etiology: Wound Location: Left Lower Leg Wound Open Wounding Event: Gradually Appeared Status: Date Acquired: 12/31/2014 Comorbid Cataracts, Chronic Obstructive Weeks Of Treatment: 19 History: Pulmonary Disease (COPD), Sleep Clustered Wound: No Apnea, Angina, Congestive Heart Failure, Hypertension, Type II Diabetes, Gout, Osteoarthritis, Neuropathy Photos Photo Uploaded By: Alejandro MullingPinkerton, Debra on 05/16/2016 11:36:48 Wound Measurements Length: (cm) 4 Width: (cm) 3 Depth: (cm) 0.1 Area: (cm) 9.425 Volume: (cm) 0.942 % Reduction in Area: 61.5% % Reduction in Volume: 61.6% Epithelialization: Medium (34-66%) Tunneling: No Undermining: No Wound Description Classification: Partial Thickness Foul Odor A Diabetic Severity (Wagner): Grade 1 Wound Margin: Flat and Intact Exudate Amount: Medium Exudate Type: Serosanguineous Exudate Color: red, brown fter Cleansing: No Wound Bed Granulation Amount: Large (67-100%) Exposed  Structure Stansel, Kayler V. ([id number]) Granulation Quality: Pink Fascia Exposed: No Necrotic Amount: Small (1-33%) Fat Layer Exposed: No Necrotic Quality: Eschar Tendon Exposed: No Muscle Exposed: No Joint Exposed: No Bone Exposed: No Limited to Skin Breakdown Periwound Skin Texture Texture Color No Abnormalities Noted: No No Abnormalities Noted: No Localized Edema: Yes Erythema: Yes Erythema Location: Circumferential Moisture No Abnormalities Noted: No Temperature / Pain Moist: Yes Temperature: No Abnormality Tenderness on Palpation: Yes Wound Preparation Ulcer Cleansing: Other: soap and water, Topical Anesthetic Applied: None Treatment Notes Wound #1 (Left Lower Leg) 1. Cleansed with: Clean wound with Normal Saline Cleanse wound with antibacterial soap and water 3. Peri-wound Care: Moisturizing lotion 4. Dressing Applied: Aquacel Ag 5. Secondary Dressing Applied ABD Pad Kerlix/Conform 7. Secured with Secretary/administratorTape Electronic Signature(s) Signed: 05/16/2016 5:50:37 PM By: Alejandro MullingPinkerton, Debra Entered By: Alejandro MullingPinkerton, Debra on 05/16/2016 09:46:38 [name] OttoLINDLEY, [name] ([id number]) -------------------------------------------------------------------------------- Wound Assessment Details Patient Name: [name] OttoLINDLEY, [name] V. Date of Service: 05/16/2016 9:30 AM Medical Record Number: [id number] Patient Account Number: [id number] Date of Birth/Sex: 05-Jan-1946 [age] y.o. Male) Treating RN: Phillis HaggisPinkerton, Debi Primary Care Physician: Oretha MilchSMITH, [name] Other Clinician: Referring Physician: Oretha MilchSMITH, [name] Treating Physician/Extender: Rudene ReBritto, Errol Weeks in Treatment: 19 Wound Status Wound Number: 2 Primary Venous Leg Ulcer Etiology: Wound Location: Right Lower Leg Wound Open Wounding Event: Gradually Appeared Status: Date Acquired: 12/31/2014 Comorbid Cataracts, Chronic Obstructive Weeks Of Treatment: 19 History: Pulmonary Disease (COPD), Sleep Clustered Wound: No Apnea, Angina, Congestive  Heart Failure, Hypertension, Type II Diabetes, Gout, Osteoarthritis, Neuropathy Photos Photo Uploaded By: Alejandro MullingPinkerton, Debra on 05/16/2016 11:36:48 Wound Measurements Length: (cm) 3 Width: (cm) 3 Depth: (cm) 0.1 Area: (cm) 7.069 Volume: (cm) 0.707 % Reduction in Area: -2895.3% % Reduction in Volume: -2845.8% Epithelialization: Medium (34-66%) Tunneling: No Undermining: No Wound Description Classification: Partial Thickness Foul Odor A Diabetic Severity (Wagner): Grade 1 Wound Margin: Flat and Intact Exudate Amount: Medium Exudate Type: Serosanguineous Exudate Color: red, brown fter Cleansing: No Wound Bed Granulation Amount: Large (67-100%) Exposed Structure Kisling, Price V. ([id number]) Granulation Quality: Red Fascia Exposed: No Necrotic Amount: Small (1-33%) Fat Layer Exposed: No Necrotic Quality: Eschar Tendon Exposed: No Muscle Exposed: No Joint Exposed: No Bone Exposed: No Limited to Skin Breakdown Periwound Skin Texture Texture Color No Abnormalities Noted: No No Abnormalities Noted: No Localized Edema: Yes Erythema: Yes Erythema Location: Circumferential Moisture No Abnormalities Noted: No Temperature / Pain Maceration: No Temperature: No Abnormality Moist: Yes Tenderness on Palpation: Yes Wound  Preparation Ulcer Cleansing: Other: soap and water, Topical Anesthetic Applied: None Treatment Notes Wound #2 (Right Lower Leg) 1. Cleansed with: Clean wound with Normal Saline Cleanse wound with antibacterial soap and water 3. Peri-wound Care: Moisturizing lotion 4. Dressing Applied: Aquacel Ag 5. Secondary Dressing Applied ABD Pad Kerlix/Conform 7. Secured with Secretary/administrator) Signed: 05/16/2016 5:50:37 PM By: Alejandro Mulling Entered By: Alejandro Mulling on 05/16/2016 09:47:01 [name] ([id number]) -------------------------------------------------------------------------------- Wound Assessment Details Patient Name:  [name] Date of Service: 05/16/2016 9:30 AM Medical Record Number: [id number] Patient Account Number: [id number] Date of Birth/Sex: 1946-08-24 (69 y.o. Male) Treating RN: Phillis Haggis Primary Care Physician: Oretha Milch Other Clinician: Referring Physician: Oretha Milch Treating Physician/Extender: Rudene Re in Treatment: 19 Wound Status Wound Number: 3 Primary Pressure Ulcer Etiology: Wound Location: Right Calcaneus Wound Open Wounding Event: Pressure Injury Status: Date Acquired: 10/31/2015 Comorbid Cataracts, Chronic Obstructive Weeks Of Treatment: 19 History: Pulmonary Disease (COPD), Sleep Clustered Wound: No Apnea, Angina, Congestive Heart Failure, Hypertension, Type II Diabetes, Gout, Osteoarthritis, Neuropathy Photos Photo Uploaded By: Alejandro Mulling on 05/16/2016 11:37:37 Wound Measurements Length: (cm) 0.6 Width: (cm) 1.8 Depth: (cm) 0.2 Area: (cm) 0.848 Volume: (cm) 0.17 % Reduction in Area: 88% % Reduction in Volume: 88% Epithelialization: None Tunneling: No Undermining: No Wound Description Classification: Category/Stage III Foul Odor A Diabetic Severity (Wagner): Grade 1 Wound Margin: Flat and Intact Exudate Amount: Large Exudate Type: Serous Exudate Color: amber fter Cleansing: No Wound Bed Granulation Amount: Medium (34-66%) Exposed Structure Abboud, Fady V. ([id number]) Granulation Quality: Red, Pink Fascia Exposed: No Necrotic Amount: Medium (34-66%) Fat Layer Exposed: No Necrotic Quality: Adherent Slough Tendon Exposed: No Muscle Exposed: No Joint Exposed: No Bone Exposed: No Limited to Skin Breakdown Periwound Skin Texture Texture Color No Abnormalities Noted: No No Abnormalities Noted: No Localized Edema: Yes Erythema: Yes Erythema Location: Circumferential Moisture No Abnormalities Noted: No Temperature / Pain Maceration: Yes Temperature: No Abnormality Moist: Yes Tenderness on Palpation: Yes Wound  Preparation Ulcer Cleansing: Rinsed/Irrigated with Saline Topical Anesthetic Applied: Other: lidocaine 4%, Treatment Notes Wound #3 (Right Calcaneus) 1. Cleansed with: Clean wound with Normal Saline 2. Anesthetic Topical Lidocaine 4% cream to wound bed prior to debridement 3. Peri-wound Care: Barrier cream Skin Prep 4. Dressing Applied: Aquacel Ag 5. Secondary Dressing Applied Bordered Foam Dressing Electronic Signature(s) Signed: 05/16/2016 5:50:37 PM By: Alejandro Mulling Entered By: Alejandro Mulling on 05/16/2016 09:48:06 Dowler, Nylen V. ([id number]) -------------------------------------------------------------------------------- Wound Assessment Details Patient Name: [name] Date of Service: 05/16/2016 9:30 AM Medical Record Number: [id number] Patient Account Number: [id number] Date of Birth/Sex: 07-21-1946 (70 y.o. Male) Treating RN: Phillis Haggis Primary Care Physician: Oretha Milch Other Clinician: Referring Physician: Oretha Milch Treating Physician/Extender: Rudene Re in Treatment: 19 Wound Status Wound Number: 6 Primary Pressure Ulcer Etiology: Wound Location: Right Foot - Lateral Wound Open Wounding Event: Pressure Injury Status: Date Acquired: 02/16/2016 Comorbid Cataracts, Chronic Obstructive Weeks Of Treatment: 12 History: Pulmonary Disease (COPD), Sleep Clustered Wound: No Apnea, Angina, Congestive Heart Failure, Hypertension, Type II Diabetes, Gout, Osteoarthritis, Neuropathy Photos Photo Uploaded By: Alejandro Mulling on 05/16/2016 11:37:38 Wound Measurements Length: (cm) 0.3 Width: (cm) 0.4 Depth: (cm) 0.1 Area: (cm) 0.094 Volume: (cm) 0.009 % Reduction in Area: 40.1% % Reduction in Volume: 43.8% Epithelialization: Medium (34-66%) Tunneling: No Undermining: No Wound Description Classification: Category/Stage II Diabetic Severity [address]): Grade 1 Wound Margin: Flat and Intact Exudate Amount: Medium Exudate Type:  Serosanguineous Exudate Color: red, brown Foul Odor After Cleansing: No Wound Bed Granulation  Amount: None Present (0%) Exposed Structure Blauvelt, Amarius V. ([id number]) Necrotic Amount: Large (67-100%) Fascia Exposed: No Necrotic Quality: Eschar, Adherent Slough Fat Layer Exposed: No Tendon Exposed: No Muscle Exposed: No Joint Exposed: No Bone Exposed: No Limited to Skin Breakdown Periwound Skin Texture Texture Color No Abnormalities Noted: No No Abnormalities Noted: No Callus: No Atrophie Blanche: No Crepitus: No Cyanosis: No Excoriation: No Ecchymosis: No Fluctuance: No Erythema: No Friable: No Hemosiderin Staining: No Induration: No Mottled: No Localized Edema: No Pallor: No Rash: No Rubor: No Scarring: No Temperature / Pain Moisture Temperature: No Abnormality No Abnormalities Noted: No Tenderness on Palpation: Yes Dry / Scaly: No Maceration: No Moist: Yes Wound Preparation Ulcer Cleansing: Rinsed/Irrigated with Saline Topical Anesthetic Applied: Other: lidocaine 4%, Treatment Notes Wound #6 (Right, Lateral Foot) 1. Cleansed with: Clean wound with Normal Saline Cleanse wound with antibacterial soap and water 2. Anesthetic Topical Lidocaine 4% cream to wound bed prior to debridement 3. Peri-wound Care: Skin Prep 4. Dressing Applied: Aquacel Ag 5. Secondary Dressing Applied Bordered Foam Dressing Electronic Signature(s) Signed: 05/16/2016 5:50:37 PM By: Evette GeorgesPinkerton, Debra Golebiewski, Deundre V. ([id number]) Entered By: Alejandro MullingPinkerton, Debra on 05/16/2016 09:48:29 [name] OttoLINDLEY, [name] ([id number]) -------------------------------------------------------------------------------- Vitals Details Patient Name: [name] OttoLINDLEY, Salil V. Date of Service: 05/16/2016 9:30 AM Medical Record Number: [id number] Patient Account Number: [id number] Date of Birth/Sex: 29-Oct-1946 [age] y.o. Male) Treating RN: Phillis HaggisPinkerton, Debi Primary Care Physician: Oretha MilchSMITH, [name] Other Clinician: Referring  Physician: Oretha MilchSMITH, [name] Treating Physician/Extender: Rudene ReBritto, Errol Weeks in Treatment: 2719 Vital Signs Time Taken: 09:32 Temperature (F): 98.1 Height (in): 70 Pulse (bpm): 51 Weight (lbs): 235 Respiratory Rate (breaths/min): 20 Body Mass Index (BMI): 33.7 Blood Pressure (mmHg): 107/55 Reference Range: 80 - 120 mg / dl Electronic Signature(s) Signed: 05/16/2016 5:50:37 PM By: Alejandro MullingPinkerton, Debra Entered By: Alejandro MullingPinkerton, Debra on 05/16/2016 09:34:26

## 2016-05-23 ENCOUNTER — Encounter (HOSPITAL_BASED_OUTPATIENT_CLINIC_OR_DEPARTMENT_OTHER): Payer: Medicare Other | Admitting: General Surgery

## 2016-05-23 ENCOUNTER — Encounter: Payer: Self-pay | Admitting: General Surgery

## 2016-05-23 DIAGNOSIS — E11621 Type 2 diabetes mellitus with foot ulcer: Secondary | ICD-10-CM | POA: Diagnosis not present

## 2016-05-23 DIAGNOSIS — L89613 Pressure ulcer of right heel, stage 3: Secondary | ICD-10-CM | POA: Diagnosis not present

## 2016-05-23 DIAGNOSIS — L89614 Pressure ulcer of right heel, stage 4: Secondary | ICD-10-CM

## 2016-05-23 NOTE — Progress Notes (Signed)
Pressure ulcer right heel debride.  RX with alginate adily

## 2016-05-24 NOTE — Progress Notes (Addendum)
ARNET, HOFFERBER ([id number]) Visit Report for 05/23/2016 Chief Complaint Document Details Patient Name: [name], [name] Date of Service: 05/23/2016 2:15 PM Medical Record Number: [id number] Patient Account Number: [id number] Date of Birth/Sex: 11-May-1946 (70 y.o. Male) Treating RN: Phillis Haggis Primary Care Physician: Oretha Milch Other Clinician: Referring Physician: Oretha Milch Treating Physician/Extender: Elayne Snare in Treatment: 20 Information Obtained from: Patient Chief Complaint Patient is at the clinic for treatment of an open pressure ulcer the left upper thigh and gluteal region and the right heel with bilateral swelling of the legs all of it which is going on for over a year Electronic Signature(s) Signed: 05/23/2016 3:24:49 PM By: Ardath Sax MD Previous Signature: 05/23/2016 3:19:13 PM Version By: Ardath Sax MD Entered By: Ardath Sax on 05/23/2016 15:24:48 [name] ([id number]) -------------------------------------------------------------------------------- Debridement Details Patient Name: [name] Date of Service: 05/23/2016 2:15 PM Medical Record Number: [id number] Patient Account Number: [id number] Date of Birth/Sex: 03/29/1946 (70 y.o. Male) Treating RN: Phillis Haggis Primary Care Physician: Oretha Milch Other Clinician: Referring Physician: Oretha Milch Treating Physician/Extender: Elayne Snare in Treatment: 20 Debridement Performed for Wound #3 Right Calcaneus Assessment: Performed By: Physician Ardath Sax, MD Debridement: Debridement Pre-procedure Yes Verification/Time Out Taken: Start Time: 14:42 Pain Control: Other : lidocaine 4% cream Level: Skin/Subcutaneous Tissue Total Area Debrided (L x 0.6 (cm) x 1.5 (cm) = 0.9 (cm) W): Tissue and other Viable, Non-Viable, Exudate, Fibrin/Slough, Subcutaneous material debrided: Instrument: Scissors Bleeding: Minimum Hemostasis Achieved: Pressure End Time:  14:44 Procedural Pain: 0 Post Procedural Pain: 0 Response to Treatment: Procedure was tolerated well Post Debridement Measurements of Total Wound Length: (cm) 0.6 Stage: Category/Stage III Width: (cm) 1.5 Depth: (cm) 0.2 Volume: (cm) 0.141 Post Procedure Diagnosis Same as Pre-procedure Electronic Signature(s) Signed: 05/23/2016 4:15:37 PM By: Alejandro Mulling Signed: 05/23/2016 4:50:06 PM By: Ardath Sax MD Entered By: Alejandro Mulling on 05/23/2016 14:46:36 Gudino, Mannie V. ([id number]) -------------------------------------------------------------------------------- HPI Details Patient Name: [name] Date of Service: 05/23/2016 2:15 PM Medical Record Number: [id number] Patient Account Number: [id number] Date of Birth/Sex: 05-Dec-1945 (70 y.o. Male) Treating RN: Phillis Haggis Primary Care Physician: Oretha Milch Other Clinician: Referring Physician: Oretha Milch Treating Physician/Extender: Ardath Sax Weeks in Treatment: 20 History of Present Illness Location: ulcerated area on the right heel, left gluteal region and thigh and then bilateral lower extremities Quality: Patient reports experiencing a sharp pain to affected area(s). Severity: Patient states wound are getting worse. Duration: Patient has had the wound for > 12 months prior to seeking treatment at the wound center Timing: Pain in wound is constant (hurts all the time) Context: The wound appeared gradually over time Modifying Factors: Other treatment(s) tried include: he sees his heart doctor and his primary care doctor Associated Signs and Symptoms: patient has not been able to walk for over a year now HPI Description: 70 year old gentleman with a known history of hypertension, diabetes, obstructive sleep apnea, COPD, diastolic CHF, coronary artery disease was admitted to the hospital with sepsis from an ulcer of the right heel and was treated there in October 2016. he also has chronic bilateral lower  extremity edema and lymphedema. He had received vancomycin, Zosyn and at that stage and x-rays showed hardware in the right ankle but no evidence of osteomyelitis. he was a former smoker. he was also treated with Augmentin orally for 10 days. He is either bedbound or wheelchair-bound and does not ambulate by himself. 01/19/2016 -- he has not been seen here for 3 weeks and this was  because he was admitted to [facility] between 223 and 01/08/2016 for sepsis, UTI and pneumonia involving the left lung. he was treated with IV vancomycin and Zosyn and then meropenem. Was discharged home on oral Bactrim for 2 weeks none of his vascular test or x-rays were done and we will reorder these. 01/26/2016 -- he has not yet done the x-ray of his foot and is vascular tests are still pending. I have asked him to work on these with his nursing home staff. Addendum: he has got an x-ray of the right foot done which shows that his osteopenia but no specific ostial lysis or abnormal periosteal reaction. Final impression was degenerative changes with dorsal foot soft tissue swelling. 02/16/2016 -- lower extremity arterial duplex examination shows a 50-99% stenosis of the right tibioperoneal trunk. He had biphasic flow in the right SFA, popliteal and tibioperoneal trunk. Left-sided he had triphasic flow throughout 02/29/2016 -- he is awaiting his vascular opinion with Dr. Gilda Crease which is to be done on April 24 03/08/2016 -- he has not kept his appointment with Dr. Gilda Crease on April 24 and does not seem to know what happened about this. it's difficult to gauge whether he is in full control of his mental faculties as at times he is extremely rude to the nursing staff. 03/22/2016 -- the patient was seen by Dr. 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 Recommended that the patient had  severe atherosclerotic changes of both lower extremities associated with ulceration and tissue loss of the foot. This is a limb threatening ischemia and the patient was recommended to undergo Crislip, Cledith V. ([id number]) angiography of the lower extremity with a hope for intervention for limb salvage. Patient agreed and will proceed to angiography. He was admitted to the hospital between May 2 and 03/15/2016 - he underwent induction of a catheter into his right lower extremity and third order catheter placement with contrast injection to the right lower extremity for distal runoff. Percutaneous transluminal angioplasty of the right superficial femoral and popliteal arteries were done. He also had a right peroneal angioplasty. Patient had a postoperative hematoma and was admitted for observation and in the next 24 hours he was very agitated and combative and had to be seen by psychiatric. He had a follow-up with Dr. Gilda Crease in 3 weeks. 04/18/2016 -- notes reviewed from the vascular office where he was seen for his postop visit after the PTA of the right SFA and popliteal arteries on 03/12/2016. He underwent an ABI which showed right ABI to be more than 1.3 and left to be more than 1 more than 1.3, great toe and PPG waveforms are decreased bilaterally. No additional intervention indicated at this time and the patient was to follow-up in 3 months with an ABI and bilateral lower extremity duplex study. 05/02/2016 -- he complains that his compression stockings are causing him a lot of discomfort and he is not happy wearing them. Electronic Signature(s) Signed: 05/23/2016 3:25:20 PM By: Ardath Sax MD Previous Signature: 05/23/2016 3:19:49 PM Version By: Ardath Sax MD Entered By: Ardath Sax on 05/23/2016 15:25:20 [name] ([id number]) -------------------------------------------------------------------------------- Physical Exam Details Patient Name: [name] Date of  Service: 05/23/2016 2:15 PM Medical Record Number: [id number] Patient Account Number: [id number] Date of Birth/Sex: [birth year] (70 y.o. Male) Treating RN: Phillis Haggis Primary Care Physician: Oretha Milch Other Clinician: Referring Physician: Oretha Milch Treating  Physician/Extender: Ardath Sax Weeks in Treatment: 20 Electronic Signature(s) Signed: 05/23/2016 3:25:44 PM By: Ardath Sax MD Previous Signature: 05/23/2016 3:19:59 PM Version By: Ardath Sax MD Entered By: Ardath Sax on 05/23/2016 15:25:44 [name] ([id number]) -------------------------------------------------------------------------------- Physician Orders Details Patient Name: [name] Date of Service: 05/23/2016 2:15 PM Medical Record Number: [id number] Patient Account Number: [id number] Date of Birth/Sex: 03-15-1946 (70 y.o. Male) Treating RN: Phillis Haggis Primary Care Physician: Oretha Milch Other Clinician: Referring Physician: Oretha Milch Treating Physician/Extender: Elayne Snare in Treatment: 20 Verbal / Phone Orders: Yes Clinician: Ashok Cordia, Debi Read Back and Verified: Yes Diagnosis Coding Wound Cleansing Wound #1 Left Lower Leg o May Shower, gently pat wound dry prior to applying new dressing. Wound #2 Right Lower Leg o May Shower, gently pat wound dry prior to applying new dressing. Wound #3 Right Calcaneus o May Shower, gently pat wound dry prior to applying new dressing. Wound #6 Right,Lateral Foot o May Shower, gently pat wound dry prior to applying new dressing. Anesthetic Wound #1 Left Lower Leg o Topical Lidocaine 4% cream applied to wound bed prior to debridement - for clinic use only o Topical Lidocaine 4% cream applied to wound bed prior to debridement - for clinic use only Wound #3 Right Calcaneus o Topical Lidocaine 4% cream applied to wound bed prior to debridement - for clinic use only Wound #6 Right,Lateral Foot o Topical Lidocaine 4%  cream applied to wound bed prior to debridement - for clinic use only o Topical Lidocaine 4% cream applied to wound bed prior to debridement - for clinic use only Skin Barriers/Peri-Wound Care Wound #1 Left Lower Leg o Moisturizing lotion - to legs Wound #2 Right Lower Leg o Moisturizing lotion - to legs Wound #3 Right Calcaneus o Skin Prep o Barrier cream - around the heel wound to reddened and irritated areas Wound #6 Right,Lateral Foot Depolo, Craige V. ([id number]) o Skin Prep Primary Wound Dressing Wound #1 Left Lower Leg o Aquacel Ag - Please wet with saline before removing Wound #2 Right Lower Leg o Aquacel Ag - Please wet with saline before removing Wound #3 Right Calcaneus o Aquacel Ag - Please wet with saline before removing Wound #6 Right,Lateral Foot o Aquacel Ag - Please wet with saline before removing Secondary Dressing Wound #1 Left Lower Leg o ABD pad o Conform/Kerlix Wound #2 Right Lower Leg o ABD pad o Conform/Kerlix Wound #3 Right Calcaneus o Boardered Foam Dressing Wound #6 Right,Lateral Foot o Boardered Foam Dressing Dressing Change Frequency Wound #1 Left Lower Leg o Change dressing every day. - to be changed daily by SN[facility] RN Wound #2 Right Lower Leg o Change dressing every day. - to be changed daily by SNF RN Wound #3 Right Calcaneus o Change dressing every day. - to be changed daily by SNF RN Wound #6 Right,Lateral Foot o Change dressing every day. - to be changed daily by SNF RN Follow-up Appointments Wound #1 Left Lower Leg o Return Appointment in 1 week. Wound #2 Right Lower Leg Buffone, Selwyn V. ([id number]) o Return Appointment in 1 week. Wound #3 Right Calcaneus o Return Appointment in 1 week. Wound #6 Right,Lateral Foot o Return Appointment in 1 week. Edema Control Wound #1 Left Lower Leg o Elevate legs to the level of the heart and pump ankles as often as possible o Support Garment  20-30 mm/Hg pressure to: - Please place these on in the morning before he gets up and take off only at shower time  and at bedtime. Nurse to assist. Wound #2 Right Lower Leg o Elevate legs to the level of the heart and pump ankles as often as possible o Support Garment 20-30 mm/Hg pressure to: - Please place these on in the morning before he gets up and take off only at shower time and at bedtime. Nurse to assist. Wound #6 Right,Lateral Foot o Patient to wear own Juxtalite/Juzo compression garment. - Nursing home to try and get him these. Patient says the regular socks compression is too tight and hurts. o Elevate legs to the level of the heart and pump ankles as often as possible o Support Garment 20-30 mm/Hg pressure to: - Please place these on in the morning before he gets up and take off only at shower time and at bedtime. Nurse to assist. Off-Loading Wound #1 Left Lower Leg o Turn and reposition every 2 hours o Other: - sage boots at night and float heel when lying in bed Wound #2 Right Lower Leg o Turn and reposition every 2 hours o Other: - sage boots at night and float heel when lying in bed Wound #3 Right Calcaneus o Turn and reposition every 2 hours o Other: - sage boots at night and float heel when lying in bed Wound #6 Right,Lateral Foot o Turn and reposition every 2 hours o Other: - sage boots at night and float heel when lying in bed Additional Orders / Instructions Keahey, Olsen V. ([id number]) Wound #1 Left Lower Leg o Increase protein intake. Wound #2 Right Lower Leg o Increase protein intake. Wound #3 Right Calcaneus o Increase protein intake. Wound #6 Right,Lateral Foot o Increase protein intake. Medications-please add to medication list. Wound #1 Left Lower Leg o Other: - Vitamin C, Vitamin A, Zinc, multivitamin Wound #2 Right Lower Leg o Other: - Vitamin C, Vitamin A, Zinc, multivitamin Wound #3 Right Calcaneus o  Other: - Vitamin C, Vitamin A, Zinc, multivitamin Wound #6 Right,Lateral Foot o Other: - Vitamin C, Vitamin A, Zinc, multivitamin Notes Please order Juxtalite Velcro compression wraps for bilateral legs please. Electronic Signature(s) Signed: 05/23/2016 4:15:37 PM By: Alejandro Mulling Signed: 05/23/2016 4:50:06 PM By: Ardath Sax MD Entered By: Alejandro Mulling on 05/23/2016 14:47:57 [name] ([id number]) -------------------------------------------------------------------------------- Problem List Details Patient Name: [name] Date of Service: 05/23/2016 2:15 PM Medical Record Number: [id number] Patient Account Number: [id number] Date of Birth/Sex: 1946/02/26 (70 y.o. Male) Treating RN: Phillis Haggis Primary Care Physician: Oretha Milch Other Clinician: Referring Physician: Oretha Milch Treating Physician/Extender: Elayne Snare in Treatment: 20 Active Problems ICD-10 Encounter Code Description Active Date Diagnosis E11.621 Type 2 diabetes mellitus with foot ulcer 01/01/2016 Yes L89.613 Pressure ulcer of right heel, stage 3 01/01/2016 Yes E66.01 Morbid (severe) obesity due to excess calories 01/01/2016 Yes I89.0 Lymphedema, not elsewhere classified 01/01/2016 Yes M70.871 Other soft tissue disorders related to use, overuse and 01/19/2016 Yes pressure, right ankle and foot 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/16/2016 Yes ulceration of heel and midfoot Inactive Problems Resolved Problems ICD-10 Code Description Active Date Resolved Date L89.322 Pressure ulcer of left buttock, stage 2 01/01/2016 01/01/2016 Electronic Signature(s) Signed: 05/23/2016 3:24:33 PM By: Ardath Sax MD Previous Signature: 05/23/2016 3:18:39 PM Version By: Ardath Sax MD [place], Colin VMarland Kitchen ([id number]) Entered By: Ardath Sax on 05/23/2016 15:24:33 [name]  ([id number]) -------------------------------------------------------------------------------- Progress Note Details Patient Name: [name] Date of Service: 05/23/2016 2:15 PM Medical Record Number: [id number] Patient Account Number: [id number] Date of Birth/Sex: [birth year] (70  y.o. Male) Treating RN: Ashok Cordia, Debi Primary Care Physician: Oretha Milch Other Clinician: Referring Physician: Oretha Milch Treating Physician/Extender: Elayne Snare in Treatment: 20 Subjective Chief Complaint Information obtained from Patient Patient is at the clinic for treatment of an open pressure ulcer the left upper thigh and gluteal region and the right heel with bilateral swelling of the legs all of it which is going on for over a year History of Present Illness (HPI) The following HPI elements were documented for the patient's wound: Location: ulcerated area on the right heel, left gluteal region and thigh and then bilateral lower extremities Quality: Patient reports experiencing a sharp pain to affected area(s). Severity: Patient states wound are getting worse. Duration: Patient has had the wound for > 12 months prior to seeking treatment at the wound center Timing: Pain in wound is constant (hurts all the time) Context: The wound appeared gradually over time Modifying Factors: Other treatment(s) tried include: he sees his heart doctor and his primary care doctor Associated Signs and Symptoms: patient has not been able to walk for over a year now 70 year old gentleman with a known history of hypertension, diabetes, obstructive sleep apnea, COPD, diastolic CHF, coronary artery disease was admitted to the hospital with sepsis from an ulcer of the right heel and was treated there in October 2016. he also has chronic bilateral lower extremity edema and lymphedema. He had received vancomycin, Zosyn and at that stage and x-rays showed hardware in the right ankle but no evidence of  osteomyelitis. he was a former smoker. he was also treated with Augmentin orally for 10 days. He is either bedbound or wheelchair-bound and does not ambulate by himself. 01/19/2016 -- he has not been seen here for 3 weeks and this was because he was admitted to [facility] between 223 and 01/08/2016 for sepsis, UTI and pneumonia involving the left lung. he was treated with IV vancomycin and Zosyn and then meropenem. Was discharged home on oral Bactrim for 2 weeks none of his vascular test or x-rays were done and we will reorder these. 01/26/2016 -- he has not yet done the x-ray of his foot and is vascular tests are still pending. I have asked him to work on these with his nursing home staff. Addendum: he has got an x-ray of the right foot done which shows that his osteopenia but no specific ostial lysis or abnormal periosteal reaction. Final impression was degenerative changes with dorsal foot soft tissue swelling. 02/16/2016 -- lower extremity arterial duplex examination shows a 50-99% stenosis of the right tibioperoneal trunk. He had biphasic flow in the right SFA, popliteal and tibioperoneal trunk. Left-sided he had triphasic flow throughout NIKITAS, DAVTYAN ([id number]) 02/29/2016 -- he is awaiting his vascular opinion with Dr. Gilda Crease which is to be done on April 24 03/08/2016 -- he has not kept his appointment with Dr. Gilda Crease on April 24 and does not seem to know what happened about this. it's difficult to gauge whether he is in full control of his mental faculties as at times he is extremely rude to the nursing staff. 03/22/2016 -- the patient was seen by Dr. 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 Recommended that the patient had severe atherosclerotic changes of both lower extremities associated with ulceration and tissue loss of the foot. This  is a limb threatening ischemia and the patient was recommended to undergo angiography  of the lower extremity with a hope for intervention for limb salvage. Patient agreed and will proceed to angiography. He was admitted to the hospital between May 2 and 03/15/2016 - he underwent induction of a catheter into his right lower extremity and third order catheter placement with contrast injection to the right lower extremity for distal runoff. Percutaneous transluminal angioplasty of the right superficial femoral and popliteal arteries were done. He also had a right peroneal angioplasty. Patient had a postoperative hematoma and was admitted for observation and in the next 24 hours he was very agitated and combative and had to be seen by psychiatric. He had a follow-up with Dr. Gilda Crease in 3 weeks. 04/18/2016 -- notes reviewed from the vascular office where he was seen for his postop visit after the PTA of the right SFA and popliteal arteries on 03/12/2016. He underwent an ABI which showed right ABI to be more than 1.3 and left to be more than 1 more than 1.3, great toe and PPG waveforms are decreased bilaterally. No additional intervention indicated at this time and the patient was to follow-up in 3 months with an ABI and bilateral lower extremity duplex study. 05/02/2016 -- he complains that his compression stockings are causing him a lot of discomfort and he is not happy wearing them. Objective Constitutional Vitals Time Taken: 2:11 PM, Height: 70 in, Weight: 235 lbs, BMI: 33.7, Temperature: 97.7 F, Pulse: 77 bpm, Respiratory Rate: 20 breaths/min, Blood Pressure: 111/68 mmHg. Integumentary (Hair, Skin) Wound #1 status is Open. Original cause of wound was Gradually Appeared. The wound is located on the Left Lower Leg. The wound measures 3cm length x 3cm width x 0.1cm depth; 7.069cm^2 area and 0.707cm^3 volume. The wound is limited to skin breakdown. There is no tunneling or undermining  noted. There is a medium amount of serosanguineous drainage noted. The wound margin is flat and intact. There is large (67-100%) pink granulation within the wound bed. There is a small (1-33%) amount of necrotic tissue within the wound bed including Eschar. The periwound skin appearance exhibited: Localized Edema, Moist, Erythema. The surrounding wound skin color is noted with erythema which is circumferential. Periwound temperature was noted as No Abnormality. The periwound has tenderness on palpation. Wound #2 status is Open. Original cause of wound was Gradually Appeared. The wound is located on the Right Lower Leg. The wound measures 4.5cm length x 5cm width x 0.1cm depth; 17.671cm^2 area and Knutzen, Alekzander V. ([id number]) 1.767cm^3 volume. The wound is limited to skin breakdown. There is no tunneling or undermining noted. There is a medium amount of serosanguineous drainage noted. The wound margin is flat and intact. There is large (67-100%) red granulation within the wound bed. There is a small (1-33%) amount of necrotic tissue within the wound bed including Eschar. The periwound skin appearance exhibited: Localized Edema, Moist, Erythema. The periwound skin appearance did not exhibit: Maceration. The surrounding wound skin color is noted with erythema which is circumferential. Periwound temperature was noted as No Abnormality. The periwound has tenderness on palpation. Wound #3 status is Open. Original cause of wound was Pressure Injury. The wound is located on the Right Calcaneus. The wound measures 0.6cm length x 1.5cm width x 0.2cm depth; 0.707cm^2 area and 0.141cm^3 volume. The wound is limited to skin breakdown. There is no tunneling or undermining noted. There is a large amount of serous drainage noted. The wound margin is flat and intact. There is medium (34-66%) red, pink granulation within the wound bed. There is a  medium (34-66%) amount of necrotic tissue within the wound bed  including Adherent Slough. The periwound skin appearance exhibited: Localized Edema, Maceration, Moist, Erythema. The surrounding wound skin color is noted with erythema which is circumferential. Periwound temperature was noted as No Abnormality. The periwound has tenderness on palpation. Wound #6 status is Open. Original cause of wound was Pressure Injury. The wound is located on the Right,Lateral Foot. The wound measures 0.2cm length x 0.2cm width x 0.1cm depth; 0.031cm^2 area and 0.003cm^3 volume. The wound is limited to skin breakdown. There is no tunneling or undermining noted. There is a medium amount of serosanguineous drainage noted. The wound margin is flat and intact. There is no granulation within the wound bed. There is a large (67-100%) amount of necrotic tissue within the wound bed including Eschar and Adherent Slough. The periwound skin appearance exhibited: Moist. The periwound skin appearance did not exhibit: Callus, Crepitus, Excoriation, Fluctuance, Friable, Induration, Localized Edema, Rash, Scarring, Dry/Scaly, Maceration, Atrophie Blanche, Cyanosis, Ecchymosis, Hemosiderin Staining, Mottled, Pallor, Rubor, Erythema. Periwound temperature was noted as No Abnormality. The periwound has tenderness on palpation. 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 ([id number]) Debrided right venous leg ulcer and dressed it with silver alginate. Pressure ulcer heal 1.5 x1 cm debrided.and dressed with alginate Procedures Wound #3 Wound #3 is a Pressure Ulcer located on the Right Calcaneus .  There was a Skin/Subcutaneous Tissue Debridement ([id number]) debridement with total area of 0.9 sq cm performed by Ardath Sax, MD. with the following instrument(s): Scissors to remove Viable and Non-Viable tissue/material including Exudate, Fibrin/Slough, and Subcutaneous after achieving pain control using Other (lidocaine 4% cream). A time out was conducted prior to the start of the procedure. A Minimum amount of bleeding was controlled with Pressure. The procedure was tolerated well with a pain level of 0 throughout and a pain level of 0 following the procedure. Post Debridement Measurements: 0.6cm length x 1.5cm width x 0.2cm depth; 0.141cm^3 volume. Post debridement Stage noted as Category/Stage III. Post procedure Diagnosis Wound #3: Same as Pre-Procedure Plan Wound Cleansing: Wound #1 Left Lower Leg: May Shower, gently pat wound dry prior to applying new dressing. Wound #2 Right Lower Leg: May Shower, gently pat wound dry prior to applying new dressing. Wound #3 Right Calcaneus: May Shower, gently pat wound dry prior to applying new dressing. Wound #6 Right,Lateral Foot: May Shower, gently pat wound dry prior to applying new dressing. Anesthetic: Wound #1 Left Lower Leg: Topical Lidocaine 4% cream applied to wound bed prior to debridement - for clinic use only Topical Lidocaine 4% cream applied to wound bed prior to debridement - for clinic use only Wound #3 Right Calcaneus: Topical Lidocaine 4% cream applied to wound bed prior to debridement - for clinic use only Wound #6 Right,Lateral Foot: Topical Lidocaine 4% cream applied to wound bed prior to debridement - for clinic use only Topical Lidocaine 4% cream applied to wound bed prior to debridement - for clinic use only Skin Barriers/Peri-Wound Care: Wound #1 Left Lower Leg: Moisturizing lotion - to legs Wound #2 Right Lower Leg: Moisturizing lotion - to legs Wound #3 Right Calcaneus: Skin  Prep Barrier cream - around  the heel wound to reddened and irritated areas Chamberlin, Graeden V. ([id number]) Wound #6 Right,Lateral Foot: Skin Prep Primary Wound Dressing: Wound #1 Left Lower Leg: Aquacel Ag - Please wet with saline before removing Wound #2 Right Lower Leg: Aquacel Ag - Please wet with saline before removing Wound #3 Right Calcaneus: Aquacel Ag - Please wet with saline before removing Wound #6 Right,Lateral Foot: Aquacel Ag - Please wet with saline before removing Secondary Dressing: Wound #1 Left Lower Leg: ABD pad Conform/Kerlix Wound #2 Right Lower Leg: ABD pad Conform/Kerlix Wound #3 Right Calcaneus: Boardered Foam Dressing Wound #6 Right,Lateral Foot: Boardered Foam Dressing Dressing Change Frequency: Wound #1 Left Lower Leg: Change dressing every day. - to be changed daily by SNF RN Wound #2 Right Lower Leg: Change dressing every day. - to be changed daily by SNF RN Wound #3 Right Calcaneus: Change dressing every day. - to be changed daily by SNF RN Wound #6 Right,Lateral Foot: Change dressing every day. - to be changed daily by SNF RN Follow-up Appointments: Wound #1 Left Lower Leg: Return Appointment in 1 week. Wound #2 Right Lower Leg: Return Appointment in 1 week. Wound #3 Right Calcaneus: Return Appointment in 1 week. Wound #6 Right,Lateral Foot: Return Appointment in 1 week. Edema Control: Wound #1 Left Lower Leg: Elevate legs to the level of the heart and pump ankles as often as possible Support Garment 20-30 mm/Hg pressure to: - Please place these on in the morning before he gets up and take off only at shower time and at bedtime. Nurse to assist. Wound #2 Right Lower Leg: Elevate legs to the level of the heart and pump ankles as often as possible Support Garment 20-30 mm/Hg pressure to: - Please place these on in the morning before he gets up and take off only at shower time and at bedtime. Nurse to assist. Wound #6 Right,Lateral Foot: Patient to wear own  Juxtalite/Juzo compression garment. - Nursing home to try and get him these. Patient JUANPABLO, CIRESI V. ([id number]) says the regular socks compression is too tight and hurts. Elevate legs to the level of the heart and pump ankles as often as possible Support Garment 20-30 mm/Hg pressure to: - Please place these on in the morning before he gets up and take off only at shower time and at bedtime. Nurse to assist. Off-Loading: Wound #1 Left Lower Leg: Turn and reposition every 2 hours Other: - sage boots at night and float heel when lying in bed Wound #2 Right Lower Leg: Turn and reposition every 2 hours Other: - sage boots at night and float heel when lying in bed Wound #3 Right Calcaneus: Turn and reposition every 2 hours Other: - sage boots at night and float heel when lying in bed Wound #6 Right,Lateral Foot: Turn and reposition every 2 hours Other: - sage boots at night and float heel when lying in bed Additional Orders / Instructions: Wound #1 Left Lower Leg: Increase protein intake. Wound #2 Right Lower Leg: Increase protein intake. Wound #3 Right Calcaneus: Increase protein intake. Wound #6 Right,Lateral Foot: Increase protein intake. Medications-please add to medication list.: Wound #1 Left Lower Leg: Other: - Vitamin C, Vitamin A, Zinc, multivitamin Wound #2 Right Lower Leg: Other: - Vitamin C, Vitamin A, Zinc, multivitamin Wound #3 Right Calcaneus: Other: - Vitamin C, Vitamin A, Zinc, multivitamin Wound #6 Right,Lateral Foot: Other: - Vitamin C, Vitamin A, Zinc, multivitamin General Notes: Please order Juxtalite Velcro compression wraps  for bilateral legs please. Follow-Up Appointments: A follow-up appointment should be scheduled. Medication Reconciliation completed and provided to Patient/Care Provider. A Patient Clinical Summary of Care was [facility] to [facility] Cartel V. ([id number]) Electronic Signature(s) Signed: 05/31/2016 11:48:24 AM By: Ardath SaxParker, Averi Kilty  MD Previous Signature: 05/23/2016 3:28:09 PM Version By: Ardath SaxParker, Dresden Ament MD Previous Signature: 05/23/2016 3:21:19 PM Version By: Ardath SaxParker, Sadira Standard MD Entered By: Ardath SaxParker, Mayetta Castleman on 05/31/2016 11:48:23 [name] OttoLINDLEY, Ralphael V. ([id number]) -------------------------------------------------------------------------------- SuperBill Details Patient Name: [name] OttoLINDLEY, Zelig V. Date of Service: 05/23/2016 Medical Record Number: [id number] Patient Account Number: [id number] Date of Birth/Sex: Apr 08, 1946 [age] y.o. 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 Facility Procedures CPT4 Code: [id number] Description: 11042 - DEB SUBQ TISSUE 20 SQ CM/< ICD-10 Description Diagnosis E11.621 Type 2 diabetes mellitus with foot ulcer Modifier: Quantity: 1 Physician Procedures CPT4 Code: [id number] Description: 99213 - WC PHYS LEVEL 3 - EST PT ICD-10 Description Diagnosis L89.613 Pressure ulcer of right heel, stage 3 Modifier: Quantity: 1 CPT4 Code: [id number] Description: 11042 - WC PHYS SUBQ TISS 20 SQ CM ICD-10 Description Diagnosis E11.621 Type 2 diabetes mellitus with foot ulcer Modifier: Quantity: 1 Electronic Signature(s) Signed: 05/23/2016 3:28:34 PM By: Ardath SaxParker, Catrinia Racicot MD Previous Signature: 05/23/2016 3:21:58 PM Version By: Ardath SaxParker, Traevion Poehler MD Entered By: Ardath SaxParker, Vikas Wegmann on 05/23/2016 15:28:33

## 2016-05-24 NOTE — Progress Notes (Addendum)
[name], [name] ([id number]) Visit Report for 05/23/2016 Arrival Information Details Patient Name: [name], [name] Date of Service: 05/23/2016 2:15 PM Medical Record Number: [id number] Patient Account Number: [id number] Date of Birth/Sex: 1945/11/26 (71 y.o. Male) Treating RN: Phillis Haggis Primary Care Physician: Oretha Milch Other Clinician: Referring Physician: Oretha Milch Treating Physician/Extender: Elayne Snare in Treatment: 20 Visit Information History Since Last Visit All ordered tests and consults were completed: No Patient Arrived: Wheel Chair Added or deleted any medications: No Arrival Time: 14:10 Any new allergies or adverse reactions: No Accompanied By: self Had a fall or experienced change in No Transfer Assistance: Easy[employer] activities of daily living that may affect Patient Lift risk of falls: Patient Identification Verified: Yes Signs or symptoms of abuse/neglect since last No Secondary Verification Process Yes visito Completed: Hospitalized since last visit: No Patient Requires Transmission- No Pain Present Now: No Based Precautions: Patient Has Alerts: Yes Patient Alerts: DM II Electronic Signature(s) Signed: 05/23/2016 4:15:37 PM By: Alejandro Mulling Entered By: Alejandro Mulling on 05/23/2016 14:10:39 [name], [name] ([id number]) -------------------------------------------------------------------------------- Complex / Palliative Patient Assessment Details Patient Name: [name] Date of Service: 05/23/2016 2:15 PM Medical Record Number: [id number] Patient Account Number: [id number] Date of Birth/Sex: 20-Mar-1946 (70 y.o. Male) Treating RN: Huel Coventry Primary Care Physician: Oretha Milch Other Clinician: Referring Physician: Oretha Milch Treating Physician/Extender: Ardath Sax Weeks in Treatment: 20 Palliative Management Criteria Complex Wound Management Criteria Patient has remarkable or complex co-morbidities requiring  medications or treatments that extend wound healing times. Examples: o Diabetes mellitus with chronic renal failure or end stage renal disease requiring dialysis o Advanced or poorly controlled rheumatoid arthritis o Diabetes mellitus and end stage chronic obstructive pulmonary disease o Active cancer with current chemo- or radiation therapy Care Approach Wound Care Plan: Complex Wound Management Electronic Signature(s) Signed: 05/24/2016 4:56:28 PM By: Elliot Gurney RN, [name], Kim RN, [name] Signed: 05/24/2016 4:57:40 PM By: Ardath Sax MD Entered By: Elliot Gurney, RN, [name], Kim on 05/24/2016 16:43:57 [name] ([id number]) -------------------------------------------------------------------------------- Encounter Discharge Information Details Patient Name: [name] Date of Service: 05/23/2016 2:15 PM Medical Record Number: [id number] Patient Account Number: [id number] Date of Birth/Sex: 1946-04-02 (70 y.o. Male) Treating RN: Phillis Haggis Primary Care Physician: Oretha Milch Other Clinician: Referring Physician: Oretha Milch Treating Physician/Extender: Elayne Snare in Treatment: 20 Encounter Discharge Information Items Discharge Pain Level: 0 Discharge Condition: Stable Ambulatory Status: Wheelchair Discharge Destination: Nursing Home Transportation: Other Accompanied By: self Schedule Follow-up Appointment: Yes Medication Reconciliation completed and provided to Patient/Care Yes Tamie Minteer: Provided on Clinical Summary of Care: 05/23/2016 Form Type Recipient Paper Patient EL Electronic Signature(s) Signed: 05/23/2016 3:29:13 PM By: Ardath Sax MD Previous Signature: 05/23/2016 3:22:51 PM Version By: Ardath Sax MD Previous Signature: 05/23/2016 3:07:13 PM Version By: Gwenlyn Perking Entered By: Ardath Sax on 05/23/2016 15:29:13 [name] ([id number]) -------------------------------------------------------------------------------- Lower Extremity  Assessment Details Patient Name: [name] Date of Service: 05/23/2016 2:15 PM Medical Record Number: [id number] Patient Account Number: [id number] Date of Birth/Sex: 04-17-1946 (70 y.o. Male) Treating RN: Phillis Haggis Primary Care Physician: Oretha Milch Other Clinician: Referring Physician: Oretha Milch Treating Physician/Extender: Ardath Sax Weeks in Treatment: 20 Edema Assessment Assessed: [Left: No] [Right: No] E[Left: dema] [Right: :] Calf Left: Right: Point of Measurement: 34 cm From Medial Instep 36.6 cm 36.8 cm Ankle Left: Right: Point of Measurement: 12 cm From Medial Instep 24 cm 23.2 cm Vascular Assessment Pulses: Posterior Tibial Dorsalis Pedis Palpable: [Left:Yes] [Right:Yes] Extremity colors, hair growth, and conditions: Extremity Color: [  Left:Hyperpigmented] [Right:Hyperpigmented] Temperature of Extremity: [Left:Warm] [Right:Warm] Capillary Refill: [Left:< 3 seconds] [Right:< 3 seconds] Toe Nail Assessment Left: Right: Thick: Yes Yes Discolored: Yes Yes Deformed: Yes Yes Improper Length and Hygiene: Yes Yes Electronic Signature(s) Signed: 05/23/2016 4:15:37 PM By: Alejandro Mulling Entered By: Alejandro Mulling on 05/23/2016 14:18:18 [name], [name] ([id number]) -------------------------------------------------------------------------------- Multi Wound Chart Details Patient Name: [name] Date of Service: 05/23/2016 2:15 PM Medical Record Number: [id number] Patient Account Number: [id number] Date of Birth/Sex: 21-Jun-1946 (70 y.o. Male) Treating RN: Phillis Haggis Primary Care Physician: Oretha Milch Other Clinician: Referring Physician: Oretha Milch Treating Physician/Extender: Ardath Sax Weeks in Treatment: 20 Vital Signs Height(in): 70 Pulse(bpm): 77 Weight(lbs): 235 Blood Pressure 111/68 (mmHg): Body Mass Index(BMI): 34 Temperature(F): 97.7 Respiratory Rate 20 (breaths/min): Photos: [1:No Photos] [2:No Photos] [3:No  Photos] Wound Location: [1:Left Lower Leg] [2:Right Lower Leg] [3:Right Calcaneus] Wounding Event: [1:Gradually Appeared] [2:Gradually Appeared] [3:Pressure Injury] Primary Etiology: [1:Venous Leg Ulcer] [2:Venous Leg Ulcer] [3:Pressure Ulcer] Comorbid History: [1:Cataracts, Chronic Obstructive Pulmonary Disease (COPD), Sleep Disease (COPD), Sleep Disease (COPD), Sleep Apnea, Angina, Congestive Heart Failure, Congestive Heart Failure, Congestive Heart Failure, Hypertension, Type II Diabetes,  Gout, Osteoarthritis, Neuropathy Osteoarthritis, Neuropathy Osteoarthritis, Neuropathy] [2:Cataracts, Chronic Obstructive Pulmonary Apnea, Angina, Hypertension, Type II Diabetes, Gout,] [3:Cataracts, Chronic Obstructive Pulmonary Apnea, Angina,  Hypertension, Type II Diabetes, Gout,] Date Acquired: [1:12/31/2014] [2:12/31/2014] [3:10/31/2015] Weeks of Treatment: [1:20] [2:20] [3:20] Wound Status: [1:Open] [2:Open] [3:Open] Measurements L x W x D 3x3x0.1 [2:4.5x5x0.1] [3:0.6x1.5x0.2] (cm) Area (cm) : [1:7.069] [2:17.671] [3:0.707] Volume (cm) : [1:0.707] [2:1.767] [3:0.141] % Reduction in Area: [1:71.20%] [2:-7387.70%] [3:90.00%] % Reduction in Volume: 71.10% [2:-7262.50%] [3:90.00%] Classification: [1:Partial Thickness] [2:Partial Thickness] [3:Category/Stage III] HBO Classification: [1:Grade 1] [2:Grade 1] [3:Grade 1] Exudate Amount: [1:Medium] [2:Medium] [3:Large] Exudate Type: [1:Serosanguineous] [2:Serosanguineous] [3:Serous] Exudate Color: [1:red, brown] [2:red, brown] [3:amber] Wound Margin: [1:Flat and Intact] [2:Flat and Intact] [3:Flat and Intact] Granulation Amount: [1:Large (67-100%)] [2:Large (67-100%)] [3:Medium (34-66%)] Granulation Quality: [1:Pink] [2:Red] [3:Red, Pink] Necrotic Amount: [1:Small (1-33%)] [2:Small (1-33%)] [3:Medium (34-66%)] Necrotic Tissue: Eschar Eschar Adherent Slough Exposed Structures: Fascia: No Fascia: No Fascia: No Fat: No Fat: No Fat: No Tendon:  No Tendon: No Tendon: No Muscle: No Muscle: No Muscle: No Joint: No Joint: No Joint: No Bone: No Bone: No Bone: No Limited to Skin Limited to Skin Limited to Skin Breakdown Breakdown Breakdown Epithelialization: Medium (34-66%) Medium (34-66%) None Periwound Skin Texture: Edema: Yes Edema: Yes Edema: Yes Periwound Skin Moist: Yes Moist: Yes Maceration: Yes Moisture: Maceration: No Moist: Yes Periwound Skin Color: Erythema: Yes Erythema: Yes Erythema: Yes Erythema Location: Circumferential Circumferential Circumferential Temperature: No Abnormality No Abnormality No Abnormality Tenderness on Yes Yes Yes Palpation: Wound Preparation: Ulcer Cleansing: Other: Ulcer Cleansing: Other: Ulcer Cleansing: soap and water soap and water Rinsed/Irrigated with Saline Topical Anesthetic Topical Anesthetic Applied: None Applied: None Topical Anesthetic Applied: Other: lidocaine 4% Wound Number: 6 N/A N/A Photos: No Photos N/A N/A Wound Location: Right Foot - Lateral N/A N/A Wounding Event: Pressure Injury N/A N/A Primary Etiology: Pressure Ulcer N/A N/A Comorbid History: Cataracts, Chronic N/A N/A Obstructive Pulmonary Disease (COPD), Sleep Apnea, Angina, Congestive Heart Failure, Hypertension, Type II Diabetes, Gout, Osteoarthritis, Neuropathy Date Acquired: 02/16/2016 N/A N/A Weeks of Treatment: 13 N/A N/A Wound Status: Open N/A N/A Measurements L x W x D 0.2x0.2x0.1 N/A N/A (cm) Area (cm) : 0.031 N/A N/A Volume (cm) : 0.003 N/A N/A % Reduction in Area: 80.30% N/A N/A % Reduction in Volume: 81.30% N/A N/A Classification: Category/Stage II N/A N/A  HBO Classification: Grade 1 N/A N/A Ivey, Demitris V. ([id number]) Exudate Amount: Medium N/A N/A Exudate Type: Serosanguineous N/A N/A Exudate Color: red, brown N/A N/A Wound Margin: Flat and Intact N/A N/A Granulation Amount: None Present (0%) N/A N/A Granulation Quality: N/A N/A N/A Necrotic Amount: Large (67-100%)  N/A N/A Necrotic Tissue: Eschar, Adherent Slough N/A N/A Exposed Structures: Fascia: No N/A N/A Fat: No Tendon: No Muscle: No Joint: No Bone: No Limited to Skin Breakdown Epithelialization: Medium (34-66%) N/A N/A Periwound Skin Texture: Edema: No N/A N/A Excoriation: No Induration: No Callus: No Crepitus: No Fluctuance: No Friable: No Rash: No Scarring: No Periwound Skin Moist: Yes N/A N/A Moisture: Maceration: No Dry/Scaly: No Periwound Skin Color: Atrophie Blanche: No N/A N/A Cyanosis: No Ecchymosis: No Erythema: No Hemosiderin Staining: No Mottled: No Pallor: No Rubor: No Erythema Location: N/A N/A N/A Temperature: No Abnormality N/A N/A Tenderness on Yes N/A N/A Palpation: Wound Preparation: Ulcer Cleansing: N/A N/A Rinsed/Irrigated with Saline Topical Anesthetic Applied: Other: lidocaine 4% Treatment Notes VERNER, MCCRONE ([id number]) Electronic Signature(s) Signed: 05/23/2016 4:15:37 PM By: Alejandro Mulling Entered By: Alejandro Mulling on 05/23/2016 14:25:52 [name] ([id number]) -------------------------------------------------------------------------------- Multi-Disciplinary Care Plan Details Patient Name: [name] Date of Service: 05/23/2016 2:15 PM Medical Record Number: [id number] Patient Account Number: [id number] Date of Birth/Sex: 07-20-1946 (70 y.o. Male) Treating RN: Phillis Haggis Primary Care Physician: Oretha Milch Other Clinician: Referring Physician: Oretha Milch Treating Physician/Extender: Elayne Snare in Treatment: 20 Active Inactive Abuse / Safety / Falls / Self Care Management Nursing Diagnoses: Potential for falls Goals: Patient will remain injury free Date Initiated: 03/01/2016 Goal Status: Active Interventions: Assess fall risk on admission and as needed Notes: Nutrition Nursing Diagnoses: Imbalanced nutrition Goals: Patient/caregiver agrees to and verbalizes understanding of need to use  nutritional supplements and/or vitamins as prescribed Date Initiated: 03/01/2016 Goal Status: Active Interventions: Assess patient nutrition upon admission and as needed per policy Notes: Orientation to the Wound Care Program Nursing Diagnoses: Knowledge deficit related to the wound healing center program Goals: Patient/caregiver will verbalize understanding of the [facility] [address] [name], [name] ([id number]) Date Initiated: 03/01/2016 Goal Status: Active Interventions: Provide education on orientation to the wound center Notes: Pain, Acute or Chronic Nursing Diagnoses: Pain, acute or chronic: actual or potential Potential alteration in comfort, pain Goals: Patient will verbalize adequate pain control and receive pain control interventions during procedures as needed Date Initiated: 03/01/2016 Goal Status: Active Patient/caregiver will verbalize adequate pain control between visits Date Initiated: 03/01/2016 Goal Status: Active Interventions: Assess comfort goal upon admission Complete pain assessment as per visit requirements Notes: Pressure Nursing Diagnoses: Knowledge deficit related to causes and risk factors for pressure ulcer development Knowledge deficit related to management of pressures ulcers Goals: Patient/caregiver will verbalize risk factors for pressure ulcer development Date Initiated: 03/01/2016 Goal Status: Active Interventions: Assess offloading mechanisms upon admission and as needed Notes: Wound/Skin Impairment Nursing Diagnoses: ATA, [name] ([id number]) Impaired tissue integrity Goals: Ulcer/skin breakdown will have a volume reduction of 30% by week 4 Date Initiated: 03/01/2016 Goal Status: Active Ulcer/skin breakdown will have a volume reduction of 50% by week 8 Date Initiated: 03/01/2016 Goal Status: Active Ulcer/skin breakdown will have a volume reduction of 80% by week 12 Date Initiated: 03/01/2016 Goal Status:  Active Interventions: Assess ulceration(s) every visit Notes: Electronic Signature(s) Signed: 05/23/2016 4:15:37 PM By: Alejandro Mulling Entered By: Alejandro Mulling on 05/23/2016 14:25:43 Pixler, Wisdom V. ([id number]) -------------------------------------------------------------------------------- Pain Assessment Details Patient Name: [name] Date of  Service: 05/23/2016 2:15 PM Medical Record Number: [id number] Patient Account Number: [id number] Date of Birth/Sex: [birth year] (70 y.o. Male) Treating RN: Phillis Haggis Primary Care Physician: Oretha Milch Other Clinician: Referring Physician: Oretha Milch Treating Physician/Extender: Elayne Snare in Treatment: 20 Active Problems Location of Pain Severity and Description of Pain Patient Has Paino No Site Locations With Dressing Change: No Pain Management and Medication Current Pain Management: Notes Topical or injectable lidocaine is offered to patient for acute pain when surgical debridement is performed. If needed, Patient is instructed to use over the counter pain medication for the following 24-48 hours after debridement. Wound care MDs do not prescribed pain medications. Electronic Signature(s) Signed: 05/23/2016 4:15:37 PM By: Alejandro Mulling Entered By: Alejandro Mulling on 05/23/2016 14:11:03 [name] ([id number]) -------------------------------------------------------------------------------- Patient/Caregiver Education Details Patient Name: [name] Date of Service: 05/23/2016 2:15 PM Medical Record Number: [id number] Patient Account Number: [id number] Date of Birth/Gender: 1946-05-27 (70 y.o. Male) Treating RN: Phillis Haggis Primary Care Physician: Oretha Milch Other Clinician: Referring Physician: Oretha Milch Treating Physician/Extender: Elayne Snare in Treatment: 20 Education Assessment Education Provided To: Patient Education Topics Provided Wound/Skin  Impairment: Handouts: Other: change dressings as directed Methods: Demonstration, Explain/Verbal Responses: State content correctly Electronic Signature(s) Signed: 05/23/2016 4:50:06 PM By: Ardath Sax MD Entered By: Ardath Sax on 05/23/2016 15:29:21 [name] ([id number]) -------------------------------------------------------------------------------- Wound Assessment Details Patient Name: [name] Date of Service: 05/23/2016 2:15 PM Medical Record Number: [id number] Patient Account Number: [id number] Date of Birth/Sex: 1946/08/01 (70 y.o. Male) Treating RN: Phillis Haggis Primary Care Physician: Oretha Milch Other Clinician: Referring Physician: Oretha Milch Treating Physician/Extender: Ardath Sax Weeks in Treatment: 20 Wound Status Wound Number: 1 Primary Venous Leg Ulcer Etiology: Wound Location: Left Lower Leg Wound Open Wounding Event: Gradually Appeared Status: Date Acquired: 12/31/2014 Comorbid Cataracts, Chronic Obstructive Weeks Of Treatment: 20 History: Pulmonary Disease (COPD), Sleep Clustered Wound: No Apnea, Angina, Congestive Heart Failure, Hypertension, Type II Diabetes, Gout, Osteoarthritis, Neuropathy Photos Photo Uploaded By: Alejandro Mulling on 05/23/2016 16:08:28 Wound Measurements Length: (cm) 3 Width: (cm) 3 Depth: (cm) 0.1 Area: (cm) 7.069 Volume: (cm) 0.707 % Reduction in Area: 71.2% % Reduction in Volume: 71.1% Epithelialization: Medium (34-66%) Tunneling: No Undermining: No Wound Description Classification: Partial Thickness Foul Odor A Diabetic Severity (Wagner): Grade 1 Wound Margin: Flat and Intact Exudate Amount: Medium Exudate Type: Serosanguineous Exudate Color: red, brown fter Cleansing: No Wound Bed Granulation Amount: Large (67-100%) Exposed Structure Whittingham, Keyan V. ([id number]) Granulation Quality: Pink Fascia Exposed: No Necrotic Amount: Small (1-33%) Fat Layer Exposed: No Necrotic Quality:  Eschar Tendon Exposed: No Muscle Exposed: No Joint Exposed: No Bone Exposed: No Limited to Skin Breakdown Periwound Skin Texture Texture Color No Abnormalities Noted: No No Abnormalities Noted: No Localized Edema: Yes Erythema: Yes Erythema Location: Circumferential Moisture No Abnormalities Noted: No Temperature / Pain Moist: Yes Temperature: No Abnormality Tenderness on Palpation: Yes Wound Preparation Ulcer Cleansing: Other: soap and water, Topical Anesthetic Applied: None Treatment Notes Wound #1 (Left Lower Leg) 1. Cleansed with: Cleanse wound with antibacterial soap and water 4. Dressing Applied: Aquacel Ag 5. Secondary Dressing Applied ABD Pad Dry Gauze Kerlix/Conform 7. Secured with Secretary/administrator) Signed: 05/23/2016 4:15:37 PM By: Alejandro Mulling Entered By: Alejandro Mulling on 05/23/2016 14:21:58 [name] ([id number]) -------------------------------------------------------------------------------- Wound Assessment Details Patient Name: [name] Date of Service: 05/23/2016 2:15 PM Medical Record Number: [id number] Patient Account Number: [id number] Date of Birth/Sex: Apr 05, 1946 (70 y.o. Male) Treating RN: Phillis Haggis Primary Care Physician:  Oretha Milch Other Clinician: Referring Physician: Oretha Milch Treating Physician/Extender: Ardath Sax Weeks in Treatment: 20 Wound Status Wound Number: 2 Primary Venous Leg Ulcer Etiology: Wound Location: Right Lower Leg Wound Open Wounding Event: Gradually Appeared Status: Date Acquired: 12/31/2014 Comorbid Cataracts, Chronic Obstructive Weeks Of Treatment: 20 History: Pulmonary Disease (COPD), Sleep Clustered Wound: No Apnea, Angina, Congestive Heart Failure, Hypertension, Type II Diabetes, Gout, Osteoarthritis, Neuropathy Photos Photo Uploaded By: Alejandro Mulling on 05/23/2016 16:08:29 Wound Measurements Length: (cm) 4.5 Width: (cm) 5 Depth: (cm) 0.1 Area: (cm)  17.671 Volume: (cm) 1.767 % Reduction in Area: -7387.7% % Reduction in Volume: -7262.5% Epithelialization: Medium (34-66%) Tunneling: No Undermining: No Wound Description Classification: Partial Thickness Foul Odor A Diabetic Severity (Wagner): Grade 1 Wound Margin: Flat and Intact Exudate Amount: Medium Exudate Type: Serosanguineous Exudate Color: red, brown fter Cleansing: No Wound Bed Granulation Amount: Large (67-100%) Exposed Structure Marbach, Orpheus V. ([id number]) Granulation Quality: Red Fascia Exposed: No Necrotic Amount: Small (1-33%) Fat Layer Exposed: No Necrotic Quality: Eschar Tendon Exposed: No Muscle Exposed: No Joint Exposed: No Bone Exposed: No Limited to Skin Breakdown Periwound Skin Texture Texture Color No Abnormalities Noted: No No Abnormalities Noted: No Localized Edema: Yes Erythema: Yes Erythema Location: Circumferential Moisture No Abnormalities Noted: No Temperature / Pain Maceration: No Temperature: No Abnormality Moist: Yes Tenderness on Palpation: Yes Wound Preparation Ulcer Cleansing: Other: soap and water, Topical Anesthetic Applied: None Treatment Notes Wound #2 (Right Lower Leg) 1. Cleansed with: Cleanse wound with antibacterial soap and water 4. Dressing Applied: Aquacel Ag 5. Secondary Dressing Applied ABD Pad Dry Gauze Kerlix/Conform 7. Secured with Secretary/administrator) Signed: 05/23/2016 4:15:37 PM By: Alejandro Mulling Entered By: Alejandro Mulling on 05/23/2016 14:22:56 [name] ([id number]) -------------------------------------------------------------------------------- Wound Assessment Details Patient Name: [name] Date of Service: 05/23/2016 2:15 PM Medical Record Number: [id number] Patient Account Number: [id number] Date of Birth/Sex: [birth year] (70 y.o. Male) Treating RN: Phillis Haggis Primary Care Physician: Oretha Milch Other Clinician: Referring Physician: Oretha Milch Treating  Physician/Extender: Ardath Sax Weeks in Treatment: 20 Wound Status Wound Number: 3 Primary Pressure Ulcer Etiology: Wound Location: Right Calcaneus Wound Open Wounding Event: Pressure Injury Status: Date Acquired: 10/31/2015 Comorbid Cataracts, Chronic Obstructive Weeks Of Treatment: 20 History: Pulmonary Disease (COPD), Sleep Clustered Wound: No Apnea, Angina, Congestive Heart Failure, Hypertension, Type II Diabetes, Gout, Osteoarthritis, Neuropathy Photos Photo Uploaded By: Alejandro Mulling on 05/23/2016 16:09:14 Wound Measurements Length: (cm) 0.6 Width: (cm) 1.5 Depth: (cm) 0.2 Area: (cm) 0.707 Volume: (cm) 0.141 % Reduction in Area: 90% % Reduction in Volume: 90% Epithelialization: None Tunneling: No Undermining: No Wound Description Classification: Category/Stage III Foul Odor A Diabetic Severity (Wagner): Grade 1 Wound Margin: Flat and Intact Exudate Amount: Large Exudate Type: Serous Exudate Color: amber fter Cleansing: No Wound Bed Granulation Amount: Medium (34-66%) Exposed Structure Stoffel, Anjel V. ([id number]) Granulation Quality: Red, Pink Fascia Exposed: No Necrotic Amount: Medium (34-66%) Fat Layer Exposed: No Necrotic Quality: Adherent Slough Tendon Exposed: No Muscle Exposed: No Joint Exposed: No Bone Exposed: No Limited to Skin Breakdown Periwound Skin Texture Texture Color No Abnormalities Noted: No No Abnormalities Noted: No Localized Edema: Yes Erythema: Yes Erythema Location: Circumferential Moisture No Abnormalities Noted: No Temperature / Pain Maceration: Yes Temperature: No Abnormality Moist: Yes Tenderness on Palpation: Yes Wound Preparation Ulcer Cleansing: Rinsed/Irrigated with Saline Topical Anesthetic Applied: Other: lidocaine 4%, Treatment Notes Wound #3 (Right Calcaneus) 1. Cleansed with: Cleanse wound with antibacterial soap and water 2. Anesthetic Topical Lidocaine 4% cream to wound bed prior to  debridement 4. Dressing Applied: Aquacel Ag 5. Secondary Dressing Applied Bordered Foam Dressing Electronic Signature(s) Signed: 05/23/2016 4:15:37 PM By: Alejandro MullingPinkerton, Debra Entered By: Alejandro MullingPinkerton, Debra on 05/23/2016 14:24:15 [name] OttoLINDLEY, Nikitas V. ([id number]) -------------------------------------------------------------------------------- Wound Assessment Details Patient Name: [name] OttoLINDLEY, Issiac V. Date of Service: 05/23/2016 2:15 PM Medical Record Number: [id number] Patient Account Number: [id number] Date of Birth/Sex: 13-Jul-1946 [age] y.o. Male) Treating RN: Phillis HaggisPinkerton, Debi Primary Care Physician: Oretha MilchSMITH, [name] Other Clinician: Referring Physician: Oretha MilchSMITH, [name] Treating Physician/Extender: Ardath SaxPARKER, PETER Weeks in Treatment: 20 Wound Status Wound Number: 6 Primary Pressure Ulcer Etiology: Wound Location: Right Foot - Lateral Wound Open Wounding Event: Pressure Injury Status: Date Acquired: 02/16/2016 Comorbid Cataracts, Chronic Obstructive Weeks Of Treatment: 13 History: Pulmonary Disease (COPD), Sleep Clustered Wound: No Apnea, Angina, Congestive Heart Failure, Hypertension, Type II Diabetes, Gout, Osteoarthritis, Neuropathy Photos Photo Uploaded By: Alejandro MullingPinkerton, Debra on 05/23/2016 16:09:15 Wound Measurements Length: (cm) 0.2 Width: (cm) 0.2 Depth: (cm) 0.1 Area: (cm) 0.031 Volume: (cm) 0.003 % Reduction in Area: 80.3% % Reduction in Volume: 81.3% Epithelialization: Medium (34-66%) Tunneling: No Undermining: No Wound Description Classification: Category/Stage II Diabetic Severity [address](Wagner): Grade 1 Wound Margin: Flat and Intact Exudate Amount: Medium Exudate Type: Serosanguineous Exudate Color: red, brown Foul Odor After Cleansing: No Wound Bed Granulation Amount: None Present (0%) Exposed Structure Netzer, Jesten V. ([id number]) Necrotic Amount: Large (67-100%) Fascia Exposed: No Necrotic Quality: Eschar, Adherent Slough Fat Layer Exposed: No Tendon Exposed: No Muscle  Exposed: No Joint Exposed: No Bone Exposed: No Limited to Skin Breakdown Periwound Skin Texture Texture Color No Abnormalities Noted: No No Abnormalities Noted: No Callus: No Atrophie Blanche: No Crepitus: No Cyanosis: No Excoriation: No Ecchymosis: No Fluctuance: No Erythema: No Friable: No Hemosiderin Staining: No Induration: No Mottled: No Localized Edema: No Pallor: No Rash: No Rubor: No Scarring: No Temperature / Pain Moisture Temperature: No Abnormality No Abnormalities Noted: No Tenderness on Palpation: Yes Dry / Scaly: No Maceration: No Moist: Yes Wound Preparation Ulcer Cleansing: Rinsed/Irrigated with Saline Topical Anesthetic Applied: Other: lidocaine 4%, Treatment Notes Wound #6 (Right, Lateral Foot) 1. Cleansed with: Cleanse wound with antibacterial soap and water 2. Anesthetic Topical Lidocaine 4% cream to wound bed prior to debridement 4. Dressing Applied: Aquacel Ag 5. Secondary Dressing Applied Bordered Foam Dressing Electronic Signature(s) Signed: 05/23/2016 4:15:37 PM By: Alejandro MullingPinkerton, Debra Entered By: Alejandro MullingPinkerton, Debra on 05/23/2016 14:24:56 [name] OttoLINDLEY, Cass V. ([id number]) -------------------------------------------------------------------------------- Vitals Details Patient Name: [name] OttoLINDLEY, Tatum V. Date of Service: 05/23/2016 2:15 PM Medical Record Number: [id number] Patient Account Number: [id number] Date of Birth/Sex: 13-Jul-1946 [age] y.o. Male) Treating RN: Phillis HaggisPinkerton, Debi Primary Care Physician: Oretha MilchSMITH, [name] Other Clinician: Referring Physician: Oretha MilchSMITH, [name] Treating Physician/Extender: Ardath SaxPARKER, PETER Weeks in Treatment: 20 Vital Signs Time Taken: 14:11 Temperature (F): 97.7 Height (in): 70 Pulse (bpm): 77 Weight (lbs): 235 Respiratory Rate (breaths/min): 20 Body Mass Index (BMI): 33.7 Blood Pressure (mmHg): 111/68 Reference Range: 80 - 120 mg / dl Electronic Signature(s) Signed: 05/23/2016 4:15:37 PM By: Alejandro MullingPinkerton, Debra Entered By:  Alejandro MullingPinkerton, Debra on 05/23/2016 14:11:23

## 2016-05-30 ENCOUNTER — Encounter: Payer: Medicare Other | Admitting: Surgery

## 2016-05-30 DIAGNOSIS — E11621 Type 2 diabetes mellitus with foot ulcer: Secondary | ICD-10-CM | POA: Diagnosis not present

## 2016-05-31 NOTE — Progress Notes (Signed)
KEYONTAE, [name] ([id number]) Visit Report for 05/30/2016 Chief Complaint Document Details Patient Name: [name], [name] Date of Service: 05/30/2016 2:15 PM Medical Record Number: [id number] Patient Account Number: [id number] Date of Birth/Sex: May 08, 1946 (70 y.o. Male) Treating RN: Phillis Haggis Primary Care Physician: Oretha Milch Other Clinician: Referring Physician: Oretha Milch Treating Physician/Extender: Rudene Re in Treatment: 21 Information Obtained from: Patient Chief Complaint Patient is at the clinic for treatment of an open pressure ulcer the left upper thigh and gluteal region and the right heel with bilateral swelling of the legs all of it which is going on for over a year Electronic Signature(s) Signed: 05/30/2016 3:24:45 PM By: Evlyn Kanner MD, FACS Entered By: Evlyn Kanner on 05/30/2016 15:24:44 [name] ([id number]) -------------------------------------------------------------------------------- Debridement Details Patient Name: [name] Date of Service: 05/30/2016 2:15 PM Medical Record Number: [id number] Patient Account Number: [id number] Date of Birth/Sex: June 14, 1946 (70 y.o. Male) Treating RN: Phillis Haggis Primary Care Physician: Oretha Milch Other Clinician: Referring Physician: Oretha Milch Treating Physician/Extender: Rudene Re in Treatment: 21 Debridement Performed for Wound #3 Right Calcaneus Assessment: Performed By: Physician Evlyn Kanner, MD Debridement: Debridement Pre-procedure No Verification/Time Out Taken: Start Time: 14:47 Pain Control: Other : lidocaine 4% cream Level: Skin/Subcutaneous Tissue Total Area Debrided (L x 1 (cm) x 2.8 (cm) = 2.8 (cm) W): Tissue and other Viable, Non-Viable, Exudate, Fibrin/Slough, Subcutaneous material debrided: Instrument: Curette Bleeding: Minimum Hemostasis Achieved: Pressure End Time: 14:51 Procedural Pain: 0 Post Procedural Pain: 0 Response to Treatment:  Procedure was tolerated well Post Debridement Measurements of Total Wound Length: (cm) 1 Stage: Category/Stage III Width: (cm) 3 Depth: (cm) 0.2 Volume: (cm) 0.471 Post Procedure Diagnosis Same as Pre-procedure Electronic Signature(s) Signed: 05/30/2016 3:24:25 PM By: Evlyn Kanner MD, FACS Signed: 05/30/2016 5:50:44 PM By: Alejandro Mulling Entered By: Evlyn Kanner on 05/30/2016 15:24:25 Stella, Shakeel Seth Bake ([id number]) -------------------------------------------------------------------------------- HPI Details Patient Name: [name] Date of Service: 05/30/2016 2:15 PM Medical Record Number: [id number] Patient Account Number: [id number] Date of Birth/Sex: [birth year] (70 y.o. Male) Treating RN: Phillis Haggis Primary Care Physician: Oretha Milch Other Clinician: Referring Physician: Oretha Milch Treating Physician/Extender: Rudene Re in Treatment: 21 History of Present Illness Location: ulcerated area on the right heel, left gluteal region and thigh and then bilateral lower extremities Quality: Patient reports experiencing a sharp pain to affected area(s). Severity: Patient states wound are getting worse. Duration: Patient has had the wound for > 12 months prior to seeking treatment at the wound center Timing: Pain in wound is constant (hurts all the time) Context: The wound appeared gradually over time Modifying Factors: Other treatment(s) tried include: he sees his heart doctor and his primary care doctor Associated Signs and Symptoms: patient has not been able to walk for over a year now HPI Description: 70 year old gentleman with a known history of hypertension, diabetes, obstructive sleep apnea, COPD, diastolic CHF, coronary artery disease was admitted to the hospital with sepsis from an ulcer of the right heel and was treated there in October 2016. he also has chronic bilateral lower extremity edema and lymphedema. He had received vancomycin, Zosyn and at that  stage and x-rays showed hardware in the right ankle but no evidence of osteomyelitis. he was a former smoker. he was also treated with Augmentin orally for 10 days. He is either bedbound or wheelchair-bound and does not ambulate by himself. 01/19/2016 -- he has not been seen here for 3 weeks and this was because he was admitted to [facility] between 223 and 01/08/2016 for sepsis, UTI and pneumonia involving the left lung. he was treated with IV vancomycin and Zosyn and then meropenem. Was discharged home on oral Bactrim for 2 weeks none of his vascular test or x-rays were done and we will reorder these. 01/26/2016 -- he has not yet done the x-ray of his foot and is vascular tests are still pending. I have asked him to work on these with his nursing home staff. Addendum: he has got an x-ray of the right foot done which shows that his osteopenia but no specific ostial lysis or abnormal periosteal reaction. Final impression was degenerative changes with dorsal foot soft tissue swelling. 02/16/2016 -- lower extremity arterial duplex examination shows a 50-99% stenosis of the right tibioperoneal trunk. He had biphasic flow in the right SFA, popliteal and tibioperoneal trunk. Left-sided he had triphasic flow throughout 02/29/2016 -- he is awaiting his vascular opinion with Dr. Gilda Crease which is to be done on April 24 03/08/2016 -- he has not kept his appointment with Dr. Gilda Crease on April 24 and does not seem to know what happened about this. it's difficult to gauge whether he is in full control of his mental faculties as at times he is extremely rude to the nursing staff. 03/22/2016 -- the patient was seen by Dr. 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 Recommended that the patient had severe atherosclerotic changes of both lower extremities associated with  ulceration and tissue loss of the foot. This is a limb threatening ischemia and the patient was recommended to undergo Spieker, Anis V. ([id number]) angiography of the lower extremity with a hope for intervention for limb salvage. Patient agreed and will proceed to angiography. He was admitted to the hospital between May 2 and 03/15/2016 - he underwent induction of a catheter into his right lower extremity and third order catheter placement with contrast injection to the right lower extremity for distal runoff. Percutaneous transluminal angioplasty of the right superficial femoral and popliteal arteries were done. He also had a right peroneal angioplasty. Patient had a postoperative hematoma and was admitted for observation and in the next 24 hours he was very agitated and combative and had to be seen by psychiatric. He had a follow-up with Dr. Gilda Crease in 3 weeks. 04/18/2016 -- notes reviewed from the vascular office where he was seen for his postop visit after the PTA of the right SFA and popliteal arteries on 03/12/2016. He underwent an ABI which showed right ABI to be more than 1.3 and left to be more than 1 more than 1.3, great toe and PPG waveforms are decreased bilaterally. No additional intervention indicated at this time and the patient was to follow-up in 3 months with an ABI and bilateral lower extremity duplex study. 05/02/2016 -- he complains that his compression stockings are causing him a lot of discomfort and he is not happy wearing them. 05/30/2016 -- the swelling of both legs has increased again and he has had blisters which have opened out into ulcerations. Electronic Signature(s) Signed: 05/30/2016 3:25:15 PM By: Evlyn Kanner MD, FACS Entered By: Evlyn Kanner on 05/30/2016 15:25:14 [name] ([id number]) -------------------------------------------------------------------------------- Physical Exam Details Patient Name: [name] Date of Service: 05/30/2016  2:15 PM Medical Record Number: [id number] Patient Account Number: [id number] Date of Birth/Sex: 21-Sep-1946 (70 y.o. Male) Treating RN: Phillis Haggis Primary Care Physician: Oretha Milch Other Clinician: Referring  Physician: Oretha Milch Treating Physician/Extender: Rudene Re in Treatment: 21 Constitutional . Pulse regular. Respirations normal and unlabored. Afebrile. . Eyes Nonicteric. Reactive to light. Ears, Nose, Mouth, and Throat Lips, teeth, and gums WNL.Marland Kitchen Moist mucosa without lesions. Neck supple and nontender. No palpable supraclavicular or cervical adenopathy. Normal sized without goiter. Respiratory WNL. No retractions.. Cardiovascular Pedal Pulses WNL. No clubbing, cyanosis or edema. Lymphatic No adneopathy. No adenopathy. No adenopathy. Musculoskeletal Adexa without tenderness or enlargement.. Digits and nails w/o clubbing, cyanosis, infection, petechiae, ischemia, or inflammatory conditions.. Integumentary (Hair, Skin) No suspicious lesions. No crepitus or fluctuance. No peri-wound warmth or erythema. No masses.Marland Kitchen Psychiatric Judgement and insight Intact.. No evidence of depression, anxiety, or agitation.. Notes though superficially his got several blisters and open wounds on the right more than left lower extremity and the lymphedema is a stage I. The right heel on the medial aspect has significant amount of eschar and subcutaneous venous debris and this was sharply removed with a #3 curet. Bleeding is control with pressure. Electronic Signature(s) Signed: 05/30/2016 3:26:17 PM By: Evlyn Kanner MD, FACS Entered By: Evlyn Kanner on 05/30/2016 15:26:17 [name] ([id number]) -------------------------------------------------------------------------------- Physician Orders Details Patient Name: [name] Date of Service: 05/30/2016 2:15 PM Medical Record Number: [id number] Patient Account Number: [id number] Date of Birth/Sex: 1946/06/22 (70  y.o. Male) Treating RN: Phillis Haggis Primary Care Physician: Oretha Milch Other Clinician: Referring Physician: Oretha Milch Treating Physician/Extender: Rudene Re in Treatment: 42 Verbal / Phone Orders: Yes Clinician: Ashok Cordia, Debi Read Back and Verified: Yes Diagnosis Coding Wound Cleansing Wound #1 Left Lower Leg o No tub bath. o Other: - Please do sink or bed baths **do not get leg wraps wet** Wound #2 Right Lower Leg o No tub bath. - Please do sink or bed baths **do not get leg wraps wet** Wound #3 Right Calcaneus o No tub bath. - Please do sink or bed baths **do not get leg wraps wet** Wound #6 Right,Lateral Foot o No tub bath. - Please do sink or bed baths **do not get leg wraps wet** Anesthetic Wound #1 Left Lower Leg o Topical Lidocaine 4% cream applied to wound bed prior to debridement - for clinic use only o Topical Lidocaine 4% cream applied to wound bed prior to debridement - for clinic use only Wound #3 Right Calcaneus o Topical Lidocaine 4% cream applied to wound bed prior to debridement - for clinic use only Wound #6 Right,Lateral Foot o Topical Lidocaine 4% cream applied to wound bed prior to debridement - for clinic use only o Topical Lidocaine 4% cream applied to wound bed prior to debridement - for clinic use only Skin Barriers/Peri-Wound Care Wound #3 Right Calcaneus o Barrier cream - around the heel wound to reddened and irritated areas Wound #6 Right,Lateral Foot o Skin Prep Lines, Dreshaun V. ([id number]) Primary Wound Dressing Wound #1 Left Lower Leg o Aquacel Ag - Please wet with saline before removing Wound #2 Right Lower Leg o Aquacel Ag - Please wet with saline before removing Wound #3 Right Calcaneus o Prisma Ag - moisten with normal saline Wound #6 Right,Lateral Foot o Aquacel Ag Secondary Dressing Wound #1 Left Lower Leg o ABD pad Wound #2 Right Lower Leg o ABD pad Wound #3 Right  Calcaneus o ABD pad o Dry Gauze o Conform/Kerlix Wound #6 Right,Lateral Foot o Dry Gauze o Foam Dressing Change Frequency Wound #1 Left Lower Leg o Change dressing every week - at the wound clinic Wound #2 Right  Lower Leg o Change dressing every week - at the wound clinic Wound #3 Right Calcaneus o Change dressing every other day. - to be changed every other day by SNF RN Wound #6 Right,Lateral Foot o Change dressing every week - at the wound clinic Follow-up Appointments Wound #1 Left Lower Leg o Return Appointment in 1 week. Wound #2 Right Lower Leg o Return Appointment in 1 week. CASIMER, RUSSETT VMarland Kitchen ([id number]) Wound #3 Right Calcaneus o Return Appointment in 1 week. Wound #6 Right,Lateral Foot o Return Appointment in 1 week. Edema Control Wound #1 Left Lower Leg o 3 Layer Compression System - Bilateral o Elevate legs to the level of the heart and pump ankles as often as possible Wound #2 Right Lower Leg o 3 Layer Compression System - Bilateral o Elevate legs to the level of the heart and pump ankles as often as possible Wound #6 Right,Lateral Foot o Elevate legs to the level of the heart and pump ankles as often as possible Off-Loading Wound #1 Left Lower Leg o Turn and reposition every 2 hours o Other: - sage boots at night and float heel when lying in bed Wound #2 Right Lower Leg o Turn and reposition every 2 hours o Other: - sage boots at night and float heel when lying in bed Wound #3 Right Calcaneus o Turn and reposition every 2 hours o Other: - sage boots at night and float heel when lying in bed Wound #6 Right,Lateral Foot o Turn and reposition every 2 hours o Other: - sage boots at night and float heel when lying in bed Additional Orders / Instructions Wound #1 Left Lower Leg o Increase protein intake. Wound #2 Right Lower Leg o Increase protein intake. Wound #3 Right Calcaneus Belloso, Olumide  V. ([id number]) o Increase protein intake. Wound #6 Right,Lateral Foot o Increase protein intake. Medications-please add to medication list. Wound #1 Left Lower Leg o Other: - Vitamin C, Vitamin A, Zinc, multivitamin Wound #2 Right Lower Leg o Other: - Vitamin C, Vitamin A, Zinc, multivitamin Wound #3 Right Calcaneus o Other: - Vitamin C, Vitamin A, Zinc, multivitamin Wound #6 Right,Lateral Foot o Other: - Vitamin C, Vitamin A, Zinc, multivitamin Electronic Signature(s) Signed: 05/30/2016 4:28:14 PM By: Evlyn Kanner MD, FACS Signed: 05/30/2016 5:50:44 PM By: Alejandro Mulling Entered By: Alejandro Mulling on 05/30/2016 16:17:16 Whack, Romani Seth Bake ([id number]) -------------------------------------------------------------------------------- Problem List Details Patient Name: [name] Date of Service: 05/30/2016 2:15 PM Medical Record Number: [id number] Patient Account Number: [id number] Date of Birth/Sex: 1946-09-17 (70 y.o. Male) Treating RN: Phillis Haggis Primary Care Physician: Oretha Milch Other Clinician: Referring Physician: Oretha Milch Treating Physician/Extender: Rudene Re in Treatment: 21 Active Problems ICD-10 Encounter Code Description Active Date Diagnosis E11.621 Type 2 diabetes mellitus with foot ulcer 01/01/2016 Yes L89.613 Pressure ulcer of right heel, stage 3 01/01/2016 Yes E66.01 Morbid (severe) obesity due to excess calories 01/01/2016 Yes I89.0 Lymphedema, not elsewhere classified 01/01/2016 Yes M70.871 Other soft tissue disorders related to use, overuse and 01/19/2016 Yes pressure, right ankle and foot 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/16/2016 Yes ulceration of heel and midfoot Inactive Problems Resolved Problems ICD-10 Code Description Active Date Resolved Date L89.322 Pressure ulcer of left buttock, stage 2 01/01/2016 01/01/2016 Electronic Signature(s) Signed: 05/30/2016 3:24:05 PM By: Evlyn Kanner MD,  FACS Entered By: Evlyn Kanner on 05/30/2016 15:24:04 [name] ([id number]) [name], Dean VMarland Kitchen ([id number]) -------------------------------------------------------------------------------- Progress Note Details Patient Name: [name] Date of Service: 05/30/2016 2:15  PM Medical Record Number: [id number] Patient Account Number: [id number] Date of Birth/Sex: 01/27/1946 (69 y.o. Male) Treating RN: Ashok Cordia, Debi Primary Care Physician: Oretha Milch Other Clinician: Referring Physician: Oretha Milch Treating Physician/Extender: Rudene Re in Treatment: 21 Subjective Chief Complaint Information obtained from Patient Patient is at the clinic for treatment of an open pressure ulcer the left upper thigh and gluteal region and the right heel with bilateral swelling of the legs all of it which is going on for over a year History of Present Illness (HPI) The following HPI elements were documented for the patient's wound: Location: ulcerated area on the right heel, left gluteal region and thigh and then bilateral lower extremities Quality: Patient reports experiencing a sharp pain to affected area(s). Severity: Patient states wound are getting worse. Duration: Patient has had the wound for > 12 months prior to seeking treatment at the wound center Timing: Pain in wound is constant (hurts all the time) Context: The wound appeared gradually over time Modifying Factors: Other treatment(s) tried include: he sees his heart doctor and his primary care doctor Associated Signs and Symptoms: patient has not been able to walk for over a year now 70 year old gentleman with a known history of hypertension, diabetes, obstructive sleep apnea, COPD, diastolic CHF, coronary artery disease was admitted to the hospital with sepsis from an ulcer of the right heel and was treated there in October 2016. he also has chronic bilateral lower extremity edema and lymphedema. He had received  vancomycin, Zosyn and at that stage and x-rays showed hardware in the right ankle but no evidence of osteomyelitis. he was a former smoker. he was also treated with Augmentin orally for 10 days. He is either bedbound or wheelchair-bound and does not ambulate by himself. 01/19/2016 -- he has not been seen here for 3 weeks and this was because he was admitted to [facility] between 223 and 01/08/2016 for sepsis, UTI and pneumonia involving the left lung. he was treated with IV vancomycin and Zosyn and then meropenem. Was discharged home on oral Bactrim for 2 weeks none of his vascular test or x-rays were done and we will reorder these. 01/26/2016 -- he has not yet done the x-ray of his foot and is vascular tests are still pending. I have asked him to work on these with his nursing home staff. Addendum: he has got an x-ray of the right foot done which shows that his osteopenia but no specific ostial lysis or abnormal periosteal reaction. Final impression was degenerative changes with dorsal foot soft tissue swelling. 02/16/2016 -- lower extremity arterial duplex examination shows a 50-99% stenosis of the right tibioperoneal trunk. He had biphasic flow in the right SFA, popliteal and tibioperoneal trunk. Left-sided he had triphasic flow throughout JAKOREY, MCCONATHY ([id number]) 02/29/2016 -- he is awaiting his vascular opinion with Dr. Gilda Crease which is to be done on April 24 03/08/2016 -- he has not kept his appointment with Dr. Gilda Crease on April 24 and does not seem to know what happened about this. it's difficult to gauge whether he is in full control of his mental faculties as at times he is extremely rude to the nursing staff. 03/22/2016 -- the patient was seen by Dr. 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 Recommended that the patient had severe  atherosclerotic changes of both lower extremities associated with ulceration and tissue loss of the foot.  This is a limb threatening ischemia and the patient was recommended to undergo angiography of the lower extremity with a hope for intervention for limb salvage. Patient agreed and will proceed to angiography. He was admitted to the hospital between May 2 and 03/15/2016 - he underwent induction of a catheter into his right lower extremity and third order catheter placement with contrast injection to the right lower extremity for distal runoff. Percutaneous transluminal angioplasty of the right superficial femoral and popliteal arteries were done. He also had a right peroneal angioplasty. Patient had a postoperative hematoma and was admitted for observation and in the next 24 hours he was very agitated and combative and had to be seen by psychiatric. He had a follow-up with Dr. Gilda Crease in 3 weeks. 04/18/2016 -- notes reviewed from the vascular office where he was seen for his postop visit after the PTA of the right SFA and popliteal arteries on 03/12/2016. He underwent an ABI which showed right ABI to be more than 1.3 and left to be more than 1 more than 1.3, great toe and PPG waveforms are decreased bilaterally. No additional intervention indicated at this time and the patient was to follow-up in 3 months with an ABI and bilateral lower extremity duplex study. 05/02/2016 -- he complains that his compression stockings are causing him a lot of discomfort and he is not happy wearing them. 05/30/2016 -- the swelling of both legs has increased again and he has had blisters which have opened out into ulcerations. Objective Constitutional Pulse regular. Respirations normal and unlabored. Afebrile. Vitals Time Taken: 2:39 PM, Height: 70 in, Weight: 235 lbs, BMI: 33.7, Temperature: 97.6 F, Pulse: 76 bpm, Respiratory Rate: 20 breaths/min, Blood Pressure: 114/66 mmHg. Eyes Nonicteric. Reactive to  light. Ears, Nose, Mouth, and Throat Lips, teeth, and gums WNL.Marland Kitchen Moist mucosa without lesions. Neck Venturino, Elia V. ([id number]) supple and nontender. No palpable supraclavicular or cervical adenopathy. Normal sized without goiter. Respiratory WNL. No retractions.. Cardiovascular Pedal Pulses WNL. No clubbing, cyanosis or edema. Lymphatic No adneopathy. No adenopathy. No adenopathy. Musculoskeletal Adexa without tenderness or enlargement.. Digits and nails w/o clubbing, cyanosis, infection, petechiae, ischemia, or inflammatory conditions.Marland Kitchen Psychiatric Judgement and insight Intact.. No evidence of depression, anxiety, or agitation.. General Notes: though superficially his got several blisters and open wounds on the right more than left lower extremity and the lymphedema is a stage I. The right heel on the medial aspect has significant amount of eschar and subcutaneous venous debris and this was sharply removed with a #3 curet. Bleeding is control with pressure. Integumentary (Hair, Skin) No suspicious lesions. No crepitus or fluctuance. No peri-wound warmth or erythema. No masses.. Wound #1 status is Open. Original cause of wound was Gradually Appeared. The wound is located on the Left Lower Leg. The wound measures 3cm length x 3cm width x 0.1cm depth; 7.069cm^2 area and 0.707cm^3 volume. The wound is limited to skin breakdown. There is no tunneling or undermining noted. There is a large amount of serous drainage noted. The wound margin is flat and intact. There is large (67- 100%) pink granulation within the wound bed. There is a small (1-33%) amount of necrotic tissue within the wound bed including Eschar. The periwound skin appearance exhibited: Localized Edema, Moist, Erythema. The surrounding wound skin color is noted with erythema which is circumferential. Periwound temperature was noted as No Abnormality. The periwound has tenderness on palpation. Wound #2 status is Open.  Original cause of wound was Gradually Appeared. The wound is located on  the Right Lower Leg. The wound measures 4.5cm length x 5cm width x 0.1cm depth; 17.671cm^2 area and 1.767cm^3 volume. The wound is limited to skin breakdown. There is no tunneling or undermining noted. There is a large amount of serosanguineous drainage noted. The wound margin is flat and intact. There is large (67-100%) red granulation within the wound bed. There is a small (1-33%) amount of necrotic tissue within the wound bed including Eschar. The periwound skin appearance exhibited: Localized Edema, Moist, Erythema. The periwound skin appearance did not exhibit: Maceration. The surrounding wound skin color is noted with erythema which is circumferential. Periwound temperature was noted as No Abnormality. The periwound has tenderness on palpation. Wound #3 status is Open. Original cause of wound was Pressure Injury. The wound is located on the Right Calcaneus. The wound measures 1cm length x 2.8cm width x 0.2cm depth; 2.199cm^2 area and 0.44cm^3 volume. The wound is limited to skin breakdown. There is no tunneling or undermining noted. There is a large amount of serous drainage noted. The wound margin is flat and intact. There is medium (34-66%) red, pink granulation within the wound bed. There is a medium (34-66%) amount of necrotic tissue within the [facility], Thorsten V. ([id number]) wound bed including Adherent Slough. The periwound skin appearance exhibited: Localized Edema, Maceration, Moist, Erythema. The surrounding wound skin color is noted with erythema which is circumferential. Periwound temperature was noted as No Abnormality. The periwound has tenderness on palpation. Wound #6 status is Open. Original cause of wound was Pressure Injury. The wound is located on the Right,Lateral Foot. The wound measures 0.1cm length x 0.1cm width x 0.1cm depth; 0.008cm^2 area and 0.001cm^3 volume. The wound is limited to skin  breakdown. There is no tunneling or undermining noted. There is a small amount of serosanguineous drainage noted. The wound margin is flat and intact. There is large (67-100%) pink granulation within the wound bed. There is no necrotic tissue within the wound bed. The periwound skin appearance exhibited: Moist. The periwound skin appearance did not exhibit: Callus, Crepitus, Excoriation, Fluctuance, Friable, Induration, Localized Edema, Rash, Scarring, Dry/Scaly, Maceration, Atrophie Blanche, Cyanosis, Ecchymosis, Hemosiderin Staining, Mottled, Pallor, Rubor, Erythema. Periwound temperature was noted as No Abnormality. The periwound has tenderness on palpation. 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 There was a Skin/Subcutaneous Tissue Debridement ([id number]) debridement with total area of 2.8 sq cm performed by Evlyn Kanner, MD. with the following instrument(s): Curette to remove Viable and Non-Viable tissue/material including Exudate, Fibrin/Slough, and Subcutaneous after achieving pain control using Other (lidocaine 4% cream). A time out was not conducted prior to the start of the procedure. A Minimum amount of bleeding was controlled with Pressure. The procedure was tolerated well with a pain level of 0 throughout and a pain level of 0 following the procedure. Post Debridement Measurements: 1cm length x 3cm width x 0.2cm depth; 0.471cm^3 volume. Post debridement Stage noted as Category/Stage III. Post procedure Diagnosis  Wound #3: Same as Pre-Procedure Massiah, Chrishon V. ([id number]) Plan Wound Cleansing: Wound #1 Left Lower Leg: No tub bath. Other: -  Please do sink or bed baths **do not get leg wraps wet** Wound #2 Right Lower Leg: No tub bath. - Please do sink or bed baths **do not get leg wraps wet** Wound #3 Right Calcaneus: No tub bath. - Please do sink or bed baths **do not get leg wraps wet** Wound #6 Right,Lateral Foot: No tub bath. - Please do sink or bed baths **do not get leg wraps wet** Anesthetic: Wound #1 Left Lower Leg: Topical Lidocaine 4% cream applied to wound bed prior to debridement - for clinic use only Topical Lidocaine 4% cream applied to wound bed prior to debridement - for clinic use only Wound #3 Right Calcaneus: Topical Lidocaine 4% cream applied to wound bed prior to debridement - for clinic use only Wound #6 Right,Lateral Foot: Topical Lidocaine 4% cream applied to wound bed prior to debridement - for clinic use only Topical Lidocaine 4% cream applied to wound bed prior to debridement - for clinic use only Skin Barriers/Peri-Wound Care: Wound #3 Right Calcaneus: Barrier cream - around the heel wound to reddened and irritated areas Wound #6 Right,Lateral Foot: Skin Prep Primary Wound Dressing: Wound #1 Left Lower Leg: Aquacel Ag - Please wet with saline before removing Wound #2 Right Lower Leg: Aquacel Ag - Please wet with saline before removing Wound #3 Right Calcaneus: Prisma Ag - moisten with normal saline Wound #6 Right,Lateral Foot: Aquacel Ag Secondary Dressing: Wound #1 Left Lower Leg: ABD pad Wound #2 Right Lower Leg: ABD pad Wound #3 Right Calcaneus: ABD pad Dry Gauze Conform/Kerlix Wound #6 Right,Lateral Foot: Dry Gauze Foam Golubski, Yadiel V. ([id number]) Dressing Change Frequency: Wound #1 Left Lower Leg: Change dressing every week - at the wound clinic Wound #2 Right Lower Leg: Change dressing every week - at the wound clinic Wound #3 Right  Calcaneus: Change dressing every other day. - to be changed every other day by SN[facility] RN Wound #6 Right,Lateral Foot: Change dressing every week - at the wound clinic Follow-up Appointments: Wound #1 Left Lower Leg: Return Appointment in 1 week. Wound #2 Right Lower Leg: Return Appointment in 1 week. Wound #3 Right Calcaneus: Return Appointment in 1 week. Wound #6 Right,Lateral Foot: Return Appointment in 1 week. Edema Control: Wound #1 Left Lower Leg: 3 Layer Compression System - Bilateral Elevate legs to the level of the heart and pump ankles as often as possible Wound #2 Right Lower Leg: 3 Layer Compression System - Bilateral Elevate legs to the level of the heart and pump ankles as often as possible Wound #6 Right,Lateral Foot: Elevate legs to the level of the heart and pump ankles as often as possible Off-Loading: Wound #1 Left Lower Leg: Turn and reposition every 2 hours Other: - sage boots at night and float heel when lying in bed Wound #2 Right Lower Leg: Turn and reposition every 2 hours Other: - sage boots at night and float heel when lying in bed Wound #3 Right Calcaneus: Turn and reposition every 2 hours Other: - sage boots at night and float heel when lying in bed Wound #6 Right,Lateral Foot: Turn and reposition every 2 hours Other: - sage boots at night and float heel when lying in bed Additional Orders / Instructions: Wound #1 Left Lower Leg: Increase protein intake. Wound #2 Right Lower Leg: Increase protein intake. Wound #3 Right Calcaneus: Increase protein intake. Wound #6 Right,Lateral Foot: Increase protein intake. Medications-please add to medication list.: Wound #1 Left Lower Leg: Masso, Lindbergh V. ([id number]) Other: - Vitamin C,  Vitamin A, Zinc, multivitamin Wound #2 Right Lower Leg: Other: - Vitamin C, Vitamin A, Zinc, multivitamin Wound #3 Right Calcaneus: Other: - Vitamin C, Vitamin A, Zinc, multivitamin Wound #6 Right,Lateral Foot: Other: -  Vitamin C, Vitamin A, Zinc, multivitamin I have recommended: 1. Prisma AG to his right heel. The patient also has a wound on the right lateral foot. This will be treated with silver alginate 2. Constant off loading and also using Sage boot. 3. he is not doing well with his bilateral lower extremity compression stockings of the 20-30 mm variety. we will use Silver alginate and 3 layer Profore compressions for both lower extremities. 4. High-protein diet and multivitamins including vitamin C and zinc. Electronic Signature(s) Signed: 05/30/2016 5:05:39 PM By: Evlyn KannerBritto, Shanikia Kernodle MD, FACS Previous Signature: 05/30/2016 5:05:05 PM Version By: Evlyn KannerBritto, Draycen Leichter MD, FACS Previous Signature: 05/30/2016 3:27:46 PM Version By: Evlyn KannerBritto, Anchor Dwan MD, FACS Entered By: Evlyn KannerBritto, Tameka Hoiland on 05/30/2016 17:05:39 [name] OttoLINDLEY, Venkat V. ([id number]) -------------------------------------------------------------------------------- SuperBill Details Patient Name: [name] OttoLINDLEY, Laderius V. Date of Service: 05/30/2016 Medical Record Number: [id number] Patient Account Number: [id number] Date of Birth/Sex: 07-25-1946 [age] y.o. 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 Facility Procedures CPT4 Code: [id number] Description: 11042 - DEB SUBQ TISSUE 20 SQ CM/< ICD-10 Description Diagnosis E11.621 Type 2 diabetes mellitus with foot ulcer L89.613 Pressure ulcer of right heel, stage 3 I89.0  Lymphedema, not elsewhere classified Modifier: Quantity: 1 Physician Procedures CPT4 Code: [id number] Description: 99213 - WC PHYS LEVEL 3 - EST PT ICD-10 Description Diagnosis E11.621 Type 2 diabetes mellitus with foot ulcer L89.613 Pressure ulcer of right heel, stage 3 I89.0 Lymphedema, not elsewhere classified Modifier: 25 Quantity: 1 CPT4 Code: [id number] Rockhold, Tywan V Description: 11042 - WC PHYS SUBQ TISS 20 SQ CM ICD-10 Description Diagnosis E11.621 Type 2 diabetes mellitus with foot ulcer L89.613 Pressure ulcer of right heel, stage 3 I89.0 Lymphedema, not elsewhere classified . ([id number]) Modifier: Quantity: 1 Electronic Signature(s) Signed: 05/30/2016 3:28:11 PM By: Evlyn KannerBritto, Karem Farha MD, FACS Entered By: Evlyn KannerBritto, Marinell Igarashi on 05/30/2016 15:28:11

## 2016-05-31 NOTE — Progress Notes (Signed)
[name], Sollie V. ([id number]) Visit Report for 05/30/2016 Arrival Information Details Patient Name: [name], [name] Date of Service: 05/30/2016 2:15 PM Medical Record Number: [id number] Patient Account Number: [id number] Date of Birth/Sex: May 30, 1946 [age] y.o. Male) Treating RN: Phillis HaggisPinkerton, Debi Primary Care Physician: Oretha MilchSMITH, [name] Other Clinician: Referring Physician: Oretha MilchSMITH, [name] Treating Physician/Extender: Rudene ReBritto, Errol Weeks in Treatment: 21 Visit Information History Since Last Visit All ordered tests and consults were completed: No Patient Arrived: Wheel Chair Added or deleted any medications: No Arrival Time: 14:22 Any new allergies or adverse reactions: No Accompanied By: self Had a fall or experienced change in No Transfer Assistance: EasyP[employer] activities of daily living that may affect Patient Lift risk of falls: Patient Identification Verified: Yes Signs or symptoms of abuse/neglect since last No Secondary Verification Process Yes visito Completed: Hospitalized since last visit: No Patient Requires Transmission- No Pain Present Now: No Based Precautions: Patient Has Alerts: Yes Patient Alerts: DM II Electronic Signature(s) Signed: 05/30/2016 5:50:44 PM By: Alejandro MullingPinkerton, Debra Entered By: Alejandro MullingPinkerton, Debra on 05/30/2016 14:25:18 [name], [name] ([id number]) -------------------------------------------------------------------------------- Encounter Discharge Information Details Patient Name: [name], [name] Date of Service: 05/30/2016 2:15 PM Medical Record Number: [id number] Patient Account Number: [id number] Date of Birth/Sex: May 30, 1946 [age] y.o. Male) Treating RN: Phillis HaggisPinkerton, Debi Primary Care Physician: Oretha MilchSMITH, [name] Other Clinician: Referring Physician: Oretha MilchSMITH, [name] Treating Physician/Extender: Rudene ReBritto, Errol Weeks in Treatment: 921 Encounter Discharge Information Items Discharge Pain Level: 0 Discharge Condition: Stable Ambulatory Status: Wheelchair Discharge  Destination: Nursing Home Transportation: Other Accompanied By: self Schedule Follow-up Appointment: Yes Medication Reconciliation completed and provided to Patient/Care Yes [place]: Provided on Clinical Summary of Care: 05/30/2016 Form Type Recipient Paper Patient EL Electronic Signature(s) Signed: 05/30/2016 5:50:44 PM By: Alejandro MullingPinkerton, Debra Previous Signature: 05/30/2016 3:28:27 PM Version By: Gwenlyn PerkingMoore, Shelia Entered By: Alejandro MullingPinkerton, Debra on 05/30/2016 16:16:10 [name], [name] ([id number]) -------------------------------------------------------------------------------- Lower Extremity Assessment Details Patient Name: [name], [name] Date of Service: 05/30/2016 2:15 PM Medical Record Number: [id number] Patient Account Number: [id number] Date of Birth/Sex: May 30, 1946 6(70 y.o. Male) Treating RN: Phillis HaggisPinkerton, Debi Primary Care Physician: Oretha MilchSMITH, [name] Other Clinician: Referring Physician: Oretha MilchSMITH, [name] Treating Physician/Extender: Rudene ReBritto, Errol Weeks in Treatment: 21 Edema Assessment Assessed: [Left: No] [Right: No] E[Left: dema] [Right: :] Calf Left: Right: Point of Measurement: 34 cm From Medial Instep 36.8 cm 40 cm Ankle Left: Right: Point of Measurement: 12 cm From Medial Instep 24.2 cm 23.4 cm Vascular Assessment Pulses: Posterior Tibial Dorsalis Pedis Palpable: [Left:Yes] [Right:Yes] Extremity colors, hair growth, and conditions: Extremity Color: [Left:Hyperpigmented] [Right:Hyperpigmented] Temperature of Extremity: [Left:Warm] [Right:Warm] Capillary Refill: [Left:< 3 seconds] [Right:< 3 seconds] Toe Nail Assessment Left: Right: Thick: Yes Yes Discolored: Yes Yes Deformed: Yes Yes Improper Length and Hygiene: No Electronic Signature(s) Signed: 05/30/2016 5:50:44 PM By: Alejandro MullingPinkerton, Debra Entered By: Alejandro MullingPinkerton, Debra on 05/30/2016 14:34:01 [name], [name] ([id number]) -------------------------------------------------------------------------------- Multi Wound Chart  Details Patient Name: [name], [name] Date of Service: 05/30/2016 2:15 PM Medical Record Number: [id number] Patient Account Number: [id number] Date of Birth/Sex: May 30, 1946 [age] y.o. Male) Treating RN: Phillis HaggisPinkerton, Debi Primary Care Physician: Oretha MilchSMITH, [name] Other Clinician: Referring Physician: Oretha MilchSMITH, [name] Treating Physician/Extender: Rudene ReBritto, Errol Weeks in Treatment: 21 Vital Signs Height(in): 70 Pulse(bpm): 76 Weight(lbs): 235 Blood Pressure 114/66 (mmHg): Body Mass Index(BMI): 34 Temperature(F): 97.6 Respiratory Rate 20 (breaths/min): Photos: [1:No Photos] [2:No Photos] [3:No Photos] Wound Location: [1:Left Lower Leg] [2:Right Lower Leg] [3:Right Calcaneus] Wounding Event: [1:Gradually Appeared] [2:Gradually Appeared] [3:Pressure Injury] Primary Etiology: [1:Venous Leg Ulcer] [2:Venous Leg Ulcer] [3:Pressure Ulcer] Comorbid History: [1:Cataracts, Chronic Obstructive Pulmonary Disease (  COPD), Sleep Disease (COPD), Sleep Disease (COPD), Sleep Apnea, Angina, Congestive Heart Failure, Congestive Heart Failure, Congestive Heart Failure, Hypertension, Type II Diabetes,  Gout, Osteoarthritis, Neuropathy Osteoarthritis, Neuropathy Osteoarthritis, Neuropathy] [2:Cataracts, Chronic Obstructive Pulmonary Apnea, Angina, Hypertension, Type II Diabetes, Gout,] [3:Cataracts, Chronic Obstructive Pulmonary Apnea, Angina,  Hypertension, Type II Diabetes, Gout,] Date Acquired: [1:12/31/2014] [2:12/31/2014] [3:10/31/2015] Weeks of Treatment: [1:21] [2:21] [3:21] Wound Status: [1:Open] [2:Open] [3:Open] Measurements L x W x D 3x3x0.1 [2:4.5x5x0.1] [3:1x2.8x0.2] (cm) Area (cm) : [1:7.069] [2:17.671] [3:2.199] Volume (cm) : [1:0.707] [2:1.767] [3:0.44] % Reduction in Area: [1:71.20%] [2:-7387.70%] [3:68.90%] % Reduction in Volume: 71.10% [2:-7262.50%] [3:68.90%] Classification: [1:Partial Thickness] [2:Partial Thickness] [3:Category/Stage III] HBO Classification: [1:Grade 1] [2:Grade 1] [3:Grade  1] Exudate Amount: [1:Large] [2:Large] [3:Large] Exudate Type: [1:Serous] [2:Serosanguineous] [3:Serous] Exudate Color: [1:amber] [2:red, brown] [3:amber] Wound Margin: [1:Flat and Intact] [2:Flat and Intact] [3:Flat and Intact] Granulation Amount: [1:Large (67-100%)] [2:Large (67-100%)] [3:Medium (34-66%)] Granulation Quality: [1:Pink] [2:Red] [3:Red, Pink] Necrotic Amount: [1:Small (1-33%)] [2:Small (1-33%)] [3:Medium (34-66%)] Necrotic Tissue: Eschar Eschar Adherent Slough Exposed Structures: Fascia: No Fascia: No Fascia: No Fat: No Fat: No Fat: No Tendon: No Tendon: No Tendon: No Muscle: No Muscle: No Muscle: No Joint: No Joint: No Joint: No Bone: No Bone: No Bone: No Limited to Skin Limited to Skin Limited to Skin Breakdown Breakdown Breakdown Epithelialization: Medium (34-66%) Medium (34-66%) None Periwound Skin Texture: Edema: Yes Edema: Yes Edema: Yes Periwound Skin Moist: Yes Moist: Yes Maceration: Yes Moisture: Maceration: No Moist: Yes Periwound Skin Color: Erythema: Yes Erythema: Yes Erythema: Yes Erythema Location: Circumferential Circumferential Circumferential Temperature: No Abnormality No Abnormality No Abnormality Tenderness on Yes Yes Yes Palpation: Wound Preparation: Ulcer Cleansing: Other: Ulcer Cleansing: Other: Ulcer Cleansing: soap and water soap and water Rinsed/Irrigated with Saline Topical Anesthetic Topical Anesthetic Applied: None Applied: None Topical Anesthetic Applied: Other: lidocaine 4% Wound Number: 6 N/A N/A Photos: No Photos N/A N/A Wound Location: Right Foot - Lateral N/A N/A Wounding Event: Pressure Injury N/A N/A Primary Etiology: Pressure Ulcer N/A N/A Comorbid History: Cataracts, Chronic N/A N/A Obstructive Pulmonary Disease (COPD), Sleep Apnea, Angina, Congestive Heart Failure, Hypertension, Type II Diabetes, Gout, Osteoarthritis, Neuropathy Date Acquired: 02/16/2016 N/A N/A Weeks of Treatment: 14 N/A  N/A Wound Status: Open N/A N/A Measurements L x W x D 0.1x0.1x0.1 N/A N/A (cm) Area (cm) : 0.008 N/A N/A Volume (cm) : 0.001 N/A N/A % Reduction in Area: 94.90% N/A N/A % Reduction in Volume: 93.80% N/A N/A Classification: Category/Stage II N/A N/A HBO Classification: Grade 1 N/A N/A Benavides, [name] ([id number]) Exudate Amount: Small N/A N/A Exudate Type: Serosanguineous N/A N/A Exudate Color: red, brown N/A N/A Wound Margin: Flat and Intact N/A N/A Granulation Amount: Large (67-100%) N/A N/A Granulation Quality: Pink N/A N/A Necrotic Amount: None Present (0%) N/A N/A Necrotic Tissue: N/A N/A N/A Exposed Structures: Fascia: No N/A N/A Fat: No Tendon: No Muscle: No Joint: No Bone: No Limited to Skin Breakdown Epithelialization: Medium (34-66%) N/A N/A Periwound Skin Texture: Edema: No N/A N/A Excoriation: No Induration: No Callus: No Crepitus: No Fluctuance: No Friable: No Rash: No Scarring: No Periwound Skin Moist: Yes N/A N/A Moisture: Maceration: No Dry/Scaly: No Periwound Skin Color: Atrophie Blanche: No N/A N/A Cyanosis: No Ecchymosis: No Erythema: No Hemosiderin Staining: No Mottled: No Pallor: No Rubor: No Erythema Location: N/A N/A N/A Temperature: No Abnormality N/A N/A Tenderness on Yes N/A N/A Palpation: Wound Preparation: Ulcer Cleansing: N/A N/A Rinsed/Irrigated with Saline Topical Anesthetic Applied: Other: lidocaine 4% Treatment Notes Mcnee, Bentlie V. (  [id number]) Electronic Signature(s) Signed: 05/30/2016 5:50:44 PM By: Alejandro Mulling Entered By: Alejandro Mulling on 05/30/2016 14:45:09 [name] Otto ([id number]) -------------------------------------------------------------------------------- Multi-Disciplinary Care Plan Details Patient Name: [name] Otto Date of Service: 05/30/2016 2:15 PM Medical Record Number: [id number] Patient Account Number: [id number] Date of Birth/Sex: 06-29-1946 (70 y.o. Male) Treating RN:  Phillis Haggis Primary Care Physician: Oretha Milch Other Clinician: Referring Physician: Oretha Milch Treating Physician/Extender: Rudene Re in Treatment: 21 Active Inactive Abuse / Safety / Falls / Self Care Management Nursing Diagnoses: Potential for falls Goals: Patient will remain injury free Date Initiated: 03/01/2016 Goal Status: Active Interventions: Assess fall risk on admission and as needed Notes: Nutrition Nursing Diagnoses: Imbalanced nutrition Goals: Patient/caregiver agrees to and verbalizes understanding of need to use nutritional supplements and/or vitamins as prescribed Date Initiated: 03/01/2016 Goal Status: Active Interventions: Assess patient nutrition upon admission and as needed per policy Notes: Orientation to the Wound Care Program Nursing Diagnoses: Knowledge deficit related to the wound healing center program Goals: Patient/caregiver will verbalize understanding of the [facility] [address] SRIJAN, GIVAN ([id number]) Date Initiated: 03/01/2016 Goal Status: Active Interventions: Provide education on orientation to the wound center Notes: Pain, Acute or Chronic Nursing Diagnoses: Pain, acute or chronic: actual or potential Potential alteration in comfort, pain Goals: Patient will verbalize adequate pain control and receive pain control interventions during procedures as needed Date Initiated: 03/01/2016 Goal Status: Active Patient/caregiver will verbalize adequate pain control between visits Date Initiated: 03/01/2016 Goal Status: Active Interventions: Assess comfort goal upon admission Complete pain assessment as per visit requirements Notes: Pressure Nursing Diagnoses: Knowledge deficit related to causes and risk factors for pressure ulcer development Knowledge deficit related to management of pressures ulcers Goals: Patient/caregiver will verbalize risk factors for pressure ulcer development Date Initiated:  03/01/2016 Goal Status: Active Interventions: Assess offloading mechanisms upon admission and as needed Notes: Wound/Skin Impairment Nursing Diagnoses: ORYN, CASANOVA ([id number]) Impaired tissue integrity Goals: Ulcer/skin breakdown will have a volume reduction of 30% by week 4 Date Initiated: 03/01/2016 Goal Status: Active Ulcer/skin breakdown will have a volume reduction of 50% by week 8 Date Initiated: 03/01/2016 Goal Status: Active Ulcer/skin breakdown will have a volume reduction of 80% by week 12 Date Initiated: 03/01/2016 Goal Status: Active Interventions: Assess ulceration(s) every visit Notes: Electronic Signature(s) Signed: 05/30/2016 5:50:44 PM By: Alejandro Mulling Entered By: Alejandro Mulling on 05/30/2016 14:44:39 Gorey, Kass V. ([id number]) -------------------------------------------------------------------------------- Pain Assessment Details Patient Name: [name] Otto. Date of Service: 05/30/2016 2:15 PM Medical Record Number: [id number] Patient Account Number: [id number] Date of Birth/Sex: 04/22/1946 (70 y.o. Male) Treating RN: Phillis Haggis Primary Care Physician: Oretha Milch Other Clinician: Referring Physician: Oretha Milch Treating Physician/Extender: Rudene Re in Treatment: 21 Active Problems Location of Pain Severity and Description of Pain Patient Has Paino No Site Locations With Dressing Change: No Pain Management and Medication Current Pain Management: Electronic Signature(s) Signed: 05/30/2016 5:50:44 PM By: Alejandro Mulling Entered By: Alejandro Mulling on 05/30/2016 14:25:25 [name] Otto ([id number]) -------------------------------------------------------------------------------- Patient/Caregiver Education Details Patient Name: [name] Otto. Date of Service: 05/30/2016 2:15 PM Medical Record Number: [id number] Patient Account Number: [id number] Date of Birth/Gender: [birth year] (70 y.o. Male) Treating RN: Phillis Haggis Primary Care Physician: Oretha Milch Other Clinician: Referring Physician: Oretha Milch Treating Physician/Extender: Rudene Re in Treatment: 21 Education Assessment Education Provided To: Patient Education Topics Provided Wound/Skin Impairment: Handouts: Other: change dressings as directed Methods: Demonstration, Explain/Verbal Responses: State content correctly Electronic Signature(s) Signed: 05/30/2016 5:50:44 PM By: Alejandro Mulling Entered  By: Alejandro Mulling on 05/30/2016 14:40:54 Steger, Merrell Seth Bake ([id number]) -------------------------------------------------------------------------------- Wound Assessment Details Patient Name: [name] Otto. Date of Service: 05/30/2016 2:15 PM Medical Record Number: [id number] Patient Account Number: [id number] Date of Birth/Sex: [birth year] (70 y.o. Male) Treating RN: Phillis Haggis Primary Care Physician: Oretha Milch Other Clinician: Referring Physician: Oretha Milch Treating Physician/Extender: Rudene Re in Treatment: 21 Wound Status Wound Number: 1 Primary Venous Leg Ulcer Etiology: Wound Location: Left Lower Leg Wound Open Wounding Event: Gradually Appeared Status: Date Acquired: 12/31/2014 Comorbid Cataracts, Chronic Obstructive Weeks Of Treatment: 21 History: Pulmonary Disease (COPD), Sleep Clustered Wound: No Apnea, Angina, Congestive Heart Failure, Hypertension, Type II Diabetes, Gout, Osteoarthritis, Neuropathy Photos Photo Uploaded By: Alejandro Mulling on 05/30/2016 17:41:38 Wound Measurements Length: (cm) 3 Width: (cm) 3 Depth: (cm) 0.1 Area: (cm) 7.069 Volume: (cm) 0.707 % Reduction in Area: 71.2% % Reduction in Volume: 71.1% Epithelialization: Medium (34-66%) Tunneling: No Undermining: No Wound Description Classification: Partial Thickness Foul Odor Af Diabetic Severity (Wagner): Grade 1 Wound Margin: Flat and Intact Exudate Amount: Large Exudate Type: Serous Exudate Color:  amber ter Cleansing: No Wound Bed Granulation Amount: Large (67-100%) Exposed Structure Cumming, [name] ([id number]) Granulation Quality: Pink Fascia Exposed: No Necrotic Amount: Small (1-33%) Fat Layer Exposed: No Necrotic Quality: Eschar Tendon Exposed: No Muscle Exposed: No Joint Exposed: No Bone Exposed: No Limited to Skin Breakdown Periwound Skin Texture Texture Color No Abnormalities Noted: No No Abnormalities Noted: No Localized Edema: Yes Erythema: Yes Erythema Location: Circumferential Moisture No Abnormalities Noted: No Temperature / Pain Moist: Yes Temperature: No Abnormality Tenderness on Palpation: Yes Wound Preparation Ulcer Cleansing: Other: soap and water, Topical Anesthetic Applied: None Treatment Notes Wound #1 (Left Lower Leg) 1. Cleansed with: Cleanse wound with antibacterial soap and water 4. Dressing Applied: Aquacel Ag 5. Secondary Dressing Applied ABD Pad 7. Secured with Tape 3 Layer Compression System - Bilateral Electronic Signature(s) Signed: 05/30/2016 5:50:44 PM By: Alejandro Mulling Entered By: Alejandro Mulling on 05/30/2016 14:39:10 Walt, Gurdeep VMarland Kitchen ([id number]) -------------------------------------------------------------------------------- Wound Assessment Details Patient Name: [name] Otto. Date of Service: 05/30/2016 2:15 PM Medical Record Number: [id number] Patient Account Number: [id number] Date of Birth/Sex: [birth year] (70 y.o. Male) Treating RN: Phillis Haggis Primary Care Physician: Oretha Milch Other Clinician: Referring Physician: Oretha Milch Treating Physician/Extender: Rudene Re in Treatment: 21 Wound Status Wound Number: 2 Primary Venous Leg Ulcer Etiology: Wound Location: Right Lower Leg Wound Open Wounding Event: Gradually Appeared Status: Date Acquired: 12/31/2014 Comorbid Cataracts, Chronic Obstructive Weeks Of Treatment: 21 History: Pulmonary Disease (COPD), Sleep Clustered Wound:  No Apnea, Angina, Congestive Heart Failure, Hypertension, Type II Diabetes, Gout, Osteoarthritis, Neuropathy Photos Photo Uploaded By: Alejandro Mulling on 05/30/2016 17:41:58 Wound Measurements Length: (cm) 4.5 Width: (cm) 5 Depth: (cm) 0.1 Area: (cm) 17.671 Volume: (cm) 1.767 % Reduction in Area: -7387.7% % Reduction in Volume: -7262.5% Epithelialization: Medium (34-66%) Tunneling: No Undermining: No Wound Description Classification: Partial Thickness Foul Odor A Diabetic Severity (Wagner): Grade 1 Wound Margin: Flat and Intact Exudate Amount: Large Exudate Type: Serosanguineous Exudate Color: red, brown fter Cleansing: No Wound Bed Granulation Amount: Large (67-100%) Exposed Structure Petraitis, [name] ([id number]) Granulation Quality: Red Fascia Exposed: No Necrotic Amount: Small (1-33%) Fat Layer Exposed: No Necrotic Quality: Eschar Tendon Exposed: No Muscle Exposed: No Joint Exposed: No Bone Exposed: No Limited to Skin Breakdown Periwound Skin Texture Texture Color No Abnormalities Noted: No No Abnormalities Noted: No Localized Edema: Yes Erythema: Yes Erythema Location: Circumferential Moisture No Abnormalities Noted: No Temperature / Pain Maceration: No  Temperature: No Abnormality Moist: Yes Tenderness on Palpation: Yes Wound Preparation Ulcer Cleansing: Other: soap and water, Topical Anesthetic Applied: None Treatment Notes Wound #2 (Right Lower Leg) 1. Cleansed with: Cleanse wound with antibacterial soap and water 4. Dressing Applied: Aquacel Ag 5. Secondary Dressing Applied ABD Pad 7. Secured with Tape 3 Layer Compression System - Bilateral Electronic Signature(s) Signed: 05/30/2016 5:50:44 PM By: Alejandro Mulling Entered By: Alejandro Mulling on 05/30/2016 14:38:48 [name] Otto ([id number]) -------------------------------------------------------------------------------- Wound Assessment Details Patient Name: [name] Otto. Date of Service: 05/30/2016 2:15 PM Medical Record Number: [id number] Patient Account Number: [id number] Date of Birth/Sex: [birth year] (70 y.o. Male) Treating RN: Phillis Haggis Primary Care Physician: Oretha Milch Other Clinician: Referring Physician: Oretha Milch Treating Physician/Extender: Rudene Re in Treatment: 21 Wound Status Wound Number: 3 Primary Pressure Ulcer Etiology: Wound Location: Right Calcaneus Wound Open Wounding Event: Pressure Injury Status: Date Acquired: 10/31/2015 Comorbid Cataracts, Chronic Obstructive Weeks Of Treatment: 21 History: Pulmonary Disease (COPD), Sleep Clustered Wound: No Apnea, Angina, Congestive Heart Failure, Hypertension, Type II Diabetes, Gout, Osteoarthritis, Neuropathy Photos Photo Uploaded By: Alejandro Mulling on 05/30/2016 17:42:28 Wound Measurements Length: (cm) 1 Width: (cm) 2.8 Depth: (cm) 0.2 Area: (cm) 2.199 Volume: (cm) 0.44 % Reduction in Area: 68.9% % Reduction in Volume: 68.9% Epithelialization: None Tunneling: No Undermining: No Wound Description Classification: Category/Stage III Foul Odor A Diabetic Severity (Wagner): Grade 1 Wound Margin: Flat and Intact Exudate Amount: Large Exudate Type: Serous Exudate Color: amber fter Cleansing: No Wound Bed Granulation Amount: Medium (34-66%) Exposed Structure Mctigue, Mehar V. ([id number]) Granulation Quality: Red, Pink Fascia Exposed: No Necrotic Amount: Medium (34-66%) Fat Layer Exposed: No Necrotic Quality: Adherent Slough Tendon Exposed: No Muscle Exposed: No Joint Exposed: No Bone Exposed: No Limited to Skin Breakdown Periwound Skin Texture Texture Color No Abnormalities Noted: No No Abnormalities Noted: No Localized Edema: Yes Erythema: Yes Erythema Location: Circumferential Moisture No Abnormalities Noted: No Temperature / Pain Maceration: Yes Temperature: No Abnormality Moist: Yes Tenderness on Palpation: Yes Wound  Preparation Ulcer Cleansing: Rinsed/Irrigated with Saline Topical Anesthetic Applied: Other: lidocaine 4%, Treatment Notes Wound #3 (Right Calcaneus) 1. Cleansed with: Cleanse wound with antibacterial soap and water 2. Anesthetic Topical Lidocaine 4% cream to wound bed prior to debridement 3. Peri-wound Care: Barrier cream 4. Dressing Applied: Prisma Ag 5. Secondary Dressing Applied ABD Pad Dry Gauze Kerlix/Conform 7. Secured with Secretary/administrator) Signed: 05/30/2016 5:50:44 PM By: Alejandro Mulling Entered By: Alejandro Mulling on 05/30/2016 14:36:52 Shankel, Kayman V. ([id number]) -------------------------------------------------------------------------------- Wound Assessment Details Patient Name: [name] Otto. Date of Service: 05/30/2016 2:15 PM Medical Record Number: [id number] Patient Account Number: [id number] Date of Birth/Sex: 1946-09-21 (70 y.o. Male) Treating RN: Phillis Haggis Primary Care Physician: Oretha Milch Other Clinician: Referring Physician: Oretha Milch Treating Physician/Extender: Rudene Re in Treatment: 21 Wound Status Wound Number: 6 Primary Pressure Ulcer Etiology: Wound Location: Right Foot - Lateral Wound Open Wounding Event: Pressure Injury Status: Date Acquired: 02/16/2016 Comorbid Cataracts, Chronic Obstructive Weeks Of Treatment: 14 History: Pulmonary Disease (COPD), Sleep Clustered Wound: No Apnea, Angina, Congestive Heart Failure, Hypertension, Type II Diabetes, Gout, Osteoarthritis, Neuropathy Photos Photo Uploaded By: Alejandro Mulling on 05/30/2016 17:42:45 Wound Measurements Length: (cm) 0.1 Width: (cm) 0.1 Depth: (cm) 0.1 Area: (cm) 0.008 Volume: (cm) 0.001 % Reduction in Area: 94.9% % Reduction in Volume: 93.8% Epithelialization: Medium (34-66%) Tunneling: No Undermining: No Wound Description Classification: Category/Stage II Foul Odor Diabetic Severity [address]): Grade 1 Wound Margin: Flat and  Intact Exudate Amount: Small Exudate Type:  Serosanguineous Exudate Color: red, brown After Cleansing: No Wound Bed Granulation Amount: Large (67-100%) Exposed Structure Crigler, Kaymon V. ([id number]) Granulation Quality: Pink Fascia Exposed: No Necrotic Amount: None Present (0%) Fat Layer Exposed: No Tendon Exposed: No Muscle Exposed: No Joint Exposed: No Bone Exposed: No Limited to Skin Breakdown Periwound Skin Texture Texture Color No Abnormalities Noted: No No Abnormalities Noted: No Callus: No Atrophie Blanche: No Crepitus: No Cyanosis: No Excoriation: No Ecchymosis: No Fluctuance: No Erythema: No Friable: No Hemosiderin Staining: No Induration: No Mottled: No Localized Edema: No Pallor: No Rash: No Rubor: No Scarring: No Temperature / Pain Moisture Temperature: No Abnormality No Abnormalities Noted: No Tenderness on Palpation: Yes Dry / Scaly: No Maceration: No Moist: Yes Wound Preparation Ulcer Cleansing: Rinsed/Irrigated with Saline Topical Anesthetic Applied: Other: lidocaine 4%, Treatment Notes Wound #6 (Right, Lateral Foot) 1. Cleansed with: Cleanse wound with antibacterial soap and water 4. Dressing Applied: Aquacel Ag 5. Secondary Dressing Applied Foam 7. Secured with Tape 3 Layer Compression System - Bilateral Electronic Signature(s) Signed: 05/30/2016 5:50:44 PM By: Alejandro Mulling Entered By: Alejandro Mulling on 05/30/2016 14:37:29 Nedrow, Doris V. ([id number]) [name], Jushua VMarland Kitchen ([id number]) -------------------------------------------------------------------------------- Vitals Details Patient Name: [name] Otto. Date of Service: 05/30/2016 2:15 PM Medical Record Number: [id number] Patient Account Number: [id number] Date of Birth/Sex: 31-Jul-1946 (70 y.o. Male) Treating RN: Phillis Haggis Primary Care Physician: Oretha Milch Other Clinician: Referring Physician: Oretha Milch Treating Physician/Extender: Rudene Re in  Treatment: 21 Vital Signs Time Taken: 14:39 Temperature (F): 97.6 Height (in): 70 Pulse (bpm): 76 Weight (lbs): 235 Respiratory Rate (breaths/min): 20 Body Mass Index (BMI): 33.7 Blood Pressure (mmHg): 114/66 Reference Range: 80 - 120 mg / dl Electronic Signature(s) Signed: 05/30/2016 5:50:44 PM By: Alejandro Mulling Entered By: Alejandro Mulling on 05/30/2016 14:39:36

## 2016-06-05 ENCOUNTER — Encounter: Payer: Self-pay | Admitting: *Deleted

## 2016-06-06 ENCOUNTER — Encounter: Payer: Medicare Other | Admitting: Surgery

## 2016-06-06 DIAGNOSIS — E11621 Type 2 diabetes mellitus with foot ulcer: Secondary | ICD-10-CM | POA: Diagnosis not present

## 2016-06-07 NOTE — Progress Notes (Signed)
[name], [name] ([id number]) Visit Report for 06/06/2016 Arrival Information Details Patient Name: [name], [name] Date of Service: 06/06/2016 2:15 PM Medical Record Number: [id number] Patient Account Number: [id number] Date of Birth/Sex: 01/25/1946 (70 y.o. Male) Treating RN: [name] Primary Care Physician: Oretha Milch Other Clinician: Referring Physician: Oretha Milch Treating Physician/Extender: Rudene Re in Treatment: 22 Visit Information History Since Last Visit Added or deleted any medications: No Patient Arrived: Wheel Chair Any new allergies or adverse reactions: No Arrival Time: 14:43 Had a fall or experienced change in No activities of daily living that may affect Accompanied By: self risk of falls: Transfer Assistance: None Signs or symptoms of abuse/neglect since last No Patient Identification Verified: Yes visito Secondary Verification Process Yes Hospitalized since last visit: No Completed: Pain Present Now: No Patient Requires Transmission-Based No Precautions: Patient Has Alerts: Yes Patient Alerts: DM II Electronic Signature(s) Signed: 06/06/2016 4:22:06 PM By: [name] Entered By: [name] on 06/06/2016 14:43:37 Niday, Jacier Seth Bake ([id number]) -------------------------------------------------------------------------------- Encounter Discharge Information Details Patient Name: [name] Date of Service: 06/06/2016 2:15 PM Medical Record Number: [id number] Patient Account Number: [id number] Date of Birth/Sex: [birth year] (70 y.o. Male) Treating RN: [name] Primary Care Physician: Oretha Milch Other Clinician: Referring Physician: Oretha Milch Treating Physician/Extender: Rudene Re in Treatment: 54 Encounter Discharge Information Items Discharge Pain Level: 0 Discharge Condition: Stable Ambulatory Status: Wheelchair Discharge Destination: Nursing Home Transportation: Private Auto Accompanied By:  self Schedule Follow-up Appointment: Yes Medication Reconciliation completed and provided to Patient/Care No Vali Capano: Provided on Clinical Summary of Care: 06/06/2016 Form Type Recipient Paper Patient EL Electronic Signature(s) Signed: 06/06/2016 3:19:44 PM By: Gwenlyn Perking Entered By: Gwenlyn Perking on 06/06/2016 15:19:44 Labrosse, Azlan Seth Bake ([id number]) -------------------------------------------------------------------------------- Lower Extremity Assessment Details Patient Name: [name] Date of Service: 06/06/2016 2:15 PM Medical Record Number: [id number] Patient Account Number: [id number] Date of Birth/Sex: [birth year] (70 y.o. Male) Treating RN: [name] Primary Care Physician: Oretha Milch Other Clinician: Referring Physician: Oretha Milch Treating Physician/Extender: Rudene Re in Treatment: 22 Edema Assessment Assessed: [Left: No] [Right: No] Edema: [Left: Yes] [Right: Yes] Calf Left: Right: Point of Measurement: 34 cm From Medial Instep cm cm Ankle Left: Right: Point of Measurement: 12 cm From Medial Instep cm cm Vascular Assessment Pulses: Posterior Tibial Dorsalis Pedis Palpable: [Left:Yes] [Right:Yes] Extremity colors, hair growth, and conditions: Extremity Color: [Left:Hyperpigmented] [Right:Hyperpigmented] Hair Growth on Extremity: [Left:No] [Right:No] Temperature of Extremity: [Left:Warm] [Right:Warm] Capillary Refill: [Left:< 3 seconds] [Right:< 3 seconds] Electronic Signature(s) Signed: 06/06/2016 4:22:06 PM By: [name] Entered By: [name] on 06/06/2016 14:51:11 Louro, Shemar VMarland Kitchen ([id number]) -------------------------------------------------------------------------------- Multi Wound Chart Details Patient Name: [name] Date of Service: 06/06/2016 2:15 PM Medical Record Number: [id number] Patient Account Number: [id number] Date of Birth/Sex: 28-Jan-1946 (70 y.o. Male) Treating RN: [name] Primary Care  Physician: Oretha Milch Other Clinician: Referring Physician: Oretha Milch Treating Physician/Extender: Rudene Re in Treatment: 22 Vital Signs Height(in): 70 Pulse(bpm): 78 Weight(lbs): 235 Blood Pressure 137/71 (mmHg): Body Mass Index(BMI): 34 Temperature(F): 98.1 Respiratory Rate 18 (breaths/min): Photos: Wound Location: Left Lower Leg Right Lower Leg Right Calcaneus Wounding Event: Gradually Appeared Gradually Appeared Pressure Injury Primary Etiology: Venous Leg Ulcer Venous Leg Ulcer Pressure Ulcer Comorbid History: Cataracts, Chronic Cataracts, Chronic Cataracts, Chronic Obstructive Pulmonary Obstructive Pulmonary Obstructive Pulmonary Disease (COPD), Sleep Disease (COPD), Sleep Disease (COPD), Sleep Apnea, Angina, Apnea, Angina, Apnea, Angina, Congestive Heart Failure, Congestive Heart Failure, Congestive Heart Failure, Hypertension, Type II Hypertension, Type II Hypertension, Type II Diabetes,  Gout, Diabetes, Gout, Diabetes, Gout, Osteoarthritis, Neuropathy Osteoarthritis, Neuropathy Osteoarthritis, Neuropathy Date Acquired: 12/31/2014 12/31/2014 10/31/2015 Weeks of Treatment: 22 22 22  Wound Status: Open Open Open Measurements L x W x D 0.5x1x0.1 3.2x0.5x0.1 1x1.2x0.2 (cm) Area (cm) : 0.393 1.257 0.942 Volume (cm) : 0.039 0.126 0.188 % Reduction in Area: 98.40% -432.60% 86.70% % Reduction in Volume: 98.40% -425.00% 86.70% Classification: Partial Thickness Partial Thickness Category/Stage III HBO Classification: Grade 1 Grade 1 Grade 1 Exudate Amount: Large Large Large Mehlberg, Aram V. ([id number]) Exudate Type: Serous Serosanguineous Serous Exudate Color: amber red, brown amber Wound Margin: Flat and Intact Flat and Intact Flat and Intact Granulation Amount: Large (67-100%) None Present (0%) Medium (34-66%) Granulation Quality: Pink N/A Red, Pink Necrotic Amount: Small (1-33%) Large (67-100%) Medium (34-66%) Necrotic Tissue: Eschar Eschar Adherent  Slough Exposed Structures: Fascia: No Fascia: No Fascia: No Fat: No Fat: No Fat: No Tendon: No Tendon: No Tendon: No Muscle: No Muscle: No Muscle: No Joint: No Joint: No Joint: No Bone: No Bone: No Bone: No Limited to Skin Limited to Skin Limited to Skin Breakdown Breakdown Breakdown Epithelialization: Medium (34-66%) Medium (34-66%) None Periwound Skin Texture: Edema: Yes Edema: Yes Edema: Yes Periwound Skin Moist: Yes Moist: Yes Maceration: Yes Moisture: Maceration: No Moist: Yes Periwound Skin Color: Erythema: Yes Erythema: Yes Erythema: Yes Erythema Location: Circumferential Circumferential Circumferential Temperature: No Abnormality No Abnormality No Abnormality Tenderness on Yes Yes Yes Palpation: Wound Preparation: Ulcer Cleansing: Other: Ulcer Cleansing: Other: Ulcer Cleansing: soap and water soap and water Rinsed/Irrigated with Saline Topical Anesthetic Topical Anesthetic Applied: None Applied: Other: lidocaine Topical Anesthetic 4% Applied: Other: lidocaine 4% Wound Number: 6 N/A N/A Photos: N/A N/A Wound Location: Right Foot - Lateral N/A N/A Wounding Event: Pressure Injury N/A N/A Primary Etiology: Pressure Ulcer N/A N/A Comorbid History: Cataracts, Chronic N/A N/A Obstructive Pulmonary Disease (COPD), Sleep Apnea, Angina, Congestive Heart Failure, Hypertension, Type II Martensen, Finnis V. ([id number]) Diabetes, Gout, Osteoarthritis, Neuropathy Date Acquired: 02/16/2016 N/A N/A Weeks of Treatment: 15 N/A N/A Wound Status: Healed - Epithelialized N/A N/A Measurements L x W x D 0x0x0 N/A N/A (cm) Area (cm) : 0 N/A N/A Volume (cm) : 0 N/A N/A % Reduction in Area: 100.00% N/A N/A % Reduction in Volume: 100.00% N/A N/A Classification: Category/Stage II N/A N/A HBO Classification: Grade 1 N/A N/A Exudate Amount: Small N/A N/A Exudate Type: Serosanguineous N/A N/A Exudate Color: red, brown N/A N/A Wound Margin: Flat and Intact N/A  N/A Granulation Amount: Large (67-100%) N/A N/A Granulation Quality: Pink N/A N/A Necrotic Amount: None Present (0%) N/A N/A Necrotic Tissue: N/A N/A N/A Exposed Structures: Fascia: No N/A N/A Fat: No Tendon: No Muscle: No Joint: No Bone: No Limited to Skin Breakdown Epithelialization: Medium (34-66%) N/A N/A Periwound Skin Texture: Edema: No N/A N/A Excoriation: No Induration: No Callus: No Crepitus: No Fluctuance: No Friable: No Rash: No Scarring: No Periwound Skin Moist: Yes N/A N/A Moisture: Maceration: No Dry/Scaly: No Periwound Skin Color: Atrophie Blanche: No N/A N/A Cyanosis: No Ecchymosis: No Erythema: No Hemosiderin Staining: No Mottled: No Pallor: No Rubor: No Schiavi, Lugene V. ([id number]) Erythema Location: N/A N/A N/A Temperature: No Abnormality N/A N/A Tenderness on Yes N/A N/A Palpation: Wound Preparation: Ulcer Cleansing: N/A N/A Rinsed/Irrigated with Saline Topical Anesthetic Applied: None Treatment Notes Electronic Signature(s) Signed: 06/06/2016 4:22:06 PM By: [name] Entered By: [name] on 06/06/2016 14:56:15 [name] ([id number]) -------------------------------------------------------------------------------- Multi-Disciplinary Care Plan Details Patient Name: [name] Date of Service: 06/06/2016 2:15 PM Medical  Record Number: [id number] Patient Account Number: [id number] Date of Birth/Sex: Jan 24, 1946 (70 y.o. Male) Treating RN: [name] Primary Care Physician: Oretha Milch Other Clinician: Referring Physician: Oretha Milch Treating Physician/Extender: Rudene Re in Treatment: 22 Active Inactive Abuse / Safety / Falls / Self Care Management Nursing Diagnoses: Potential for falls Goals: Patient will remain injury free Date Initiated: 03/01/2016 Goal Status: Active Interventions: Assess fall risk on admission and as needed Notes: Nutrition Nursing Diagnoses: Imbalanced  nutrition Goals: Patient/caregiver agrees to and verbalizes understanding of need to use nutritional supplements and/or vitamins as prescribed Date Initiated: 03/01/2016 Goal Status: Active Interventions: Assess patient nutrition upon admission and as needed per policy Notes: Orientation to the Wound Care Program Nursing Diagnoses: Knowledge deficit related to the wound healing center program Goals: Patient/caregiver will verbalize understanding of the Wound Healing Center [address] [place], [place] ([id number]) Date Initiated: 03/01/2016 Goal Status: Active Interventions: Provide education on orientation to the wound center Notes: Pain, Acute or Chronic Nursing Diagnoses: Pain, acute or chronic: actual or potential Potential alteration in comfort, pain Goals: Patient will verbalize adequate pain control and receive pain control interventions during procedures as needed Date Initiated: 03/01/2016 Goal Status: Active Patient/caregiver will verbalize adequate pain control between visits Date Initiated: 03/01/2016 Goal Status: Active Interventions: Assess comfort goal upon admission Complete pain assessment as per visit requirements Notes: Pressure Nursing Diagnoses: Knowledge deficit related to causes and risk factors for pressure ulcer development Knowledge deficit related to management of pressures ulcers Goals: Patient/caregiver will verbalize risk factors for pressure ulcer development Date Initiated: 03/01/2016 Goal Status: Active Interventions: Assess offloading mechanisms upon admission and as needed Notes: Wound/Skin Impairment Nursing Diagnoses: ELISA, SORLIE ([id number]) Impaired tissue integrity Goals: Ulcer/skin breakdown will have a volume reduction of 30% by week 4 Date Initiated: 03/01/2016 Goal Status: Active Ulcer/skin breakdown will have a volume reduction of 50% by week 8 Date Initiated: 03/01/2016 Goal Status: Active Ulcer/skin breakdown will have  a volume reduction of 80% by week 12 Date Initiated: 03/01/2016 Goal Status: Active Interventions: Assess ulceration(s) every visit Notes: Electronic Signature(s) Signed: 06/06/2016 4:22:06 PM By: [name] Entered By: [name] on 06/06/2016 14:56:06 Radigan, Lantz V. ([id number]) -------------------------------------------------------------------------------- Pain Assessment Details Patient Name: [name] Date of Service: 06/06/2016 2:15 PM Medical Record Number: [id number] Patient Account Number: [id number] Date of Birth/Sex: 10/27/1946 (70 y.o. Male) Treating RN: [name] Primary Care Physician: Oretha Milch Other Clinician: Referring Physician: Oretha Milch Treating Physician/Extender: Rudene Re in Treatment: 22 Active Problems Location of Pain Severity and Description of Pain Patient Has Paino No Site Locations Pain Management and Medication Current Pain Management: Notes Topical or injectable lidocaine is offered to patient for acute pain when surgical debridement is performed. If needed, Patient is instructed to use over the counter pain medication for the following 24-48 hours after debridement. Wound care MDs do not prescribed pain medications. Patient has chronic pain or uncontrolled pain. Patient has been instructed to make an appointment with their Primary Care Physician for pain management. Electronic Signature(s) Signed: 06/06/2016 4:22:06 PM By: [name] Entered By: [name] on 06/06/2016 14:43:46 [name] ([id number]) -------------------------------------------------------------------------------- Patient/Caregiver Education Details Patient Name: [name] Date of Service: 06/06/2016 2:15 PM Medical Record Number: [id number] Patient Account Number: [id number] Date of Birth/Gender: 05-May-1946 (70 y.o. Male) Treating RN: [name] Primary Care Physician: Oretha Milch Other Clinician: Referring  Physician: Oretha Milch Treating Physician/Extender: Rudene Re in Treatment: 22 Education Assessment Education Provided To: Patient Education Topics Provided  Wound/Skin Impairment: Handouts: Other: keep pressure off heel Methods: Explain/Verbal Responses: State content correctly Electronic Signature(s) Signed: 06/06/2016 4:22:06 PM By: [name] Entered By: [name] on 06/06/2016 15:23:20 Rawlins, Reichen V. ([id number]) -------------------------------------------------------------------------------- Wound Assessment Details Patient Name: [name] Date of Service: 06/06/2016 2:15 PM Medical Record Number: [id number] Patient Account Number: [id number] Date of Birth/Sex: [birth year] (70 y.o. Male) Treating RN: [name] Primary Care Physician: Oretha Milch Other Clinician: Referring Physician: Oretha Milch Treating Physician/Extender: Rudene Re in Treatment: 22 Wound Status Wound Number: 1 Primary Venous Leg Ulcer Etiology: Wound Location: Left Lower Leg Wound Open Wounding Event: Gradually Appeared Status: Date Acquired: 12/31/2014 Comorbid Cataracts, Chronic Obstructive Weeks Of Treatment: 22 History: Pulmonary Disease (COPD), Sleep Clustered Wound: No Apnea, Angina, Congestive Heart Failure, Hypertension, Type II Diabetes, Gout, Osteoarthritis, Neuropathy Photos Wound Measurements Length: (cm) 0.5 Width: (cm) 1 Depth: (cm) 0.1 Area: (cm) 0.393 Volume: (cm) 0.039 % Reduction in Area: 98.4% % Reduction in Volume: 98.4% Epithelialization: Medium (34-66%) Tunneling: No Undermining: No Wound Description Classification: Partial Thickness Foul Odor Af Diabetic Severity (Wagner): Grade 1 Wound Margin: Flat and Intact Exudate Amount: Large Exudate Type: Serous Exudate Color: amber ter Cleansing: No Wound Bed Granulation Amount: Large (67-100%) Exposed Structure Granulation Quality: Pink Fascia Exposed: No Simmering, Aqil  V. ([id number]) Necrotic Amount: Small (1-33%) Fat Layer Exposed: No Necrotic Quality: Eschar Tendon Exposed: No Muscle Exposed: No Joint Exposed: No Bone Exposed: No Limited to Skin Breakdown Periwound Skin Texture Texture Color No Abnormalities Noted: No No Abnormalities Noted: No Localized Edema: Yes Erythema: Yes Erythema Location: Circumferential Moisture No Abnormalities Noted: No Temperature / Pain Moist: Yes Temperature: No Abnormality Tenderness on Palpation: Yes Wound Preparation Ulcer Cleansing: Other: soap and water, Topical Anesthetic Applied: None Treatment Notes Wound #1 (Left Lower Leg) 1. Cleansed with: Cleanse wound with antibacterial soap and water 2. Anesthetic Topical Lidocaine 4% cream to wound bed prior to debridement 4. Dressing Applied: Aquacel Ag 5. Secondary Dressing Applied ABD Pad 7. Secured with 3 Layer Compression System - Bilateral Electronic Signature(s) Signed: 06/06/2016 4:22:06 PM By: [name] Entered By: [name] on 06/06/2016 14:53:48 [name] ([id number]) -------------------------------------------------------------------------------- Wound Assessment Details Patient Name: [name] Date of Service: 06/06/2016 2:15 PM Medical Record Number: [id number] Patient Account Number: [id number] Date of Birth/Sex: 1946-02-26 (70 y.o. Male) Treating RN: [name] Primary Care Physician: Oretha Milch Other Clinician: Referring Physician: Oretha Milch Treating Physician/Extender: Rudene Re in Treatment: 22 Wound Status Wound Number: 2 Primary Venous Leg Ulcer Etiology: Wound Location: Right Lower Leg Wound Open Wounding Event: Gradually Appeared Status: Date Acquired: 12/31/2014 Comorbid Cataracts, Chronic Obstructive Weeks Of Treatment: 22 History: Pulmonary Disease (COPD), Sleep Clustered Wound: No Apnea, Angina, Congestive Heart Failure, Hypertension, Type II Diabetes, Gout,  Osteoarthritis, Neuropathy Photos Wound Measurements Length: (cm) 3.2 Width: (cm) 0.5 Depth: (cm) 0.1 Area: (cm) 1.257 Volume: (cm) 0.126 % Reduction in Area: -432.6% % Reduction in Volume: -425% Epithelialization: Medium (34-66%) Tunneling: No Undermining: No Wound Description Classification: Partial Thickness Foul Odor Diabetic Severity (Wagner): Grade 1 Wound Margin: Flat and Intact Exudate Amount: Large Exudate Type: Serosanguineous Exudate Color: red, brown After Cleansing: No Wound Bed Granulation Amount: None Present (0%) Exposed Structure Necrotic Amount: Large (67-100%) Fascia Exposed: No Mignogna, Saahir V. ([id number]) Necrotic Quality: Eschar Fat Layer Exposed: No Tendon Exposed: No Muscle Exposed: No Joint Exposed: No Bone Exposed: No Limited to Skin Breakdown Periwound Skin Texture Texture Color No Abnormalities Noted: No No Abnormalities Noted: No Localized Edema:  Yes Erythema: Yes Erythema Location: Circumferential Moisture No Abnormalities Noted: No Temperature / Pain Maceration: No Temperature: No Abnormality Moist: Yes Tenderness on Palpation: Yes Wound Preparation Ulcer Cleansing: Other: soap and water, Topical Anesthetic Applied: Other: lidocaine 4%, Treatment Notes Wound #2 (Right Lower Leg) 1. Cleansed with: Cleanse wound with antibacterial soap and water 2. Anesthetic Topical Lidocaine 4% cream to wound bed prior to debridement 4. Dressing Applied: Aquacel Ag 5. Secondary Dressing Applied ABD Pad 7. Secured with 3 Layer Compression System - Bilateral Electronic Signature(s) Signed: 06/06/2016 4:22:06 PM By: [name] Entered By: [name] on 06/06/2016 14:54:44 Zanni, Divit VMarland Kitchen ([id number]) -------------------------------------------------------------------------------- Wound Assessment Details Patient Name: [name] Date of Service: 06/06/2016 2:15 PM Medical Record Number: [id number] Patient Account  Number: [id number] Date of Birth/Sex: 09-28-1946 (70 y.o. Male) Treating RN: [name] Primary Care Physician: Oretha Milch Other Clinician: Referring Physician: Oretha Milch Treating Physician/Extender: Rudene Re in Treatment: 22 Wound Status Wound Number: 3 Primary Pressure Ulcer Etiology: Wound Location: Right Calcaneus Wound Open Wounding Event: Pressure Injury Status: Date Acquired: 10/31/2015 Comorbid Cataracts, Chronic Obstructive Weeks Of Treatment: 22 History: Pulmonary Disease (COPD), Sleep Clustered Wound: No Apnea, Angina, Congestive Heart Failure, Hypertension, Type II Diabetes, Gout, Osteoarthritis, Neuropathy Photos Wound Measurements Length: (cm) 1 Width: (cm) 1.2 Depth: (cm) 0.2 Area: (cm) 0.942 Volume: (cm) 0.188 % Reduction in Area: 86.7% % Reduction in Volume: 86.7% Epithelialization: None Tunneling: No Undermining: No Wound Description Classification: Category/Stage III Foul Odor A Diabetic Severity (Wagner): Grade 1 Wound Margin: Flat and Intact Exudate Amount: Large Exudate Type: Serous Exudate Color: amber fter Cleansing: No Wound Bed Granulation Amount: Medium (34-66%) Exposed Structure Granulation Quality: Red, Pink Fascia Exposed: No Sacra, Yvonne V. ([id number]) Necrotic Amount: Medium (34-66%) Fat Layer Exposed: No Necrotic Quality: Adherent Slough Tendon Exposed: No Muscle Exposed: No Joint Exposed: No Bone Exposed: No Limited to Skin Breakdown Periwound Skin Texture Texture Color No Abnormalities Noted: No No Abnormalities Noted: No Localized Edema: Yes Erythema: Yes Erythema Location: Circumferential Moisture No Abnormalities Noted: No Temperature / Pain Maceration: Yes Temperature: No Abnormality Moist: Yes Tenderness on Palpation: Yes Wound Preparation Ulcer Cleansing: Rinsed/Irrigated with Saline Topical Anesthetic Applied: Other: lidocaine 4%, Treatment Notes Wound #3 (Right Calcaneus) 1.  Cleansed with: Clean wound with Normal Saline 2. Anesthetic Topical Lidocaine 4% cream to wound bed prior to debridement 4. Dressing Applied: Prisma Ag 5. Secondary Dressing Applied ABD and Kerlix/Conform 7. Secured with Secretary/administrator) Signed: 06/06/2016 4:22:06 PM By: [name] Entered By: [name] on 06/06/2016 14:55:22 Hashem, Lovelle VMarland Kitchen ([id number]) -------------------------------------------------------------------------------- Wound Assessment Details Patient Name: [name] Date of Service: 06/06/2016 2:15 PM Medical Record Number: [id number] Patient Account Number: [id number] Date of Birth/Sex: 14-Dec-1945 (70 y.o. Male) Treating RN: [name] Primary Care Physician: Oretha Milch Other Clinician: Referring Physician: Oretha Milch Treating Physician/Extender: Rudene Re in Treatment: 22 Wound Status Wound Number: 6 Primary Pressure Ulcer Etiology: Wound Location: Right Foot - Lateral Wound Healed - Epithelialized Wounding Event: Pressure Injury Status: Date Acquired: 02/16/2016 Comorbid Cataracts, Chronic Obstructive Weeks Of Treatment: 15 History: Pulmonary Disease (COPD), Sleep Clustered Wound: No Apnea, Angina, Congestive Heart Failure, Hypertension, Type II Diabetes, Gout, Osteoarthritis, Neuropathy Photos Wound Measurements Length: (cm) 0 % Reduction Width: (cm) 0 % Reduction Depth: (cm) 0 Epitheliali Area: (cm) 0 Tunneling: Volume: (cm) 0 Underminin in Area: 100% in Volume: 100% zation: Medium (34-66%) No g: No Wound Description Classification: Category/Stage II Foul Odor Diabetic Severity [address]): Grade 1 Wound Margin: Flat  and Intact Exudate Amount: Small Exudate Type: Serosanguineous Exudate Color: red, brown After Cleansing: No Wound Bed Granulation Amount: Large (67-100%) Exposed Structure Granulation Quality: Pink Fascia Exposed: No Bellamy, Ayush V. ([id number]) Necrotic Amount: None Present  (0%) Fat Layer Exposed: No Tendon Exposed: No Muscle Exposed: No Joint Exposed: No Bone Exposed: No Limited to Skin Breakdown Periwound Skin Texture Texture Color No Abnormalities Noted: No No Abnormalities Noted: No Callus: No Atrophie Blanche: No Crepitus: No Cyanosis: No Excoriation: No Ecchymosis: No Fluctuance: No Erythema: No Friable: No Hemosiderin Staining: No Induration: No Mottled: No Localized Edema: No Pallor: No Rash: No Rubor: No Scarring: No Temperature / Pain Moisture Temperature: No Abnormality No Abnormalities Noted: No Tenderness on Palpation: Yes Dry / Scaly: No Maceration: No Moist: Yes Wound Preparation Ulcer Cleansing: Rinsed/Irrigated with Saline Topical Anesthetic Applied: None Electronic Signature(s) Signed: 06/06/2016 4:22:06 PM By: [name] Entered By: [name] on 06/06/2016 14:55:50 Montemayor, Akshaj Seth Bake ([id number]) -------------------------------------------------------------------------------- Vitals Details Patient Name: [name] Date of Service: 06/06/2016 2:15 PM Medical Record Number: [id number] Patient Account Number: [id number] Date of Birth/Sex: 11-01-1946 (70 y.o. Male) Treating RN: [name] Primary Care Physician: Oretha Milch Other Clinician: Referring Physician: Oretha Milch Treating Physician/Extender: Rudene Re in Treatment: 22 Vital Signs Time Taken: 14:46 Temperature (F): 98.1 Height (in): 70 Pulse (bpm): 78 Weight (lbs): 235 Respiratory Rate (breaths/min): 18 Body Mass Index (BMI): 33.7 Blood Pressure (mmHg): 137/71 Reference Range: 80 - 120 mg / dl Electronic Signature(s) Signed: 06/06/2016 4:22:06 PM By: [name] Entered By: [name] on 06/06/2016 14:46:39

## 2016-06-08 NOTE — Progress Notes (Signed)
ANCIL, DEWAN ([id number]) Visit Report for 06/06/2016 Chief Complaint Document Details Patient Name: [name], [name] Date of Service: 06/06/2016 2:15 PM Medical Record Number: [id number] Patient Account Number: [id number] Date of Birth/Sex: 1946/02/01 (70 y.o. Male) Treating RN: Phillis Haggis Primary Care Physician: Oretha Milch Other Clinician: Referring Physician: Oretha Milch Treating Physician/Extender: Rudene Re in Treatment: 22 Information Obtained from: Patient Chief Complaint Patient is at the clinic for treatment of an open pressure ulcer the left upper thigh and gluteal region and the right heel with bilateral swelling of the legs all of it which is going on for over a year Electronic Signature(s) Signed: 06/06/2016 3:06:02 PM By: Evlyn Kanner MD, FACS Entered By: Evlyn Kanner on 06/06/2016 15:06:02 [name] ([id number]) -------------------------------------------------------------------------------- Debridement Details Patient Name: [name] Date of Service: 06/06/2016 2:15 PM Medical Record Number: [id number] Patient Account Number: [id number] Date of Birth/Sex: 1945/11/23 (70 y.o. Male) Treating RN: Phillis Haggis Primary Care Physician: Oretha Milch Other Clinician: Referring Physician: Oretha Milch Treating Physician/Extender: Rudene Re in Treatment: 22 Debridement Performed for Wound #2 Right Lower Leg Assessment: Performed By: Physician Evlyn Kanner, MD Debridement: Debridement Pre-procedure Yes Verification/Time Out Taken: Start Time: 14:59 Pain Control: Lidocaine 4% Topical Solution Level: Skin/Subcutaneous Tissue Total Area Debrided (L x 3.2 (cm) x 0.5 (cm) = 1.6 (cm) W): Tissue and other Viable, Non-Viable, Eschar, Fibrin/Slough, Subcutaneous material debrided: Instrument: Forceps Bleeding: Minimum Hemostasis Achieved: Pressure End Time: 15:01 Procedural Pain: 0 Post Procedural Pain: 0 Response to  Treatment: Procedure was tolerated well Post Debridement Measurements of Total Wound Length: (cm) 3.2 Width: (cm) 0.5 Depth: (cm) 0.1 Volume: (cm) 0.126 Post Procedure Diagnosis Same as Pre-procedure Electronic Signature(s) Signed: 06/06/2016 3:05:36 PM By: Evlyn Kanner MD, FACS Signed: 06/07/2016 4:45:48 PM By: Alejandro Mulling Entered By: Evlyn Kanner on 06/06/2016 15:05:36 [name], [name] ([id number]) -------------------------------------------------------------------------------- Debridement Details Patient Name: [name] Date of Service: 06/06/2016 2:15 PM Medical Record Number: [id number] Patient Account Number: [id number] Date of Birth/Sex: 08-17-1946 (70 y.o. Male) Treating RN: Phillis Haggis Primary Care Physician: Oretha Milch Other Clinician: Referring Physician: Oretha Milch Treating Physician/Extender: Rudene Re in Treatment: 22 Debridement Performed for Wound #3 Right Calcaneus Assessment: Performed By: Physician Evlyn Kanner, MD Debridement: Debridement Pre-procedure Yes Verification/Time Out Taken: Start Time: 15:01 Pain Control: Lidocaine 4% Topical Solution Level: Skin/Subcutaneous Tissue Total Area Debrided (L x 1 (cm) x 1.2 (cm) = 1.2 (cm) W): Tissue and other Viable, Non-Viable, Eschar, Fibrin/Slough, Skin, Subcutaneous material debrided: Instrument: Forceps Bleeding: Minimum Hemostasis Achieved: Pressure End Time: 15:03 Procedural Pain: 0 Post Procedural Pain: 0 Response to Treatment: Procedure was tolerated well Post Debridement Measurements of Total Wound Length: (cm) 1 Stage: Category/Stage III Width: (cm) 1.2 Depth: (cm) 0.2 Volume: (cm) 0.188 Post Procedure Diagnosis Same as Pre-procedure Electronic Signature(s) Signed: 06/06/2016 3:05:52 PM By: Evlyn Kanner MD, FACS Signed: 06/07/2016 4:45:48 PM By: Alejandro Mulling Entered By: Evlyn Kanner on 06/06/2016 15:05:52 [name], [name]  ([id number]) -------------------------------------------------------------------------------- HPI Details Patient Name: [name] Date of Service: 06/06/2016 2:15 PM Medical Record Number: [id number] Patient Account Number: [id number] Date of Birth/Sex: 1946-03-22 (70 y.o. Male) Treating RN: Phillis Haggis Primary Care Physician: Oretha Milch Other Clinician: Referring Physician: Oretha Milch Treating Physician/Extender: Rudene Re in Treatment: 22 History of Present Illness Location: ulcerated area on the right heel, left gluteal region and thigh and then bilateral lower extremities Quality: Patient reports experiencing a sharp pain to affected area(s). Severity: Patient states wound are getting worse. Duration: Patient  has had the wound for > 12 months prior to seeking treatment at the wound center Timing: Pain in wound is constant (hurts all the time) Context: The wound appeared gradually over time Modifying Factors: Other treatment(s) tried include: he sees his heart doctor and his primary care doctor Associated Signs and Symptoms: patient has not been able to walk for over a year now HPI Description: 70 year old gentleman with a known history of hypertension, diabetes, obstructive sleep apnea, COPD, diastolic CHF, coronary artery disease was admitted to the hospital with sepsis from an ulcer of the right heel and was treated there in October 2016. he also has chronic bilateral lower extremity edema and lymphedema. He had received vancomycin, Zosyn and at that stage and x-rays showed hardware in the right ankle but no evidence of osteomyelitis. he was a former smoker. he was also treated with Augmentin orally for 10 days. He is either bedbound or wheelchair-bound and does not ambulate by himself. 01/19/2016 -- he has not been seen here for 3 weeks and this was because he was admitted to [facility] between 223 and 01/08/2016 for sepsis, UTI and  pneumonia involving the left lung. he was treated with IV vancomycin and Zosyn and then meropenem. Was discharged home on oral Bactrim for 2 weeks none of his vascular test or x-rays were done and we will reorder these. 01/26/2016 -- he has not yet done the x-ray of his foot and is vascular tests are still pending. I have asked him to work on these with his nursing home staff. Addendum: he has got an x-ray of the right foot done which shows that his osteopenia but no specific ostial lysis or abnormal periosteal reaction. Final impression was degenerative changes with dorsal foot soft tissue swelling. 02/16/2016 -- lower extremity arterial duplex examination shows a 50-99% stenosis of the right tibioperoneal trunk. He had biphasic flow in the right SFA, popliteal and tibioperoneal trunk. Left-sided he had triphasic flow throughout 02/29/2016 -- he is awaiting his vascular opinion with Dr. Gilda Crease which is to be done on April 24 03/08/2016 -- he has not kept his appointment with Dr. Gilda Crease on April 24 and does not seem to know what happened about this. it's difficult to gauge whether he is in full control of his mental faculties as at times he is extremely rude to the nursing staff. 03/22/2016 -- the patient was seen by Dr. 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 Recommended that the patient had severe atherosclerotic changes of both lower extremities associated with ulceration and tissue loss of the foot. This is a limb threatening ischemia and the patient was recommended to undergo Hornback, Orie V. ([id number]) angiography of the lower extremity with a hope for intervention for limb salvage. Patient agreed and will proceed to angiography. He was admitted to the hospital between May 2 and 03/15/2016 - he underwent induction of a catheter into his right lower extremity and  third order catheter placement with contrast injection to the right lower extremity for distal runoff. Percutaneous transluminal angioplasty of the right superficial femoral and popliteal arteries were done. He also had a right peroneal angioplasty. Patient had a postoperative hematoma and was admitted for observation and in the next 24 hours he was very agitated and combative and had to be seen by psychiatric. He had a follow-up with Dr. Gilda Crease in 3 weeks. 04/18/2016 -- notes reviewed  from the vascular office where he was seen for his postop visit after the PTA of the right SFA and popliteal arteries on 03/12/2016. He underwent an ABI which showed right ABI to be more than 1.3 and left to be more than 1 more than 1.3, great toe and PPG waveforms are decreased bilaterally. No additional intervention indicated at this time and the patient was to follow-up in 3 months with an ABI and bilateral lower extremity duplex study. 05/02/2016 -- he complains that his compression stockings are causing him a lot of discomfort and he is not happy wearing them. 05/30/2016 -- the swelling of both legs has increased again and he has had blisters which have opened out into ulcerations. Electronic Signature(s) Signed: 06/06/2016 3:06:06 PM By: Evlyn Kanner MD, FACS Entered By: Evlyn Kanner on 06/06/2016 15:06:06 [name] ([id number]) -------------------------------------------------------------------------------- Physical Exam Details Patient Name: [name] Date of Service: 06/06/2016 2:15 PM Medical Record Number: [id number] Patient Account Number: [id number] Date of Birth/Sex: [birth year] (71 y.o. Male) Treating RN: Phillis Haggis Primary Care Physician: Oretha Milch Other Clinician: Referring Physician: Oretha Milch Treating Physician/Extender: Rudene Re in Treatment: 22 Constitutional . Pulse regular. Respirations normal and unlabored. Afebrile. . Eyes Nonicteric. Reactive  to light. Ears, Nose, Mouth, and Throat Lips, teeth, and gums WNL.Marland Kitchen Moist mucosa without lesions. Neck supple and nontender. No palpable supraclavicular or cervical adenopathy. Normal sized without goiter. Respiratory WNL. No retractions.. Cardiovascular Pedal Pulses WNL. No clubbing, cyanosis or edema. Lymphatic No adneopathy. No adenopathy. No adenopathy. Musculoskeletal Adexa without tenderness or enlargement.. Digits and nails w/o clubbing, cyanosis, infection, petechiae, ischemia, or inflammatory conditions.. Integumentary (Hair, Skin) No suspicious lesions. No crepitus or fluctuance. No peri-wound warmth or erythema. No masses.Marland Kitchen Psychiatric Judgement and insight Intact.. No evidence of depression, anxiety, or agitation.. Notes the right lower extremity looks very good today and there is no open ulcerations on the eschar was removed. The right calcaneum was sharply debrided and the subcutaneous tissue and at the base of it there is healthy granulation tissue. Electronic Signature(s) Signed: 06/06/2016 3:09:31 PM By: Evlyn Kanner MD, FACS Entered By: Evlyn Kanner on 06/06/2016 15:09:31 [name] ([id number]) -------------------------------------------------------------------------------- Physician Orders Details Patient Name: [name] Date of Service: 06/06/2016 2:15 PM Medical Record Number: [id number] Patient Account Number: [id number] Date of Birth/Sex: 12-10-1945 (70 y.o. Male) Treating RN: [name] Primary Care Physician: Oretha Milch Other Clinician: Referring Physician: Oretha Milch Treating Physician/Extender: Rudene Re in Treatment: 35 Verbal / Phone Orders: Yes Clinician: [name] Read Back and Verified: Yes Diagnosis Coding Wound Cleansing Wound #1 Left Lower Leg o No tub bath. o Other: - Please do sink or bed baths **do not get leg wraps wet** Wound #2 Right Lower Leg o No tub bath. o Other: - Please do sink or  bed baths **do not get leg wraps wet** Wound #3 Right Calcaneus o No tub bath. o Other: - Please do sink or bed baths **do not get leg wraps wet** Anesthetic Wound #1 Left Lower Leg o Topical Lidocaine 4% cream applied to wound bed prior to debridement - for clinic use only o Topical Lidocaine 4% cream applied to wound bed prior to debridement - for clinic use only Wound #2 Right Lower Leg o Topical Lidocaine 4% cream applied to wound bed prior to debridement - for clinic use only o Topical Lidocaine 4% cream applied to wound bed prior to debridement - for clinic use only Wound #3 Right Calcaneus o Topical Lidocaine  4% cream applied to wound bed prior to debridement - for clinic use only o Topical Lidocaine 4% cream applied to wound bed prior to debridement - for clinic use only Skin Barriers/Peri-Wound Care Wound #1 Left Lower Leg o Barrier cream - around the heel wound to reddened and irritated areas Wound #2 Right Lower Leg o Barrier cream - around the heel wound to reddened and irritated areas Wound #3 Right Calcaneus [name], [name] ([id number]) o Barrier cream - around the heel wound to reddened and irritated areas Primary Wound Dressing Wound #1 Left Lower Leg o Aquacel Ag - Please wet with saline before removing Wound #2 Right Lower Leg o Aquacel Ag - Please wet with saline before removing Wound #3 Right Calcaneus o Prisma Ag - moisten with normal saline Secondary Dressing Wound #1 Left Lower Leg o ABD pad Wound #2 Right Lower Leg o ABD pad Wound #3 Right Calcaneus o ABD pad o Dry Gauze o Conform/Kerlix Dressing Change Frequency Wound #1 Left Lower Leg o Change dressing every week - at the wound clinic Wound #2 Right Lower Leg o Change dressing every week - at the wound clinic Wound #3 Right Calcaneus o Change dressing every other day. - to be changed every other day by SN[facility] RN Follow-up Appointments Wound #1 Left  Lower Leg o Return Appointment in 1 week. Wound #2 Right Lower Leg o Return Appointment in 1 week. Wound #3 Right Calcaneus o Return Appointment in 1 week. Edema Control Wound #1 Left Lower Leg o 3 Layer Compression System - Bilateral Vaughan, Tiago V. ([id number]) o Elevate legs to the level of the heart and pump ankles as often as possible Wound #2 Right Lower Leg o 3 Layer Compression System - Bilateral o Elevate legs to the level of the heart and pump ankles as often as possible Off-Loading Wound #1 Left Lower Leg o Turn and reposition every 2 hours o Other: - sage boots at night and float heel when lying in bed Wound #2 Right Lower Leg o Turn and reposition every 2 hours o Other: - sage boots at night and float heel when lying in bed Wound #3 Right Calcaneus o Turn and reposition every 2 hours o Other: - sage boots at night and float heel when lying in bed Additional Orders / Instructions Wound #1 Left Lower Leg o Increase protein intake. Wound #2 Right Lower Leg o Increase protein intake. Wound #3 Right Calcaneus o Increase protein intake. Medications-please add to medication list. Wound #1 Left Lower Leg o Other: - Vitamin C, Vitamin A, Zinc, multivitamin Wound #2 Right Lower Leg o Other: - Vitamin C, Vitamin A, Zinc, multivitamin Wound #3 Right Calcaneus o Other: - Vitamin C, Vitamin A, Zinc, multivitamin Electronic Signature(s) Signed: 06/06/2016 4:12:41 PM By: Evlyn Kanner MD, FACS Signed: 06/06/2016 4:22:06 PM By: [name] Entered By: [name] on 06/06/2016 15:04:55 [name] ([id number]) [name], [name] Kitchen ([id number]) -------------------------------------------------------------------------------- Problem List Details Patient Name: [name] Date of Service: 06/06/2016 2:15 PM Medical Record Number: [id number] Patient Account Number: [id number] Date of Birth/Sex: Oct 20, 1946 (70 y.o.  Male) Treating RN: Phillis Haggis Primary Care Physician: Oretha Milch Other Clinician: Referring Physician: Oretha Milch Treating Physician/Extender: Rudene Re in Treatment: 22 Active Problems ICD-10 Encounter Code Description Active Date Diagnosis E11.621 Type 2 diabetes mellitus with foot ulcer 01/01/2016 Yes L89.613 Pressure ulcer of right heel, stage 3 01/01/2016 Yes E66.01 Morbid (severe) obesity due to excess calories 01/01/2016 Yes I89.0 Lymphedema, not elsewhere  classified 01/01/2016 Yes M70.871 Other soft tissue disorders related to use, overuse and 01/19/2016 Yes pressure, right ankle and foot 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/16/2016 Yes ulceration of heel and midfoot Inactive Problems Resolved Problems ICD-10 Code Description Active Date Resolved Date L89.322 Pressure ulcer of left buttock, stage 2 01/01/2016 01/01/2016 Electronic Signature(s) Signed: 06/06/2016 3:05:19 PM By: Evlyn Kanner MD, FACS Entered By: Evlyn Kanner on 06/06/2016 15:05:19 [name] ([id number]) [name], [name] Kitchen ([id number]) -------------------------------------------------------------------------------- Progress Note Details Patient Name: [name] Date of Service: 06/06/2016 2:15 PM Medical Record Number: [id number] Patient Account Number: [id number] Date of Birth/Sex: 1946/05/16 (70 y.o. Male) Treating RN: Phillis Haggis Primary Care Physician: Oretha Milch Other Clinician: Referring Physician: Oretha Milch Treating Physician/Extender: Rudene Re in Treatment: 22 Subjective Chief Complaint Information obtained from Patient Patient is at the clinic for treatment of an open pressure ulcer the left upper thigh and gluteal region and the right heel with bilateral swelling of the legs all of it which is going on for over a year History of Present Illness (HPI) The following HPI elements were documented for the patient's  wound: Location: ulcerated area on the right heel, left gluteal region and thigh and then bilateral lower extremities Quality: Patient reports experiencing a sharp pain to affected area(s). Severity: Patient states wound are getting worse. Duration: Patient has had the wound for > 12 months prior to seeking treatment at the wound center Timing: Pain in wound is constant (hurts all the time) Context: The wound appeared gradually over time Modifying Factors: Other treatment(s) tried include: he sees his heart doctor and his primary care doctor Associated Signs and Symptoms: patient has not been able to walk for over a year now 70 year old gentleman with a known history of hypertension, diabetes, obstructive sleep apnea, COPD, diastolic CHF, coronary artery disease was admitted to the hospital with sepsis from an ulcer of the right heel and was treated there in October 2016. he also has chronic bilateral lower extremity edema and lymphedema. He had received vancomycin, Zosyn and at that stage and x-rays showed hardware in the right ankle but no evidence of osteomyelitis. he was a former smoker. he was also treated with Augmentin orally for 10 days. He is either bedbound or wheelchair-bound and does not ambulate by himself. 01/19/2016 -- he has not been seen here for 3 weeks and this was because he was admitted to [facility] between 223 and 01/08/2016 for sepsis, UTI and pneumonia involving the left lung. he was treated with IV vancomycin and Zosyn and then meropenem. Was discharged home on oral Bactrim for 2 weeks none of his vascular test or x-rays were done and we will reorder these. 01/26/2016 -- he has not yet done the x-ray of his foot and is vascular tests are still pending. I have asked him to work on these with his nursing home staff. Addendum: he has got an x-ray of the right foot done which shows that his osteopenia but no specific ostial lysis or abnormal  periosteal reaction. Final impression was degenerative changes with dorsal foot soft tissue swelling. 02/16/2016 -- lower extremity arterial duplex examination shows a 50-99% stenosis of the right tibioperoneal trunk. He had biphasic flow in the right SFA, popliteal and tibioperoneal trunk. Left-sided he had triphasic flow throughout [name], [name] ([id number]) 02/29/2016 -- he is awaiting his vascular opinion with Dr. Gilda Crease which is to be done on April 24 03/08/2016 -- he has not kept  his appointment with Dr. Gilda Crease on April 24 and does not seem to know what happened about this. it's difficult to gauge whether he is in full control of his mental faculties as at times he is extremely rude to the nursing staff. 03/22/2016 -- the patient was seen by Dr. 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 Recommended that the patient had severe atherosclerotic changes of both lower extremities associated with ulceration and tissue loss of the foot. This is a limb threatening ischemia and the patient was recommended to undergo angiography of the lower extremity with a hope for intervention for limb salvage. Patient agreed and will proceed to angiography. He was admitted to the hospital between May 2 and 03/15/2016 - he underwent induction of a catheter into his right lower extremity and third order catheter placement with contrast injection to the right lower extremity for distal runoff. Percutaneous transluminal angioplasty of the right superficial femoral and popliteal arteries were done. He also had a right peroneal angioplasty. Patient had a postoperative hematoma and was admitted for observation and in the next 24 hours he was very agitated and combative and had to be seen by psychiatric. He had a follow-up with Dr. Gilda Crease in 3 weeks. 04/18/2016 -- notes reviewed from the vascular  office where he was seen for his postop visit after the PTA of the right SFA and popliteal arteries on 03/12/2016. He underwent an ABI which showed right ABI to be more than 1.3 and left to be more than 1 more than 1.3, great toe and PPG waveforms are decreased bilaterally. No additional intervention indicated at this time and the patient was to follow-up in 3 months with an ABI and bilateral lower extremity duplex study. 05/02/2016 -- he complains that his compression stockings are causing him a lot of discomfort and he is not happy wearing them. 05/30/2016 -- the swelling of both legs has increased again and he has had blisters which have opened out into ulcerations. Objective Constitutional Pulse regular. Respirations normal and unlabored. Afebrile. Vitals Time Taken: 2:46 PM, Height: 70 in, Weight: 235 lbs, BMI: 33.7, Temperature: 98.1 F, Pulse: 78 bpm, Respiratory Rate: 18 breaths/min, Blood Pressure: 137/71 mmHg. Eyes Nonicteric. Reactive to light. Ears, Nose, Mouth, and Throat Lips, teeth, and gums WNL.Marland Kitchen Moist mucosa without lesions. Neck Coviello, Auguste V. ([id number]) supple and nontender. No palpable supraclavicular or cervical adenopathy. Normal sized without goiter. Respiratory WNL. No retractions.. Cardiovascular Pedal Pulses WNL. No clubbing, cyanosis or edema. Lymphatic No adneopathy. No adenopathy. No adenopathy. Musculoskeletal Adexa without tenderness or enlargement.. Digits and nails w/o clubbing, cyanosis, infection, petechiae, ischemia, or inflammatory conditions.Marland Kitchen Psychiatric Judgement and insight Intact.. No evidence of depression, anxiety, or agitation.. General Notes: the right lower extremity looks very good today and there is no open ulcerations on the eschar was removed. The right calcaneum was sharply debrided and the subcutaneous tissue and at the base of it there is healthy granulation tissue. Integumentary (Hair, Skin) No suspicious lesions. No  crepitus or fluctuance. No peri-wound warmth or erythema. No masses.. Wound #1 status is Open. Original cause of wound was Gradually Appeared. The wound is located on the Left Lower Leg. The wound measures 0.5cm length x 1cm width x 0.1cm depth; 0.393cm^2 area and 0.039cm^3 volume. The wound is limited to skin breakdown. There is no tunneling or undermining noted. There is a large amount of serous drainage noted. The  wound margin is flat and intact. There is large (67- 100%) pink granulation within the wound bed. There is a small (1-33%) amount of necrotic tissue within the wound bed including Eschar. The periwound skin appearance exhibited: Localized Edema, Moist, Erythema. The surrounding wound skin color is noted with erythema which is circumferential. Periwound temperature was noted as No Abnormality. The periwound has tenderness on palpation. Wound #2 status is Open. Original cause of wound was Gradually Appeared. The wound is located on the Right Lower Leg. The wound measures 3.2cm length x 0.5cm width x 0.1cm depth; 1.257cm^2 area and 0.126cm^3 volume. The wound is limited to skin breakdown. There is no tunneling or undermining noted. There is a large amount of serosanguineous drainage noted. The wound margin is flat and intact. There is no granulation within the wound bed. There is a large (67-100%) amount of necrotic tissue within the wound bed including Eschar. The periwound skin appearance exhibited: Localized Edema, Moist, Erythema. The periwound skin appearance did not exhibit: Maceration. The surrounding wound skin color is noted with erythema which is circumferential. Periwound temperature was noted as No Abnormality. The periwound has tenderness on palpation. Wound #3 status is Open. Original cause of wound was Pressure Injury. The wound is located on the Right Calcaneus. The wound measures 1cm length x 1.2cm width x 0.2cm depth; 0.942cm^2 area and 0.188cm^3 volume. The wound is  limited to skin breakdown. There is no tunneling or undermining noted. There is a large amount of serous drainage noted. The wound margin is flat and intact. There is medium (34-66%) red, pink granulation within the wound bed. There is a medium (34-66%) amount of necrotic tissue within the wound bed including Adherent Slough. The periwound skin appearance exhibited: [name], [name] ([id number]) Localized Edema, Maceration, Moist, Erythema. The surrounding wound skin color is noted with erythema which is circumferential. Periwound temperature was noted as No Abnormality. The periwound has tenderness on palpation. Wound #6 status is Healed - Epithelialized. Original cause of wound was Pressure Injury. The wound is located on the Right,Lateral Foot. The wound measures 0cm length x 0cm width x 0cm depth; 0cm^2 area and 0cm^3 volume. The wound is limited to skin breakdown. There is no tunneling or undermining noted. There is a small amount of serosanguineous drainage noted. The wound margin is flat and intact. There is large (67-100%) pink granulation within the wound bed. There is no necrotic tissue within the wound bed. The periwound skin appearance exhibited: Moist. The periwound skin appearance did not exhibit: Callus, Crepitus, Excoriation, Fluctuance, Friable, Induration, Localized Edema, Rash, Scarring, Dry/Scaly, Maceration, Atrophie Blanche, Cyanosis, Ecchymosis, Hemosiderin Staining, Mottled, Pallor, Rubor, Erythema. Periwound temperature was noted as No Abnormality. The periwound has tenderness on palpation. 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 There was a Skin/Subcutaneous Tissue Debridement ([id number]) debridement with total area of 1.6 sq cm performed by Evlyn Kanner, MD. with the following instrument(s): Forceps to remove Viable and Non-Viable tissue/material including Fibrin/Slough, Eschar, and Subcutaneous after achieving pain control using Lidocaine 4% Topical Solution. A time out  was conducted prior to the start of the procedure. A Minimum amount of bleeding was controlled with Pressure. The procedure was tolerated well with a pain level of 0 throughout and a pain level of 0 following the procedure. Post Debridement Measurements: 3.2cm length x 0.5cm width x 0.1cm depth; 0.126cm^3 volume. Post procedure Diagnosis Wound #2: Same as Pre-Procedure Wound #3 [name], [name] ([id number]) Wound #3 is a Pressure Ulcer located on the Right Calcaneus . There was a Skin/Subcutaneous Tissue Debridement ([id number]) debridement with total area of 1.2 sq cm performed by Evlyn Kanner, MD. with the following instrument(s): Forceps to remove Viable and Non-Viable tissue/material including Fibrin/Slough, Eschar, Skin, and Subcutaneous after achieving pain control using Lidocaine 4% Topical Solution. A time out was conducted prior to the start of the procedure. A Minimum amount of bleeding was controlled with Pressure. The procedure was tolerated well with a pain level of 0 throughout and a pain level of 0 following the procedure. Post Debridement Measurements: 1cm length x 1.2cm width x 0.2cm depth; 0.188cm^3 volume. Post debridement Stage noted as Category/Stage III. Post procedure Diagnosis Wound #3: Same as Pre-Procedure Plan Wound Cleansing: Wound #1 Left Lower Leg: No tub bath. Other: - Please do sink or bed baths **do not get leg wraps wet** Wound #2 Right Lower Leg: No tub bath. Other: - Please  do sink or bed baths **do not get leg wraps wet** Wound #3 Right Calcaneus: No tub bath. Other: - Please do sink or bed baths **do not get leg wraps wet** Anesthetic: Wound #1 Left Lower Leg: Topical Lidocaine 4% cream applied to wound bed prior to debridement - for clinic use only Topical Lidocaine 4% cream applied to wound bed prior to debridement - for clinic use only Wound #2 Right Lower Leg: Topical Lidocaine 4% cream applied to wound bed prior to debridement - for clinic use only Topical Lidocaine 4% cream applied to wound bed prior to debridement - for clinic use only Wound #3 Right Calcaneus: Topical Lidocaine 4% cream applied to wound bed prior to debridement - for clinic use only Topical Lidocaine 4% cream applied to wound bed prior to debridement - for clinic use only Skin Barriers/Peri-Wound Care: Wound #1 Left Lower Leg: Barrier cream - around the heel wound to reddened and irritated areas Wound #2 Right Lower Leg: Barrier cream - around the heel wound to reddened and irritated areas Wound #3 Right Calcaneus: Barrier cream - around the heel wound to reddened and irritated areas Primary Wound Dressing: Wound #1 Left Lower Leg: Aquacel Ag - Please wet with saline before removing Wound #2 Right Lower Leg: Aquacel Ag - Please wet with saline before removing Wound #3 Right Calcaneus: [name], [name] ([id number]) Prisma Ag - moisten with normal saline Secondary Dressing: Wound #1 Left Lower Leg: ABD pad Wound #2 Right Lower Leg: ABD pad Wound #3 Right Calcaneus: ABD pad Dry Gauze Conform/Kerlix Dressing Change Frequency: Wound #1 Left Lower Leg: Change dressing every week - at the wound clinic Wound #2 Right Lower Leg: Change dressing every week - at the wound clinic Wound #3 Right Calcaneus: Change dressing every other day. - to be changed every other day by SN[facility] RN Follow-up Appointments: Wound #1 Left Lower Leg: Return Appointment in 1 week. Wound #2 Right  Lower Leg: Return Appointment in 1 week. Wound #3 Right Calcaneus: Return Appointment in 1 week. Edema Control: Wound #1 Left Lower Leg: 3 Layer Compression System - Bilateral Elevate legs to the level of the  heart and pump ankles as often as possible Wound #2 Right Lower Leg: 3 Layer Compression System - Bilateral Elevate legs to the level of the heart and pump ankles as often as possible Off-Loading: Wound #1 Left Lower Leg: Turn and reposition every 2 hours Other: - sage boots at night and float heel when lying in bed Wound #2 Right Lower Leg: Turn and reposition every 2 hours Other: - sage boots at night and float heel when lying in bed Wound #3 Right Calcaneus: Turn and reposition every 2 hours Other: - sage boots at night and float heel when lying in bed Additional Orders / Instructions: Wound #1 Left Lower Leg: Increase protein intake. Wound #2 Right Lower Leg: Increase protein intake. Wound #3 Right Calcaneus: Increase protein intake. Medications-please add to medication list.: Wound #1 Left Lower Leg: Other: - Vitamin C, Vitamin A, Zinc, multivitamin [name], [name] ([id number]) Wound #2 Right Lower Leg: Other: - Vitamin C, Vitamin A, Zinc, multivitamin Wound #3 Right Calcaneus: Other: - Vitamin C, Vitamin A, Zinc, multivitamin I have recommended: 1. Prisma AG to his right heel. The patient also has a wound on the right lateral foot. This will be treated with silver alginate 2. Constant off loading and also using Sage boot. 3. we will use Silver alginate and 3 layer Profore compressions for both lower extremities. 4. High-protein diet and multivitamins including vitamin C and zinc. Electronic Signature(s) Signed: 06/06/2016 3:10:11 PM By: Evlyn Kanner MD, FACS Entered By: Evlyn Kanner on 06/06/2016 15:10:11 [name] ([id number]) -------------------------------------------------------------------------------- SuperBill Details Patient Name: [name] Date of Service: 06/06/2016 Medical Record Number: [id number] Patient Account Number: [id number] Date of Birth/Sex: 09-Mar-1946 (70 y.o. 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 Facility Procedures CPT4 Code: [id number] Description: 11042 - DEB SUBQ TISSUE 20 SQ CM/< ICD-10 Description Diagnosis E11.621 Type 2 diabetes mellitus with foot ulcer L89.613 Pressure ulcer of right heel, stage 3 I89.0 Lymphedema, not elsewhere classified Modifier: Quantity: 1 Physician Procedures CPT4 Code: [id number] Description: 11042 - WC PHYS SUBQ TISS 20 SQ CM ICD-10 Description Diagnosis E11.621 Type 2 diabetes mellitus with foot ulcer L89.613 Pressure ulcer of right heel, stage 3 I89.0 Lymphedema, not elsewhere classified Modifier: Quantity: 1 Electronic Signature(s) Signed: 06/06/2016 3:10:42 PM By: Evlyn Kanner MD, FACS Previous Signature: 06/06/2016 3:10:23 PM Version By: Evlyn Kanner MD, FACS Entered By: Evlyn Kanner on 06/06/2016 15:10:41

## 2016-06-13 ENCOUNTER — Encounter: Payer: Medicare Other | Attending: Surgery | Admitting: Surgery

## 2016-06-13 DIAGNOSIS — I70234 Atherosclerosis of native arteries of right leg with ulceration of heel and midfoot: Secondary | ICD-10-CM | POA: Diagnosis not present

## 2016-06-13 DIAGNOSIS — E11621 Type 2 diabetes mellitus with foot ulcer: Secondary | ICD-10-CM | POA: Diagnosis not present

## 2016-06-13 DIAGNOSIS — I5032 Chronic diastolic (congestive) heart failure: Secondary | ICD-10-CM | POA: Insufficient documentation

## 2016-06-13 DIAGNOSIS — I2510 Atherosclerotic heart disease of native coronary artery without angina pectoris: Secondary | ICD-10-CM | POA: Insufficient documentation

## 2016-06-13 DIAGNOSIS — M70871 Other soft tissue disorders related to use, overuse and pressure, right ankle and foot: Secondary | ICD-10-CM | POA: Diagnosis not present

## 2016-06-13 DIAGNOSIS — J449 Chronic obstructive pulmonary disease, unspecified: Secondary | ICD-10-CM | POA: Insufficient documentation

## 2016-06-13 DIAGNOSIS — L89613 Pressure ulcer of right heel, stage 3: Secondary | ICD-10-CM | POA: Insufficient documentation

## 2016-06-13 DIAGNOSIS — G4733 Obstructive sleep apnea (adult) (pediatric): Secondary | ICD-10-CM | POA: Diagnosis not present

## 2016-06-13 DIAGNOSIS — I110 Hypertensive heart disease with heart failure: Secondary | ICD-10-CM | POA: Diagnosis not present

## 2016-06-13 DIAGNOSIS — Z6833 Body mass index (BMI) 33.0-33.9, adult: Secondary | ICD-10-CM | POA: Diagnosis not present

## 2016-06-13 DIAGNOSIS — Z87891 Personal history of nicotine dependence: Secondary | ICD-10-CM | POA: Insufficient documentation

## 2016-06-13 DIAGNOSIS — I890 Lymphedema, not elsewhere classified: Secondary | ICD-10-CM | POA: Insufficient documentation

## 2016-06-14 NOTE — Progress Notes (Signed)
AMYR, SLUDER ([id number]) Visit Report for 06/13/2016 Arrival Information Details Patient Name: [name], [name] Date of Service: 06/13/2016 9:30 AM Medical Record Number: [id number] Patient Account Number: [id number] Date of Birth/Sex: 03-16-1946 (70 y.o. Male) Treating RN: Phillis Haggis Primary Care Physician: Oretha Milch Other Clinician: Referring Physician: Oretha Milch Treating Physician/Extender: Rudene Re in Treatment: 23 Visit Information History Since Last Visit All ordered tests and consults were completed: No Patient Arrived: Wheel Chair Added or deleted any medications: No Arrival Time: 09:19 Any new allergies or adverse reactions: No Accompanied By: self Had a fall or experienced change in No Transfer Assistance: EasyPivot activities of daily living that may affect Patient Lift risk of falls: Patient Identification Verified: Yes Signs or symptoms of abuse/neglect since last No Secondary Verification Process Yes visito Completed: Hospitalized since last visit: No Patient Requires Transmission- No Pain Present Now: No Based Precautions: Patient Has Alerts: Yes Patient Alerts: DM II Electronic Signature(s) Signed: 06/13/2016 4:44:49 PM By: Alejandro Mulling Entered By: Alejandro Mulling on 06/13/2016 09:20:08 [name] ([id number]) -------------------------------------------------------------------------------- Encounter Discharge Information Details Patient Name: [name] Date of Service: 06/13/2016 9:30 AM Medical Record Number: [id number] Patient Account Number: [id number] Date of Birth/Sex: [birth year] (70 y.o. Male) Treating RN: Phillis Haggis Primary Care Physician: Oretha Milch Other Clinician: Referring Physician: Oretha Milch Treating Physician/Extender: Rudene Re in Treatment: 86 Encounter Discharge Information Items Discharge Pain Level: 0 Discharge Condition: Stable Ambulatory Status: Wheelchair Discharge  Destination: Nursing Home Transportation: Other Accompanied By: self Schedule Follow-up Appointment: Yes Medication Reconciliation completed Yes and provided to Patient/Care [name]: Provided on Clinical Summary of Care: 06/13/2016 Form Type Recipient Paper Patient EL Electronic Signature(s) Signed: 06/13/2016 10:18:57 AM By: Gwenlyn Perking Entered By: Gwenlyn Perking on 06/13/2016 10:18:57 [name] ([id number]) -------------------------------------------------------------------------------- Lower Extremity Assessment Details Patient Name: [name] Date of Service: 06/13/2016 9:30 AM Medical Record Number: [id number] Patient Account Number: [id number] Date of Birth/Sex: 11-27-1945 (70 y.o. Male) Treating RN: Phillis Haggis Primary Care Physician: Oretha Milch Other Clinician: Referring Physician: Oretha Milch Treating Physician/Extender: Rudene Re in Treatment: 23 Edema Assessment Assessed: [Left: No] [Right: No] E[Left: dema] [Right: :] Calf Left: Right: Point of Measurement: 34 cm From Medial Instep 38.5 cm 34.8 cm Ankle Left: Right: Point of Measurement: 12 cm From Medial Instep 25.4 cm 22.8 cm Vascular Assessment Pulses: Posterior Tibial Dorsalis Pedis Palpable: [Left:Yes] [Right:Yes] Extremity colors, hair growth, and conditions: Extremity Color: [Left:Hyperpigmented] [Right:Hyperpigmented] Temperature of Extremity: [Left:Warm] [Right:Warm] Capillary Refill: [Left:< 3 seconds] [Right:< 3 seconds] Electronic Signature(s) Signed: 06/13/2016 4:44:49 PM By: Alejandro Mulling Entered By: Alejandro Mulling on 06/13/2016 09:36:50 [name], [name] ([id number]) -------------------------------------------------------------------------------- Multi Wound Chart Details Patient Name: [name] Date of Service: 06/13/2016 9:30 AM Medical Record Number: [id number] Patient Account Number: [id number] Date of Birth/Sex: 06/19/1946 (70 y.o. Male) Treating RN:  Phillis Haggis Primary Care Physician: Oretha Milch Other Clinician: Referring Physician: Oretha Milch Treating Physician/Extender: Rudene Re in Treatment: 23 Vital Signs Height(in): 70 Pulse(bpm): 60 Weight(lbs): 235 Blood Pressure 105/61 (mmHg): Body Mass Index(BMI): 34 Temperature(F): 97.5 Respiratory Rate 18 (breaths/min): Photos: [1:No Photos] [2:No Photos] [3:No Photos] Wound Location: [1:Left Lower Leg] [2:Right Lower Leg] [3:Right Calcaneus] Wounding Event: [1:Gradually Appeared] [2:Gradually Appeared] [3:Pressure Injury] Primary Etiology: [1:Venous Leg Ulcer] [2:Venous Leg Ulcer] [3:Pressure Ulcer] Comorbid History: [1:Cataracts, Chronic Obstructive Pulmonary Disease (COPD), Sleep Disease (COPD), Sleep Disease (COPD), Sleep Apnea, Angina, Congestive Heart Failure, Congestive Heart Failure, Congestive Heart Failure, Hypertension, Type II Diabetes,  Gout, Osteoarthritis, Neuropathy Osteoarthritis,  Neuropathy Osteoarthritis, Neuropathy] [2:Cataracts, Chronic Obstructive Pulmonary Apnea, Angina, Hypertension, Type II Diabetes, Gout,] [3:Cataracts, Chronic Obstructive Pulmonary Apnea, Angina,  Hypertension, Type II Diabetes, Gout,] Date Acquired: [1:12/31/2014] [2:12/31/2014] [3:10/31/2015] Weeks of Treatment: [1:23] [2:23] [3:23] Wound Status: [1:Open] [2:Open] [3:Open] Measurements L x W x D 7x8.5x0.1 [2:3.2x0.5x0.1] [3:0.7x2x0.2] (cm) Area (cm) : [1:46.731] [2:1.257] [3:1.1] Volume (cm) : [1:4.673] [2:0.126] [3:0.22] % Reduction in Area: [1:-90.70%] [2:-432.60%] [3:84.40%] % Reduction in Volume: -90.70% [2:-425.00%] [3:84.40%] Classification: [1:Partial Thickness] [2:Partial Thickness] [3:Category/Stage III] HBO Classification: [1:Grade 1] [2:Grade 1] [3:Grade 1] Exudate Amount: [1:Large] [2:Large] [3:Large] Exudate Type: [1:Serous] [2:Serosanguineous] [3:Serous] Exudate Color: [1:amber] [2:red, brown] [3:amber] Wound Margin: [1:Flat and Intact] [2:Flat and  Intact] [3:Flat and Intact] Granulation Amount: [1:Large (67-100%)] [2:Large (67-100%)] [3:Medium (34-66%)] Granulation Quality: [1:Pink] [2:Pink] [3:Red, Pink] Necrotic Amount: [1:Small (1-33%)] [2:Small (1-33%)] [3:Medium (34-66%)] Necrotic Tissue: Eschar Adherent Slough Adherent Slough Exposed Structures: Fascia: No Fascia: No Fascia: No Fat: No Fat: No Fat: No Tendon: No Tendon: No Tendon: No Muscle: No Muscle: No Muscle: No Joint: No Joint: No Joint: No Bone: No Bone: No Bone: No Limited to Skin Limited to Skin Limited to Skin Breakdown Breakdown Breakdown Epithelialization: Medium (34-66%) Medium (34-66%) None Periwound Skin Texture: Edema: Yes Edema: Yes Edema: Yes Periwound Skin Moist: Yes Moist: Yes Maceration: Yes Moisture: Maceration: No Moist: Yes Periwound Skin Color: Erythema: Yes Erythema: Yes Erythema: Yes Erythema Location: Circumferential Circumferential Circumferential Temperature: No Abnormality No Abnormality No Abnormality Tenderness on Yes Yes Yes Palpation: Wound Preparation: Ulcer Cleansing: Other: Ulcer Cleansing: Other: Ulcer Cleansing: soap and water soap and water Rinsed/Irrigated with Saline Topical Anesthetic Topical Anesthetic Applied: None Applied: None Topical Anesthetic Applied: Other: lidocaine 4% Treatment Notes Electronic Signature(s) Signed: 06/13/2016 4:44:49 PM By: Alejandro Mulling Entered By: Alejandro Mulling on 06/13/2016 09:42:05 [name] ([id number]) -------------------------------------------------------------------------------- Multi-Disciplinary Care Plan Details Patient Name: [name] Date of Service: 06/13/2016 9:30 AM Medical Record Number: [id number] Patient Account Number: [id number] Date of Birth/Sex: 10-Jul-1946 (70 y.o. Male) Treating RN: Phillis Haggis Primary Care Physician: Oretha Milch Other Clinician: Referring Physician: Oretha Milch Treating Physician/Extender: Rudene Re  in Treatment: 59 Active Inactive Abuse / Safety / Falls / Self Care Management Nursing Diagnoses: Potential for falls Goals: Patient will remain injury free Date Initiated: 03/01/2016 Goal Status: Active Interventions: Assess fall risk on admission and as needed Notes: Nutrition Nursing Diagnoses: Imbalanced nutrition Goals: Patient/caregiver agrees to and verbalizes understanding of need to use nutritional supplements and/or vitamins as prescribed Date Initiated: 03/01/2016 Goal Status: Active Interventions: Assess patient nutrition upon admission and as needed per policy Notes: Orientation to the Wound Care Program Nursing Diagnoses: Knowledge deficit related to the wound healing center program Goals: Patient/caregiver will verbalize understanding of the Wound Healing Center [address] TAMARIUS, EDELSTEIN ([id number]) Date Initiated: 03/01/2016 Goal Status: Active Interventions: Provide education on orientation to the wound center Notes: Pain, Acute or Chronic Nursing Diagnoses: Pain, acute or chronic: actual or potential Potential alteration in comfort, pain Goals: Patient will verbalize adequate pain control and receive pain control interventions during procedures as needed Date Initiated: 03/01/2016 Goal Status: Active Patient/caregiver will verbalize adequate pain control between visits Date Initiated: 03/01/2016 Goal Status: Active Interventions: Assess comfort goal upon admission Complete pain assessment as per visit requirements Notes: Pressure Nursing Diagnoses: Knowledge deficit related to causes and risk factors for pressure ulcer development Knowledge deficit related to management of pressures ulcers Goals: Patient/caregiver will verbalize risk factors for pressure ulcer development Date Initiated: 03/01/2016 Goal Status: Active Interventions: Assess  offloading mechanisms upon admission and as needed Notes: Wound/Skin Impairment Nursing  Diagnoses: [name], [name] ([id number]) Impaired tissue integrity Goals: Ulcer/skin breakdown will have a volume reduction of 30% by week 4 Date Initiated: 03/01/2016 Goal Status: Active Ulcer/skin breakdown will have a volume reduction of 50% by week 8 Date Initiated: 03/01/2016 Goal Status: Active Ulcer/skin breakdown will have a volume reduction of 80% by week 12 Date Initiated: 03/01/2016 Goal Status: Active Interventions: Assess ulceration(s) every visit Notes: Electronic Signature(s) Signed: 06/13/2016 4:44:49 PM By: Alejandro Mulling Entered By: Alejandro Mulling on 06/13/2016 09:41:56 [name], [name] ([id number]) -------------------------------------------------------------------------------- Pain Assessment Details Patient Name: [name] Date of Service: 06/13/2016 9:30 AM Medical Record Number: [id number] Patient Account Number: [id number] Date of Birth/Sex: [birth year] (70 y.o. Male) Treating RN: Phillis Haggis Primary Care Physician: Oretha Milch Other Clinician: Referring Physician: Oretha Milch Treating Physician/Extender: Rudene Re in Treatment: 23 Active Problems Location of Pain Severity and Description of Pain Patient Has Paino No Site Locations With Dressing Change: No Pain Management and Medication Current Pain Management: Electronic Signature(s) Signed: 06/13/2016 4:44:49 PM By: Alejandro Mulling Entered By: Alejandro Mulling on 06/13/2016 09:20:14 [name] ([id number]) -------------------------------------------------------------------------------- Patient/Caregiver Education Details Patient Name: [name] Date of Service: 06/13/2016 9:30 AM Medical Record Number: [id number] Patient Account Number: [id number] Date of Birth/Gender: Sep 30, 1946 (70 y.o. Male) Treating RN: Phillis Haggis Primary Care Physician: Oretha Milch Other Clinician: Referring Physician: Oretha Milch Treating Physician/Extender: Rudene Re in  Treatment: 62 Education Assessment Education Provided To: Patient Education Topics Provided Wound/Skin Impairment: Handouts: Other: change heel dressing as ordered, do not get wraps wet Methods: Demonstration, Explain/Verbal Responses: State content correctly Electronic Signature(s) Signed: 06/13/2016 4:44:49 PM By: Alejandro Mulling Entered By: Alejandro Mulling on 06/13/2016 10:16:13 [name], [name] ([id number]) -------------------------------------------------------------------------------- Wound Assessment Details Patient Name: [name] Date of Service: 06/13/2016 9:30 AM Medical Record Number: [id number] Patient Account Number: [id number] Date of Birth/Sex: 1946/09/14 (70 y.o. Male) Treating RN: Phillis Haggis Primary Care Physician: Oretha Milch Other Clinician: Referring Physician: Oretha Milch Treating Physician/Extender: Rudene Re in Treatment: 23 Wound Status Wound Number: 1 Primary Venous Leg Ulcer Etiology: Wound Location: Left Lower Leg Wound Open Wounding Event: Gradually Appeared Status: Date Acquired: 12/31/2014 Comorbid Cataracts, Chronic Obstructive Weeks Of Treatment: 23 History: Pulmonary Disease (COPD), Sleep Clustered Wound: No Apnea, Angina, Congestive Heart Failure, Hypertension, Type II Diabetes, Gout, Osteoarthritis, Neuropathy Photos Photo Uploaded By: Alejandro Mulling on 06/13/2016 13:00:41 Wound Measurements Length: (cm) 7 Width: (cm) 8.5 Depth: (cm) 0.1 Area: (cm) 46.731 Volume: (cm) 4.673 % Reduction in Area: -90.7% % Reduction in Volume: -90.7% Epithelialization: Medium (34-66%) Tunneling: No Undermining: No Wound Description Classification: Partial Thickness Foul Odor Af Diabetic Severity (Wagner): Grade 1 Wound Margin: Flat and Intact Exudate Amount: Large Exudate Type: Serous Exudate Color: amber ter Cleansing: No Wound Bed Granulation Amount: Large (67-100%) Exposed Structure [name], [name]  ([id number]) Granulation Quality: Pink Fascia Exposed: No Necrotic Amount: Small (1-33%) Fat Layer Exposed: No Necrotic Quality: Eschar Tendon Exposed: No Muscle Exposed: No Joint Exposed: No Bone Exposed: No Limited to Skin Breakdown Periwound Skin Texture Texture Color No Abnormalities Noted: No No Abnormalities Noted: No Localized Edema: Yes Erythema: Yes Erythema Location: Circumferential Moisture No Abnormalities Noted: No Temperature / Pain Moist: Yes Temperature: No Abnormality Tenderness on Palpation: Yes Wound Preparation Ulcer Cleansing: Other: soap and water, Topical Anesthetic Applied: None Treatment Notes Wound #1 (Left Lower Leg) 1. Cleansed with: Cleanse wound with antibacterial soap and water 4. Dressing Applied:  Aquacel Ag 5. Secondary Dressing Applied ABD Pad 7. Secured with Tape 3 Layer Compression System - Bilateral Electronic Signature(s) Signed: 06/13/2016 4:44:49 PM By: Alejandro Mulling Entered By: Alejandro Mulling on 06/13/2016 09:40:40 [name], [name] ([id number]) -------------------------------------------------------------------------------- Wound Assessment Details Patient Name: [name] Date of Service: 06/13/2016 9:30 AM Medical Record Number: [id number] Patient Account Number: [id number] Date of Birth/Sex: [birth year] (70 y.o. Male) Treating RN: Phillis Haggis Primary Care Physician: Oretha Milch Other Clinician: Referring Physician: Oretha Milch Treating Physician/Extender: Rudene Re in Treatment: 23 Wound Status Wound Number: 2 Primary Venous Leg Ulcer Etiology: Wound Location: Right Lower Leg Wound Open Wounding Event: Gradually Appeared Status: Date Acquired: 12/31/2014 Comorbid Cataracts, Chronic Obstructive Weeks Of Treatment: 23 History: Pulmonary Disease (COPD), Sleep Clustered Wound: No Apnea, Angina, Congestive Heart Failure, Hypertension, Type II Diabetes, Gout, Osteoarthritis,  Neuropathy Photos Photo Uploaded By: Alejandro Mulling on 06/13/2016 13:00:42 Wound Measurements Length: (cm) 3.2 Width: (cm) 0.5 Depth: (cm) 0.1 Area: (cm) 1.257 Volume: (cm) 0.126 % Reduction in Area: -432.6% % Reduction in Volume: -425% Epithelialization: Medium (34-66%) Tunneling: No Undermining: No Wound Description Classification: Partial Thickness Foul Odor A Diabetic Severity (Wagner): Grade 1 Wound Margin: Flat and Intact Exudate Amount: Large Exudate Type: Serosanguineous Exudate Color: red, brown fter Cleansing: No Wound Bed Granulation Amount: Large (67-100%) Exposed Structure [name], [name] ([id number]) Granulation Quality: Pink Fascia Exposed: No Necrotic Amount: Small (1-33%) Fat Layer Exposed: No Necrotic Quality: Adherent Slough Tendon Exposed: No Muscle Exposed: No Joint Exposed: No Bone Exposed: No Limited to Skin Breakdown Periwound Skin Texture Texture Color No Abnormalities Noted: No No Abnormalities Noted: No Localized Edema: Yes Erythema: Yes Erythema Location: Circumferential Moisture No Abnormalities Noted: No Temperature / Pain Maceration: No Temperature: No Abnormality Moist: Yes Tenderness on Palpation: Yes Wound Preparation Ulcer Cleansing: Other: soap and water, Topical Anesthetic Applied: None Treatment Notes Wound #2 (Right Lower Leg) 1. Cleansed with: Cleanse wound with antibacterial soap and water 4. Dressing Applied: Aquacel Ag 5. Secondary Dressing Applied ABD Pad 7. Secured with Tape 3 Layer Compression System - Bilateral Electronic Signature(s) Signed: 06/13/2016 4:44:49 PM By: Alejandro Mulling Entered By: Alejandro Mulling on 06/13/2016 09:41:17 [name], [name] ([id number]) -------------------------------------------------------------------------------- Wound Assessment Details Patient Name: [name] Date of Service: 06/13/2016 9:30 AM Medical Record Number: [id number] Patient Account Number:  [id number] Date of Birth/Sex: Mar 24, 1946 (70 y.o. Male) Treating RN: Phillis Haggis Primary Care Physician: Oretha Milch Other Clinician: Referring Physician: Oretha Milch Treating Physician/Extender: Rudene Re in Treatment: 23 Wound Status Wound Number: 3 Primary Pressure Ulcer Etiology: Wound Location: Right Calcaneus Wound Open Wounding Event: Pressure Injury Status: Date Acquired: 10/31/2015 Comorbid Cataracts, Chronic Obstructive Weeks Of Treatment: 23 History: Pulmonary Disease (COPD), Sleep Clustered Wound: No Apnea, Angina, Congestive Heart Failure, Hypertension, Type II Diabetes, Gout, Osteoarthritis, Neuropathy Photos Photo Uploaded By: Alejandro Mulling on 06/13/2016 13:01:09 Wound Measurements Length: (cm) 0.7 Width: (cm) 2 Depth: (cm) 0.2 Area: (cm) 1.1 Volume: (cm) 0.22 % Reduction in Area: 84.4% % Reduction in Volume: 84.4% Epithelialization: None Tunneling: No Undermining: No Wound Description Classification: Category/Stage III Foul Odor A Diabetic Severity (Wagner): Grade 1 Wound Margin: Flat and Intact Exudate Amount: Large Exudate Type: Serous Exudate Color: amber fter Cleansing: No Wound Bed Granulation Amount: Medium (34-66%) Exposed Structure [name], Helio V. ([id number]) Granulation Quality: Red, Pink Fascia Exposed: No Necrotic Amount: Medium (34-66%) Fat Layer Exposed: No Necrotic Quality: Adherent Slough Tendon Exposed: No Muscle Exposed: No Joint Exposed: No Bone Exposed: No Limited to Skin Breakdown Periwound  Skin Texture Texture Color No Abnormalities Noted: No No Abnormalities Noted: No Localized Edema: Yes Erythema: Yes Erythema Location: Circumferential Moisture No Abnormalities Noted: No Temperature / Pain Maceration: Yes Temperature: No Abnormality Moist: Yes Tenderness on Palpation: Yes Wound Preparation Ulcer Cleansing: Rinsed/Irrigated with Saline Topical Anesthetic Applied: Other: lidocaine  4%, Treatment Notes Wound #3 (Right Calcaneus) 1. Cleansed with: Cleanse wound with antibacterial soap and water 2. Anesthetic Topical Lidocaine 4% cream to wound bed prior to debridement 4. Dressing Applied: Prisma Ag 5. Secondary Dressing Applied ABD Pad Dry Gauze Kerlix/Conform 7. Secured with Secretary/administrator) Signed: 06/13/2016 4:44:49 PM By: Alejandro Mulling Entered By: Alejandro Mulling on 06/13/2016 09:41:46 [name] ([id number]) -------------------------------------------------------------------------------- Vitals Details Patient Name: [name] Date of Service: 06/13/2016 9:30 AM Medical Record Number: [id number] Patient Account Number: [id number] Date of Birth/Sex: 17-Apr-1946 (70 y.o. Male) Treating RN: Phillis Haggis Primary Care Physician: Oretha Milch Other Clinician: Referring Physician: Oretha Milch Treating Physician/Extender: Rudene Re in Treatment: 23 Vital Signs Time Taken: 08:21 Temperature (F): 97.5 Height (in): 70 Pulse (bpm): 60 Weight (lbs): 235 Respiratory Rate (breaths/min): 18 Body Mass Index (BMI): 33.7 Blood Pressure (mmHg): 105/61 Reference Range: 80 - 120 mg / dl Electronic Signature(s) Signed: 06/13/2016 4:44:49 PM By: Alejandro Mulling Entered By: Alejandro Mulling on 06/13/2016 09:23:06

## 2016-06-14 NOTE — Progress Notes (Signed)
BABE, CLENNEY ([id number]) Visit Report for 06/13/2016 Chief Complaint Document Details Patient Name: [name], [name] Date of Service: 06/13/2016 9:30 AM Medical Record Number: [id number] Patient Account Number: [id number] Date of Birth/Sex: [birth year] (70 y.o. Male) Treating RN: Phillis Haggis Primary Care Physician: Oretha Milch Other Clinician: Referring Physician: Oretha Milch Treating Physician/Extender: Rudene Re in Treatment: 56 Information Obtained from: Patient Chief Complaint Patient is at the clinic for treatment of an open pressure ulcer the left upper thigh and gluteal region and the right heel with bilateral swelling of the legs all of it which is going on for over a year Electronic Signature(s) Signed: 06/13/2016 9:52:56 AM By: Evlyn Kanner MD, FACS Entered By: Evlyn Kanner on 06/13/2016 09:52:55 [name] ([id number]) -------------------------------------------------------------------------------- Debridement Details Patient Name: [name] Date of Service: 06/13/2016 9:30 AM Medical Record Number: [id number] Patient Account Number: [id number] Date of Birth/Sex: [birth year] (70 y.o. Male) Treating RN: Phillis Haggis Primary Care Physician: Oretha Milch Other Clinician: Referring Physician: Oretha Milch Treating Physician/Extender: Rudene Re in Treatment: 23 Debridement Performed for Wound #3 Right Calcaneus Assessment: Performed By: Physician Evlyn Kanner, MD Debridement: Debridement Pre-procedure Yes Verification/Time Out Taken: Start Time: 09:44 Pain Control: Lidocaine 4% Topical Solution Level: Skin/Subcutaneous Tissue Total Area Debrided (L x 0.7 (cm) x 2 (cm) = 1.4 (cm) W): Tissue and other Non-Viable, Fibrin/Slough, Subcutaneous material debrided: Instrument: Curette Bleeding: Minimum Hemostasis Achieved: Pressure End Time: 09:49 Procedural Pain: 0 Post Procedural Pain: 0 Response to Treatment: Procedure was  tolerated well Post Debridement Measurements of Total Wound Length: (cm) 0.7 Stage: Category/Stage III Width: (cm) 2 Depth: (cm) 0.2 Volume: (cm) 0.22 Post Procedure Diagnosis Same as Pre-procedure Electronic Signature(s) Signed: 06/13/2016 9:52:46 AM By: Evlyn Kanner MD, FACS Signed: 06/13/2016 4:44:49 PM By: Alejandro Mulling Entered By: Evlyn Kanner on 06/13/2016 09:52:45 Wyrick, Kalani VMarland Kitchen ([id number]) -------------------------------------------------------------------------------- HPI Details Patient Name: [name] Date of Service: 06/13/2016 9:30 AM Medical Record Number: [id number] Patient Account Number: [id number] Date of Birth/Sex: 17-May-1946 (70 y.o. Male) Treating RN: Phillis Haggis Primary Care Physician: Oretha Milch Other Clinician: Referring Physician: Oretha Milch Treating Physician/Extender: Rudene Re in Treatment: 23 History of Present Illness Location: ulcerated area on the right heel, left gluteal region and thigh and then bilateral lower extremities Quality: Patient reports experiencing a sharp pain to affected area(s). Severity: Patient states wound are getting worse. Duration: Patient has had the wound for > 12 months prior to seeking treatment at the wound center Timing: Pain in wound is constant (hurts all the time) Context: The wound appeared gradually over time Modifying Factors: Other treatment(s) tried include: he sees his heart doctor and his primary care doctor Associated Signs and Symptoms: patient has not been able to walk for over a year now HPI Description: 70 year old gentleman with a known history of hypertension, diabetes, obstructive sleep apnea, COPD, diastolic CHF, coronary artery disease was admitted to the hospital with sepsis from an ulcer of the right heel and was treated there in October 2016. he also has chronic bilateral lower extremity edema and lymphedema. He had received vancomycin, Zosyn and at that stage and x-rays  showed hardware in the right ankle but no evidence of osteomyelitis. he was a former smoker. he was also treated with Augmentin orally for 10 days. He is either bedbound or wheelchair-bound and does not ambulate by himself. 01/19/2016 -- he has not been seen here for 3 weeks and this was because he was admitted to [facility] between 223  and 01/08/2016 for sepsis, UTI and pneumonia involving the left lung. he was treated with IV vancomycin and Zosyn and then meropenem. Was discharged home on oral Bactrim for 2 weeks none of his vascular test or x-rays were done and we will reorder these. 01/26/2016 -- he has not yet done the x-ray of his foot and is vascular tests are still pending. I have asked him to work on these with his nursing home staff. Addendum: he has got an x-ray of the right foot done which shows that his osteopenia but no specific ostial lysis or abnormal periosteal reaction. Final impression was degenerative changes with dorsal foot soft tissue swelling. 02/16/2016 -- lower extremity arterial duplex examination shows a 50-99% stenosis of the right tibioperoneal trunk. He had biphasic flow in the right SFA, popliteal and tibioperoneal trunk. Left-sided he had triphasic flow throughout 02/29/2016 -- he is awaiting his vascular opinion with Dr. Gilda Crease which is to be done on April 24 03/08/2016 -- he has not kept his appointment with Dr. Gilda Crease on April 24 and does not seem to know what happened about this. it's difficult to gauge whether he is in full control of his mental faculties as at times he is extremely rude to the nursing staff. 03/22/2016 -- the patient was seen by Dr. 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 Recommended that the patient had severe atherosclerotic changes of both lower extremities associated with ulceration and tissue  loss of the foot. This is a limb threatening ischemia and the patient was recommended to undergo Wire, Jireh V. ([id number]) angiography of the lower extremity with a hope for intervention for limb salvage. Patient agreed and will proceed to angiography. He was admitted to the hospital between May 2 and 03/15/2016 - he underwent induction of a catheter into his right lower extremity and third order catheter placement with contrast injection to the right lower extremity for distal runoff. Percutaneous transluminal angioplasty of the right superficial femoral and popliteal arteries were done. He also had a right peroneal angioplasty. Patient had a postoperative hematoma and was admitted for observation and in the next 24 hours he was very agitated and combative and had to be seen by psychiatric. He had a follow-up with Dr. Gilda Crease in 3 weeks. 04/18/2016 -- notes reviewed from the vascular office where he was seen for his postop visit after the PTA of the right SFA and popliteal arteries on 03/12/2016. He underwent an ABI which showed right ABI to be more than 1.3 and left to be more than 1 more than 1.3, great toe and PPG waveforms are decreased bilaterally. No additional intervention indicated at this time and the patient was to follow-up in 3 months with an ABI and bilateral lower extremity duplex study. 05/02/2016 -- he complains that his compression stockings are causing him a lot of discomfort and he is not happy wearing them. 05/30/2016 -- the swelling of both legs has increased again and he has had blisters which have opened out into ulcerations. Electronic Signature(s) Signed: 06/13/2016 9:53:04 AM By: Evlyn Kanner MD, FACS Entered By: Evlyn Kanner on 06/13/2016 09:53:04 [name] ([id number]) -------------------------------------------------------------------------------- Physical Exam Details Patient Name: [name] Date of Service: 06/13/2016 9:30 AM Medical Record  Number: [id number] Patient Account Number: [id number] Date of Birth/Sex: Nov 09, 1946 (70 y.o. Male) Treating RN: Phillis Haggis Primary Care Physician: Oretha Milch Other Clinician: Referring Physician: Oretha Milch  Treating Physician/Extender: Rudene Re in Treatment: 23 Constitutional . Pulse regular. Respirations normal and unlabored. Afebrile. . Eyes Nonicteric. Reactive to light. Ears, Nose, Mouth, and Throat Lips, teeth, and gums WNL.Marland Kitchen Moist mucosa without lesions. Neck supple and nontender. No palpable supraclavicular or cervical adenopathy. Normal sized without goiter. Respiratory WNL. No retractions.. Cardiovascular Pedal Pulses WNL. No clubbing, cyanosis or edema. Lymphatic No adneopathy. No adenopathy. No adenopathy. Musculoskeletal Adexa without tenderness or enlargement.. Digits and nails w/o clubbing, cyanosis, infection, petechiae, ischemia, or inflammatory conditions.. Integumentary (Hair, Skin) No suspicious lesions. No crepitus or fluctuance. No peri-wound warmth or erythema. No masses.Marland Kitchen Psychiatric Judgement and insight Intact.. No evidence of depression, anxiety, or agitation.. Notes both the lower extremities edema looks much better and though superficial ulcerations are healing well. The right heel required some sharp debridement with a curette but once this was done the base is very clean and has healthy granulation tissue. The lateral foot wound is healed. Electronic Signature(s) Signed: 06/13/2016 9:54:36 AM By: Evlyn Kanner MD, FACS Entered By: Evlyn Kanner on 06/13/2016 09:54:36 [name] ([id number]) -------------------------------------------------------------------------------- Physician Orders Details Patient Name: [name] Date of Service: 06/13/2016 9:30 AM Medical Record Number: [id number] Patient Account Number: [id number] Date of Birth/Sex: Sep 11, 1946 (70 y.o. Male) Treating RN: [name], RN, [name], Elim Sink Primary Care  Physician: Oretha Milch Other Clinician: Referring Physician: Oretha Milch Treating Physician/Extender: Rudene Re in Treatment: 60 Verbal / Phone Orders: Yes Clinician: [name], RN, [name], Rita Read Back and Verified: Yes Diagnosis Coding Wound Cleansing Wound #1 Left Lower Leg o No tub bath. o Other: - Please do sink or bed baths **do not get leg wraps wet** Wound #2 Right Lower Leg o No tub bath. o Other: - Please do sink or bed baths **do not get leg wraps wet** Wound #3 Right Calcaneus o No tub bath. o Other: - Please do sink or bed baths **do not get leg wraps wet** Anesthetic Wound #1 Left Lower Leg o Topical Lidocaine 4% cream applied to wound bed prior to debridement - for clinic use only o Topical Lidocaine 4% cream applied to wound bed prior to debridement - for clinic use only Wound #2 Right Lower Leg o Topical Lidocaine 4% cream applied to wound bed prior to debridement - for clinic use only o Topical Lidocaine 4% cream applied to wound bed prior to debridement - for clinic use only Wound #3 Right Calcaneus o Topical Lidocaine 4% cream applied to wound bed prior to debridement - for clinic use only o Topical Lidocaine 4% cream applied to wound bed prior to debridement - for clinic use only Skin Barriers/Peri-Wound Care Wound #1 Left Lower Leg o Barrier cream - around the heel wound to reddened and irritated areas Wound #2 Right Lower Leg o Barrier cream - around the heel wound to reddened and irritated areas Wound #3 Right Calcaneus Kunka, Jakhari V. ([id number]) o Barrier cream - around the heel wound to reddened and irritated areas Primary Wound Dressing Wound #1 Left Lower Leg o Aquacel Ag - Please wet with saline before removing Wound #2 Right Lower Leg o Aquacel Ag - Please wet with saline before removing Wound #3 Right Calcaneus o Prisma Ag - moisten with normal saline Secondary Dressing Wound #1 Left Lower  Leg o ABD pad Wound #2 Right Lower Leg o ABD pad Wound #3 Right Calcaneus o ABD pad o Dry Gauze o Conform/Kerlix Dressing Change Frequency Wound #1 Left Lower Leg o Change dressing every week -  at the wound clinic Wound #2 Right Lower Leg o Change dressing every week - at the wound clinic Wound #3 Right Calcaneus o Change dressing every other day. - to be changed every other day by SNF RN Follow-up Appointments Wound #1 Left Lower Leg o Return Appointment in 1 week. Wound #2 Right Lower Leg o Return Appointment in 1 week. Wound #3 Right Calcaneus o Return Appointment in 1 week. Edema Control Wound #1 Left Lower Leg o 3 Layer Compression System - Bilateral Olano, Aroldo V. ([id number]) o Elevate legs to the level of the heart and pump ankles as often as possible Wound #2 Right Lower Leg o 3 Layer Compression System - Bilateral o Elevate legs to the level of the heart and pump ankles as often as possible Off-Loading Wound #1 Left Lower Leg o Turn and reposition every 2 hours o Other: - sage boots at night and float heel when lying in bed Wound #2 Right Lower Leg o Turn and reposition every 2 hours o Other: - sage boots at night and float heel when lying in bed Wound #3 Right Calcaneus o Turn and reposition every 2 hours o Other: - sage boots at night and float heel when lying in bed Additional Orders / Instructions Wound #1 Left Lower Leg o Increase protein intake. Wound #2 Right Lower Leg o Increase protein intake. Wound #3 Right Calcaneus o Increase protein intake. Medications-please add to medication list. Wound #1 Left Lower Leg o Other: - Vitamin C, Vitamin A, Zinc, multivitamin Wound #2 Right Lower Leg o Other: - Vitamin C, Vitamin A, Zinc, multivitamin Wound #3 Right Calcaneus o Other: - Vitamin C, Vitamin A, Zinc, multivitamin Electronic Signature(s) Signed: 06/13/2016 4:00:53 PM By: Elpidio Eric [name],  RN Signed: 06/13/2016 4:34:24 PM By: Evlyn Kanner MD, FACS Entered By: Elpidio Eric on 06/13/2016 09:51:04 [name] ([id number]) [name], Munachimso VMarland Kitchen ([id number]) -------------------------------------------------------------------------------- Problem List Details Patient Name: [name] Date of Service: 06/13/2016 9:30 AM Medical Record Number: [id number] Patient Account Number: [id number] Date of Birth/Sex: 07/07/1946 (70 y.o. Male) Treating RN: Phillis Haggis Primary Care Physician: Oretha Milch Other Clinician: Referring Physician: Oretha Milch Treating Physician/Extender: Rudene Re in Treatment: 23 Active Problems ICD-10 Encounter Code Description Active Date Diagnosis E11.621 Type 2 diabetes mellitus with foot ulcer 01/01/2016 Yes L89.613 Pressure ulcer of right heel, stage 3 01/01/2016 Yes E66.01 Morbid (severe) obesity due to excess calories 01/01/2016 Yes I89.0 Lymphedema, not elsewhere classified 01/01/2016 Yes M70.871 Other soft tissue disorders related to use, overuse and 01/19/2016 Yes pressure, right ankle and foot 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/16/2016 Yes ulceration of heel and midfoot Inactive Problems Resolved Problems ICD-10 Code Description Active Date Resolved Date L89.322 Pressure ulcer of left buttock, stage 2 01/01/2016 01/01/2016 Electronic Signature(s) Signed: 06/13/2016 9:52:09 AM By: Evlyn Kanner MD, FACS Entered By: Evlyn Kanner on 06/13/2016 09:52:08 [name] ([id number]) [name], Owen VMarland Kitchen ([id number]) -------------------------------------------------------------------------------- Progress Note Details Patient Name: [name] Date of Service: 06/13/2016 9:30 AM Medical Record Number: [id number] Patient Account Number: [id number] Date of Birth/Sex: Apr 04, 1946 (70 y.o. Male) Treating RN: Phillis Haggis Primary Care Physician: Oretha Milch Other Clinician: Referring Physician: Oretha Milch Treating Physician/Extender: Rudene Re in Treatment: 32 Subjective Chief Complaint Information obtained from Patient Patient is at the clinic for treatment of an open pressure ulcer the left upper thigh and gluteal region and the right heel with bilateral swelling of the legs all of it which is going  on for over a year History of Present Illness (HPI) The following HPI elements were documented for the patient's wound: Location: ulcerated area on the right heel, left gluteal region and thigh and then bilateral lower extremities Quality: Patient reports experiencing a sharp pain to affected area(s). Severity: Patient states wound are getting worse. Duration: Patient has had the wound for > 12 months prior to seeking treatment at the wound center Timing: Pain in wound is constant (hurts all the time) Context: The wound appeared gradually over time Modifying Factors: Other treatment(s) tried include: he sees his heart doctor and his primary care doctor Associated Signs and Symptoms: patient has not been able to walk for over a year now 70 year old gentleman with a known history of hypertension, diabetes, obstructive sleep apnea, COPD, diastolic CHF, coronary artery disease was admitted to the hospital with sepsis from an ulcer of the right heel and was treated there in October 2016. he also has chronic bilateral lower extremity edema and lymphedema. He had received vancomycin, Zosyn and at that stage and x-rays showed hardware in the right ankle but no evidence of osteomyelitis. he was a former smoker. he was also treated with Augmentin orally for 10 days. He is either bedbound or wheelchair-bound and does not ambulate by himself. 01/19/2016 -- he has not been seen here for 3 weeks and this was because he was admitted to [facility] between 223 and 01/08/2016 for sepsis, UTI and pneumonia involving the left lung. he was treated with IV vancomycin and Zosyn  and then meropenem. Was discharged home on oral Bactrim for 2 weeks none of his vascular test or x-rays were done and we will reorder these. 01/26/2016 -- he has not yet done the x-ray of his foot and is vascular tests are still pending. I have asked him to work on these with his nursing home staff. Addendum: he has got an x-ray of the right foot done which shows that his osteopenia but no specific ostial lysis or abnormal periosteal reaction. Final impression was degenerative changes with dorsal foot soft tissue swelling. 02/16/2016 -- lower extremity arterial duplex examination shows a 50-99% stenosis of the right tibioperoneal trunk. He had biphasic flow in the right SFA, popliteal and tibioperoneal trunk. Left-sided he had triphasic flow throughout MORRY, VEIGA ([id number]) 02/29/2016 -- he is awaiting his vascular opinion with Dr. Gilda Crease which is to be done on April 24 03/08/2016 -- he has not kept his appointment with Dr. Gilda Crease on April 24 and does not seem to know what happened about this. it's difficult to gauge whether he is in full control of his mental faculties as at times he is extremely rude to the nursing staff. 03/22/2016 -- the patient was seen by Dr. 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 Recommended that the patient had severe atherosclerotic changes of both lower extremities associated with ulceration and tissue loss of the foot. This is a limb threatening ischemia and the patient was recommended to undergo angiography of the lower extremity with a hope for intervention for limb salvage. Patient agreed and will proceed to angiography. He was admitted to the hospital between May 2 and 03/15/2016 - he underwent induction of a catheter into his right lower extremity and third order catheter placement with contrast injection to the right lower extremity  for distal runoff. Percutaneous transluminal angioplasty of the right superficial femoral and  popliteal arteries were done. He also had a right peroneal angioplasty. Patient had a postoperative hematoma and was admitted for observation and in the next 24 hours he was very agitated and combative and had to be seen by psychiatric. He had a follow-up with Dr. Gilda Crease in 3 weeks. 04/18/2016 -- notes reviewed from the vascular office where he was seen for his postop visit after the PTA of the right SFA and popliteal arteries on 03/12/2016. He underwent an ABI which showed right ABI to be more than 1.3 and left to be more than 1 more than 1.3, great toe and PPG waveforms are decreased bilaterally. No additional intervention indicated at this time and the patient was to follow-up in 3 months with an ABI and bilateral lower extremity duplex study. 05/02/2016 -- he complains that his compression stockings are causing him a lot of discomfort and he is not happy wearing them. 05/30/2016 -- the swelling of both legs has increased again and he has had blisters which have opened out into ulcerations. Objective Constitutional Pulse regular. Respirations normal and unlabored. Afebrile. Vitals Time Taken: 8:21 AM, Height: 70 in, Weight: 235 lbs, BMI: 33.7, Temperature: 97.5 F, Pulse: 60 bpm, Respiratory Rate: 18 breaths/min, Blood Pressure: 105/61 mmHg. Eyes Nonicteric. Reactive to light. Ears, Nose, Mouth, and Throat Lips, teeth, and gums WNL.Marland Kitchen Moist mucosa without lesions. Neck Apostol, Alton V. ([id number]) supple and nontender. No palpable supraclavicular or cervical adenopathy. Normal sized without goiter. Respiratory WNL. No retractions.. Cardiovascular Pedal Pulses WNL. No clubbing, cyanosis or edema. Lymphatic No adneopathy. No adenopathy. No adenopathy. Musculoskeletal Adexa without tenderness or enlargement.. Digits and nails w/o clubbing, cyanosis, infection, petechiae, ischemia, or  inflammatory conditions.Marland Kitchen Psychiatric Judgement and insight Intact.. No evidence of depression, anxiety, or agitation.. General Notes: both the lower extremities edema looks much better and though superficial ulcerations are healing well. The right heel required some sharp debridement with a curette but once this was done the base is very clean and has healthy granulation tissue. The lateral foot wound is healed. Integumentary (Hair, Skin) No suspicious lesions. No crepitus or fluctuance. No peri-wound warmth or erythema. No masses.. Wound #1 status is Open. Original cause of wound was Gradually Appeared. The wound is located on the Left Lower Leg. The wound measures 7cm length x 8.5cm width x 0.1cm depth; 46.731cm^2 area and 4.673cm^3 volume. The wound is limited to skin breakdown. There is no tunneling or undermining noted. There is a large amount of serous drainage noted. The wound margin is flat and intact. There is large (67- 100%) pink granulation within the wound bed. There is a small (1-33%) amount of necrotic tissue within the wound bed including Eschar. The periwound skin appearance exhibited: Localized Edema, Moist, Erythema. The surrounding wound skin color is noted with erythema which is circumferential. Periwound temperature was noted as No Abnormality. The periwound has tenderness on palpation. Wound #2 status is Open. Original cause of wound was Gradually Appeared. The wound is located on the Right Lower Leg. The wound measures 3.2cm length x 0.5cm width x 0.1cm depth; 1.257cm^2 area and 0.126cm^3 volume. The wound is limited to skin breakdown. There is no tunneling or undermining noted. There is a large amount of serosanguineous drainage noted. The wound margin is flat and intact. There is large (67-100%) pink granulation within the wound bed. There is a small (1-33%) amount of necrotic tissue within the wound bed including Adherent Slough. The periwound skin appearance  exhibited: Localized Edema, Moist, Erythema. The periwound skin appearance  did not exhibit: Maceration. The surrounding wound skin color is noted with erythema which is circumferential. Periwound temperature was noted as No Abnormality. The periwound has tenderness on palpation. Wound #3 status is Open. Original cause of wound was Pressure Injury. The wound is located on the Right Calcaneus. The wound measures 0.7cm length x 2cm width x 0.2cm depth; 1.1cm^2 area and 0.22cm^3 volume. The wound is limited to skin breakdown. There is no tunneling or undermining noted. There is a large amount of serous drainage noted. The wound margin is flat and intact. There is medium (34-66%) red, pink granulation within the wound bed. There is a medium (34-66%) amount of necrotic tissue within the wound bed including Adherent Slough. The periwound skin appearance exhibited: Localized Edema, Mato, Anastasios V. ([id number]) Maceration, Moist, Erythema. The surrounding wound skin color is noted with erythema which is circumferential. Periwound temperature was noted as No Abnormality. The periwound has tenderness on palpation. 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 There was a Skin/Subcutaneous Tissue Debridement ([id number]) debridement with total area of 1.4 sq cm performed by Evlyn Kanner, MD. with the following instrument(s): Curette to remove Non-Viable tissue/material including Fibrin/Slough and Subcutaneous after achieving pain control using  Lidocaine 4% Topical Solution. A time out was conducted prior to the start of the procedure. A Minimum amount of bleeding was controlled with Pressure. The procedure was tolerated well with a pain level of 0 throughout and a pain level of 0 following the procedure. Post Debridement Measurements: 0.7cm length x 2cm width x 0.2cm depth; 0.22cm^3 volume. Post debridement Stage noted as Category/Stage III. Post procedure Diagnosis Wound #3: Same as Pre-Procedure Plan Wound Cleansing: Wound #1 Left Lower Leg: No tub bath. Other: - Please do sink or bed baths **do not get leg wraps wet** Wound #2 Right Lower Leg: Wyman, Jamerson V. ([id number]) No tub bath. Other: - Please do sink or bed baths **do not get leg wraps wet** Wound #3 Right Calcaneus: No tub bath. Other: - Please do sink or bed baths **do not get leg wraps wet** Anesthetic: Wound #1 Left Lower Leg: Topical Lidocaine 4% cream applied to wound bed prior to debridement - for clinic use only Topical Lidocaine 4% cream applied to wound bed prior to debridement - for clinic use only Wound #2 Right Lower Leg: Topical Lidocaine 4% cream applied to wound bed prior to debridement - for clinic use only Topical Lidocaine 4% cream applied to wound bed prior to debridement - for clinic use only Wound #3 Right Calcaneus: Topical Lidocaine 4% cream applied to wound bed prior to debridement - for clinic use only Topical Lidocaine 4% cream applied to wound bed prior to debridement - for clinic use only Skin Barriers/Peri-Wound Care: Wound #1 Left Lower Leg: Barrier cream - around the heel wound to reddened and irritated areas Wound #2 Right Lower Leg: Barrier cream - around the heel wound to reddened and irritated areas Wound #3 Right Calcaneus: Barrier cream - around the heel wound to reddened and irritated areas Primary Wound Dressing: Wound #1 Left Lower  Leg: Aquacel Ag - Please wet with saline before removing Wound #2 Right Lower  Leg: Aquacel Ag - Please wet with saline before removing Wound #3 Right Calcaneus: Prisma Ag - moisten with normal saline Secondary Dressing: Wound #1 Left Lower Leg: ABD pad Wound #2 Right Lower Leg: ABD pad Wound #3 Right Calcaneus: ABD pad Dry Gauze Conform/Kerlix Dressing Change Frequency: Wound #1 Left Lower Leg: Change dressing every week - at the wound clinic Wound #2 Right Lower Leg: Change dressing every week - at the wound clinic Wound #3 Right Calcaneus: Change dressing every other day. - to be changed every other day by [facility] RN Follow-up Appointments: Wound #1 Left Lower Leg: Return Appointment in 1 week. Wound #2 Right Lower Leg: Return Appointment in 1 week. Wound #3 Right Calcaneus: Helling, Freddi V. ([id number]) Return Appointment in 1 week. Edema Control: Wound #1 Left Lower Leg: 3 Layer Compression System - Bilateral Elevate legs to the level of the heart and pump ankles as often as possible Wound #2 Right Lower Leg: 3 Layer Compression System - Bilateral Elevate legs to the level of the heart and pump ankles as often as possible Off-Loading: Wound #1 Left Lower Leg: Turn and reposition every 2 hours Other: - sage boots at night and float heel when lying in bed Wound #2 Right Lower Leg: Turn and reposition every 2 hours Other: - sage boots at night and float heel when lying in bed Wound #3 Right Calcaneus: Turn and reposition every 2 hours Other: - sage boots at night and float heel when lying in bed Additional Orders / Instructions: Wound #1 Left Lower Leg: Increase protein intake. Wound #2 Right Lower Leg: Increase protein intake. Wound #3 Right Calcaneus: Increase protein intake. Medications-please add to medication list.: Wound #1 Left Lower Leg: Other: - Vitamin C, Vitamin A, Zinc, multivitamin Wound #2 Right Lower Leg: Other: - Vitamin C, Vitamin A, Zinc, multivitamin Wound #3 Right Calcaneus: Other: - Vitamin C, Vitamin A, Zinc,  multivitamin I have recommended: 1. Prisma AG to his right heel. 2. Constant off loading and also using Sage boot. 3. we will use Silver alginate and 3 layer Profore compressions for both lower extremities. 4. High-protein diet and multivitamins including vitamin C and zinc. Electronic Signature(s) Signed: 06/13/2016 9:55:33 AM By: Evlyn Kanner MD, FACS Entered By: Evlyn Kanner on 06/13/2016 09:55:33 Ranes, Kentrell V. ([id number]) [name], Francois VMarland Kitchen ([id number]) -------------------------------------------------------------------------------- SuperBill Details Patient Name: [name] Date of Service: 06/13/2016 Medical Record Number: [id number] Patient Account Number: [id number] Date of Birth/Sex: 1946-08-15 (70 y.o. 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 Facility Procedures CPT4 Code: [id number] Description: 11042 - DEB SUBQ TISSUE 20 SQ CM/< ICD-10 Description Diagnosis E11.621 Type 2 diabetes mellitus with foot ulcer L89.613 Pressure ulcer of right heel, stage 3 I89.0 Lymphedema, not elsewhere classified Modifier: Quantity: 1 Physician Procedures CPT4 Code: [id number] Description: 11042 - WC PHYS SUBQ TISS 20 SQ CM ICD-10 Description Diagnosis E11.621 Type 2 diabetes mellitus with foot ulcer L89.613 Pressure ulcer of right heel,  stage 3 I89.0 Lymphedema, not elsewhere classified Modifier: Quantity: 1 Electronic  Signature(s) Signed: 06/13/2016 9:55:51 AM By: Evlyn Kanner MD, FACS Entered By: Evlyn Kanner on 06/13/2016 09:55:49

## 2016-06-20 ENCOUNTER — Encounter: Payer: Medicare Other | Admitting: Surgery

## 2016-06-20 DIAGNOSIS — E11621 Type 2 diabetes mellitus with foot ulcer: Secondary | ICD-10-CM | POA: Diagnosis not present

## 2016-06-21 NOTE — Progress Notes (Signed)
GRADIE, [facility] ([id number]) Visit Report for 06/20/2016 Chief Complaint Document Details Patient Name: [name], [name] Date of Service: 06/20/2016 8:30 AM Medical Record Number: [id number] Patient Account Number: [id number] Date of Birth/Sex: 11-17-1945 (70 y.o. Male) Treating RN: Phillis Haggis Primary Care Physician: Oretha Milch Other Clinician: Referring Physician: Oretha Milch Treating Physician/Extender: Rudene Re in Treatment: 24 Information Obtained from: Patient Chief Complaint Patient is at the clinic for treatment of an open pressure ulcer the left upper thigh and gluteal region and the right heel with bilateral swelling of the legs all of it which is going on for over a year Electronic Signature(s) Signed: 06/20/2016 9:08:56 AM By: Evlyn Kanner MD, FACS Entered By: Evlyn Kanner on 06/20/2016 09:08:56 [name] ([id number]) -------------------------------------------------------------------------------- Debridement Details Patient Name: [name] Date of Service: 06/20/2016 8:30 AM Medical Record Number: [id number] Patient Account Number: [id number] Date of Birth/Sex: 1946-01-26 (70 y.o. Male) Treating RN: Phillis Haggis Primary Care Physician: Oretha Milch Other Clinician: Referring Physician: Oretha Milch Treating Physician/Extender: Rudene Re in Treatment: 24 Debridement Performed for Wound #3 Right Calcaneus Assessment: Performed By: Physician Evlyn Kanner, MD Debridement: Debridement Pre-procedure Yes Verification/Time Out Taken: Start Time: 08:58 Pain Control: Lidocaine 4% Topical Solution Level: Skin/Subcutaneous Tissue Total Area Debrided (L x 1 (cm) x 1.6 (cm) = 1.6 (cm) W): Tissue and other Non-Viable, Fibrin/Slough, Subcutaneous material debrided: Instrument: Curette Bleeding: Minimum Hemostasis Achieved: Pressure End Time: 09:04 Procedural Pain: 0 Post Procedural Pain: 0 Response to Treatment: Procedure was  tolerated well Post Debridement Measurements of Total Wound Length: (cm) 1 Stage: Category/Stage III Width: (cm) 1.6 Depth: (cm) 0.2 Volume: (cm) 0.251 Post Procedure Diagnosis Same as Pre-procedure Electronic Signature(s) Signed: 06/20/2016 9:08:49 AM By: Evlyn Kanner MD, FACS Signed: 06/20/2016 5:07:39 PM By: Alejandro Mulling Entered By: Evlyn Kanner on 06/20/2016 09:08:49 [name] ([id number]) -------------------------------------------------------------------------------- HPI Details Patient Name: [name] Date of Service: 06/20/2016 8:30 AM Medical Record Number: [id number] Patient Account Number: [id number] Date of Birth/Sex: 05/16/1946 (70 y.o. Male) Treating RN: Phillis Haggis Primary Care Physician: Oretha Milch Other Clinician: Referring Physician: Oretha Milch Treating Physician/Extender: Rudene Re in Treatment: 24 History of Present Illness Location: ulcerated area on the right heel, left gluteal region and thigh and then bilateral lower extremities Quality: Patient reports experiencing a sharp pain to affected area(s). Severity: Patient states wound are getting worse. Duration: Patient has had the wound for > 12 months prior to seeking treatment at the wound center Timing: Pain in wound is constant (hurts all the time) Context: The wound appeared gradually over time Modifying Factors: Other treatment(s) tried include: he sees his heart doctor and his primary care doctor Associated Signs and Symptoms: patient has not been able to walk for over a year now HPI Description: 70 year old gentleman with a known history of hypertension, diabetes, obstructive sleep apnea, COPD, diastolic CHF, coronary artery disease was admitted to the hospital with sepsis from an ulcer of the right heel and was treated there in October 2016. he also has chronic bilateral lower extremity edema and lymphedema. He had received vancomycin, Zosyn and at that stage and  x-rays showed hardware in the right ankle but no evidence of osteomyelitis. he was a former smoker. he was also treated with Augmentin orally for 10 days. He is either bedbound or wheelchair-bound and does not ambulate by himself. 01/19/2016 -- he has not been seen here for 3 weeks and this was because he was admitted to [facility] between 223  and 01/08/2016 for sepsis, UTI and pneumonia involving the left lung. he was treated with IV vancomycin and Zosyn and then meropenem. Was discharged home on oral Bactrim for 2 weeks none of his vascular test or x-rays were done and we will reorder these. 01/26/2016 -- he has not yet done the x-ray of his foot and is vascular tests are still pending. I have asked him to work on these with his nursing home staff. Addendum: he has got an x-ray of the right foot done which shows that his osteopenia but no specific ostial lysis or abnormal periosteal reaction. Final impression was degenerative changes with dorsal foot soft tissue swelling. 02/16/2016 -- lower extremity arterial duplex examination shows a 50-99% stenosis of the right tibioperoneal trunk. He had biphasic flow in the right SFA, popliteal and tibioperoneal trunk. Left-sided he had triphasic flow throughout 02/29/2016 -- he is awaiting his vascular opinion with Dr. Gilda Crease which is to be done on April 24 03/08/2016 -- he has not kept his appointment with Dr. Gilda Crease on April 24 and does not seem to know what happened about this. it's difficult to gauge whether he is in full control of his mental faculties as at times he is extremely rude to the nursing staff. 03/22/2016 -- the patient was seen by Dr. 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 Recommended that the patient had severe atherosclerotic changes of both lower extremities associated with ulceration  and tissue loss of the foot. This is a limb threatening ischemia and the patient was recommended to undergo [name], [name] ([id number]) angiography of the lower extremity with a hope for intervention for limb salvage. Patient agreed and will proceed to angiography. He was admitted to the hospital between May 2 and 03/15/2016 - he underwent induction of a catheter into his right lower extremity and third order catheter placement with contrast injection to the right lower extremity for distal runoff. Percutaneous transluminal angioplasty of the right superficial femoral and popliteal arteries were done. He also had a right peroneal angioplasty. Patient had a postoperative hematoma and was admitted for observation and in the next 24 hours he was very agitated and combative and had to be seen by psychiatric. He had a follow-up with Dr. Gilda Crease in 3 weeks. 04/18/2016 -- notes reviewed from the vascular office where he was seen for his postop visit after the PTA of the right SFA and popliteal arteries on 03/12/2016. He underwent an ABI which showed right ABI to be more than 1.3 and left to be more than 1 more than 1.3, great toe and PPG waveforms are decreased bilaterally. No additional intervention indicated at this time and the patient was to follow-up in 3 months with an ABI and bilateral lower extremity duplex study. 05/02/2016 -- he complains that his compression stockings are causing him a lot of discomfort and he is not happy wearing them. 05/30/2016 -- the swelling of both legs has increased again and he has had blisters which have opened out into ulcerations. Electronic Signature(s) Signed: 06/20/2016 9:09:01 AM By: Evlyn Kanner MD, FACS Entered By: Evlyn Kanner on 06/20/2016 09:09:01 [name] ([id number]) -------------------------------------------------------------------------------- Physical Exam Details Patient Name: [name] Date of Service: 06/20/2016 8:30  AM Medical Record Number: [id number] Patient Account Number: [id number] Date of Birth/Sex: [birth year] (70 y.o. Male) Treating RN: Phillis Haggis Primary Care Physician: Oretha Milch Other Clinician: Referring Physician: Oretha Milch  Treating Physician/Extender: Rudene Re in Treatment: 24 Constitutional . Pulse regular. Respirations normal and unlabored. Afebrile. . Eyes Nonicteric. Reactive to light. Ears, Nose, Mouth, and Throat Lips, teeth, and gums WNL.Marland Kitchen Moist mucosa without lesions. Neck supple and nontender. No palpable supraclavicular or cervical adenopathy. Normal sized without goiter. Respiratory WNL. No retractions.. Breath sounds WNL, No rubs, rales, rhonchi, or wheeze.. Cardiovascular Heart rhythm and rate regular, no murmur or gallop.. Pedal Pulses WNL. No clubbing, cyanosis or edema. Chest Breasts symmetical and no nipple discharge.. Breast tissue WNL, no masses, lumps, or tenderness.. Lymphatic No adneopathy. No adenopathy. No adenopathy. Musculoskeletal Adexa without tenderness or enlargement.. Digits and nails w/o clubbing, cyanosis, infection, petechiae, ischemia, or inflammatory conditions.. Integumentary (Hair, Skin) No suspicious lesions. No crepitus or fluctuance. No peri-wound warmth or erythema. No masses.Marland Kitchen Psychiatric Judgement and insight Intact.. No evidence of depression, anxiety, or agitation.. Notes The patient's right medial calcaneal area has quite a bit of maceration today and after sharply debriding witha #3 curet there is healthy granulation tissue at the base. Bleeding controlled with pressure. Electronic Signature(s) Signed: 06/20/2016 9:10:42 AM By: Evlyn Kanner MD, FACS Entered By: Evlyn Kanner on 06/20/2016 09:10:42 [name] ([id number]) -------------------------------------------------------------------------------- Physician Orders Details Patient Name: [name] Date of Service: 06/20/2016 8:30 AM Medical  Record Number: [id number] Patient Account Number: [id number] Date of Birth/Sex: 1945/12/05 (70 y.o. Male) Treating RN: [name], RN, [name], [place] [employer] Primary Care Physician: Oretha Milch Other Clinician: Referring Physician: Oretha Milch Treating Physician/Extender: Rudene Re in Treatment: 20 Verbal / Phone Orders: Yes Clinician: [name], RN, [name], Rita Read Back and Verified: Yes Diagnosis Coding Wound Cleansing Wound #1 Left Lower Leg o No tub bath. o Other: - Please do sink or bed baths **do not get leg wraps wet** Wound #2 Right Lower Leg o No tub bath. o Other: - Please do sink or bed baths **do not get leg wraps wet** Wound #3 Right Calcaneus o No tub bath. o Other: - Please do sink or bed baths **do not get leg wraps wet** Anesthetic Wound #1 Left Lower Leg o Topical Lidocaine 4% cream applied to wound bed prior to debridement - for clinic use only o Topical Lidocaine 4% cream applied to wound bed prior to debridement - for clinic use only Wound #2 Right Lower Leg o Topical Lidocaine 4% cream applied to wound bed prior to debridement - for clinic use only o Topical Lidocaine 4% cream applied to wound bed prior to debridement - for clinic use only Wound #3 Right Calcaneus o Topical Lidocaine 4% cream applied to wound bed prior to debridement - for clinic use only o Topical Lidocaine 4% cream applied to wound bed prior to debridement - for clinic use only Skin Barriers/Peri-Wound Care Wound #1 Left Lower Leg o Barrier cream - around the heel wound to reddened and irritated areas Wound #2 Right Lower Leg o Barrier cream - around the heel wound to reddened and irritated areas Wound #3 Right Calcaneus Gerard, Gust V. ([id number]) o Barrier cream - around the heel wound to reddened and irritated areas Primary Wound Dressing Wound #1 Left Lower Leg o Aquacel Ag - Please wet with saline before removing Wound #2 Right Lower Leg o Aquacel Ag -  Please wet with saline before removing Wound #3 Right Calcaneus o Aquacel Ag Secondary Dressing Wound #1 Left Lower Leg o ABD pad Wound #2 Right Lower Leg o ABD pad Wound #3 Right Calcaneus o Dry Gauze o Conform/Kerlix o Drawtex Dressing  Change Frequency Wound #1 Left Lower Leg o Change dressing every week - at the wound clinic Wound #2 Right Lower Leg o Change dressing every week - at the wound clinic Wound #3 Right Calcaneus o Change dressing every other day. - to be changed every other day by SNF RN Follow-up Appointments Wound #1 Left Lower Leg o Return Appointment in 1 week. Wound #2 Right Lower Leg o Return Appointment in 1 week. Wound #3 Right Calcaneus o Return Appointment in 1 week. Edema Control Wound #1 Left Lower Leg o 3 Layer Compression System - Bilateral Hanneman, Edgel V. ([id number]) o Elevate legs to the level of the heart and pump ankles as often as possible Wound #2 Right Lower Leg o 3 Layer Compression System - Bilateral o Elevate legs to the level of the heart and pump ankles as often as possible Off-Loading Wound #1 Left Lower Leg o Turn and reposition every 2 hours o Other: - sage boots at night and float heel when lying in bed Wound #2 Right Lower Leg o Turn and reposition every 2 hours o Other: - sage boots at night and float heel when lying in bed Wound #3 Right Calcaneus o Turn and reposition every 2 hours o Other: - sage boots at night and float heel when lying in bed Additional Orders / Instructions Wound #1 Left Lower Leg o Increase protein intake. Wound #2 Right Lower Leg o Increase protein intake. Wound #3 Right Calcaneus o Increase protein intake. Medications-please add to medication list. Wound #1 Left Lower Leg o Other: - Vitamin C, Vitamin A, Zinc, multivitamin Wound #2 Right Lower Leg o Other: - Vitamin C, Vitamin A, Zinc, multivitamin Wound #3 Right Calcaneus o  Other: - Vitamin C, Vitamin A, Zinc, multivitamin Electronic Signature(s) Signed: 06/20/2016 4:56:45 PM By: Evlyn Kanner MD, FACS Signed: 06/20/2016 4:59:27 PM By: Elpidio Eric [name], RN Entered By: Elpidio Eric on 06/20/2016 09:01:22 [name] ([id number]) [name], [place] [name] Kitchen ([id number]) -------------------------------------------------------------------------------- Problem List Details Patient Name: [name] Date of Service: 06/20/2016 8:30 AM Medical Record Number: [id number] Patient Account Number: [id number] Date of Birth/Sex: 06/27/1946 (70 y.o. Male) Treating RN: Phillis Haggis Primary Care Physician: Oretha Milch Other Clinician: Referring Physician: Oretha Milch Treating Physician/Extender: Rudene Re in Treatment: 24 Active Problems ICD-10 Encounter Code Description Active Date Diagnosis E11.621 Type 2 diabetes mellitus with foot ulcer 01/01/2016 Yes L89.613 Pressure ulcer of right heel, stage 3 01/01/2016 Yes E66.01 Morbid (severe) obesity due to excess calories 01/01/2016 Yes I89.0 Lymphedema, not elsewhere classified 01/01/2016 Yes M70.871 Other soft tissue disorders related to use, overuse and 01/19/2016 Yes pressure, right ankle and foot 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/16/2016 Yes ulceration of heel and midfoot Inactive Problems Resolved Problems ICD-10 Code Description Active Date Resolved Date L89.322 Pressure ulcer of left buttock, stage 2 01/01/2016 01/01/2016 Electronic Signature(s) Signed: 06/20/2016 9:08:32 AM By: Evlyn Kanner MD, FACS Entered By: Evlyn Kanner on 06/20/2016 09:08:31 [name] ([id number]) [name], [place] [name] Kitchen ([id number]) -------------------------------------------------------------------------------- Progress Note Details Patient Name: [name] Date of Service: 06/20/2016 8:30 AM Medical Record Number: [id number] Patient Account Number: [id number] Date of Birth/Sex: 09/12/1946 (70  y.o. Male) Treating RN: Phillis Haggis Primary Care Physician: Oretha Milch Other Clinician: Referring Physician: Oretha Milch Treating Physician/Extender: Rudene Re in Treatment: 24 Subjective Chief Complaint Information obtained from Patient Patient is at the clinic for treatment of an open pressure ulcer the left upper thigh and gluteal region and the right  heel with bilateral swelling of the legs all of it which is going on for over a year History of Present Illness (HPI) The following HPI elements were documented for the patient's wound: Location: ulcerated area on the right heel, left gluteal region and thigh and then bilateral lower extremities Quality: Patient reports experiencing a sharp pain to affected area(s). Severity: Patient states wound are getting worse. Duration: Patient has had the wound for > 12 months prior to seeking treatment at the wound center Timing: Pain in wound is constant (hurts all the time) Context: The wound appeared gradually over time Modifying Factors: Other treatment(s) tried include: he sees his heart doctor and his primary care doctor Associated Signs and Symptoms: patient has not been able to walk for over a year now 70 year old gentleman with a known history of hypertension, diabetes, obstructive sleep apnea, COPD, diastolic CHF, coronary artery disease was admitted to the hospital with sepsis from an ulcer of the right heel and was treated there in October 2016. he also has chronic bilateral lower extremity edema and lymphedema. He had received vancomycin, Zosyn and at that stage and x-rays showed hardware in the right ankle but no evidence of osteomyelitis. he was a former smoker. he was also treated with Augmentin orally for 10 days. He is either bedbound or wheelchair-bound and does not ambulate by himself. 01/19/2016 -- he has not been seen here for 3 weeks and this was because he was admitted to [facility]  between 223 and 01/08/2016 for sepsis, UTI and pneumonia involving the left lung. he was treated with IV vancomycin and Zosyn and then meropenem. Was discharged home on oral Bactrim for 2 weeks none of his vascular test or x-rays were done and we will reorder these. 01/26/2016 -- he has not yet done the x-ray of his foot and is vascular tests are still pending. I have asked him to work on these with his nursing home staff. Addendum: he has got an x-ray of the right foot done which shows that his osteopenia but no specific ostial lysis or abnormal periosteal reaction. Final impression was degenerative changes with dorsal foot soft tissue swelling. 02/16/2016 -- lower extremity arterial duplex examination shows a 50-99% stenosis of the right tibioperoneal trunk. He had biphasic flow in the right SFA, popliteal and tibioperoneal trunk. Left-sided he had triphasic flow throughout [name], [name] ([id number]) 02/29/2016 -- he is awaiting his vascular opinion with Dr. Gilda Crease which is to be done on April 24 03/08/2016 -- he has not kept his appointment with Dr. Gilda Crease on April 24 and does not seem to know what happened about this. it's difficult to gauge whether he is in full control of his mental faculties as at times he is extremely rude to the nursing staff. 03/22/2016 -- the patient was seen by Dr. 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 Recommended that the patient had severe atherosclerotic changes of both lower extremities associated with ulceration and tissue loss of the foot. This is a limb threatening ischemia and the patient was recommended to undergo angiography of the lower extremity with a hope for intervention for limb salvage. Patient agreed and will proceed to angiography. He was admitted to the hospital between May 2 and 03/15/2016 - he underwent induction of a  catheter into his right lower extremity and third order catheter placement with contrast injection to the right lower  extremity for distal runoff. Percutaneous transluminal angioplasty of the right superficial femoral and popliteal arteries were done. He also had a right peroneal angioplasty. Patient had a postoperative hematoma and was admitted for observation and in the next 24 hours he was very agitated and combative and had to be seen by psychiatric. He had a follow-up with Dr. Gilda Crease in 3 weeks. 04/18/2016 -- notes reviewed from the vascular office where he was seen for his postop visit after the PTA of the right SFA and popliteal arteries on 03/12/2016. He underwent an ABI which showed right ABI to be more than 1.3 and left to be more than 1 more than 1.3, great toe and PPG waveforms are decreased bilaterally. No additional intervention indicated at this time and the patient was to follow-up in 3 months with an ABI and bilateral lower extremity duplex study. 05/02/2016 -- he complains that his compression stockings are causing him a lot of discomfort and he is not happy wearing them. 05/30/2016 -- the swelling of both legs has increased again and he has had blisters which have opened out into ulcerations. Objective Constitutional Pulse regular. Respirations normal and unlabored. Afebrile. Vitals Time Taken: 8:34 AM, Height: 70 in, Weight: 235 lbs, BMI: 33.7, Temperature: 98.2 F, Pulse: 62 bpm, Respiratory Rate: 18 breaths/min, Blood Pressure: 123/60 mmHg. Eyes Nonicteric. Reactive to light. Ears, Nose, Mouth, and Throat Lips, teeth, and gums WNL.Marland Kitchen Moist mucosa without lesions. Neck Vanderweele, Leif V. ([id number]) supple and nontender. No palpable supraclavicular or cervical adenopathy. Normal sized without goiter. Respiratory WNL. No retractions.. Breath sounds WNL, No rubs, rales, rhonchi, or wheeze.. Cardiovascular Heart rhythm and rate regular, no murmur or gallop.. Pedal  Pulses WNL. No clubbing, cyanosis or edema. Chest Breasts symmetical and no nipple discharge.. Breast tissue WNL, no masses, lumps, or tenderness.. Lymphatic No adneopathy. No adenopathy. No adenopathy. Musculoskeletal Adexa without tenderness or enlargement.. Digits and nails w/o clubbing, cyanosis, infection, petechiae, ischemia, or inflammatory conditions.Marland Kitchen Psychiatric Judgement and insight Intact.. No evidence of depression, anxiety, or agitation.. General Notes: The patient's right medial calcaneal area has quite a bit of maceration today and after sharply debriding witha #3 curet there is healthy granulation tissue at the base. Bleeding controlled with pressure. Integumentary (Hair, Skin) No suspicious lesions. No crepitus or fluctuance. No peri-wound warmth or erythema. No masses.. Wound #1 status is Open. Original cause of wound was Gradually Appeared. The wound is located on the Left Lower Leg. The wound measures 6cm length x 8.5cm width x 0.1cm depth; 40.055cm^2 area and 4.006cm^3 volume. The wound is limited to skin breakdown. There is no tunneling or undermining noted. There is a large amount of serous drainage noted. The wound margin is flat and intact. There is medium (34-66%) pink granulation within the wound bed. There is a medium (34-66%) amount of necrotic tissue within the wound bed including Eschar. The periwound skin appearance exhibited: Localized Edema, Moist, Erythema. The surrounding wound skin color is noted with erythema which is circumferential. Periwound temperature was noted as No Abnormality. The periwound has tenderness on palpation. Wound #2 status is Open. Original cause of wound was Gradually Appeared. The wound is located on the Right Lower Leg. The wound measures 3.2cm length x 0.5cm width x 0.1cm depth; 1.257cm^2 area and 0.126cm^3 volume. The wound is limited to skin breakdown. There is no tunneling or undermining noted. There is a large amount of  serosanguineous drainage noted. The wound margin is flat and intact. There is no granulation within the wound bed.  There is a large (67-100%) amount of necrotic tissue within the wound bed including Eschar and Adherent Slough. The periwound skin appearance exhibited: Localized Edema, Moist, Erythema. The periwound skin appearance did not exhibit: Maceration. The surrounding wound skin color is noted with erythema which is circumferential. Periwound temperature was noted as No Abnormality. The periwound has tenderness on palpation. Wound #3 status is Open. Original cause of wound was Pressure Injury. The wound is located on the Right Calcaneus. The wound measures 1cm length x 1.6cm width x 0.2cm depth; 1.257cm^2 area and 0.251cm^3 volume. The wound is limited to skin breakdown. There is no tunneling or undermining noted. [name], [name] ([id number]) There is a large amount of serous drainage noted. The wound margin is flat and intact. There is medium (34-66%) red, pink granulation within the wound bed. There is a medium (34-66%) amount of necrotic tissue within the wound bed including Adherent Slough. The periwound skin appearance exhibited: Localized Edema, Maceration, Moist, Erythema. The surrounding wound skin color is noted with erythema which is circumferential. Periwound temperature was noted as No Abnormality. The periwound has tenderness on palpation. 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 There was a Skin/Subcutaneous  Tissue Debridement ([id number]) debridement with total area of 1.6 sq cm performed by Evlyn Kanner, MD. with the following instrument(s): Curette to remove Non-Viable tissue/material including Fibrin/Slough and Subcutaneous after achieving pain control using Lidocaine 4% Topical Solution. A time out was conducted prior to the start of the procedure. A Minimum amount of bleeding was controlled with Pressure. The procedure was tolerated well with a pain level of 0 throughout and a pain level of 0 following the procedure. Post Debridement Measurements: 1cm length x 1.6cm width x 0.2cm depth; 0.251cm^3 volume. Post debridement Stage noted as Category/Stage III. Post procedure Diagnosis Wound #3: Same as Pre-Procedure Plan Wound Cleansing: Wound #1 Left Lower Leg: [name], [name] ([id number]) No tub bath. Other: - Please do sink or bed baths **do not get leg wraps wet** Wound #2 Right Lower Leg: No tub bath. Other: - Please do sink or bed baths **do not get leg wraps wet** Wound #3 Right Calcaneus: No tub bath. Other: - Please do sink or bed baths **do not get leg wraps wet** Anesthetic: Wound #1 Left Lower Leg: Topical Lidocaine 4% cream applied to wound bed prior to debridement - for clinic use only Topical Lidocaine 4% cream applied to wound bed prior to debridement - for clinic use only Wound #2 Right Lower Leg: Topical Lidocaine 4% cream applied to wound bed prior to debridement - for clinic use only Topical Lidocaine 4% cream applied to wound bed prior to debridement - for clinic use only Wound #3 Right Calcaneus: Topical Lidocaine 4% cream applied to wound bed prior to debridement - for clinic use only Topical Lidocaine 4% cream applied to wound bed prior to debridement - for clinic use only Skin Barriers/Peri-Wound Care: Wound #1 Left Lower Leg: Barrier cream - around the heel wound to reddened and irritated areas Wound #2 Right Lower Leg: Barrier cream - around  the heel  wound to reddened and irritated areas Wound #3 Right Calcaneus: Barrier cream - around the heel wound to reddened and irritated areas Primary Wound Dressing: Wound #1 Left Lower Leg: Aquacel Ag - Please wet with saline before removing Wound #2 Right Lower Leg: Aquacel Ag - Please wet with saline before removing Wound #3 Right Calcaneus: Aquacel Ag Secondary Dressing: Wound #1 Left Lower Leg: ABD pad Wound #2 Right Lower Leg: ABD pad Wound #3 Right Calcaneus: Dry Gauze Conform/Kerlix Drawtex Dressing Change Frequency: Wound #1 Left Lower Leg: Change dressing every week - at the wound clinic Wound #2 Right Lower Leg: Change dressing every week - at the wound clinic Wound #3 Right Calcaneus: Change dressing every other day. - to be changed every other day by [facility] RN Follow-up Appointments: Wound #1 Left Lower Leg: Return Appointment in 1 week. [name], [name] Kitchen ([id number]) Wound #2 Right Lower Leg: Return Appointment in 1 week. Wound #3 Right Calcaneus: Return Appointment in 1 week. Edema Control: Wound #1 Left Lower Leg: 3 Layer Compression System - Bilateral Elevate legs to the level of the heart and pump ankles as often as possible Wound #2 Right Lower Leg: 3 Layer Compression System - Bilateral Elevate legs to the level of the heart and pump ankles as often as possible Off-Loading: Wound #1 Left Lower Leg: Turn and reposition every 2 hours Other: - sage boots at night and float heel when lying in bed Wound #2 Right Lower Leg: Turn and reposition every 2 hours Other: - sage boots at night and float heel when lying in bed Wound #3 Right Calcaneus: Turn and reposition every 2 hours Other: - sage boots at night and float heel when lying in bed Additional Orders / Instructions: Wound #1 Left Lower Leg: Increase protein intake. Wound #2 Right Lower Leg: Increase protein intake. Wound #3 Right Calcaneus: Increase protein intake. Medications-please add to medication  list.: Wound #1 Left Lower Leg: Other: - Vitamin C, Vitamin A, Zinc, multivitamin Wound #2 Right Lower Leg: Other: - Vitamin C, Vitamin A, Zinc, multivitamin Wound #3 Right Calcaneus: Other: - Vitamin C, Vitamin A, Zinc, multivitamin I have recommended: 1. Silver alginte AG to his right heel. 2. Constant off loading and also using Sage boot. 3. we will use Silver alginate and 3 layer Profore compressions for both lower extremities. 4. High-protein diet and multivitamins including vitamin C and zinc. Electronic Signature(s) ZAY, YEARGAN ([id number]) Signed: 06/20/2016 9:12:01 AM By: Evlyn Kanner MD, FACS Entered By: Evlyn Kanner on 06/20/2016 09:12:01 [name] ([id number]) -------------------------------------------------------------------------------- SuperBill Details Patient Name: [name] Date of Service: 06/20/2016 Medical Record Number: [id number] Patient Account Number: [id number] Date of Birth/Sex: [birth year] (70 y.o. 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 Facility Procedures CPT4 Code: [id number] Description: 11042 - DEB SUBQ TISSUE 20 SQ CM/< ICD-10 Description Diagnosis E11.621 Type 2 diabetes mellitus with foot ulcer L89.613 Pressure ulcer of right heel, stage 3 E66.01 Morbid (severe) obesity due to excess calories I89.0 Lymphedema, not  elsewhere classified  Modifier: Quantity:  1 Physician Procedures CPT4 Code: [id number] Description: 11042 - WC PHYS SUBQ TISS 20 SQ CM ICD-10 Description Diagnosis E11.621 Type 2 diabetes mellitus with foot ulcer L89.613 Pressure ulcer of right heel, stage 3 E66.01 Morbid (severe) obesity due to excess calories I89.0 Lymphedema, not  elsewhere classified Modifier: Quantity: 1 Electronic Signature(s) Signed: 06/20/2016 9:12:19 AM By: Evlyn KannerBritto, Lemmie Steinhaus MD, FACS Entered By: Evlyn KannerBritto, Cord Wilczynski on 06/20/2016 09:12:18

## 2016-06-21 NOTE — Progress Notes (Signed)
[name], [name] ([id number]) Visit Report for 06/20/2016 Arrival Information Details Patient Name: [name], [name] Date of Service: 06/20/2016 8:30 AM Medical Record Number: [id number] Patient Account Number: [id number] Date of Birth/Sex: 1945/11/27 (70 y.o. Male) Treating RN: Phillis Haggis Primary Care Physician: Oretha Milch Other Clinician: Referring Physician: Oretha Milch Treating Physician/Extender: Rudene Re in Treatment: 24 Visit Information History Since Last Visit All ordered tests and consults were completed: No Patient Arrived: Wheel Chair Added or deleted any medications: No Arrival Time: 08:29 Any new allergies or adverse reactions: No Accompanied By: self Had a fall or experienced change in No Transfer Assistance: EasyPivot activities of daily living that may affect Patient Lift risk of falls: Patient Identification Verified: Yes Signs or symptoms of abuse/neglect since last No Secondary Verification Process Yes visito Completed: Hospitalized since last visit: No Patient Requires Transmission- No Pain Present Now: Yes Based Precautions: Patient Has Alerts: Yes Patient Alerts: DM II Electronic Signature(s) Signed: 06/20/2016 5:07:39 PM By: Alejandro Mulling Entered By: Alejandro Mulling on 06/20/2016 08:31:57 [name] ([id number]) -------------------------------------------------------------------------------- Encounter Discharge Information Details Patient Name: [name] Date of Service: 06/20/2016 8:30 AM Medical Record Number: [id number] Patient Account Number: [id number] Date of Birth/Sex: Jun 28, 1946 (70 y.o. Male) Treating RN: Phillis Haggis Primary Care Physician: Oretha Milch Other Clinician: Referring Physician: Oretha Milch Treating Physician/Extender: Rudene Re in Treatment: 24 Encounter Discharge Information Items Discharge Pain Level: 0 Discharge Condition: Stable Ambulatory Status: Wheelchair Discharge  Destination: Nursing Home Transportation: Other Accompanied By: self Schedule Follow-up Appointment: Yes Medication Reconciliation completed and provided to Patient/Care Yes Seraphina Mitchner: Provided on Clinical Summary of Care: 06/20/2016 Form Type Recipient Paper Patient EL Electronic Signature(s) Signed: 06/20/2016 9:18:23 AM By: Gwenlyn Perking Entered By: Gwenlyn Perking on 06/20/2016 09:18:22 [name] ([id number]) -------------------------------------------------------------------------------- Lower Extremity Assessment Details Patient Name: [name] Date of Service: 06/20/2016 8:30 AM Medical Record Number: [id number] Patient Account Number: [id number] Date of Birth/Sex: 10-22-1946 (70 y.o. Male) Treating RN: Phillis Haggis Primary Care Physician: Oretha Milch Other Clinician: Referring Physician: Oretha Milch Treating Physician/Extender: Rudene Re in Treatment: 24 Edema Assessment Assessed: [Left: No] [Right: No] E[Left: dema] [Right: :] Calf Left: Right: Point of Measurement: 34 cm From Medial Instep 38.4 cm 34.6 cm Ankle Left: Right: Point of Measurement: 12 cm From Medial Instep 25.2 cm 22.6 cm Vascular Assessment Pulses: Posterior Tibial Dorsalis Pedis Palpable: [Left:Yes] [Right:Yes] Extremity colors, hair growth, and conditions: Extremity Color: [Left:Hyperpigmented] [Right:Hyperpigmented] Temperature of Extremity: [Left:Warm] [Right:Warm] Capillary Refill: [Left:< 3 seconds] [Right:< 3 seconds] Toe Nail Assessment Left: Right: Thick: Yes Yes Discolored: Yes Yes Deformed: No No Improper Length and Hygiene: Yes Yes Electronic Signature(s) Signed: 06/20/2016 5:07:39 PM By: Alejandro Mulling Entered By: Alejandro Mulling on 06/20/2016 08:47:14 Kimery, Crue VMarland Kitchen ([id number]) -------------------------------------------------------------------------------- Multi Wound Chart Details Patient Name: [name] Date of Service: 06/20/2016 8:30  AM Medical Record Number: [id number] Patient Account Number: [id number] Date of Birth/Sex: [birth year] (70 y.o. Male) Treating RN: Phillis Haggis Primary Care Physician: Oretha Milch Other Clinician: Referring Physician: Oretha Milch Treating Physician/Extender: Rudene Re in Treatment: 24 Vital Signs Height(in): 70 Pulse(bpm): 62 Weight(lbs): 235 Blood Pressure 123/60 (mmHg): Body Mass Index(BMI): 34 Temperature(F): 98.2 Respiratory Rate 18 (breaths/min): Photos: [1:No Photos] [2:No Photos] [3:No Photos] Wound Location: [1:Left Lower Leg] [2:Right Lower Leg] [3:Right Calcaneus] Wounding Event: [1:Gradually Appeared] [2:Gradually Appeared] [3:Pressure Injury] Primary Etiology: [1:Venous Leg Ulcer] [2:Venous Leg Ulcer] [3:Pressure Ulcer] Comorbid History: [1:Cataracts, Chronic Obstructive Pulmonary Disease (COPD), Sleep Disease (COPD), Sleep Disease (COPD), Sleep  Apnea, Angina, Congestive Heart Failure, Congestive Heart Failure, Congestive Heart Failure, Hypertension, Type II Diabetes,  Gout, Osteoarthritis, Neuropathy Osteoarthritis, Neuropathy Osteoarthritis, Neuropathy] [2:Cataracts, Chronic Obstructive Pulmonary Apnea, Angina, Hypertension, Type II Diabetes, Gout,] [3:Cataracts, Chronic Obstructive Pulmonary Apnea, Angina,  Hypertension, Type II Diabetes, Gout,] Date Acquired: [1:12/31/2014] [2:12/31/2014] [3:10/31/2015] Weeks of Treatment: [1:24] [2:24] [3:24] Wound Status: [1:Open] [2:Open] [3:Open] Measurements L x W x D 6x8.5x0.1 [2:3.2x0.5x0.1] [3:1x1.6x0.2] (cm) Area (cm) : [1:40.055] [2:1.257] [3:1.257] Volume (cm) : [1:4.006] [2:0.126] [3:0.251] % Reduction in Area: [1:-63.50%] [2:-432.60%] [3:82.20%] % Reduction in Volume: -63.50% [2:-425.00%] [3:82.20%] Classification: [1:Partial Thickness] [2:Partial Thickness] [3:Category/Stage III] HBO Classification: [1:Grade 1] [2:Grade 1] [3:Grade 1] Exudate Amount: [1:Large] [2:Large] [3:Large] Exudate Type:  [1:Serous] [2:Serosanguineous] [3:Serous] Exudate Color: [1:amber] [2:red, brown] [3:amber] Wound Margin: [1:Flat and Intact] [2:Flat and Intact] [3:Flat and Intact] Granulation Amount: [1:Medium (34-66%)] [2:None Present (0%)] [3:Medium (34-66%)] Granulation Quality: [1:Pink] [2:N/A] [3:Red, Pink] Necrotic Amount: [1:Medium (34-66%)] [2:Large (67-100%)] [3:Medium (34-66%)] Necrotic Tissue: Eschar Eschar, Adherent Slough Adherent Slough Exposed Structures: Fascia: No Fascia: No Fascia: No Fat: No Fat: No Fat: No Tendon: No Tendon: No Tendon: No Muscle: No Muscle: No Muscle: No Joint: No Joint: No Joint: No Bone: No Bone: No Bone: No Limited to Skin Limited to Skin Limited to Skin Breakdown Breakdown Breakdown Epithelialization: Medium (34-66%) Medium (34-66%) None Periwound Skin Texture: Edema: Yes Edema: Yes Edema: Yes Periwound Skin Moist: Yes Moist: Yes Maceration: Yes Moisture: Maceration: No Moist: Yes Periwound Skin Color: Erythema: Yes Erythema: Yes Erythema: Yes Erythema Location: Circumferential Circumferential Circumferential Temperature: No Abnormality No Abnormality No Abnormality Tenderness on Yes Yes Yes Palpation: Wound Preparation: Ulcer Cleansing: Other: Ulcer Cleansing: Other: Ulcer Cleansing: soap and water soap and water Rinsed/Irrigated with Saline Topical Anesthetic Topical Anesthetic Applied: Other: lidocaine Applied: Other: lidocaine Topical Anesthetic 4% 4% Applied: Other: lidocaine 4% Treatment Notes Electronic Signature(s) Signed: 06/20/2016 5:07:39 PM By: Alejandro Mulling Entered By: Alejandro Mulling on 06/20/2016 08:52:05 [name] ([id number]) -------------------------------------------------------------------------------- Multi-Disciplinary Care Plan Details Patient Name: [name] Date of Service: 06/20/2016 8:30 AM Medical Record Number: [id number] Patient Account Number: [id number] Date of Birth/Sex: 1945/11/28  (70 y.o. Male) Treating RN: Phillis Haggis Primary Care Physician: Oretha Milch Other Clinician: Referring Physician: Oretha Milch Treating Physician/Extender: Rudene Re in Treatment: 37 Active Inactive Abuse / Safety / Falls / Self Care Management Nursing Diagnoses: Potential for falls Goals: Patient will remain injury free Date Initiated: 03/01/2016 Goal Status: Active Interventions: Assess fall risk on admission and as needed Notes: Nutrition Nursing Diagnoses: Imbalanced nutrition Goals: Patient/caregiver agrees to and verbalizes understanding of need to use nutritional supplements and/or vitamins as prescribed Date Initiated: 03/01/2016 Goal Status: Active Interventions: Assess patient nutrition upon admission and as needed per policy Notes: Orientation to the Wound Care Program Nursing Diagnoses: Knowledge deficit related to the wound healing center program Goals: Patient/caregiver will verbalize understanding of the W[facility]aling Center [address] RIGDON, MACOMBER ([id number]) Date Initiated: 03/01/2016 Goal Status: Active Interventions: Provide education on orientation to the wound center Notes: Pain, Acute or Chronic Nursing Diagnoses: Pain, acute or chronic: actual or potential Potential alteration in comfort, pain Goals: Patient will verbalize adequate pain control and receive pain control interventions during procedures as needed Date Initiated: 03/01/2016 Goal Status: Active Patient/caregiver will verbalize adequate pain control between visits Date Initiated: 03/01/2016 Goal Status: Active Interventions: Assess comfort goal upon admission Complete pain assessment as per visit requirements Notes: Pressure Nursing Diagnoses: Knowledge deficit related to causes and risk factors for pressure ulcer development  Knowledge deficit related to management of pressures ulcers Goals: Patient/caregiver will verbalize risk factors for pressure ulcer  development Date Initiated: 03/01/2016 Goal Status: Active Interventions: Assess offloading mechanisms upon admission and as needed Notes: Wound/Skin Impairment Nursing Diagnoses: TEODOR, PRATER. ([id number]) Impaired tissue integrity Goals: Ulcer/skin breakdown will have a volume reduction of 30% by week 4 Date Initiated: 03/01/2016 Goal Status: Active Ulcer/skin breakdown will have a volume reduction of 50% by week 8 Date Initiated: 03/01/2016 Goal Status: Active Ulcer/skin breakdown will have a volume reduction of 80% by week 12 Date Initiated: 03/01/2016 Goal Status: Active Interventions: Assess ulceration(s) every visit Notes: Electronic Signature(s) Signed: 06/20/2016 5:07:39 PM By: Alejandro Mulling Entered By: Alejandro Mulling on 06/20/2016 08:51:59 [name], [name] VMarland Kitchen ([id number]) -------------------------------------------------------------------------------- Pain Assessment Details Patient Name: [name] Date of Service: 06/20/2016 8:30 AM Medical Record Number: [id number] Patient Account Number: [id number] Date of Birth/Sex: 1946/10/20 (70 y.o. Male) Treating RN: Phillis Haggis Primary Care Physician: Oretha Milch Other Clinician: Referring Physician: Oretha Milch Treating Physician/Extender: Rudene Re in Treatment: 24 Active Problems Location of Pain Severity and Description of Pain Patient Has Paino Yes Site Locations Pain Location: Pain in Ulcers With Dressing Change: Yes Duration of the Pain. Constant / Intermittento Constant Rate the pain. Current Pain Level: 6 Worst Pain Level: 10 Least Pain Level: 3 Character of Pain Describe the Pain: Aching, Tender, Throbbing Pain Management and Medication Current Pain Management: Electronic Signature(s) Signed: 06/20/2016 5:07:39 PM By: Alejandro Mulling Entered By: Alejandro Mulling on 06/20/2016 08:34:03 [name]  ([id number]) -------------------------------------------------------------------------------- Patient/Caregiver Education Details Patient Name: [name] Date of Service: 06/20/2016 8:30 AM Medical Record Number: [id number] Patient Account Number: [id number] Date of Birth/Gender: August 06, 1946 (70 y.o. Male) Treating RN: Phillis Haggis Primary Care Physician: Oretha Milch Other Clinician: Referring Physician: Oretha Milch Treating Physician/Extender: Rudene Re in Treatment: 24 Education Assessment Education Provided To: Patient Education Topics Provided Wound/Skin Impairment: Handouts: Other: change dressing as ordered and keep wraps dry and clean Methods: Demonstration, Explain/Verbal Responses: State content correctly Electronic Signature(s) Signed: 06/20/2016 5:07:39 PM By: Alejandro Mulling Entered By: Alejandro Mulling on 06/20/2016 08:56:32 [name], [name] ([id number]) -------------------------------------------------------------------------------- Wound Assessment Details Patient Name: [name] Date of Service: 06/20/2016 8:30 AM Medical Record Number: [id number] Patient Account Number: [id number] Date of Birth/Sex: 1946/09/19 (70 y.o. Male) Treating RN: Phillis Haggis Primary Care Physician: Oretha Milch Other Clinician: Referring Physician: Oretha Milch Treating Physician/Extender: Rudene Re in Treatment: 24 Wound Status Wound Number: 1 Primary Venous Leg Ulcer Etiology: Wound Location: Left Lower Leg Wound Open Wounding Event: Gradually Appeared Status: Date Acquired: 12/31/2014 Comorbid Cataracts, Chronic Obstructive Weeks Of Treatment: 24 History: Pulmonary Disease (COPD), Sleep Clustered Wound: No Apnea, Angina, Congestive Heart Failure, Hypertension, Type II Diabetes, Gout, Osteoarthritis, Neuropathy Photos Photo Uploaded By: Alejandro Mulling on 06/20/2016 09:27:25 Wound Measurements Length: (cm) 6 Width: (cm) 8.5 Depth:  (cm) 0.1 Area: (cm) 40.055 Volume: (cm) 4.006 % Reduction in Area: -63.5% % Reduction in Volume: -63.5% Epithelialization: Medium (34-66%) Tunneling: No Undermining: No Wound Description Classification: Partial Thickness Foul Odor Af Diabetic Severity [address]): Grade 1 Wound Margin: Flat and Intact Exudate Amount: Large Exudate Type: Serous Exudate Color: amber ter Cleansing: No Wound Bed Granulation Amount: Medium (34-66%) Exposed Structure Larmer, Wyett V. ([id number]) Granulation Quality: Pink Fascia Exposed: No Necrotic Amount: Medium (34-66%) Fat Layer Exposed: No Necrotic Quality: Eschar Tendon Exposed: No Muscle Exposed: No Joint Exposed: No Bone Exposed: No Limited to Skin Breakdown Periwound Skin Texture Texture Color No Abnormalities  Noted: No No Abnormalities Noted: No Localized Edema: Yes Erythema: Yes Erythema Location: Circumferential Moisture No Abnormalities Noted: No Temperature / Pain Moist: Yes Temperature: No Abnormality Tenderness on Palpation: Yes Wound Preparation Ulcer Cleansing: Other: soap and water, Topical Anesthetic Applied: Other: lidocaine 4%, Treatment Notes Wound #1 (Left Lower Leg) 1. Cleansed with: Cleanse wound with antibacterial soap and water 2. Anesthetic Topical Lidocaine 4% cream to wound bed prior to debridement 4. Dressing Applied: Aquacel Ag 7. Secured with 3 Layer Compression System - Bilateral Electronic Signature(s) Signed: 06/20/2016 5:07:39 PM By: Alejandro MullingPinkerton, Debra Entered By: Alejandro MullingPinkerton, Debra on 06/20/2016 08:48:26 [name], [name] VMarland Kitchen. ([id number]) -------------------------------------------------------------------------------- Wound Assessment Details Patient Name: [name], [name] Date of Service: 06/20/2016 8:30 AM Medical Record Number: [id number] Patient Account Number: [id number] Date of Birth/Sex: 05-07-1946 74(70 y.o. Male) Treating RN: Phillis HaggisPinkerton, Debi Primary Care Physician: Oretha MilchSMITH, [name] Other  Clinician: Referring Physician: Oretha MilchSMITH, [name] Treating Physician/Extender: Rudene ReBritto, Errol Weeks in Treatment: 24 Wound Status Wound Number: 2 Primary Venous Leg Ulcer Etiology: Wound Location: Right Lower Leg Wound Open Wounding Event: Gradually Appeared Status: Date Acquired: 12/31/2014 Comorbid Cataracts, Chronic Obstructive Weeks Of Treatment: 24 History: Pulmonary Disease (COPD), Sleep Clustered Wound: No Apnea, Angina, Congestive Heart Failure, Hypertension, Type II Diabetes, Gout, Osteoarthritis, Neuropathy Photos Photo Uploaded By: Alejandro MullingPinkerton, Debra on 06/20/2016 09:27:26 Wound Measurements Length: (cm) 3.2 Width: (cm) 0.5 Depth: (cm) 0.1 Area: (cm) 1.257 Volume: (cm) 0.126 % Reduction in Area: -432.6% % Reduction in Volume: -425% Epithelialization: Medium (34-66%) Tunneling: No Undermining: No Wound Description Classification: Partial Thickness Diabetic Severity (Wagner): Grade 1 Wound Margin: Flat and Intact Exudate Amount: Large Exudate Type: Serosanguineous Exudate Color: red, brown Foul Odor After Cleansing: No Wound Bed Granulation Amount: None Present (0%) Exposed Structure Dantonio, Keanthony V. ([id number]) Necrotic Amount: Large (67-100%) Fascia Exposed: No Necrotic Quality: Eschar, Adherent Slough Fat Layer Exposed: No Tendon Exposed: No Muscle Exposed: No Joint Exposed: No Bone Exposed: No Limited to Skin Breakdown Periwound Skin Texture Texture Color No Abnormalities Noted: No No Abnormalities Noted: No Localized Edema: Yes Erythema: Yes Erythema Location: Circumferential Moisture No Abnormalities Noted: No Temperature / Pain Maceration: No Temperature: No Abnormality Moist: Yes Tenderness on Palpation: Yes Wound Preparation Ulcer Cleansing: Other: soap and water, Topical Anesthetic Applied: Other: lidocaine 4%, Treatment Notes Wound #2 (Right Lower Leg) 1. Cleansed with: Cleanse wound with antibacterial soap and water 2.  Anesthetic Topical Lidocaine 4% cream to wound bed prior to debridement 4. Dressing Applied: Aquacel Ag 7. Secured with 3 Layer Compression System - Bilateral Electronic Signature(s) Signed: 06/20/2016 5:07:39 PM By: Alejandro MullingPinkerton, Debra Entered By: Alejandro MullingPinkerton, Debra on 06/20/2016 08:49:13 [name], Lejuan V. ([id number]) -------------------------------------------------------------------------------- Wound Assessment Details Patient Name: [name], Ahmeer V. Date of Service: 06/20/2016 8:30 AM Medical Record Number: [id number] Patient Account Number: [id number] Date of Birth/Sex: 05-07-1946 6(70 y.o. Male) Treating RN: Phillis HaggisPinkerton, Debi Primary Care Physician: Oretha MilchSMITH, [name] Other Clinician: Referring Physician: Oretha MilchSMITH, [name] Treating Physician/Extender: Rudene ReBritto, Errol Weeks in Treatment: 24 Wound Status Wound Number: 3 Primary Pressure Ulcer Etiology: Wound Location: Right Calcaneus Wound Open Wounding Event: Pressure Injury Status: Date Acquired: 10/31/2015 Comorbid Cataracts, Chronic Obstructive Weeks Of Treatment: 24 History: Pulmonary Disease (COPD), Sleep Clustered Wound: No Apnea, Angina, Congestive Heart Failure, Hypertension, Type II Diabetes, Gout, Osteoarthritis, Neuropathy Photos Photo Uploaded By: Alejandro MullingPinkerton, Debra on 06/20/2016 09:27:47 Wound Measurements Length: (cm) 1 Width: (cm) 1.6 Depth: (cm) 0.2 Area: (cm) 1.257 Volume: (cm) 0.251 % Reduction in Area: 82.2% % Reduction in Volume: 82.2% Epithelialization: None Tunneling: No Undermining: No Wound Description Classification:  Category/Stage III Foul Odor A Diabetic Severity (Wagner): Grade 1 Wound Margin: Flat and Intact Exudate Amount: Large Exudate Type: Serous Exudate Color: amber fter Cleansing: No Wound Bed Granulation Amount: Medium (34-66%) Exposed Structure Circle, Claudie V. ([id number]) Granulation Quality: Red, Pink Fascia Exposed: No Necrotic Amount: Medium (34-66%) Fat Layer Exposed:  No Necrotic Quality: Adherent Slough Tendon Exposed: No Muscle Exposed: No Joint Exposed: No Bone Exposed: No Limited to Skin Breakdown Periwound Skin Texture Texture Color No Abnormalities Noted: No No Abnormalities Noted: No Localized Edema: Yes Erythema: Yes Erythema Location: Circumferential Moisture No Abnormalities Noted: No Temperature / Pain Maceration: Yes Temperature: No Abnormality Moist: Yes Tenderness on Palpation: Yes Wound Preparation Ulcer Cleansing: Rinsed/Irrigated with Saline Topical Anesthetic Applied: Other: lidocaine 4%, Treatment Notes Wound #3 (Right Calcaneus) 1. Cleansed with: Cleanse wound with antibacterial soap and water 2. Anesthetic Topical Lidocaine 4% cream to wound bed prior to debridement 4. Dressing Applied: Aquacel Ag Other dressing (specify in notes) 5. Secondary Dressing Applied Kerlix/Conform 7. Secured with Tape Notes drawtex Electronic Signature(s) Signed: 06/20/2016 5:07:39 PM By: Alejandro Mulling Entered By: Alejandro Mulling on 06/20/2016 08:51:01 [name] ([id number]) -------------------------------------------------------------------------------- Vitals Details Patient Name: [name] Date of Service: 06/20/2016 8:30 AM Medical Record Number: [id number] Patient Account Number: [id number] Date of Birth/Sex: [birth year] (70 y.o. Male) Treating RN: Phillis Haggis Primary Care Physician: Oretha Milch Other Clinician: Referring Physician: Oretha Milch Treating Physician/Extender: Rudene Re in Treatment: 24 Vital Signs Time Taken: 08:34 Temperature (F): 98.2 Height (in): 70 Pulse (bpm): 62 Weight (lbs): 235 Respiratory Rate (breaths/min): 18 Body Mass Index (BMI): 33.7 Blood Pressure (mmHg): 123/60 Reference Range: 80 - 120 mg / dl Electronic Signature(s) Signed: 06/20/2016 5:07:39 PM By: Alejandro Mulling Entered By: Alejandro Mulling on 06/20/2016 08:34:23

## 2016-06-27 ENCOUNTER — Encounter (HOSPITAL_BASED_OUTPATIENT_CLINIC_OR_DEPARTMENT_OTHER): Payer: Medicare Other | Admitting: General Surgery

## 2016-06-27 DIAGNOSIS — L89613 Pressure ulcer of right heel, stage 3: Secondary | ICD-10-CM | POA: Diagnosis not present

## 2016-06-27 DIAGNOSIS — E11621 Type 2 diabetes mellitus with foot ulcer: Secondary | ICD-10-CM | POA: Diagnosis not present

## 2016-06-27 NOTE — Progress Notes (Signed)
See I heal note 

## 2016-06-27 NOTE — Progress Notes (Addendum)
[name], [name] ([id number]) Visit Report for 06/27/2016 Chief Complaint Document Details Patient Name: [name], [name] Date of Service: 06/27/2016 2:15 PM Medical Record Number: [id number] Patient Account Number: [id number] Date of Birth/Sex: 1946-02-26 (70 y.o. Male) Treating RN: Phillis Haggis Primary Care Physician: Oretha Milch Other Clinician: Referring Physician: Oretha Milch Treating Physician/Extender: Elayne Snare in Treatment: 25 Information Obtained from: Patient Chief Complaint Patient is at the clinic for treatment of an open pressure ulcer the left upper thigh and gluteal region and the right heel with bilateral swelling of the legs all of it which is going on for over a year Electronic Signature(s) Signed: 06/27/2016 2:10:28 PM By: Ardath Sax MD Entered By: Ardath Sax on 06/27/2016 14:10:28 [name] ([id number]) -------------------------------------------------------------------------------- HPI Details Patient Name: [name] Date of Service: 06/27/2016 2:15 PM Medical Record Number: [id number] Patient Account Number: [id number] Date of Birth/Sex: 1946-08-27 (70 y.o. Male) Treating RN: Phillis Haggis Primary Care Physician: Oretha Milch Other Clinician: Referring Physician: Oretha Milch Treating Physician/Extender: Ardath Sax Weeks in Treatment: 25 History of Present Illness Location: ulcerated area on the right heel, left gluteal region and thigh and then bilateral lower extremities Quality: Patient reports experiencing a sharp pain to affected area(s). Severity: Patient states wound are getting worse. Duration: Patient has had the wound for > 12 months prior to seeking treatment at the wound center Timing: Pain in wound is constant (hurts all the time) Context: The wound appeared gradually over time Modifying Factors: Other treatment(s) tried include: he sees his heart doctor and his primary care doctor Associated Signs and  Symptoms: patient has not been able to walk for over a year now HPI Description: 70 year old gentleman with a known history of hypertension, diabetes, obstructive sleep apnea, COPD, diastolic CHF, coronary artery disease was admitted to the hospital with sepsis from an ulcer of the right heel and was treated there in October 2016. he also has chronic bilateral lower extremity edema and lymphedema. He had received vancomycin, Zosyn and at that stage and x-rays showed hardware in the right ankle but no evidence of osteomyelitis. he was a former smoker. he was also treated with Augmentin orally for 10 days. He is either bedbound or wheelchair-bound and does not ambulate by himself. 01/19/2016 -- he has not been seen here for 3 weeks and this was because he was admitted to [facility] between 223 and 01/08/2016 for sepsis, UTI and pneumonia involving the left lung. he was treated with IV vancomycin and Zosyn and then meropenem. Was discharged home on oral Bactrim for 2 weeks none of his vascular test or x-rays were done and we will reorder these. 01/26/2016 -- he has not yet done the x-ray of his foot and is vascular tests are still pending. I have asked him to work on these with his nursing home staff. Addendum: he has got an x-ray of the right foot done which shows that his osteopenia but no specific ostial lysis or abnormal periosteal reaction. Final impression was degenerative changes with dorsal foot soft tissue swelling. 02/16/2016 -- lower extremity arterial duplex examination shows a 50-99% stenosis of the right tibioperoneal trunk. He had biphasic flow in the right SFA, popliteal and tibioperoneal trunk. Left-sided he had triphasic flow throughout 02/29/2016 -- he is awaiting his vascular opinion with Dr. Gilda Crease which is to be done on April 24 03/08/2016 -- he has not kept his appointment with Dr. Gilda Crease on April 24 and does not seem to know what happened about  this. it's difficult to gauge whether he is in full control of his mental faculties as at times he is extremely rude to the nursing staff. 03/22/2016 -- the patient was seen by Dr. 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 Recommended that the patient had severe atherosclerotic changes of both lower extremities associated with ulceration and tissue loss of the foot. This is a limb threatening ischemia and the patient was recommended to undergo Ensey, [name]. ([id number]) angiography of the lower extremity with a hope for intervention for limb salvage. Patient agreed and will proceed to angiography. He was admitted to the hospital between May 2 and 03/15/2016 - he underwent induction of a catheter into his right lower extremity and third order catheter placement with contrast injection to the right lower extremity for distal runoff. Percutaneous transluminal angioplasty of the right superficial femoral and popliteal arteries were done. He also had a right peroneal angioplasty. Patient had a postoperative hematoma and was admitted for observation and in the next 24 hours he was very agitated and combative and had to be seen by psychiatric. He had a follow-up with Dr. Gilda CreaseSchnier in 3 weeks. 04/18/2016 -- notes reviewed from the vascular office where he was seen for his postop visit after the PTA of the right SFA and popliteal arteries on 03/12/2016. He underwent an ABI which showed right ABI to be more than 1.3 and left to be more than 1 more than 1.3, great toe and PPG waveforms are decreased bilaterally. No additional intervention indicated at this time and the patient was to follow-up in 3 months with an ABI and bilateral lower extremity duplex study. 05/02/2016 -- he complains that his compression stockings are causing him a lot of discomfort and he is not happy wearing  them. 05/30/2016 -- the swelling of both legs has increased again and he has had blisters which have opened out into ulcerations. Electronic Signature(s) Signed: 06/27/2016 2:12:08 PM By: Ardath SaxParker, Zyshawn Bohnenkamp MD Entered By: Ardath SaxParker, Carden Teel on 06/27/2016 14:12:08 [name] OttoLINDLEY, [name] ([id number]) -------------------------------------------------------------------------------- Physical Exam Details Patient Name: [name] OttoLINDLEY, [name] Date of Service: 06/27/2016 2:15 PM Medical Record Number: [id number] Patient Account Number: [id number] Date of Birth/Sex: Aug 09, 1946 [age] y.o. Male) Treating RN: Phillis HaggisPinkerton, Debi Primary Care Physician: Oretha MilchSMITH, [name] Other Clinician: Referring Physician: Oretha MilchSMITH, [name] Treating Physician/Extender: Elayne SnarePARKER, Maudie Shingledecker Weeks in Treatment: 25 Electronic Signature(s) Signed: 06/27/2016 2:12:24 PM By: Ardath SaxParker, Adelae Yodice MD Entered By: Ardath SaxParker, Vaidehi Braddy on 06/27/2016 14:12:24 [name] OttoLINDLEY, [name] ([id number]) -------------------------------------------------------------------------------- Physician Orders Details Patient Name: [name] OttoLINDLEY, [name] Date of Service: 06/27/2016 2:15 PM Medical Record Number: [id number] Patient Account Number: [id number] Date of Birth/Sex: Aug 09, 1946 [age] y.o. Male) Treating RN: Phillis HaggisPinkerton, Debi Primary Care Physician: Oretha MilchSMITH, [name] Other Clinician: Referring Physician: Oretha MilchSMITH, [name] Treating Physician/Extender: Elayne SnarePARKER, Chadwick Reiswig Weeks in Treatment: 25 Verbal / Phone Orders: Yes Clinician: Ashok CordiaPinkerton, Debi Read Back and Verified: Yes Diagnosis Coding ICD-10 Coding Code Description E11.621 Type 2 diabetes mellitus with foot ulcer L89.613 Pressure ulcer of right heel, stage 3 E66.01 Morbid (severe) obesity due to excess calories I89.0 Lymphedema, not elsewhere classified M70.871 Other soft tissue disorders related to use, overuse and pressure, right ankle and foot 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 o Other: - Please do sink or bed baths **do not get leg wraps wet** Wound #2 Right Lower Leg   o No tub bath. o Other: - Please do sink or bed baths **do not get leg wraps wet** Wound #3 Right Calcaneus o No tub bath. o Other: - Please do sink or bed baths **do not get leg wraps wet** Anesthetic Wound #1 Left Lower Leg o Topical Lidocaine 4% cream applied to wound bed prior to debridement - for clinic use only o Topical Lidocaine 4% cream applied to wound bed prior to debridement - for clinic use only Wound #2 Right Lower Leg o Topical Lidocaine 4% cream applied to wound bed prior to debridement - for clinic use only o Topical Lidocaine 4% cream applied to wound bed prior to debridement - for clinic use only Wound #3 Right Calcaneus o Topical Lidocaine 4% cream applied to wound bed prior to debridement - for clinic use only Guajardo, Odas [name]. ([id number]) o Topical Lidocaine 4% cream applied to wound bed prior to debridement - for clinic use only Skin Barriers/Peri-Wound Care Wound #1 Left Lower Leg o Barrier cream - around the heel wound to reddened and irritated areas Wound #2 Right Lower Leg o Barrier cream - around the heel wound to reddened and irritated areas Wound #3 Right Calcaneus o Barrier cream - around the heel wound to reddened and irritated areas Primary Wound Dressing Wound #1 Left Lower Leg o Aquacel Ag - Please wet with saline before removing Wound #2 Right Lower Leg o Aquacel Ag - Please wet with saline before removing Wound #3 Right Calcaneus o Aquacel Ag - silver alginate Secondary Dressing Wound #1 Left Lower Leg o ABD pad Wound #2 Right Lower Leg o ABD pad Wound #3 Right Calcaneus o ABD pad o Dry Gauze o Conform/Kerlix Dressing Change Frequency Wound #1 Left Lower Leg o Change dressing every week - at the wound clinic Wound #2 Right Lower Leg o Change dressing every week  - at the wound clinic Wound #3 Right Calcaneus o Change dressing every other day. - to be changed every other day by SN[facility] RN Follow-up Appointments Wound #1 Left Lower Leg o Return Appointment in 1 week. MANUAL, NAVARRA VMarland Kitchen ([id number]) Wound #2 Right Lower Leg o Return Appointment in 1 week. Wound #3 Right Calcaneus o Return Appointment in 1 week. Edema Control Wound #1 Left Lower Leg o 3 Layer Compression System - Bilateral o Elevate legs to the level of the heart and pump ankles as often as possible Wound #2 Right Lower Leg o 3 Layer Compression System - Bilateral o Elevate legs to the level of the heart and pump ankles as often as possible Off-Loading Wound #1 Left Lower Leg o Turn and reposition every 2 hours o Other: - sage boots at night and float heel when lying in bed Wound #2 Right Lower Leg o Turn and reposition every 2 hours o Other: - sage boots at night and float heel when lying in bed Wound #3 Right Calcaneus o Turn and reposition every 2 hours o Other: - sage boots at night and float heel when lying in bed Additional Orders / Instructions Wound #1 Left Lower Leg o Increase protein intake. Wound #2 Right Lower Leg o Increase protein intake. Wound #3 Right Calcaneus o Increase protein intake. Medications-please add to medication list. Wound #1 Left Lower Leg o Other: - Vitamin C, Vitamin A, Zinc, multivitamin Wound #2 Right Lower Leg o Other: - Vitamin C, Vitamin A, Zinc, multivitamin Raboin, Caz [name]. ([id number]) Wound #3 Right Calcaneus o Other: - Vitamin C, Vitamin A, Zinc, multivitamin  Electronic Signature(s) Signed: 06/27/2016 4:58:27 PM By: Alejandro MullingPinkerton, Debra Signed: 06/28/2016 8:09:12 AM By: Ardath SaxParker, Kiel Cockerell MD Entered By: Alejandro MullingPinkerton, Debra on 06/27/2016 14:38:55 [name] OttoLINDLEY, [name] ([id number]) -------------------------------------------------------------------------------- Problem List Details Patient Name: [name] OttoLINDLEY,  [name] Date of Service: 06/27/2016 2:15 PM Medical Record Number: [id number] Patient Account Number: [id number] Date of Birth/Sex: October 08, 1946 [age] y.o. Male) Treating RN: Phillis HaggisPinkerton, Debi Primary Care Physician: Oretha MilchSMITH, [name] Other Clinician: Referring Physician: Oretha MilchSMITH, [name] Treating Physician/Extender: Elayne SnarePARKER, Mckenzie Toruno Weeks in Treatment: 25 Active Problems ICD-10 Encounter Code Description Active Date Diagnosis E11.621 Type 2 diabetes mellitus with foot ulcer 01/01/2016 Yes L89.613 Pressure ulcer of right heel, stage 3 01/01/2016 Yes E66.01 Morbid (severe) obesity due to excess calories 01/01/2016 Yes I89.0 Lymphedema, not elsewhere classified 01/01/2016 Yes M70.871 Other soft tissue disorders related to use, overuse and 01/19/2016 Yes pressure, right ankle and foot 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/16/2016 Yes ulceration of heel and midfoot Inactive Problems Resolved Problems ICD-10 Code Description Active Date Resolved Date L89.322 Pressure ulcer of left buttock, stage 2 01/01/2016 01/01/2016 Electronic Signature(s) Signed: 06/27/2016 2:09:46 PM By: Ardath SaxParker, Josalin Carneiro MD Entered By: Ardath SaxParker, Crystalee Ventress on 06/27/2016 14:09:44 [name] OttoLINDLEY, [name] ([id number]) Clint GuyLINDLEY, [name] VMarland Kitchen. ([id number]) -------------------------------------------------------------------------------- Progress Note Details Patient Name: [name] OttoLINDLEY, [name] Date of Service: 06/27/2016 2:15 PM Medical Record Number: [id number] Patient Account Number: [id number] Date of Birth/Sex: October 08, 1946 [age] y.o. Male) Treating RN: Phillis HaggisPinkerton, Debi Primary Care Physician: Oretha MilchSMITH, [name] Other Clinician: Referring Physician: Oretha MilchSMITH, [name] Treating Physician/Extender: Elayne SnarePARKER, Bonnell Placzek Weeks in Treatment: 25 Subjective Chief Complaint Information obtained from Patient Patient is at the clinic for treatment of an open pressure ulcer the left upper thigh and gluteal region and the right heel with bilateral swelling of the legs all of  it which is going on for over a year History of Present Illness (HPI) The following HPI elements were documented for the patient's wound: Location: ulcerated area on the right heel, left gluteal region and thigh and then bilateral lower extremities Quality: Patient reports experiencing a sharp pain to affected area(s). Severity: Patient states wound are getting worse. Duration: Patient has had the wound for > 12 months prior to seeking treatment at the wound center Timing: Pain in wound is constant (hurts all the time) Context: The wound appeared gradually over time Modifying Factors: Other treatment(s) tried include: he sees his heart doctor and his primary care doctor Associated Signs and Symptoms: patient has not been able to walk for over a year now 70 year old gentleman with a known history of hypertension, diabetes, obstructive sleep apnea, COPD, diastolic CHF, coronary artery disease was admitted to the hospital with sepsis from an ulcer of the right heel and was treated there in October 2016. he also has chronic bilateral lower extremity edema and lymphedema. He had received vancomycin, Zosyn and at that stage and x-rays showed hardware in the right ankle but no evidence of osteomyelitis. he was a former smoker. he was also treated with Augmentin orally for 10 days. He is either bedbound or wheelchair-bound and does not ambulate by himself. 01/19/2016 -- he has not been seen here for 3 weeks and this was because he was admitted [facility] between 223 and 01/08/2016 for sepsis, UTI and pneumonia involving the left lung. he was treated with IV vancomycin and Zosyn and then meropenem. Was discharged home on oral Bactrim for 2 weeks none of his vascular test or x-rays were done and we will reorder these. 01/26/2016 -- he has not yet done the  x-ray of his foot and is vascular tests are still pending. I have asked him to work on these with his nursing home  staff. Addendum: he has got an x-ray of the right foot done which shows that his osteopenia but no specific ostial lysis or abnormal periosteal reaction. Final impression was degenerative changes with dorsal foot soft tissue swelling. 02/16/2016 -- lower extremity arterial duplex examination shows a 50-99% stenosis of the right tibioperoneal trunk. He had biphasic flow in the right SFA, popliteal and tibioperoneal trunk. Left-sided he had triphasic flow throughout MAJID, [name] ([id number]) 02/29/2016 -- he is awaiting his vascular opinion with Dr. Gilda Crease which is to be done on April 24 03/08/2016 -- he has not kept his appointment with Dr. Gilda Crease on April 24 and does not seem to know what happened about this. it's difficult to gauge whether he is in full control of his mental faculties as at times he is extremely rude to the nursing staff. 03/22/2016 -- the patient was seen by Dr. 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 Recommended that the patient had severe atherosclerotic changes of both lower extremities associated with ulceration and tissue loss of the foot. This is a limb threatening ischemia and the patient was recommended to undergo angiography of the lower extremity with a hope for intervention for limb salvage. Patient agreed and will proceed to angiography. He was admitted to the hospital between May 2 and 03/15/2016 - he underwent induction of a catheter into his right lower extremity and third order catheter placement with contrast injection to the right lower extremity for distal runoff. Percutaneous transluminal angioplasty of the right superficial femoral and popliteal arteries were done. He also had a right peroneal angioplasty. Patient had a postoperative hematoma and was admitted for observation and in the next 24 hours he was very agitated and  combative and had to be seen by psychiatric. He had a follow-up with Dr. Gilda Crease in 3 weeks. 04/18/2016 -- notes reviewed from the vascular office where he was seen for his postop visit after the PTA of the right SFA and popliteal arteries on 03/12/2016. He underwent an ABI which showed right ABI to be more than 1.3 and left to be more than 1 more than 1.3, great toe and PPG waveforms are decreased bilaterally. No additional intervention indicated at this time and the patient was to follow-up in 3 months with an ABI and bilateral lower extremity duplex study. 05/02/2016 -- he complains that his compression stockings are causing him a lot of discomfort and he is not happy wearing them. 05/30/2016 -- the swelling of both legs has increased again and he has had blisters which have opened out into ulcerations. Objective Constitutional Vitals Time Taken: 2:08 PM, Height: 70 in, Weight: 235 lbs, BMI: 33.7, Temperature: 98.3 F, Pulse: 75 bpm, Respiratory Rate: 18 breaths/min, Blood Pressure: 125/59 mmHg. Assessment Active Problems ICD-10 E11.621 - Type 2 diabetes mellitus with foot ulcer Amparo, Asael [name]. ([id number]) L89.613 - Pressure ulcer of right heel, stage 3 E66.01 - Morbid (severe) obesity due to excess calories I89.0 - Lymphedema, not elsewhere classified M70.871 - Other soft tissue disorders related to use, overuse and pressure, right ankle and foot I70.234 - Atherosclerosis of native arteries of right leg with ulceration of heel and midfoot Plan Obese man with venous stasis and pressure ulcer right heel stage 3. Treat with silver alginate to heel  and 3 layer wrap to both legs Electronic Signature(s) Signed: 06/27/2016 2:35:23 PM By: Ardath Sax MD Entered By: Ardath Sax on 06/27/2016 14:35:23 [name] ([id number]) -------------------------------------------------------------------------------- SuperBill Details Patient Name: [name] Date of Service:  06/27/2016 Medical Record Number: [id number] Patient Account Number: [id number] Date of Birth/Sex: 08-Nov-1946 (70 y.o. Male) Treating RN: Phillis Haggis Primary Care Physician: Oretha Milch Other Clinician: Referring Physician: Oretha Milch Treating Physician/Extender: Ardath Sax Weeks in Treatment: 25 Diagnosis Coding ICD-10 Codes Code Description E11.621 Type 2 diabetes mellitus with foot ulcer L89.613 Pressure ulcer of right heel, stage 3 E66.01 Morbid (severe) obesity due to excess calories I89.0 Lymphedema, not elsewhere classified M70.871 Other soft tissue disorders related to use, overuse and pressure, right ankle and foot 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 Facility Procedures CPT4: Description Modifier Quantity Code [id number] BILATERAL: Application of multi-layer venous compression 1 system; leg (below knee), including ankle and foot. Physician Procedures CPT4 Code: [id number] Description: 99213 - WC PHYS LEVEL 3 - EST PT ICD-10 Description Diagnosis L89.613 Pressure ulcer of right heel, stage 3 I89.0 Lymphedema, not elsewhere classified Modifier: Quantity: 1 Electronic Signature(s) Signed: 06/27/2016 4:58:27 PM By: Alejandro Mulling Signed: 06/28/2016 8:09:12 AM By: Ardath Sax MD Previous Signature: 06/27/2016 2:36:08 PM Version By: Ardath Sax MD Entered By: Alejandro Mulling on 06/27/2016 15:06:35

## 2016-06-28 NOTE — Progress Notes (Signed)
[name], [name] ([id number]) Visit Report for 06/27/2016 Arrival Information Details Patient Name: [name], [name] Date of Service: 06/27/2016 2:15 PM Medical Record Number: [id number] Patient Account Number: [id number] Date of Birth/Sex: [birth year] (70 y.o. Male) Treating RN: Phillis Haggis Primary Care Physician: Oretha Milch Other Clinician: Referring Physician: Oretha Milch Treating Physician/Extender: Elayne Snare in Treatment: 25 Visit Information History Since Last Visit All ordered tests and consults were completed: No Patient Arrived: Wheel Chair Added or deleted any medications: No Arrival Time: 14:07 Any new allergies or adverse reactions: No Accompanied By: self Had a fall or experienced change in No Transfer Assistance: EasyP[employer] activities of daily living that may affect Patient Lift risk of falls: Patient Identification Verified: Yes Signs or symptoms of abuse/neglect since last No Secondary Verification Process Yes visito Completed: Hospitalized since last visit: No Patient Requires Transmission- No Pain Present Now: No Based Precautions: Patient Has Alerts: Yes Patient Alerts: DM II Electronic Signature(s) Signed: 06/27/2016 4:58:27 PM By: Alejandro Mulling Entered By: Alejandro Mulling on 06/27/2016 14:08:41 [name] ([id number]) -------------------------------------------------------------------------------- Encounter Discharge Information Details Patient Name: [name] Date of Service: 06/27/2016 2:15 PM Medical Record Number: [id number] Patient Account Number: [id number] Date of Birth/Sex: 19-Aug-1946 (70 y.o. Male) Treating RN: Phillis Haggis Primary Care Physician: Oretha Milch Other Clinician: Referring Physician: Oretha Milch Treating Physician/Extender: Elayne Snare in Treatment: 25 Encounter Discharge Information Items Discharge Pain Level: 0 Discharge Condition: Stable Ambulatory Status: Wheelchair Discharge  Destination: Nursing Home Transportation: Other Accompanied By: self Schedule Follow-up Appointment: Yes Medication Reconciliation completed and provided to Patient/Care Yes [name]: Provided on Clinical Summary of Care: 06/27/2016 Form Type Recipient Paper Patient EL Electronic Signature(s) Signed: 06/27/2016 3:02:29 PM By: Gwenlyn Perking Previous Signature: 06/27/2016 2:36:45 PM Version By: Ardath Sax MD Entered By: Gwenlyn Perking on 06/27/2016 15:02:28 [name] ([id number]) -------------------------------------------------------------------------------- Lower Extremity Assessment Details Patient Name: [name] Date of Service: 06/27/2016 2:15 PM Medical Record Number: [id number] Patient Account Number: [id number] Date of Birth/Sex: 1946-06-10 (70 y.o. Male) Treating RN: Ashok Cordia, Debi Primary Care Physician: Oretha Milch Other Clinician: Referring Physician: Oretha Milch Treating Physician/Extender: Ardath Sax Weeks in Treatment: 25 Edema Assessment Assessed: [Left: No] [Right: No] E[Left: dema] [Right: :] Calf Left: Right: Point of Measurement: 34 cm From Medial Instep 38.4 cm 34.4 cm Ankle Left: Right: Point of Measurement: 12 cm From Medial Instep 25.2 cm 22.4 cm Vascular Assessment Pulses: Posterior Tibial Dorsalis Pedis Palpable: [Left:Yes] [Right:Yes] Extremity colors, hair growth, and conditions: Extremity Color: [Left:Hyperpigmented] [Right:Hyperpigmented] Temperature of Extremity: [Left:Warm] [Right:Warm] Capillary Refill: [Left:< 3 seconds] [Right:< 3 seconds] Toe Nail Assessment Left: Right: Thick: Yes Yes Discolored: Yes Yes Deformed: No No Improper Length and Hygiene: Yes Yes Electronic Signature(s) Signed: 06/27/2016 4:58:27 PM By: Alejandro Mulling Entered By: Alejandro Mulling on 06/27/2016 14:18:41 [name] Kitchen ([id number]) -------------------------------------------------------------------------------- Multi Wound Chart  Details Patient Name: [name] Date of Service: 06/27/2016 2:15 PM Medical Record Number: [id number] Patient Account Number: [id number] Date of Birth/Sex: 01-21-1946 (70 y.o. Male) Treating RN: Phillis Haggis Primary Care Physician: Oretha Milch Other Clinician: Referring Physician: Oretha Milch Treating Physician/Extender: Ardath Sax Weeks in Treatment: 25 Vital Signs Height(in): 70 Pulse(bpm): 75 Weight(lbs): 235 Blood Pressure 125/59 (mmHg): Body Mass Index(BMI): 34 Temperature(F): 98.3 Respiratory Rate 18 (breaths/min): Photos: [1:No Photos] [2:No Photos] [3:No Photos] Wound Location: [1:Left Lower Leg] [2:Right Lower Leg] [3:Right Calcaneus] Wounding Event: [1:Gradually Appeared] [2:Gradually Appeared] [3:Pressure Injury] Primary Etiology: [1:Venous Leg Ulcer] [2:Venous Leg Ulcer] [3:Pressure Ulcer] Comorbid History: [1:Cataracts, Chronic Obstructive  Pulmonary Disease (COPD), Sleep Disease (COPD), Sleep Disease (COPD), Sleep Apnea, Angina, Congestive Heart Failure, Congestive Heart Failure, Congestive Heart Failure, Hypertension, Type II Diabetes,  Gout, Osteoarthritis, Neuropathy Osteoarthritis, Neuropathy Osteoarthritis, Neuropathy] [2:Cataracts, Chronic Obstructive Pulmonary Apnea, Angina, Hypertension, Type II Diabetes, Gout,] [3:Cataracts, Chronic Obstructive Pulmonary Apnea, Angina,  Hypertension, Type II Diabetes, Gout,] Date Acquired: [1:12/31/2014] [2:12/31/2014] [3:10/31/2015] Weeks of Treatment: [1:25] [2:25] [3:25] Wound Status: [1:Open] [2:Open] [3:Open] Measurements L x W x D 3x4x0.1 [2:3x0.4x0.1] [3:0.9x1.6x0.2] (cm) Area (cm) : [1:9.425] [2:0.942] [3:1.131] Volume (cm) : [1:0.942] [2:0.094] [3:0.226] % Reduction in Area: [1:61.50%] [2:-299.20%] [3:84.00%] % Reduction in Volume: 61.60% [2:-291.70%] [3:84.00%] Classification: [1:Partial Thickness] [2:Partial Thickness] [3:Category/Stage III] HBO Classification: [1:Grade 1] [2:Grade 1] [3:Grade  1] Exudate Amount: [1:Large] [2:Large] [3:Large] Exudate Type: [1:Serous] [2:Serosanguineous] [3:Serous] Exudate Color: [1:amber] [2:red, brown] [3:amber] Wound Margin: [1:Flat and Intact] [2:Flat and Intact] [3:Flat and Intact] Granulation Amount: [1:Medium (34-66%)] [2:None Present (0%)] [3:Medium (34-66%)] Granulation Quality: [1:Pink] [2:N/A] [3:Red, Pink] Necrotic Amount: [1:Medium (34-66%)] [2:Large (67-100%)] [3:Medium (34-66%)] Necrotic Tissue: Eschar Eschar, Adherent Slough Adherent Slough Exposed Structures: Fascia: No Fascia: No Fascia: No Fat: No Fat: No Fat: No Tendon: No Tendon: No Tendon: No Muscle: No Muscle: No Muscle: No Joint: No Joint: No Joint: No Bone: No Bone: No Bone: No Limited to Skin Limited to Skin Limited to Skin Breakdown Breakdown Breakdown Epithelialization: Medium (34-66%) Medium (34-66%) None Periwound Skin Texture: Edema: Yes Edema: Yes Edema: Yes Periwound Skin Moist: Yes Moist: Yes Maceration: Yes Moisture: Maceration: No Moist: Yes Periwound Skin Color: Erythema: Yes Erythema: Yes Erythema: Yes Erythema Location: Circumferential Circumferential Circumferential Temperature: No Abnormality No Abnormality No Abnormality Tenderness on Yes Yes Yes Palpation: Wound Preparation: Ulcer Cleansing: Other: Ulcer Cleansing: Other: Ulcer Cleansing: soap and water soap and water Rinsed/Irrigated with Saline Topical Anesthetic Topical Anesthetic Applied: Other: lidocaine Applied: Other: lidocaine Topical Anesthetic 4% 4% Applied: Other: lidocaine 4% Treatment Notes Electronic Signature(s) Signed: 06/27/2016 4:58:27 PM By: Alejandro Mulling Entered By: Alejandro Mulling on 06/27/2016 14:32:04 [name] ([id number]) -------------------------------------------------------------------------------- Multi-Disciplinary Care Plan Details Patient Name: [name] Date of Service: 06/27/2016 2:15 PM Medical Record Number:  [id number] Patient Account Number: [id number] Date of Birth/Sex: July 01, 1946 (70 y.o. Male) Treating RN: Phillis Haggis Primary Care Physician: Oretha Milch Other Clinician: Referring Physician: Oretha Milch Treating Physician/Extender: Elayne Snare in Treatment: 25 Active Inactive Abuse / Safety / Falls / Self Care Management Nursing Diagnoses: Potential for falls Goals: Patient will remain injury free Date Initiated: 03/01/2016 Goal Status: Active Interventions: Assess fall risk on admission and as needed Notes: Nutrition Nursing Diagnoses: Imbalanced nutrition Goals: Patient/caregiver agrees to and verbalizes understanding of need to use nutritional supplements and/or vitamins as prescribed Date Initiated: 03/01/2016 Goal Status: Active Interventions: Assess patient nutrition upon admission and as needed per policy Notes: Orientation to the Wound Care Program Nursing Diagnoses: Knowledge deficit related to the wound healing center program Goals: Patient/caregiver will verbalize understanding of the [facility] [address] YONAS, BUNDA ([id number]) Date Initiated: 03/01/2016 Goal Status: Active Interventions: Provide education on orientation to the wound center Notes: Pain, Acute or Chronic Nursing Diagnoses: Pain, acute or chronic: actual or potential Potential alteration in comfort, pain Goals: Patient will verbalize adequate pain control and receive pain control interventions during procedures as needed Date Initiated: 03/01/2016 Goal Status: Active Patient/caregiver will verbalize adequate pain control between visits Date Initiated: 03/01/2016 Goal Status: Active Interventions: Assess comfort goal upon admission Complete pain assessment as per visit requirements Notes: Pressure Nursing Diagnoses: Knowledge deficit  related to causes and risk factors for pressure ulcer development Knowledge deficit related to management of pressures  ulcers Goals: Patient/caregiver will verbalize risk factors for pressure ulcer development Date Initiated: 03/01/2016 Goal Status: Active Interventions: Assess offloading mechanisms upon admission and as needed Notes: Wound/Skin Impairment Nursing Diagnoses: [name], [name] ([id number]) Impaired tissue integrity Goals: Ulcer/skin breakdown will have a volume reduction of 30% by week 4 Date Initiated: 03/01/2016 Goal Status: Active Ulcer/skin breakdown will have a volume reduction of 50% by week 8 Date Initiated: 03/01/2016 Goal Status: Active Ulcer/skin breakdown will have a volume reduction of 80% by week 12 Date Initiated: 03/01/2016 Goal Status: Active Interventions: Assess ulceration(s) every visit Notes: Electronic Signature(s) Signed: 06/27/2016 4:58:27 PM By: Alejandro MullingPinkerton, Debra Entered By: Alejandro MullingPinkerton, Debra on 06/27/2016 14:31:56 Carpino, Zeyad V. ([id number]) -------------------------------------------------------------------------------- Pain Assessment Details Patient Name: [name], [name] Date of Service: 06/27/2016 2:15 PM Medical Record Number: [id number] Patient Account Number: [id number] Date of Birth/Sex: October 03, 1946 [age] y.o. Male) Treating RN: Phillis HaggisPinkerton, Debi Primary Care Physician: Oretha MilchSMITH, [name] Other Clinician: Referring Physician: Oretha MilchSMITH, [name] Treating Physician/Extender: Elayne SnarePARKER, PETER Weeks in Treatment: 25 Active Problems Location of Pain Severity and Description of Pain Patient Has Paino No Site Locations With Dressing Change: No Pain Management and Medication Current Pain Management: Electronic Signature(s) Signed: 06/27/2016 4:58:27 PM By: Alejandro MullingPinkerton, Debra Entered By: Alejandro MullingPinkerton, Debra on 06/27/2016 14:08:53 [name], Ahmari V. ([id number]) -------------------------------------------------------------------------------- Patient/Caregiver Education Details Patient Name: [name], [name] Date of Service: 06/27/2016 2:15 PM Medical Record Number:  [id number] Patient Account Number: [id number] Date of Birth/Gender: October 03, 1946 [age] y.o. Male) Treating RN: Phillis HaggisPinkerton, Debi Primary Care Physician: Oretha MilchSMITH, [name] Other Clinician: Referring Physician: Oretha MilchSMITH, [name] Treating Physician/Extender: Elayne SnarePARKER, PETER Weeks in Treatment: 25 Education Assessment Education Provided To: Patient Education Topics Provided Wound/Skin Impairment: Handouts: Other: change dressing as ordered, do not get wraps wet Methods: Demonstration, Explain/Verbal Responses: State content correctly Electronic Signature(s) Signed: 06/28/2016 8:09:12 AM By: Ardath SaxParker, Peter MD Entered By: Ardath SaxParker, Peter on 06/27/2016 14:41:09 [name], [name] ([id number]) -------------------------------------------------------------------------------- Wound Assessment Details Patient Name: [name], [name] Date of Service: 06/27/2016 2:15 PM Medical Record Number: [id number] Patient Account Number: [id number] Date of Birth/Sex: October 03, 1946 [age] y.o. Male) Treating RN: Phillis HaggisPinkerton, Debi Primary Care Physician: Oretha MilchSMITH, [name] Other Clinician: Referring Physician: Oretha MilchSMITH, [name] Treating Physician/Extender: Ardath SaxPARKER, PETER Weeks in Treatment: 25 Wound Status Wound Number: 1 Primary Venous Leg Ulcer Etiology: Wound Location: Left Lower Leg Wound Open Wounding Event: Gradually Appeared Status: Date Acquired: 12/31/2014 Comorbid Cataracts, Chronic Obstructive Weeks Of Treatment: 25 History: Pulmonary Disease (COPD), Sleep Clustered Wound: No Apnea, Angina, Congestive Heart Failure, Hypertension, Type II Diabetes, Gout, Osteoarthritis, Neuropathy Wound Measurements Length: (cm) 3 Width: (cm) 4 Depth: (cm) 0.1 Area: (cm) 9.425 Volume: (cm) 0.942 % Reduction in Area: 61.5% % Reduction in Volume: 61.6% Epithelialization: Medium (34-66%) Tunneling: No Undermining: No Wound Description Classification: Partial Thickness Foul Odor Af Diabetic Severity (Wagner): Grade 1 Wound Margin: Flat and  Intact Exudate Amount: Large Exudate Type: Serous Exudate Color: amber ter Cleansing: No Wound Bed Granulation Amount: Medium (34-66%) Exposed Structure Granulation Quality: Pink Fascia Exposed: No Necrotic Amount: Medium (34-66%) Fat Layer Exposed: No Necrotic Quality: Eschar Tendon Exposed: No Muscle Exposed: No Joint Exposed: No Bone Exposed: No Limited to Skin Breakdown Periwound Skin Texture Texture Color No Abnormalities Noted: No No Abnormalities Noted: No Suriano, Demarques V. ([id number]) Localized Edema: Yes Erythema: Yes Erythema Location: Circumferential Moisture No Abnormalities Noted: No Temperature / Pain Moist: Yes Temperature: No Abnormality Tenderness on Palpation: Yes Wound Preparation Ulcer Cleansing: Other:  soap and water, Topical Anesthetic Applied: Other: lidocaine 4%, Treatment Notes Wound #1 (Left Lower Leg) 1. Cleansed with: Cleanse wound with antibacterial soap and water 2. Anesthetic Topical Lidocaine 4% cream to wound bed prior to debridement 4. Dressing Applied: Aquacel 5. Secondary Dressing Applied ABD Pad 7. Secured with Tape 3 Layer Compression System - Bilateral Electronic Signature(s) Signed: 06/27/2016 4:58:27 PM By: Alejandro MullingPinkerton, Debra Entered By: Alejandro MullingPinkerton, Debra on 06/27/2016 14:31:20 Clint GuyLINDLEY, Zabian [name] Kitchen. ([id number]) -------------------------------------------------------------------------------- Wound Assessment Details Patient Name: [name], [name] Date of Service: 06/27/2016 2:15 PM Medical Record Number: [id number] Patient Account Number: [id number] Date of Birth/Sex: Jul 18, 1946 [age] y.o. Male) Treating RN: Phillis HaggisPinkerton, Debi Primary Care Physician: Oretha MilchSMITH, [name] Other Clinician: Referring Physician: Oretha MilchSMITH, [name] Treating Physician/Extender: Ardath SaxPARKER, PETER Weeks in Treatment: 25 Wound Status Wound Number: 2 Primary Venous Leg Ulcer Etiology: Wound Location: Right Lower Leg Wound Open Wounding Event: Gradually  Appeared Status: Date Acquired: 12/31/2014 Comorbid Cataracts, Chronic Obstructive Weeks Of Treatment: 25 History: Pulmonary Disease (COPD), Sleep Clustered Wound: No Apnea, Angina, Congestive Heart Failure, Hypertension, Type II Diabetes, Gout, Osteoarthritis, Neuropathy Wound Measurements Length: (cm) 3 Width: (cm) 0.4 Depth: (cm) 0.1 Area: (cm) 0.942 Volume: (cm) 0.094 % Reduction in Area: -299.2% % Reduction in Volume: -291.7% Epithelialization: Medium (34-66%) Tunneling: No Undermining: No Wound Description Classification: Partial Thickness Diabetic Severity (Wagner): Grade 1 Wound Margin: Flat and Intact Exudate Amount: Large Exudate Type: Serosanguineous Exudate Color: red, brown Foul Odor After Cleansing: No Wound Bed Granulation Amount: None Present (0%) Exposed Structure Necrotic Amount: Large (67-100%) Fascia Exposed: No Necrotic Quality: Eschar, Adherent Slough Fat Layer Exposed: No Tendon Exposed: No Muscle Exposed: No Joint Exposed: No Bone Exposed: No Limited to Skin Breakdown Periwound Skin Texture Texture Color No Abnormalities Noted: No No Abnormalities Noted: No Azam, Vickey V. ([id number]) Localized Edema: Yes Erythema: Yes Erythema Location: Circumferential Moisture No Abnormalities Noted: No Temperature / Pain Maceration: No Temperature: No Abnormality Moist: Yes Tenderness on Palpation: Yes Wound Preparation Ulcer Cleansing: Other: soap and water, Topical Anesthetic Applied: Other: lidocaine 4%, Treatment Notes Wound #2 (Right Lower Leg) 1. Cleansed with: Cleanse wound with antibacterial soap and water 2. Anesthetic Topical Lidocaine 4% cream to wound bed prior to debridement 4. Dressing Applied: Aquacel 5. Secondary Dressing Applied ABD Pad 7. Secured with Tape 3 Layer Compression System - Bilateral Electronic Signature(s) Signed: 06/27/2016 4:58:27 PM By: Alejandro MullingPinkerton, Debra Entered By: Alejandro MullingPinkerton, Debra on 06/27/2016  14:31:46 Cotugno, Joshaua V. ([id number]) -------------------------------------------------------------------------------- Wound Assessment Details Patient Name: [name], [name] Date of Service: 06/27/2016 2:15 PM Medical Record Number: [id number] Patient Account Number: [id number] Date of Birth/Sex: Jul 18, 1946 [age] y.o. Male) Treating RN: Phillis HaggisPinkerton, Debi Primary Care Physician: Oretha MilchSMITH, [name] Other Clinician: Referring Physician: Oretha MilchSMITH, [name] Treating Physician/Extender: Ardath SaxPARKER, PETER Weeks in Treatment: 25 Wound Status Wound Number: 3 Primary Pressure Ulcer Etiology: Wound Location: Right Calcaneus Wound Open Wounding Event: Pressure Injury Status: Date Acquired: 10/31/2015 Comorbid Cataracts, Chronic Obstructive Weeks Of Treatment: 25 History: Pulmonary Disease (COPD), Sleep Clustered Wound: No Apnea, Angina, Congestive Heart Failure, Hypertension, Type II Diabetes, Gout, Osteoarthritis, Neuropathy Photos Photo Uploaded By: Alejandro MullingPinkerton, Debra on 06/27/2016 16:50:31 Wound Measurements Length: (cm) 0.9 Width: (cm) 1.6 Depth: (cm) 0.2 Area: (cm) 1.131 Volume: (cm) 0.226 % Reduction in Area: 84% % Reduction in Volume: 84% Epithelialization: None Tunneling: No Undermining: No Wound Description Classification: Category/Stage III Foul Odor A Diabetic Severity (Wagner): Grade 1 Wound Margin: Flat and Intact Exudate Amount: Large Exudate Type: Serous Exudate Color: amber fter Cleansing: No Wound Bed Granulation Amount: Medium (34-66%)  Exposed Structure Viswanathan, Leno V. ([id number]) Granulation Quality: Red, Pink Fascia Exposed: No Necrotic Amount: Medium (34-66%) Fat Layer Exposed: No Necrotic Quality: Adherent Slough Tendon Exposed: No Muscle Exposed: No Joint Exposed: No Bone Exposed: No Limited to Skin Breakdown Periwound Skin Texture Texture Color No Abnormalities Noted: No No Abnormalities Noted: No Localized Edema: Yes Erythema: Yes Erythema Location:  Circumferential Moisture No Abnormalities Noted: No Temperature / Pain Maceration: Yes Temperature: No Abnormality Moist: Yes Tenderness on Palpation: Yes Wound Preparation Ulcer Cleansing: Rinsed/Irrigated with Saline Topical Anesthetic Applied: Other: lidocaine 4%, Treatment Notes Wound #3 (Right Calcaneus) 1. Cleansed with: Cleanse wound with antibacterial soap and water 2. Anesthetic Topical Lidocaine 4% cream to wound bed prior to debridement 4. Dressing Applied: Aquacel Ag 5. Secondary Dressing Applied ABD Pad Dry Gauze Kerlix/Conform 7. Secured with Secretary/administrator) Signed: 06/27/2016 4:58:27 PM By: Alejandro Mulling Entered By: Alejandro Mulling on 06/27/2016 14:27:19 [name] ([id number]) -------------------------------------------------------------------------------- Vitals Details Patient Name: [name] Date of Service: 06/27/2016 2:15 PM Medical Record Number: [id number] Patient Account Number: [id number] Date of Birth/Sex: 08-05-1946 (70 y.o. Male) Treating RN: Phillis Haggis Primary Care Physician: Oretha Milch Other Clinician: Referring Physician: Oretha Milch Treating Physician/Extender: Ardath Sax Weeks in Treatment: 25 Vital Signs Time Taken: 14:08 Temperature (F): 98.3 Height (in): 70 Pulse (bpm): 75 Weight (lbs): 235 Respiratory Rate (breaths/min): 18 Body Mass Index (BMI): 33.7 Blood Pressure (mmHg): 125/59 Reference Range: 80 - 120 mg / dl Electronic Signature(s) Signed: 06/27/2016 4:58:27 PM By: Alejandro Mulling Entered By: Alejandro Mulling on 06/27/2016 14:09:19

## 2016-07-04 ENCOUNTER — Encounter: Payer: Medicare Other | Admitting: Surgery

## 2016-07-04 DIAGNOSIS — E11621 Type 2 diabetes mellitus with foot ulcer: Secondary | ICD-10-CM | POA: Diagnosis not present

## 2016-07-05 NOTE — Progress Notes (Signed)
VU, LIEBMAN ([id number]) Visit Report for 07/04/2016 Chief Complaint Document Details Patient Name: [name], [name] Date of Service: 07/04/2016 2:45 PM Medical Record Number: [id number] Patient Account Number: [id number] Date of Birth/Sex: [birth year] (70 y.o. Male) Treating RN: Phillis Haggis Primary Care Physician: Oretha Milch Other Clinician: Referring Physician: Oretha Milch Treating Physician/Extender: Rudene Re in Treatment: 29 Information Obtained from: Patient Chief Complaint Patient is at the clinic for treatment of an open pressure ulcer the left upper thigh and gluteal region and the right heel with bilateral swelling of the legs all of it which is going on for over a year Electronic Signature(s) Signed: 07/04/2016 3:16:29 PM By: Evlyn Kanner MD, FACS Entered By: Evlyn Kanner on 07/04/2016 15:16:29 [name] ([id number]) -------------------------------------------------------------------------------- Debridement Details Patient Name: [name] Date of Service: 07/04/2016 2:45 PM Medical Record Number: [id number] Patient Account Number: [id number] Date of Birth/Sex: [birth year] (70 y.o. Male) Treating RN: Phillis Haggis Primary Care Physician: Oretha Milch Other Clinician: Referring Physician: Oretha Milch Treating Physician/Extender: Rudene Re in Treatment: 26 Debridement Performed for Wound #3 Right Calcaneus Assessment: Performed By: Physician Evlyn Kanner, MD Debridement: Debridement Pre-procedure Yes - 15:03 Verification/Time Out Taken: Start Time: 15:03 Pain Control: Lidocaine 4% Topical Solution Level: Skin/Subcutaneous Tissue Total Area Debrided (L x 0.5 (cm) x 2 (cm) = 1 (cm) W): Tissue and other Non-Viable, Callus, Exudate, Fibrin/Slough, Subcutaneous material debrided: Instrument: Curette Bleeding: Minimum Hemostasis Achieved: Pressure End Time: 15:08 Procedural Pain: 0 Post Procedural Pain: 0 Response to  Treatment: Procedure was tolerated well Post Debridement Measurements of Total Wound Length: (cm) 0.5 Stage: Category/Stage III Width: (cm) 2 Depth: (cm) 0.2 Volume: (cm) 0.157 Character of Wound/Ulcer Post Stable Debridement: Severity of Tissue Post Fat layer exposed Debridement: Post Procedure Diagnosis Same as Pre-procedure Electronic Signature(s) Signed: 07/04/2016 3:16:19 PM By: Evlyn Kanner MD, FACS Signed: 07/04/2016 5:32:44 PM By: Alejandro Mulling Entered By: Evlyn Kanner on 07/04/2016 15:16:19 [name], [name] ([id number]) [name], [name] ([id number]) -------------------------------------------------------------------------------- HPI Details Patient Name: [name] Date of Service: 07/04/2016 2:45 PM Medical Record Number: [id number] Patient Account Number: [id number] Date of Birth/Sex: Jul 03, 1946 (70 y.o. Male) Treating RN: Phillis Haggis Primary Care Physician: Oretha Milch Other Clinician: Referring Physician: Oretha Milch Treating Physician/Extender: Rudene Re in Treatment: 26 History of Present Illness Location: ulcerated area on the right heel, left gluteal region and thigh and then bilateral lower extremities Quality: Patient reports experiencing a sharp pain to affected area(s). Severity: Patient states wound are getting worse. Duration: Patient has had the wound for > 12 months prior to seeking treatment at the wound center Timing: Pain in wound is constant (hurts all the time) Context: The wound appeared gradually over time Modifying Factors: Other treatment(s) tried include: he sees his heart doctor and his primary care doctor Associated Signs and Symptoms: patient has not been able to walk for over a year now HPI Description: 70 year old gentleman with a known history of hypertension, diabetes, obstructive sleep apnea, COPD, diastolic CHF, coronary artery disease was admitted to the hospital with sepsis from an ulcer of the right heel  and was treated there in October 2016. he also has chronic bilateral lower extremity edema and lymphedema. He had received vancomycin, Zosyn and at that stage and x-rays showed hardware in the right ankle but no evidence of osteomyelitis. he was a former smoker. he was also treated with Augmentin orally for 10 days. He is either bedbound or wheelchair-bound and does not ambulate by himself. 01/19/2016 -- he  has not been seen here for 3 weeks and this was because he was admitted to [facility] between 223 and 01/08/2016 for sepsis, UTI and pneumonia involving the left lung. he was treated with IV vancomycin and Zosyn and then meropenem. Was discharged home on oral Bactrim for 2 weeks none of his vascular test or x-rays were done and we will reorder these. 01/26/2016 -- he has not yet done the x-ray of his foot and is vascular tests are still pending. I have asked him to work on these with his nursing home staff. Addendum: he has got an x-ray of the right foot done which shows that his osteopenia but no specific ostial lysis or abnormal periosteal reaction. Final impression was degenerative changes with dorsal foot soft tissue swelling. 02/16/2016 -- lower extremity arterial duplex examination shows a 50-99% stenosis of the right tibioperoneal trunk. He had biphasic flow in the right SFA, popliteal and tibioperoneal trunk. Left-sided he had triphasic flow throughout 02/29/2016 -- he is awaiting his vascular opinion with Dr. Gilda Crease which is to be done on April 24 03/08/2016 -- he has not kept his appointment with Dr. Gilda Crease on April 24 and does not seem to know what happened about this. it's difficult to gauge whether he is in full control of his mental faculties as at times he is extremely rude to the nursing staff. 03/22/2016 -- the patient was seen by Dr. 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 Recommended that the patient had severe atherosclerotic changes of both lower extremities associated with ulceration and tissue loss of the foot. This is a limb threatening ischemia and the patient was recommended to undergo [place], [place] [name]. ([id number]) angiography of the lower extremity with a hope for intervention for limb salvage. Patient agreed and will proceed to angiography. He was admitted to the hospital between May 2 and 03/15/2016 - he underwent induction of a catheter into his right lower extremity and third order catheter placement with contrast injection to the right lower extremity for distal runoff. Percutaneous transluminal angioplasty of the right superficial femoral and popliteal arteries were done. He also had a right peroneal angioplasty. Patient had a postoperative hematoma and was admitted for observation and in the next 24 hours he was very agitated and combative and had to be seen by psychiatric. He had a follow-up with Dr. Gilda Crease in 3 weeks. 04/18/2016 -- notes reviewed from the vascular office where he was seen for his postop visit after the PTA of the right SFA and popliteal arteries on 03/12/2016. He underwent an ABI which showed right ABI to be more than 1.3 and left to be more than 1 more than 1.3, great toe and PPG waveforms are decreased bilaterally. No additional intervention indicated at this time and the patient was to follow-up in 3 months with an ABI and bilateral lower extremity duplex study. 05/02/2016 -- he complains that his compression stockings are causing him a lot of discomfort and he is not happy wearing them. 05/30/2016 -- the swelling of both legs has increased again and he has had blisters which have opened out into ulcerations. Electronic Signature(s) Signed: 07/04/2016 3:16:38 PM By: Evlyn Kanner MD, FACS Entered By: Evlyn Kanner on 07/04/2016 15:16:38 [name]  ([id number]) -------------------------------------------------------------------------------- Physical Exam Details Patient Name: [name] Date of Service: 07/04/2016 2:45 PM Medical Record Number: [id number] Patient Account Number: [id number]  Date of Birth/Sex: July 22, 1946 (70 y.o. Male) Treating RN: Ashok Cordia, Debi Primary Care Physician: Oretha Milch Other Clinician: Referring Physician: Oretha Milch Treating Physician/Extender: Rudene Re in Treatment: 26 Constitutional . Pulse regular. Respirations normal and unlabored. Afebrile. . Eyes Nonicteric. Reactive to light. Ears, Nose, Mouth, and Throat Lips, teeth, and gums WNL.Marland Kitchen Moist mucosa without lesions. Neck supple and nontender. No palpable supraclavicular or cervical adenopathy. Normal sized without goiter. Respiratory WNL. No retractions.. Cardiovascular Pedal Pulses WNL. No clubbing, cyanosis or edema. Lymphatic No adneopathy. No adenopathy. No adenopathy. Musculoskeletal Adexa without tenderness or enlargement.. Digits and nails w/o clubbing, cyanosis, infection, petechiae, ischemia, or inflammatory conditions.. Integumentary (Hair, Skin) No suspicious lesions. No crepitus or fluctuance. No peri-wound warmth or erythema. No masses.Marland Kitchen Psychiatric Judgement and insight Intact.. No evidence of depression, anxiety, or agitation.. Notes both lower extremity still have lymphedema and there are some open areas which are weeping. He will continue to benefit from compression wraps. The wound on his right heel and calcaneus area has grown a lot of callus around and has subcutaneous debris and all of this was sharply removed with a #3 curet and brisk bleeding from the base of the ulcer was controlled with pressure Electronic Signature(s) Signed: 07/04/2016 3:17:39 PM By: Evlyn Kanner MD, FACS Entered By: Evlyn Kanner on 07/04/2016 15:17:39 [name]  ([id number]) -------------------------------------------------------------------------------- Physician Orders Details Patient Name: [name] Date of Service: 07/04/2016 2:45 PM Medical Record Number: [id number] Patient Account Number: [id number] Date of Birth/Sex: 10/22/1946 (70 y.o. Male) Treating RN: [name], RN, [name], [place] Primary Care Physician: Oretha Milch Other Clinician: Referring Physician: Oretha Milch Treating Physician/Extender: Rudene Re in Treatment: 51 Verbal / Phone Orders: Yes Clinician: [name], RN, [name], Rita Read Back and Verified: Yes Diagnosis Coding Wound Cleansing Wound #1 Left Lower Leg o No tub bath. o Other: - Please do sink or bed baths **do not get leg wraps wet** Wound #2 Right Lower Leg o No tub bath. o Other: - Please do sink or bed baths **do not get leg wraps wet** Wound #3 Right Calcaneus o No tub bath. o Other: - Please do sink or bed baths **do not get leg wraps wet** Anesthetic Wound #1 Left Lower Leg o Topical Lidocaine 4% cream applied to wound bed prior to debridement - for clinic use only o Topical Lidocaine 4% cream applied to wound bed prior to debridement - for clinic use only Wound #2 Right Lower Leg o Topical Lidocaine 4% cream applied to wound bed prior to debridement - for clinic use only o Topical Lidocaine 4% cream applied to wound bed prior to debridement - for clinic use only Wound #3 Right Calcaneus o Topical Lidocaine 4% cream applied to wound bed prior to debridement - for clinic use only o Topical Lidocaine 4% cream applied to wound bed prior to debridement - for clinic use only Skin Barriers/Peri-Wound Care Wound #1 Left Lower Leg o Barrier cream - around the heel wound to reddened and irritated areas Wound #2 Right Lower Leg o Barrier cream - around the heel wound to reddened and irritated areas Wound #3 Right Calcaneus [name], [name] ([id number]) o Barrier cream - around  the heel wound to reddened and irritated areas Primary Wound Dressing Wound #1 Left Lower Leg o Aquacel Ag - Please wet with saline before removing Wound #2 Right Lower Leg o Aquacel Ag - Please wet with saline before removing Wound #3 Right Calcaneus o Aquacel Ag - silver alginate Secondary Dressing Wound #  1 Left Lower Leg o ABD pad Wound #2 Right Lower Leg o ABD pad Wound #3 Right Calcaneus o ABD pad o Dry Gauze o Conform/Kerlix Dressing Change Frequency Wound #1 Left Lower Leg o Change dressing every week - at the wound clinic Wound #2 Right Lower Leg o Change dressing every week - at the wound clinic Wound #3 Right Calcaneus o Change dressing every other day. - to be changed every other day by SN[facility] RN Follow-up Appointments Wound #1 Left Lower Leg o Return Appointment in 1 week. Wound #2 Right Lower Leg o Return Appointment in 1 week. Wound #3 Right Calcaneus o Return Appointment in 1 week. Edema Control Wound #1 Left Lower Leg o 3 Layer Compression System - Bilateral [place], [place] [name]. ([id number]) o Elevate legs to the level of the heart and pump ankles as often as possible Wound #2 Right Lower Leg o 3 Layer Compression System - Bilateral o Elevate legs to the level of the heart and pump ankles as often as possible Off-Loading Wound #1 Left Lower Leg o Turn and reposition every 2 hours o Other: - sage boots at night and float heel when lying in bed Wound #2 Right Lower Leg o Turn and reposition every 2 hours o Other: - sage boots at night and float heel when lying in bed Wound #3 Right Calcaneus o Turn and reposition every 2 hours o Other: - sage boots at night and float heel when lying in bed Additional Orders / Instructions Wound #1 Left Lower Leg o Increase protein intake. Wound #2 Right Lower Leg o Increase protein intake. Wound #3 Right Calcaneus o Increase protein intake. Medications-please add  to medication list. Wound #1 Left Lower Leg o Other: - Vitamin C, Vitamin A, Zinc, multivitamin Wound #2 Right Lower Leg o Other: - Vitamin C, Vitamin A, Zinc, multivitamin Wound #3 Right Calcaneus o Other: - Vitamin C, Vitamin A, Zinc, multivitamin Electronic Signature(s) Signed: 07/04/2016 4:46:57 PM By: Evlyn Kanner MD, FACS Signed: 07/04/2016 5:25:53 PM By: Elpidio Eric [name], RN Entered By: Elpidio Eric on 07/04/2016 15:05:02 [name] ([id number]) [name], [name] VMarland Kitchen ([id number]) -------------------------------------------------------------------------------- Problem List Details Patient Name: [name] Date of Service: 07/04/2016 2:45 PM Medical Record Number: [id number] Patient Account Number: [id number] Date of Birth/Sex: 08/16/1946 (70 y.o. Male) Treating RN: Phillis Haggis Primary Care Physician: Oretha Milch Other Clinician: Referring Physician: Oretha Milch Treating Physician/Extender: Rudene Re in Treatment: 7 Active Problems ICD-10 Encounter Code Description Active Date Diagnosis E11.621 Type 2 diabetes mellitus with foot ulcer 01/01/2016 Yes L89.613 Pressure ulcer of right heel, stage 3 01/01/2016 Yes E66.01 Morbid (severe) obesity due to excess calories 01/01/2016 Yes I89.0 Lymphedema, not elsewhere classified 01/01/2016 Yes M70.871 Other soft tissue disorders related to use, overuse and 01/19/2016 Yes pressure, right ankle and foot 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/16/2016 Yes ulceration of heel and midfoot Inactive Problems Resolved Problems ICD-10 Code Description Active Date Resolved Date L89.322 Pressure ulcer of left buttock, stage 2 01/01/2016 01/01/2016 Electronic Signature(s) Signed: 07/04/2016 3:16:04 PM By: Evlyn Kanner MD, FACS Entered By: Evlyn Kanner on 07/04/2016 15:16:04 [name] ([id number]) [name], [name] VMarland Kitchen  ([id number]) -------------------------------------------------------------------------------- Progress Note Details Patient Name: [name] Date of Service: 07/04/2016 2:45 PM Medical Record Number: [id number] Patient Account Number: [id number] Date of Birth/Sex: [birth year] (69 y.o. Male) Treating RN: Phillis Haggis Primary Care Physician: Oretha Milch Other Clinician: Referring Physician: Oretha Milch Treating Physician/Extender: Rudene Re in Treatment: 26 Subjective  Chief Complaint Information obtained from Patient Patient is at the clinic for treatment of an open pressure ulcer the left upper thigh and gluteal region and the right heel with bilateral swelling of the legs all of it which is going on for over a year History of Present Illness (HPI) The following HPI elements were documented for the patient's wound: Location: ulcerated area on the right heel, left gluteal region and thigh and then bilateral lower extremities Quality: Patient reports experiencing a sharp pain to affected area(s). Severity: Patient states wound are getting worse. Duration: Patient has had the wound for > 12 months prior to seeking treatment at the wound center Timing: Pain in wound is constant (hurts all the time) Context: The wound appeared gradually over time Modifying Factors: Other treatment(s) tried include: he sees his heart doctor and his primary care doctor Associated Signs and Symptoms: patient has not been able to walk for over a year now 70 year old gentleman with a known history of hypertension, diabetes, obstructive sleep apnea, COPD, diastolic CHF, coronary artery disease was admitted to the hospital with sepsis from an ulcer of the right heel and was treated there in October 2016. he also has chronic bilateral lower extremity edema and lymphedema. He had received vancomycin, Zosyn and at that stage and x-rays showed hardware in the right ankle but no evidence of  osteomyelitis. he was a former smoker. he was also treated with Augmentin orally for 10 days. He is either bedbound or wheelchair-bound and does not ambulate by himself. 01/19/2016 -- he has not been seen here for 3 weeks and this was because he was admitted to [facility] between 223 and 01/08/2016 for sepsis, UTI and pneumonia involving the left lung. he was treated with IV vancomycin and Zosyn and then meropenem. Was discharged home on oral Bactrim for 2 weeks none of his vascular test or x-rays were done and we will reorder these. 01/26/2016 -- he has not yet done the x-ray of his foot and is vascular tests are still pending. I have asked him to work on these with his nursing home staff. Addendum: he has got an x-ray of the right foot done which shows that his osteopenia but no specific ostial lysis or abnormal periosteal reaction. Final impression was degenerative changes with dorsal foot soft tissue swelling. 02/16/2016 -- lower extremity arterial duplex examination shows a 50-99% stenosis of the right tibioperoneal trunk. He had biphasic flow in the right SFA, popliteal and tibioperoneal trunk. Left-sided he had triphasic flow throughout [name], [name] ([id number]) 02/29/2016 -- he is awaiting his vascular opinion with Dr. Gilda Crease which is to be done on April 24 03/08/2016 -- he has not kept his appointment with Dr. Gilda Crease on April 24 and does not seem to know what happened about this. it's difficult to gauge whether he is in full control of his mental faculties as at times he is extremely rude to the nursing staff. 03/22/2016 -- the patient was seen by Dr. 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 Recommended that the patient had severe atherosclerotic changes of both lower extremities associated with ulceration and tissue loss of the foot. This  is a limb threatening ischemia and the patient was recommended to undergo angiography of the lower extremity with a hope for intervention for limb salvage. Patient agreed and will proceed to angiography. He was admitted to the hospital between  May 2 and 03/15/2016 - he underwent induction of a catheter into his right lower extremity and third order catheter placement with contrast injection to the right lower extremity for distal runoff. Percutaneous transluminal angioplasty of the right superficial femoral and popliteal arteries were done. He also had a right peroneal angioplasty. Patient had a postoperative hematoma and was admitted for observation and in the next 24 hours he was very agitated and combative and had to be seen by psychiatric. He had a follow-up with Dr. Gilda CreaseSchnier in 3 weeks. 04/18/2016 -- notes reviewed from the vascular office where he was seen for his postop visit after the PTA of the right SFA and popliteal arteries on 03/12/2016. He underwent an ABI which showed right ABI to be more than 1.3 and left to be more than 1 more than 1.3, great toe and PPG waveforms are decreased bilaterally. No additional intervention indicated at this time and the patient was to follow-up in 3 months with an ABI and bilateral lower extremity duplex study. 05/02/2016 -- he complains that his compression stockings are causing him a lot of discomfort and he is not happy wearing them. 05/30/2016 -- the swelling of both legs has increased again and he has had blisters which have opened out into ulcerations. Objective Constitutional Pulse regular. Respirations normal and unlabored. Afebrile. Vitals Time Taken: 2:48 PM, Height: 70 in, Weight: 235 lbs, BMI: 33.7, Temperature: 98.2 F, Pulse: 69 bpm, Respiratory Rate: 18 breaths/min, Blood Pressure: 144/81 mmHg. Eyes Nonicteric. Reactive to light. Ears, Nose, Mouth, and Throat Lips, teeth, and gums WNL.Marland Kitchen. Moist mucosa without lesions. Neck [name],  [name] ([id number]) supple and nontender. No palpable supraclavicular or cervical adenopathy. Normal sized without goiter. Respiratory WNL. No retractions.. Cardiovascular Pedal Pulses WNL. No clubbing, cyanosis or edema. Lymphatic No adneopathy. No adenopathy. No adenopathy. Musculoskeletal Adexa without tenderness or enlargement.. Digits and nails w/o clubbing, cyanosis, infection, petechiae, ischemia, or inflammatory conditions.Marland Kitchen. Psychiatric Judgement and insight Intact.. No evidence of depression, anxiety, or agitation.. General Notes: both lower extremity still have lymphedema and there are some open areas which are weeping. He will continue to benefit from compression wraps. The wound on his right heel and calcaneus area has grown a lot of callus around and has subcutaneous debris and all of this was sharply removed with a #3 curet and brisk bleeding from the base of the ulcer was controlled with pressure Integumentary (Hair, Skin) No suspicious lesions. No crepitus or fluctuance. No peri-wound warmth or erythema. No masses.. Wound #1 status is Open. Original cause of wound was Gradually Appeared. The wound is located on the Left Lower Leg. The wound measures 0.1cm length x 0.1cm width x 0.1cm depth; 0.008cm^2 area and 0.001cm^3 volume. The wound is limited to skin breakdown. There is no tunneling or undermining noted. There is a large amount of serous drainage noted. The wound margin is flat and intact. There is no granulation within the wound bed. There is no necrotic tissue within the wound bed. The periwound skin appearance exhibited: Localized Edema, Moist, Erythema. The surrounding wound skin color is noted with erythema which is circumferential. Periwound temperature was noted as No Abnormality. The periwound has tenderness on palpation. Wound #2 status is Open. Original cause of wound was Gradually Appeared. The wound is located on the Right Lower Leg. The wound measures  0.1cm length x 0.1cm width x 0.1cm depth; 0.008cm^2 area and 0.001cm^3 volume. The wound is limited to skin breakdown. There is no tunneling or undermining noted. There is  a large amount of serosanguineous drainage noted. The wound margin is flat and intact. There is no granulation within the wound bed. There is no necrotic tissue within the wound bed. The periwound skin appearance exhibited: Localized Edema, Moist, Erythema. The periwound skin appearance did not exhibit: Maceration. The surrounding wound skin color is noted with erythema which is circumferential. Periwound temperature was noted as No Abnormality. The periwound has tenderness on palpation. Wound #3 status is Open. Original cause of wound was Pressure Injury. The wound is located on the Right Calcaneus. The wound measures 0.5cm length x 2cm width x 0.2cm depth; 0.785cm^2 area and 0.157cm^3 volume. The wound is limited to skin breakdown. There is no tunneling or undermining noted. There is a large amount of serous drainage noted. The wound margin is flat and intact. There is medium (34-66%) red, pink granulation within the wound bed. There is a medium (34-66%) amount of necrotic tissue within the wound bed including Adherent Slough. The periwound skin appearance exhibited: Elting, Gadiel [name]. ([id number]) Localized Edema, Maceration, Moist, Erythema. The surrounding wound skin color is noted with erythema which is circumferential. Periwound temperature was noted as No Abnormality. The periwound has tenderness on palpation. 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 There was a Skin/Subcutaneous Tissue Debridement ([id number]) debridement with total area of 1 sq cm performed by Evlyn Kanner, MD. with the following instrument(s): Curette to remove Non-Viable tissue/material including Exudate, Fibrin/Slough, Callus, and Subcutaneous after achieving pain control using Lidocaine 4% Topical Solution. A time out was conducted at 15:03, prior to the start of the procedure. A Minimum amount of bleeding was controlled with Pressure. The procedure was tolerated well with a pain level of 0 throughout and a pain level of 0 following the procedure. Post Debridement Measurements: 0.5cm length x 2cm width x 0.2cm depth; 0.157cm^3 volume. Post debridement Stage noted as Category/Stage III. Character of Wound/Ulcer Post Debridement is stable. Severity of Tissue Post Debridement is: Fat layer exposed. Post procedure Diagnosis Wound #3: Same as Pre-Procedure Plan Wound Cleansing: Wound #1 Left Lower Leg: No tub bath. [name], [name] ([id number]) Other: - Please do sink or bed baths **do not get leg wraps wet** Wound #2 Right Lower Leg: No tub bath. Other: - Please do sink or bed baths **do not get leg wraps wet** Wound #3 Right Calcaneus: No tub bath. Other: - Please do sink or bed baths **do not get leg wraps wet** Anesthetic: Wound #1 Left Lower Leg: Topical Lidocaine 4% cream applied to wound bed prior to debridement - for clinic use only Topical Lidocaine 4% cream applied to wound bed prior to debridement - for clinic use only Wound #2 Right Lower Leg: Topical Lidocaine 4% cream applied to wound bed prior to debridement - for clinic use only Topical Lidocaine 4% cream applied to wound bed prior to debridement - for clinic use only Wound #3 Right Calcaneus: Topical Lidocaine 4% cream applied to wound bed prior to debridement - for clinic use only Topical Lidocaine  4% cream applied to wound bed prior to debridement -  for clinic use only Skin Barriers/Peri-Wound Care: Wound #1 Left Lower Leg: Barrier cream - around the heel wound to reddened and irritated areas Wound #2 Right Lower Leg: Barrier cream - around the heel wound to reddened and irritated areas Wound #3 Right Calcaneus: Barrier cream - around the heel wound to reddened and irritated areas Primary Wound Dressing: Wound #1 Left Lower Leg: Aquacel Ag - Please wet with saline before removing Wound #2 Right Lower Leg: Aquacel Ag - Please wet with saline before removing Wound #3 Right Calcaneus: Aquacel Ag - silver alginate Secondary Dressing: Wound #1 Left Lower Leg: ABD pad Wound #2 Right Lower Leg: ABD pad Wound #3 Right Calcaneus: ABD pad Dry Gauze Conform/Kerlix Dressing Change Frequency: Wound #1 Left Lower Leg: Change dressing every week - at the wound clinic Wound #2 Right Lower Leg: Change dressing every week - at the wound clinic Wound #3 Right Calcaneus: Change dressing every other day. - to be changed every other day by [facility] RN Follow-up Appointments: Wound #1 Left Lower Leg: Return Appointment in 1 week. Wound #2 Right Lower Leg: [name], [name] ([id number]) Return Appointment in 1 week. Wound #3 Right Calcaneus: Return Appointment in 1 week. Edema Control: Wound #1 Left Lower Leg: 3 Layer Compression System - Bilateral Elevate legs to the level of the heart and pump ankles as often as possible Wound #2 Right Lower Leg: 3 Layer Compression System - Bilateral Elevate legs to the level of the heart and pump ankles as often as possible Off-Loading: Wound #1 Left Lower Leg: Turn and reposition every 2 hours Other: - sage boots at night and float heel when lying in bed Wound #2 Right Lower Leg: Turn and reposition every 2 hours Other: - sage boots at night and float heel when lying in bed Wound #3 Right Calcaneus: Turn and reposition every 2 hours Other: - sage  boots at night and float heel when lying in bed Additional Orders / Instructions: Wound #1 Left Lower Leg: Increase protein intake. Wound #2 Right Lower Leg: Increase protein intake. Wound #3 Right Calcaneus: Increase protein intake. Medications-please add to medication list.: Wound #1 Left Lower Leg: Other: - Vitamin C, Vitamin A, Zinc, multivitamin Wound #2 Right Lower Leg: Other: - Vitamin C, Vitamin A, Zinc, multivitamin Wound #3 Right Calcaneus: Other: - Vitamin C, Vitamin A, Zinc, multivitamin I have recommended: 1. Silver alginte AG to his right heel. 2. Constant off loading and also using Sage boot. 3. we will use Silver alginate and 3 layer Profore compressions for both lower extremities. 4. High-protein diet and multivitamins including vitamin C and zinc. Electronic Signature(s) Signed: 07/04/2016 3:18:28 PM By: Evlyn Kanner MD, FACS [name], [name] ([id number]) Entered By: Evlyn Kanner on 07/04/2016 15:18:28 [name] ([id number]) -------------------------------------------------------------------------------- SuperBill Details Patient Name: [name] Date of Service: 07/04/2016 Medical Record Number: [id number] Patient Account Number: [id number] Date of Birth/Sex: 11/23/1945 (70 y.o. 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, overuse and pressure, right ankle and foot 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 Facility Procedures CPT4: Description Modifier Quantity Code  [id number] 11042 -  DEB SUBQ TISSUE 20 SQ CM/< 1 ICD-10 Description Diagnosis E11.621 Type 2 diabetes mellitus with foot ulcer L89.613 Pressure ulcer of right heel, stage 3 I89.0 Lymphedema, not elsewhere classified  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

## 2016-07-05 NOTE — Progress Notes (Signed)
APOLLOS, TENBRINK ([id number]) Visit Report for 07/04/2016 Arrival Information Details Patient Name: [name], [name] Date of Service: 07/04/2016 2:45 PM Medical Record Number: [id number] Patient Account Number: [id number] Date of Birth/Sex: July 12, 1946 (70 y.o. Male) Treating RN: Phillis Haggis Primary Care Physician: Oretha Milch Other Clinician: Referring Physician: Oretha Milch Treating Physician/Extender: Rudene Re in Treatment: 26 Visit Information History Since Last Visit All ordered tests and consults were completed: No Patient Arrived: Wheel Chair Added or deleted any medications: No Arrival Time: 14:46 Any new allergies or adverse reactions: No Accompanied By: self Had a fall or experienced change in No activities of daily living that may affect Transfer Assistance: None risk of falls: Patient Identification Verified: Yes Signs or symptoms of abuse/neglect since last No Secondary Verification Process Yes visito Completed: Hospitalized since last visit: No Patient Requires Transmission-Based No Pain Present Now: No Precautions: Patient Has Alerts: Yes Patient Alerts: DM II Electronic Signature(s) Signed: 07/04/2016 5:32:44 PM By: Alejandro Mulling Entered By: Alejandro Mulling on 07/04/2016 14:47:46 Rickels, Kerby Seth Bake ([id number]) -------------------------------------------------------------------------------- Encounter Discharge Information Details Patient Name: [name] Date of Service: 07/04/2016 2:45 PM Medical Record Number: [id number] Patient Account Number: [id number] Date of Birth/Sex: 04-24-1946 (70 y.o. Male) Treating RN: Phillis Haggis Primary Care Physician: Oretha Milch Other Clinician: Referring Physician: Oretha Milch Treating Physician/Extender: Rudene Re in Treatment: 77 Encounter Discharge Information Items Discharge Pain Level: 0 Discharge Condition: Stable Ambulatory Status: Wheelchair Discharge Destination: Nursing  Home Transportation: Other Accompanied By: self Schedule Follow-up Appointment: Yes Medication Reconciliation completed and provided to Patient/Care Yes Swannie Milius: Provided on Clinical Summary of Care: 07/04/2016 Form Type Recipient Paper Patient EL Electronic Signature(s) Signed: 07/04/2016 3:32:34 PM By: Gwenlyn Perking Entered By: Gwenlyn Perking on 07/04/2016 15:32:33 Lundstrom, Jacek Seth Bake ([id number]) -------------------------------------------------------------------------------- Lower Extremity Assessment Details Patient Name: [name] Date of Service: 07/04/2016 2:45 PM Medical Record Number: [id number] Patient Account Number: [id number] Date of Birth/Sex: 1946-03-02 (70 y.o. Male) Treating RN: Phillis Haggis Primary Care Physician: Oretha Milch Other Clinician: Referring Physician: Oretha Milch Treating Physician/Extender: Rudene Re in Treatment: 26 Edema Assessment Assessed: [Left: No] [Right: No] E[Left: dema] [Right: :] Calf Left: Right: Point of Measurement: 34 cm From Medial Instep 38.2 cm 35.2 cm Ankle Left: Right: Point of Measurement: 12 cm From Medial Instep 26.3 cm 23.7 cm Vascular Assessment Pulses: Posterior Tibial Dorsalis Pedis Palpable: [Left:Yes] [Right:Yes] Extremity colors, hair growth, and conditions: Extremity Color: [Left:Hyperpigmented] [Right:Hyperpigmented] Hair Growth on Extremity: [Left:No] [Right:No] Temperature of Extremity: [Left:Warm] [Right:Warm] Capillary Refill: [Left:< 3 seconds] [Right:< 3 seconds] Electronic Signature(s) Signed: 07/04/2016 5:32:44 PM By: Alejandro Mulling Entered By: Alejandro Mulling on 07/04/2016 14:55:04 Bedoya, Ronnald V. ([id number]) -------------------------------------------------------------------------------- Multi Wound Chart Details Patient Name: [name] Date of Service: 07/04/2016 2:45 PM Medical Record Number: [id number] Patient Account Number: [id number] Date of Birth/Sex:  03-02-1946 (70 y.o. Male) Treating RN: [name], RN, [name], [place] Primary Care Physician: Oretha Milch Other Clinician: Referring Physician: Oretha Milch Treating Physician/Extender: Rudene Re in Treatment: 26 Vital Signs Height(in): 70 Pulse(bpm): 69 Weight(lbs): 235 Blood Pressure 144/81 (mmHg): Body Mass Index(BMI): 34 Temperature(F): 98.2 Respiratory Rate 18 (breaths/min): Photos: [1:No Photos] [2:No Photos] [3:No Photos] Wound Location: [1:Left Lower Leg] [2:Right Lower Leg] [3:Right Calcaneus] Wounding Event: [1:Gradually Appeared] [2:Gradually Appeared] [3:Pressure Injury] Primary Etiology: [1:Venous Leg Ulcer] [2:Venous Leg Ulcer] [3:Pressure Ulcer] Comorbid History: [1:Cataracts, Chronic Obstructive Pulmonary Disease (COPD), Sleep Disease (COPD), Sleep Disease (COPD), Sleep Apnea, Angina, Congestive Heart Failure, Congestive Heart Failure, Congestive Heart Failure, Hypertension, Type II Diabetes,  Gout, Osteoarthritis, Neuropathy Osteoarthritis, Neuropathy Osteoarthritis, Neuropathy] [2:Cataracts, Chronic Obstructive Pulmonary Apnea, Angina, Hypertension, Type II Diabetes, Gout,] [3:Cataracts, Chronic Obstructive Pulmonary Apnea, Angina,  Hypertension, Type II Diabetes, Gout,] Date Acquired: [1:12/31/2014] [2:12/31/2014] [3:10/31/2015] Weeks of Treatment: [1:26] [2:26] [3:26] Wound Status: [1:Open] [2:Open] [3:Open] Measurements L x W x D 0.1x0.1x0.1 [2:0.1x0.1x0.1] [3:0.5x2x0.2] (cm) Area (cm) : [1:0.008] [2:0.008] [3:0.785] Volume (cm) : [1:0.001] [2:0.001] [3:0.157] % Reduction in Area: [1:100.00%] [2:96.60%] [3:88.90%] % Reduction in Volume: 100.00% [2:95.80%] [3:88.90%] Classification: [1:Partial Thickness] [2:Partial Thickness] [3:Category/Stage III] HBO Classification: [1:Grade 1] [2:Grade 1] [3:Grade 1] Exudate Amount: [1:Large] [2:Large] [3:Large] Exudate Type: [1:Serous] [2:Serosanguineous] [3:Serous] Exudate Color: [1:amber] [2:red, brown]  [3:amber] Wound Margin: [1:Flat and Intact] [2:Flat and Intact] [3:Flat and Intact] Granulation Amount: [1:None Present (0%)] [2:None Present (0%)] [3:Medium (34-66%)] Granulation Quality: [1:N/A] [2:N/A] [3:Red, Pink] Necrotic Amount: [1:None Present (0%)] [2:None Present (0%)] [3:Medium (34-66%)] Exposed Structures: Fascia: No Fascia: No Fascia: No Fat: No Fat: No Fat: No Tendon: No Tendon: No Tendon: No Muscle: No Muscle: No Muscle: No Joint: No Joint: No Joint: No Bone: No Bone: No Bone: No Limited to Skin Limited to Skin Limited to Skin Breakdown Breakdown Breakdown Epithelialization: Large (67-100%) Medium (34-66%) None Periwound Skin Texture: Edema: Yes Edema: Yes Edema: Yes Periwound Skin Moist: Yes Moist: Yes Maceration: Yes Moisture: Maceration: No Moist: Yes Periwound Skin Color: Erythema: Yes Erythema: Yes Erythema: Yes Erythema Location: Circumferential Circumferential Circumferential Temperature: No Abnormality No Abnormality No Abnormality Tenderness on Yes Yes Yes Palpation: Wound Preparation: Ulcer Cleansing: Other: Ulcer Cleansing: Other: Ulcer Cleansing: soap and water soap and water Rinsed/Irrigated with Saline Topical Anesthetic Topical Anesthetic Applied: Other: lidocaine Applied: Other: lidocaine Topical Anesthetic 4% 4% Applied: Other: lidocaine 4% Treatment Notes Electronic Signature(s) Signed: 07/04/2016 5:25:53 PM By: Elpidio EricAfful, Rita [name], RN Entered By: Elpidio EricAfful, Rita on 07/04/2016 15:03:47 [name] OttoLINDLEY, Demarkis V. ([id number]) -------------------------------------------------------------------------------- Multi-Disciplinary Care Plan Details Patient Name: [name] OttoLINDLEY, Levaughn V. Date of Service: 07/04/2016 2:45 PM Medical Record Number: [id number] Patient Account Number: [id number] Date of Birth/Sex: 1946/08/20 53(70 y.o. Male) Treating RN: [name], RN, [name], [name] Primary Care Physician: Oretha MilchSMITH, [name] Other Clinician: Referring Physician: Oretha MilchSMITH,  [name] Treating Physician/Extender: Rudene ReBritto, Errol Weeks in Treatment: 8726 Active Inactive Abuse / Safety / Falls / Self Care Management Nursing Diagnoses: Potential for falls Goals: Patient will remain injury free Date Initiated: 03/01/2016 Goal Status: Active Interventions: Assess fall risk on admission and as needed Notes: Nutrition Nursing Diagnoses: Imbalanced nutrition Goals: Patient/caregiver agrees to and verbalizes understanding of need to use nutritional supplements and/or vitamins as prescribed Date Initiated: 03/01/2016 Goal Status: Active Interventions: Assess patient nutrition upon admission and as needed per policy Notes: Orientation to the Wound Care Program Nursing Diagnoses: Knowledge deficit related to the wound healing center program Goals: Patient/caregiver will verbalize understanding of the [facility] [address]Program [name] OttoLINDLEY, Sergei V. ([id number]) Date Initiated: 03/01/2016 Goal Status: Active Interventions: Provide education on orientation to the wound center Notes: Pain, Acute or Chronic Nursing Diagnoses: Pain, acute or chronic: actual or potential Potential alteration in comfort, pain Goals: Patient will verbalize adequate pain control and receive pain control interventions during procedures as needed Date Initiated: 03/01/2016 Goal Status: Active Patient/caregiver will verbalize adequate pain control between visits Date Initiated: 03/01/2016 Goal Status: Active Interventions: Assess comfort goal upon admission Complete pain assessment as per visit requirements Notes: Pressure Nursing Diagnoses: Knowledge deficit related to causes and risk factors for pressure ulcer development Knowledge deficit related to management of pressures ulcers Goals: Patient/caregiver will verbalize risk factors for pressure ulcer  development Date Initiated: 03/01/2016 Goal Status: Active Interventions: Assess offloading mechanisms upon admission and as  needed Notes: Wound/Skin Impairment Nursing Diagnoses: RAYMOND, BHARDWAJ V. ([id number]) Impaired tissue integrity Goals: Ulcer/skin breakdown will have a volume reduction of 30% by week 4 Date Initiated: 03/01/2016 Goal Status: Active Ulcer/skin breakdown will have a volume reduction of 50% by week 8 Date Initiated: 03/01/2016 Goal Status: Active Ulcer/skin breakdown will have a volume reduction of 80% by week 12 Date Initiated: 03/01/2016 Goal Status: Active Interventions: Assess ulceration(s) every visit Notes: Electronic Signature(s) Signed: 07/04/2016 5:25:53 PM By: Elpidio Eric [name], RN Entered By: Elpidio Eric on 07/04/2016 15:03:25 [name] ([id number]) -------------------------------------------------------------------------------- Pain Assessment Details Patient Name: [name] Date of Service: 07/04/2016 2:45 PM Medical Record Number: [id number] Patient Account Number: [id number] Date of Birth/Sex: [birth year] (70 y.o. Male) Treating RN: Phillis Haggis Primary Care Physician: Oretha Milch Other Clinician: Referring Physician: Oretha Milch Treating Physician/Extender: Rudene Re in Treatment: 26 Active Problems Location of Pain Severity and Description of Pain Patient Has Paino No Site Locations With Dressing Change: No Pain Management and Medication Current Pain Management: Electronic Signature(s) Signed: 07/04/2016 5:32:44 PM By: Alejandro Mulling Entered By: Alejandro Mulling on 07/04/2016 14:48:13 [name], Izacc Seth Bake ([id number]) -------------------------------------------------------------------------------- Patient/Caregiver Education Details Patient Name: [name] Date of Service: 07/04/2016 2:45 PM Medical Record Number: [id number] Patient Account Number: [id number] Date of Birth/Gender: 11/10/1946 (70 y.o. Male) Treating RN: Phillis Haggis Primary Care Physician: Oretha Milch Other Clinician: Referring Physician: Oretha Milch Treating  Physician/Extender: Rudene Re in Treatment: 28 Education Assessment Education Provided To: Patient Education Topics Provided Wound/Skin Impairment: Handouts: Other: change dressing as ordered and keep wraps dry and clean Methods: Demonstration, Explain/Verbal Responses: State content correctly Electronic Signature(s) Signed: 07/04/2016 5:32:44 PM By: Alejandro Mulling Entered By: Alejandro Mulling on 07/04/2016 14:59:17 Bessler, Tahjir V. ([id number]) -------------------------------------------------------------------------------- Wound Assessment Details Patient Name: [name] Date of Service: 07/04/2016 2:45 PM Medical Record Number: [id number] Patient Account Number: [id number] Date of Birth/Sex: 03-13-1946 (70 y.o. Male) Treating RN: Phillis Haggis Primary Care Physician: Oretha Milch Other Clinician: Referring Physician: Oretha Milch Treating Physician/Extender: Rudene Re in Treatment: 26 Wound Status Wound Number: 1 Primary Venous Leg Ulcer Etiology: Wound Location: Left Lower Leg Wound Open Wounding Event: Gradually Appeared Status: Date Acquired: 12/31/2014 Comorbid Cataracts, Chronic Obstructive Weeks Of Treatment: 26 History: Pulmonary Disease (COPD), Sleep Clustered Wound: No Apnea, Angina, Congestive Heart Failure, Hypertension, Type II Diabetes, Gout, Osteoarthritis, Neuropathy Photos Photo Uploaded By: Alejandro Mulling on 07/04/2016 15:59:15 Wound Measurements Length: (cm) 0.1 Width: (cm) 0.1 Depth: (cm) 0.1 Area: (cm) 0.008 Volume: (cm) 0.001 % Reduction in Area: 100% % Reduction in Volume: 100% Epithelialization: Large (67-100%) Tunneling: No Undermining: No Wound Description Classification: Partial Thickness Foul Odor A Diabetic Severity (Wagner): Grade 1 Wound Margin: Flat and Intact Exudate Amount: Large Exudate Type: Serous Exudate Color: amber fter Cleansing: No Wound Bed Granulation Amount: None Present (0%)  Exposed Structure Swartout, Nainoa V. ([id number]) Necrotic Amount: None Present (0%) Fascia Exposed: No Fat Layer Exposed: No Tendon Exposed: No Muscle Exposed: No Joint Exposed: No Bone Exposed: No Limited to Skin Breakdown Periwound Skin Texture Texture Color No Abnormalities Noted: No No Abnormalities Noted: No Localized Edema: Yes Erythema: Yes Erythema Location: Circumferential Moisture No Abnormalities Noted: No Temperature / Pain Moist: Yes Temperature: No Abnormality Tenderness on Palpation: Yes Wound Preparation Ulcer Cleansing: Other: soap and water, Topical Anesthetic Applied: Other: lidocaine 4%, Treatment Notes Wound #1 (Left Lower Leg) 1. Cleansed with: Cleanse  wound with antibacterial soap and water 4. Dressing Applied: Aquacel Ag 5. Secondary Dressing Applied ABD Pad 7. Secured with Tape 3 Layer Compression System - Bilateral Notes unna to anchor Electronic Signature(s) Signed: 07/04/2016 5:32:44 PM By: Alejandro Mulling Entered By: Alejandro Mulling on 07/04/2016 14:56:00 Eckmann, Diandre V. ([id number]) -------------------------------------------------------------------------------- Wound Assessment Details Patient Name: [name] Date of Service: 07/04/2016 2:45 PM Medical Record Number: [id number] Patient Account Number: [id number] Date of Birth/Sex: [birth year] (70 y.o. Male) Treating RN: Phillis Haggis Primary Care Physician: Oretha Milch Other Clinician: Referring Physician: Oretha Milch Treating Physician/Extender: Rudene Re in Treatment: 26 Wound Status Wound Number: 2 Primary Venous Leg Ulcer Etiology: Wound Location: Right Lower Leg Wound Open Wounding Event: Gradually Appeared Status: Date Acquired: 12/31/2014 Comorbid Cataracts, Chronic Obstructive Weeks Of Treatment: 26 History: Pulmonary Disease (COPD), Sleep Clustered Wound: No Apnea, Angina, Congestive Heart Failure, Hypertension, Type II Diabetes, Gout,  Osteoarthritis, Neuropathy Photos Photo Uploaded By: Alejandro Mulling on 07/04/2016 15:59:35 Wound Measurements Length: (cm) 0.1 Width: (cm) 0.1 Depth: (cm) 0.1 Area: (cm) 0.008 Volume: (cm) 0.001 % Reduction in Area: 96.6% % Reduction in Volume: 95.8% Epithelialization: Medium (34-66%) Tunneling: No Undermining: No Wound Description Classification: Partial Thickness Foul Odor Diabetic Severity (Wagner): Grade 1 Wound Margin: Flat and Intact Exudate Amount: Large Exudate Type: Serosanguineous Exudate Color: red, brown After Cleansing: No Wound Bed Granulation Amount: None Present (0%) Exposed Structure Monterrosa, Brenson V. ([id number]) Necrotic Amount: None Present (0%) Fascia Exposed: No Fat Layer Exposed: No Tendon Exposed: No Muscle Exposed: No Joint Exposed: No Bone Exposed: No Limited to Skin Breakdown Periwound Skin Texture Texture Color No Abnormalities Noted: No No Abnormalities Noted: No Localized Edema: Yes Erythema: Yes Erythema Location: Circumferential Moisture No Abnormalities Noted: No Temperature / Pain Maceration: No Temperature: No Abnormality Moist: Yes Tenderness on Palpation: Yes Wound Preparation Ulcer Cleansing: Other: soap and water, Topical Anesthetic Applied: Other: lidocaine 4%, Treatment Notes Wound #2 (Right Lower Leg) 1. Cleansed with: Cleanse wound with antibacterial soap and water 4. Dressing Applied: Aquacel Ag 5. Secondary Dressing Applied ABD Pad 7. Secured with Tape 3 Layer Compression System - Bilateral Notes unna to anchor Electronic Signature(s) Signed: 07/04/2016 5:32:44 PM By: Alejandro Mulling Entered By: Alejandro Mulling on 07/04/2016 14:56:19 Gruen, Laken V. ([id number]) -------------------------------------------------------------------------------- Wound Assessment Details Patient Name: [name] Date of Service: 07/04/2016 2:45 PM Medical Record Number: [id number] Patient Account Number:  [id number] Date of Birth/Sex: [birth year] (70 y.o. Male) Treating RN: Phillis Haggis Primary Care Physician: Oretha Milch Other Clinician: Referring Physician: Oretha Milch Treating Physician/Extender: Rudene Re in Treatment: 26 Wound Status Wound Number: 3 Primary Pressure Ulcer Etiology: Wound Location: Right Calcaneus Wound Open Wounding Event: Pressure Injury Status: Date Acquired: 10/31/2015 Comorbid Cataracts, Chronic Obstructive Weeks Of Treatment: 26 History: Pulmonary Disease (COPD), Sleep Clustered Wound: No Apnea, Angina, Congestive Heart Failure, Hypertension, Type II Diabetes, Gout, Osteoarthritis, Neuropathy Photos Photo Uploaded By: Alejandro Mulling on 07/04/2016 15:59:57 Wound Measurements Length: (cm) 0.5 Width: (cm) 2 Depth: (cm) 0.2 Area: (cm) 0.785 Volume: (cm) 0.157 % Reduction in Area: 88.9% % Reduction in Volume: 88.9% Epithelialization: None Tunneling: No Undermining: No Wound Description Classification: Category/Stage III Foul Odor A Diabetic Severity (Wagner): Grade 1 Wound Margin: Flat and Intact Exudate Amount: Large Exudate Type: Serous Exudate Color: amber fter Cleansing: No Wound Bed Granulation Amount: Medium (34-66%) Exposed Structure Cedeno, Bain V. ([id number]) Granulation Quality: Red, Pink Fascia Exposed: No Necrotic Amount: Medium (34-66%) Fat Layer Exposed: No Necrotic Quality: Adherent Slough Tendon Exposed: No Muscle  Exposed: No Joint Exposed: No Bone Exposed: No Limited to Skin Breakdown Periwound Skin Texture Texture Color No Abnormalities Noted: No No Abnormalities Noted: No Localized Edema: Yes Erythema: Yes Erythema Location: Circumferential Moisture No Abnormalities Noted: No Temperature / Pain Maceration: Yes Temperature: No Abnormality Moist: Yes Tenderness on Palpation: Yes Wound Preparation Ulcer Cleansing: Rinsed/Irrigated with Saline Topical Anesthetic Applied: Other: lidocaine  4%, Treatment Notes Wound #3 (Right Calcaneus) 1. Cleansed with: Cleanse wound with antibacterial soap and water 2. Anesthetic Topical Lidocaine 4% cream to wound bed prior to debridement 4. Dressing Applied: Aquacel Ag 5. Secondary Dressing Applied ABD Pad Dry Gauze Kerlix/Conform 7. Secured with Secretary/administrator) Signed: 07/04/2016 5:32:44 PM By: Alejandro Mulling Entered By: Alejandro Mulling on 07/04/2016 14:56:50 [name], Adelaido V. ([id number]) -------------------------------------------------------------------------------- Vitals Details Patient Name: [name] Date of Service: 07/04/2016 2:45 PM Medical Record Number: [id number] Patient Account Number: [id number] Date of Birth/Sex: 07/03/1946 (70 y.o. Male) Treating RN: Phillis Haggis Primary Care Physician: Oretha Milch Other Clinician: Referring Physician: Oretha Milch Treating Physician/Extender: Rudene Re in Treatment: 26 Vital Signs Time Taken: 14:48 Temperature (F): 98.2 Height (in): 70 Pulse (bpm): 69 Weight (lbs): 235 Respiratory Rate (breaths/min): 18 Body Mass Index (BMI): 33.7 Blood Pressure (mmHg): 144/81 Reference Range: 80 - 120 mg / dl Electronic Signature(s) Signed: 07/04/2016 5:32:44 PM By: Alejandro Mulling Entered By: Alejandro Mulling on 07/04/2016 14:49:04

## 2016-07-11 ENCOUNTER — Encounter: Payer: Medicare Other | Admitting: Surgery

## 2016-07-11 DIAGNOSIS — E11621 Type 2 diabetes mellitus with foot ulcer: Secondary | ICD-10-CM | POA: Diagnosis not present

## 2016-07-12 NOTE — Progress Notes (Signed)
[name], [name] ([id number]) Visit Report for 07/11/2016 Chief Complaint Document Details Patient Name: [name], [name] Date of Service: 07/11/2016 1:30 PM Medical Record Number: [id number] Patient Account Number: [id number] Date of Birth/Sex: January 13, 1946 (70 y.o. Male) Treating RN: Phillis Haggis Primary Care Physician: Oretha Milch Other Clinician: Referring Physician: Oretha Milch Treating Physician/Extender: Rudene Re in Treatment: 71 Information Obtained from: Patient Chief Complaint Patient is at the clinic for treatment of an open pressure ulcer the left upper thigh and gluteal region and the right heel with bilateral swelling of the legs all of it which is going on for over a year Electronic Signature(s) Signed: 07/11/2016 2:17:19 PM By: Evlyn Kanner MD, FACS Entered By: Evlyn Kanner on 07/11/2016 14:17:18 [name] ([id number]) -------------------------------------------------------------------------------- HPI Details Patient Name: [name] Date of Service: 07/11/2016 1:30 PM Medical Record Number: [id number] Patient Account Number: [id number] Date of Birth/Sex: [birth year] (70 y.o. Male) Treating RN: Phillis Haggis Primary Care Physician: Oretha Milch Other Clinician: Referring Physician: Oretha Milch Treating Physician/Extender: Rudene Re in Treatment: 71 History of Present Illness Location: ulcerated area on the right heel, left gluteal region and thigh and then bilateral lower extremities Quality: Patient reports experiencing a sharp pain to affected area(s). Severity: Patient states wound are getting worse. Duration: Patient has had the wound for > 12 months prior to seeking treatment at the wound center Timing: Pain in wound is constant (hurts all the time) Context: The wound appeared gradually over time Modifying Factors: Other treatment(s) tried include: he sees his heart doctor and his primary care doctor Associated Signs and  Symptoms: patient has not been able to walk for over a year now HPI Description: 70 year old gentleman with a known history of hypertension, diabetes, obstructive sleep apnea, COPD, diastolic CHF, coronary artery disease was admitted to the hospital with sepsis from an ulcer of the right heel and was treated there in October 2016. he also has chronic bilateral lower extremity edema and lymphedema. He had received vancomycin, Zosyn and at that stage and x-rays showed hardware in the right ankle but no evidence of osteomyelitis. he was a former smoker. he was also treated with Augmentin orally for 10 days. He is either bedbound or wheelchair-bound and does not ambulate by himself. 01/19/2016 -- he has not been seen here for 3 weeks and this was because he was admitted to [facility] between 223 and 01/08/2016 for sepsis, UTI and pneumonia involving the left lung. he was treated with IV vancomycin and Zosyn and then meropenem. Was discharged home on oral Bactrim for 2 weeks none of his vascular test or x-rays were done and we will reorder these. 01/26/2016 -- he has not yet done the x-ray of his foot and is vascular tests are still pending. I have asked him to work on these with his nursing home staff. Addendum: he has got an x-ray of the right foot done which shows that his osteopenia but no specific ostial lysis or abnormal periosteal reaction. Final impression was degenerative changes with dorsal foot soft tissue swelling. 02/16/2016 -- lower extremity arterial duplex examination shows a 50-99% stenosis of the right tibioperoneal trunk. He had biphasic flow in the right SFA, popliteal and tibioperoneal trunk. Left-sided he had triphasic flow throughout 02/29/2016 -- he is awaiting his vascular opinion with Dr. Gilda Crease which is to be done on April 24 03/08/2016 -- he has not kept his appointment with Dr. Gilda Crease on April 24 and does not seem to know what happened  about  this. it's difficult to gauge whether he is in full control of his mental faculties as at times he is extremely rude to the nursing staff. 03/22/2016 -- the patient was seen by Dr. 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 Recommended that the patient had severe atherosclerotic changes of both lower extremities associated with ulceration and tissue loss of the foot. This is a limb threatening ischemia and the patient was recommended to undergo Mcgovern, Roshad V. ([id number]) angiography of the lower extremity with a hope for intervention for limb salvage. Patient agreed and will proceed to angiography. He was admitted to the hospital between May 2 and 03/15/2016 - he underwent induction of a catheter into his right lower extremity and third order catheter placement with contrast injection to the right lower extremity for distal runoff. Percutaneous transluminal angioplasty of the right superficial femoral and popliteal arteries were done. He also had a right peroneal angioplasty. Patient had a postoperative hematoma and was admitted for observation and in the next 24 hours he was very agitated and combative and had to be seen by psychiatric. He had a follow-up with Dr. Gilda Crease in 3 weeks. 04/18/2016 -- notes reviewed from the vascular office where he was seen for his postop visit after the PTA of the right SFA and popliteal arteries on 03/12/2016. He underwent an ABI which showed right ABI to be more than 1.3 and left to be more than 1 more than 1.3, great toe and PPG waveforms are decreased bilaterally. No additional intervention indicated at this time and the patient was to follow-up in 3 months with an ABI and bilateral lower extremity duplex study. 05/02/2016 -- he complains that his compression stockings are causing him a lot of discomfort and he is not happy wearing  them. 05/30/2016 -- the swelling of both legs has increased again and he has had blisters which have opened out into ulcerations. Electronic Signature(s) Signed: 07/11/2016 2:17:29 PM By: Evlyn Kanner MD, FACS Entered By: Evlyn Kanner on 07/11/2016 14:17:29 [name] ([id number]) -------------------------------------------------------------------------------- Physical Exam Details Patient Name: [name] Date of Service: 07/11/2016 1:30 PM Medical Record Number: [id number] Patient Account Number: [id number] Date of Birth/Sex: 06/04/1946 (70 y.o. Male) Treating RN: Phillis Haggis Primary Care Physician: Oretha Milch Other Clinician: Referring Physician: Oretha Milch Treating Physician/Extender: Rudene Re in Treatment: 27 Constitutional . Pulse regular. Respirations normal and unlabored. Afebrile. . Eyes Nonicteric. Reactive to light. Ears, Nose, Mouth, and Throat Lips, teeth, and gums WNL.Marland Kitchen Moist mucosa without lesions. Neck supple and nontender. No palpable supraclavicular or cervical adenopathy. Normal sized without goiter. Respiratory WNL. No retractions.. Breath sounds WNL, No rubs, rales, rhonchi, or wheeze.. Cardiovascular Heart rhythm and rate regular, no murmur or gallop.. Pedal Pulses WNL. No clubbing, cyanosis or edema. Lymphatic No adneopathy. No adenopathy. No adenopathy. Musculoskeletal Adexa without tenderness or enlargement.. Digits and nails w/o clubbing, cyanosis, infection, petechiae, ischemia, or inflammatory conditions.. Integumentary (Hair, Skin) No suspicious lesions. No crepitus or fluctuance. No peri-wound warmth or erythema. No masses.Marland Kitchen Psychiatric Judgement and insight Intact.. No evidence of depression, anxiety, or agitation.. Notes the edema has increased and so has the weeping from his superficial ulcerations. The wound on the right heel is looking very clean today and is healthy granulation tissue and no sharp debridement  was required. Electronic Signature(s) Signed: 07/11/2016 2:18:04 PM By: Evlyn Kanner MD, FACS Entered By: Evlyn Kanner  on 07/11/2016 14:18:03 ETHON, WYMER ([id number]) -------------------------------------------------------------------------------- Physician Orders Details Patient Name: [name] Date of Service: 07/11/2016 1:30 PM Medical Record Number: [id number] Patient Account Number: [id number] Date of Birth/Sex: 1946-05-22 (70 y.o. Male) Treating RN: [name], RN, [name], [place] Primary Care Physician: Oretha Milch Other Clinician: Referring Physician: Oretha Milch Treating Physician/Extender: Rudene Re in Treatment: 52 Verbal / Phone Orders: Yes Clinician: [name], RN, [name], Rita Read Back and Verified: Yes Diagnosis Coding Wound Cleansing Wound #1 Left Lower Leg o No tub bath. o Other: - Please do sink or bed baths **do not get leg wraps wet** Wound #2 Right Lower Leg o No tub bath. o Other: - Please do sink or bed baths **do not get leg wraps wet** Wound #3 Right Calcaneus o No tub bath. o Other: - Please do sink or bed baths **do not get leg wraps wet** Anesthetic Wound #1 Left Lower Leg o Topical Lidocaine 4% cream applied to wound bed prior to debridement - for clinic use only o Topical Lidocaine 4% cream applied to wound bed prior to debridement - for clinic use only Wound #2 Right Lower Leg o Topical Lidocaine 4% cream applied to wound bed prior to debridement - for clinic use only o Topical Lidocaine 4% cream applied to wound bed prior to debridement - for clinic use only Wound #3 Right Calcaneus o Topical Lidocaine 4% cream applied to wound bed prior to debridement - for clinic use only o Topical Lidocaine 4% cream applied to wound bed prior to debridement - for clinic use only Skin Barriers/Peri-Wound Care Wound #1 Left Lower Leg o Barrier cream - around the heel wound to reddened and irritated areas Wound #2 Right Lower  Leg o Barrier cream - around the heel wound to reddened and irritated areas Wound #3 Right Calcaneus [name], [name] ([id number]) o Barrier cream - around the heel wound to reddened and irritated areas Primary Wound Dressing Wound #1 Left Lower Leg o Aquacel Ag Wound #2 Right Lower Leg o Aquacel Ag Wound #3 Right Calcaneus o Prisma Ag - please moisten with normal saline then place on wound Secondary Dressing Wound #1 Left Lower Leg o ABD pad Wound #2 Right Lower Leg o ABD pad Wound #3 Right Calcaneus o ABD pad o Dry Gauze o Conform/Kerlix Dressing Change Frequency Wound #1 Left Lower Leg o Change dressing every week - at the wound clinic Wound #2 Right Lower Leg o Change dressing every week - at the wound clinic Wound #3 Right Calcaneus o Change dressing every other day. - to be changed every other day by SN[facility] RN Follow-up Appointments Wound #1 Left Lower Leg o Return Appointment in 1 week. Wound #2 Right Lower Leg o Return Appointment in 1 week. Wound #3 Right Calcaneus o Return Appointment in 1 week. Edema Control Wound #1 Left Lower Leg o 3 Layer Compression System - Bilateral [name], [name] ([id number]) o Elevate legs to the level of the heart and pump ankles as often as possible Wound #2 Right Lower Leg o 3 Layer Compression System - Bilateral o Elevate legs to the level of the heart and pump ankles as often as possible Off-Loading Wound #1 Left Lower Leg o Turn and reposition every 2 hours o Other: - sage boots at night and float heel when lying in bed Wound #2 Right Lower Leg o Turn and reposition every 2 hours o Other: - sage boots at night and float heel when lying in bed Wound #3  Right Calcaneus o Turn and reposition every 2 hours o Other: - sage boots at night and float heel when lying in bed Additional Orders / Instructions Wound #1 Left Lower Leg o Increase protein intake. o Other: - LOW  SALT DIET Wound #2 Right Lower Leg o Increase protein intake. Wound #3 Right Calcaneus o Increase protein intake. Medications-please add to medication list. Wound #1 Left Lower Leg o Other: - Vitamin C, Vitamin A, Zinc, multivitamin Wound #2 Right Lower Leg o Other: - Vitamin C, Vitamin A, Zinc, multivitamin Wound #3 Right Calcaneus o Other: - Vitamin C, Vitamin A, Zinc, multivitamin Electronic Signature(s) Signed: 07/11/2016 4:38:34 PM By: Evlyn Kanner MD, FACS Signed: 07/11/2016 4:42:28 PM By: Evette Georges, Trapper V. ([id number]) Entered By: Alejandro Mulling on 07/11/2016 14:18:56 [name] ([id number]) -------------------------------------------------------------------------------- Problem List Details Patient Name: [name] Date of Service: 07/11/2016 1:30 PM Medical Record Number: [id number] Patient Account Number: [id number] Date of Birth/Sex: 01-03-1946 (70 y.o. Male) Treating RN: Phillis Haggis Primary Care Physician: Oretha Milch Other Clinician: Referring Physician: Oretha Milch Treating Physician/Extender: Rudene Re in Treatment: 52 Active Problems ICD-10 Encounter Code Description Active Date Diagnosis E11.621 Type 2 diabetes mellitus with foot ulcer 01/01/2016 Yes L89.613 Pressure ulcer of right heel, stage 3 01/01/2016 Yes E66.01 Morbid (severe) obesity due to excess calories 01/01/2016 Yes I89.0 Lymphedema, not elsewhere classified 01/01/2016 Yes M70.871 Other soft tissue disorders related to use, overuse and 01/19/2016 Yes pressure, right ankle and foot 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/16/2016 Yes ulceration of heel and midfoot Inactive Problems Resolved Problems ICD-10 Code Description Active Date Resolved Date L89.322 Pressure ulcer of left buttock, stage 2 01/01/2016 01/01/2016 Electronic Signature(s) Signed: 07/11/2016 2:17:10 PM By: Evlyn Kanner MD, FACS Entered By: Evlyn Kanner on  07/11/2016 14:17:09 [name] ([id number]) [name], [name] Kitchen ([id number]) -------------------------------------------------------------------------------- Progress Note Details Patient Name: [name] Date of Service: 07/11/2016 1:30 PM Medical Record Number: [id number] Patient Account Number: [id number] Date of Birth/Sex: 1946-03-12 (70 y.o. Male) Treating RN: Phillis Haggis Primary Care Physician: Oretha Milch Other Clinician: Referring Physician: Oretha Milch Treating Physician/Extender: Rudene Re in Treatment: 40 Subjective Chief Complaint Information obtained from Patient Patient is at the clinic for treatment of an open pressure ulcer the left upper thigh and gluteal region and the right heel with bilateral swelling of the legs all of it which is going on for over a year History of Present Illness (HPI) The following HPI elements were documented for the patient's wound: Location: ulcerated area on the right heel, left gluteal region and thigh and then bilateral lower extremities Quality: Patient reports experiencing a sharp pain to affected area(s). Severity: Patient states wound are getting worse. Duration: Patient has had the wound for > 12 months prior to seeking treatment at the wound center Timing: Pain in wound is constant (hurts all the time) Context: The wound appeared gradually over time Modifying Factors: Other treatment(s) tried include: he sees his heart doctor and his primary care doctor Associated Signs and Symptoms: patient has not been able to walk for over a year now 70 year old gentleman with a known history of hypertension, diabetes, obstructive sleep apnea, COPD, diastolic CHF, coronary artery disease was admitted to the hospital with sepsis from an ulcer of the right heel and was treated there in October 2016. he also has chronic bilateral lower extremity edema and lymphedema. He had received vancomycin, Zosyn and at that stage and  x-rays showed hardware in the  right ankle but no evidence of osteomyelitis. he was a former smoker. he was also treated with Augmentin orally for 10 days. He is either bedbound or wheelchair-bound and does not ambulate by himself. 01/19/2016 -- he has not been seen here for 3 weeks and this was because he was admitted to [facility] between 223 and 01/08/2016 for sepsis, UTI and pneumonia involving the left lung. he was treated with IV vancomycin and Zosyn and then meropenem. Was discharged home on oral Bactrim for 2 weeks none of his vascular test or x-rays were done and we will reorder these. 01/26/2016 -- he has not yet done the x-ray of his foot and is vascular tests are still pending. I have asked him to work on these with his nursing home staff. Addendum: he has got an x-ray of the right foot done which shows that his osteopenia but no specific ostial lysis or abnormal periosteal reaction. Final impression was degenerative changes with dorsal foot soft tissue swelling. 02/16/2016 -- lower extremity arterial duplex examination shows a 50-99% stenosis of the right tibioperoneal trunk. He had biphasic flow in the right SFA, popliteal and tibioperoneal trunk. Left-sided he had triphasic flow throughout ESWIN, WORRELL ([id number]) 02/29/2016 -- he is awaiting his vascular opinion with Dr. Gilda Crease which is to be done on April 24 03/08/2016 -- he has not kept his appointment with Dr. Gilda Crease on April 24 and does not seem to know what happened about this. it's difficult to gauge whether he is in full control of his mental faculties as at times he is extremely rude to the nursing staff. 03/22/2016 -- the patient was seen by Dr. 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 Recommended that the patient had severe atherosclerotic changes of both lower extremities  associated with ulceration and tissue loss of the foot. This is a limb threatening ischemia and the patient was recommended to undergo angiography of the lower extremity with a hope for intervention for limb salvage. Patient agreed and will proceed to angiography. He was admitted to the hospital between May 2 and 03/15/2016 - he underwent induction of a catheter into his right lower extremity and third order catheter placement with contrast injection to the right lower extremity for distal runoff. Percutaneous transluminal angioplasty of the right superficial femoral and popliteal arteries were done. He also had a right peroneal angioplasty. Patient had a postoperative hematoma and was admitted for observation and in the next 24 hours he was very agitated and combative and had to be seen by psychiatric. He had a follow-up with Dr. Gilda Crease in 3 weeks. 04/18/2016 -- notes reviewed from the vascular office where he was seen for his postop visit after the PTA of the right SFA and popliteal arteries on 03/12/2016. He underwent an ABI which showed right ABI to be more than 1.3 and left to be more than 1 more than 1.3, great toe and PPG waveforms are decreased bilaterally. No additional intervention indicated at this time and the patient was to follow-up in 3 months with an ABI and bilateral lower extremity duplex study. 05/02/2016 -- he complains that his compression stockings are causing him a lot of discomfort and he is not happy wearing them. 05/30/2016 -- the swelling of both legs has increased again and he has had blisters which have opened out into ulcerations. Objective Constitutional Pulse regular. Respirations normal and unlabored. Afebrile.  Vitals Time Taken: 1:44 PM, Height: 70 in, Weight: 235 lbs, BMI: 33.7, Temperature: 97.7 F, Pulse: 78 bpm, Respiratory Rate: 18 breaths/min, Blood Pressure: 140/76 mmHg. Eyes Nonicteric. Reactive to light. Ears, Nose, Mouth, and Throat Lips,  teeth, and gums WNL.Marland Kitchen Moist mucosa without lesions. Neck Milich, Elihue V. ([id number]) supple and nontender. No palpable supraclavicular or cervical adenopathy. Normal sized without goiter. Respiratory WNL. No retractions.. Breath sounds WNL, No rubs, rales, rhonchi, or wheeze.. Cardiovascular Heart rhythm and rate regular, no murmur or gallop.. Pedal Pulses WNL. No clubbing, cyanosis or edema. Lymphatic No adneopathy. No adenopathy. No adenopathy. Musculoskeletal Adexa without tenderness or enlargement.. Digits and nails w/o clubbing, cyanosis, infection, petechiae, ischemia, or inflammatory conditions.Marland Kitchen Psychiatric Judgement and insight Intact.. No evidence of depression, anxiety, or agitation.. General Notes: the edema has increased and so has the weeping from his superficial ulcerations. The wound on the right heel is looking very clean today and is healthy granulation tissue and no sharp debridement was required. Integumentary (Hair, Skin) No suspicious lesions. No crepitus or fluctuance. No peri-wound warmth or erythema. No masses.. Wound #1 status is Open. Original cause of wound was Gradually Appeared. The wound is located on the Left Lower Leg. The wound measures 12cm length x 10cm width x 0.1cm depth; 94.248cm^2 area and 9.425cm^3 volume. The wound is limited to skin breakdown. There is no tunneling noted. There is a large amount of serous drainage noted. The wound margin is flat and intact. There is large (67-100%) red, pink granulation within the wound bed. There is a small (1-33%) amount of necrotic tissue within the wound bed including Adherent Slough. The periwound skin appearance exhibited: Localized Edema, Moist, Erythema. The surrounding wound skin color is noted with erythema which is circumferential. Periwound temperature was noted as No Abnormality. The periwound has tenderness on palpation. Wound #2 status is Open. Original cause of wound was Gradually Appeared. The  wound is located on the Right Lower Leg. The wound measures 14cm length x 14cm width x 0.1cm depth; 153.938cm^2 area and 15.394cm^3 volume. The wound is limited to skin breakdown. There is no tunneling or undermining noted. There is a large amount of serosanguineous drainage noted. The wound margin is flat and intact. There is large (67-100%) red, pink granulation within the wound bed. There is a small (1-33%) amount of necrotic tissue within the wound bed including Adherent Slough. The periwound skin appearance exhibited: Localized Edema, Moist, Erythema. The periwound skin appearance did not exhibit: Maceration. The surrounding wound skin color is noted with erythema which is circumferential. Periwound temperature was noted as No Abnormality. The periwound has tenderness on palpation. Wound #3 status is Open. Original cause of wound was Pressure Injury. The wound is located on the Right Calcaneus. The wound measures 0.4cm length x 1.8cm width x 0.2cm depth; 0.565cm^2 area and 0.113cm^3 volume. The wound is limited to skin breakdown. There is no tunneling or undermining noted. There is a large amount of serous drainage noted. The wound margin is flat and intact. There is medium (34-66%) red, pink granulation within the wound bed. There is a medium (34-66%) amount of necrotic tissue within the wound bed including Adherent Slough. The periwound skin appearance exhibited: [name], [name] ([id number]) Localized Edema, Maceration, Moist, Erythema. The surrounding wound skin color is noted with erythema which is circumferential. Periwound temperature was noted as No Abnormality. The periwound has tenderness on palpation. Assessment Active Problems ICD-10 E11.621 - Type 2 diabetes mellitus with foot ulcer L89.613 - Pressure ulcer of  right heel, stage 3 E66.01 - Morbid (severe) obesity due to excess calories I89.0 - Lymphedema, not elsewhere classified M70.871 - Other soft tissue disorders  related to use, overuse and pressure, right ankle and foot I70.234 - Atherosclerosis of native arteries of right leg with ulceration of heel and midfoot Plan Wound Cleansing: Wound #1 Left Lower Leg: No tub bath. Other: - Please do sink or bed baths **do not get leg wraps wet** Wound #2 Right Lower Leg: No tub bath. Other: - Please do sink or bed baths **do not get leg wraps wet** Wound #3 Right Calcaneus: No tub bath. Other: - Please do sink or bed baths **do not get leg wraps wet** Anesthetic: Wound #1 Left Lower Leg: Topical Lidocaine 4% cream applied to wound bed prior to debridement - for clinic use only Topical Lidocaine 4% cream applied to wound bed prior to debridement - for clinic use only Wound #2 Right Lower Leg: Topical Lidocaine 4% cream applied to wound bed prior to debridement - for clinic use only Topical Lidocaine 4% cream applied to wound bed prior to debridement - for clinic use only Wound #3 Right Calcaneus: Topical Lidocaine 4% cream applied to wound bed prior to debridement - for clinic use only Topical Lidocaine 4% cream applied to wound bed prior to debridement - for clinic use only Skin Barriers/Peri-Wound Care: Wound #1 Left Lower Leg: Barrier cream - around the heel wound to reddened and irritated areas Wound #2 Right Lower Leg: Cardarelli, Isador V. ([id number]) Barrier cream - around the heel wound to reddened and irritated areas Wound #3 Right Calcaneus: Barrier cream - around the heel wound to reddened and irritated areas Primary Wound Dressing: Wound #1 Left Lower Leg: Aquacel Ag Wound #2 Right Lower Leg: Aquacel Ag Wound #3 Right Calcaneus: Prisma Ag Secondary Dressing: Wound #1 Left Lower Leg: ABD pad Wound #2 Right Lower Leg: ABD pad Wound #3 Right Calcaneus: ABD pad Dry Gauze Conform/Kerlix Dressing Change Frequency: Wound #1 Left Lower Leg: Change dressing every week - at the wound clinic Wound #2 Right Lower Leg: Change dressing  every week - at the wound clinic Wound #3 Right Calcaneus: Change dressing every other day. - to be changed every other day by [facility] RN Follow-up Appointments: Wound #1 Left Lower Leg: Return Appointment in 1 week. Wound #2 Right Lower Leg: Return Appointment in 1 week. Wound #3 Right Calcaneus: Return Appointment in 1 week. Edema Control: Wound #1 Left Lower Leg: 3 Layer Compression System - Bilateral Elevate legs to the level of the heart and pump ankles as often as possible Wound #2 Right Lower Leg: 3 Layer Compression System - Bilateral Elevate legs to the level of the heart and pump ankles as often as possible Off-Loading: Wound #1 Left Lower Leg: Turn and reposition every 2 hours Other: - sage boots at night and float heel when lying in bed Wound #2 Right Lower Leg: Turn and reposition every 2 hours Other: - sage boots at night and float heel when lying in bed Wound #3 Right Calcaneus: Turn and reposition every 2 hours Other: - sage boots at night and float heel when lying in bed Additional Orders / Instructions: [name], [name] ([id number]) Wound #1 Left Lower Leg: Increase protein intake. Other: - LOW SALT DIET Wound #2 Right Lower Leg: Increase protein intake. Wound #3 Right Calcaneus: Increase protein intake. Medications-please add to medication list.: Wound #1 Left Lower Leg: Other: - Vitamin C, Vitamin A, Zinc, multivitamin Wound #2 Right  Lower Leg: Other: - Vitamin C, Vitamin A, Zinc, multivitamin Wound #3 Right Calcaneus: Other: - Vitamin C, Vitamin A, Zinc, multivitamin I have recommended: 1. Prisma AG to his right heel. 2. Constant off loading and also using Sage boot. 3. we will use Silver alginate and 3 layer Profore compressions for both lower extremities. 4. him to be very careful about his salt intake and have recommended a low-salt diet 5. High-protein diet and multivitamins including vitamin C and zinc. Electronic Signature(s) Signed: 07/11/2016  2:20:28 PM By: Evlyn Kanner MD, FACS Entered By: Evlyn Kanner on 07/11/2016 14:20:28 [name] ([id number]) -------------------------------------------------------------------------------- SuperBill Details Patient Name: [name] Date of Service: 07/11/2016 Medical Record Number: [id number] Patient Account Number: [id number] Date of Birth/Sex: 02-09-1946 (70 y.o. 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 Facility Procedures CPT4 Code: [id number] Description: 99214 - WOUND CARE VISIT-LEV 4 EST PT Modifier: Quantity: 1 Physician Procedures CPT4: Description Modifier Quantity Code [id number] 99213 - WC PHYS LEVEL 3 - EST PT 1 ICD-10 Description Diagnosis E11.621 Type 2 diabetes mellitus with foot ulcer L89.613 Pressure ulcer of right heel, stage 3 I89.0 Lymphedema, not elsewhere classified  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

## 2016-07-12 NOTE — Progress Notes (Signed)
[name], [name] ([id number]) Visit Report for 07/11/2016 Arrival Information Details Patient Name: [name], [name] Date of Service: 07/11/2016 1:30 PM Medical Record Number: [id number] Patient Account Number: [id number] Date of Birth/Sex: [birth year] 39(70 y.o. Male) Treating RN: [name], [name] Primary Care Physician: [name] Other Clinician: Referring Physician: Oretha MilchSMITH, [name] Treating Physician/Extender: [name] Weeks in Treatment: 6427 Visit Information History Since Last Visit All ordered tests and consults were completed: No Patient Arrived: Wheel Chair Added or deleted any medications: No Arrival Time: 13:39 Any new allergies or adverse reactions: No Accompanied By: self Had a fall or experienced change in No Transfer Assistance: EasyPivot activities of daily living that may affect Patient Lift risk of falls: Patient Identification Verified: Yes Signs or symptoms of abuse/neglect since last No Secondary Verification Process Yes visito Completed: Hospitalized since last visit: No Patient Requires Transmission- No Pain Present Now: No Based Precautions: Patient Has Alerts: Yes Patient Alerts: DM II Electronic Signature(s) Signed: 07/11/2016 4:42:28 PM By: [name], [name] Entered By: [name], [name] on 07/11/2016 13:43:58 [name], [name] ([id number]) -------------------------------------------------------------------------------- Clinic Level of Care Assessment Details Patient Name: [name], [name] Date of Service: 07/11/2016 1:30 PM Medical Record Number: [id number] Patient Account Number: [id number] Date of Birth/Sex: [birth year] 90(70 y.o. Male) Treating RN: [name] MealyAfful, RN, [name], [name] Primary Care Physician: [name] Other Clinician: Referring Physician: Oretha MilchSMITH, [name] Treating Physician/Extender: [name] Weeks in Treatment: 27 Clinic Level of Care Assessment Items TOOL 4 Quantity Score []  - Use when only an EandM is performed on FOLLOW-UP visit  0 ASSESSMENTS - Nursing Assessment / Reassessment X - Reassessment of Co-morbidities (includes updates in patient status) 1 10 X - Reassessment of Adherence to Treatment Plan 1 5 ASSESSMENTS - Wound and Skin Assessment / Reassessment []  - Simple Wound Assessment / Reassessment - one wound 0 X - Complex Wound Assessment / Reassessment - multiple wounds 3 5 []  - Dermatologic / Skin Assessment (not related to wound area) 0 ASSESSMENTS - Focused Assessment X - Circumferential Edema Measurements - multi extremities 2 5 []  - Nutritional Assessment / Counseling / Intervention 0 []  - Lower Extremity Assessment (monofilament, tuning fork, pulses) 0 []  - Peripheral Arterial Disease Assessment (using hand held doppler) 0 ASSESSMENTS - Ostomy and/or Continence Assessment and Care []  - Incontinence Assessment and Management 0 []  - Ostomy Care Assessment and Management (repouching, etc.) 0 PROCESS - Coordination of Care X - Simple Patient / Family Education for ongoing care 1 15 []  - Complex (extensive) Patient / Family Education for ongoing care 0 X - Staff obtains ChiropractorConsents, Records, Test Results / Process Orders 1 10 []  - Staff telephones HHA, Nursing Homes / Clarify orders / etc 0 []  - Routine Transfer to another Facility (non-emergent condition) 0 [name], [name] ([id number]) []  - Routine Hospital Admission (non-emergent condition) 0 []  - New Admissions / Manufacturing engineernsurance Authorizations / Ordering NPWT, Apligraf, etc. 0 []  - Emergency Hospital Admission (emergent condition) 0 []  - Simple Discharge Coordination 0 []  - Complex (extensive) Discharge Coordination 0 PROCESS - Special Needs []  - Pediatric / Minor Patient Management 0 []  - Isolation Patient Management 0 []  - Hearing / Language / Visual special needs 0 []  - Assessment of Community assistance (transportation, D/C planning, etc.) 0 []  - Additional assistance / Altered mentation 0 []  - Support Surface(s) Assessment (bed, cushion, seat, etc.)  0 INTERVENTIONS - Wound Cleansing / Measurement []  - Simple Wound Cleansing - one wound 0 X - Complex Wound Cleansing - multiple wounds 3 5 X - Wound  Imaging (photographs - any number of wounds) 1 5 []  - Wound Tracing (instead of photographs) 0 []  - Simple Wound Measurement - one wound 0 X - Complex Wound Measurement - multiple wounds 3 5 INTERVENTIONS - Wound Dressings X - Small Wound Dressing one or multiple wounds 3 10 []  - Medium Wound Dressing one or multiple wounds 0 []  - Large Wound Dressing one or multiple wounds 0 []  - Application of Medications - topical 0 []  - Application of Medications - injection 0 INTERVENTIONS - Miscellaneous []  - External ear exam 0 [name], [name] ([id number]) []  - Specimen Collection (cultures, biopsies, blood, body fluids, etc.) 0 []  - Specimen(s) / Culture(s) sent or taken to Lab for analysis 0 []  - Patient Transfer (multiple staff / Michiel Sites Lift / Similar devices) 0 []  - Simple Staple / Suture removal (25 or less) 0 []  - Complex Staple / Suture removal (26 or more) 0 []  - Hypo / Hyperglycemic Management (close monitor of Blood Glucose) 0 []  - Ankle / Brachial Index (ABI) - do not check if billed separately 0 X - Vital Signs 1 5 Has the patient been seen at the hospital within the last three years: Yes Total Score: 135 Level Of Care: New/Established - Level 4 Electronic Signature(s) Signed: 07/11/2016 5:32:33 PM By: [name] [name], RN Entered By: [name] on 07/11/2016 16:58:55 [name] ([id number]) -------------------------------------------------------------------------------- Encounter Discharge Information Details Patient Name: [name] Date of Service: 07/11/2016 1:30 PM Medical Record Number: [id number] Patient Account Number: [id number] Date of Birth/Sex: [birth year] (70 y.o. Male) Treating RN: [name] Haggis Primary Care Physician: [name] Milch Other Clinician: Referring Physician: Oretha Milch Treating  Physician/Extender: [name] Re in Treatment: 28 Encounter Discharge Information Items Discharge Pain Level: 0 Discharge Condition: Stable Ambulatory Status: Wheelchair Discharge Destination: Nursing Home Transportation: Other Accompanied By: self Schedule Follow-up Appointment: Yes Medication Reconciliation completed and provided to Patient/Care Yes Tasnim Balentine: Provided on Clinical Summary of Care: 07/11/2016 Form Type Recipient Paper Patient EL Electronic Signature(s) Signed: 07/11/2016 4:42:28 PM By: [name] Mulling Previous Signature: 07/11/2016 2:41:06 PM Version By: Gwenlyn Perking Entered By: [name] Mulling on 07/11/2016 14:46:26 Eddinger, [name] ([id number]) -------------------------------------------------------------------------------- Lower Extremity Assessment Details Patient Name: [name] Date of Service: 07/11/2016 1:30 PM Medical Record Number: [id number] Patient Account Number: [id number] Date of Birth/Sex: 07/17/1946 (70 y.o. Male) Treating RN: [name] Haggis Primary Care Physician: [name] Milch Other Clinician: Referring Physician: Oretha Milch Treating Physician/Extender: [name] Re in Treatment: 27 Edema Assessment Assessed: [Left: No] [Right: No] E[Left: dema] [Right: :] Calf Left: Right: Point of Measurement: 34 cm From Medial Instep 38 cm 35 cm Ankle Left: Right: Point of Measurement: 12 cm From Medial Instep 26.2 cm 23.6 cm Vascular Assessment Pulses: Posterior Tibial Dorsalis Pedis Palpable: [Left:Yes] [Right:Yes] Extremity colors, hair growth, and conditions: Extremity Color: [Left:Hyperpigmented] [Right:Hyperpigmented] Temperature of Extremity: [Left:Warm] [Right:Warm] Capillary Refill: [Left:< 3 seconds] [Right:< 3 seconds] Electronic Signature(s) Signed: 07/11/2016 4:42:28 PM By: [name] Mulling Entered By: [name] Mulling on 07/11/2016 13:47:10 Pelphrey, [name]  ([id number]) -------------------------------------------------------------------------------- Multi Wound Chart Details Patient Name: [name] Date of Service: 07/11/2016 1:30 PM Medical Record Number: [id number] Patient Account Number: [id number] Date of Birth/Sex: 10/10/1946 (70 y.o. Male) Treating RN: [name] Mealy, RN, [name],  Sink Primary Care Physician: [name] Milch Other Clinician: Referring Physician: Oretha Milch Treating Physician/Extender: [name] Re in Treatment: 27 Vital Signs Height(in): 70 Pulse(bpm): 78 Weight(lbs): 235 Blood Pressure 140/76 (mmHg): Body Mass Index(BMI): 34 Temperature(F): 97.7 Respiratory Rate 18 (breaths/min): Photos: [1:No  Photos] [2:No Photos] [3:No Photos] Wound Location: [1:Left Lower Leg] [2:Right Lower Leg] [3:Right Calcaneus] Wounding Event: [1:Gradually Appeared] [2:Gradually Appeared] [3:Pressure Injury] Primary Etiology: [1:Venous Leg Ulcer] [2:Venous Leg Ulcer] [3:Pressure Ulcer] Comorbid History: [1:Cataracts, Chronic Obstructive Pulmonary Disease (COPD), Sleep Disease (COPD), Sleep Disease (COPD), Sleep Apnea, Angina, Congestive Heart Failure, Congestive Heart Failure, Congestive Heart Failure, Hypertension, Type II Diabetes,  Gout, Osteoarthritis, Neuropathy Osteoarthritis, Neuropathy Osteoarthritis, Neuropathy] [2:Cataracts, Chronic Obstructive Pulmonary Apnea, Angina, Hypertension, Type II Diabetes, Gout,] [3:Cataracts, Chronic Obstructive Pulmonary Apnea, Angina,  Hypertension, Type II Diabetes, Gout,] Date Acquired: [1:12/31/2014] [2:12/31/2014] [3:10/31/2015] Weeks of Treatment: [1:27] [2:27] [3:27] Wound Status: [1:Open] [2:Open] [3:Open] Measurements L x W x D 12x10x0.1 [2:14x14x0.1] [3:0.4x1.8x0.2] (cm) Area (cm) : [1:94.248] [2:153.938] [3:0.565] Volume (cm) : [1:9.425] [2:15.394] [3:0.113] % Reduction in Area: [1:-284.60%] [2:-65128.00%] [3:92.00%] % Reduction in Volume: -284.70% [2:-64041.70%]  [3:92.00%] Classification: [1:Partial Thickness] [2:Partial Thickness] [3:Category/Stage III] HBO Classification: [1:Grade 1] [2:Grade 1] [3:Grade 1] Exudate Amount: [1:Large] [2:Large] [3:Large] Exudate Type: [1:Serous] [2:Serosanguineous] [3:Serous] Exudate Color: [1:amber] [2:red, brown] [3:amber] Wound Margin: [1:Flat and Intact] [2:Flat and Intact] [3:Flat and Intact] Granulation Amount: [1:Large (67-100%)] [2:Large (67-100%)] [3:Medium (34-66%)] Granulation Quality: [1:Red, Pink] [2:Red, Pink] [3:Red, Pink] Necrotic Amount: [1:Small (1-33%)] [2:Small (1-33%)] [3:Medium (34-66%)] Exposed Structures: Fascia: No Fascia: No Fascia: No Fat: No Fat: No Fat: No Tendon: No Tendon: No Tendon: No Muscle: No Muscle: No Muscle: No Joint: No Joint: No Joint: No Bone: No Bone: No Bone: No Limited to Skin Limited to Skin Limited to Skin Breakdown Breakdown Breakdown Epithelialization: Medium (34-66%) Medium (34-66%) None Periwound Skin Texture: Edema: Yes Edema: Yes Edema: Yes Periwound Skin Moist: Yes Moist: Yes Maceration: Yes Moisture: Maceration: No Moist: Yes Periwound Skin Color: Erythema: Yes Erythema: Yes Erythema: Yes Erythema Location: Circumferential Circumferential Circumferential Temperature: No Abnormality No Abnormality No Abnormality Tenderness on Yes Yes Yes Palpation: Wound Preparation: Ulcer Cleansing: Other: Ulcer Cleansing: Other: Ulcer Cleansing: soap and water soap and water Rinsed/Irrigated with Saline Topical Anesthetic Topical Anesthetic Applied: None Applied: None Topical Anesthetic Applied: Other: lidocaine 4% Treatment Notes Electronic Signature(s) Signed: 07/11/2016 5:32:33 PM By: [name] [name], RN Entered By: [name] on 07/11/2016 14:10:06 [name] ([id number]) -------------------------------------------------------------------------------- Multi-Disciplinary Care Plan Details Patient Name: [name] Date of  Service: 07/11/2016 1:30 PM Medical Record Number: [id number] Patient Account Number: [id number] Date of Birth/Sex: August 18, 1946 (70 y.o. Male) Treating RN: [name] Mealy, RN, [name], [place] Sink Primary Care Physician: [name] Milch Other Clinician: Referring Physician: Oretha Milch Treating Physician/Extender: [name] Re in Treatment: 2 Active Inactive Abuse / Safety / Falls / Self Care Management Nursing Diagnoses: Potential for falls Goals: Patient will remain injury free Date Initiated: 03/01/2016 Goal Status: Active Interventions: Assess fall risk on admission and as needed Notes: Nutrition Nursing Diagnoses: Imbalanced nutrition Goals: Patient/caregiver agrees to and verbalizes understanding of need to use nutritional supplements and/or vitamins as prescribed Date Initiated: 03/01/2016 Goal Status: Active Interventions: Assess patient nutrition upon admission and as needed per policy Notes: Orientation to the Wound Care Program Nursing Diagnoses: Knowledge deficit related to the wound healing center program Goals: Patient/caregiver will verbalize understanding of the Wound Healing Center [address] [place], ZWILLING ([id number]) Date Initiated: 03/01/2016 Goal Status: Active Interventions: Provide education on orientation to the wound center Notes: Pain, Acute or Chronic Nursing Diagnoses: Pain, acute or chronic: actual or potential Potential alteration in comfort, pain Goals: Patient will verbalize adequate pain control and receive pain control interventions during procedures as needed Date Initiated: 03/01/2016 Goal Status: Active Patient/caregiver will  verbalize adequate pain control between visits Date Initiated: 03/01/2016 Goal Status: Active Interventions: Assess comfort goal upon admission Complete pain assessment as per visit requirements Notes: Pressure Nursing Diagnoses: Knowledge deficit related to causes and risk factors for pressure ulcer development Knowledge  deficit related to management of pressures ulcers Goals: Patient/caregiver will verbalize risk factors for pressure ulcer development Date Initiated: 03/01/2016 Goal Status: Active Interventions: Assess offloading mechanisms upon admission and as needed Notes: Wound/Skin Impairment Nursing Diagnoses: HARRELL, NIEHOFF ([id number]) Impaired tissue integrity Goals: Ulcer/skin breakdown will have a volume reduction of 30% by week 4 Date Initiated: 03/01/2016 Goal Status: Active Ulcer/skin breakdown will have a volume reduction of 50% by week 8 Date Initiated: 03/01/2016 Goal Status: Active Ulcer/skin breakdown will have a volume reduction of 80% by week 12 Date Initiated: 03/01/2016 Goal Status: Active Interventions: Assess ulceration(s) every visit Notes: Electronic Signature(s) Signed: 07/11/2016 5:32:33 PM By: [name] [name], RN Entered By: [name] on 07/11/2016 14:09:50 [name], [name] Seth Bake ([id number]) -------------------------------------------------------------------------------- Pain Assessment Details Patient Name: [name] Date of Service: 07/11/2016 1:30 PM Medical Record Number: [id number] Patient Account Number: [id number] Date of Birth/Sex: 30-May-1946 (70 y.o. Male) Treating RN: [name] Haggis Primary Care Physician: [name] Milch Other Clinician: Referring Physician: Oretha Milch Treating Physician/Extender: [name] Re in Treatment: 27 Active Problems Location of Pain Severity and Description of Pain Patient Has Paino No Site Locations With Dressing Change: No Pain Management and Medication Current Pain Management: Electronic Signature(s) Signed: 07/11/2016 4:42:28 PM By: [name] Mulling Entered By: [name] Mulling on 07/11/2016 13:44:15 [name] ([id number]) -------------------------------------------------------------------------------- Patient/Caregiver Education Details Patient Name: [name] Date of Service: 07/11/2016  1:30 PM Medical Record Number: [id number] Patient Account Number: [id number] Date of Birth/Gender: August 15, 1946 (70 y.o. Male) Treating RN: [name] Haggis Primary Care Physician: [name] Milch Other Clinician: Referring Physician: Oretha Milch Treating Physician/Extender: [name] Re in Treatment: 32 Education Assessment Education Provided To: Patient Education Topics Provided Wound/Skin Impairment: Handouts: Other: change dressing as ordered and keep wraps dry and clean Methods: Demonstration, Explain/Verbal Responses: State content correctly Electronic Signature(s) Signed: 07/11/2016 4:42:28 PM By: [name] Mulling Entered By: [name] Mulling on 07/11/2016 14:46:41 Tobey, [name] ([id number]) -------------------------------------------------------------------------------- Wound Assessment Details Patient Name: [name] Date of Service: 07/11/2016 1:30 PM Medical Record Number: [id number] Patient Account Number: [id number] Date of Birth/Sex: 11/26/1945 (70 y.o. Male) Treating RN: [name] Haggis Primary Care Physician: [name] Milch Other Clinician: Referring Physician: Oretha Milch Treating Physician/Extender: [name] Re in Treatment: 27 Wound Status Wound Number: 1 Primary Venous Leg Ulcer Etiology: Wound Location: Left Lower Leg Wound Open Wounding Event: Gradually Appeared Status: Date Acquired: 12/31/2014 Comorbid Cataracts, Chronic Obstructive Weeks Of Treatment: 27 History: Pulmonary Disease (COPD), Sleep Clustered Wound: No Apnea, Angina, Congestive Heart Failure, Hypertension, Type II Diabetes, Gout, Osteoarthritis, Neuropathy Photos Photo Uploaded By: [name] Mulling on 07/11/2016 15:08:29 Wound Measurements Length: (cm) 12 Width: (cm) 10 Depth: (cm) 0.1 Area: (cm) 94.248 Volume: (cm) 9.425 % Reduction in Area: -284.6% % Reduction in Volume: -284.7% Epithelialization: Medium (34-66%) Tunneling: No Wound  Description Classification: Partial Thickness Foul Odor Af Diabetic Severity [address]): Grade 1 Wound Margin: Flat and Intact Exudate Amount: Large Exudate Type: Serous Exudate Color: amber ter Cleansing: No Wound Bed Granulation Amount: Large (67-100%) Exposed Structure Coultas, [name] ([id number]) Granulation Quality: Red, Pink Fascia Exposed: No Necrotic Amount: Small (1-33%) Fat Layer Exposed: No Necrotic Quality: Adherent Slough Tendon Exposed: No Muscle Exposed: No Joint Exposed: No Bone Exposed: No Limited to Skin Breakdown Periwound  Skin Texture Texture Color No Abnormalities Noted: No No Abnormalities Noted: No Localized Edema: Yes Erythema: Yes Erythema Location: Circumferential Moisture No Abnormalities Noted: No Temperature / Pain Moist: Yes Temperature: No Abnormality Tenderness on Palpation: Yes Wound Preparation Ulcer Cleansing: Other: soap and water, Topical Anesthetic Applied: None Treatment Notes Wound #1 (Left Lower Leg) 1. Cleansed with: Cleanse wound with antibacterial soap and water 3. Peri-wound Care: Barrier cream 4. Dressing Applied: Aquacel Ag 5. Secondary Dressing Applied ABD Pad 7. Secured with Tape 3 Layer Compression System - Bilateral Notes unna to anchor Electronic Signature(s) Signed: 07/11/2016 4:42:28 PM By: [name] Mulling Entered By: [name] Mulling on 07/11/2016 14:03:07 Patti, Nick V. ([id number]) -------------------------------------------------------------------------------- Wound Assessment Details Patient Name: [name] Date of Service: 07/11/2016 1:30 PM Medical Record Number: [id number] Patient Account Number: [id number] Date of Birth/Sex: 04/09/1946 (70 y.o. Male) Treating RN: [name] Haggis Primary Care Physician: [name] Milch Other Clinician: Referring Physician: Oretha Milch Treating Physician/Extender: [name] Re in Treatment: 27 Wound Status Wound Number: 2 Primary Venous Leg  Ulcer Etiology: Wound Location: Right Lower Leg Wound Open Wounding Event: Gradually Appeared Status: Date Acquired: 12/31/2014 Comorbid Cataracts, Chronic Obstructive Weeks Of Treatment: 27 History: Pulmonary Disease (COPD), Sleep Clustered Wound: No Apnea, Angina, Congestive Heart Failure, Hypertension, Type II Diabetes, Gout, Osteoarthritis, Neuropathy Photos Photo Uploaded By: [name] Mulling on 07/11/2016 15:08:42 Wound Measurements Length: (cm) 14 Width: (cm) 14 Depth: (cm) 0.1 Area: (cm) 153.938 Volume: (cm) 15.394 % Reduction in Area: -65128% % Reduction in Volume: -64041.7% Epithelialization: Medium (34-66%) Tunneling: No Undermining: No Wound Description Classification: Partial Thickness Foul Odor A Diabetic Severity (Wagner): Grade 1 Wound Margin: Flat and Intact Exudate Amount: Large Exudate Type: Serosanguineous Exudate Color: red, brown fter Cleansing: No Wound Bed Granulation Amount: Large (67-100%) Exposed Structure [name], [name] ([id number]) Granulation Quality: Red, Pink Fascia Exposed: No Necrotic Amount: Small (1-33%) Fat Layer Exposed: No Necrotic Quality: Adherent Slough Tendon Exposed: No Muscle Exposed: No Joint Exposed: No Bone Exposed: No Limited to Skin Breakdown Periwound Skin Texture Texture Color No Abnormalities Noted: No No Abnormalities Noted: No Localized Edema: Yes Erythema: Yes Erythema Location: Circumferential Moisture No Abnormalities Noted: No Temperature / Pain Maceration: No Temperature: No Abnormality Moist: Yes Tenderness on Palpation: Yes Wound Preparation Ulcer Cleansing: Other: soap and water, Topical Anesthetic Applied: None Treatment Notes Wound #2 (Right Lower Leg) 1. Cleansed with: Cleanse wound with antibacterial soap and water 3. Peri-wound Care: Barrier cream 4. Dressing Applied: Aquacel Ag 5. Secondary Dressing Applied ABD Pad 7. Secured with Tape 3 Layer Compression System -  Bilateral Notes unna to anchor Electronic Signature(s) Signed: 07/11/2016 4:42:28 PM By: [name] Mulling Entered By: [name] Mulling on 07/11/2016 14:04:03 [name], [name] ([id number]) -------------------------------------------------------------------------------- Wound Assessment Details Patient Name: [name] Date of Service: 07/11/2016 1:30 PM Medical Record Number: [id number] Patient Account Number: [id number] Date of Birth/Sex: 11/25/1945 (70 y.o. Male) Treating RN: [name] Haggis Primary Care Physician: [name] Milch Other Clinician: Referring Physician: Oretha Milch Treating Physician/Extender: [name] Re in Treatment: 27 Wound Status Wound Number: 3 Primary Pressure Ulcer Etiology: Wound Location: Right Calcaneus Wound Open Wounding Event: Pressure Injury Status: Date Acquired: 10/31/2015 Comorbid Cataracts, Chronic Obstructive Weeks Of Treatment: 27 History: Pulmonary Disease (COPD), Sleep Clustered Wound: No Apnea, Angina, Congestive Heart Failure, Hypertension, Type II Diabetes, Gout, Osteoarthritis, Neuropathy Photos Photo Uploaded By: [name] Mulling on 07/11/2016 15:08:58 Wound Measurements Length: (cm) 0.4 Width: (cm) 1.8 Depth: (cm) 0.2 Area: (cm) 0.565 Volume: (cm) 0.113 % Reduction in Area: 92% %  Reduction in Volume: 92% Epithelialization: None Tunneling: No Undermining: No Wound Description Classification: Category/Stage III Foul Odor A Diabetic Severity (Wagner): Grade 1 Wound Margin: Flat and Intact Exudate Amount: Large Exudate Type: Serous Exudate Color: amber fter Cleansing: No Wound Bed Granulation Amount: Medium (34-66%) Exposed Structure [name], [name] ([id number]) Granulation Quality: Red, Pink Fascia Exposed: No Necrotic Amount: Medium (34-66%) Fat Layer Exposed: No Necrotic Quality: Adherent Slough Tendon Exposed: No Muscle Exposed: No Joint Exposed: No Bone Exposed: No Limited to Skin  Breakdown Periwound Skin Texture Texture Color No Abnormalities Noted: No No Abnormalities Noted: No Localized Edema: Yes Erythema: Yes Erythema Location: Circumferential Moisture No Abnormalities Noted: No Temperature / Pain Maceration: Yes Temperature: No Abnormality Moist: Yes Tenderness on Palpation: Yes Wound Preparation Ulcer Cleansing: Rinsed/Irrigated with Saline Topical Anesthetic Applied: Other: lidocaine 4%, Treatment Notes Wound #3 (Right Calcaneus) 1. Cleansed with: Cleanse wound with antibacterial soap and water 2. Anesthetic Topical Lidocaine 4% cream to wound bed prior to debridement 4. Dressing Applied: Prisma Ag 5. Secondary Dressing Applied ABD Pad Dry Gauze Kerlix/Conform 7. Secured with Secretary/administrator) Signed: 07/11/2016 4:42:28 PM By: [name] Mulling Entered By: [name] Mulling on 07/11/2016 14:04:56 [name] ([id number]) -------------------------------------------------------------------------------- Vitals Details Patient Name: [name] Date of Service: 07/11/2016 1:30 PM Medical Record Number: [id number] Patient Account Number: [id number] Date of Birth/Sex: [birth year] (70 y.o. Male) Treating RN: [name] Haggis Primary Care Physician: [name] Milch Other Clinician: Referring Physician: Oretha Milch Treating Physician/Extender: [name] Re in Treatment: 106 Vital Signs Time Taken: 13:44 Temperature (F): 97.7 Height (in): 70 Pulse (bpm): 78 Weight (lbs): 235 Respiratory Rate (breaths/min): 18 Body Mass Index (BMI): 33.7 Blood Pressure (mmHg): 140/76 Reference Range: 80 - 120 mg / dl Electronic Signature(s) Signed: 07/11/2016 4:42:28 PM By: [name] Mulling Entered By: [name] Mulling on 07/11/2016 13:46:40

## 2016-07-18 ENCOUNTER — Encounter: Payer: Medicare Other | Attending: Surgery | Admitting: Surgery

## 2016-07-18 DIAGNOSIS — M70871 Other soft tissue disorders related to use, overuse and pressure, right ankle and foot: Secondary | ICD-10-CM | POA: Insufficient documentation

## 2016-07-18 DIAGNOSIS — I70234 Atherosclerosis of native arteries of right leg with ulceration of heel and midfoot: Secondary | ICD-10-CM | POA: Insufficient documentation

## 2016-07-18 DIAGNOSIS — G4733 Obstructive sleep apnea (adult) (pediatric): Secondary | ICD-10-CM | POA: Diagnosis not present

## 2016-07-18 DIAGNOSIS — I890 Lymphedema, not elsewhere classified: Secondary | ICD-10-CM | POA: Diagnosis not present

## 2016-07-18 DIAGNOSIS — I5032 Chronic diastolic (congestive) heart failure: Secondary | ICD-10-CM | POA: Diagnosis not present

## 2016-07-18 DIAGNOSIS — L89613 Pressure ulcer of right heel, stage 3: Secondary | ICD-10-CM | POA: Insufficient documentation

## 2016-07-18 DIAGNOSIS — Z87891 Personal history of nicotine dependence: Secondary | ICD-10-CM | POA: Insufficient documentation

## 2016-07-18 DIAGNOSIS — I2510 Atherosclerotic heart disease of native coronary artery without angina pectoris: Secondary | ICD-10-CM | POA: Diagnosis not present

## 2016-07-18 DIAGNOSIS — E11621 Type 2 diabetes mellitus with foot ulcer: Secondary | ICD-10-CM | POA: Diagnosis not present

## 2016-07-18 DIAGNOSIS — I110 Hypertensive heart disease with heart failure: Secondary | ICD-10-CM | POA: Insufficient documentation

## 2016-07-18 DIAGNOSIS — Z6833 Body mass index (BMI) 33.0-33.9, adult: Secondary | ICD-10-CM | POA: Insufficient documentation

## 2016-07-18 DIAGNOSIS — J449 Chronic obstructive pulmonary disease, unspecified: Secondary | ICD-10-CM | POA: Insufficient documentation

## 2016-07-19 NOTE — Progress Notes (Signed)
[name], [name] ([id number]) Visit Report for 07/18/2016 Arrival Information Details Patient Name: [name], [name] Date of Service: 07/18/2016 9:45 AM Medical Record Number: [id number] Patient Account Number: [id number] Date of Birth/Sex: 08-18-1946 (70 y.o. Male) Treating RN: Phillis Haggis Primary Care Physician: Oretha Milch Other Clinician: Referring Physician: Oretha Milch Treating Physician/Extender: Rudene Re in Treatment: 28 Visit Information History Since Last Visit All ordered tests and consults were completed: No Patient Arrived: Wheel Chair Added or deleted any medications: No Arrival Time: 10:30 Any new allergies or adverse reactions: No Accompanied By: self Had a fall or experienced change in No Transfer Assistance: EasyPivot activities of daily living that may affect Patient Lift risk of falls: Patient Identification Verified: Yes Signs or symptoms of abuse/neglect since last No Secondary Verification Process Yes visito Completed: Hospitalized since last visit: No Patient Requires Transmission- No Pain Present Now: No Based Precautions: Patient Has Alerts: Yes Patient Alerts: DM II Electronic Signature(s) Signed: 07/18/2016 5:34:49 PM By: Alejandro Mulling Entered By: Alejandro Mulling on 07/18/2016 10:30:50 [name] ([id number]) -------------------------------------------------------------------------------- Clinic Level of Care Assessment Details Patient Name: [name] Date of Service: 07/18/2016 9:45 AM Medical Record Number: [id number] Patient Account Number: [id number] Date of Birth/Sex: Mar 23, 1946 (70 y.o. Male) Treating RN: [name], RN, [name], Rita Primary Care Physician: Oretha Milch Other Clinician: Referring Physician: Oretha Milch Treating Physician/Extender: Rudene Re in Treatment: 28 Clinic Level of Care Assessment Items TOOL 4 Quantity Score []  - Use when only an EandM is performed on FOLLOW-UP visit 0 ASSESSMENTS  - Nursing Assessment / Reassessment X - Reassessment of Co-morbidities (includes updates in patient status) 1 10 X - Reassessment of Adherence to Treatment Plan 1 5 ASSESSMENTS - Wound and Skin Assessment / Reassessment []  - Simple Wound Assessment / Reassessment - one wound 0 X - Complex Wound Assessment / Reassessment - multiple wounds 3 5 []  - Dermatologic / Skin Assessment (not related to wound area) 0 ASSESSMENTS - Focused Assessment []  - Circumferential Edema Measurements - multi extremities 0 []  - Nutritional Assessment / Counseling / Intervention 0 []  - Lower Extremity Assessment (monofilament, tuning fork, pulses) 0 []  - Peripheral Arterial Disease Assessment (using hand held doppler) 0 ASSESSMENTS - Ostomy and/or Continence Assessment and Care []  - Incontinence Assessment and Management 0 []  - Ostomy Care Assessment and Management (repouching, etc.) 0 PROCESS - Coordination of Care X - Simple Patient / Family Education for ongoing care 1 15 []  - Complex (extensive) Patient / Family Education for ongoing care 0 []  - Staff obtains Chiropractor, Records, Test Results / Process Orders 0 []  - Staff telephones HHA, Nursing Homes / Clarify orders / etc 0 []  - Routine Transfer to another Facility (non-emergent condition) 0 [name], [name] ([id number]) []  - Routine Hospital Admission (non-emergent condition) 0 []  - New Admissions / Manufacturing engineer / Ordering NPWT, Apligraf, etc. 0 []  - Emergency Hospital Admission (emergent condition) 0 []  - Simple Discharge Coordination 0 []  - Complex (extensive) Discharge Coordination 0 PROCESS - Special Needs []  - Pediatric / Minor Patient Management 0 []  - Isolation Patient Management 0 []  - Hearing / Language / Visual special needs 0 []  - Assessment of Community assistance (transportation, D/C planning, etc.) 0 []  - Additional assistance / Altered mentation 0 []  - Support Surface(s) Assessment (bed, cushion, seat, etc.) 0 INTERVENTIONS -  Wound Cleansing / Measurement []  - Simple Wound Cleansing - one wound 0 X - Complex Wound Cleansing - multiple wounds 3 5 []  - Wound Imaging (photographs -  any number of wounds) 0 []  - Wound Tracing (instead of photographs) 0 []  - Simple Wound Measurement - one wound 0 X - Complex Wound Measurement - multiple wounds 3 5 INTERVENTIONS - Wound Dressings []  - Small Wound Dressing one or multiple wounds 0 X - Medium Wound Dressing one or multiple wounds 3 15 []  - Large Wound Dressing one or multiple wounds 0 []  - Application of Medications - topical 0 []  - Application of Medications - injection 0 INTERVENTIONS - Miscellaneous []  - External ear exam 0 [name], [name] ([id number]) []  - Specimen Collection (cultures, biopsies, blood, body fluids, etc.) 0 []  - Specimen(s) / Culture(s) sent or taken to Lab for analysis 0 []  - Patient Transfer (multiple staff / Michiel Sites Lift / Similar devices) 0 []  - Simple Staple / Suture removal (25 or less) 0 []  - Complex Staple / Suture removal (26 or more) 0 []  - Hypo / Hyperglycemic Management (close monitor of Blood Glucose) 0 []  - Ankle / Brachial Index (ABI) - do not check if billed separately 0 X - Vital Signs 1 5 Has the patient been seen at the hospital within the last three years: Yes Total Score: 125 Level Of Care: New/Established - Level 4 Electronic Signature(s) Signed: 07/18/2016 5:31:48 PM By: Elpidio Eric [name], RN Entered By: Elpidio Eric on 07/18/2016 11:03:01 [name] ([id number]) -------------------------------------------------------------------------------- Encounter Discharge Information Details Patient Name: [name] Date of Service: 07/18/2016 9:45 AM Medical Record Number: [id number] Patient Account Number: [id number] Date of Birth/Sex: 18-Sep-1946 (70 y.o. Male) Treating RN: Phillis Haggis Primary Care Physician: Oretha Milch Other Clinician: Referring Physician: Oretha Milch Treating Physician/Extender: Rudene Re in Treatment: 25 Encounter Discharge Information Items Discharge Pain Level: 0 Discharge Condition: Stable Ambulatory Status: Wheelchair Discharge Destination: Nursing Home Transportation: Other Accompanied By: self Schedule Follow-up Appointment: Yes Medication Reconciliation completed Yes and provided to Patient/Care Kaina Orengo: Provided on Clinical Summary of Care: 07/18/2016 Form Type Recipient Paper Patient EL Electronic Signature(s) Signed: 07/18/2016 5:34:49 PM By: Alejandro Mulling Previous Signature: 07/18/2016 11:34:38 AM Version By: Gwenlyn Perking Entered By: Alejandro Mulling on 07/18/2016 11:37:10 [name] ([id number]) -------------------------------------------------------------------------------- Lower Extremity Assessment Details Patient Name: [name] Date of Service: 07/18/2016 9:45 AM Medical Record Number: [id number] Patient Account Number: [id number] Date of Birth/Sex: 08-17-1946 (70 y.o. Male) Treating RN: Phillis Haggis Primary Care Physician: Oretha Milch Other Clinician: Referring Physician: Oretha Milch Treating Physician/Extender: Rudene Re in Treatment: 28 Edema Assessment Assessed: [Left: No] [Right: No] E[Left: dema] [Right: :] Calf Left: Right: Point of Measurement: 34 cm From Medial Instep 38 cm 34.8 cm Ankle Left: Right: Point of Measurement: 12 cm From Medial Instep 26 cm 23.4 cm Vascular Assessment Pulses: Posterior Tibial Dorsalis Pedis Palpable: [Left:Yes] [Right:Yes] Extremity colors, hair growth, and conditions: Temperature of Extremity: [Left:Warm] [Right:Warm] Capillary Refill: [Left:< 3 seconds] [Right:< 3 seconds] Electronic Signature(s) Signed: 07/18/2016 5:34:49 PM By: Alejandro Mulling Entered By: Alejandro Mulling on 07/18/2016 10:31:41 [name], [name] ([id number]) -------------------------------------------------------------------------------- Multi Wound Chart Details Patient Name: [name] Date of Service: 07/18/2016 9:45 AM Medical Record Number: [id number] Patient Account Number: [id number] Date of Birth/Sex: 08/19/1946 (70 y.o. Male) Treating RN: [name], RN, [name], [place] Sink Primary Care Physician: Oretha Milch Other Clinician: Referring Physician: Oretha Milch Treating Physician/Extender: Rudene Re in Treatment: 28 Vital Signs Height(in): 70 Pulse(bpm): 60 Weight(lbs): 235 Blood Pressure 124/73 (mmHg): Body Mass Index(BMI): 34 Temperature(F): Respiratory Rate 18 (breaths/min): Photos: [1:No Photos] [2:No Photos] [3:No Photos] Wound Location: [1:Left Lower  Leg] [2:Right Lower Leg] [3:Right Calcaneus] Wounding Event: [1:Gradually Appeared] [2:Gradually Appeared] [3:Pressure Injury] Primary Etiology: [1:Venous Leg Ulcer] [2:Venous Leg Ulcer] [3:Pressure Ulcer] Comorbid History: [1:Cataracts, Chronic Obstructive Pulmonary Disease (COPD), Sleep Disease (COPD), Sleep Disease (COPD), Sleep Apnea, Angina, Congestive Heart Failure, Congestive Heart Failure, Congestive Heart Failure, Hypertension, Type II Diabetes,  Gout, Osteoarthritis, Neuropathy Osteoarthritis, Neuropathy Osteoarthritis, Neuropathy] [2:Cataracts, Chronic Obstructive Pulmonary Apnea, Angina, Hypertension, Type II Diabetes, Gout,] [3:Cataracts, Chronic Obstructive Pulmonary Apnea, Angina,  Hypertension, Type II Diabetes, Gout,] Date Acquired: [1:12/31/2014] [2:12/31/2014] [3:10/31/2015] Weeks of Treatment: [1:28] [2:28] [3:28] Wound Status: [1:Open] [2:Open] [3:Open] Measurements L x W x D 6x4x0.1 [2:3x3x0.1] [3:0.4x1.5x0.2] (cm) Area (cm) : [1:18.85] [2:7.069] [3:0.471] Volume (cm) : [1:1.885] [2:0.707] [3:0.094] % Reduction in Area: [1:23.10%] [2:-2895.30%] [3:93.30%] % Reduction in Volume: 23.10% [2:-2845.80%] [3:93.40%] Classification: [1:Partial Thickness] [2:Partial Thickness] [3:Category/Stage III] HBO Classification: [1:Grade 1] [2:Grade 1] [3:Grade 1] Exudate Amount: [1:Large]  [2:Large] [3:Large] Exudate Type: [1:Serous] [2:Serous] [3:Serous] Exudate Color: [1:amber] [2:amber] [3:amber] Wound Margin: [1:Flat and Intact] [2:Flat and Intact] [3:Flat and Intact] Granulation Amount: [1:Large (67-100%)] [2:Large (67-100%)] [3:Medium (34-66%)] Granulation Quality: [1:Red, Pink] [2:Red, Pink] [3:Red, Pink] Necrotic Amount: [1:Small (1-33%)] [2:Small (1-33%)] [3:Medium (34-66%)] Exposed Structures: Fascia: No Fascia: No Fascia: No Fat: No Fat: No Fat: No Tendon: No Tendon: No Tendon: No Muscle: No Muscle: No Muscle: No Joint: No Joint: No Joint: No Bone: No Bone: No Bone: No Limited to Skin Limited to Skin Limited to Skin Breakdown Breakdown Breakdown Epithelialization: Medium (34-66%) Medium (34-66%) None Periwound Skin Texture: Edema: Yes Edema: Yes Edema: Yes Periwound Skin Moist: Yes Moist: Yes Maceration: Yes Moisture: Maceration: No Moist: Yes Periwound Skin Color: Erythema: Yes Erythema: Yes Erythema: Yes Erythema Location: Circumferential Circumferential Circumferential Temperature: No Abnormality No Abnormality No Abnormality Tenderness on Yes Yes Yes Palpation: Wound Preparation: Ulcer Cleansing: Other: Ulcer Cleansing: Other: Ulcer Cleansing: soap and water soap and water Rinsed/Irrigated with Saline Topical Anesthetic Topical Anesthetic Applied: None Applied: None Topical Anesthetic Applied: Other: lidocaine 4% Treatment Notes Electronic Signature(s) Signed: 07/18/2016 5:31:48 PM By: Elpidio Eric [name], RN Entered By: Elpidio Eric on 07/18/2016 10:56:40 [name] ([id number]) -------------------------------------------------------------------------------- Multi-Disciplinary Care Plan Details Patient Name: [name] Date of Service: 07/18/2016 9:45 AM Medical Record Number: [id number] Patient Account Number: [id number] Date of Birth/Sex: 02-19-1946 (70 y.o. Male) Treating RN: [name], RN, [name], [place] Sink Primary Care Physician:  Oretha Milch Other Clinician: Referring Physician: Oretha Milch Treating Physician/Extender: Rudene Re in Treatment: 75 Active Inactive Abuse / Safety / Falls / Self Care Management Nursing Diagnoses: Potential for falls Goals: Patient will remain injury free Date Initiated: 03/01/2016 Goal Status: Active Interventions: Assess fall risk on admission and as needed Notes: Nutrition Nursing Diagnoses: Imbalanced nutrition Goals: Patient/caregiver agrees to and verbalizes understanding of need to use nutritional supplements and/or vitamins as prescribed Date Initiated: 03/01/2016 Goal Status: Active Interventions: Assess patient nutrition upon admission and as needed per policy Notes: Orientation to the Wound Care Program Nursing Diagnoses: Knowledge deficit related to the wound healing center program Goals: Patient/caregiver will verbalize understanding of the [facility] [address] [name], [name] ([id number]) Date Initiated: 03/01/2016 Goal Status: Active Interventions: Provide education on orientation to the wound center Notes: Pain, Acute or Chronic Nursing Diagnoses: Pain, acute or chronic: actual or potential Potential alteration in comfort, pain Goals: Patient will verbalize adequate pain control and receive pain control interventions during procedures as needed Date Initiated: 03/01/2016 Goal Status: Active Patient/caregiver will verbalize adequate pain control between visits Date Initiated: 03/01/2016 Goal  Status: Active Interventions: Assess comfort goal upon admission Complete pain assessment as per visit requirements Notes: Pressure Nursing Diagnoses: Knowledge deficit related to causes and risk factors for pressure ulcer development Knowledge deficit related to management of pressures ulcers Goals: Patient/caregiver will verbalize risk factors for pressure ulcer development Date Initiated: 03/01/2016 Goal Status:  Active Interventions: Assess offloading mechanisms upon admission and as needed Notes: Wound/Skin Impairment Nursing Diagnoses: [name], SALAHUDDIN. ([id number]) Impaired tissue integrity Goals: Ulcer/skin breakdown will have a volume reduction of 30% by week 4 Date Initiated: 03/01/2016 Goal Status: Active Ulcer/skin breakdown will have a volume reduction of 50% by week 8 Date Initiated: 03/01/2016 Goal Status: Active Ulcer/skin breakdown will have a volume reduction of 80% by week 12 Date Initiated: 03/01/2016 Goal Status: Active Interventions: Assess ulceration(s) every visit Notes: Electronic Signature(s) Signed: 07/18/2016 5:31:48 PM By: Elpidio Eric [name], RN Entered By: Elpidio Eric on 07/18/2016 10:56:28 [name] ([id number]) -------------------------------------------------------------------------------- Pain Assessment Details Patient Name: [name] Date of Service: 07/18/2016 9:45 AM Medical Record Number: [id number] Patient Account Number: [id number] Date of Birth/Sex: February 03, 1946 (70 y.o. Male) Treating RN: Phillis Haggis Primary Care Physician: Oretha Milch Other Clinician: Referring Physician: Oretha Milch Treating Physician/Extender: Rudene Re in Treatment: 28 Active Problems Location of Pain Severity and Description of Pain Patient Has Paino No Site Locations With Dressing Change: No Pain Management and Medication Current Pain Management: Electronic Signature(s) Signed: 07/18/2016 5:34:49 PM By: Alejandro Mulling Entered By: Alejandro Mulling on 07/18/2016 10:30:55 [name] ([id number]) -------------------------------------------------------------------------------- Patient/Caregiver Education Details Patient Name: [name] Date of Service: 07/18/2016 9:45 AM Medical Record Number: [id number] Patient Account Number: [id number] Date of Birth/Gender: 03-03-1946 (70 y.o. Male) Treating RN: Phillis Haggis Primary Care Physician:  Oretha Milch Other Clinician: Referring Physician: Oretha Milch Treating Physician/Extender: Rudene Re in Treatment: 22 Education Assessment Education Provided To: Patient Education Topics Provided Wound/Skin Impairment: Handouts: Other: change dressing as ordered and keep wraps dry and clean Methods: Demonstration, Explain/Verbal Responses: State content correctly Electronic Signature(s) Signed: 07/18/2016 5:34:49 PM By: Alejandro Mulling Entered By: Alejandro Mulling on 07/18/2016 11:42:30 [name], [name] ([id number]) -------------------------------------------------------------------------------- Wound Assessment Details Patient Name: [name] Date of Service: 07/18/2016 9:45 AM Medical Record Number: [id number] Patient Account Number: [id number] Date of Birth/Sex: 02/12/1946 (70 y.o. Male) Treating RN: Phillis Haggis Primary Care Physician: Oretha Milch Other Clinician: Referring Physician: Oretha Milch Treating Physician/Extender: Rudene Re in Treatment: 28 Wound Status Wound Number: 1 Primary Venous Leg Ulcer Etiology: Wound Location: Left Lower Leg Wound Open Wounding Event: Gradually Appeared Status: Date Acquired: 12/31/2014 Comorbid Cataracts, Chronic Obstructive Weeks Of Treatment: 28 History: Pulmonary Disease (COPD), Sleep Clustered Wound: No Apnea, Angina, Congestive Heart Failure, Hypertension, Type II Diabetes, Gout, Osteoarthritis, Neuropathy Photos Photo Uploaded By: Alejandro Mulling on 07/18/2016 14:37:37 Wound Measurements Length: (cm) 6 Width: (cm) 4 Depth: (cm) 0.1 Area: (cm) 18.85 Volume: (cm) 1.885 % Reduction in Area: 23.1% % Reduction in Volume: 23.1% Epithelialization: Medium (34-66%) Tunneling: No Undermining: No Wound Description Classification: Partial Thickness Foul Odor Af Diabetic Severity [address]): Grade 1 Wound Margin: Flat and Intact Exudate Amount: Large Exudate Type: Serous Exudate Color:  amber ter Cleansing: No Wound Bed Granulation Amount: Large (67-100%) Exposed Structure [name], [name] ([id number]) Granulation Quality: Red, Pink Fascia Exposed: No Necrotic Amount: Small (1-33%) Fat Layer Exposed: No Necrotic Quality: Adherent Slough Tendon Exposed: No Muscle Exposed: No Joint Exposed: No Bone Exposed: No Limited to Skin Breakdown Periwound Skin Texture Texture Color No Abnormalities Noted: No  No Abnormalities Noted: No Localized Edema: Yes Erythema: Yes Erythema Location: Circumferential Moisture No Abnormalities Noted: No Temperature / Pain Moist: Yes Temperature: No Abnormality Tenderness on Palpation: Yes Wound Preparation Ulcer Cleansing: Other: soap and water, Topical Anesthetic Applied: None Treatment Notes Wound #1 (Left Lower Leg) 1. Cleansed with: Cleanse wound with antibacterial soap and water 4. Dressing Applied: Aquacel Ag 5. Secondary Dressing Applied ABD Pad 7. Secured with Tape 3 Layer Compression System - Bilateral Electronic Signature(s) Signed: 07/18/2016 5:34:49 PM By: Alejandro Mulling Entered By: Alejandro Mulling on 07/18/2016 10:46:08 [name] ([id number]) -------------------------------------------------------------------------------- Wound Assessment Details Patient Name: [name] Date of Service: 07/18/2016 9:45 AM Medical Record Number: [id number] Patient Account Number: [id number] Date of Birth/Sex: 04/04/1946 (70 y.o. Male) Treating RN: Phillis Haggis Primary Care Physician: Oretha Milch Other Clinician: Referring Physician: Oretha Milch Treating Physician/Extender: Rudene Re in Treatment: 28 Wound Status Wound Number: 2 Primary Venous Leg Ulcer Etiology: Wound Location: Right Lower Leg Wound Open Wounding Event: Gradually Appeared Status: Date Acquired: 12/31/2014 Comorbid Cataracts, Chronic Obstructive Weeks Of Treatment: 28 History: Pulmonary Disease (COPD), Sleep Clustered  Wound: No Apnea, Angina, Congestive Heart Failure, Hypertension, Type II Diabetes, Gout, Osteoarthritis, Neuropathy Photos Photo Uploaded By: Alejandro Mulling on 07/18/2016 14:37:38 Wound Measurements Length: (cm) 3 Width: (cm) 3 Depth: (cm) 0.1 Area: (cm) 7.069 Volume: (cm) 0.707 % Reduction in Area: -2895.3% % Reduction in Volume: -2845.8% Epithelialization: Medium (34-66%) Tunneling: No Undermining: No Wound Description Classification: Partial Thickness Foul Odor Af Diabetic Severity (Wagner): Grade 1 Wound Margin: Flat and Intact Exudate Amount: Large Exudate Type: Serous Exudate Color: amber ter Cleansing: No Wound Bed Granulation Amount: Large (67-100%) Exposed Structure [name], [name] ([id number]) Granulation Quality: Red, Pink Fascia Exposed: No Necrotic Amount: Small (1-33%) Fat Layer Exposed: No Necrotic Quality: Adherent Slough Tendon Exposed: No Muscle Exposed: No Joint Exposed: No Bone Exposed: No Limited to Skin Breakdown Periwound Skin Texture Texture Color No Abnormalities Noted: No No Abnormalities Noted: No Localized Edema: Yes Erythema: Yes Erythema Location: Circumferential Moisture No Abnormalities Noted: No Temperature / Pain Maceration: No Temperature: No Abnormality Moist: Yes Tenderness on Palpation: Yes Wound Preparation Ulcer Cleansing: Other: soap and water, Topical Anesthetic Applied: None Treatment Notes Wound #2 (Right Lower Leg) 1. Cleansed with: Cleanse wound with antibacterial soap and water 4. Dressing Applied: Aquacel Ag 5. Secondary Dressing Applied ABD Pad 7. Secured with Tape 3 Layer Compression System - Bilateral Electronic Signature(s) Signed: 07/18/2016 5:34:49 PM By: Alejandro Mulling Entered By: Alejandro Mulling on 07/18/2016 10:47:01 [name] ([id number]) -------------------------------------------------------------------------------- Wound Assessment Details Patient Name: [name] Date of Service: 07/18/2016 9:45 AM Medical Record Number: [id number] Patient Account Number: [id number] Date of Birth/Sex: 02-15-1946 (70 y.o. Male) Treating RN: Phillis Haggis Primary Care Physician: Oretha Milch Other Clinician: Referring Physician: Oretha Milch Treating Physician/Extender: Rudene Re in Treatment: 28 Wound Status Wound Number: 3 Primary Pressure Ulcer Etiology: Wound Location: Right Calcaneus Wound Open Wounding Event: Pressure Injury Status: Date Acquired: 10/31/2015 Comorbid Cataracts, Chronic Obstructive Weeks Of Treatment: 28 History: Pulmonary Disease (COPD), Sleep Clustered Wound: No Apnea, Angina, Congestive Heart Failure, Hypertension, Type II Diabetes, Gout, Osteoarthritis, Neuropathy Photos Photo Uploaded By: Alejandro Mulling on 07/18/2016 14:38:19 Wound Measurements Length: (cm) 0.4 Width: (cm) 1.5 Depth: (cm) 0.2 Area: (cm) 0.471 Volume: (cm) 0.094 % Reduction in Area: 93.3% % Reduction in Volume: 93.4% Epithelialization: None Tunneling: No Undermining: No Wound Description Classification: Category/Stage III Foul Odor A Diabetic Severity (Wagner): Grade 1 Wound Margin: Flat and  Intact Exudate Amount: Large Exudate Type: Serous Exudate Color: amber fter Cleansing: No Wound Bed Granulation Amount: Medium (34-66%) Exposed Structure [name], [name] ([id number]) Granulation Quality: Red, Pink Fascia Exposed: No Necrotic Amount: Medium (34-66%) Fat Layer Exposed: No Necrotic Quality: Adherent Slough Tendon Exposed: No Muscle Exposed: No Joint Exposed: No Bone Exposed: No Limited to Skin Breakdown Periwound Skin Texture Texture Color No Abnormalities Noted: No No Abnormalities Noted: No Localized Edema: Yes Erythema: Yes Erythema Location: Circumferential Moisture No Abnormalities Noted: No Temperature / Pain Maceration: Yes Temperature: No Abnormality Moist: Yes Tenderness on Palpation: Yes Wound  Preparation Ulcer Cleansing: Rinsed/Irrigated with Saline Topical Anesthetic Applied: Other: lidocaine 4%, Treatment Notes Wound #3 (Right Calcaneus) 1. Cleansed with: Clean wound with Normal Saline 2. Anesthetic Topical Lidocaine 4% cream to wound bed prior to debridement 4. Dressing Applied: Prisma Ag 5. Secondary Dressing Applied ABD Pad Dry Gauze Kerlix/Conform 7. Secured with Secretary/administrator) Signed: 07/18/2016 5:34:49 PM By: Alejandro Mulling Entered By: Alejandro Mulling on 07/18/2016 10:45:34 [name] ([id number]) -------------------------------------------------------------------------------- Wound Assessment Details Patient Name: [name] Date of Service: 07/18/2016 9:45 AM Medical Record Number: [id number] Patient Account Number: [id number] Date of Birth/Sex: 1946/03/13 (69 y.o. Male) Treating RN: Phillis Haggis Primary Care Physician: Oretha Milch Other Clinician: Referring Physician: Oretha Milch Treating Physician/Extender: Rudene Re in Treatment: 28 Wound Status Wound Number: 8 Primary Diabetic Wound/Ulcer of the Lower Etiology: Extremity Wound Location: Right Toe Third - Dorsal Secondary Trauma, Other Wounding Event: Trauma Etiology: Date Acquired: 07/17/2016 Wound Open Weeks Of Treatment: 0 Status: Clustered Wound: No Comorbid Cataracts, Chronic Obstructive History: Pulmonary Disease (COPD), Sleep Apnea, Angina, Congestive Heart Failure, Hypertension, Type II Diabetes, Gout, Osteoarthritis, Neuropathy Photos Photo Uploaded By: Alejandro Mulling on 07/18/2016 14:38:20 Wound Measurements Length: (cm) 0.5 Width: (cm) 1.7 Depth: (cm) 0.1 Area: (cm) 0.668 Volume: (cm) 0.067 % Reduction in Area: % Reduction in Volume: Epithelialization: None Tunneling: No Undermining: No Wound Description Classification: Grade 1 Foul Odor Afte Wound Margin: Distinct, outline attached Exudate Amount: Large Exudate Type:  Serosanguineous Exudate Color: red, brown r Cleansing: No Wound Bed Weissberg, Remus V. ([id number]) Granulation Amount: Large (67-100%) Exposed Structure Granulation Quality: Red Fascia Exposed: No Necrotic Amount: Small (1-33%) Fat Layer Exposed: No Necrotic Quality: Adherent Slough Tendon Exposed: No Muscle Exposed: No Joint Exposed: No Bone Exposed: No Limited to Skin Breakdown Periwound Skin Texture Texture Color No Abnormalities Noted: No No Abnormalities Noted: No Moisture Temperature / Pain No Abnormalities Noted: No Temperature: No Abnormality Moist: Yes Tenderness on Palpation: Yes Wound Preparation Ulcer Cleansing: Rinsed/Irrigated with Saline Topical Anesthetic Applied: None Treatment Notes Wound #8 (Right, Dorsal Toe Third) 1. Cleansed with: Cleanse wound with antibacterial soap and water 4. Dressing Applied: Aquacel Ag 5. Secondary Dressing Applied Dry Gauze Kerlix/Conform 7. Secured with Secretary/administrator) Signed: 07/18/2016 5:34:49 PM By: Alejandro Mulling Entered By: Alejandro Mulling on 07/18/2016 11:03:46 [name] ([id number]) -------------------------------------------------------------------------------- Vitals Details Patient Name: [name] Date of Service: 07/18/2016 9:45 AM Medical Record Number: [id number] Patient Account Number: [id number] Date of Birth/Sex: [birth year] (70 y.o. Male) Treating RN: Phillis Haggis Primary Care Physician: Oretha Milch Other Clinician: Referring Physician: Oretha Milch Treating Physician/Extender: Rudene Re in Treatment: 28 Vital Signs Time Taken: 10:30 Pulse (bpm): 60 Height (in): 70 Respiratory Rate (breaths/min): 18 Weight (lbs): 235 Blood Pressure (mmHg): 124/73 Body Mass Index (BMI): 33.7 Reference Range: 80 - 120 mg / dl Electronic Signature(s) Signed: 07/18/2016 5:34:49 PM By: Alejandro Mulling Entered By: Alejandro Mulling  on 07/18/2016 10:31:09

## 2016-07-19 NOTE — Progress Notes (Signed)
JABARI, SWOVELAND ([id number]) Visit Report for 07/18/2016 Chief Complaint Document Details Patient Name: [name], [name] Date of Service: 07/18/2016 9:45 AM Medical Record Number: [id number] Patient Account Number: [id number] Date of Birth/Sex: [birth year] (70 y.o. Male) Treating RN: Phillis Haggis Primary Care Physician: Oretha Milch Other Clinician: Referring Physician: Oretha Milch Treating Physician/Extender: Rudene Re in Treatment: 28 Information Obtained from: Patient Chief Complaint Patient is at the clinic for treatment of an open pressure ulcer the left upper thigh and gluteal region and the right heel with bilateral swelling of the legs all of it which is going on for over a year Electronic Signature(s) Signed: 07/18/2016 11:11:37 AM By: Evlyn Kanner MD, FACS Entered By: Evlyn Kanner on 07/18/2016 11:11:36 [name] ([id number]) -------------------------------------------------------------------------------- HPI Details Patient Name: [name]. Date of Service: 07/18/2016 9:45 AM Medical Record Number: [id number] Patient Account Number: [id number] Date of Birth/Sex: 1945/11/12 (70 y.o. Male) Treating RN: Phillis Haggis Primary Care Physician: Oretha Milch Other Clinician: Referring Physician: Oretha Milch Treating Physician/Extender: Rudene Re in Treatment: 28 History of Present Illness Location: ulcerated area on the right heel, left gluteal region and thigh and then bilateral lower extremities Quality: Patient reports experiencing a sharp pain to affected area(s). Severity: Patient states wound are getting worse. Duration: Patient has had the wound for > 12 months prior to seeking treatment at the wound center Timing: Pain in wound is constant (hurts all the time) Context: The wound appeared gradually over time Modifying Factors: Other treatment(s) tried include: he sees his heart doctor and his primary care doctor Associated Signs and  Symptoms: patient has not been able to walk for over a year now HPI Description: 70 year old gentleman with a known history of hypertension, diabetes, obstructive sleep apnea, COPD, diastolic CHF, coronary artery disease was admitted to the hospital with sepsis from an ulcer of the right heel and was treated there in October 2016. he also has chronic bilateral lower extremity edema and lymphedema. He had received vancomycin, Zosyn and at that stage and x-rays showed hardware in the right ankle but no evidence of osteomyelitis. he was a former smoker. he was also treated with Augmentin orally for 10 days. He is either bedbound or wheelchair-bound and does not ambulate by himself. 01/19/2016 -- he has not been seen here for 3 weeks and this was because he was admitted to [facility] between 223 and 01/08/2016 for sepsis, UTI and pneumonia involving the left lung. he was treated with IV vancomycin and Zosyn and then meropenem. Was discharged home on oral Bactrim for 2 weeks none of his vascular test or x-rays were done and we will reorder these. 01/26/2016 -- he has not yet done the x-ray of his foot and is vascular tests are still pending. I have asked him to work on these with his nursing home staff. Addendum: he has got an x-ray of the right foot done which shows that his osteopenia but no specific ostial lysis or abnormal periosteal reaction. Final impression was degenerative changes with dorsal foot soft tissue swelling. 02/16/2016 -- lower extremity arterial duplex examination shows a 50-99% stenosis of the right tibioperoneal trunk. He had biphasic flow in the right SFA, popliteal and tibioperoneal trunk. Left-sided he had triphasic flow throughout 02/29/2016 -- he is awaiting his vascular opinion with Dr. Gilda Crease which is to be done on April 24 03/08/2016 -- he has not kept his appointment with Dr. Gilda Crease on April 24 and does not seem to know what happened  about  this. it's difficult to gauge whether he is in full control of his mental faculties as at times he is extremely rude to the nursing staff. 03/22/2016 -- the patient was seen by Dr. 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 Recommended that the patient had severe atherosclerotic changes of both lower extremities associated with ulceration and tissue loss of the foot. This is a limb threatening ischemia and the patient was recommended to undergo Bures, Kona V. ([id number]) angiography of the lower extremity with a hope for intervention for limb salvage. Patient agreed and will proceed to angiography. He was admitted to the hospital between May 2 and 03/15/2016 - he underwent induction of a catheter into his right lower extremity and third order catheter placement with contrast injection to the right lower extremity for distal runoff. Percutaneous transluminal angioplasty of the right superficial femoral and popliteal arteries were done. He also had a right peroneal angioplasty. Patient had a postoperative hematoma and was admitted for observation and in the next 24 hours he was very agitated and combative and had to be seen by psychiatric. He had a follow-up with Dr. Gilda Crease in 3 weeks. 04/18/2016 -- notes reviewed from the vascular office where he was seen for his postop visit after the PTA of the right SFA and popliteal arteries on 03/12/2016. He underwent an ABI which showed right ABI to be more than 1.3 and left to be more than 1 more than 1.3, great toe and PPG waveforms are decreased bilaterally. No additional intervention indicated at this time and the patient was to follow-up in 3 months with an ABI and bilateral lower extremity duplex study. 05/02/2016 -- he complains that his compression stockings are causing him a lot of discomfort and he is not happy wearing  them. 05/30/2016 -- the swelling of both legs has increased again and he has had blisters which have opened out into ulcerations. Electronic Signature(s) Signed: 07/18/2016 11:11:44 AM By: Evlyn Kanner MD, FACS Entered By: Evlyn Kanner on 07/18/2016 11:11:43 [name] ([id number]) -------------------------------------------------------------------------------- Physical Exam Details Patient Name: [name]. Date of Service: 07/18/2016 9:45 AM Medical Record Number: [id number] Patient Account Number: [id number] Date of Birth/Sex: Jun 21, 1946 (70 y.o. Male) Treating RN: Phillis Haggis Primary Care Physician: Oretha Milch Other Clinician: Referring Physician: Oretha Milch Treating Physician/Extender: Rudene Re in Treatment: 28 Constitutional . Pulse regular. Respirations normal and unlabored. Afebrile. . Eyes Nonicteric. Reactive to light. Ears, Nose, Mouth, and Throat Lips, teeth, and gums WNL.Marland Kitchen Moist mucosa without lesions. Neck supple and nontender. No palpable supraclavicular or cervical adenopathy. Normal sized without goiter. Respiratory WNL. No retractions.. Breath sounds WNL, No rubs, rales, rhonchi, or wheeze.. Cardiovascular Heart rhythm and rate regular, no murmur or gallop.. Pedal Pulses WNL. No clubbing, cyanosis or edema. Lymphatic No adneopathy. No adenopathy. No adenopathy. Musculoskeletal Adexa without tenderness or enlargement.. Digits and nails w/o clubbing, cyanosis, infection, petechiae, ischemia, or inflammatory conditions.. Integumentary (Hair, Skin) No suspicious lesions. No crepitus or fluctuance. No peri-wound warmth or erythema. No masses.Marland Kitchen Psychiatric Judgement and insight Intact.. No evidence of depression, anxiety, or agitation.. Notes the edema is much better than last week but still persists in the left leg is more than the right leg. The wound on his right heel medially is looking very clean and no debridement was required  today. Electronic Signature(s) Signed: 07/18/2016 11:12:15 AM By: Evlyn Kanner MD, FACS Entered  By: Evlyn Kanner on 07/18/2016 11:12:15 [name] ([id number]) -------------------------------------------------------------------------------- Physician Orders Details Patient Name: [name] Date of Service: 07/18/2016 9:45 AM Medical Record Number: [id number] Patient Account Number: [id number] Date of Birth/Sex: [birth year] (70 y.o. Male) Treating RN: [name], RN, [name], [place] Primary Care Physician: Oretha Milch Other Clinician: Referring Physician: Oretha Milch Treating Physician/Extender: Rudene Re in Treatment: 69 Verbal / Phone Orders: Yes Clinician: [name], RN, [name], Rita Read Back and Verified: Yes Diagnosis Coding Wound Cleansing Wound #1 Left Lower Leg o No tub bath. o Other: - Please do sink or bed baths **do not get leg wraps wet** Wound #2 Right Lower Leg o No tub bath. o Other: - Please do sink or bed baths **do not get leg wraps wet** Wound #3 Right Calcaneus o No tub bath. o Other: - Please do sink or bed baths **do not get leg wraps wet** Wound #8 Right,Dorsal Toe Third o No tub bath. o Other: - Please do sink or bed baths **do not get leg wraps wet** Anesthetic Wound #1 Left Lower Leg o Topical Lidocaine 4% cream applied to wound bed prior to debridement - for clinic use only o Topical Lidocaine 4% cream applied to wound bed prior to debridement - for clinic use only Wound #2 Right Lower Leg o Topical Lidocaine 4% cream applied to wound bed prior to debridement - for clinic use only o Topical Lidocaine 4% cream applied to wound bed prior to debridement - for clinic use only Wound #3 Right Calcaneus o Topical Lidocaine 4% cream applied to wound bed prior to debridement - for clinic use only o Topical Lidocaine 4% cream applied to wound bed prior to debridement - for clinic use only Wound #8 Right,Dorsal Toe Third o  Topical Lidocaine 4% cream applied to wound bed prior to debridement - for clinic use only o Topical Lidocaine 4% cream applied to wound bed prior to debridement - for clinic use only Ngo, Jarell V. ([id number]) Skin Barriers/Peri-Wound Care Wound #1 Left Lower Leg o Barrier cream - around the heel wound to reddened and irritated areas Wound #2 Right Lower Leg o Barrier cream - around the heel wound to reddened and irritated areas Wound #3 Right Calcaneus o Barrier cream - around the heel wound to reddened and irritated areas Primary Wound Dressing Wound #1 Left Lower Leg o Aquacel Ag Wound #2 Right Lower Leg o Aquacel Ag Wound #3 Right Calcaneus o Prisma Ag - please moisten with normal saline then place on wound Wound #8 Right,Dorsal Toe Third o Aquacel Ag Secondary Dressing Wound #1 Left Lower Leg o ABD pad Wound #2 Right Lower Leg o ABD pad Wound #3 Right Calcaneus o ABD pad o Dry Gauze o Conform/Kerlix Wound #8 Right,Dorsal Toe Third o Dry Gauze o Conform/Kerlix Dressing Change Frequency Wound #1 Left Lower Leg o Change dressing every week - at the wound clinic Wound #2 Right Lower Leg o Change dressing every week - at the wound clinic Wound #3 Right Calcaneus o Change dressing every other day. - to be changed every other day by SNF RN Buckles, Ismail V. ([id number]) Wound #8 Right,Dorsal Toe Third o Change dressing every other day. Follow-up Appointments Wound #1 Left Lower Leg o Return Appointment in 1 week. Wound #2 Right Lower Leg o Return Appointment in 1 week. Wound #8 Right,Dorsal Toe Third o Return Appointment in 1 week. Wound #3 Right Calcaneus o Return Appointment in 1 week. Edema Control Wound #1 Left Lower Leg   o 3 Layer Compression System - Bilateral o Elevate legs to the level of the heart and pump ankles as often as possible Wound #2 Right Lower Leg o 3 Layer Compression System -  Bilateral o Elevate legs to the level of the heart and pump ankles as often as possible Off-Loading Wound #1 Left Lower Leg o Turn and reposition every 2 hours o Other: - sage boots at night and float heel when lying in bed Wound #2 Right Lower Leg o Turn and reposition every 2 hours o Other: - sage boots at night and float heel when lying in bed Wound #3 Right Calcaneus o Turn and reposition every 2 hours o Other: - sage boots at night and float heel when lying in bed Additional Orders / Instructions Wound #1 Left Lower Leg o Increase protein intake. o Other: - LOW SALT DIET Wound #2 Right Lower Leg Kayes, Hermen V. ([id number]) o Increase protein intake. Wound #3 Right Calcaneus o Increase protein intake. Wound #8 Right,Dorsal Toe Third o Increase protein intake. o Other: - LOW SALT DIET Medications-please add to medication list. Wound #1 Left Lower Leg o Other: - Vitamin C, Vitamin A, Zinc, multivitamin Wound #2 Right Lower Leg o Other: - Vitamin C, Vitamin A, Zinc, multivitamin Wound #3 Right Calcaneus o Other: - Vitamin C, Vitamin A, Zinc, multivitamin Wound #8 Right,Dorsal Toe Third o Other: - Vitamin C, Vitamin A, Zinc, multivitamin Notes PATIENT NEEDS LYMPHEDEMA PUMPS. SNF TO ORDER THESE FOR PATIENT. HE WILL BENEFIT FROM IT. Electronic Signature(s) Signed: 07/18/2016 4:26:59 PM By: Evlyn Kanner MD, FACS Signed: 07/18/2016 5:34:49 PM By: Alejandro Mulling Previous Signature: 07/18/2016 11:00:30 AM Version By: Elpidio Eric [name], RN Entered By: Alejandro Mulling on 07/18/2016 11:30:22 [name] ([id number]) -------------------------------------------------------------------------------- Problem List Details Patient Name: [name]. Date of Service: 07/18/2016 9:45 AM Medical Record Number: [id number] Patient Account Number: [id number] Date of Birth/Sex: [birth year] (70 y.o. Male) Treating RN: Phillis Haggis Primary Care  Physician: Oretha Milch Other Clinician: Referring Physician: Oretha Milch Treating Physician/Extender: Rudene Re in Treatment: 36 Active Problems ICD-10 Encounter Code Description Active Date Diagnosis E11.621 Type 2 diabetes mellitus with foot ulcer 01/01/2016 Yes L89.613 Pressure ulcer of right heel, stage 3 01/01/2016 Yes E66.01 Morbid (severe) obesity due to excess calories 01/01/2016 Yes I89.0 Lymphedema, not elsewhere classified 01/01/2016 Yes M70.871 Other soft tissue disorders related to use, overuse and 01/19/2016 Yes pressure, right ankle and foot 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/16/2016 Yes ulceration of heel and midfoot Inactive Problems Resolved Problems ICD-10 Code Description Active Date Resolved Date L89.322 Pressure ulcer of left buttock, stage 2 01/01/2016 01/01/2016 Electronic Signature(s) Signed: 07/18/2016 11:11:23 AM By: Evlyn Kanner MD, FACS Entered By: Evlyn Kanner on 07/18/2016 11:11:23 [name] ([id number]) [name], Levern VMarland Kitchen ([id number]) -------------------------------------------------------------------------------- Progress Note Details Patient Name: [name]. Date of Service: 07/18/2016 9:45 AM Medical Record Number: [id number] Patient Account Number: [id number] Date of Birth/Sex: Jan 19, 1946 (70 y.o. Male) Treating RN: Phillis Haggis Primary Care Physician: Oretha Milch Other Clinician: Referring Physician: Oretha Milch Treating Physician/Extender: Rudene Re in Treatment: 28 Subjective Chief Complaint Information obtained from Patient Patient is at the clinic for treatment of an open pressure ulcer the left upper thigh and gluteal region and the right heel with bilateral swelling of the legs all of it which is going on for over a year History of Present Illness (HPI) The following HPI elements were documented for the patient's wound: Location: ulcerated area on  the right heel, left gluteal  region and thigh and then bilateral lower extremities Quality: Patient reports experiencing a sharp pain to affected area(s). Severity: Patient states wound are getting worse. Duration: Patient has had the wound for > 12 months prior to seeking treatment at the wound center Timing: Pain in wound is constant (hurts all the time) Context: The wound appeared gradually over time Modifying Factors: Other treatment(s) tried include: he sees his heart doctor and his primary care doctor Associated Signs and Symptoms: patient has not been able to walk for over a year now 70 year old gentleman with a known history of hypertension, diabetes, obstructive sleep apnea, COPD, diastolic CHF, coronary artery disease was admitted to the hospital with sepsis from an ulcer of the right heel and was treated there in October 2016. he also has chronic bilateral lower extremity edema and lymphedema. He had received vancomycin, Zosyn and at that stage and x-rays showed hardware in the right ankle but no evidence of osteomyelitis. he was a former smoker. he was also treated with Augmentin orally for 10 days. He is either bedbound or wheelchair-bound and does not ambulate by himself. 01/19/2016 -- he has not been seen here for 3 weeks and this was because he was admitted to [facility] between 223 and 01/08/2016 for sepsis, UTI and pneumonia involving the left lung. he was treated with IV vancomycin and Zosyn and then meropenem. Was discharged home on oral Bactrim for 2 weeks none of his vascular test or x-rays were done and we will reorder these. 01/26/2016 -- he has not yet done the x-ray of his foot and is vascular tests are still pending. I have asked him to work on these with his nursing home staff. Addendum: he has got an x-ray of the right foot done which shows that his osteopenia but no specific ostial lysis or abnormal periosteal reaction. Final impression was degenerative changes with  dorsal foot soft tissue swelling. 02/16/2016 -- lower extremity arterial duplex examination shows a 50-99% stenosis of the right tibioperoneal trunk. He had biphasic flow in the right SFA, popliteal and tibioperoneal trunk. Left-sided he had triphasic flow throughout MERREL, CRABBE ([id number]) 02/29/2016 -- he is awaiting his vascular opinion with Dr. Gilda Crease which is to be done on April 24 03/08/2016 -- he has not kept his appointment with Dr. Gilda Crease on April 24 and does not seem to know what happened about this. it's difficult to gauge whether he is in full control of his mental faculties as at times he is extremely rude to the nursing staff. 03/22/2016 -- the patient was seen by Dr. 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 Recommended that the patient had severe atherosclerotic changes of both lower extremities associated with ulceration and tissue loss of the foot. This is a limb threatening ischemia and the patient was recommended to undergo angiography of the lower extremity with a hope for intervention for limb salvage. Patient agreed and will proceed to angiography. He was admitted to the hospital between May 2 and 03/15/2016 - he underwent induction of a catheter into his right lower extremity and third order catheter placement with contrast injection to the right lower extremity for distal runoff. Percutaneous transluminal angioplasty of the right superficial femoral and popliteal arteries were done. He also had a right peroneal angioplasty. Patient had a postoperative hematoma and was admitted for observation and in the  next 24 hours he was very agitated and combative and had to be seen by psychiatric. He had a follow-up with Dr. Gilda Crease in 3 weeks. 04/18/2016 -- notes reviewed from the vascular office where he was seen for his postop visit after the PTA of the  right SFA and popliteal arteries on 03/12/2016. He underwent an ABI which showed right ABI to be more than 1.3 and left to be more than 1 more than 1.3, great toe and PPG waveforms are decreased bilaterally. No additional intervention indicated at this time and the patient was to follow-up in 3 months with an ABI and bilateral lower extremity duplex study. 05/02/2016 -- he complains that his compression stockings are causing him a lot of discomfort and he is not happy wearing them. 05/30/2016 -- the swelling of both legs has increased again and he has had blisters which have opened out into ulcerations. Objective Constitutional Pulse regular. Respirations normal and unlabored. Afebrile. Vitals Time Taken: 10:30 AM, Height: 70 in, Weight: 235 lbs, BMI: 33.7, Pulse: 60 bpm, Respiratory Rate: 18 breaths/min, Blood Pressure: 124/73 mmHg. Eyes Nonicteric. Reactive to light. Ears, Nose, Mouth, and Throat Lips, teeth, and gums WNL.Marland Kitchen Moist mucosa without lesions. Neck Finkle, Demitrius V. ([id number]) supple and nontender. No palpable supraclavicular or cervical adenopathy. Normal sized without goiter. Respiratory WNL. No retractions.. Breath sounds WNL, No rubs, rales, rhonchi, or wheeze.. Cardiovascular Heart rhythm and rate regular, no murmur or gallop.. Pedal Pulses WNL. No clubbing, cyanosis or edema. Lymphatic No adneopathy. No adenopathy. No adenopathy. Musculoskeletal Adexa without tenderness or enlargement.. Digits and nails w/o clubbing, cyanosis, infection, petechiae, ischemia, or inflammatory conditions.Marland Kitchen Psychiatric Judgement and insight Intact.. No evidence of depression, anxiety, or agitation.. General Notes: the edema is much better than last week but still persists in the left leg is more than the right leg. The wound on his right heel medially is looking very clean and no debridement was required today. Integumentary (Hair, Skin) No suspicious lesions. No crepitus or  fluctuance. No peri-wound warmth or erythema. No masses.. Wound #1 status is Open. Original cause of wound was Gradually Appeared. The wound is located on the Left Lower Leg. The wound measures 6cm length x 4cm width x 0.1cm depth; 18.85cm^2 area and 1.885cm^3 volume. The wound is limited to skin breakdown. There is no tunneling or undermining noted. There is a large amount of serous drainage noted. The wound margin is flat and intact. There is large (67- 100%) red, pink granulation within the wound bed. There is a small (1-33%) amount of necrotic tissue within the wound bed including Adherent Slough. The periwound skin appearance exhibited: Localized Edema, Moist, Erythema. The surrounding wound skin color is noted with erythema which is circumferential. Periwound temperature was noted as No Abnormality. The periwound has tenderness on palpation. Wound #2 status is Open. Original cause of wound was Gradually Appeared. The wound is located on the Right Lower Leg. The wound measures 3cm length x 3cm width x 0.1cm depth; 7.069cm^2 area and 0.707cm^3 volume. The wound is limited to skin breakdown. There is no tunneling or undermining noted. There is a large amount of serous drainage noted. The wound margin is flat and intact. There is large (67- 100%) red, pink granulation within the wound bed. There is a small (1-33%) amount of necrotic tissue within the wound bed including Adherent Slough. The periwound skin appearance exhibited: Localized Edema, Moist, Erythema. The periwound skin appearance did not exhibit: Maceration. The surrounding wound skin color is noted with erythema  which is circumferential. Periwound temperature was noted as No Abnormality. The periwound has tenderness on palpation. Wound #3 status is Open. Original cause of wound was Pressure Injury. The wound is located on the Right Calcaneus. The wound measures 0.4cm length x 1.5cm width x 0.2cm depth; 0.471cm^2 area and 0.094cm^3  volume. The wound is limited to skin breakdown. There is no tunneling or undermining noted. There is a large amount of serous drainage noted. The wound margin is flat and intact. There is medium (34-66%) red, pink granulation within the wound bed. There is a medium (34-66%) amount of necrotic tissue within the wound bed including Adherent Slough. The periwound skin appearance exhibited: Localized Edema, Maceration, Moist, Erythema. The surrounding wound skin color is noted with erythema Rawles, Sevyn V. ([id number]) which is circumferential. Periwound temperature was noted as No Abnormality. The periwound has tenderness on palpation. Wound #8 status is Open. Original cause of wound was Trauma. The wound is located on the Right,Dorsal Toe Third. The wound measures 0.5cm length x 1.7cm width x 0.1cm depth; 0.668cm^2 area and 0.067cm^3 volume. The wound is limited to skin breakdown. There is no tunneling or undermining noted. There is a large amount of serosanguineous drainage noted. The wound margin is distinct with the outline attached to the wound base. There is large (67-100%) red granulation within the wound bed. There is a small (1-33%) amount of necrotic tissue within the wound bed including Adherent Slough. The periwound skin appearance exhibited: Moist. Periwound temperature was noted as No Abnormality. The periwound has tenderness on palpation. 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 When we heal his wounds and try to use compression stockings he has recurrent problems. In view of this I am going to recommend lymphedema pumps and will recommend this to the nursing home to try and obtain via his insurance company. Plan Wound Cleansing: Wound #1 Left Lower Leg: No tub bath. Other: - Please do sink or bed baths **do not get leg wraps wet** Wound #2 Right Lower Leg: No tub bath. Other: - Please do sink or bed baths **do not get leg wraps wet** Joslin, Callaghan V. ([id number]) Wound #3 Right Calcaneus: No tub bath. Other: - Please do sink or bed baths **do not get leg wraps wet** Wound #8 Right,Dorsal Toe Third: No tub bath. Other: - Please do sink or bed baths **do not get leg wraps wet** Anesthetic: Wound #1 Left Lower Leg: Topical Lidocaine 4% cream applied to wound bed prior to debridement - for clinic use only Topical Lidocaine 4% cream applied to wound bed prior to debridement - for clinic use only Wound #2 Right Lower Leg: Topical Lidocaine 4% cream applied to wound bed prior to debridement - for clinic use only Topical Lidocaine 4% cream applied to wound bed prior to debridement - for clinic use only Wound #3 Right Calcaneus: Topical Lidocaine 4% cream applied to wound bed prior to debridement - for clinic  use only Topical Lidocaine 4% cream applied to wound bed prior to debridement - for clinic use only Wound #8 Right,Dorsal Toe Third: Topical Lidocaine 4% cream applied to wound bed prior to debridement - for clinic use only Topical Lidocaine 4% cream applied to wound bed prior to debridement - for clinic use only Skin Barriers/Peri-Wound Care: Wound #1 Left Lower Leg: Barrier cream - around the heel wound to reddened and irritated areas Wound #2 Right Lower Leg: Barrier cream - around the heel wound to reddened and irritated areas Wound #3 Right  Calcaneus: Barrier cream - around the heel wound to reddened and irritated areas Primary Wound Dressing: Wound #1 Left Lower Leg: Aquacel Ag Wound #2 Right Lower Leg: Aquacel Ag Wound #3 Right Calcaneus: Prisma Ag - please moisten with normal saline then place on wound Wound #8 Right,Dorsal Toe Third: Aquacel Ag Secondary Dressing: Wound #1 Left Lower Leg: ABD pad Wound #2 Right Lower Leg: ABD pad Wound #3 Right Calcaneus: ABD pad Dry Gauze Conform/Kerlix Wound #8 Right,Dorsal Toe Third: Dry Gauze Conform/Kerlix Dressing Change Frequency: Wound #1 Left Lower Leg: Change dressing every week - at the wound clinic Wound #2 Right Lower Leg: Clinger, Abdirizak V. ([id number]) Change dressing every week - at the wound clinic Wound #3 Right Calcaneus: Change dressing every other day. - to be changed every other day by SNF RN Wound #8 Right,Dorsal Toe Third: Change dressing every other day. Follow-up Appointments: Wound #1 Left Lower Leg: Return Appointment in 1 week. Wound #2 Right Lower Leg: Return Appointment in 1 week. Wound #8 Right,Dorsal Toe Third: Return Appointment in 1 week. Wound #3 Right Calcaneus: Return Appointment in 1 week. Edema Control: Wound #1 Left Lower Leg: 3 Layer Compression System - Bilateral Elevate legs to the level of the heart and pump ankles as often as possible Wound #2 Right Lower Leg: 3 Layer Compression System - Bilateral Elevate legs to the level of the heart and pump ankles as often as possible Off-Loading: Wound #1 Left Lower Leg: Turn and reposition every 2 hours Other: - sage boots at night and float heel when lying in bed Wound #2 Right Lower Leg: Turn and reposition every 2 hours Other: - sage boots at night and float heel when lying in bed Wound #3 Right Calcaneus: Turn and reposition every 2 hours Other: - sage boots at night and float heel when lying in bed Additional Orders / Instructions: Wound #1 Left Lower Leg: Increase  protein intake. Other: - LOW SALT DIET Wound #2 Right Lower Leg: Increase protein intake. Wound #3 Right Calcaneus: Increase protein intake. Wound #8 Right,Dorsal Toe Third: Increase protein intake. Other: - LOW SALT DIET Medications-please add to medication list.: Wound #1 Left Lower Leg: Other: - Vitamin C, Vitamin A, Zinc, multivitamin Wound #2 Right Lower Leg: Other: - Vitamin C, Vitamin A, Zinc, multivitamin Wound #3 Right Calcaneus: Other: - Vitamin C, Vitamin A, Zinc, multivitamin Wound #8 Right,Dorsal Toe Third: Other: - Vitamin C, Vitamin A, Zinc, multivitamin Salamone, Cay[place] V. ([id number]) General Notes: PATIENT NEEDS LYMPHEDEMA PUMPS. SNF TO ORDER THESE FOR PATIENT. HE WILL BENEFIT FROM IT. I have recommended: 1. Prisma AG to his right heel. 2. Constant off loading and also using Sage boot. 3. we will use Silver alginate and 3 layer Profore compressions for both lower extremities. 4. him to be very careful about his salt intake and have recommended a low-salt diet 5. High-protein diet and multivitamins including vitamin C and zinc. 6. benefit from lymphedema  pumps to be used bilaterally and I have made my recommendations as above in my assessment. Electronic Signature(s) Signed: 07/18/2016 4:28:12 PM By: Evlyn Kanner MD, FACS Previous Signature: 07/18/2016 11:14:14 AM Version By: Evlyn Kanner MD, FACS Entered By: Evlyn Kanner on 07/18/2016 16:28:12 [name] ([id number]) -------------------------------------------------------------------------------- SuperBill Details Patient Name: [name]. Date of Service: 07/18/2016 Medical Record Number: [id number] Patient Account Number: [id number] Date of Birth/Sex: 1946-05-19 (70 y.o. 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

## 2016-07-25 ENCOUNTER — Encounter: Payer: Medicare Other | Admitting: Surgery

## 2016-07-25 DIAGNOSIS — E11621 Type 2 diabetes mellitus with foot ulcer: Secondary | ICD-10-CM | POA: Diagnosis not present

## 2016-07-27 NOTE — Progress Notes (Signed)
JVON, MERONEY ([id number]) Visit Report for 07/25/2016 Arrival Information Details Patient Name: [name], [name] Date of Service: 07/25/2016 12:45 PM Medical Record Number: [id number] Patient Account Number: [id number] Date of Birth/Sex: 20-May-1946 (70 y.o. Male) Treating RN: Phillis Haggis Primary Care Physician: Oretha Milch Other Clinician: Referring Physician: Oretha Milch Treating Physician/Extender: Rudene Re in Treatment: 68 Visit Information History Since Last Visit All ordered tests and consults were completed: No Patient Arrived: Wheel Chair Added or deleted any medications: No Arrival Time: 12:44 Any new allergies or adverse reactions: No Accompanied By: self Had a fall or experienced change in No Transfer Assistance: EasyPivot activities of daily living that may affect Patient Lift risk of falls: Patient Identification Verified: Yes Signs or symptoms of abuse/neglect since last No Secondary Verification Process Yes visito Completed: Hospitalized since last visit: No Patient Requires Transmission- No Pain Present Now: No Based Precautions: Patient Has Alerts: Yes Patient Alerts: DM II Electronic Signature(s) Signed: 07/26/2016 4:43:58 PM By: Alejandro Mulling Entered By: Alejandro Mulling on 07/25/2016 12:44:47 Pyatt, Macario Seth Bake ([id number]) -------------------------------------------------------------------------------- Encounter Discharge Information Details Patient Name: [name] Date of Service: 07/25/2016 12:45 PM Medical Record Number: [id number] Patient Account Number: [id number] Date of Birth/Sex: 06/22/1946 (70 y.o. Male) Treating RN: Phillis Haggis Primary Care Physician: Oretha Milch Other Clinician: Referring Physician: Oretha Milch Treating Physician/Extender: Rudene Re in Treatment: 17 Encounter Discharge Information Items Discharge Pain Level: 0 Discharge Condition: Stable Ambulatory Status: Wheelchair Discharge  Destination: Nursing Home Transportation: Other Accompanied By: self Schedule Follow-up Appointment: Yes Medication Reconciliation completed and provided to Patient/Care Yes Arseniy Toomey: Provided on Clinical Summary of Care: 07/25/2016 Form Type Recipient Paper Patient EL Electronic Signature(s) Signed: 07/25/2016 1:45:44 PM By: Gwenlyn Perking Entered By: Gwenlyn Perking on 07/25/2016 13:45:44 Mcelwain, Willett V. ([id number]) -------------------------------------------------------------------------------- Lower Extremity Assessment Details Patient Name: [name] Date of Service: 07/25/2016 12:45 PM Medical Record Number: [id number] Patient Account Number: [id number] Date of Birth/Sex: 24-Jun-1946 (70 y.o. Male) Treating RN: Phillis Haggis Primary Care Physician: Oretha Milch Other Clinician: Referring Physician: Oretha Milch Treating Physician/Extender: Rudene Re in Treatment: 29 Edema Assessment Assessed: [Left: No] [Right: No] E[Left: dema] [Right: :] Calf Left: Right: Point of Measurement: 34 cm From Medial Instep 38.1 cm 34.6 cm Ankle Left: Right: Point of Measurement: 12 cm From Medial Instep 26 cm 23.3 cm Vascular Assessment Pulses: Posterior Tibial Dorsalis Pedis Palpable: [Left:Yes] [Right:Yes] Extremity colors, hair growth, and conditions: Extremity Color: [Left:Hyperpigmented] [Right:Hyperpigmented] Temperature of Extremity: [Left:Warm] [Right:Warm] Capillary Refill: [Left:< 3 seconds] [Right:< 3 seconds] Toe Nail Assessment Left: Right: Thick: Yes Yes Discolored: Yes Yes Deformed: No No Improper Length and Hygiene: No No Electronic Signature(s) Signed: 07/26/2016 4:43:58 PM By: Alejandro Mulling Entered By: Alejandro Mulling on 07/25/2016 12:48:23 Blasko, Fitzgerald Seth Bake ([id number]) -------------------------------------------------------------------------------- Multi Wound Chart Details Patient Name: [name] Date of Service: 07/25/2016 12:45  PM Medical Record Number: [id number] Patient Account Number: [id number] Date of Birth/Sex: [birth year] (70 y.o. Male) Treating RN: [name], RN, [name], [place] Primary Care Physician: Oretha Milch Other Clinician: Referring Physician: Oretha Milch Treating Physician/Extender: Rudene Re in Treatment: 29 Vital Signs Height(in): 70 Pulse(bpm): 60 Weight(lbs): 235 Blood Pressure 118/70 (mmHg): Body Mass Index(BMI): 34 Temperature(F): 97.8 Respiratory Rate 18 (breaths/min): Photos: [1:No Photos] [2:No Photos] [3:No Photos] Wound Location: [1:Left Lower Leg] [2:Right Lower Leg] [3:Right Calcaneus] Wounding Event: [1:Gradually Appeared] [2:Gradually Appeared] [3:Pressure Injury] Primary Etiology: [1:Venous Leg Ulcer] [2:Venous Leg Ulcer] [3:Pressure Ulcer] Secondary Etiology: [1:N/A] [2:N/A] [3:N/A] Comorbid History: [1:Cataracts, Chronic Obstructive Pulmonary Disease (COPD),  Sleep Disease (COPD), Sleep Disease (COPD), Sleep Apnea, Angina, Congestive Heart Failure, Congestive Heart Failure, Congestive Heart Failure, Hypertension, Type II Diabetes,  Gout, Osteoarthritis, Neuropathy Osteoarthritis, Neuropathy Osteoarthritis, Neuropathy] [2:Cataracts, Chronic Obstructive Pulmonary Apnea, Angina, Hypertension, Type II Diabetes, Gout,] [3:Cataracts, Chronic Obstructive Pulmonary Apnea, Angina,  Hypertension, Type II Diabetes, Gout,] Date Acquired: [1:12/31/2014] [2:12/31/2014] [3:10/31/2015] Weeks of Treatment: [1:29] [2:29] [3:29] Wound Status: [1:Open] [2:Open] [3:Open] Measurements L x W x D 0.1x0.1x0.1 [2:0.1x0.1x0.1] [3:0.4x1.5x0.2] (cm) Area (cm) : [1:0.008] [2:0.008] [3:0.471] Volume (cm) : [1:0.001] [2:0.001] [3:0.094] % Reduction in Area: [1:100.00%] [2:96.60%] [3:93.30%] % Reduction in Volume: 100.00% [2:95.80%] [3:93.40%] Classification: [1:Partial Thickness] [2:Partial Thickness] [3:Category/Stage III] HBO Classification: [1:Grade 1] [2:Grade 1] [3:Grade 1] Exudate Amount:  [1:Large] [2:Large] [3:Large] Exudate Type: [1:Serous] [2:Serous] [3:Serosanguineous] Exudate Color: [1:amber] [2:amber] [3:red, brown] Wound Margin: [1:Flat and Intact] [2:Flat and Intact] [3:Flat and Intact] Granulation Amount: [1:Large (67-100%)] [2:Large (67-100%)] [3:Medium (34-66%)] Granulation Quality: [1:Red, Pink] [2:Red, Pink] [3:Red, Pink] Necrotic Amount: Small (1-33%) Small (1-33%) Medium (34-66%) Exposed Structures: Fascia: No Fascia: No Fascia: No Fat: No Fat: No Fat: No Tendon: No Tendon: No Tendon: No Muscle: No Muscle: No Muscle: No Joint: No Joint: No Joint: No Bone: No Bone: No Bone: No Limited to Skin Limited to Skin Limited to Skin Breakdown Breakdown Breakdown Epithelialization: Medium (34-66%) Medium (34-66%) Medium (34-66%) Periwound Skin Texture: Edema: Yes Edema: Yes Edema: Yes Periwound Skin Moist: Yes Moist: Yes Maceration: Yes Moisture: Maceration: No Moist: Yes Periwound Skin Color: Erythema: Yes Erythema: Yes Erythema: Yes Erythema Location: Circumferential Circumferential Circumferential Temperature: No Abnormality No Abnormality No Abnormality Tenderness on Yes Yes Yes Palpation: Wound Preparation: Ulcer Cleansing: Other: Ulcer Cleansing: Other: Ulcer Cleansing: soap and water soap and water Rinsed/Irrigated with Saline Topical Anesthetic Topical Anesthetic Applied: None Applied: None Topical Anesthetic Applied: Other: lidocaine 4% Wound Number: 8 N/A N/A Photos: No Photos N/A N/A Wound Location: Right Toe Third - Dorsal N/A N/A Wounding Event: Trauma N/A N/A Primary Etiology: Diabetic Wound/Ulcer of N/A N/A the Lower Extremity Secondary Etiology: Trauma, Other N/A N/A Comorbid History: Cataracts, Chronic N/A N/A Obstructive Pulmonary Disease (COPD), Sleep Apnea, Angina, Congestive Heart Failure, Hypertension, Type II Diabetes, Gout, Osteoarthritis, Neuropathy Date Acquired: 07/17/2016 N/A N/A Weeks of Treatment: 1 N/A  N/A Wound Status: Open N/A N/A Measurements L x W x D 0.3x0.4x0.1 N/A N/A (cm) Area (cm) : 0.094 N/A N/A Volume (cm) : 0.009 N/A N/A % Reduction in Area: 85.90% N/A N/A % Reduction in Volume: 86.60% N/A N/A Mcelrath, Worley V. ([id number]) Classification: Grade 1 N/A N/A HBO Classification: N/A N/A N/A Exudate Amount: Large N/A N/A Exudate Type: Serosanguineous N/A N/A Exudate Color: red, brown N/A N/A Wound Margin: Distinct, outline attached N/A N/A Granulation Amount: Large (67-100%) N/A N/A Granulation Quality: Red N/A N/A Necrotic Amount: Small (1-33%) N/A N/A Exposed Structures: Fascia: No N/A N/A Fat: No Tendon: No Muscle: No Joint: No Bone: No Limited to Skin Breakdown Epithelialization: Medium (34-66%) N/A N/A Periwound Skin Texture: No Abnormalities Noted N/A N/A Periwound Skin Moist: Yes N/A N/A Moisture: Periwound Skin Color: No Abnormalities Noted N/A N/A Erythema Location: N/A N/A N/A Temperature: No Abnormality N/A N/A Tenderness on Yes N/A N/A Palpation: Wound Preparation: Ulcer Cleansing: N/A N/A Rinsed/Irrigated with Saline Topical Anesthetic Applied: None Treatment Notes Electronic Signature(s) Signed: 07/25/2016 5:04:09 PM By: Elpidio Eric [name], RN Entered By: Elpidio Eric on 07/25/2016 13:14:01 [name] ([id number]) -------------------------------------------------------------------------------- Multi-Disciplinary Care Plan Details Patient Name: [name] Date of Service: 07/25/2016 12:45 PM Medical Record  Number: [id number] Patient Account Number: [id number] Date of Birth/Sex: 02/24/1946 (70 y.o. Male) Treating RN: [name], RN, [name], [place] Primary Care Physician: Oretha Milch Other Clinician: Referring Physician: Oretha Milch Treating Physician/Extender: Rudene Re in Treatment: 43 Active Inactive Abuse / Safety / Falls / Self Care Management Nursing Diagnoses: Potential for falls Goals: Patient will remain injury  free Date Initiated: 03/01/2016 Goal Status: Active Interventions: Assess fall risk on admission and as needed Notes: Nutrition Nursing Diagnoses: Imbalanced nutrition Goals: Patient/caregiver agrees to and verbalizes understanding of need to use nutritional supplements and/or vitamins as prescribed Date Initiated: 03/01/2016 Goal Status: Active Interventions: Assess patient nutrition upon admission and as needed per policy Notes: Orientation to the Wound Care Program Nursing Diagnoses: Knowledge deficit related to the wound healing center program Goals: Patient/caregiver will verbalize understanding of the Wound Healing Center [address] EDISON, NICHOLSON ([id number]) Date Initiated: 03/01/2016 Goal Status: Active Interventions: Provide education on orientation to the wound center Notes: Pain, Acute or Chronic Nursing Diagnoses: Pain, acute or chronic: actual or potential Potential alteration in comfort, pain Goals: Patient will verbalize adequate pain control and receive pain control interventions during procedures as needed Date Initiated: 03/01/2016 Goal Status: Active Patient/caregiver will verbalize adequate pain control between visits Date Initiated: 03/01/2016 Goal Status: Active Interventions: Assess comfort goal upon admission Complete pain assessment as per visit requirements Notes: Pressure Nursing Diagnoses: Knowledge deficit related to causes and risk factors for pressure ulcer development Knowledge deficit related to management of pressures ulcers Goals: Patient/caregiver will verbalize risk factors for pressure ulcer development Date Initiated: 03/01/2016 Goal Status: Active Interventions: Assess offloading mechanisms upon admission and as needed Notes: Wound/Skin Impairment Nursing Diagnoses: ISMAR, YABUT ([id number]) Impaired tissue integrity Goals: Ulcer/skin breakdown will have a volume reduction of 30% by week 4 Date Initiated:  03/01/2016 Goal Status: Active Ulcer/skin breakdown will have a volume reduction of 50% by week 8 Date Initiated: 03/01/2016 Goal Status: Active Ulcer/skin breakdown will have a volume reduction of 80% by week 12 Date Initiated: 03/01/2016 Goal Status: Active Interventions: Assess ulceration(s) every visit Notes: Electronic Signature(s) Signed: 07/25/2016 5:04:09 PM By: Elpidio Eric [name], RN Entered By: Elpidio Eric on 07/25/2016 13:13:47 [name], Case Seth Bake ([id number]) -------------------------------------------------------------------------------- Pain Assessment Details Patient Name: [name] Date of Service: 07/25/2016 12:45 PM Medical Record Number: [id number] Patient Account Number: [id number] Date of Birth/Sex: [birth year] (70 y.o. Male) Treating RN: Phillis Haggis Primary Care Physician: Oretha Milch Other Clinician: Referring Physician: Oretha Milch Treating Physician/Extender: Rudene Re in Treatment: 29 Active Problems Location of Pain Severity and Description of Pain Patient Has Paino No Site Locations With Dressing Change: No Pain Management and Medication Current Pain Management: Electronic Signature(s) Signed: 07/26/2016 4:43:58 PM By: Alejandro Mulling Entered By: Alejandro Mulling on 07/25/2016 12:44:54 [name] ([id number]) -------------------------------------------------------------------------------- Patient/Caregiver Education Details Patient Name: [name] Date of Service: 07/25/2016 12:45 PM Medical Record Number: [id number] Patient Account Number: [id number] Date of Birth/Gender: 14-Jun-1946 (70 y.o. Male) Treating RN: Phillis Haggis Primary Care Physician: Oretha Milch Other Clinician: Referring Physician: Oretha Milch Treating Physician/Extender: Rudene Re in Treatment: 60 Education Assessment Education Provided To: Patient Education Topics Provided Wound/Skin Impairment: Handouts: Other: change dressings as  directed Methods: Demonstration, Explain/Verbal Responses: State content correctly Electronic Signature(s) Signed: 07/26/2016 4:43:58 PM By: Alejandro Mulling Entered By: Alejandro Mulling on 07/25/2016 13:06:50 Zinger, Azaiah Seth Bake ([id number]) -------------------------------------------------------------------------------- Wound Assessment Details Patient Name: [name] Date of Service: 07/25/2016 12:45 PM Medical Record Number: [id number] Patient Account Number: [id number] Date of  Birth/Sex: 12/10/1945 (70 y.o. Male) Treating RN: Ashok Cordia, Debi Primary Care Physician: Oretha Milch Other Clinician: Referring Physician: Oretha Milch Treating Physician/Extender: Rudene Re in Treatment: 29 Wound Status Wound Number: 1 Primary Venous Leg Ulcer Etiology: Wound Location: Left Lower Leg Wound Open Wounding Event: Gradually Appeared Status: Date Acquired: 12/31/2014 Comorbid Cataracts, Chronic Obstructive Weeks Of Treatment: 29 History: Pulmonary Disease (COPD), Sleep Clustered Wound: No Apnea, Angina, Congestive Heart Failure, Hypertension, Type II Diabetes, Gout, Osteoarthritis, Neuropathy Photos Photo Uploaded By: Alejandro Mulling on 07/25/2016 15:48:18 Wound Measurements Length: (cm) 0.1 Width: (cm) 0.1 Depth: (cm) 0.1 Area: (cm) 0.008 Volume: (cm) 0.001 % Reduction in Area: 100% % Reduction in Volume: 100% Epithelialization: Medium (34-66%) Tunneling: No Undermining: No Wound Description Classification: Partial Thickness Foul Odor Af Diabetic Severity (Wagner): Grade 1 Wound Margin: Flat and Intact Exudate Amount: Large Exudate Type: Serous Exudate Color: amber ter Cleansing: No Wound Bed Granulation Amount: Large (67-100%) Exposed Structure Foskett, Andie V. ([id number]) Granulation Quality: Red, Pink Fascia Exposed: No Necrotic Amount: Small (1-33%) Fat Layer Exposed: No Necrotic Quality: Adherent Slough Tendon Exposed: No Muscle Exposed:  No Joint Exposed: No Bone Exposed: No Limited to Skin Breakdown Periwound Skin Texture Texture Color No Abnormalities Noted: No No Abnormalities Noted: No Localized Edema: Yes Erythema: Yes Erythema Location: Circumferential Moisture No Abnormalities Noted: No Temperature / Pain Moist: Yes Temperature: No Abnormality Tenderness on Palpation: Yes Wound Preparation Ulcer Cleansing: Other: soap and water, Topical Anesthetic Applied: None Treatment Notes Wound #1 (Left Lower Leg) 1. Cleansed with: Cleanse wound with antibacterial soap and water 2. Anesthetic Topical Lidocaine 4% cream to wound bed prior to debridement 4. Dressing Applied: Aquacel Ag 5. Secondary Dressing Applied ABD Pad 7. Secured with Tape 3 Layer Compression System - Bilateral Notes unna to anchor Electronic Signature(s) Signed: 07/26/2016 4:43:58 PM By: Alejandro Mulling Entered By: Alejandro Mulling on 07/25/2016 13:00:23 [name] ([id number]) -------------------------------------------------------------------------------- Wound Assessment Details Patient Name: [name] Date of Service: 07/25/2016 12:45 PM Medical Record Number: [id number] Patient Account Number: [id number] Date of Birth/Sex: 09/26/1946 (70 y.o. Male) Treating RN: Phillis Haggis Primary Care Physician: Oretha Milch Other Clinician: Referring Physician: Oretha Milch Treating Physician/Extender: Rudene Re in Treatment: 29 Wound Status Wound Number: 2 Primary Venous Leg Ulcer Etiology: Wound Location: Right Lower Leg Wound Open Wounding Event: Gradually Appeared Status: Date Acquired: 12/31/2014 Comorbid Cataracts, Chronic Obstructive Weeks Of Treatment: 29 History: Pulmonary Disease (COPD), Sleep Clustered Wound: No Apnea, Angina, Congestive Heart Failure, Hypertension, Type II Diabetes, Gout, Osteoarthritis, Neuropathy Photos Photo Uploaded By: Alejandro Mulling on 07/25/2016 15:48:47 Wound  Measurements Length: (cm) 0.1 Width: (cm) 0.1 Depth: (cm) 0.1 Area: (cm) 0.008 Volume: (cm) 0.001 % Reduction in Area: 96.6% % Reduction in Volume: 95.8% Epithelialization: Medium (34-66%) Tunneling: No Undermining: No Wound Description Classification: Partial Thickness Foul Odor Af Diabetic Severity [address]): Grade 1 Wound Margin: Flat and Intact Exudate Amount: Large Exudate Type: Serous Exudate Color: amber ter Cleansing: No Wound Bed Granulation Amount: Large (67-100%) Exposed Structure Cardenas, Berish V. ([id number]) Granulation Quality: Red, Pink Fascia Exposed: No Necrotic Amount: Small (1-33%) Fat Layer Exposed: No Necrotic Quality: Adherent Slough Tendon Exposed: No Muscle Exposed: No Joint Exposed: No Bone Exposed: No Limited to Skin Breakdown Periwound Skin Texture Texture Color No Abnormalities Noted: No No Abnormalities Noted: No Localized Edema: Yes Erythema: Yes Erythema Location: Circumferential Moisture No Abnormalities Noted: No Temperature / Pain Maceration: No Temperature: No Abnormality Moist: Yes Tenderness on Palpation: Yes Wound Preparation Ulcer Cleansing: Other: soap and  water, Topical Anesthetic Applied: None Treatment Notes Wound #2 (Right Lower Leg) 1. Cleansed with: Cleanse wound with antibacterial soap and water 2. Anesthetic Topical Lidocaine 4% cream to wound bed prior to debridement 4. Dressing Applied: Aquacel Ag 5. Secondary Dressing Applied ABD Pad 7. Secured with Tape 3 Layer Compression System - Bilateral Notes unna to anchor Electronic Signature(s) Signed: 07/26/2016 4:43:58 PM By: Alejandro Mulling Entered By: Alejandro Mulling on 07/25/2016 13:00:45 [name], Jaqua VMarland Kitchen ([id number]) -------------------------------------------------------------------------------- Wound Assessment Details Patient Name: [name] Date of Service: 07/25/2016 12:45 PM Medical Record Number: [id number] Patient Account  Number: [id number] Date of Birth/Sex: Jan 12, 1946 (70 y.o. Male) Treating RN: Phillis Haggis Primary Care Physician: Oretha Milch Other Clinician: Referring Physician: Oretha Milch Treating Physician/Extender: Rudene Re in Treatment: 29 Wound Status Wound Number: 3 Primary Pressure Ulcer Etiology: Wound Location: Right Calcaneus Wound Open Wounding Event: Pressure Injury Status: Date Acquired: 10/31/2015 Comorbid Cataracts, Chronic Obstructive Weeks Of Treatment: 29 History: Pulmonary Disease (COPD), Sleep Clustered Wound: No Apnea, Angina, Congestive Heart Failure, Hypertension, Type II Diabetes, Gout, Osteoarthritis, Neuropathy Photos Photo Uploaded By: Alejandro Mulling on 07/25/2016 15:49:09 Wound Measurements Length: (cm) 0.4 Width: (cm) 1.5 Depth: (cm) 0.2 Area: (cm) 0.471 Volume: (cm) 0.094 % Reduction in Area: 93.3% % Reduction in Volume: 93.4% Epithelialization: Medium (34-66%) Tunneling: No Undermining: No Wound Description Classification: Category/Stage III Foul Odor Diabetic Severity (Wagner): Grade 1 Wound Margin: Flat and Intact Exudate Amount: Large Exudate Type: Serosanguineous Exudate Color: red, brown After Cleansing: No Wound Bed Granulation Amount: Medium (34-66%) Exposed Structure Burnett, Bryten V. ([id number]) Granulation Quality: Red, Pink Fascia Exposed: No Necrotic Amount: Medium (34-66%) Fat Layer Exposed: No Necrotic Quality: Adherent Slough Tendon Exposed: No Muscle Exposed: No Joint Exposed: No Bone Exposed: No Limited to Skin Breakdown Periwound Skin Texture Texture Color No Abnormalities Noted: No No Abnormalities Noted: No Localized Edema: Yes Erythema: Yes Erythema Location: Circumferential Moisture No Abnormalities Noted: No Temperature / Pain Maceration: Yes Temperature: No Abnormality Moist: Yes Tenderness on Palpation: Yes Wound Preparation Ulcer Cleansing: Rinsed/Irrigated with Saline Topical  Anesthetic Applied: Other: lidocaine 4%, Treatment Notes Wound #3 (Right Calcaneus) 1. Cleansed with: Cleanse wound with antibacterial soap and water 2. Anesthetic Topical Lidocaine 4% cream to wound bed prior to debridement 4. Dressing Applied: Aquacel Ag 5. Secondary Dressing Applied ABD Pad Dry Gauze Kerlix/Conform 7. Secured with Secretary/administrator) Signed: 07/26/2016 4:43:58 PM By: Alejandro Mulling Entered By: Alejandro Mulling on 07/25/2016 13:02:31 [name] ([id number]) -------------------------------------------------------------------------------- Wound Assessment Details Patient Name: [name] Date of Service: 07/25/2016 12:45 PM Medical Record Number: [id number] Patient Account Number: [id number] Date of Birth/Sex: [birth year] (70 y.o. Male) Treating RN: Phillis Haggis Primary Care Physician: Oretha Milch Other Clinician: Referring Physician: Oretha Milch Treating Physician/Extender: Rudene Re in Treatment: 29 Wound Status Wound Number: 8 Primary Diabetic Wound/Ulcer of the Lower Etiology: Extremity Wound Location: Right Toe Third - Dorsal Secondary Trauma, Other Wounding Event: Trauma Etiology: Date Acquired: 07/17/2016 Wound Open Weeks Of Treatment: 1 Status: Clustered Wound: No Comorbid Cataracts, Chronic Obstructive History: Pulmonary Disease (COPD), Sleep Apnea, Angina, Congestive Heart Failure, Hypertension, Type II Diabetes, Gout, Osteoarthritis, Neuropathy Photos Photo Uploaded By: Alejandro Mulling on 07/25/2016 15:49:39 Wound Measurements Length: (cm) 0.3 Width: (cm) 0.4 Depth: (cm) 0.1 Area: (cm) 0.094 Volume: (cm) 0.009 % Reduction in Area: 85.9% % Reduction in Volume: 86.6% Epithelialization: Medium (34-66%) Tunneling: No Undermining: No Wound Description Classification: Grade 1 Wound Margin: Distinct, outline attached Exudate Amount: Large Exudate Type: Serosanguineous Exudate Color: red,  brown Foul Odor After Cleansing: No Wound Bed Sterkel, Eliazar V. ([id number]) Granulation Amount: Large (67-100%) Exposed Structure Granulation Quality: Red Fascia Exposed: No Necrotic Amount: Small (1-33%) Fat Layer Exposed: No Necrotic Quality: Adherent Slough Tendon Exposed: No Muscle Exposed: No Joint Exposed: No Bone Exposed: No Limited to Skin Breakdown Periwound Skin Texture Texture Color No Abnormalities Noted: No No Abnormalities Noted: No Moisture Temperature / Pain No Abnormalities Noted: No Temperature: No Abnormality Moist: Yes Tenderness on Palpation: Yes Wound Preparation Ulcer Cleansing: Rinsed/Irrigated with Saline Topical Anesthetic Applied: None Treatment Notes Wound #3 (Right Calcaneus) 1. Cleansed with: Cleanse wound with antibacterial soap and water 4. Dressing Applied: Aquacel Ag 5. Secondary Dressing Applied Dry Gauze Kerlix/Conform 7. Secured with Secretary/administratorTape Electronic Signature(s) Signed: 07/26/2016 4:43:58 PM By: Alejandro MullingPinkerton, Debra Entered By: Alejandro MullingPinkerton, Debra on 07/25/2016 13:01:44 [name] OttoLINDLEY, Kristoff V. ([id number]) -------------------------------------------------------------------------------- Vitals Details Patient Name: [name] OttoLINDLEY, Delson V. Date of Service: 07/25/2016 12:45 PM Medical Record Number: [id number] Patient Account Number: [id number] Date of Birth/Sex: 24-Apr-1946 101(70 y.o. Male) Treating RN: Phillis HaggisPinkerton, Debi Primary Care Physician: Oretha MilchSMITH, [name] Other Clinician: Referring Physician: Oretha MilchSMITH, [name] Treating Physician/Extender: Rudene ReBritto, Errol Weeks in Treatment: 29 Vital Signs Time Taken: 12:44 Temperature (F): 97.8 Height (in): 70 Pulse (bpm): 60 Weight (lbs): 235 Respiratory Rate (breaths/min): 18 Body Mass Index (BMI): 33.7 Blood Pressure (mmHg): 118/70 Reference Range: 80 - 120 mg / dl Electronic Signature(s) Signed: 07/26/2016 4:43:58 PM By: Alejandro MullingPinkerton, Debra Entered By: Alejandro MullingPinkerton, Debra on 07/25/2016 12:45:29

## 2016-07-27 NOTE — Progress Notes (Signed)
[name], Khyle V. ([id number]) Visit Report for 07/25/2016 Chief Complaint Document Details Patient Name: [name], Ace V. Date of Service: 07/25/2016 12:45 PM Medical Record Number: [id number] Patient Account Number: [id number] Date of Birth/Sex: 01-01-1946 39(70 y.o. Male) Treating RN: Phillis HaggisPinkerton, Debi Primary Care Physician: Oretha MilchSMITH, [name] Other Clinician: Referring Physician: Oretha MilchSMITH, [name] Treating Physician/Extender: Rudene ReBritto, Devynne Sturdivant Weeks in Treatment: 29 Information Obtained from: Patient Chief Complaint Patient is at the clinic for treatment of an open pressure ulcer the left upper thigh and gluteal region and the right heel with bilateral swelling of the legs all of it which is going on for over a year Electronic Signature(s) Signed: 07/25/2016 1:21:10 PM By: Evlyn KannerBritto, Tyquasia Pant MD, FACS Entered By: Evlyn KannerBritto, Kasumi Ditullio on 07/25/2016 13:21:09 [name], Majid V. ([id number]) -------------------------------------------------------------------------------- Debridement Details Patient Name: [name], Jahn V. Date of Service: 07/25/2016 12:45 PM Medical Record Number: [id number] Patient Account Number: [id number] Date of Birth/Sex: 01-01-1946 [age] y.o. Male) Treating RN: Phillis HaggisPinkerton, Debi Primary Care Physician: Oretha MilchSMITH, [name] Other Clinician: Referring Physician: Oretha MilchSMITH, [name] Treating Physician/Extender: Rudene ReBritto, Donnice Nielsen Weeks in Treatment: 29 Debridement Performed for Wound #3 Right Calcaneus Assessment: Performed By: Physician Evlyn KannerBritto, Neev Mcmains, MD Debridement: Debridement Pre-procedure Yes - 13:16 Verification/Time Out Taken: Start Time: 13:16 Pain Control: Lidocaine 4% Topical Solution Level: Skin/Subcutaneous Tissue Total Area Debrided (L x 0.4 (cm) x 1.5 (cm) = 0.6 (cm) W): Tissue and other Callus, Exudate, Fibrin/Slough, Skin, Subcutaneous material debrided: Instrument: Curette Bleeding: Minimum Hemostasis Achieved: Pressure End Time: 13:21 Procedural Pain: 0 Post Procedural Pain: 0 Response to  Treatment: Procedure was tolerated well Post Debridement Measurements of Total Wound Length: (cm) 0.4 Stage: Category/Stage III Width: (cm) 1.5 Depth: (cm) 0.2 Volume: (cm) 0.094 Character of Wound/Ulcer Post Requires Further Debridement: Debridement Severity of Tissue Post Fat layer exposed Debridement: Post Procedure Diagnosis Same as Pre-procedure Electronic Signature(s) Signed: 07/25/2016 1:20:58 PM By: Evlyn KannerBritto, Sheketa Ende MD, FACS Signed: 07/26/2016 4:43:58 PM By: Alejandro MullingPinkerton, Debra Entered By: Evlyn KannerBritto, Isaak Delmundo on 07/25/2016 13:20:58 [name], Denali V. ([id number]) Clint GuyLINDLEY, Larz V. ([id number]) -------------------------------------------------------------------------------- HPI Details Patient Name: [name], Danzig V. Date of Service: 07/25/2016 12:45 PM Medical Record Number: [id number] Patient Account Number: [id number] Date of Birth/Sex: 01-01-1946 [age] y.o. Male) Treating RN: Phillis HaggisPinkerton, Debi Primary Care Physician: Oretha MilchSMITH, [name] Other Clinician: Referring Physician: Oretha MilchSMITH, [name] Treating Physician/Extender: Rudene ReBritto, Velicia Dejager Weeks in Treatment: 29 History of Present Illness Location: ulcerated area on the right heel, left gluteal region and thigh and then bilateral lower extremities Quality: Patient reports experiencing a sharp pain to affected area(s). Severity: Patient states wound are getting worse. Duration: Patient has had the wound for > 12 months prior to seeking treatment at the wound center Timing: Pain in wound is constant (hurts all the time) Context: The wound appeared gradually over time Modifying Factors: Other treatment(s) tried include: he sees his heart doctor and his primary care doctor Associated Signs and Symptoms: patient has not been able to walk for over a year now HPI Description: 70 year old gentleman with a known history of hypertension, diabetes, obstructive sleep apnea, COPD, diastolic CHF, coronary artery disease was admitted to the hospital with sepsis from an  ulcer of the right heel and was treated there in October 2016. he also has chronic bilateral lower extremity edema and lymphedema. He had received vancomycin, Zosyn and at that stage and x-rays showed hardware in the right ankle but no evidence of osteomyelitis. he was a former smoker. he was also treated with Augmentin orally for 10 days. He is either bedbound or wheelchair-bound and does not ambulate by himself. 01/19/2016 --  he has not been seen here for 3 weeks and this was because he was admitted to [facility] between 223 and 01/08/2016 for sepsis, UTI and pneumonia involving the left lung. he was treated with IV vancomycin and Zosyn and then meropenem. Was discharged home on oral Bactrim for 2 weeks none of his vascular test or x-rays were done and we will reorder these. 01/26/2016 -- he has not yet done the x-ray of his foot and is vascular tests are still pending. I have asked him to work on these with his nursing home staff. Addendum: he has got an x-ray of the right foot done which shows that his osteopenia but no specific ostial lysis or abnormal periosteal reaction. Final impression was degenerative changes with dorsal foot soft tissue swelling. 02/16/2016 -- lower extremity arterial duplex examination shows a 50-99% stenosis of the right tibioperoneal trunk. He had biphasic flow in the right SFA, popliteal and tibioperoneal trunk. Left-sided he had triphasic flow throughout 02/29/2016 -- he is awaiting his vascular opinion with Dr. Gilda Crease which is to be done on April 24 03/08/2016 -- he has not kept his appointment with Dr. Gilda Crease on April 24 and does not seem to know what happened about this. it's difficult to gauge whether he is in full control of his mental faculties as at times he is extremely rude to the nursing staff. 03/22/2016 -- the patient was seen by Dr. 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 Recommended that the patient had severe atherosclerotic changes of both lower extremities associated with ulceration and tissue loss of the foot. This is a limb threatening ischemia and the patient was recommended to undergo Borman, Kamani V. ([id number]) angiography of the lower extremity with a hope for intervention for limb salvage. Patient agreed and will proceed to angiography. He was admitted to the hospital between May 2 and 03/15/2016 - he underwent induction of a catheter into his right lower extremity and third order catheter placement with contrast injection to the right lower extremity for distal runoff. Percutaneous transluminal angioplasty of the right superficial femoral and popliteal arteries were done. He also had a right peroneal angioplasty. Patient had a postoperative hematoma and was admitted for observation and in the next 24 hours he was very agitated and combative and had to be seen by psychiatric. He had a follow-up with Dr. Gilda Crease in 3 weeks. 04/18/2016 -- notes reviewed from the vascular office where he was seen for his postop visit after the PTA of the right SFA and popliteal arteries on 03/12/2016. He underwent an ABI which showed right ABI to be more than 1.3 and left to be more than 1 more than 1.3, great toe and PPG waveforms are decreased bilaterally. No additional intervention indicated at this time and the patient was to follow-up in 3 months with an ABI and bilateral lower extremity duplex study. 05/02/2016 -- he complains that his compression stockings are causing him a lot of discomfort and he is not happy wearing them. 05/30/2016 -- the swelling of both legs has increased again and he has had blisters which have opened out into ulcerations. Electronic Signature(s) Signed: 07/25/2016 1:21:20 PM By: Evlyn Kanner MD, FACS Entered By: Evlyn Kanner on 07/25/2016 13:21:20 [name] Otto  ([id number]) -------------------------------------------------------------------------------- Physical Exam Details Patient Name: [name] Otto. Date of Service: 07/25/2016 12:45 PM Medical Record Number: [id number] Patient Account Number:  [id number] Date of Birth/Sex: [birth year] (70 y.o. Male) Treating RN: Ashok Cordia, Debi Primary Care Physician: Oretha Milch Other Clinician: Referring Physician: Oretha Milch Treating Physician/Extender: Rudene Re in Treatment: 29 Constitutional . Pulse regular. Respirations normal and unlabored. Afebrile. . Eyes Nonicteric. Reactive to light. Ears, Nose, Mouth, and Throat Lips, teeth, and gums WNL.Marland Kitchen Moist mucosa without lesions. Neck supple and nontender. No palpable supraclavicular or cervical adenopathy. Normal sized without goiter. Respiratory WNL. No retractions.. Breath sounds WNL, No rubs, rales, rhonchi, or wheeze.. Cardiovascular Heart rhythm and rate regular, no murmur or gallop.. Pedal Pulses WNL. No clubbing, cyanosis or edema. Chest Breasts symmetical and no nipple discharge.. Breast tissue WNL, no masses, lumps, or tenderness.. Lymphatic No adneopathy. No adenopathy. No adenopathy. Musculoskeletal Adexa without tenderness or enlargement.. Digits and nails w/o clubbing, cyanosis, infection, petechiae, ischemia, or inflammatory conditions.. Integumentary (Hair, Skin) No suspicious lesions. No crepitus or fluctuance. No peri-wound warmth or erythema. No masses.Marland Kitchen Psychiatric Judgement and insight Intact.. No evidence of depression, anxiety, or agitation.. Notes on the right calcaneum has more maceration and I have sharply debrided this and the base of the wound and there is some area of unhealthy granulation tissue. Electronic Signature(s) Signed: 07/25/2016 1:22:16 PM By: Evlyn Kanner MD, FACS Entered By: Evlyn Kanner on 07/25/2016 13:22:16 [name] Otto  ([id number]) -------------------------------------------------------------------------------- Physician Orders Details Patient Name: [name] Otto Date of Service: 07/25/2016 12:45 PM Medical Record Number: [id number] Patient Account Number: [id number] Date of Birth/Sex: 1946/04/18 (70 y.o. Male) Treating RN: [name], RN, [name], [place]ink Primary Care Physician: Oretha Milch Other Clinician: Referring Physician: Oretha Milch Treating Physician/Extender: Rudene Re in Treatment: 62 Verbal / Phone Orders: Yes Clinician: [name], RN, [name], Rita Read Back and Verified: Yes Diagnosis Coding Wound Cleansing Wound #1 Left Lower Leg o No tub bath. o Other: - Please do sink or bed baths **do not get leg wraps wet** Wound #2 Right Lower Leg o No tub bath. o Other: - Please do sink or bed baths **do not get leg wraps wet** Wound #3 Right Calcaneus o No tub bath. o Other: - Please do sink or bed baths **do not get leg wraps wet** Wound #8 Right,Dorsal Toe Third o No tub bath. o Other: - Please dont sink or bed baths **do not get leg wraps wet** Anesthetic Wound #1 Left Lower Leg o Topical Lidocaine 4% cream applied to wound bed prior to debridement - for clinic use only o Topical Lidocaine 4% cream applied to wound bed prior to debridement - for clinic use only Wound #2 Right Lower Leg o Topical Lidocaine 4% cream applied to wound bed prior to debridement - for clinic use only o Topical Lidocaine 4% cream applied to wound bed prior to debridement - for clinic use only Wound #3 Right Calcaneus o Topical Lidocaine 4% cream applied to wound bed prior to debridement - for clinic use only o Topical Lidocaine 4% cream applied to wound bed prior to debridement - for clinic use only Wound #8 Right,Dorsal Toe Third o Topical Lidocaine 4% cream applied to wound bed prior to debridement - for clinic use only o Topical Lidocaine 4% cream applied to wound bed prior to  debridement - for clinic use only Antonopoulos, Ronen V. ([id number]) Skin Barriers/Peri-Wound Care Wound #1 Left Lower Leg o Barrier cream - around the heel wound to reddened and irritated areas Wound #2 Right Lower Leg o Barrier cream - around the heel wound to reddened and irritated areas Wound #3 Right Calcaneus o  Barrier cream - around the heel wound to reddened and irritated areas Primary Wound Dressing Wound #1 Left Lower Leg o Aquacel Ag Wound #2 Right Lower Leg o Aquacel Ag Wound #3 Right Calcaneus o Aquacel Ag Wound #8 Right,Dorsal Toe Third o Aquacel Ag Secondary Dressing Wound #1 Left Lower Leg o ABD pad Wound #2 Right Lower Leg o ABD pad Wound #3 Right Calcaneus o ABD pad o Dry Gauze o Conform/Kerlix Wound #8 Right,Dorsal Toe Third o Dry Gauze o Conform/Kerlix Dressing Change Frequency Wound #1 Left Lower Leg o Change dressing every week - at the wound clinic Wound #2 Right Lower Leg o Change dressing every week - at the wound clinic Wound #3 Right Calcaneus o Change dressing every day. - SN[facility] RN to change Lausch, Kiyon V. ([id number]) Wound #8 Right,Dorsal Toe Third o Change dressing every other day. Follow-up Appointments Wound #1 Left Lower Leg o Return Appointment in 1 week. Wound #2 Right Lower Leg o Return Appointment in 1 week. Wound #3 Right Calcaneus o Return Appointment in 1 week. Wound #8 Right,Dorsal Toe Third o Return Appointment in 1 week. Edema Control Wound #1 Left Lower Leg o 3 Layer Compression System - Bilateral o Elevate legs to the level of the heart and pump ankles as often as possible Wound #2 Right Lower Leg o 3 Layer Compression System - Bilateral o Elevate legs to the level of the heart and pump ankles as often as possible Off-Loading Wound #1 Left Lower Leg o Turn and reposition every 2 hours o Other: - sage boots at night and float heel when lying in bed Wound #2  Right Lower Leg o Turn and reposition every 2 hours o Other: - sage boots at night and float heel when lying in bed Wound #3 Right Calcaneus o Turn and reposition every 2 hours o Other: - sage boots at night and float heel when lying in bed Additional Orders / Instructions Wound #1 Left Lower Leg o Increase protein intake. o Other: - LOW SALT DIET Wound #2 Right Lower Leg Wrisley, Tasha V. ([id number]) o Increase protein intake. Wound #3 Right Calcaneus o Increase protein intake. Wound #8 Right,Dorsal Toe Third o Increase protein intake. o Other: - LOW SALT DIET Medications-please add to medication list. Wound #1 Left Lower Leg o Other: - Vitamin C, Vitamin A, Zinc, multivitamin Wound #2 Right Lower Leg o Other: - Vitamin C, Vitamin A, Zinc, multivitamin Wound #3 Right Calcaneus o Other: - Vitamin C, Vitamin A, Zinc, multivitamin Wound #8 Right,Dorsal Toe Third o Other: - Vitamin C, Vitamin A, Zinc, multivitamin Electronic Signature(s) Signed: 07/25/2016 3:58:34 PM By: Evlyn Kanner MD, FACS Signed: 07/25/2016 5:04:09 PM By: Elpidio Eric [name], RN Entered By: Elpidio Eric on 07/25/2016 13:20:13 [name] Otto ([id number]) -------------------------------------------------------------------------------- Problem List Details Patient Name: [name] Otto. Date of Service: 07/25/2016 12:45 PM Medical Record Number: [id number] Patient Account Number: [id number] Date of Birth/Sex: 1946-10-12 (70 y.o. Male) Treating RN: Phillis Haggis Primary Care Physician: Oretha Milch Other Clinician: Referring Physician: Oretha Milch Treating Physician/Extender: Rudene Re in Treatment: 29 Active Problems ICD-10 Encounter Code Description Active Date Diagnosis E11.621 Type 2 diabetes mellitus with foot ulcer 01/01/2016 Yes L89.613 Pressure ulcer of right heel, stage 3 01/01/2016 Yes E66.01 Morbid (severe) obesity due to excess calories 01/01/2016 Yes I89.0  Lymphedema, not elsewhere classified 01/01/2016 Yes M70.871 Other soft tissue disorders related to use, overuse and 01/19/2016 Yes pressure, right ankle and foot I70.234 Atherosclerosis of native arteries of right  leg with 02/16/2016 Yes ulceration of heel and midfoot Inactive Problems Resolved Problems ICD-10 Code Description Active Date Resolved Date L89.322 Pressure ulcer of left buttock, stage 2 01/01/2016 01/01/2016 Electronic Signature(s) Signed: 07/25/2016 1:20:41 PM By: Evlyn Kanner MD, FACS Entered By: Evlyn Kanner on 07/25/2016 13:20:41 [name] Otto ([id number]) [name], Torell VMarland Kitchen ([id number]) -------------------------------------------------------------------------------- Progress Note Details Patient Name: [name] Otto. Date of Service: 07/25/2016 12:45 PM Medical Record Number: [id number] Patient Account Number: [id number] Date of Birth/Sex: [birth year] (70 y.o. Male) Treating RN: Phillis Haggis Primary Care Physician: Oretha Milch Other Clinician: Referring Physician: Oretha Milch Treating Physician/Extender: Rudene Re in Treatment: 3 Subjective Chief Complaint Information obtained from Patient Patient is at the clinic for treatment of an open pressure ulcer the left upper thigh and gluteal region and the right heel with bilateral swelling of the legs all of it which is going on for over a year History of Present Illness (HPI) The following HPI elements were documented for the patient's wound: Location: ulcerated area on the right heel, left gluteal region and thigh and then bilateral lower extremities Quality: Patient reports experiencing a sharp pain to affected area(s). Severity: Patient states wound are getting worse. Duration: Patient has had the wound for > 12 months prior to seeking treatment at the wound center Timing: Pain in wound is constant (hurts all the time) Context: The wound appeared gradually over time Modifying Factors: Other  treatment(s) tried include: he sees his heart doctor and his primary care doctor Associated Signs and Symptoms: patient has not been able to walk for over a year now 70 year old gentleman with a known history of hypertension, diabetes, obstructive sleep apnea, COPD, diastolic CHF, coronary artery disease was admitted to the hospital with sepsis from an ulcer of the right heel and was treated there in October 2016. he also has chronic bilateral lower extremity edema and lymphedema. He had received vancomycin, Zosyn and at that stage and x-rays showed hardware in the right ankle but no evidence of osteomyelitis. he was a former smoker. he was also treated with Augmentin orally for 10 days. He is either bedbound or wheelchair-bound and does not ambulate by himself. 01/19/2016 -- he has not been seen here for 3 weeks and this was because he was admitted to [facility] between 223 and 01/08/2016 for sepsis, UTI and pneumonia involving the left lung. he was treated with IV vancomycin and Zosyn and then meropenem. Was discharged home on oral Bactrim for 2 weeks none of his vascular test or x-rays were done and we will reorder these. 01/26/2016 -- he has not yet done the x-ray of his foot and is vascular tests are still pending. I have asked him to work on these with his nursing home staff. Addendum: he has got an x-ray of the right foot done which shows that his osteopenia but no specific ostial lysis or abnormal periosteal reaction. Final impression was degenerative changes with dorsal foot soft tissue swelling. 02/16/2016 -- lower extremity arterial duplex examination shows a 50-99% stenosis of the right tibioperoneal trunk. He had biphasic flow in the right SFA, popliteal and tibioperoneal trunk. Left-sided he had triphasic flow throughout NASSER, KU ([id number]) 02/29/2016 -- he is awaiting his vascular opinion with Dr. Gilda Crease which is to be done on April  24 03/08/2016 -- he has not kept his appointment with Dr. Gilda Crease on April 24 and does not seem to know what happened about this. it's difficult to gauge whether he is in full  control of his mental faculties as at times he is extremely rude to the nursing staff. 03/22/2016 -- the patient was seen by Dr. 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 Recommended that the patient had severe atherosclerotic changes of both lower extremities associated with ulceration and tissue loss of the foot. This is a limb threatening ischemia and the patient was recommended to undergo angiography of the lower extremity with a hope for intervention for limb salvage. Patient agreed and will proceed to angiography. He was admitted to the hospital between May 2 and 03/15/2016 - he underwent induction of a catheter into his right lower extremity and third order catheter placement with contrast injection to the right lower extremity for distal runoff. Percutaneous transluminal angioplasty of the right superficial femoral and popliteal arteries were done. He also had a right peroneal angioplasty. Patient had a postoperative hematoma and was admitted for observation and in the next 24 hours he was very agitated and combative and had to be seen by psychiatric. He had a follow-up with Dr. Gilda Crease in 3 weeks. 04/18/2016 -- notes reviewed from the vascular office where he was seen for his postop visit after the PTA of the right SFA and popliteal arteries on 03/12/2016. He underwent an ABI which showed right ABI to be more than 1.3 and left to be more than 1 more than 1.3, great toe and PPG waveforms are decreased bilaterally. No additional intervention indicated at this time and the patient was to follow-up in 3 months with an ABI and bilateral lower extremity duplex study. 05/02/2016 -- he complains that his  compression stockings are causing him a lot of discomfort and he is not happy wearing them. 05/30/2016 -- the swelling of both legs has increased again and he has had blisters which have opened out into ulcerations. Objective Constitutional Pulse regular. Respirations normal and unlabored. Afebrile. Vitals Time Taken: 12:44 PM, Height: 70 in, Weight: 235 lbs, BMI: 33.7, Temperature: 97.8 F, Pulse: 60 bpm, Respiratory Rate: 18 breaths/min, Blood Pressure: 118/70 mmHg. Eyes Nonicteric. Reactive to light. Ears, Nose, Mouth, and Throat Lips, teeth, and gums WNL.Marland Kitchen Moist mucosa without lesions. Neck Rezek, Ricki V. ([id number]) supple and nontender. No palpable supraclavicular or cervical adenopathy. Normal sized without goiter. Respiratory WNL. No retractions.. Breath sounds WNL, No rubs, rales, rhonchi, or wheeze.. Cardiovascular Heart rhythm and rate regular, no murmur or gallop.. Pedal Pulses WNL. No clubbing, cyanosis or edema. Chest Breasts symmetical and no nipple discharge.. Breast tissue WNL, no masses, lumps, or tenderness.. Lymphatic No adneopathy. No adenopathy. No adenopathy. Musculoskeletal Adexa without tenderness or enlargement.. Digits and nails w/o clubbing, cyanosis, infection, petechiae, ischemia, or inflammatory conditions.Marland Kitchen Psychiatric Judgement and insight Intact.. No evidence of depression, anxiety, or agitation.. General Notes: on the right calcaneum has more maceration and I have sharply debrided this and the base of the wound and there is some area of unhealthy granulation tissue. Integumentary (Hair, Skin) No suspicious lesions. No crepitus or fluctuance. No peri-wound warmth or erythema. No masses.. Wound #1 status is Open. Original cause of wound was Gradually Appeared. The wound is located on the Left Lower Leg. The wound measures 0.1cm length x 0.1cm width x 0.1cm depth; 0.008cm^2 area and 0.001cm^3 volume. The wound is limited to skin breakdown. There  is no tunneling or undermining noted. There is a large amount of serous drainage noted. The wound margin is flat and intact.  There is large (67- 100%) red, pink granulation within the wound bed. There is a small (1-33%) amount of necrotic tissue within the wound bed including Adherent Slough. The periwound skin appearance exhibited: Localized Edema, Moist, Erythema. The surrounding wound skin color is noted with erythema which is circumferential. Periwound temperature was noted as No Abnormality. The periwound has tenderness on palpation. Wound #2 status is Open. Original cause of wound was Gradually Appeared. The wound is located on the Right Lower Leg. The wound measures 0.1cm length x 0.1cm width x 0.1cm depth; 0.008cm^2 area and 0.001cm^3 volume. The wound is limited to skin breakdown. There is no tunneling or undermining noted. There is a large amount of serous drainage noted. The wound margin is flat and intact. There is large (67- 100%) red, pink granulation within the wound bed. There is a small (1-33%) amount of necrotic tissue within the wound bed including Adherent Slough. The periwound skin appearance exhibited: Localized Edema, Moist, Erythema. The periwound skin appearance did not exhibit: Maceration. The surrounding wound skin color is noted with erythema which is circumferential. Periwound temperature was noted as No Abnormality. The periwound has tenderness on palpation. Wound #3 status is Open. Original cause of wound was Pressure Injury. The wound is located on the Right Calcaneus. The wound measures 0.4cm length x 1.5cm width x 0.2cm depth; 0.471cm^2 area and 0.094cm^3 volume. The wound is limited to skin breakdown. There is no tunneling or undermining noted. There is a large amount of serosanguineous drainage noted. The wound margin is flat and intact. There is Dietrick, Braiden V. ([id number]) medium (34-66%) red, pink granulation within the wound bed. There is a medium (34-66%)  amount of necrotic tissue within the wound bed including Adherent Slough. The periwound skin appearance exhibited: Localized Edema, Maceration, Moist, Erythema. The surrounding wound skin color is noted with erythema which is circumferential. Periwound temperature was noted as No Abnormality. The periwound has tenderness on palpation. Wound #8 status is Open. Original cause of wound was Trauma. The wound is located on the Right,Dorsal Toe Third. The wound measures 0.3cm length x 0.4cm width x 0.1cm depth; 0.094cm^2 area and 0.009cm^3 volume. The wound is limited to skin breakdown. There is no tunneling or undermining noted. There is a large amount of serosanguineous drainage noted. The wound margin is distinct with the outline attached to the wound base. There is large (67-100%) red granulation within the wound bed. There is a small (1-33%) amount of necrotic tissue within the wound bed including Adherent Slough. The periwound skin appearance exhibited: Moist. Periwound temperature was noted as No Abnormality. The periwound has tenderness on palpation. 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 There was a Skin/Subcutaneous Tissue Debridement ([id number]) debridement with total area of 0.6 sq cm performed by Evlyn Kanner, MD. with the following instrument(s): Curette including Exudate, Fibrin/Slough, Skin, Callus, and Subcutaneous after achieving pain control using Lidocaine 4% Topical Solution. A time out was conducted at 13:16,  prior to the start of the procedure. A Minimum amount of bleeding was controlled with Pressure. The procedure was  tolerated well with a pain level of 0 throughout and a pain level of 0 following the procedure. Post Debridement Measurements: 0.4cm length x 1.5cm width x 0.2cm depth; 0.094cm^3 volume. Post debridement Stage noted as Category/Stage III. Character of Wound/Ulcer Post Debridement requires further debridement. Severity of Tissue Post Debridement is: Fat layer exposed. Post procedure Diagnosis Wound #3: Same as Pre-Procedure Tempesta, Earon V. ([id number]) Plan Wound Cleansing: Wound #1 Left Lower Leg: No tub bath. Other: - Please do sink or bed baths **do not get leg wraps wet** Wound #2 Right Lower Leg: No tub bath. Other: - Please do sink or bed baths **do not get leg wraps wet** Wound #3 Right Calcaneus: No tub bath. Other: - Please do sink or bed baths **do not get leg wraps wet** Wound #8 Right,Dorsal Toe Third: No tub bath. Other: - Please dont sink or bed baths **do not get leg wraps wet** Anesthetic: Wound #1 Left Lower Leg: Topical Lidocaine 4% cream applied to wound bed prior to debridement - for clinic use only Topical Lidocaine 4% cream applied to wound bed prior to debridement - for clinic use only Wound #2 Right Lower Leg: Topical Lidocaine 4% cream applied to wound bed prior to debridement - for clinic use only Topical Lidocaine 4% cream applied to wound bed prior to debridement - for clinic use only Wound #3 Right Calcaneus: Topical Lidocaine 4% cream applied to wound bed prior to debridement - for clinic use only Topical Lidocaine 4% cream applied to wound bed prior to debridement - for clinic use only Wound #8 Right,Dorsal Toe Third: Topical Lidocaine 4% cream applied to wound bed prior to debridement - for clinic use only Topical Lidocaine 4% cream applied to wound bed prior to debridement - for clinic use only Skin Barriers/Peri-Wound Care: Wound #1  Left Lower Leg: Barrier cream - around the heel wound to reddened and irritated areas Wound #2 Right Lower Leg: Barrier cream - around the heel wound to reddened and irritated areas Wound #3 Right Calcaneus: Barrier cream - around the heel wound to reddened and irritated areas Primary Wound Dressing: Wound #1 Left Lower Leg: Aquacel Ag Wound #2 Right Lower Leg: Aquacel Ag Wound #3 Right Calcaneus: Aquacel Ag Wound #8 Right,Dorsal Toe Third: Aquacel Ag Beaubien, Andreas VMarland Kitchen ([id number]) Secondary Dressing: Wound #1 Left Lower Leg: ABD pad Wound #2 Right Lower Leg: ABD pad Wound #3 Right Calcaneus: ABD pad Dry Gauze Conform/Kerlix Wound #8 Right,Dorsal Toe Third: Dry Gauze Conform/Kerlix Dressing Change Frequency: Wound #1 Left Lower Leg: Change dressing every week - at the wound clinic Wound #2 Right Lower Leg: Change dressing every week - at the wound clinic Wound #3 Right Calcaneus: Change dressing every day. - [facility] RN to change Wound #8 Right,Dorsal Toe Third: Change dressing every other day. Follow-up Appointments: Wound #1 Left Lower Leg: Return Appointment in 1 week. Wound #2 Right Lower Leg: Return Appointment in 1 week. Wound #3 Right Calcaneus: Return Appointment in 1 week. Wound #8 Right,Dorsal Toe Third: Return Appointment in 1 week. Edema Control: Wound #1 Left Lower Leg: 3 Layer Compression System - Bilateral Elevate legs to the level of the heart and pump ankles as often as possible Wound #2 Right Lower Leg: 3 Layer Compression System - Bilateral Elevate legs to the level of the heart and pump ankles as often as possible Off-Loading: Wound #1 Left Lower Leg: Turn and reposition every 2 hours Other: - sage boots at night and float heel when lying in bed  Wound #2 Right Lower Leg: Turn and reposition every 2 hours Other: - sage boots at night and float heel when lying in bed Wound #3 Right Calcaneus: Turn and reposition every 2 hours Other: - sage  boots at night and float heel when lying in bed Additional Orders / Instructions: Wound #1 Left Lower Leg: Increase protein intake. Other: - LOW SALT DIET Yono, Willman V. ([id number]) Wound #2 Right Lower Leg: Increase protein intake. Wound #3 Right Calcaneus: Increase protein intake. Wound #8 Right,Dorsal Toe Third: Increase protein intake. Other: - LOW SALT DIET Medications-please add to medication list.: Wound #1 Left Lower Leg: Other: - Vitamin C, Vitamin A, Zinc, multivitamin Wound #2 Right Lower Leg: Other: - Vitamin C, Vitamin A, Zinc, multivitamin Wound #3 Right Calcaneus: Other: - Vitamin C, Vitamin A, Zinc, multivitamin Wound #8 Right,Dorsal Toe Third: Other: - Vitamin C, Vitamin A, Zinc, multivitamin I have recommended: 1. Aquacell Ag to his right heel and may be change daily if needed 2. Constant off loading and also using Sage boot. 3. we will use Silver alginate and 3 layer Profore compressions for both lower extremities. 4. him to be very careful about his salt intake and have recommended a low-salt diet 5. High-protein diet and multivitamins including vitamin C and zinc. 6. benefit from lymphedema pumps to be used bilaterally and I have made my recommendations as above in my assessment. Electronic Signature(s) Signed: 07/25/2016 1:23:14 PM By: Evlyn Kanner MD, FACS Entered By: Evlyn Kanner on 07/25/2016 13:23:14 [name] Otto ([id number]) -------------------------------------------------------------------------------- SuperBill Details Patient Name: [name] Otto Date of Service: 07/25/2016 Medical Record Number: [id number] Patient Account Number: [id number] Date of Birth/Sex: 06-01-1946 (70 y.o. 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 Facility Procedures CPT4: Description Modifier Quantity Code [id number] 11042 - DEB SUBQ TISSUE 20 SQ CM/< 1 ICD-10 Description Diagnosis E11.621 Type 2 diabetes mellitus with foot ulcer L89.613 Pressure ulcer of right heel, stage 3 I89.0 Lymphedema, not elsewhere classified  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 ([id number]) Electronic Signature(s) Signed: 07/25/2016 1:23:34 PM By: Evlyn Kanner MD, FACS Entered By: Evlyn Kanner on 07/25/2016 13:23:33

## 2016-08-01 ENCOUNTER — Encounter: Payer: Medicare Other | Admitting: Surgery

## 2016-08-01 DIAGNOSIS — E11621 Type 2 diabetes mellitus with foot ulcer: Secondary | ICD-10-CM | POA: Diagnosis not present

## 2016-08-03 NOTE — Progress Notes (Signed)
DELONTE, MUSICH ([id number]) Visit Report for 08/01/2016 Chief Complaint Document Details Patient Name: [name], [name] Date of Service: 08/01/2016 12:45 PM Medical Record Number: [id number] Patient Account Number: [id number] Date of Birth/Sex: Feb 22, 1946 (70 y.o. Male) Treating RN: Phillis Haggis Primary Care Physician: Oretha Milch Other Clinician: Referring Physician: Oretha Milch Treating Physician/Extender: Rudene Re in Treatment: 30 Information Obtained from: Patient Chief Complaint Patient is at the clinic for treatment of an open pressure ulcer the left upper thigh and gluteal region and the right heel with bilateral swelling of the legs all of it which is going on for over a year Electronic Signature(s) Signed: 08/01/2016 1:26:00 PM By: Evlyn Kanner MD, FACS Entered By: Evlyn Kanner on 08/01/2016 13:26:00 [name] ([id number]) -------------------------------------------------------------------------------- Debridement Details Patient Name: [name] Date of Service: 08/01/2016 12:45 PM Medical Record Number: [id number] Patient Account Number: [id number] Date of Birth/Sex: 01/01/1946 (70 y.o. Male) Treating RN: Phillis Haggis Primary Care Physician: Oretha Milch Other Clinician: Referring Physician: Oretha Milch Treating Physician/Extender: Rudene Re in Treatment: 30 Debridement Performed for Wound #3 Right Calcaneus Assessment: Performed By: Physician Evlyn Kanner, MD Debridement: Debridement Pre-procedure Yes - 13:14 Verification/Time Out Taken: Start Time: 13:14 Pain Control: Lidocaine 4% Topical Solution Level: Skin/Subcutaneous Tissue Total Area Debrided (L x 1 (cm) x 2.2 (cm) = 2.2 (cm) W): Tissue and other Viable, Non-Viable, Exudate, Fibrin/Slough, Subcutaneous material debrided: Instrument: Curette Bleeding: Minimum Hemostasis Achieved: Pressure End Time: 13:17 Procedural Pain: 0 Post Procedural Pain: 0 Response  to Treatment: Procedure was tolerated well Post Debridement Measurements of Total Wound Length: (cm) 1.5 Stage: Category/Stage III Width: (cm) 2.5 Depth: (cm) 0.3 Volume: (cm) 0.884 Character of Wound/Ulcer Post Requires Further Debridement: Debridement Severity of Tissue Post Fat layer exposed Debridement: Post Procedure Diagnosis Same as Pre-procedure Electronic Signature(s) Signed: 08/01/2016 1:25:53 PM By: Evlyn Kanner MD, FACS Signed: 08/02/2016 5:24:41 PM By: Alejandro Mulling Entered By: Evlyn Kanner on 08/01/2016 13:25:53 Hoskinson, Jaleen V. ([id number]) [name], Cam V. ([id number]) -------------------------------------------------------------------------------- HPI Details Patient Name: [name] Date of Service: 08/01/2016 12:45 PM Medical Record Number: [id number] Patient Account Number: [id number] Date of Birth/Sex: [birth year] (70 y.o. Male) Treating RN: Phillis Haggis Primary Care Physician: Oretha Milch Other Clinician: Referring Physician: Oretha Milch Treating Physician/Extender: Rudene Re in Treatment: 30 History of Present Illness Location: ulcerated area on the right heel, left gluteal region and thigh and then bilateral lower extremities Quality: Patient reports experiencing a sharp pain to affected area(s). Severity: Patient states wound are getting worse. Duration: Patient has had the wound for > 12 months prior to seeking treatment at the wound center Timing: Pain in wound is constant (hurts all the time) Context: The wound appeared gradually over time Modifying Factors: Other treatment(s) tried include: he sees his heart doctor and his primary care doctor Associated Signs and Symptoms: patient has not been able to walk for over a year now HPI Description: 70 year old gentleman with a known history of hypertension, diabetes, obstructive sleep apnea, COPD, diastolic CHF, coronary artery disease was admitted to the hospital with sepsis from  an ulcer of the right heel and was treated there in October 2016. he also has chronic bilateral lower extremity edema and lymphedema. He had received vancomycin, Zosyn and at that stage and x-rays showed hardware in the right ankle but no evidence of osteomyelitis. he was a former smoker. he was also treated with Augmentin orally for 10 days. He is either bedbound or wheelchair-bound and does not ambulate by himself. 01/19/2016 --  he has not been seen here for 3 weeks and this was because he was admitted to [facility] between 223 and 01/08/2016 for sepsis, UTI and pneumonia involving the left lung. he was treated with IV vancomycin and Zosyn and then meropenem. Was discharged home on oral Bactrim for 2 weeks none of his vascular test or x-rays were done and we will reorder these. 01/26/2016 -- he has not yet done the x-ray of his foot and is vascular tests are still pending. I have asked him to work on these with his nursing home staff. Addendum: he has got an x-ray of the right foot done which shows that his osteopenia but no specific ostial lysis or abnormal periosteal reaction. Final impression was degenerative changes with dorsal foot soft tissue swelling. 02/16/2016 -- lower extremity arterial duplex examination shows a 50-99% stenosis of the right tibioperoneal trunk. He had biphasic flow in the right SFA, popliteal and tibioperoneal trunk. Left-sided he had triphasic flow throughout 02/29/2016 -- he is awaiting his vascular opinion with Dr. Gilda CreaseSchnier which is to be done on April 24 03/08/2016 -- he has not kept his appointment with Dr. Gilda CreaseSchnier on April 24 and does not seem to know what happened about this. it's difficult to gauge whether he is in full control of his mental faculties as at times he is extremely rude to the nursing staff. 03/22/2016 -- the patient was seen by Dr. 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 Recommended that the patient had severe atherosclerotic changes of both lower extremities associated with ulceration and tissue loss of the foot. This is a limb threatening ischemia and the patient was recommended to undergo Shi, Tashi V. ([id number]) angiography of the lower extremity with a hope for intervention for limb salvage. Patient agreed and will proceed to angiography. He was admitted to the hospital between May 2 and 03/15/2016 - he underwent induction of a catheter into his right lower extremity and third order catheter placement with contrast injection to the right lower extremity for distal runoff. Percutaneous transluminal angioplasty of the right superficial femoral and popliteal arteries were done. He also had a right peroneal angioplasty. Patient had a postoperative hematoma and was admitted for observation and in the next 24 hours he was very agitated and combative and had to be seen by psychiatric. He had a follow-up with Dr. Gilda CreaseSchnier in 3 weeks. 04/18/2016 -- notes reviewed from the vascular office where he was seen for his postop visit after the PTA of the right SFA and popliteal arteries on 03/12/2016. He underwent an ABI which showed right ABI to be more than 1.3 and left to be more than 1 more than 1.3, great toe and PPG waveforms are decreased bilaterally. No additional intervention indicated at this time and the patient was to follow-up in 3 months with an ABI and bilateral lower extremity duplex study. 05/02/2016 -- he complains that his compression stockings are causing him a lot of discomfort and he is not happy wearing them. 05/30/2016 -- the swelling of both legs has increased again and he has had blisters which have opened out into ulcerations. Electronic Signature(s) Signed: 08/01/2016 1:26:07 PM By: Evlyn KannerBritto, Braedin Millhouse MD, FACS Entered By: Evlyn KannerBritto, Daphene Chisholm on 08/01/2016 13:26:07 [name] OttoLINDLEY, Sherard  V. ([id number]) -------------------------------------------------------------------------------- Physical Exam Details Patient Name: [name] OttoLINDLEY, Shoji V. Date of Service: 08/01/2016 12:45 PM Medical Record Number: [id number] Patient Account Number:  [id number] Date of Birth/Sex: Apr 09, 1946 (70 y.o. Male) Treating RN: Ashok Cordia, Debi Primary Care Physician: Oretha Milch Other Clinician: Referring Physician: Oretha Milch Treating Physician/Extender: Rudene Re in Treatment: 30 Constitutional . Pulse regular. Respirations normal and unlabored. Afebrile. . Eyes Nonicteric. Reactive to light. Ears, Nose, Mouth, and Throat Lips, teeth, and gums WNL.Marland Kitchen Moist mucosa without lesions. Neck supple and nontender. No palpable supraclavicular or cervical adenopathy. Normal sized without goiter. Respiratory WNL. No retractions.. Cardiovascular Pedal Pulses WNL. No clubbing, cyanosis or edema. Lymphatic No adneopathy. No adenopathy. No adenopathy. Musculoskeletal Adexa without tenderness or enlargement.. Digits and nails w/o clubbing, cyanosis, infection, petechiae, ischemia, or inflammatory conditions.. Integumentary (Hair, Skin) No suspicious lesions. No crepitus or fluctuance. No peri-wound warmth or erythema. No masses.Marland Kitchen Psychiatric Judgement and insight Intact.. No evidence of depression, anxiety, or agitation.. Notes the lymphedema both lower extremities looking better and there is minimal weeping. Right calcaneus wound has maceration persisting and had to sharply debrided with a #3 curet and even freshen the base which bled briskly and this was controlled with pressure Electronic Signature(s) Signed: 08/01/2016 1:26:49 PM By: Evlyn Kanner MD, FACS Entered By: Evlyn Kanner on 08/01/2016 13:26:48 [name] ([id number]) -------------------------------------------------------------------------------- Physician Orders Details Patient Name: [name] Date of Service:  08/01/2016 12:45 PM Medical Record Number: [id number] Patient Account Number: [id number] Date of Birth/Sex: 1946/06/10 (70 y.o. Male) Treating RN: Phillis Haggis Primary Care Physician: Oretha Milch Other Clinician: Referring Physician: Oretha Milch Treating Physician/Extender: Rudene Re in Treatment: 30 Verbal / Phone Orders: Yes Clinician: Ashok Cordia, Debi Read Back and Verified: Yes Diagnosis Coding Wound Cleansing Wound #1 Left Lower Leg o No tub bath. o Other: - Please do sink or bed baths **do not get leg wraps wet** Wound #2 Right Lower Leg o No tub bath. o Other: - Please do sink or bed baths **do not get leg wraps wet** Wound #3 Right Calcaneus o No tub bath. o Other: - Please do sink or bed baths **do not get leg wraps wet** Anesthetic Wound #3 Right Calcaneus o Topical Lidocaine 4% cream applied to wound bed prior to debridement - for clinic use only o Topical Lidocaine 4% cream applied to wound bed prior to debridement - for clinic use only Skin Barriers/Peri-Wound Care Wound #3 Right Calcaneus o Barrier cream - around the heel wound to reddened and irritated areas Primary Wound Dressing Wound #1 Left Lower Leg o Aquacel Ag Wound #2 Right Lower Leg o Aquacel Ag Wound #3 Right Calcaneus o Aquacel Ag Secondary Dressing Blankenhorn, Eldar V. ([id number]) Wound #3 Right Calcaneus o ABD pad o Dry Gauze o Conform/Kerlix o Drawtex Dressing Change Frequency Wound #1 Left Lower Leg o Change dressing every week - at the wound clinic Wound #2 Right Lower Leg o Change dressing every week - at the wound clinic Wound #3 Right Calcaneus o Change dressing every day. - SN[facility] RN to change Follow-up Appointments Wound #1 Left Lower Leg o Return Appointment in 1 week. Wound #2 Right Lower Leg o Return Appointment in 1 week. Wound #3 Right Calcaneus o Return Appointment in 1 week. Edema Control Wound #1 Left Lower Leg o  3 Layer Compression System - Bilateral o Elevate legs to the level of the heart and pump ankles as often as possible Wound #2 Right Lower Leg o 3 Layer Compression System - Bilateral o Elevate legs to the level of the heart and pump ankles as often as possible Off-Loading Wound #1 Left Lower Leg o Turn  and reposition every 2 hours o Other: - sage boots at night and float heel when lying in bed Wound #2 Right Lower Leg o Turn and reposition every 2 hours o Other: - sage boots at night and float heel when lying in bed Wound #3 Right Calcaneus Riedlinger, Kou V. ([id number]) o Turn and reposition every 2 hours o Other: - sage boots at night and float heel when lying in bed Additional Orders / Instructions Wound #1 Left Lower Leg o Increase protein intake. o Other: - LOW SALT DIET Wound #2 Right Lower Leg o Increase protein intake. Wound #3 Right Calcaneus o Increase protein intake. Medications-please add to medication list. Wound #1 Left Lower Leg o Other: - Vitamin C, Vitamin A, Zinc, multivitamin Wound #2 Right Lower Leg o Other: - Vitamin C, Vitamin A, Zinc, multivitamin Wound #3 Right Calcaneus o Other: - Vitamin C, Vitamin A, Zinc, multivitamin Electronic Signature(s) Signed: 08/01/2016 4:25:20 PM By: Evlyn Kanner MD, FACS Signed: 08/02/2016 5:24:41 PM By: Alejandro Mulling Entered By: Alejandro Mulling on 08/01/2016 13:27:12 Drinkard, Vartan Seth Bake ([id number]) -------------------------------------------------------------------------------- Problem List Details Patient Name: [name] Date of Service: 08/01/2016 12:45 PM Medical Record Number: [id number] Patient Account Number: [id number] Date of Birth/Sex: July 14, 1946 (70 y.o. Male) Treating RN: Phillis Haggis Primary Care Physician: Oretha Milch Other Clinician: Referring Physician: Oretha Milch Treating Physician/Extender: Rudene Re in Treatment: 30 Active  Problems ICD-10 Encounter Code Description Active Date Diagnosis E11.621 Type 2 diabetes mellitus with foot ulcer 01/01/2016 Yes L89.613 Pressure ulcer of right heel, stage 3 01/01/2016 Yes E66.01 Morbid (severe) obesity due to excess calories 01/01/2016 Yes I89.0 Lymphedema, not elsewhere classified 01/01/2016 Yes M70.871 Other soft tissue disorders related to use, overuse and 01/19/2016 Yes pressure, right ankle and foot 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/16/2016 Yes ulceration of heel and midfoot Inactive Problems Resolved Problems ICD-10 Code Description Active Date Resolved Date L89.322 Pressure ulcer of left buttock, stage 2 01/01/2016 01/01/2016 Electronic Signature(s) Signed: 08/01/2016 1:25:36 PM By: Evlyn Kanner MD, FACS Entered By: Evlyn Kanner on 08/01/2016 13:25:36 [name] ([id number]) [name], Inez VMarland Kitchen ([id number]) -------------------------------------------------------------------------------- Progress Note Details Patient Name: [name] Date of Service: 08/01/2016 12:45 PM Medical Record Number: [id number] Patient Account Number: [id number] Date of Birth/Sex: 06-29-1946 (70 y.o. Male) Treating RN: Phillis Haggis Primary Care Physician: Oretha Milch Other Clinician: Referring Physician: Oretha Milch Treating Physician/Extender: Rudene Re in Treatment: 30 Subjective Chief Complaint Information obtained from Patient Patient is at the clinic for treatment of an open pressure ulcer the left upper thigh and gluteal region and the right heel with bilateral swelling of the legs all of it which is going on for over a year History of Present Illness (HPI) The following HPI elements were documented for the patient's wound: Location: ulcerated area on the right heel, left gluteal region and thigh and then bilateral lower extremities Quality: Patient reports experiencing a sharp pain to affected area(s). Severity: Patient  states wound are getting worse. Duration: Patient has had the wound for > 12 months prior to seeking treatment at the wound center Timing: Pain in wound is constant (hurts all the time) Context: The wound appeared gradually over time Modifying Factors: Other treatment(s) tried include: he sees his heart doctor and his primary care doctor Associated Signs and Symptoms: patient has not been able to walk for over a year now 70 year old gentleman with a known history of hypertension, diabetes, obstructive sleep apnea, COPD, diastolic CHF, coronary artery disease  was admitted to the hospital with sepsis from an ulcer of the right heel and was treated there in October 2016. he also has chronic bilateral lower extremity edema and lymphedema. He had received vancomycin, Zosyn and at that stage and x-rays showed hardware in the right ankle but no evidence of osteomyelitis. he was a former smoker. he was also treated with Augmentin orally for 10 days. He is either bedbound or wheelchair-bound and does not ambulate by himself. 01/19/2016 -- he has not been seen here for 3 weeks and this was because he was admitted to [facility] between 223 and 01/08/2016 for sepsis, UTI and pneumonia involving the left lung. he was treated with IV vancomycin and Zosyn and then meropenem. Was discharged home on oral Bactrim for 2 weeks none of his vascular test or x-rays were done and we will reorder these. 01/26/2016 -- he has not yet done the x-ray of his foot and is vascular tests are still pending. I have asked him to work on these with his nursing home staff. Addendum: he has got an x-ray of the right foot done which shows that his osteopenia but no specific ostial lysis or abnormal periosteal reaction. Final impression was degenerative changes with dorsal foot soft tissue swelling. 02/16/2016 -- lower extremity arterial duplex examination shows a 50-99% stenosis of the right tibioperoneal  trunk. He had biphasic flow in the right SFA, popliteal and tibioperoneal trunk. Left-sided he had triphasic flow throughout KEIDAN, AUMILLER ([id number]) 02/29/2016 -- he is awaiting his vascular opinion with Dr. Gilda Crease which is to be done on April 24 03/08/2016 -- he has not kept his appointment with Dr. Gilda Crease on April 24 and does not seem to know what happened about this. it's difficult to gauge whether he is in full control of his mental faculties as at times he is extremely rude to the nursing staff. 03/22/2016 -- the patient was seen by Dr. 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 Recommended that the patient had severe atherosclerotic changes of both lower extremities associated with ulceration and tissue loss of the foot. This is a limb threatening ischemia and the patient was recommended to undergo angiography of the lower extremity with a hope for intervention for limb salvage. Patient agreed and will proceed to angiography. He was admitted to the hospital between May 2 and 03/15/2016 - he underwent induction of a catheter into his right lower extremity and third order catheter placement with contrast injection to the right lower extremity for distal runoff. Percutaneous transluminal angioplasty of the right superficial femoral and popliteal arteries were done. He also had a right peroneal angioplasty. Patient had a postoperative hematoma and was admitted for observation and in the next 24 hours he was very agitated and combative and had to be seen by psychiatric. He had a follow-up with Dr. Gilda Crease in 3 weeks. 04/18/2016 -- notes reviewed from the vascular office where he was seen for his postop visit after the PTA of the right SFA and popliteal arteries on 03/12/2016. He underwent an ABI which showed right ABI to be more than 1.3 and left to be more than 1 more than  1.3, great toe and PPG waveforms are decreased bilaterally. No additional intervention indicated at this time and the patient was to follow-up in 3 months with an ABI and bilateral lower extremity duplex study. 05/02/2016 -- he complains that  his compression stockings are causing him a lot of discomfort and he is not happy wearing them. 05/30/2016 -- the swelling of both legs has increased again and he has had blisters which have opened out into ulcerations. Objective Constitutional Pulse regular. Respirations normal and unlabored. Afebrile. Vitals Time Taken: 12:37 PM, Height: 70 in, Weight: 235 lbs, BMI: 33.7, Temperature: 98.3 F, Pulse: 55 bpm, Respiratory Rate: 18 breaths/min, Blood Pressure: 132/54 mmHg. General Notes: Made Dr. Meyer Russel aware of pts HR and diastolic BP. Eyes Nonicteric. Reactive to light. Ears, Nose, Mouth, and Throat Lips, teeth, and gums WNL.Marland Kitchen Moist mucosa without lesions. Howland, Glennis V. ([id number]) Neck supple and nontender. No palpable supraclavicular or cervical adenopathy. Normal sized without goiter. Respiratory WNL. No retractions.. Cardiovascular Pedal Pulses WNL. No clubbing, cyanosis or edema. Lymphatic No adneopathy. No adenopathy. No adenopathy. Musculoskeletal Adexa without tenderness or enlargement.. Digits and nails w/o clubbing, cyanosis, infection, petechiae, ischemia, or inflammatory conditions.Marland Kitchen Psychiatric Judgement and insight Intact.. No evidence of depression, anxiety, or agitation.. General Notes: the lymphedema both lower extremities looking better and there is minimal weeping. Right calcaneus wound has maceration persisting and had to sharply debrided with a #3 curet and even freshen the base which bled briskly and this was controlled with pressure Integumentary (Hair, Skin) No suspicious lesions. No crepitus or fluctuance. No peri-wound warmth or erythema. No masses.. Wound #1 status is Open. Original cause of wound was  Gradually Appeared. The wound is located on the Left Lower Leg. The wound measures 3.5cm length x 2cm width x 0.1cm depth; 5.498cm^2 area and 0.55cm^3 volume. The wound is limited to skin breakdown. There is no tunneling or undermining noted. There is a medium amount of serous drainage noted. The wound margin is flat and intact. There is large (67-100%) red, pink granulation within the wound bed. There is a small (1-33%) amount of necrotic tissue within the wound bed including Adherent Slough. The periwound skin appearance exhibited: Localized Edema, Moist, Erythema. The surrounding wound skin color is noted with erythema which is circumferential. Periwound temperature was noted as No Abnormality. The periwound has tenderness on palpation. Wound #2 status is Open. Original cause of wound was Gradually Appeared. The wound is located on the Right Lower Leg. The wound measures 0.1cm length x 0.1cm width x 0.1cm depth; 0.008cm^2 area and 0.001cm^3 volume. The wound is limited to skin breakdown. There is no tunneling or undermining noted. There is a small amount of serous drainage noted. The wound margin is flat and intact. There is large (67- 100%) red, pink granulation within the wound bed. There is a small (1-33%) amount of necrotic tissue within the wound bed including Adherent Slough. The periwound skin appearance exhibited: Localized Edema, Moist, Erythema. The periwound skin appearance did not exhibit: Maceration. The surrounding wound skin color is noted with erythema which is circumferential. Periwound temperature was noted as No Abnormality. The periwound has tenderness on palpation. Wound #3 status is Open. Original cause of wound was Pressure Injury. The wound is located on the Right Calcaneus. The wound measures 1cm length x 2.2cm width x 0.2cm depth; 1.728cm^2 area and 0.346cm^3 volume. The wound is limited to skin breakdown. There is no tunneling or undermining noted. There is a large  amount of serosanguineous drainage noted. The wound margin is flat and intact. There is medium (34-66%) red, pink granulation within the wound bed. There is a medium (34-66%) amount of Fanelli, Ranson V. ([id number]) necrotic tissue within the wound bed including Adherent Slough. The periwound skin  appearance exhibited: Localized Edema, Maceration, Moist, Erythema. The surrounding wound skin color is noted with erythema which is circumferential. Periwound temperature was noted as No Abnormality. The periwound has tenderness on palpation. Wound #8 status is Open. Original cause of wound was Trauma. The wound is located on the Right,Dorsal Toe Third. The wound measures 0cm length x 0cm width x 0cm depth; 0cm^2 area and 0cm^3 volume. The wound is limited to skin breakdown. There is no tunneling or undermining noted. There is a none present amount of drainage noted. The wound margin is distinct with the outline attached to the wound base. There is no granulation within the wound bed. There is no necrotic tissue within the wound bed. The periwound skin appearance did not exhibit: Moist. Periwound temperature was noted as No Abnormality. The periwound has tenderness on palpation. 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 There was a Skin/Subcutaneous Tissue Debridement ([id number]) debridement with total area of 2.2 sq cm performed by Evlyn Kanner, MD. with the following instrument(s): Curette to remove Viable and Non-Viable  tissue/material including Exudate, Fibrin/Slough, and Subcutaneous after achieving pain control using Lidocaine 4% Topical Solution. A time out was conducted at 13:14, prior to the start of the procedure. A Minimum amount of bleeding was controlled with Pressure. The procedure was tolerated well with a pain level of 0 throughout and a pain level of 0 following the procedure. Post Debridement Measurements: 1.5cm length x 2.5cm width x 0.3cm depth; 0.884cm^3 volume. Post debridement Stage noted as Category/Stage III. Character of Wound/Ulcer Post Debridement requires further debridement. Severity of Tissue Post Debridement is: Fat layer exposed. Post procedure Diagnosis Wound #3: Same as Pre-Procedure Bottomley, Gregery V. ([id number]) Plan I have recommended: 1. Aquacell Ag and then cover it with Drawtex to his right heel and may be change daily if needed 2. Constant off loading and also using Sage boot. 3. we will use Silver alginate and 3 layer Profore compressions for both lower extremities. 4. him to be very careful about his salt intake and have recommended a low-salt diet 5. High-protein diet and multivitamins including vitamin C and zinc. 6. benefit from lymphedema pumps to be used bilaterally and I have made my recommendations as above in my assessment. Electronic Signature(s) Signed: 08/01/2016 1:27:50 PM By: Evlyn Kanner MD, FACS Entered By: Evlyn Kanner on 08/01/2016 13:27:50 [name] ([id number]) -------------------------------------------------------------------------------- SuperBill Details Patient Name: [name] Date of Service: 08/01/2016 Medical Record Number: [id number] Patient Account Number: [id number] Date of Birth/Sex: [birth year] (70 y.o. 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 Facility Procedures CPT4: Description Modifier  Quantity Code [id number] 11042 - DEB SUBQ TISSUE 20 SQ CM/< 1 ICD-10 Description Diagnosis E11.621 Type 2 diabetes mellitus with foot ulcer L89.613 Pressure ulcer of right heel, stage 3 I89.0 Lymphedema, not elsewhere classified  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 ([id number]) Electronic Signature(s) Signed: 08/01/2016 1:28:23 PM By: Evlyn Kanner MD, FACS Entered By: Evlyn Kanner on 08/01/2016 13:28:22

## 2016-08-03 NOTE — Progress Notes (Signed)
ROBER, SKEELS ([id number]) Visit Report for 08/01/2016 Arrival Information Details Patient Name: KOSTA, SCHNITZLER. Date of Service: 08/01/2016 12:45 PM Medical Record Number: [id number] Patient Account Number: [id number] Date of Birth/Sex: 07-Apr-1946 (70 y.o. Male) Treating RN: Phillis Haggis Primary Care Physician: Oretha Milch Other Clinician: Referring Physician: Oretha Milch Treating Physician/Extender: Rudene Re in Treatment: 30 Visit Information History Since Last Visit All ordered tests and consults were completed: No Patient Arrived: Wheel Chair Added or deleted any medications: No Arrival Time: 12:37 Any new allergies or adverse reactions: No Accompanied By: self Had a fall or experienced change in No Transfer Assistance: EasyPivot activities of daily living that may affect Patient Lift risk of falls: Patient Identification Verified: Yes Signs or symptoms of abuse/neglect since last No Secondary Verification Process Yes visito Completed: Hospitalized since last visit: No Patient Requires Transmission- No Pain Present Now: No Based Precautions: Patient Has Alerts: Yes Patient Alerts: DM II Electronic Signature(s) Signed: 08/02/2016 5:24:41 PM By: Alejandro Mulling Entered By: Alejandro Mulling on 08/01/2016 12:37:44 [name] ([id number]) -------------------------------------------------------------------------------- Encounter Discharge Information Details Patient Name: [name]. Date of Service: 08/01/2016 12:45 PM Medical Record Number: [id number] Patient Account Number: [id number] Date of Birth/Sex: 11/25/1945 (70 y.o. Male) Treating RN: Phillis Haggis Primary Care Physician: Oretha Milch Other Clinician: Referring Physician: Oretha Milch Treating Physician/Extender: Rudene Re in Treatment: 30 Encounter Discharge Information Items Discharge Pain Level: 0 Discharge Condition: Stable Ambulatory Status: Wheelchair Discharge  Destination: Nursing Home Transportation: Other Accompanied By: self Schedule Follow-up Appointment: Yes Medication Reconciliation completed and provided to Patient/Care Yes Pamlea Finder: Provided on Clinical Summary of Care: 08/01/2016 Form Type Recipient Paper Patient EL Electronic Signature(s) Signed: 08/02/2016 5:24:41 PM By: Alejandro Mulling Previous Signature: 08/01/2016 1:54:57 PM Version By: Gwenlyn Perking Entered By: Alejandro Mulling on 08/01/2016 13:59:17 Repetto, Trigg Seth Bake ([id number]) -------------------------------------------------------------------------------- Lower Extremity Assessment Details Patient Name: [name]. Date of Service: 08/01/2016 12:45 PM Medical Record Number: [id number] Patient Account Number: [id number] Date of Birth/Sex: 11/13/1945 (70 y.o. Male) Treating RN: Phillis Haggis Primary Care Physician: Oretha Milch Other Clinician: Referring Physician: Oretha Milch Treating Physician/Extender: Rudene Re in Treatment: 30 Edema Assessment Assessed: [Left: No] [Right: No] E[Left: dema] [Right: :] Calf Left: Right: Point of Measurement: 34 cm From Medial Instep 37.8 cm 34 cm Ankle Left: Right: Point of Measurement: 12 cm From Medial Instep 2 cm 24.2 cm Vascular Assessment Pulses: Posterior Tibial Dorsalis Pedis Palpable: [Left:Yes] [Right:Yes] Extremity colors, hair growth, and conditions: Extremity Color: [Left:Hyperpigmented] [Right:Hyperpigmented] Temperature of Extremity: [Left:Warm] [Right:Warm] Capillary Refill: [Left:< 3 seconds] [Right:< 3 seconds] Toe Nail Assessment Left: Right: Thick: Yes Yes Discolored: Yes Yes Deformed: No No Improper Length and Hygiene: No No Electronic Signature(s) Signed: 08/02/2016 5:24:41 PM By: Alejandro Mulling Entered By: Alejandro Mulling on 08/01/2016 12:48:39 Istre, Zuriel VMarland Kitchen ([id number]) -------------------------------------------------------------------------------- Multi Wound Chart  Details Patient Name: [name]. Date of Service: 08/01/2016 12:45 PM Medical Record Number: [id number] Patient Account Number: [id number] Date of Birth/Sex: 1945-12-02 (70 y.o. Male) Treating RN: Phillis Haggis Primary Care Physician: Oretha Milch Other Clinician: Referring Physician: Oretha Milch Treating Physician/Extender: Rudene Re in Treatment: 30 Vital Signs Height(in): 70 Pulse(bpm): 55 Weight(lbs): 235 Blood Pressure 132/54 (mmHg): Body Mass Index(BMI): 34 Temperature(F): 98.3 Respiratory Rate 18 (breaths/min): Photos: [1:No Photos] [2:No Photos] [3:No Photos] Wound Location: [1:Left Lower Leg] [2:Right Lower Leg] [3:Right Calcaneus] Wounding Event: [1:Gradually Appeared] [2:Gradually Appeared] [3:Pressure Injury] Primary Etiology: [1:Venous Leg Ulcer] [2:Venous Leg Ulcer] [3:Pressure Ulcer] Secondary Etiology: [1:N/A] [2:N/A] [3:N/A] Comorbid  History: [1:Cataracts, Chronic Obstructive Pulmonary Disease (COPD), Sleep Disease (COPD), Sleep Disease (COPD), Sleep Apnea, Angina, Congestive Heart Failure, Congestive Heart Failure, Congestive Heart Failure, Hypertension, Type II Diabetes,  Gout, Osteoarthritis, Neuropathy Osteoarthritis, Neuropathy Osteoarthritis, Neuropathy] [2:Cataracts, Chronic Obstructive Pulmonary Apnea, Angina, Hypertension, Type II Diabetes, Gout,] [3:Cataracts, Chronic Obstructive Pulmonary Apnea, Angina,  Hypertension, Type II Diabetes, Gout,] Date Acquired: [1:12/31/2014] [2:12/31/2014] [3:10/31/2015] Weeks of Treatment: [1:30] [2:30] [3:30] Wound Status: [1:Open] [2:Open] [3:Open] Measurements L x W x D 3.5x2x0.1 [2:0.1x0.1x0.1] [3:1x2.2x0.2] (cm) Area (cm) : [1:5.498] [2:0.008] [3:1.728] Volume (cm) : [1:0.55] [2:0.001] [3:0.346] % Reduction in Area: [1:77.60%] [2:96.60%] [3:75.60%] % Reduction in Volume: 77.60% [2:95.80%] [3:75.50%] Classification: [1:Partial Thickness] [2:Partial Thickness] [3:Category/Stage III] HBO  Classification: [1:Grade 1] [2:Grade 1] [3:Grade 1] Exudate Amount: [1:Medium] [2:Small] [3:Large] Exudate Type: [1:Serous] [2:Serous] [3:Serosanguineous] Exudate Color: [1:amber] [2:amber] [3:red, brown] Wound Margin: [1:Flat and Intact] [2:Flat and Intact] [3:Flat and Intact] Granulation Amount: [1:Large (67-100%)] [2:Large (67-100%)] [3:Medium (34-66%)] Granulation Quality: [1:Red, Pink] [2:Red, Pink] [3:Red, Pink] Necrotic Amount: Small (1-33%) Small (1-33%) Medium (34-66%) Exposed Structures: Fascia: No Fascia: No Fascia: No Fat: No Fat: No Fat: No Tendon: No Tendon: No Tendon: No Muscle: No Muscle: No Muscle: No Joint: No Joint: No Joint: No Bone: No Bone: No Bone: No Limited to Skin Limited to Skin Limited to Skin Breakdown Breakdown Breakdown Epithelialization: Medium (34-66%) Medium (34-66%) Medium (34-66%) Periwound Skin Texture: Edema: Yes Edema: Yes Edema: Yes Periwound Skin Moist: Yes Moist: Yes Maceration: Yes Moisture: Maceration: No Moist: Yes Periwound Skin Color: Erythema: Yes Erythema: Yes Erythema: Yes Erythema Location: Circumferential Circumferential Circumferential Temperature: No Abnormality No Abnormality No Abnormality Tenderness on Yes Yes Yes Palpation: Wound Preparation: Ulcer Cleansing: Other: Ulcer Cleansing: Other: Ulcer Cleansing: soap and water soap and water Rinsed/Irrigated with Saline, Other: soap and Topical Anesthetic Topical Anesthetic water Applied: None Applied: None Topical Anesthetic Applied: Other: lidocaine 4% Wound Number: 8 N/A N/A Photos: No Photos N/A N/A Wound Location: Right Toe Third - Dorsal N/A N/A Wounding Event: Trauma N/A N/A Primary Etiology: Diabetic Wound/Ulcer of N/A N/A the Lower Extremity Secondary Etiology: Trauma, Other N/A N/A Comorbid History: Cataracts, Chronic N/A N/A Obstructive Pulmonary Disease (COPD), Sleep Apnea, Angina, Congestive Heart Failure, Hypertension, Type II Diabetes,  Gout, Osteoarthritis, Neuropathy Date Acquired: 07/17/2016 N/A N/A Weeks of Treatment: 2 N/A N/A Wound Status: Open N/A N/A Measurements L x W x D 0x0x0 N/A N/A (cm) Area (cm) : 0 N/A N/A Volume (cm) : 0 N/A N/A % Reduction in Area: 100.00% N/A N/A Claudio, Adric V. ([id number]) % Reduction in Volume: 100.00% N/A N/A Classification: Grade 1 N/A N/A HBO Classification: N/A N/A N/A Exudate Amount: None Present N/A N/A Exudate Type: N/A N/A N/A Exudate Color: N/A N/A N/A Wound Margin: Distinct, outline attached N/A N/A Granulation Amount: None Present (0%) N/A N/A Granulation Quality: N/A N/A N/A Necrotic Amount: None Present (0%) N/A N/A Exposed Structures: Fascia: No N/A N/A Fat: No Tendon: No Muscle: No Joint: No Bone: No Limited to Skin Breakdown Epithelialization: Large (67-100%) N/A N/A Periwound Skin Texture: No Abnormalities Noted N/A N/A Periwound Skin Moist: No N/A N/A Moisture: Periwound Skin Color: No Abnormalities Noted N/A N/A Erythema Location: N/A N/A N/A Temperature: No Abnormality N/A N/A Tenderness on Yes N/A N/A Palpation: Wound Preparation: Ulcer Cleansing: Other: N/A N/A soap and water Topical Anesthetic Applied: None Treatment Notes Electronic Signature(s) Signed: 08/02/2016 5:24:41 PM By: Alejandro Mulling Entered By: Alejandro Mulling on 08/01/2016 12:58:55 [name] ([id number]) -------------------------------------------------------------------------------- Multi-Disciplinary Care Plan Details Patient  Name: [name], [name]. Date of Service: 08/01/2016 12:45 PM Medical Record Number: [id number] Patient Account Number: [id number] Date of Birth/Sex: 1946-04-02 (70 y.o. Male) Treating RN: Phillis Haggis Primary Care Physician: Oretha Milch Other Clinician: Referring Physician: Oretha Milch Treating Physician/Extender: Rudene Re in Treatment: 30 Active Inactive Abuse / Safety / Falls / Self Care Management Nursing  Diagnoses: Potential for falls Goals: Patient will remain injury free Date Initiated: 03/01/2016 Goal Status: Active Interventions: Assess fall risk on admission and as needed Notes: Nutrition Nursing Diagnoses: Imbalanced nutrition Goals: Patient/caregiver agrees to and verbalizes understanding of need to use nutritional supplements and/or vitamins as prescribed Date Initiated: 03/01/2016 Goal Status: Active Interventions: Assess patient nutrition upon admission and as needed per policy Notes: Orientation to the Wound Care Program Nursing Diagnoses: Knowledge deficit related to the wound healing center program Goals: Patient/caregiver will verbalize understanding of the [facility] [address] MARCELO, ICKES ([id number]) Date Initiated: 03/01/2016 Goal Status: Active Interventions: Provide education on orientation to the wound center Notes: Pain, Acute or Chronic Nursing Diagnoses: Pain, acute or chronic: actual or potential Potential alteration in comfort, pain Goals: Patient will verbalize adequate pain control and receive pain control interventions during procedures as needed Date Initiated: 03/01/2016 Goal Status: Active Patient/caregiver will verbalize adequate pain control between visits Date Initiated: 03/01/2016 Goal Status: Active Interventions: Assess comfort goal upon admission Complete pain assessment as per visit requirements Notes: Pressure Nursing Diagnoses: Knowledge deficit related to causes and risk factors for pressure ulcer development Knowledge deficit related to management of pressures ulcers Goals: Patient/caregiver will verbalize risk factors for pressure ulcer development Date Initiated: 03/01/2016 Goal Status: Active Interventions: Assess offloading mechanisms upon admission and as needed Notes: Wound/Skin Impairment Nursing Diagnoses: BUDDY, LOEFFELHOLZ ([id number]) Impaired tissue integrity Goals: Ulcer/skin breakdown will  have a volume reduction of 30% by week 4 Date Initiated: 03/01/2016 Goal Status: Active Ulcer/skin breakdown will have a volume reduction of 50% by week 8 Date Initiated: 03/01/2016 Goal Status: Active Ulcer/skin breakdown will have a volume reduction of 80% by week 12 Date Initiated: 03/01/2016 Goal Status: Active Interventions: Assess ulceration(s) every visit Notes: Electronic Signature(s) Signed: 08/02/2016 5:24:41 PM By: Alejandro Mulling Entered By: Alejandro Mulling on 08/01/2016 12:58:34 Egnor, Jerret Seth Bake ([id number]) -------------------------------------------------------------------------------- Pain Assessment Details Patient Name: [name]. Date of Service: 08/01/2016 12:45 PM Medical Record Number: [id number] Patient Account Number: [id number] Date of Birth/Sex: [birth year] (70 y.o. Male) Treating RN: Phillis Haggis Primary Care Physician: Oretha Milch Other Clinician: Referring Physician: Oretha Milch Treating Physician/Extender: Rudene Re in Treatment: 30 Active Problems Location of Pain Severity and Description of Pain Patient Has Paino No Site Locations With Dressing Change: No Pain Management and Medication Current Pain Management: Electronic Signature(s) Signed: 08/02/2016 5:24:41 PM By: Alejandro Mulling Entered By: Alejandro Mulling on 08/01/2016 12:37:50 [name] ([id number]) -------------------------------------------------------------------------------- Patient/Caregiver Education Details Patient Name: [name]. Date of Service: 08/01/2016 12:45 PM Medical Record Number: [id number] Patient Account Number: [id number] Date of Birth/Gender: [birth year] (70 y.o. Male) Treating RN: Phillis Haggis Primary Care Physician: Oretha Milch Other Clinician: Referring Physician: Oretha Milch Treating Physician/Extender: Rudene Re in Treatment: 30 Education Assessment Education Provided To: Patient Education Topics  Provided Wound/Skin Impairment: Handouts: Other: change dressing as ordered and do not get dressing wet Methods: Demonstration, Explain/Verbal Responses: State content correctly Electronic Signature(s) Signed: 08/02/2016 5:24:41 PM By: Alejandro Mulling Entered By: Alejandro Mulling on 08/01/2016 13:11:03 Carpino, Jesua V. ([id number]) -------------------------------------------------------------------------------- Wound Assessment Details Patient Name: [name]. Date of  Service: 08/01/2016 12:45 PM Medical Record Number: [id number] Patient Account Number: [id number] Date of Birth/Sex: 1946/05/15 (70 y.o. Male) Treating RN: Phillis Haggis Primary Care Physician: Oretha Milch Other Clinician: Referring Physician: Oretha Milch Treating Physician/Extender: Rudene Re in Treatment: 30 Wound Status Wound Number: 1 Primary Venous Leg Ulcer Etiology: Wound Location: Left Lower Leg Wound Open Wounding Event: Gradually Appeared Status: Date Acquired: 12/31/2014 Comorbid Cataracts, Chronic Obstructive Weeks Of Treatment: 30 History: Pulmonary Disease (COPD), Sleep Clustered Wound: No Apnea, Angina, Congestive Heart Failure, Hypertension, Type II Diabetes, Gout, Osteoarthritis, Neuropathy Photos Photo Uploaded By: Alejandro Mulling on 08/01/2016 16:27:32 Wound Measurements Length: (cm) 3.5 Width: (cm) 2 Depth: (cm) 0.1 Area: (cm) 5.498 Volume: (cm) 0.55 % Reduction in Area: 77.6% % Reduction in Volume: 77.6% Epithelialization: Medium (34-66%) Tunneling: No Undermining: No Wound Description Classification: Partial Thickness Foul Odor Af Diabetic Severity (Wagner): Grade 1 Wound Margin: Flat and Intact Exudate Amount: Medium Exudate Type: Serous Exudate Color: amber ter Cleansing: No Wound Bed Granulation Amount: Large (67-100%) Exposed Structure Lipe, Rober V. ([id number]) Granulation Quality: Red, Pink Fascia Exposed: No Necrotic Amount: Small  (1-33%) Fat Layer Exposed: No Necrotic Quality: Adherent Slough Tendon Exposed: No Muscle Exposed: No Joint Exposed: No Bone Exposed: No Limited to Skin Breakdown Periwound Skin Texture Texture Color No Abnormalities Noted: No No Abnormalities Noted: No Localized Edema: Yes Erythema: Yes Erythema Location: Circumferential Moisture No Abnormalities Noted: No Temperature / Pain Moist: Yes Temperature: No Abnormality Tenderness on Palpation: Yes Wound Preparation Ulcer Cleansing: Other: soap and water, Topical Anesthetic Applied: None Treatment Notes Wound #1 (Left Lower Leg) 1. Cleansed with: Cleanse wound with antibacterial soap and water 4. Dressing Applied: Aquacel Ag 7. Secured with Tape 3 Layer Compression System - Bilateral Notes unna to anchor Electronic Signature(s) Signed: 08/02/2016 5:24:41 PM By: Alejandro Mulling Entered By: Alejandro Mulling on 08/01/2016 12:55:35 Munns, Gage V. ([id number]) -------------------------------------------------------------------------------- Wound Assessment Details Patient Name: [name]. Date of Service: 08/01/2016 12:45 PM Medical Record Number: [id number] Patient Account Number: [id number] Date of Birth/Sex: September 28, 1946 (70 y.o. Male) Treating RN: Phillis Haggis Primary Care Physician: Oretha Milch Other Clinician: Referring Physician: Oretha Milch Treating Physician/Extender: Rudene Re in Treatment: 30 Wound Status Wound Number: 2 Primary Venous Leg Ulcer Etiology: Wound Location: Right Lower Leg Wound Open Wounding Event: Gradually Appeared Status: Date Acquired: 12/31/2014 Comorbid Cataracts, Chronic Obstructive Weeks Of Treatment: 30 History: Pulmonary Disease (COPD), Sleep Clustered Wound: No Apnea, Angina, Congestive Heart Failure, Hypertension, Type II Diabetes, Gout, Osteoarthritis, Neuropathy Photos Photo Uploaded By: Alejandro Mulling on 08/01/2016 16:27:33 Wound Measurements Length:  (cm) 0.1 Width: (cm) 0.1 Depth: (cm) 0.1 Area: (cm) 0.008 Volume: (cm) 0.001 % Reduction in Area: 96.6% % Reduction in Volume: 95.8% Epithelialization: Medium (34-66%) Tunneling: No Undermining: No Wound Description Classification: Partial Thickness Foul Odor Af Diabetic Severity (Wagner): Grade 1 Wound Margin: Flat and Intact Exudate Amount: Small Exudate Type: Serous Exudate Color: amber ter Cleansing: No Wound Bed Granulation Amount: Large (67-100%) Exposed Structure Bove, Romin V. ([id number]) Granulation Quality: Red, Pink Fascia Exposed: No Necrotic Amount: Small (1-33%) Fat Layer Exposed: No Necrotic Quality: Adherent Slough Tendon Exposed: No Muscle Exposed: No Joint Exposed: No Bone Exposed: No Limited to Skin Breakdown Periwound Skin Texture Texture Color No Abnormalities Noted: No No Abnormalities Noted: No Localized Edema: Yes Erythema: Yes Erythema Location: Circumferential Moisture No Abnormalities Noted: No Temperature / Pain Maceration: No Temperature: No Abnormality Moist: Yes Tenderness on Palpation: Yes Wound Preparation Ulcer Cleansing: Other: soap and water, Topical Anesthetic Applied:  None Treatment Notes Wound #2 (Right Lower Leg) 1. Cleansed with: Cleanse wound with antibacterial soap and water 4. Dressing Applied: Aquacel Ag 7. Secured with Tape 3 Layer Compression System - Bilateral Notes unna to anchor Electronic Signature(s) Signed: 08/02/2016 5:24:41 PM By: Alejandro MullingPinkerton, Debra Entered By: Alejandro MullingPinkerton, Debra on 08/01/2016 12:55:56 Dinino, Cassell V. ([id number]) -------------------------------------------------------------------------------- Wound Assessment Details Patient Name: Ginette OttoLINDLEY, Nicandro V. Date of Service: 08/01/2016 12:45 PM Medical Record Number: [id number] Patient Account Number: [id number] Date of Birth/Sex: [birth year](70 y.o. Male) Treating RN: Phillis HaggisPinkerton, Debi Primary Care Physician: Oretha MilchSMITH, [name] Other  Clinician: Referring Physician: Oretha MilchSMITH, [name] Treating Physician/Extender: Rudene ReBritto, Errol Weeks in Treatment: 30 Wound Status Wound Number: 3 Primary Pressure Ulcer Etiology: Wound Location: Right Calcaneus Wound Open Wounding Event: Pressure Injury Status: Date Acquired: 10/31/2015 Comorbid Cataracts, Chronic Obstructive Weeks Of Treatment: 30 History: Pulmonary Disease (COPD), Sleep Clustered Wound: No Apnea, Angina, Congestive Heart Failure, Hypertension, Type II Diabetes, Gout, Osteoarthritis, Neuropathy Photos Photo Uploaded By: Alejandro MullingPinkerton, Debra on 08/01/2016 16:26:54 Wound Measurements Length: (cm) 1 Width: (cm) 2.2 Depth: (cm) 0.2 Area: (cm) 1.728 Volume: (cm) 0.346 % Reduction in Area: 75.6% % Reduction in Volume: 75.5% Epithelialization: Medium (34-66%) Tunneling: No Undermining: No Wound Description Classification: Category/Stage III Foul Odor Diabetic Severity (Wagner): Grade 1 Wound Margin: Flat and Intact Exudate Amount: Large Exudate Type: Serosanguineous Exudate Color: red, brown After Cleansing: No Wound Bed Granulation Amount: Medium (34-66%) Exposed Structure Talcott, Taiki V. ([id number]) Granulation Quality: Red, Pink Fascia Exposed: No Necrotic Amount: Medium (34-66%) Fat Layer Exposed: No Necrotic Quality: Adherent Slough Tendon Exposed: No Muscle Exposed: No Joint Exposed: No Bone Exposed: No Limited to Skin Breakdown Periwound Skin Texture Texture Color No Abnormalities Noted: No No Abnormalities Noted: No Localized Edema: Yes Erythema: Yes Erythema Location: Circumferential Moisture No Abnormalities Noted: No Temperature / Pain Maceration: Yes Temperature: No Abnormality Moist: Yes Tenderness on Palpation: Yes Wound Preparation Ulcer Cleansing: Other: soap and water, Topical Anesthetic Applied: Other: lidocaine 4%, Treatment Notes Wound #3 (Right Calcaneus) 1. Cleansed with: Cleanse wound with antibacterial soap and  water 2. Anesthetic Topical Lidocaine 4% cream to wound bed prior to debridement 3. Peri-wound Care: Barrier cream 4. Dressing Applied: Aquacel Ag 5. Secondary Dressing Applied ABD Pad Dry Gauze Kerlix/Conform 7. Secured with Tape Notes drawtex Electronic Signature(s) Signed: 08/02/2016 5:24:41 PM By: Alejandro MullingPinkerton, Debra Entered By: Alejandro MullingPinkerton, Debra on 08/01/2016 12:59:08 Ginette OttoLINDLEY, Carle V. ([id number]) -------------------------------------------------------------------------------- Wound Assessment Details Patient Name: Ginette OttoLINDLEY, Madhav V. Date of Service: 08/01/2016 12:45 PM Medical Record Number: [id number] Patient Account Number: [id number] Date of Birth/Sex: [birth year] [age] y.o. Male) Treating RN: Phillis HaggisPinkerton, Debi Primary Care Physician: Oretha MilchSMITH, [name] Other Clinician: Referring Physician: Oretha MilchSMITH, [name] Treating Physician/Extender: Rudene ReBritto, Errol Weeks in Treatment: 30 Wound Status Wound Number: 8 Primary Diabetic Wound/Ulcer of the Lower Etiology: Extremity Wound Location: Right Toe Third - Dorsal Secondary Trauma, Other Wounding Event: Trauma Etiology: Date Acquired: 07/17/2016 Wound Open Weeks Of Treatment: 2 Status: Clustered Wound: No Comorbid Cataracts, Chronic Obstructive History: Pulmonary Disease (COPD), Sleep Apnea, Angina, Congestive Heart Failure, Hypertension, Type II Diabetes, Gout, Osteoarthritis, Neuropathy Photos Photo Uploaded By: Alejandro MullingPinkerton, Debra on 08/01/2016 16:26:34 Wound Measurements Length: (cm) 0 % Reductio Width: (cm) 0 % Reductio Depth: (cm) 0 Epithelial Area: (cm) 0 Tunneling Volume: (cm) 0 Undermini n in Area: 100% n in Volume: 100% ization: Large (67-100%) : No ng: No Wound Description Classification: Grade 1 Wound Margin: Distinct, outline attached Exudate Amount: None Present Foul Odor After Cleansing: No Wound Bed Granulation Amount: None Present (0%) Exposed Structure  Necrotic Amount: None Present (0%) Fascia Exposed:  No Chopin, Rino V. ([id number]) Fat Layer Exposed: No Tendon Exposed: No Muscle Exposed: No Joint Exposed: No Bone Exposed: No Limited to Skin Breakdown Periwound Skin Texture Texture Color No Abnormalities Noted: No No Abnormalities Noted: No Moisture Temperature / Pain No Abnormalities Noted: No Temperature: No Abnormality Moist: No Tenderness on Palpation: Yes Wound Preparation Ulcer Cleansing: Other: soap and water, Topical Anesthetic Applied: None Electronic Signature(s) Signed: 08/02/2016 5:24:41 PM By: Alejandro Mulling Entered By: Alejandro Mulling on 08/01/2016 12:54:44 Kessner, Melik Seth Bake ([id number]) -------------------------------------------------------------------------------- Vitals Details Patient Name: [name]. Date of Service: 08/01/2016 12:45 PM Medical Record Number: [id number] Patient Account Number: [id number] Date of Birth/Sex: [birth year] (70 y.o. Male) Treating RN: Phillis Haggis Primary Care Physician: Oretha Milch Other Clinician: Referring Physician: Oretha Milch Treating Physician/Extender: Rudene Re in Treatment: 30 Vital Signs Time Taken: 12:37 Temperature (F): 98.3 Height (in): 70 Pulse (bpm): 55 Weight (lbs): 235 Respiratory Rate (breaths/min): 18 Body Mass Index (BMI): 33.7 Blood Pressure (mmHg): 132/54 Reference Range: 80 - 120 mg / dl Notes Made Dr. Meyer Russel aware of pts HR and diastolic BP. Electronic Signature(s) Signed: 08/02/2016 5:24:41 PM By: Alejandro Mulling Entered By: Alejandro Mulling on 08/01/2016 12:39:03

## 2016-08-08 ENCOUNTER — Encounter: Payer: Medicare Other | Admitting: Surgery

## 2016-08-08 DIAGNOSIS — E11621 Type 2 diabetes mellitus with foot ulcer: Secondary | ICD-10-CM | POA: Diagnosis not present

## 2016-08-09 NOTE — Progress Notes (Addendum)
[name], Linville V. ([id number]) Visit Report for 08/08/2016 Chief Complaint Document Details Patient Name: [name], [name] Date of Service: 08/08/2016 2:15 PM Medical Record Number: [id number] Patient Account Number: [id number] Date of Birth/Sex: 05-11-1946 10(70 y.o. Male) Treating RN: Phillis HaggisPinkerton, Debi Primary Care Physician: Oretha MilchSMITH, [name] Other Clinician: Referring Physician: Oretha MilchSMITH, [name] Treating Physician/Extender: Rudene ReBritto, Maddilyn Campus Weeks in Treatment: 31 Information Obtained from: Patient Chief Complaint Patient is at the clinic for treatment of an open pressure ulcer the left upper thigh and gluteal region and the right heel with bilateral swelling of the legs all of it which is going on for over a year Electronic Signature(s) Signed: 08/08/2016 3:06:03 PM By: Evlyn KannerBritto, Buel Molder MD, FACS Entered By: Evlyn KannerBritto, Shawnee Higham on 08/08/2016 15:06:03 [name], [name] ([id number]) -------------------------------------------------------------------------------- Debridement Details Patient Name: [name], Burleigh V. Date of Service: 08/08/2016 2:15 PM Medical Record Number: [id number] Patient Account Number: [id number] Date of Birth/Sex: 05-11-1946 [age] y.o. Male) Treating RN: Phillis HaggisPinkerton, Debi Primary Care Physician: Oretha MilchSMITH, [name] Other Clinician: Referring Physician: Oretha MilchSMITH, [name] Treating Physician/Extender: Rudene ReBritto, Stone Spirito Weeks in Treatment: 31 Debridement Performed for Wound #3 Right Calcaneus Assessment: Performed By: Physician Evlyn KannerBritto, Bolton Canupp, MD Debridement: Debridement Pre-procedure Yes - 15:00 Verification/Time Out Taken: Start Time: 15:01 Pain Control: Lidocaine 4% Topical Solution Level: Skin/Subcutaneous Tissue Total Area Debrided (L x 1.8 (cm) x 3 (cm) = 5.4 (cm) W): Tissue and other Viable, Non-Viable, Exudate, Fibrin/Slough, Subcutaneous material debrided: Instrument: Forceps, Scissors Bleeding: Minimum Hemostasis Achieved: Pressure End Time: 15:04 Procedural Pain: 0 Post Procedural Pain:  0 Response to Treatment: Procedure was tolerated well Post Debridement Measurements of Total Wound Length: (cm) 2.4 Stage: Category/Stage III Width: (cm) 3 Depth: (cm) 0.2 Volume: (cm) 1.131 Character of Wound/Ulcer Post Stable Debridement: Severity of Tissue Post Fat layer exposed Debridement: Post Procedure Diagnosis Same as Pre-procedure Electronic Signature(s) Signed: 08/08/2016 3:05:40 PM By: Evlyn KannerBritto, Anne-Marie Genson MD, FACS Signed: 08/08/2016 4:20:58 PM By: Alejandro MullingPinkerton, Debra Entered By: Evlyn KannerBritto, Trevian Hayashida on 08/08/2016 15:05:40 [name], [name] ([id number]) [name], [name] ([id number]) -------------------------------------------------------------------------------- HPI Details Patient Name: [name], Demauri V. Date of Service: 08/08/2016 2:15 PM Medical Record Number: [id number] Patient Account Number: [id number] Date of Birth/Sex: 05-11-1946 [age] y.o. Male) Treating RN: Phillis HaggisPinkerton, Debi Primary Care Physician: Oretha MilchSMITH, [name] Other Clinician: Referring Physician: Oretha MilchSMITH, [name] Treating Physician/Extender: Rudene ReBritto, Navin Dogan Weeks in Treatment: 31 History of Present Illness Location: ulcerated area on the right heel, left gluteal region and thigh and then bilateral lower extremities Quality: Patient reports experiencing a sharp pain to affected area(s). Severity: Patient states wound are getting worse. Duration: Patient has had the wound for > 12 months prior to seeking treatment at the wound center Timing: Pain in wound is constant (hurts all the time) Context: The wound appeared gradually over time Modifying Factors: Other treatment(s) tried include: he sees his heart doctor and his primary care doctor Associated Signs and Symptoms: patient has not been able to walk for over a year now HPI Description: [age] year old gentleman with a known history of hypertension, diabetes, obstructive sleep apnea, COPD, diastolic CHF, coronary artery disease was admitted to the hospital with sepsis from an ulcer of  the right heel and was treated there in October 2016. he also has chronic bilateral lower extremity edema and lymphedema. He had received vancomycin, Zosyn and at that stage and x-rays showed hardware in the right ankle but no evidence of osteomyelitis. he was a former smoker. he was also treated with Augmentin orally for 10 days. He is either bedbound or wheelchair-bound and does not ambulate by himself. 01/19/2016 --  he has not been seen here for 3 weeks and this was because he was admitted to [facility] between 223 and 01/08/2016 for sepsis, UTI and pneumonia involving the left lung. he was treated with IV vancomycin and Zosyn and then meropenem. Was discharged home on oral Bactrim for 2 weeks none of his vascular test or x-rays were done and we will reorder these. 01/26/2016 -- he has not yet done the x-ray of his foot and is vascular tests are still pending. I have asked him to work on these with his nursing home staff. Addendum: he has got an x-ray of the right foot done which shows that his osteopenia but no specific ostial lysis or abnormal periosteal reaction. Final impression was degenerative changes with dorsal foot soft tissue swelling. 02/16/2016 -- lower extremity arterial duplex examination shows a 50-99% stenosis of the right tibioperoneal trunk. He had biphasic flow in the right SFA, popliteal and tibioperoneal trunk. Left-sided he had triphasic flow throughout 02/29/2016 -- he is awaiting his vascular opinion with Dr. Gilda Crease which is to be done on April 24 03/08/2016 -- he has not kept his appointment with Dr. Gilda Crease on April 24 and does not seem to know what happened about this. it's difficult to gauge whether he is in full control of his mental faculties as at times he is extremely rude to the nursing staff. 03/22/2016 -- the patient was seen by Dr. 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 Recommended that the patient had severe atherosclerotic changes of both lower extremities associated with ulceration and tissue loss of the foot. This is a limb threatening ischemia and the patient was recommended to undergo Cicalese, Aaryav V. ([id number]) angiography of the lower extremity with a hope for intervention for limb salvage. Patient agreed and will proceed to angiography. He was admitted to the hospital between May 2 and 03/15/2016 - he underwent induction of a catheter into his right lower extremity and third order catheter placement with contrast injection to the right lower extremity for distal runoff. Percutaneous transluminal angioplasty of the right superficial femoral and popliteal arteries were done. He also had a right peroneal angioplasty. Patient had a postoperative hematoma and was admitted for observation and in the next 24 hours he was very agitated and combative and had to be seen by psychiatric. He had a follow-up with Dr. Gilda Crease in 3 weeks. 04/18/2016 -- notes reviewed from the vascular office where he was seen for his postop visit after the PTA of the right SFA and popliteal arteries on 03/12/2016. He underwent an ABI which showed right ABI to be more than 1.3 and left to be more than 1 more than 1.3, great toe and PPG waveforms are decreased bilaterally. No additional intervention indicated at this time and the patient was to follow-up in 3 months with an ABI and bilateral lower extremity duplex study. 05/02/2016 -- he complains that his compression stockings are causing him a lot of discomfort and he is not happy wearing them. 05/30/2016 -- the swelling of both legs has increased again and he has had blisters which have opened out into ulcerations. Electronic Signature(s) Signed: 08/08/2016 3:06:14 PM By: Evlyn Kanner MD, FACS Entered By: Evlyn Kanner on 08/08/2016 15:06:14 [name]  ([id number]) -------------------------------------------------------------------------------- Physical Exam Details Patient Name: [name] Date of Service: 08/08/2016 2:15 PM Medical Record Number: [id number] Patient Account Number:  [id number] Date of Birth/Sex: 02/23/1946 (70 y.o. Male) Treating RN: Ashok Cordia, Debi Primary Care Physician: Oretha Milch Other Clinician: Referring Physician: Oretha Milch Treating Physician/Extender: Rudene Re in Treatment: 31 Constitutional . Pulse regular. Respirations normal and unlabored. Afebrile. . Eyes Nonicteric. Reactive to light. Ears, Nose, Mouth, and Throat Lips, teeth, and gums WNL.Marland Kitchen Moist mucosa without lesions. Neck supple and nontender. No palpable supraclavicular or cervical adenopathy. Normal sized without goiter. Respiratory WNL. No retractions.. Cardiovascular Pedal Pulses WNL. No clubbing, cyanosis or edema. Lymphatic No adneopathy. No adenopathy. No adenopathy. Musculoskeletal Adexa without tenderness or enlargement.. Digits and nails w/o clubbing, cyanosis, infection, petechiae, ischemia, or inflammatory conditions.. Integumentary (Hair, Skin) No suspicious lesions. No crepitus or fluctuance. No peri-wound warmth or erythema. No masses.Marland Kitchen Psychiatric Judgement and insight Intact.. No evidence of depression, anxiety, or agitation.. Notes the lymphedema both lower extremities is much better but he's got some maceration of the skin near his calcaneus and I sharply debrided this with a forcep and scissors and the wound is slightly larger but has clean granulation tissue. Electronic Signature(s) Signed: 08/08/2016 3:07:34 PM By: Evlyn Kanner MD, FACS Entered By: Evlyn Kanner on 08/08/2016 15:07:34 [name] ([id number]) -------------------------------------------------------------------------------- Physician Orders Details Patient Name: [name] Date of Service: 08/08/2016 2:15 PM Medical  Record Number: [id number] Patient Account Number: [id number] Date of Birth/Sex: 1946-07-17 (70 y.o. Male) Treating RN: Phillis Haggis Primary Care Physician: Oretha Milch Other Clinician: Referring Physician: Oretha Milch Treating Physician/Extender: Rudene Re in Treatment: 67 Verbal / Phone Orders: Yes Clinician: Ashok Cordia, Debi Read Back and Verified: Yes Diagnosis Coding ICD-10 Coding Code Description E11.621 Type 2 diabetes mellitus with foot ulcer L89.613 Pressure ulcer of right heel, stage 3 E66.01 Morbid (severe) obesity due to excess calories I89.0 Lymphedema, not elsewhere classified M70.871 Other soft tissue disorders related to use, overuse and pressure, right ankle and foot 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 - only sink bath or bed bath Anesthetic Wound #3 Right Calcaneus o Topical Lidocaine 4% cream applied to wound bed prior to debridement - for clinic use only Skin Barriers/Peri-Wound Care Wound #3 Right Calcaneus o Barrier cream - around the heel wound to reddened and irritated areas Primary Wound Dressing Wound #3 Right Calcaneus o Aquacel Ag Secondary Dressing Wound #3 Right Calcaneus o Dry Gauze - Heel cups Dressing Change Frequency Wound #3 Right Calcaneus o Change dressing every day. - SN[facility] RN to change Follow-up Appointments [name], [name] ([id number]) Wound #3 Right Calcaneus o Return Appointment in 1 week. Edema Control o Patient to wear own compression stockings - Left leg o Other: - kerlix and coban to right leg Off-Loading Wound #3 Right Calcaneus o Turn and reposition every 2 hours o Other: - sage boots at night and float heel when lying in bed Additional Orders / Instructions Wound #3 Right Calcaneus o Increase protein intake. Medications-please add to medication list. Wound #3 Right  Calcaneus o Other: - Vitamin C, Vitamin A, Zinc, multivitamin Notes Do not touch compression wrap from the ankle up towards the calf on right leg. Will be changed next week during his wound care visit. Electronic Signature(s) Signed: 08/08/2016 5:32:18 PM By: Elpidio Eric [name], RN Signed: 08/09/2016 3:47:29 PM By: Evlyn Kanner MD, FACS Previous Signature: 08/08/2016 4:20:58 PM Version By: Alejandro Mulling Previous Signature: 08/08/2016 4:25:16 PM Version By: Evlyn Kanner MD, FACS Entered By: Elpidio Eric on 08/08/2016 17:13:24  AHMAAD, NEIDHARDT ([id number]) -------------------------------------------------------------------------------- Problem List Details Patient Name: [name], EDMONDS. Date of Service: 08/08/2016 2:15 PM Medical Record Number: [id number] Patient Account Number: [id number] Date of Birth/Sex: 12-10-1945 (70 y.o. Male) Treating RN: Phillis Haggis Primary Care Physician: Oretha Milch Other Clinician: Referring Physician: Oretha Milch Treating Physician/Extender: Rudene Re in Treatment: 60 Active Problems ICD-10 Encounter Code Description Active Date Diagnosis E11.621 Type 2 diabetes mellitus with foot ulcer 01/01/2016 Yes L89.613 Pressure ulcer of right heel, stage 3 01/01/2016 Yes E66.01 Morbid (severe) obesity due to excess calories 01/01/2016 Yes I89.0 Lymphedema, not elsewhere classified 01/01/2016 Yes M70.871 Other soft tissue disorders related to use, overuse and 01/19/2016 Yes pressure, right ankle and foot 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/16/2016 Yes ulceration of heel and midfoot Inactive Problems Resolved Problems ICD-10 Code Description Active Date Resolved Date L89.322 Pressure ulcer of left buttock, stage 2 01/01/2016 01/01/2016 Electronic Signature(s) Signed: 08/08/2016 3:05:27 PM By: Evlyn Kanner MD, FACS Entered By: Evlyn Kanner on 08/08/2016 15:05:26 [name] ([id number]) [name] Guy, Jermarcus VMarland Kitchen  ([id number]) -------------------------------------------------------------------------------- Progress Note Details Patient Name: [name] Date of Service: 08/08/2016 2:15 PM Medical Record Number: [id number] Patient Account Number: [id number] Date of Birth/Sex: 11-19-1945 (70 y.o. Male) Treating RN: Phillis Haggis Primary Care Physician: Oretha Milch Other Clinician: Referring Physician: Oretha Milch Treating Physician/Extender: Rudene Re in Treatment: 31 Subjective Chief Complaint Information obtained from Patient Patient is at the clinic for treatment of an open pressure ulcer the left upper thigh and gluteal region and the right heel with bilateral swelling of the legs all of it which is going on for over a year History of Present Illness (HPI) The following HPI elements were documented for the patient's wound: Location: ulcerated area on the right heel, left gluteal region and thigh and then bilateral lower extremities Quality: Patient reports experiencing a sharp pain to affected area(s). Severity: Patient states wound are getting worse. Duration: Patient has had the wound for > 12 months prior to seeking treatment at the wound center Timing: Pain in wound is constant (hurts all the time) Context: The wound appeared gradually over time Modifying Factors: Other treatment(s) tried include: he sees his heart doctor and his primary care doctor Associated Signs and Symptoms: patient has not been able to walk for over a year now 70 year old gentleman with a known history of hypertension, diabetes, obstructive sleep apnea, COPD, diastolic CHF, coronary artery disease was admitted to the hospital with sepsis from an ulcer of the right heel and was treated there in October 2016. he also has chronic bilateral lower extremity edema and lymphedema. He had received vancomycin, Zosyn and at that stage and x-rays showed hardware in the right ankle but no evidence of  osteomyelitis. he was a former smoker. he was also treated with Augmentin orally for 10 days. He is either bedbound or wheelchair-bound and does not ambulate by himself. 01/19/2016 -- he has not been seen here for 3 weeks and this was because he was admitted to [facility] between 223 and 01/08/2016 for sepsis, UTI and pneumonia involving the left lung. he was treated with IV vancomycin and Zosyn and then meropenem. Was discharged home on oral Bactrim for 2 weeks none of his vascular test or x-rays were done and we will reorder these. 01/26/2016 -- he has not yet done the x-ray of his foot and is vascular tests are still pending. I have asked him to work on these with his nursing home  staff. Addendum: he has got an x-ray of the right foot done which shows that his osteopenia but no specific ostial lysis or abnormal periosteal reaction. Final impression was degenerative changes with dorsal foot soft tissue swelling. 02/16/2016 -- lower extremity arterial duplex examination shows a 50-99% stenosis of the right tibioperoneal trunk. He had biphasic flow in the right SFA, popliteal and tibioperoneal trunk. Left-sided he had triphasic flow throughout [name], [name] ([id number]) 02/29/2016 -- he is awaiting his vascular opinion with Dr. Gilda Crease which is to be done on April 24 03/08/2016 -- he has not kept his appointment with Dr. Gilda Crease on April 24 and does not seem to know what happened about this. it's difficult to gauge whether he is in full control of his mental faculties as at times he is extremely rude to the nursing staff. 03/22/2016 -- the patient was seen by Dr. 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 Recommended that the patient had severe atherosclerotic changes of both lower extremities associated with ulceration and tissue loss of the foot. This  is a limb threatening ischemia and the patient was recommended to undergo angiography of the lower extremity with a hope for intervention for limb salvage. Patient agreed and will proceed to angiography. He was admitted to the hospital between May 2 and 03/15/2016 - he underwent induction of a catheter into his right lower extremity and third order catheter placement with contrast injection to the right lower extremity for distal runoff. Percutaneous transluminal angioplasty of the right superficial femoral and popliteal arteries were done. He also had a right peroneal angioplasty. Patient had a postoperative hematoma and was admitted for observation and in the next 24 hours he was very agitated and combative and had to be seen by psychiatric. He had a follow-up with Dr. Gilda Crease in 3 weeks. 04/18/2016 -- notes reviewed from the vascular office where he was seen for his postop visit after the PTA of the right SFA and popliteal arteries on 03/12/2016. He underwent an ABI which showed right ABI to be more than 1.3 and left to be more than 1 more than 1.3, great toe and PPG waveforms are decreased bilaterally. No additional intervention indicated at this time and the patient was to follow-up in 3 months with an ABI and bilateral lower extremity duplex study. 05/02/2016 -- he complains that his compression stockings are causing him a lot of discomfort and he is not happy wearing them. 05/30/2016 -- the swelling of both legs has increased again and he has had blisters which have opened out into ulcerations. Objective Constitutional Pulse regular. Respirations normal and unlabored. Afebrile. Vitals Time Taken: 2:33 PM, Height: 70 in, Weight: 235 lbs, BMI: 33.7, Temperature: 98.0 F, Pulse: 70 bpm, Respiratory Rate: 18 breaths/min, Blood Pressure: 127/76 mmHg. Eyes Nonicteric. Reactive to light. Ears, Nose, Mouth, and Throat Lips, teeth, and gums WNL.Marland Kitchen Moist mucosa without lesions. Neck [name],  [name] ([id number]) supple and nontender. No palpable supraclavicular or cervical adenopathy. Normal sized without goiter. Respiratory WNL. No retractions.. Cardiovascular Pedal Pulses WNL. No clubbing, cyanosis or edema. Lymphatic No adneopathy. No adenopathy. No adenopathy. Musculoskeletal Adexa without tenderness or enlargement.. Digits and nails w/o clubbing, cyanosis, infection, petechiae, ischemia, or inflammatory conditions.Marland Kitchen Psychiatric Judgement and insight Intact.. No evidence of depression, anxiety, or agitation.. General Notes: the lymphedema both lower extremities is much better but he's got some maceration of the skin near his  calcaneus and I sharply debrided this with a forcep and scissors and the wound is slightly larger but has clean granulation tissue. Integumentary (Hair, Skin) No suspicious lesions. No crepitus or fluctuance. No peri-wound warmth or erythema. No masses.. Wound #1 status is Healed - Epithelialized. Original cause of wound was Gradually Appeared. The wound is located on the Left Lower Leg. The wound measures 0cm length x 0cm width x 0cm depth; 0cm^2 area and 0cm^3 volume. The wound is limited to skin breakdown. There is no tunneling or undermining noted. There is a none present amount of drainage noted. The wound margin is flat and intact. There is no granulation within the wound bed. There is no necrotic tissue within the wound bed. The periwound skin appearance exhibited: Localized Edema, Moist, Erythema. The surrounding wound skin color is noted with erythema which is circumferential. Periwound temperature was noted as No Abnormality. The periwound has tenderness on palpation. Wound #2 status is Healed - Epithelialized. Original cause of wound was Gradually Appeared. The wound is located on the Right Lower Leg. The wound measures 0cm length x 0cm width x 0cm depth; 0cm^2 area and 0cm^3 volume. The wound is limited to skin breakdown. There is no  tunneling or undermining noted. There is a small amount of serous drainage noted. The wound margin is flat and intact. There is large (67- 100%) pink granulation within the wound bed. There is no necrotic tissue within the wound bed. The periwound skin appearance exhibited: Localized Edema, Moist, Erythema. The periwound skin appearance did not exhibit: Maceration. The surrounding wound skin color is noted with erythema which is circumferential. Periwound temperature was noted as No Abnormality. The periwound has tenderness on palpation. Wound #3 status is Open. Original cause of wound was Pressure Injury. The wound is located on the Right Calcaneus. The wound measures 1.8cm length x 3cm width x 0.2cm depth; 4.241cm^2 area and 0.848cm^3 volume. The wound is limited to skin breakdown. There is no tunneling or undermining noted. There is a large amount of serosanguineous drainage noted. The wound margin is flat and intact. There is medium (34-66%) red, pink granulation within the wound bed. There is a medium (34-66%) amount of necrotic tissue within the wound bed including Eschar and Adherent Slough. The periwound skin Berrios, Lenn V. ([id number]) appearance exhibited: Localized Edema, Maceration, Moist, Erythema. The surrounding wound skin color is noted with erythema which is circumferential. Periwound temperature was noted as No Abnormality. The periwound has tenderness on palpation. 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 There  was a Skin/Subcutaneous Tissue Debridement ([id number]) debridement with total area of 5.4 sq cm performed by Evlyn Kanner, MD. with the following instrument(s): Forceps and Scissors to remove Viable and Non-Viable tissue/material including Exudate, Fibrin/Slough, and Subcutaneous after achieving pain control using Lidocaine 4% Topical Solution. A time out was conducted at 15:00, prior to the start of the procedure. A Minimum amount of bleeding was controlled with Pressure. The procedure was tolerated well with a pain level of 0 throughout and a pain level of 0 following the procedure.  Post Debridement Measurements: 2.4cm length x 3cm width x 0.2cm depth; 1.131cm^3 volume. Post debridement Stage noted as Category/Stage III. Character of Wound/Ulcer Post Debridement is stable. Severity of Tissue Post Debridement is: Fat layer exposed. Post procedure Diagnosis Wound #3: Same as Pre-Procedure Plan Wound Cleansing: Wound #3 Right Calcaneus: No tub bath. - only sink bath or bed bath [name], [name] ([id number]) Anesthetic: Wound #3 Right Calcaneus: Topical Lidocaine 4% cream applied to wound bed prior to debridement - for clinic use only Skin Barriers/Peri-Wound Care: Wound #3 Right Calcaneus: Barrier cream - around the heel wound to reddened and irritated areas Primary Wound Dressing: Wound #3 Right Calcaneus: Aquacel Ag Secondary Dressing: Wound #3 Right Calcaneus: Dry Gauze - Heel cups Dressing Change Frequency: Wound #3 Right Calcaneus: Change dressing every day. - [facility] RN to change Follow-up Appointments: Wound #3 Right Calcaneus: Return Appointment in 1 week. Edema Control: Patient to wear own compression stockings - Left leg Other: - kerlix and coban to right leg Off-Loading: Wound #3 Right Calcaneus: Turn and reposition every 2 hours Other: - sage boots at night and float heel when lying in bed Additional Orders / Instructions: Wound #3 Right Calcaneus: Increase  protein intake. Medications-please add to medication list.: Wound #3 Right Calcaneus: Other: - Vitamin C, Vitamin A, Zinc, multivitamin General Notes: Do not touch compression wrap from the ankle up towards the calf on right leg. Will be changed next week during his wound care visit. I have recommended: 1. Aquacell Ag and then cover it with Drawtex to his right heel and may be change daily if needed 2. Constant off loading and also using Sage boot. 3. we will use 20-30 mmcompression stockings for both lower extremities. 4. to be very careful about his salt intake and have recommended a low-salt diet 5. High-protein diet and multivitamins including vitamin C and zinc. 6. benefit from lymphedema pumps to be used bilaterally and I have made my recommendations as above in my assessment. The vendor is awaiting delivery of this [name], [name] ([id number]) Electronic Signature(s) Signed: 08/09/2016 3:51:24 PM By: Evlyn Kanner MD, FACS Previous Signature: 08/08/2016 4:30:08 PM Version By: Evlyn Kanner MD, FACS Previous Signature: 08/08/2016 3:09:04 PM Version By: Evlyn Kanner MD, FACS Entered By: Evlyn Kanner on 08/09/2016 15:51:24 [name] ([id number]) -------------------------------------------------------------------------------- SuperBill Details Patient Name: [name] Date of Service: 08/08/2016 Medical Record Number: [id number] Patient Account Number: [id number] Date of Birth/Sex: 1946/10/29 (70 y.o. 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 Facility Procedures CPT4: Description Modifier Quantity Code [id number] 11042 - DEB SUBQ TISSUE 20 SQ CM/< 1 ICD-10 Description Diagnosis E11.621 Type 2 diabetes mellitus with foot ulcer L89.613 Pressure ulcer of right heel, stage 3 I89.0 Lymphedema, not elsewhere classified  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 ([id number]) Electronic Signature(s) Signed: 08/08/2016 3:09:18 PM By: Evlyn Kanner MD, FACS Entered By: Evlyn Kanner on 08/08/2016 15:09:18

## 2016-08-09 NOTE — Progress Notes (Signed)
REA, RESER ([id number]) Visit Report for 08/08/2016 Arrival Information Details Patient Name: [name], [name] Date of Service: 08/08/2016 2:15 PM Medical Record Number: [id number] Patient Account Number: [id number] Date of Birth/Sex: August 30, 1946 (70 y.o. Male) Treating RN: Phillis Haggis Primary Care Physician: Oretha Milch Other Clinician: Referring Physician: Oretha Milch Treating Physician/Extender: Rudene Re in Treatment: 31 Visit Information History Since Last Visit All ordered tests and consults were completed: No Patient Arrived: Wheel Chair Added or deleted any medications: No Arrival Time: 14:33 Any new allergies or adverse reactions: No Accompanied By: self Had a fall or experienced change in No Transfer Assistance: EasyPivot activities of daily living that may affect Patient Lift risk of falls: Patient Identification Verified: Yes Signs or symptoms of abuse/neglect since last No Secondary Verification Process Yes visito Completed: Hospitalized since last visit: No Patient Requires Transmission- No Pain Present Now: No Based Precautions: Patient Has Alerts: Yes Patient Alerts: DM II Electronic Signature(s) Signed: 08/08/2016 4:20:58 PM By: Alejandro Mulling Entered By: Alejandro Mulling on 08/08/2016 14:33:22 Barham, [name] ([id number]) -------------------------------------------------------------------------------- Encounter Discharge Information Details Patient Name: [name] Date of Service: 08/08/2016 2:15 PM Medical Record Number: [id number] Patient Account Number: [id number] Date of Birth/Sex: 07-20-1946 (70 y.o. Male) Treating RN: Phillis Haggis Primary Care Physician: Oretha Milch Other Clinician: Referring Physician: Oretha Milch Treating Physician/Extender: Rudene Re in Treatment: 43 Encounter Discharge Information Items Discharge Pain Level: 0 Discharge Condition: Stable Ambulatory Status: Wheelchair Discharge  Destination: Nursing Home Transportation: Other Accompanied By: self Schedule Follow-up Appointment: Yes Medication Reconciliation completed and provided to Patient/Care Yes Jihad Brownlow: Provided on Clinical Summary of Care: 08/08/2016 Form Type Recipient Paper Patient EL Electronic Signature(s) Signed: 08/08/2016 3:28:56 PM By: Gwenlyn Perking Entered By: Gwenlyn Perking on 08/08/2016 15:28:56 [name], [name] ([id number]) -------------------------------------------------------------------------------- Lower Extremity Assessment Details Patient Name: [name] Date of Service: 08/08/2016 2:15 PM Medical Record Number: [id number] Patient Account Number: [id number] Date of Birth/Sex: Feb 28, 1946 (70 y.o. Male) Treating RN: Phillis Haggis Primary Care Physician: Oretha Milch Other Clinician: Referring Physician: Oretha Milch Treating Physician/Extender: Rudene Re in Treatment: 31 Edema Assessment Assessed: [Left: No] [Right: No] E[Left: dema] [Right: :] Calf Left: Right: Point of Measurement: 34 cm From Medial Instep 36.1 cm 34 cm Ankle Left: Right: Point of Measurement: 12 cm From Medial Instep 27.7 cm 24.4 cm Vascular Assessment Pulses: Posterior Tibial Dorsalis Pedis Palpable: [Left:Yes] [Right:Yes] Extremity colors, hair growth, and conditions: Extremity Color: [Left:Hyperpigmented] [Right:Hyperpigmented] Temperature of Extremity: [Left:Warm] [Right:Warm] Capillary Refill: [Left:< 3 seconds] [Right:< 3 seconds] Toe Nail Assessment Left: Right: Thick: Yes Yes Discolored: Yes Yes Deformed: No No Improper Length and Hygiene: No No Electronic Signature(s) Signed: 08/08/2016 4:20:58 PM By: Alejandro Mulling Entered By: Alejandro Mulling on 08/08/2016 14:47:21 [name], [name] ([id number]) -------------------------------------------------------------------------------- Multi Wound Chart Details Patient Name: [name] Date of Service: 08/08/2016 2:15  PM Medical Record Number: [id number] Patient Account Number: [id number] Date of Birth/Sex: 06-22-1946 (70 y.o. Male) Treating RN: Phillis Haggis Primary Care Physician: Oretha Milch Other Clinician: Referring Physician: Oretha Milch Treating Physician/Extender: Rudene Re in Treatment: 31 Vital Signs Height(in): 70 Pulse(bpm): 70 Weight(lbs): 235 Blood Pressure 127/76 (mmHg): Body Mass Index(BMI): 34 Temperature(F): 98.0 Respiratory Rate 18 (breaths/min): Photos: [1:No Photos] [2:No Photos] [3:No Photos] Wound Location: [1:Left Lower Leg] [2:Right Lower Leg] [3:Right Calcaneus] Wounding Event: [1:Gradually Appeared] [2:Gradually Appeared] [3:Pressure Injury] Primary Etiology: [1:Venous Leg Ulcer] [2:Venous Leg Ulcer] [3:Pressure Ulcer] Comorbid History: [1:Cataracts, Chronic Obstructive Pulmonary Disease (COPD), Sleep Disease (COPD), Sleep Disease (COPD), Sleep  Apnea, Angina, Congestive Heart Failure, Congestive Heart Failure, Congestive Heart Failure, Hypertension, Type II Diabetes,  Gout, Osteoarthritis, Neuropathy Osteoarthritis, Neuropathy Osteoarthritis, Neuropathy] [2:Cataracts, Chronic Obstructive Pulmonary Apnea, Angina, Hypertension, Type II Diabetes, Gout,] [3:Cataracts, Chronic Obstructive Pulmonary Apnea, Angina,  Hypertension, Type II Diabetes, Gout,] Date Acquired: [1:12/31/2014] [2:12/31/2014] [3:10/31/2015] Weeks of Treatment: [1:31] [2:31] [3:31] Wound Status: [1:Open] [2:Open] [3:Open] Measurements L x W x D 0.1x0.1x0.1 [2:0.1x0.1x0.1] [3:1.8x3x0.2] (cm) Area (cm) : [1:0.008] [2:0.008] [3:4.241] Volume (cm) : [1:0.001] [2:0.001] [3:0.848] % Reduction in Area: [1:100.00%] [2:96.60%] [3:40.00%] % Reduction in Volume: 100.00% [2:95.80%] [3:40.00%] Classification: [1:Partial Thickness] [2:Partial Thickness] [3:Category/Stage III] HBO Classification: [1:Grade 1] [2:Grade 1] [3:Grade 1] Exudate Amount: [1:Small] [2:Small] [3:Large] Exudate Type: [1:Serous]  [2:Serous] [3:Serosanguineous] Exudate Color: [1:amber] [2:amber] [3:red, brown] Wound Margin: [1:Flat and Intact] [2:Flat and Intact] [3:Flat and Intact] Granulation Amount: [1:Large (67-100%)] [2:Large (67-100%)] [3:Medium (34-66%)] Granulation Quality: [1:Red, Pink] [2:Red, Pink] [3:Red, Pink] Necrotic Amount: [1:None Present (0%)] [2:None Present (0%)] [3:Medium (34-66%)] Necrotic Tissue: N/A N/A Eschar, Adherent Slough Exposed Structures: Fascia: No Fascia: No Fascia: No Fat: No Fat: No Fat: No Tendon: No Tendon: No Tendon: No Muscle: No Muscle: No Muscle: No Joint: No Joint: No Joint: No Bone: No Bone: No Bone: No Limited to Skin Limited to Skin Limited to Skin Breakdown Breakdown Breakdown Epithelialization: Large (67-100%) Large (67-100%) Medium (34-66%) Periwound Skin Texture: Edema: Yes Edema: Yes Edema: Yes Periwound Skin Moist: Yes Moist: Yes Maceration: Yes Moisture: Maceration: No Moist: Yes Periwound Skin Color: Erythema: Yes Erythema: Yes Erythema: Yes Erythema Location: Circumferential Circumferential Circumferential Temperature: No Abnormality No Abnormality No Abnormality Tenderness on Yes Yes Yes Palpation: Wound Preparation: Ulcer Cleansing: Other: Ulcer Cleansing: Other: Ulcer Cleansing: Other: soap and water soap and water soap and water Topical Anesthetic Topical Anesthetic Topical Anesthetic Applied: None Applied: None Applied: Other: lidocaine 4% Treatment Notes Electronic Signature(s) Signed: 08/08/2016 4:20:58 PM By: Alejandro Mulling Entered By: Alejandro Mulling on 08/08/2016 14:58:17 [name] ([id number]) -------------------------------------------------------------------------------- Multi-Disciplinary Care Plan Details Patient Name: [name] Date of Service: 08/08/2016 2:15 PM Medical Record Number: [id number] Patient Account Number: [id number] Date of Birth/Sex: 19-Nov-1945 (70 y.o. Male) Treating RN: Phillis Haggis Primary Care Physician: Oretha Milch Other Clinician: Referring Physician: Oretha Milch Treating Physician/Extender: Rudene Re in Treatment: 80 Active Inactive Abuse / Safety / Falls / Self Care Management Nursing Diagnoses: Potential for falls Goals: Patient will remain injury free Date Initiated: 03/01/2016 Goal Status: Active Interventions: Assess fall risk on admission and as needed Notes: Nutrition Nursing Diagnoses: Imbalanced nutrition Goals: Patient/caregiver agrees to and verbalizes understanding of need to use nutritional supplements and/or vitamins as prescribed Date Initiated: 03/01/2016 Goal Status: Active Interventions: Assess patient nutrition upon admission and as needed per policy Notes: Orientation to the Wound Care Program Nursing Diagnoses: Knowledge deficit related to the wound healing center program Goals: Patient/caregiver will verbalize understanding of the [facility]aling Center [address] [place], [place] ([id number]) Date Initiated: 03/01/2016 Goal Status: Active Interventions: Provide education on orientation to the wound center Notes: Pain, Acute or Chronic Nursing Diagnoses: Pain, acute or chronic: actual or potential Potential alteration in comfort, pain Goals: Patient will verbalize adequate pain control and receive pain control interventions during procedures as needed Date Initiated: 03/01/2016 Goal Status: Active Patient/caregiver will verbalize adequate pain control between visits Date Initiated: 03/01/2016 Goal Status: Active Interventions: Assess comfort goal upon admission Complete pain assessment as per visit requirements Notes: Pressure Nursing Diagnoses: Knowledge deficit related to causes and risk factors for pressure ulcer development  Knowledge deficit related to management of pressures ulcers Goals: Patient/caregiver will verbalize risk factors for pressure ulcer development Date Initiated: 03/01/2016 Goal  Status: Active Interventions: Assess offloading mechanisms upon admission and as needed Notes: Wound/Skin Impairment Nursing Diagnoses: [name], Harlyn V. ([id number]) Impaired tissue integrity Goals: Ulcer/skin breakdown will have a volume reduction of 30% by week 4 Date Initiated: 03/01/2016 Goal Status: Active Ulcer/skin breakdown will have a volume reduction of 50% by week 8 Date Initiated: 03/01/2016 Goal Status: Active Ulcer/skin breakdown will have a volume reduction of 80% by week 12 Date Initiated: 03/01/2016 Goal Status: Active Interventions: Assess ulceration(s) every visit Notes: Electronic Signature(s) Signed: 08/08/2016 4:20:58 PM By: Alejandro MullingPinkerton, Debra Entered By: Alejandro MullingPinkerton, Debra on 08/08/2016 14:58:09 [name], [name] ([id number]) -------------------------------------------------------------------------------- Pain Assessment Details Patient Name: [name], [name] Date of Service: 08/08/2016 2:15 PM Medical Record Number: [id number] Patient Account Number: [id number] Date of Birth/Sex: 18-Apr-1946 39(70 y.o. Male) Treating RN: Phillis HaggisPinkerton, Debi Primary Care Physician: Oretha MilchSMITH, [name] Other Clinician: Referring Physician: Oretha MilchSMITH, [name] Treating Physician/Extender: Rudene ReBritto, Errol Weeks in Treatment: 3831 Active Problems Location of Pain Severity and Description of Pain Patient Has Paino No Site Locations With Dressing Change: No Pain Management and Medication Current Pain Management: Electronic Signature(s) Signed: 08/08/2016 4:20:58 PM By: Alejandro MullingPinkerton, Debra Entered By: Alejandro MullingPinkerton, Debra on 08/08/2016 14:33:27 [name], [name] ([id number]) -------------------------------------------------------------------------------- Patient/Caregiver Education Details Patient Name: [name], [name] Date of Service: 08/08/2016 2:15 PM Medical Record Number: [id number] Patient Account Number: [id number] Date of Birth/Gender: 18-Apr-1946 [age] y.o. Male) Treating RN: Phillis HaggisPinkerton, Debi Primary Care  Physician: Oretha MilchSMITH, [name] Other Clinician: Referring Physician: Oretha MilchSMITH, [name] Treating Physician/Extender: Rudene ReBritto, Errol Weeks in Treatment: 7331 Education Assessment Education Provided To: Patient Education Topics Provided Wound/Skin Impairment: Handouts: Other: change dressing as ordered and do not get dressing wet Methods: Demonstration, Explain/Verbal Responses: State content correctly Electronic Signature(s) Signed: 08/08/2016 4:20:58 PM By: Alejandro MullingPinkerton, Debra Entered By: Alejandro MullingPinkerton, Debra on 08/08/2016 14:55:28 Leary, Arif V. ([id number]) -------------------------------------------------------------------------------- Wound Assessment Details Patient Name: [name], [name] Date of Service: 08/08/2016 2:15 PM Medical Record Number: [id number] Patient Account Number: [id number] Date of Birth/Sex: 18-Apr-1946 40(70 y.o. Male) Treating RN: Phillis HaggisPinkerton, Debi Primary Care Physician: Oretha MilchSMITH, [name] Other Clinician: Referring Physician: Oretha MilchSMITH, [name] Treating Physician/Extender: Rudene ReBritto, Errol Weeks in Treatment: 31 Wound Status Wound Number: 1 Primary Venous Leg Ulcer Etiology: Wound Location: Left Lower Leg Wound Healed - Epithelialized Wounding Event: Gradually Appeared Status: Date Acquired: 12/31/2014 Comorbid Cataracts, Chronic Obstructive Weeks Of Treatment: 31 History: Pulmonary Disease (COPD), Sleep Clustered Wound: No Apnea, Angina, Congestive Heart Failure, Hypertension, Type II Diabetes, Gout, Osteoarthritis, Neuropathy Photos Photo Uploaded By: Alejandro MullingPinkerton, Debra on 08/08/2016 16:18:43 Wound Measurements Length: (cm) 0 % Reduction Width: (cm) 0 % Reduction Depth: (cm) 0 Epitheliali Area: (cm) 0 Tunneling: Volume: (cm) 0 Underminin in Area: 100% in Volume: 100% zation: Large (67-100%) No g: No Wound Description Classification: Partial Thickness Foul Odor A Diabetic Severity (Wagner): Grade 1 Wound Margin: Flat and Intact Exudate Amount: None Present fter  Cleansing: No Wound Bed Granulation Amount: None Present (0%) Exposed Structure Necrotic Amount: None Present (0%) Fascia Exposed: No Fat Layer Exposed: No Rostro, [name] ([id number]) Tendon Exposed: No Muscle Exposed: No Joint Exposed: No Bone Exposed: No Limited to Skin Breakdown Periwound Skin Texture Texture Color No Abnormalities Noted: No No Abnormalities Noted: No Localized Edema: Yes Erythema: Yes Erythema Location: Circumferential Moisture No Abnormalities Noted: No Temperature / Pain Moist: Yes Temperature: No Abnormality Tenderness on Palpation: Yes Wound Preparation Ulcer Cleansing: Other: soap and water, Topical Anesthetic Applied:  None Electronic Signature(s) Signed: 08/08/2016 4:20:58 PM By: Alejandro Mulling Entered By: Alejandro Mulling on 08/08/2016 15:06:45 [name] ([id number]) -------------------------------------------------------------------------------- Wound Assessment Details Patient Name: [name] Date of Service: 08/08/2016 2:15 PM Medical Record Number: [id number] Patient Account Number: [id number] Date of Birth/Sex: 1946-07-08 (70 y.o. Male) Treating RN: Phillis Haggis Primary Care Physician: Oretha Milch Other Clinician: Referring Physician: Oretha Milch Treating Physician/Extender: Rudene Re in Treatment: 31 Wound Status Wound Number: 2 Primary Venous Leg Ulcer Etiology: Wound Location: Right Lower Leg Wound Healed - Epithelialized Wounding Event: Gradually Appeared Status: Date Acquired: 12/31/2014 Comorbid Cataracts, Chronic Obstructive Weeks Of Treatment: 31 History: Pulmonary Disease (COPD), Sleep Clustered Wound: No Apnea, Angina, Congestive Heart Failure, Hypertension, Type II Diabetes, Gout, Osteoarthritis, Neuropathy Photos Photo Uploaded By: Alejandro Mulling on 08/08/2016 16:18:44 Wound Measurements Length: (cm) 0 % Reduction Width: (cm) 0 % Reduction Depth: (cm) 0 Epitheliali Area: (cm)  0 Tunneling: Volume: (cm) 0 Underminin in Area: 100% in Volume: 100% zation: Large (67-100%) No g: No Wound Description Classification: Partial Thickness Foul Odor A Diabetic Severity (Wagner): Grade 1 Wound Margin: Flat and Intact Exudate Amount: Small Exudate Type: Serous Exudate Color: amber fter Cleansing: No Wound Bed Granulation Amount: Large (67-100%) Exposed Structure [name], [name] ([id number]) Granulation Quality: Pink Fascia Exposed: No Necrotic Amount: None Present (0%) Fat Layer Exposed: No Tendon Exposed: No Muscle Exposed: No Joint Exposed: No Bone Exposed: No Limited to Skin Breakdown Periwound Skin Texture Texture Color No Abnormalities Noted: No No Abnormalities Noted: No Localized Edema: Yes Erythema: Yes Erythema Location: Circumferential Moisture No Abnormalities Noted: No Temperature / Pain Maceration: No Temperature: No Abnormality Moist: Yes Tenderness on Palpation: Yes Wound Preparation Ulcer Cleansing: Other: soap and water, Topical Anesthetic Applied: None Electronic Signature(s) Signed: 08/08/2016 4:20:58 PM By: Alejandro Mulling Entered By: Alejandro Mulling on 08/08/2016 15:19:25 [name], Roshawn VMarland Kitchen ([id number]) -------------------------------------------------------------------------------- Wound Assessment Details Patient Name: [name] Date of Service: 08/08/2016 2:15 PM Medical Record Number: [id number] Patient Account Number: [id number] Date of Birth/Sex: [birth year] (70 y.o. Male) Treating RN: Phillis Haggis Primary Care Physician: Oretha Milch Other Clinician: Referring Physician: Oretha Milch Treating Physician/Extender: Rudene Re in Treatment: 31 Wound Status Wound Number: 3 Primary Pressure Ulcer Etiology: Wound Location: Right Calcaneus Wound Open Wounding Event: Pressure Injury Status: Date Acquired: 10/31/2015 Comorbid Cataracts, Chronic Obstructive Weeks Of Treatment: 31 History: Pulmonary  Disease (COPD), Sleep Clustered Wound: No Apnea, Angina, Congestive Heart Failure, Hypertension, Type II Diabetes, Gout, Osteoarthritis, Neuropathy Photos Photo Uploaded By: Alejandro Mulling on 08/08/2016 16:19:24 Wound Measurements Length: (cm) 1.8 Width: (cm) 3 Depth: (cm) 0.2 Area: (cm) 4.241 Volume: (cm) 0.848 % Reduction in Area: 40% % Reduction in Volume: 40% Epithelialization: Medium (34-66%) Tunneling: No Undermining: No Wound Description Classification: Category/Stage III Diabetic Severity [address]): Grade 1 Wound Margin: Flat and Intact Exudate Amount: Large Exudate Type: Serosanguineous Exudate Color: red, brown Foul Odor After Cleansing: No Wound Bed Granulation Amount: Medium (34-66%) Exposed Structure Chern, Orien V. ([id number]) Granulation Quality: Red, Pink Fascia Exposed: No Necrotic Amount: Medium (34-66%) Fat Layer Exposed: No Necrotic Quality: Eschar, Adherent Slough Tendon Exposed: No Muscle Exposed: No Joint Exposed: No Bone Exposed: No Limited to Skin Breakdown Periwound Skin Texture Texture Color No Abnormalities Noted: No No Abnormalities Noted: No Localized Edema: Yes Erythema: Yes Erythema Location: Circumferential Moisture No Abnormalities Noted: No Temperature / Pain Maceration: Yes Temperature: No Abnormality Moist: Yes Tenderness on Palpation: Yes Wound Preparation Ulcer Cleansing: Other: soap and water, Topical Anesthetic Applied: Other: lidocaine 4%,  Treatment Notes Wound #3 (Right Calcaneus) 1. Cleansed with: Cleanse wound with antibacterial soap and water 2. Anesthetic Topical Lidocaine 4% cream to wound bed prior to debridement 3. Peri-wound Care: Skin Prep 4. Dressing Applied: Aquacel Ag 5. Secondary Dressing Applied Dry Gauze Kerlix/Conform 7. Secured with Tape Notes heel cup Electronic Signature(s) Signed: 08/08/2016 4:20:58 PM By: Alejandro Mulling Entered By: Alejandro Mulling on 08/08/2016  14:48:58 [name] ([id number]) -------------------------------------------------------------------------------- Vitals Details Patient Name: [name] Date of Service: 08/08/2016 2:15 PM Medical Record Number: [id number] Patient Account Number: [id number] Date of Birth/Sex: 1946-02-15 (70 y.o. Male) Treating RN: Phillis Haggis Primary Care Physician: Oretha Milch Other Clinician: Referring Physician: Oretha Milch Treating Physician/Extender: Rudene Re in Treatment: 31 Vital Signs Time Taken: 14:33 Temperature (F): 98.0 Height (in): 70 Pulse (bpm): 70 Weight (lbs): 235 Respiratory Rate (breaths/min): 18 Body Mass Index (BMI): 33.7 Blood Pressure (mmHg): 127/76 Reference Range: 80 - 120 mg / dl Electronic Signature(s) Signed: 08/08/2016 4:20:58 PM By: Alejandro Mulling Entered By: Alejandro Mulling on 08/08/2016 14:34:23

## 2016-08-15 ENCOUNTER — Encounter: Payer: Medicare Other | Attending: General Surgery | Admitting: General Surgery

## 2016-08-15 DIAGNOSIS — E11621 Type 2 diabetes mellitus with foot ulcer: Secondary | ICD-10-CM | POA: Diagnosis not present

## 2016-08-15 DIAGNOSIS — I5032 Chronic diastolic (congestive) heart failure: Secondary | ICD-10-CM | POA: Insufficient documentation

## 2016-08-15 DIAGNOSIS — I110 Hypertensive heart disease with heart failure: Secondary | ICD-10-CM | POA: Insufficient documentation

## 2016-08-15 DIAGNOSIS — I890 Lymphedema, not elsewhere classified: Secondary | ICD-10-CM | POA: Diagnosis not present

## 2016-08-15 DIAGNOSIS — I2510 Atherosclerotic heart disease of native coronary artery without angina pectoris: Secondary | ICD-10-CM | POA: Insufficient documentation

## 2016-08-15 DIAGNOSIS — L89613 Pressure ulcer of right heel, stage 3: Secondary | ICD-10-CM | POA: Insufficient documentation

## 2016-08-15 DIAGNOSIS — G4733 Obstructive sleep apnea (adult) (pediatric): Secondary | ICD-10-CM | POA: Diagnosis not present

## 2016-08-15 DIAGNOSIS — Z6833 Body mass index (BMI) 33.0-33.9, adult: Secondary | ICD-10-CM | POA: Insufficient documentation

## 2016-08-15 DIAGNOSIS — L89511 Pressure ulcer of right ankle, stage 1: Secondary | ICD-10-CM

## 2016-08-15 DIAGNOSIS — Z87891 Personal history of nicotine dependence: Secondary | ICD-10-CM | POA: Diagnosis not present

## 2016-08-15 DIAGNOSIS — I70234 Atherosclerosis of native arteries of right leg with ulceration of heel and midfoot: Secondary | ICD-10-CM | POA: Insufficient documentation

## 2016-08-15 DIAGNOSIS — J449 Chronic obstructive pulmonary disease, unspecified: Secondary | ICD-10-CM | POA: Diagnosis not present

## 2016-08-15 DIAGNOSIS — M70871 Other soft tissue disorders related to use, overuse and pressure, right ankle and foot: Secondary | ICD-10-CM | POA: Diagnosis not present

## 2016-08-15 NOTE — Progress Notes (Signed)
See I heal 

## 2016-08-15 NOTE — Progress Notes (Addendum)
[name], [name] ([id number]) Visit Report for 08/15/2016 Chief Complaint Document Details Patient Name: [name], [name] Date of Service: 08/15/2016 8:30 AM Medical Record Number: [id number] Patient Account Number: [id number] Date of Birth/Sex: Jul 25, 1946 (70 y.o. Male) Treating RN: Phillis Haggis Primary Care Physician: Oretha Milch Other Clinician: Referring Physician: Oretha Milch Treating Physician/Extender: Elayne Snare in Treatment: 15 Information Obtained from: Patient Chief Complaint Patient is at the clinic for treatment of an open pressure ulcer the left upper thigh and gluteal region and the right heel with bilateral swelling of the legs all of it which is going on for over a year Electronic Signature(s) Signed: 08/15/2016 9:37:14 AM By: Ardath Sax MD Entered By: Ardath Sax on 08/15/2016 09:37:14 [name] ([id number]) -------------------------------------------------------------------------------- HPI Details Patient Name: [name] Date of Service: 08/15/2016 8:30 AM Medical Record Number: [id number] Patient Account Number: [id number] Date of Birth/Sex: 1946-04-04 (70 y.o. Male) Treating RN: Phillis Haggis Primary Care Physician: Oretha Milch Other Clinician: Referring Physician: Oretha Milch Treating Physician/Extender: Elayne Snare in Treatment: 32 History of Present Illness Location: ulcerated area on the right heel, left gluteal region and thigh and then bilateral lower extremities Quality: Patient reports experiencing a sharp pain to affected area(s). Severity: Patient states wound are getting worse. Duration: Patient has had the wound for > 12 months prior to seeking treatment at the wound center Timing: Pain in wound is constant (hurts all the time) Context: The wound appeared gradually over time Modifying Factors: Other treatment(s) tried include: he sees his heart doctor and his primary care doctor Associated Signs and  Symptoms: patient has not been able to walk for over a year now HPI Description: 70 year old gentleman with a known history of hypertension, diabetes, obstructive sleep apnea, COPD, diastolic CHF, coronary artery disease was admitted to the hospital with sepsis from an ulcer of the right heel and was treated there in October 2016. he also has chronic bilateral lower extremity edema and lymphedema. He had received vancomycin, Zosyn and at that stage and x-rays showed hardware in the right ankle but no evidence of osteomyelitis. he was a former smoker. he was also treated with Augmentin orally for 10 days. He is either bedbound or wheelchair-bound and does not ambulate by himself. 01/19/2016 -- he has not been seen here for 3 weeks and this was because he was admitted to [facility] between 223 and 01/08/2016 for sepsis, UTI and pneumonia involving the left lung. he was treated with IV vancomycin and Zosyn and then meropenem. Was discharged home on oral Bactrim for 2 weeks none of his vascular test or x-rays were done and we will reorder these. 01/26/2016 -- he has not yet done the x-ray of his foot and is vascular tests are still pending. I have asked him to work on these with his nursing home staff. Addendum: he has got an x-ray of the right foot done which shows that his osteopenia but no specific ostial lysis or abnormal periosteal reaction. Final impression was degenerative changes with dorsal foot soft tissue swelling. 02/16/2016 -- lower extremity arterial duplex examination shows a 50-99% stenosis of the right tibioperoneal trunk. He had biphasic flow in the right SFA, popliteal and tibioperoneal trunk. Left-sided he had triphasic flow throughout 02/29/2016 -- he is awaiting his vascular opinion with Dr. Gilda Crease which is to be done on April 24 03/08/2016 -- he has not kept his appointment with Dr. Gilda Crease on April 24 and does not seem to know what happened about  this. it's difficult to gauge whether he is in full control of his mental faculties as at times he is extremely rude to the nursing staff. 03/22/2016 -- the patient was seen by Dr. 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 Recommended that the patient had severe atherosclerotic changes of both lower extremities associated with ulceration and tissue loss of the foot. This is a limb threatening ischemia and the patient was recommended to undergo [name], [name] ([id number]) angiography of the lower extremity with a hope for intervention for limb salvage. Patient agreed and will proceed to angiography. He was admitted to the hospital between May 2 and 03/15/2016 - he underwent induction of a catheter into his right lower extremity and third order catheter placement with contrast injection to the right lower extremity for distal runoff. Percutaneous transluminal angioplasty of the right superficial femoral and popliteal arteries were done. He also had a right peroneal angioplasty. Patient had a postoperative hematoma and was admitted for observation and in the next 24 hours he was very agitated and combative and had to be seen by psychiatric. He had a follow-up with Dr. Gilda Crease in 3 weeks. 04/18/2016 -- notes reviewed from the vascular office where he was seen for his postop visit after the PTA of the right SFA and popliteal arteries on 03/12/2016. He underwent an ABI which showed right ABI to be more than 1.3 and left to be more than 1 more than 1.3, great toe and PPG waveforms are decreased bilaterally. No additional intervention indicated at this time and the patient was to follow-up in 3 months with an ABI and bilateral lower extremity duplex study. 05/02/2016 -- he complains that his compression stockings are causing him a lot of discomfort and he is not happy wearing  them. 05/30/2016 -- the swelling of both legs has increased again and he has had blisters which have opened out into ulcerations. Electronic Signature(s) Signed: 08/15/2016 9:39:20 AM By: Ardath Sax MD Entered By: Ardath Sax on 08/15/2016 09:39:19 [name] ([id number]) -------------------------------------------------------------------------------- Physical Exam Details Patient Name: [name] Date of Service: 08/15/2016 8:30 AM Medical Record Number: [id number] Patient Account Number: [id number] Date of Birth/Sex: 1946/03/30 (70 y.o. Male) Treating RN: Phillis Haggis Primary Care Physician: Oretha Milch Other Clinician: Referring Physician: Oretha Milch Treating Physician/Extender: Elayne Snare in Treatment: 32 Electronic Signature(s) Signed: 08/15/2016 9:39:35 AM By: Ardath Sax MD Entered By: Ardath Sax on 08/15/2016 09:39:34 [name] ([id number]) -------------------------------------------------------------------------------- Physician Orders Details Patient Name: [name] Date of Service: 08/15/2016 8:30 AM Medical Record Number: [id number] Patient Account Number: [id number] Date of Birth/Sex: 01-19-1946 (70 y.o. Male) Treating RN: Huel Coventry Primary Care Physician: Oretha Milch Other Clinician: Referring Physician: Oretha Milch Treating Physician/Extender: Elayne Snare in Treatment: 30 Verbal / Phone Orders: Yes Clinician: Huel Coventry Read Back and Verified: Yes Diagnosis Coding ICD-10 Coding Code Description E11.621 Type 2 diabetes mellitus with foot ulcer L89.613 Pressure ulcer of right heel, stage 3 E66.01 Morbid (severe) obesity due to excess calories I89.0 Lymphedema, not elsewhere classified M70.871 Other soft tissue disorders related to use, overuse and pressure, right ankle and foot 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 - only sink bath or bed bath Wound #9 Left,Anterior Lower Leg o No tub bath. - only sink bath or  bed bath Anesthetic Wound #3 Right Calcaneus o Topical Lidocaine 4% cream applied to wound bed prior to debridement - for clinic use only Wound #9 Left,Anterior Lower Leg o Topical Lidocaine 4% cream applied to wound bed prior to debridement - for clinic use only Skin Barriers/Peri-Wound Care Wound #3 Right Calcaneus o Barrier cream - around the heel wound to reddened and irritated areas Wound #9 Left,Anterior Lower Leg o Barrier cream - around the heel wound to reddened and irritated areas Primary Wound Dressing Wound #3 Right Calcaneus o Aquacel Ag Wound #9 Left,Anterior Lower Leg Mini, [name] ([id number]) o Aquacel Ag Secondary Dressing Wound #3 Right Calcaneus o Dry Gauze - Heel cups Dressing Change Frequency Wound #3 Right Calcaneus o Change dressing every other day. Wound #9 Left,Anterior Lower Leg o Other: - Tuesday, Thursday and Sat Follow-up Appointments Wound #3 Right Calcaneus o Return Appointment in 1 week. Edema Control o Other: - Bilateral kerlix and coban Off-Loading Wound #3 Right Calcaneus o Turn and reposition every 2 hours o Other: - sage boots at night and float heel when lying in bed Additional Orders / Instructions Wound #3 Right Calcaneus o Increase protein intake. Medications-please add to medication list. Wound #3 Right Calcaneus o Other: - Vitamin C, Vitamin A, Zinc, multivitamin Electronic Signature(s) Signed: 08/15/2016 3:59:06 PM By: Ardath Sax MD Signed: 08/15/2016 4:28:52 PM By: Elliot Gurney RN, [name], Kim RN, [name] Entered By: Elliot Gurney, RN, [name], Kim on 08/15/2016 09:40:26 [name] ([id number]) -------------------------------------------------------------------------------- Problem List Details Patient Name: [name] Date of Service: 08/15/2016 8:30 AM Medical Record  Number: [id number] Patient Account Number: [id number] Date of Birth/Sex: 1946/05/24 (70 y.o. Male) Treating RN: Phillis Haggis Primary Care Physician: Oretha Milch Other Clinician: Referring Physician: Oretha Milch Treating Physician/Extender: Elayne Snare in Treatment: 63 Active Problems ICD-10 Encounter Code Description Active Date Diagnosis E11.621 Type 2 diabetes mellitus with foot ulcer 01/01/2016 Yes L89.613 Pressure ulcer of right heel, stage 3 01/01/2016 Yes E66.01 Morbid (severe) obesity due to excess calories 01/01/2016 Yes I89.0 Lymphedema, not elsewhere classified 01/01/2016 Yes M70.871 Other soft tissue disorders related to use, overuse and 01/19/2016 Yes pressure, right ankle and foot 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/16/2016 Yes ulceration of heel and midfoot Inactive Problems Resolved Problems ICD-10 Code Description Active Date Resolved Date L89.322 Pressure ulcer of left buttock, stage 2 01/01/2016 01/01/2016 Electronic Signature(s) Signed: 08/15/2016 9:36:59 AM By: Ardath Sax MD Entered By: Ardath Sax on 08/15/2016 09:36:58 [name] ([id number]) [name], [name] VMarland Kitchen ([id number]) -------------------------------------------------------------------------------- Progress Note Details Patient Name: [name] Date of Service: 08/15/2016 8:30 AM Medical Record Number: [id number] Patient Account Number: [id number] Date of Birth/Sex: [birth year] (70 y.o. Male) Treating RN: Phillis Haggis Primary Care Physician: Oretha Milch Other Clinician: Referring Physician: Oretha Milch Treating Physician/Extender: Elayne Snare in Treatment: 28 Subjective Chief Complaint Information obtained from Patient Patient is at the clinic for treatment of an open pressure ulcer the left upper thigh and gluteal region and the right heel with bilateral swelling of the legs all of it which is going on for over a year History of Present  Illness (HPI) The following HPI elements were documented for the patient's wound: Location: ulcerated area on the right heel, left gluteal region and thigh and then bilateral lower extremities Quality: Patient reports experiencing a sharp pain to affected area(s). Severity: Patient states wound are getting worse. Duration: Patient has had the wound for > 12 months prior to seeking treatment at the wound  center Timing: Pain in wound is constant (hurts all the time) Context: The wound appeared gradually over time Modifying Factors: Other treatment(s) tried include: he sees his heart doctor and his primary care doctor Associated Signs and Symptoms: patient has not been able to walk for over a year now 70 year old gentleman with a known history of hypertension, diabetes, obstructive sleep apnea, COPD, diastolic CHF, coronary artery disease was admitted to the hospital with sepsis from an ulcer of the right heel and was treated there in October 2016. he also has chronic bilateral lower extremity edema and lymphedema. He had received vancomycin, Zosyn and at that stage and x-rays showed hardware in the right ankle but no evidence of osteomyelitis. he was a former smoker. he was also treated with Augmentin orally for 10 days. He is either bedbound or wheelchair-bound and does not ambulate by himself. 01/19/2016 -- he has not been seen here for 3 weeks and this was because he was admitted to [facility] between 223 and 01/08/2016 for sepsis, UTI and pneumonia involving the left lung. he was treated with IV vancomycin and Zosyn and then meropenem. Was discharged home on oral Bactrim for 2 weeks none of his vascular test or x-rays were done and we will reorder these. 01/26/2016 -- he has not yet done the x-ray of his foot and is vascular tests are still pending. I have asked him to work on these with his nursing home staff. Addendum: he has got an x-ray of the right foot done which  shows that his osteopenia but no specific ostial lysis or abnormal periosteal reaction. Final impression was degenerative changes with dorsal foot soft tissue swelling. 02/16/2016 -- lower extremity arterial duplex examination shows a 50-99% stenosis of the right tibioperoneal trunk. He had biphasic flow in the right SFA, popliteal and tibioperoneal trunk. Left-sided he had triphasic flow throughout [name], [name] ([id number]) 02/29/2016 -- he is awaiting his vascular opinion with Dr. Gilda Crease which is to be done on April 24 03/08/2016 -- he has not kept his appointment with Dr. Gilda Crease on April 24 and does not seem to know what happened about this. it's difficult to gauge whether he is in full control of his mental faculties as at times he is extremely rude to the nursing staff. 03/22/2016 -- the patient was seen by Dr. 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 Recommended that the patient had severe atherosclerotic changes of both lower extremities associated with ulceration and tissue loss of the foot. This is a limb threatening ischemia and the patient was recommended to undergo angiography of the lower extremity with a hope for intervention for limb salvage. Patient agreed and will proceed to angiography. He was admitted to the hospital between May 2 and 03/15/2016 - he underwent induction of a catheter into his right lower extremity and third order catheter placement with contrast injection to the right lower extremity for distal runoff. Percutaneous transluminal angioplasty of the right superficial femoral and popliteal arteries were done. He also had a right peroneal angioplasty. Patient had a postoperative hematoma and was admitted for observation and in the next 24 hours he was very agitated and combative and had to be seen by psychiatric. He had a follow-up with Dr.  Gilda Crease in 3 weeks. 04/18/2016 -- notes reviewed from the vascular office where he was seen for his postop visit after the PTA of the  right SFA and popliteal arteries on 03/12/2016. He underwent an ABI which showed right ABI to be more than 1.3 and left to be more than 1 more than 1.3, great toe and PPG waveforms are decreased bilaterally. No additional intervention indicated at this time and the patient was to follow-up in 3 months with an ABI and bilateral lower extremity duplex study. 05/02/2016 -- he complains that his compression stockings are causing him a lot of discomfort and he is not happy wearing them. 05/30/2016 -- the swelling of both legs has increased again and he has had blisters which have opened out into ulcerations. Objective Constitutional Vitals Time Taken: 8:46 AM, Height: 70 in, Weight: 235 lbs, BMI: 33.7, Temperature: 98.0 F, Pulse: 78 bpm, Respiratory Rate: 18 breaths/min, Blood Pressure: 127/71 mmHg. Integumentary (Hair, Skin) Wound #3 status is Open. Original cause of wound was Pressure Injury. The wound is located on the Right Calcaneus. The wound measures 1.2cm length x 3cm width x 0.1cm depth; 2.827cm^2 area and 0.283cm^3 volume. The wound is limited to skin breakdown. There is no tunneling or undermining noted. There is a large amount of serosanguineous drainage noted. The wound margin is flat and intact. There is medium (34-66%) red, pink granulation within the wound bed. There is a small (1-33%) amount of necrotic tissue within the wound bed including Adherent Slough. The periwound skin appearance exhibited: Localized Edema, Moist, Erythema. The surrounding wound skin color is noted with erythema which is circumferential. Periwound temperature was noted as No Abnormality. The periwound has tenderness on [name], [name] ([id number]) palpation. Wound #9 status is Open. Original cause of wound was Gradually Appeared. The wound is located on the Left,Anterior  Lower Leg. The wound measures 15cm length x 8.5cm width x 0.1cm depth; 100.138cm^2 area and 10.014cm^3 volume. The wound is limited to skin breakdown. There is a large amount of sanguinous drainage noted. The wound margin is flat and intact. There is medium (34-66%) red, friable granulation within the wound bed. There is a medium (34-66%) amount of necrotic tissue within the wound bed including Adherent Slough. The periwound skin appearance exhibited: Induration, Moist, Hemosiderin Staining. The periwound skin appearance did not exhibit: Callus, Crepitus, Excoriation, Fluctuance, Friable, Localized Edema, Rash, Scarring, Dry/Scaly, Maceration, Atrophie Blanche, Cyanosis, Ecchymosis, Mottled, Pallor, Rubor, Erythema. 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 Has pressure ulcer rightheel. Also as a result of UNNA boot to left leg as ulcers left calf. Will Rx withlight compression wrap and aquacel AG. Electronic Signature(s) Signed: 08/15/2016 9:42:47 AM By: Ardath Sax MD Entered By: Ardath Sax on 08/15/2016 09:42:46 [name] ([id number]) -------------------------------------------------------------------------------- SuperBill Details Patient Name: [name] Date of Service: 08/15/2016 Medical Record Number: [id number] Patient Account Number: [id number] Date of Birth/Sex: [birth year] (70 y.o. 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 Facility Procedures CPT4 Code: [id number] Description: 99213 - WOUND CARE VISIT-LEV 3 EST PT Modifier: Quantity: 1 Physician Procedures CPT4: Description Modifier Quantity Code [id number] 99212 - WC PHYS LEVEL 2 - EST PT 1 ICD-10 Description Diagnosis L89.613 Pressure ulcer of right heel, stage 3 I89.0 Lymphedema, not elsewhere classified I70.234 Atherosclerosis of native arteries of right  leg with ulceration of heel and midfoot Electronic Signature(s) Signed: 08/15/2016 3:59:06 PM By: Ardath SaxParker, Dante Roudebush MD Signed: 08/15/2016 4:28:52 PM By: Elliot GurneyWoody, RN, [name], Kim RN, [name] Previous Signature: 08/15/2016 9:43:28 AM Version By: Ardath SaxParker, Floraine Buechler MD Entered By: Elliot GurneyWoody, RN, [name], Kim on 08/15/2016 09:45:18

## 2016-08-16 NOTE — Progress Notes (Signed)
JADIN, CREQUE ([id number]) Visit Report for 08/15/2016 Arrival Information Details Patient Name: [name], [name] Date of Service: 08/15/2016 8:30 AM Medical Record Number: [id number] Patient Account Number: [id number] Date of Birth/Sex: [birth year] (70 y.o. Male) Treating RN: Huel Coventry Primary Care Physician: Oretha Milch Other Clinician: Referring Physician: Oretha Milch Treating Physician/Extender: Elayne Snare in Treatment: 32 Visit Information History Since Last Visit Added or deleted any medications: No Patient Arrived: Wheel Chair Any new allergies or adverse reactions: No Arrival Time: 08:45 Had a fall or experienced change in No activities of daily living that may affect Accompanied By: self risk of falls: Transfer Assistance: Manual Signs or symptoms of abuse/neglect since last No Patient Identification Verified: Yes visito Secondary Verification Process Yes Has Dressing in Place as Prescribed: Yes Completed: Has Compression in Place as Prescribed: Yes Patient Requires Transmission-Based No Pain Present Now: No Precautions: Patient Has Alerts: Yes Patient Alerts: DM II Electronic Signature(s) Signed: 08/15/2016 4:28:52 PM By: Elliot Gurney, RN, [name], Kim RN, [name] Entered By: Elliot Gurney, RN, [name], Kim on 08/15/2016 08:45:54 [name] ([id number]) -------------------------------------------------------------------------------- Clinic Level of Care Assessment Details Patient Name: [name] Date of Service: 08/15/2016 8:30 AM Medical Record Number: [id number] Patient Account Number: [id number] Date of Birth/Sex: [birth year] (70 y.o. Male) Treating RN: Huel Coventry Primary Care Physician: Oretha Milch Other Clinician: Referring Physician: Oretha Milch Treating Physician/Extender: Elayne Snare in Treatment: 32 Clinic Level of Care Assessment Items TOOL 4 Quantity Score []  - Use when only an EandM is performed on FOLLOW-UP visit 0 ASSESSMENTS - Nursing  Assessment / Reassessment X - Reassessment of Co-morbidities (includes updates in patient status) 1 10 X - Reassessment of Adherence to Treatment Plan 1 5 ASSESSMENTS - Wound and Skin Assessment / Reassessment []  - Simple Wound Assessment / Reassessment - one wound 0 X - Complex Wound Assessment / Reassessment - multiple wounds 2 5 []  - Dermatologic / Skin Assessment (not related to wound area) 0 ASSESSMENTS - Focused Assessment []  - Circumferential Edema Measurements - multi extremities 0 []  - Nutritional Assessment / Counseling / Intervention 0 []  - Lower Extremity Assessment (monofilament, tuning fork, pulses) 0 []  - Peripheral Arterial Disease Assessment (using hand held doppler) 0 ASSESSMENTS - Ostomy and/or Continence Assessment and Care []  - Incontinence Assessment and Management 0 []  - Ostomy Care Assessment and Management (repouching, etc.) 0 PROCESS - Coordination of Care []  - Simple Patient / Family Education for ongoing care 0 X - Complex (extensive) Patient / Family Education for ongoing care 1 20 X - Staff obtains Chiropractor, Records, Test Results / Process Orders 1 10 []  - Staff telephones HHA, Nursing Homes / Clarify orders / etc 0 []  - Routine Transfer to another Facility (non-emergent condition) 0 [name], [name] ([id number]) []  - Routine Hospital Admission (non-emergent condition) 0 []  - New Admissions / Manufacturing engineer / Ordering NPWT, Apligraf, etc. 0 []  - Emergency Hospital Admission (emergent condition) 0 X - Simple Discharge Coordination 1 10 []  - Complex (extensive) Discharge Coordination 0 PROCESS - Special Needs []  - Pediatric / Minor Patient Management 0 []  - Isolation Patient Management 0 []  - Hearing / Language / Visual special needs 0 []  - Assessment of Community assistance (transportation, D/C planning, etc.) 0 []  - Additional assistance / Altered mentation 0 []  - Support Surface(s) Assessment (bed, cushion, seat, etc.) 0 INTERVENTIONS - Wound  Cleansing / Measurement []  - Simple Wound Cleansing - one wound 0 X - Complex Wound Cleansing - multiple wounds 1 5 X -  Wound Imaging (photographs - any number of wounds) 1 5 []  - Wound Tracing (instead of photographs) 0 []  - Simple Wound Measurement - one wound 0 X - Complex Wound Measurement - multiple wounds 1 5 INTERVENTIONS - Wound Dressings []  - Small Wound Dressing one or multiple wounds 0 X - Medium Wound Dressing one or multiple wounds 1 15 []  - Large Wound Dressing one or multiple wounds 0 []  - Application of Medications - topical 0 []  - Application of Medications - injection 0 INTERVENTIONS - Miscellaneous []  - External ear exam 0 [name], [name] ([id number]) []  - Specimen Collection (cultures, biopsies, blood, body fluids, etc.) 0 []  - Specimen(s) / Culture(s) sent or taken to Lab for analysis 0 []  - Patient Transfer (multiple staff / Michiel Sites Lift / Similar devices) 0 []  - Simple Staple / Suture removal (25 or less) 0 []  - Complex Staple / Suture removal (26 or more) 0 []  - Hypo / Hyperglycemic Management (close monitor of Blood Glucose) 0 []  - Ankle / Brachial Index (ABI) - do not check if billed separately 0 X - Vital Signs 1 5 Has the patient been seen at the hospital within the last three years: Yes Total Score: 100 Level Of Care: New/Established - Level 3 Electronic Signature(s) Signed: 08/15/2016 4:28:52 PM By: Elliot Gurney, RN, [name], Kim RN, [name] Entered By: Elliot Gurney, RN, [name], Kim on 08/15/2016 09:45:03 [name] ([id number]) -------------------------------------------------------------------------------- Encounter Discharge Information Details Patient Name: [name] Date of Service: 08/15/2016 8:30 AM Medical Record Number: [id number] Patient Account Number: [id number] Date of Birth/Sex: [birth year] (70 y.o. Male) Treating RN: Phillis Haggis Primary Care Physician: Oretha Milch Other Clinician: Referring Physician: Oretha Milch Treating Physician/Extender:  Elayne Snare in Treatment: 41 Encounter Discharge Information Items Discharge Pain Level: 3 Discharge Condition: Stable Ambulatory Status: Wheelchair Nursing Discharge Destination: Home Transportation: Private Auto Accompanied By: driver Schedule Follow-up Appointment: Yes Medication Reconciliation completed Yes and provided to Patient/Care Pernella Ackerley: Clinical Summary of Care: Electronic Signature(s) Signed: 08/15/2016 4:28:52 PM By: Elliot Gurney RN, [name], Kim RN, [name] Previous Signature: 08/15/2016 9:44:15 AM Version By: Ardath Sax MD Entered By: Elliot Gurney, RN, [name], Kim on 08/15/2016 09:48:35 [name] ([id number]) -------------------------------------------------------------------------------- Lower Extremity Assessment Details Patient Name: [name] Date of Service: 08/15/2016 8:30 AM Medical Record Number: [id number] Patient Account Number: [id number] Date of Birth/Sex: 07-Apr-1946 (70 y.o. Male) Treating RN: Huel Coventry Primary Care Physician: Oretha Milch Other Clinician: Referring Physician: Oretha Milch Treating Physician/Extender: Ardath Sax Weeks in Treatment: 32 Edema Assessment Assessed: [Left: No] [Right: No] E[Left: dema] [Right: :] Calf Left: Right: Point of Measurement: 34 cm From Medial Instep 40.6 cm 32.5 cm Ankle Left: Right: Point of Measurement: 12 cm From Medial Instep 24.7 cm 23 cm Vascular Assessment Pulses: Posterior Tibial Dorsalis Pedis Palpable: [Left:Yes] [Right:Yes] Extremity colors, hair growth, and conditions: Extremity Color: [Left:Hyperpigmented] [Right:Hyperpigmented] Hair Growth on Extremity: [Left:No] [Right:No] Temperature of Extremity: [Left:Warm] [Right:Warm] Capillary Refill: [Left:< 3 seconds] [Right:< 3 seconds] Dependent Rubor: [Left:No] [Right:No] Blanched when Elevated: [Left:No] [Right:No] Lipodermatosclerosis: [Left:Yes] [Right:Yes] Toe Nail Assessment Left: Right: Thick: Yes Yes Discolored: Yes  Yes Deformed: No No Improper Length and Hygiene: No No Electronic Signature(s) Signed: 08/15/2016 4:28:52 PM By: Elliot Gurney, RN, [name], Kim RN, [name] Garrison, Param VMarland Kitchen ([id number]) Entered By: Elliot Gurney, RN, [name], Kim on 08/15/2016 09:10:14 [name] ([id number]) -------------------------------------------------------------------------------- Multi Wound Chart Details Patient Name: [name] Date of Service: 08/15/2016 8:30 AM Medical Record Number: [id number] Patient Account Number: [id number] Date of Birth/Sex: 17-Dec-1945 (  71 y.o. 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: N/A Venous Leg Ulcer N/A Comorbid History: Cataracts, Chronic Cataracts, Chronic N/A Obstructive Pulmonary Obstructive Pulmonary Disease (COPD), Sleep Disease (COPD), Sleep Apnea, Angina, Apnea, Angina, Congestive Heart Failure, Congestive Heart Failure, Hypertension, Type II Hypertension, Type II Diabetes, Gout, Diabetes, Gout, Osteoarthritis, Neuropathy Osteoarthritis, Neuropathy Date Acquired: 10/31/2015 08/09/2016 N/A Weeks of Treatment: 32 0 N/A Wound Status: Open Open N/A Clustered Wound: No Yes N/A Measurements L x W x D 1.2x3x0.1 15x8.5x0.1 N/A (cm) Area (cm) : 2.827 100.138 N/A Volume (cm) : 0.283 10.014 N/A % Reduction in Area: 60.00% N/A N/A % Reduction in Volume: 80.00% N/A N/A Classification: Category/Stage III Grade 2 N/A HBO Classification:  Grade 1 N/A N/A Twiggs, Daelen V. ([id number]) Exudate Amount: Large Large N/A Exudate Type: Serosanguineous Sanguinous N/A Exudate Color: red, brown red N/A Wound Margin: Flat and Intact Flat and Intact N/A Granulation Amount: Medium (34-66%) Medium (34-66%) N/A Granulation Quality: Red, Pink Red, Friable N/A Necrotic Amount: Small (1-33%) Medium (34-66%) N/A Exposed Structures: Fascia: No Fascia: No N/A Fat: No Fat: No Tendon: No Tendon: No Muscle: No Muscle: No Joint: No Joint: No Bone: No Bone: No Limited to Skin Limited to Skin Breakdown Breakdown Epithelialization: Medium (34-66%) N/A N/A Periwound Skin Texture: Edema: Yes Induration: Yes N/A Edema: No Excoriation: No Callus: No Crepitus: No Fluctuance: No Friable: No Rash: No Scarring: No Periwound Skin Moist: Yes Moist: Yes N/A Moisture: Maceration: No Dry/Scaly: No Periwound Skin Color: Erythema: Yes Hemosiderin Staining: Yes N/A Atrophie Blanche: No Cyanosis: No Ecchymosis: No Erythema: No Mottled: No Pallor: No Rubor: No Erythema Location: Circumferential N/A N/A Temperature: No Abnormality N/A N/A Tenderness on Yes No N/A Palpation: Wound Preparation: Ulcer Cleansing: Other: Ulcer Cleansing: N/A soap and water Rinsed/Irrigated with Saline Topical Anesthetic Applied: Other: lidocaine Topical Anesthetic 4% Applied: None Treatment Notes [name], [name] ([id number]) Electronic Signature(s) Signed: 08/15/2016 4:28:52 PM By: Elliot Gurney, RN, [name], Kim RN, [name] Entered By: Elliot Gurney, RN, [name], Kim on 08/15/2016 09:13:53 [name] ([id number]) -------------------------------------------------------------------------------- Multi-Disciplinary Care Plan Details Patient Name: [name] Date of Service: 08/15/2016 8:30 AM Medical Record Number: [id number] Patient Account Number: [id number] Date of Birth/Sex: June 23, 1946 (70 y.o. Male) Treating RN: Huel Coventry Primary Care Physician: Oretha Milch Other Clinician: Referring Physician: Oretha Milch Treating Physician/Extender: Elayne Snare in Treatment: 15 Active Inactive Abuse / Safety / Falls / Self Care Management Nursing Diagnoses: Potential for falls Goals: Patient will remain injury free Date Initiated: 03/01/2016 Goal Status: Active Interventions: Assess fall risk on admission and as needed Notes: Nutrition Nursing Diagnoses: Imbalanced nutrition Goals: Patient/caregiver agrees to and verbalizes understanding of need to use nutritional supplements and/or vitamins as prescribed Date Initiated: 03/01/2016 Goal Status: Active Interventions: Assess patient nutrition upon admission and as needed per policy Notes: Orientation to the Wound Care Program Nursing Diagnoses: Knowledge deficit related to the wound healing center program Goals: Patient/caregiver will verbalize understanding of the [facility] [address] [name], [name] ([id number]) Date Initiated: 03/01/2016 Goal Status: Active Interventions: Provide education on orientation to the wound center Notes: Pain,  Acute or Chronic Nursing Diagnoses: Pain, acute or chronic: actual or potential Potential alteration in comfort, pain Goals: Patient will verbalize adequate pain control and receive pain control interventions during procedures as needed Date Initiated: 03/01/2016 Goal Status: Active Patient/caregiver will verbalize adequate pain control between visits Date Initiated: 03/01/2016 Goal Status: Active Interventions: Assess comfort goal upon admission Complete pain assessment as per visit requirements Notes: Pressure Nursing Diagnoses: Knowledge deficit related to causes and risk factors for pressure ulcer development Knowledge deficit related to management of pressures ulcers Goals: Patient/caregiver will verbalize risk factors for pressure ulcer development Date Initiated: 03/01/2016 Goal Status: Active Interventions: Assess  offloading mechanisms upon admission and as needed Notes: Wound/Skin Impairment Nursing Diagnoses: [name], [name] ([id number]) Impaired tissue integrity Goals: Ulcer/skin breakdown will have a volume reduction of 30% by week 4 Date Initiated: 03/01/2016 Goal Status: Active Ulcer/skin breakdown will have a volume reduction of 50% by week 8 Date Initiated: 03/01/2016 Goal Status: Active Ulcer/skin breakdown will have a volume reduction of 80% by week 12 Date Initiated: 03/01/2016 Goal Status: Active Interventions: Assess ulceration(s) every visit Notes: Electronic Signature(s) Signed: 08/15/2016 4:28:52 PM By: Elliot Gurney, RN, [name], Kim RN, [name] Entered By: Elliot Gurney, RN, [name], Kim on 08/15/2016 09:13:42 [name] ([id number]) -------------------------------------------------------------------------------- Pain Assessment Details Patient Name: [name] Date of Service: 08/15/2016 8:30 AM Medical Record Number: [id number] Patient Account Number: [id number] Date of Birth/Sex: [birth year] (70 y.o. Male) Treating RN: Huel Coventry Primary Care Physician: Oretha Milch Other Clinician: Referring Physician: Oretha Milch Treating Physician/Extender: Elayne Snare in Treatment: 75 Active Problems Location of Pain Severity and Description of Pain Patient Has Paino Yes Site Locations Pain Location: Generalized Pain With Dressing Change: No Duration of the Pain. Constant / Intermittento Intermittent Rate the pain. Current Pain Level: 4 Pain Management and Medication Current Pain Management: Electronic Signature(s) Signed: 08/15/2016 4:28:52 PM By: Elliot Gurney, RN, [name], Kim RN, [name] Entered By: Elliot Gurney, RN, [name], Kim on 08/15/2016 08:46:08 [name] ([id number]) -------------------------------------------------------------------------------- Patient/Caregiver Education Details Patient Name: [name] Date of Service: 08/15/2016 8:30 AM Medical Record Number:  [id number] Patient Account Number: [id number] Date of Birth/Gender: 01/31/1946 (70 y.o. Male) Treating RN: Phillis Haggis Primary Care Physician: Oretha Milch Other Clinician: Referring Physician: Oretha Milch Treating Physician/Extender: Elayne Snare in Treatment: 39 Education Assessment Education Provided To: Patient and Caregiver Education Topics Provided Wound/Skin Impairment: Handouts: Caring for Your Ulcer, Other: orders sent with patient Methods: Explain/Verbal, Printed Responses: State content correctly Electronic Signature(s) Signed: 08/15/2016 4:28:52 PM By: Elliot Gurney, RN, [name], Kim RN, [name] Entered By: Elliot Gurney, RN, [name], Kim on 08/15/2016 09:49:03 [name] ([id number]) -------------------------------------------------------------------------------- Wound Assessment Details Patient Name: [name] Date of Service: 08/15/2016 8:30 AM Medical Record Number: [id number] Patient Account Number: [id number] Date of Birth/Sex: December 16, 1945 (70 y.o. Male) Treating RN: Huel Coventry Primary Care Physician: Oretha Milch Other Clinician: Referring Physician: Oretha Milch Treating Physician/Extender: Ardath Sax Weeks in Treatment: 32 Wound Status Wound Number: 3 Primary Pressure Ulcer Etiology: Wound Location: Right Calcaneus Wound Open Wounding Event: Pressure Injury Status: Date Acquired: 10/31/2015 Comorbid Cataracts, Chronic Obstructive Weeks Of Treatment: 32 History: Pulmonary Disease (COPD), Sleep Clustered Wound: No Apnea, Angina, Congestive Heart Failure, Hypertension, Type II Diabetes, Gout, Osteoarthritis, Neuropathy Photos Wound Measurements Length: (cm) 1.2 Width: (cm) 3 Depth: (cm) 0.1 Area: (cm) 2.827 Volume: (cm) 0.283 % Reduction in Area: 60% % Reduction in Volume: 80% Epithelialization: Medium (34-66%) Tunneling: No Undermining: No Wound Description Classification: Category/Stage III Foul Odor Diabetic Severity [address]): Grade  1 Wound Margin: Flat and Intact Exudate Amount: Large Exudate Type: Serosanguineous Exudate Color: red, brown After Cleansing: No Wound Bed Granulation Amount: Medium (34-66%) Exposed Structure Granulation Quality: Red, Pink Fascia Exposed: No Necrotic Amount: Small (1-33%) Fat Layer Exposed: No Necrotic Quality: Adherent Slough Tendon Exposed: No Transue, Kayce V. ([id number]) Muscle Exposed: No Joint Exposed: No Bone Exposed: No Limited to Skin Breakdown Periwound Skin Texture Texture Color No Abnormalities Noted: No No Abnormalities Noted: No Localized Edema: Yes Erythema: Yes Erythema Location: Circumferential Moisture No Abnormalities Noted: No Temperature / Pain Moist: Yes Temperature: No Abnormality Tenderness on Palpation: Yes Wound Preparation Ulcer Cleansing: Other: soap and water, Topical Anesthetic Applied: Other: lidocaine 4%, Treatment Notes Wound #3 (Right Calcaneus) 1. Cleansed with: Cleanse wound with antibacterial soap and water 2. Anesthetic Topical Lidocaine 4% cream to wound bed prior to debridement 3. Peri-wound Care: Barrier cream 4. Dressing Applied: Aquacel Ag 5. Secondary Dressing Applied Dry Gauze Kerlix/Conform 7. Secured with Tape Notes heel cup, Aquacel ag; kerlix and coban form toes to knee. Electronic Signature(s) Signed: 08/15/2016 4:28:52 PM By: Elliot Gurney, RN, [name], Kim RN, [name] Entered By: Elliot Gurney, RN, [name], Kim on 08/15/2016 09:00:56 [name] ([id number]) -------------------------------------------------------------------------------- Wound Assessment Details Patient Name: [name] Date of Service: 08/15/2016 8:30 AM Medical Record Number: [id number] Patient Account Number: [id number] Date of Birth/Sex: 1945-12-18 (70 y.o. Male) Treating RN: Huel Coventry Primary Care Physician: Oretha Milch Other Clinician: Referring Physician: Oretha Milch Treating Physician/Extender: Ardath Sax Weeks in Treatment: 32 Wound  Status Wound Number: 9 Primary Diabetic Wound/Ulcer of the Lower Etiology: Extremity Wound Location: Left Lower Leg - Anterior Secondary Venous Leg Ulcer Wounding Event: Gradually Appeared Etiology: Date Acquired: 08/09/2016 Wound Open Weeks Of Treatment: 0 Status: Clustered Wound: Yes Comorbid Cataracts, Chronic Obstructive History: Pulmonary Disease (COPD), Sleep Apnea, Angina, Congestive Heart Failure, Hypertension, Type II Diabetes, Gout, Osteoarthritis, Neuropathy Photos Wound Measurements Length: (cm) 15 % Reduction in Width: (cm) 8.5 % Reduction in Depth: (cm) 0.1 Area: (cm) 100.138 Volume: (cm) 10.014 Area: Volume: Wound Description Classification: Grade 2 Wound Margin: Flat and Intact Exudate Amount: Large Exudate Type: Sanguinous Exudate Color: red Wound Bed Granulation Amount: Medium (34-66%) Exposed Structure Granulation Quality: Red, Friable Fascia Exposed: No Necrotic Amount: Medium (34-66%) Fat Layer Exposed: No [place], Cyle V. ([id number]) Necrotic Quality: Adherent Slough Tendon Exposed: No Muscle Exposed: No Joint Exposed: No Bone Exposed: No Limited to Skin Breakdown Periwound Skin Texture Texture Color No Abnormalities Noted: No No Abnormalities Noted: No Callus: No Atrophie Blanche: No Crepitus: No Cyanosis: No Excoriation: No Ecchymosis: No Fluctuance: No Erythema: No Friable: No Hemosiderin Staining: Yes Induration: Yes Mottled: No Localized Edema: No Pallor: No Rash: No Rubor: No Scarring: No Moisture No Abnormalities Noted: No Dry / Scaly: No Maceration: No Moist: Yes Wound Preparation Ulcer Cleansing: Rinsed/Irrigated with Saline Topical Anesthetic Applied: None Treatment Notes Wound #9 (Left, Anterior Lower Leg) 1. Cleansed with: Cleanse wound with antibacterial soap and water 2. Anesthetic Topical Lidocaine 4% cream to wound bed prior to debridement 3. Peri-wound Care: Barrier cream 4. Dressing  Applied: Aquacel Ag 5. Secondary Dressing Applied Dry Gauze Kerlix/Conform 7. Secured with Tape Notes heel cup, Aquacel ag; kerlix and coban form toes to knee. FONG, MCCARRY ([id number]) Electronic Signature(s) Signed: 08/15/2016 4:28:52 PM By: Elliot Gurney, RN, [name], Kim RN, [name] Entered By: Elliot Gurney, RN, [name], Kim on 08/15/2016 09:03:08 [name] ([id number]) -------------------------------------------------------------------------------- Vitals Details Patient Name: [name] Date of Service: 08/15/2016 8:30 AM Medical Record Number: [id number]  Patient Account Number: [id number] Date of Birth/Sex: 1946-03-09 [age] y.o. Male) Treating RN: Huel CoventryWoody, Kim Primary Care Physician: Oretha MilchSMITH, [name] Other Clinician: Referring Physician: Oretha MilchSMITH, [name] Treating Physician/Extender: Elayne SnarePARKER, PETER Weeks in Treatment: 32 Vital Signs Time Taken: 08:46 Temperature (F): 98.0 Height (in): 70 Pulse (bpm): 78 Weight (lbs): 235 Respiratory Rate (breaths/min): 18 Body Mass Index (BMI): 33.7 Blood Pressure (mmHg): 127/71 Reference Range: 80 - 120 mg / dl Electronic Signature(s) Signed: 08/15/2016 4:28:52 PM By: Elliot GurneyWoody, RN, [name], Kim RN, [name] Entered By: Elliot GurneyWoody, RN, [name], Kim on 08/15/2016 [id number]

## 2016-08-22 ENCOUNTER — Encounter (HOSPITAL_BASED_OUTPATIENT_CLINIC_OR_DEPARTMENT_OTHER): Payer: Medicare Other | Admitting: General Surgery

## 2016-08-22 DIAGNOSIS — E11621 Type 2 diabetes mellitus with foot ulcer: Secondary | ICD-10-CM | POA: Diagnosis not present

## 2016-08-22 DIAGNOSIS — M869 Osteomyelitis, unspecified: Secondary | ICD-10-CM | POA: Diagnosis not present

## 2016-08-22 DIAGNOSIS — E1169 Type 2 diabetes mellitus with other specified complication: Secondary | ICD-10-CM | POA: Diagnosis not present

## 2016-08-22 DIAGNOSIS — L97509 Non-pressure chronic ulcer of other part of unspecified foot with unspecified severity: Secondary | ICD-10-CM

## 2016-08-22 NOTE — Progress Notes (Signed)
See I heal 

## 2016-08-26 NOTE — Progress Notes (Signed)
[name] ([id number]) Visit Report for 08/22/2016 Chief Complaint Document Details Patient Name: [name], [name] Date of Service: 08/22/2016 10:45 AM Medical Record Number: [id number] Patient Account Number: [id number] Date of Birth/Sex: 1946/01/08 (70 y.o. Male) Treating RN: Phillis Haggis Primary Care Physician: Oretha Milch Other Clinician: Referring Physician: Oretha Milch Treating Physician/Extender: Elayne Snare in Treatment: 79 Information Obtained from: Patient Chief Complaint Patient is at the clinic for treatment of an open pressure ulcer the left upper thigh and gluteal region and the right heel with bilateral swelling of the legs all of it which is going on for over a year Electronic Signature(s) Signed: 08/22/2016 3:41:47 PM By: Ardath Sax MD Entered By: Ardath Sax on 08/22/2016 11:36:27 [name] ([id number]) -------------------------------------------------------------------------------- HPI Details Patient Name: [name] Date of Service: 08/22/2016 10:45 AM Medical Record Number: [id number] Patient Account Number: [id number] Date of Birth/Sex: [birth year] (70 y.o. Male) Treating RN: Phillis Haggis Primary Care Physician: Oretha Milch Other Clinician: Referring Physician: Oretha Milch Treating Physician/Extender: Elayne Snare in Treatment: 62 History of Present Illness Location: ulcerated area on the right heel, left gluteal region and thigh and then bilateral lower extremities Quality: Patient reports experiencing a sharp pain to affected area(s). Severity: Patient states wound are getting worse. Duration: Patient has had the wound for > 12 months prior to seeking treatment at the wound center Timing: Pain in wound is constant (hurts all the time) Context: The wound appeared gradually over time Modifying Factors: Other treatment(s) tried include: he sees his heart doctor and his primary care doctor Associated Signs and  Symptoms: patient has not been able to walk for over a year now HPI Description: 70 year old gentleman with a known history of hypertension, diabetes, obstructive sleep apnea, COPD, diastolic CHF, coronary artery disease was admitted to the hospital with sepsis from an ulcer of the right heel and was treated there in October 2016. he also has chronic bilateral lower extremity edema and lymphedema. He had received vancomycin, Zosyn and at that stage and x-rays showed hardware in the right ankle but no evidence of osteomyelitis. he was a former smoker. he was also treated with Augmentin orally for 10 days. He is either bedbound or wheelchair-bound and does not ambulate by himself. 01/19/2016 -- he has not been seen here for 3 weeks and this was because he was admitted to [facility] between 223 and 01/08/2016 for sepsis, UTI and pneumonia involving the left lung. he was treated with IV vancomycin and Zosyn and then meropenem. Was discharged home on oral Bactrim for 2 weeks none of his vascular test or x-rays were done and we will reorder these. 01/26/2016 -- he has not yet done the x-ray of his foot and is vascular tests are still pending. I have asked him to work on these with his nursing home staff. Addendum: he has got an x-ray of the right foot done which shows that his osteopenia but no specific ostial lysis or abnormal periosteal reaction. Final impression was degenerative changes with dorsal foot soft tissue swelling. 02/16/2016 -- lower extremity arterial duplex examination shows a 50-99% stenosis of the right tibioperoneal trunk. He had biphasic flow in the right SFA, popliteal and tibioperoneal trunk. Left-sided he had triphasic flow throughout 02/29/2016 -- he is awaiting his vascular opinion with Dr. Gilda Crease which is to be done on April 24 03/08/2016 -- he has not kept his appointment with Dr. Gilda Crease on April 24 and does not seem to know what happened about  this. it's difficult to gauge whether he is in full control of his mental faculties as at times he is extremely rude to the nursing staff. 03/22/2016 -- the patient was seen by Dr. 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 Recommended that the patient had severe atherosclerotic changes of both lower extremities associated with ulceration and tissue loss of the foot. This is a limb threatening ischemia and the patient was recommended to undergo Brilliant, Leonardo V. ([id number]) angiography of the lower extremity with a hope for intervention for limb salvage. Patient agreed and will proceed to angiography. He was admitted to the hospital between May 2 and 03/15/2016 - he underwent induction of a catheter into his right lower extremity and third order catheter placement with contrast injection to the right lower extremity for distal runoff. Percutaneous transluminal angioplasty of the right superficial femoral and popliteal arteries were done. He also had a right peroneal angioplasty. Patient had a postoperative hematoma and was admitted for observation and in the next 24 hours he was very agitated and combative and had to be seen by psychiatric. He had a follow-up with Dr. Gilda CreaseSchnier in 3 weeks. 04/18/2016 -- notes reviewed from the vascular office where he was seen for his postop visit after the PTA of the right SFA and popliteal arteries on 03/12/2016. He underwent an ABI which showed right ABI to be more than 1.3 and left to be more than 1 more than 1.3, great toe and PPG waveforms are decreased bilaterally. No additional intervention indicated at this time and the patient was to follow-up in 3 months with an ABI and bilateral lower extremity duplex study. 05/02/2016 -- he complains that his compression stockings are causing him a lot of discomfort and he is not happy wearing  them. 05/30/2016 -- the swelling of both legs has increased again and he has had blisters which have opened out into ulcerations. Electronic Signature(s) Signed: 08/22/2016 3:41:47 PM By: Ardath SaxParker, Bjorn Hallas MD Entered By: Ardath SaxParker, Maryuri Warnke on 08/22/2016 11:36:36 [name] OttoLINDLEY, Mikail V. ([id number]) -------------------------------------------------------------------------------- Physical Exam Details Patient Name: [name] OttoLINDLEY, Demetres V. Date of Service: 08/22/2016 10:45 AM Medical Record Number: [id number] Patient Account Number: [id number] Date of Birth/Sex: [birth year](70 y.o. Male) Treating RN: Phillis HaggisPinkerton, Debi Primary Care Physician: Oretha MilchSMITH, [name] Other Clinician: Referring Physician: Oretha MilchSMITH, [name] Treating Physician/Extender: Elayne SnarePARKER, Giang Hemme Weeks in Treatment: 1133 Electronic Signature(s) Signed: 08/22/2016 3:41:47 PM By: Ardath SaxParker, Damary Doland MD Entered By: Ardath SaxParker, Shanette Tamargo on 08/22/2016 11:36:43 [name] OttoLINDLEY, Ashwath V. ([id number]) -------------------------------------------------------------------------------- Physician Orders Details Patient Name: [name] OttoLINDLEY, Alcario V. Date of Service: 08/22/2016 10:45 AM Medical Record Number: [id number] Patient Account Number: [id number] Date of Birth/Sex: [birth year](70 y.o. Male) Treating RN: Phillis HaggisPinkerton, Debi Primary Care Physician: Oretha MilchSMITH, [name] Other Clinician: Referring Physician: Oretha MilchSMITH, [name] Treating Physician/Extender: Elayne SnarePARKER, Calvert Charland Weeks in Treatment: 2433 Verbal / Phone Orders: Yes Clinician: Ashok CordiaPinkerton, Debi Read Back and Verified: Yes Diagnosis Coding Wound Cleansing Wound #10 Right,Anterior Lower Leg o No tub bath. - only sink bath or bed bath Wound #3 Right Calcaneus o No tub bath. - only sink bath or bed bath Wound #9 Left,Anterior Lower Leg o No tub bath. - only sink bath or bed bath Anesthetic Wound #10 Right,Anterior Lower Leg o Topical Lidocaine 4% cream applied to wound bed prior to debridement - for clinic use only Wound #3 Right Calcaneus o Topical  Lidocaine 4% cream applied to wound bed prior to debridement - for clinic use only  Wound #9 Left,Anterior Lower Leg o Topical Lidocaine 4% cream applied to wound bed prior to debridement - for clinic use only Skin Barriers/Peri-Wound Care Wound #10 Right,Anterior Lower Leg o Barrier cream - around the heel wound to reddened and irritated areas Wound #3 Right Calcaneus o Barrier cream - around the heel wound to reddened and irritated areas Wound #9 Left,Anterior Lower Leg o Barrier cream - around the heel wound to reddened and irritated areas Primary Wound Dressing Wound #10 Right,Anterior Lower Leg o Prisma Ag - moisten with saline Wound #3 Right Calcaneus o Prisma Ag - moisten with saline Kozuch, Adante V. ([id number]) Wound #9 Left,Anterior Lower Leg o Prisma Ag - moisten with saline Secondary Dressing Wound #3 Right Calcaneus o Dry Gauze - Heel cups Dressing Change Frequency Wound #10 Right,Anterior Lower Leg o Change dressing every week Wound #3 Right Calcaneus o Change dressing every other day. - unless saturated or soiled Wound #9 Left,Anterior Lower Leg o Change dressing every week Follow-up Appointments Wound #3 Right Calcaneus o Return Appointment in 1 week. Edema Control o 3 Layer Compression System - Bilateral o Other: - unna to anchor Off-Loading Wound #3 Right Calcaneus o Turn and reposition every 2 hours o Other: - sage boots at night and float heel when lying in bed Additional Orders / Instructions Wound #3 Right Calcaneus o Increase protein intake. Medications-please add to medication list. Wound #3 Right Calcaneus o Other: - Vitamin C, Vitamin A, Zinc, multivitamin Electronic Signature(s) Signed: 08/22/2016 3:41:47 PM By: Ardath Sax MD Signed: 08/26/2016 4:03:46 PM By: Alejandro Mulling Entered By: Alejandro Mulling on 08/22/2016 11:54:44 Angus, Janie V. ([id number]) [name], Cyree VMarland Kitchen  ([id number]) -------------------------------------------------------------------------------- Problem List Details Patient Name: [name], Kennen V. Date of Service: 08/22/2016 10:45 AM Medical Record Number: [id number] Patient Account Number: [id number] Date of Birth/Sex: [birth year] (70 y.o. Male) Treating RN: Phillis Haggis Primary Care Physician: Oretha Milch Other Clinician: Referring Physician: Oretha Milch Treating Physician/Extender: Elayne Snare in Treatment: 64 Active Problems ICD-10 Encounter Code Description Active Date Diagnosis E11.621 Type 2 diabetes mellitus with foot ulcer 01/01/2016 Yes L89.613 Pressure ulcer of right heel, stage 3 01/01/2016 Yes E66.01 Morbid (severe) obesity due to excess calories 01/01/2016 Yes I89.0 Lymphedema, not elsewhere classified 01/01/2016 Yes M70.871 Other soft tissue disorders related to use, overuse and 01/19/2016 Yes pressure, right ankle and foot 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/16/2016 Yes ulceration of heel and midfoot Inactive Problems Resolved Problems ICD-10 Code Description Active Date Resolved Date L89.322 Pressure ulcer of left buttock, stage 2 01/01/2016 01/01/2016 Electronic Signature(s) Signed: 08/22/2016 3:41:47 PM By: Ardath Sax MD Entered By: Ardath Sax on 08/22/2016 11:35:56 [name] ([id number]) [name], Regginald VMarland Kitchen ([id number]) -------------------------------------------------------------------------------- Progress Note Details Patient Name: [name] Date of Service: 08/22/2016 10:45 AM Medical Record Number: [id number] Patient Account Number: [id number] Date of Birth/Sex: Aug 28, 1946 (70 y.o. Male) Treating RN: Phillis Haggis Primary Care Physician: Oretha Milch Other Clinician: Referring Physician: Oretha Milch Treating Physician/Extender: Elayne Snare in Treatment: 68 Subjective Chief Complaint Information obtained from Patient Patient is at the clinic  for treatment of an open pressure ulcer the left upper thigh and gluteal region and the right heel with bilateral swelling of the legs all of it which is going on for over a year History of Present Illness (HPI) The following HPI elements were documented for the patient's wound: Location: ulcerated area on the right heel, left gluteal region and thigh and then bilateral lower extremities Quality: Patient reports  experiencing a sharp pain to affected area(s). Severity: Patient states wound are getting worse. Duration: Patient has had the wound for > 12 months prior to seeking treatment at the wound center Timing: Pain in wound is constant (hurts all the time) Context: The wound appeared gradually over time Modifying Factors: Other treatment(s) tried include: he sees his heart doctor and his primary care doctor Associated Signs and Symptoms: patient has not been able to walk for over a year now 70 year old gentleman with a known history of hypertension, diabetes, obstructive sleep apnea, COPD, diastolic CHF, coronary artery disease was admitted to the hospital with sepsis from an ulcer of the right heel and was treated there in October 2016. he also has chronic bilateral lower extremity edema and lymphedema. He had received vancomycin, Zosyn and at that stage and x-rays showed hardware in the right ankle but no evidence of osteomyelitis. he was a former smoker. he was also treated with Augmentin orally for 10 days. He is either bedbound or wheelchair-bound and does not ambulate by himself. 01/19/2016 -- he has not been seen here for 3 weeks and this was because he was admitted to [facility] between 223 and 01/08/2016 for sepsis, UTI and pneumonia involving the left lung. he was treated with IV vancomycin and Zosyn and then meropenem. Was discharged home on oral Bactrim for 2 weeks none of his vascular test or x-rays were done and we will reorder these. 01/26/2016 -- he has  not yet done the x-ray of his foot and is vascular tests are still pending. I have asked him to work on these with his nursing home staff. Addendum: he has got an x-ray of the right foot done which shows that his osteopenia but no specific ostial lysis or abnormal periosteal reaction. Final impression was degenerative changes with dorsal foot soft tissue swelling. 02/16/2016 -- lower extremity arterial duplex examination shows a 50-99% stenosis of the right tibioperoneal trunk. He had biphasic flow in the right SFA, popliteal and tibioperoneal trunk. Left-sided he had triphasic flow throughout QUAYSHAWN, NIN ([id number]) 02/29/2016 -- he is awaiting his vascular opinion with Dr. Gilda Crease which is to be done on April 24 03/08/2016 -- he has not kept his appointment with Dr. Gilda Crease on April 24 and does not seem to know what happened about this. it's difficult to gauge whether he is in full control of his mental faculties as at times he is extremely rude to the nursing staff. 03/22/2016 -- the patient was seen by Dr. 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 Recommended that the patient had severe atherosclerotic changes of both lower extremities associated with ulceration and tissue loss of the foot. This is a limb threatening ischemia and the patient was recommended to undergo angiography of the lower extremity with a hope for intervention for limb salvage. Patient agreed and will proceed to angiography. He was admitted to the hospital between May 2 and 03/15/2016 - he underwent induction of a catheter into his right lower extremity and third order catheter placement with contrast injection to the right lower extremity for distal runoff. Percutaneous transluminal angioplasty of the right superficial femoral and popliteal arteries were done. He also had a right peroneal  angioplasty. Patient had a postoperative hematoma and was admitted for observation and in the next 24 hours he was very agitated and combative and had to be seen by psychiatric.  He had a follow-up with Dr. Gilda Crease in 3 weeks. 04/18/2016 -- notes reviewed from the vascular office where he was seen for his postop visit after the PTA of the right SFA and popliteal arteries on 03/12/2016. He underwent an ABI which showed right ABI to be more than 1.3 and left to be more than 1 more than 1.3, great toe and PPG waveforms are decreased bilaterally. No additional intervention indicated at this time and the patient was to follow-up in 3 months with an ABI and bilateral lower extremity duplex study. 05/02/2016 -- he complains that his compression stockings are causing him a lot of discomfort and he is not happy wearing them. 05/30/2016 -- the swelling of both legs has increased again and he has had blisters which have opened out into ulcerations. Objective Constitutional Vitals Time Taken: 10:51 AM, Height: 70 in, Weight: 235 lbs, BMI: 33.7, Temperature: 97.6 F, Pulse: 66 bpm, Respiratory Rate: 18 breaths/min, Blood Pressure: 109/61 mmHg. Integumentary (Hair, Skin) Wound #10 status is Open. Original cause of wound was Blister. The wound is located on the Right,Anterior Lower Leg. The wound measures 0.5cm length x 3cm width x 0.1cm depth; 1.178cm^2 area and 0.118cm^3 volume. The wound is limited to skin breakdown. There is no tunneling or undermining noted. There is a medium amount of serous drainage noted. The wound margin is distinct with the outline attached to the wound base. There is large (67-100%) red granulation within the wound bed. There is no necrotic tissue within the wound bed. The periwound skin appearance exhibited: Localized Edema, Moist, Erythema. The surrounding wound skin color is noted with erythema which is circumferential. Periwound temperature was noted as No Abnormality. The  periwound has tenderness on palpation. Bresnan, Mekhai V. ([id number]) Wound #3 status is Open. Original cause of wound was Pressure Injury. The wound is located on the Right Calcaneus. The wound measures 1.2cm length x 2.8cm width x 0.1cm depth; 2.639cm^2 area and 0.264cm^3 volume. The wound is limited to skin breakdown. There is no tunneling or undermining noted. There is a large amount of serous drainage noted. The wound margin is flat and intact. There is medium (34-66%) red, pink granulation within the wound bed. There is a medium (34-66%) amount of necrotic tissue within the wound bed including Adherent Slough. The periwound skin appearance exhibited: Localized Edema, Moist, Erythema. The surrounding wound skin color is noted with erythema which is circumferential. Periwound temperature was noted as No Abnormality. The periwound has tenderness on palpation. Wound #9 status is Open. Original cause of wound was Gradually Appeared. The wound is located on the Left,Anterior Lower Leg. The wound measures 15cm length x 8.5cm width x 0.1cm depth; 100.138cm^2 area and 10.014cm^3 volume. The wound is limited to skin breakdown. There is no tunneling or undermining noted. There is a large amount of serosanguineous drainage noted. The wound margin is flat and intact. There is medium (34-66%) red, friable granulation within the wound bed. There is a medium (34- 66%) amount of necrotic tissue within the wound bed including Adherent Slough. The periwound skin appearance exhibited: Induration, Moist, Hemosiderin Staining. The periwound skin appearance did not exhibit: Callus, Crepitus, Excoriation, Fluctuance, Friable, Localized Edema, Rash, Scarring, Dry/Scaly, Maceration, Atrophie Blanche, Cyanosis, Ecchymosis, Mottled, Pallor, Rubor, Erythema. Patirnt has diabetic ulcer right heel. Both legs edematous. Will continue collagen to wounds and compression wrap Assessment Active Problems ICD-10 E11.621 -  Type 2 diabetes mellitus with foot ulcer L89.613 - Pressure ulcer of right heel, stage 3 E66.01 - Morbid (severe)  obesity due to excess calories I89.0 - Lymphedema, not elsewhere classified M70.871 - Other soft tissue disorders related to use, overuse and pressure, right ankle and foot I70.234 - Atherosclerosis of native arteries of right leg with ulceration of heel and midfoot Plan Wound Cleansing: Wound #3 Right Calcaneus: No tub bath. - only sink bath or bed bath Deuser, Sylvan V. ([id number]) Wound #10 Right,Anterior Lower Leg: No tub bath. - only sink bath or bed bath Wound #9 Left,Anterior Lower Leg: No tub bath. - only sink bath or bed bath Anesthetic: Wound #10 Right,Anterior Lower Leg: Topical Lidocaine 4% cream applied to wound bed prior to debridement - for clinic use only Wound #3 Right Calcaneus: Topical Lidocaine 4% cream applied to wound bed prior to debridement - for clinic use only Wound #9 Left,Anterior Lower Leg: Topical Lidocaine 4% cream applied to wound bed prior to debridement - for clinic use only Skin Barriers/Peri-Wound Care: Wound #3 Right Calcaneus: Barrier cream - around the heel wound to reddened and irritated areas Wound #10 Right,Anterior Lower Leg: Barrier cream - around the heel wound to reddened and irritated areas Wound #9 Left,Anterior Lower Leg: Barrier cream - around the heel wound to reddened and irritated areas Primary Wound Dressing: Wound #3 Right Calcaneus: Prisma Ag - moisten with saline Wound #10 Right,Anterior Lower Leg: Prisma Ag - moisten with saline Wound #9 Left,Anterior Lower Leg: Prisma Ag - moisten with saline Secondary Dressing: Wound #3 Right Calcaneus: Dry Gauze - Heel cups Dressing Change Frequency: Wound #3 Right Calcaneus: Change dressing every other day. - unless saturated or soiled Wound #10 Right,Anterior Lower Leg: Change dressing every week Wound #9 Left,Anterior Lower Leg: Change dressing every  week Follow-up Appointments: Wound #3 Right Calcaneus: Return Appointment in 1 week. Edema Control: Other: - Bilateral kerlix and coban unna to anchor Off-Loading: Wound #3 Right Calcaneus: Turn and reposition every 2 hours Other: - sage boots at night and float heel when lying in bed Additional Orders / Instructions: Wound #3 Right Calcaneus: Increase protein intake. Medications-please add to medication list.: Wound #3 Right Calcaneus: Other: - Vitamin C, Vitamin A, Zinc, multivitamin Mcclay, Winslow V. ([id number]) Electronic Signature(s) Signed: 08/22/2016 3:41:47 PM By: Ardath Sax MD Entered By: Ardath Sax on 08/22/2016 11:38:52 [name] ([id number]) -------------------------------------------------------------------------------- SuperBill Details Patient Name: [name] Date of Service: 08/22/2016 Medical Record Number: [id number] Patient Account Number: [id number] Date of Birth/Sex: [birth year] (70 y.o. Male) Treating RN: Phillis Haggis Primary Care Physician: Oretha Milch Other Clinician: Referring Physician: Oretha Milch Treating Physician/Extender: Elayne Snare in Treatment: 33 Diagnosis Coding ICD-10 Codes Code Description E11.621 Type 2 diabetes mellitus with foot ulcer L89.613 Pressure ulcer of right heel, stage 3 E66.01 Morbid (severe) obesity due to excess calories I89.0 Lymphedema, not elsewhere classified M70.871 Other soft tissue disorders related to use, overuse and pressure, right ankle and foot 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 Facility Procedures CPT4: Description Modifier Quantity Code [id number] BILATERAL: Application of multi-layer venous compression 1 system; leg (below knee), including ankle and foot. Physician Procedures CPT4 Code: [id number] Description: 99213 - WC PHYS LEVEL 3 - EST PT ICD-10 Description Diagnosis E11.621 Type 2 diabetes mellitus with foot  ulcer I89.0 Lymphedema, not elsewhere classified Modifier: Quantity: 1 Electronic Signature(s) Signed: 08/22/2016 3:41:47 PM By: Ardath Sax MD Signed: 08/26/2016 4:03:46 PM By: Alejandro Mulling Entered By: Alejandro Mulling on 08/22/2016 12:13:04

## 2016-08-26 NOTE — Progress Notes (Signed)
[name], Keval V. ([id number]) Visit Report for 08/22/2016 Arrival Information Details Patient Name: [name], Amil V. Date of Service: 08/22/2016 10:45 AM Medical Record Number: [id number] Patient Account Number: [id number] Date of Birth/Sex: Sep 22, 1946 [age] y.o. Male) Treating RN: Phillis HaggisPinkerton, Debi Primary Care Physician: Oretha MilchSMITH, [name] Other Clinician: Referring Physician: Oretha MilchSMITH, [name] Treating Physician/Extender: Elayne SnarePARKER, PETER Weeks in Treatment: 2033 Visit Information History Since Last Visit All ordered tests and consults were completed: No Patient Arrived: Wheel Chair Added or deleted any medications: No Arrival Time: 10:50 Any new allergies or adverse reactions: No Accompanied By: self Had a fall or experienced change in No Transfer Assistance: EasyPivot activities of daily living that may affect Patient Lift risk of falls: Patient Identification Verified: Yes Signs or symptoms of abuse/neglect since last No Secondary Verification Process Yes visito Completed: Hospitalized since last visit: No Patient Requires Transmission- No Pain Present Now: No Based Precautions: Patient Has Alerts: Yes Patient Alerts: DM II Electronic Signature(s) Signed: 08/26/2016 4:03:46 PM By: Alejandro MullingPinkerton, Debra Entered By: Alejandro MullingPinkerton, Debra on 08/22/2016 10:50:23 [name], [name] ([id number]) -------------------------------------------------------------------------------- Encounter Discharge Information Details Patient Name: [name], [name] Date of Service: 08/22/2016 10:45 AM Medical Record Number: [id number] Patient Account Number: [id number] Date of Birth/Sex: Sep 22, 1946 [age] y.o. Male) Treating RN: Phillis HaggisPinkerton, Debi Primary Care Physician: Oretha MilchSMITH, [name] Other Clinician: Referring Physician: Oretha MilchSMITH, [name] Treating Physician/Extender: Elayne SnarePARKER, PETER Weeks in Treatment: 5333 Encounter Discharge Information Items Discharge Pain Level: 0 Discharge Condition: Stable Ambulatory Status:  Wheelchair Discharge Destination: Nursing Home Transportation: Other Accompanied By: self Schedule Follow-up Appointment: Yes Medication Reconciliation completed and provided to Patient/Care Yes Sylus Stgermain: Provided on Clinical Summary of Care: 08/22/2016 Form Type Recipient Paper Patient EL Electronic Signature(s) Signed: 08/26/2016 4:03:46 PM By: Alejandro MullingPinkerton, Debra Previous Signature: 08/22/2016 11:54:40 AM Version By: Gwenlyn PerkingMoore, Shelia Entered By: Alejandro MullingPinkerton, Debra on 08/22/2016 12:15:50 Bedonie, Dvontae Seth BakeV. ([id number]) -------------------------------------------------------------------------------- Lower Extremity Assessment Details Patient Name: [name], Hollie V. Date of Service: 08/22/2016 10:45 AM Medical Record Number: [id number] Patient Account Number: [id number] Date of Birth/Sex: Sep 22, 1946 [age] y.o. Male) Treating RN: Phillis HaggisPinkerton, Debi Primary Care Physician: Oretha MilchSMITH, [name] Other Clinician: Referring Physician: Oretha MilchSMITH, [name] Treating Physician/Extender: Ardath SaxPARKER, PETER Weeks in Treatment: 33 Edema Assessment Assessed: [Left: No] [Right: No] E[Left: dema] [Right: :] Calf Left: Right: Point of Measurement: 34 cm From Medial Instep 38.2 cm 32.5 cm Ankle Left: Right: Point of Measurement: 12 cm From Medial Instep 24.5 cm 23.5 cm Vascular Assessment Pulses: Posterior Tibial Extremity colors, hair growth, and conditions: Extremity Color: [Left:Hyperpigmented] [Right:Hyperpigmented] Temperature of Extremity: [Left:Cool] [Right:Cool] Toe Nail Assessment Left: Right: Thick: Yes Yes Discolored: Yes Yes Deformed: No No Improper Length and Hygiene: No No Notes Left thigh- 50cm Left inseam- 66cm Right thigh- 48.5cm Right inseam- 66cm Electronic Signature(s) Signed: 08/26/2016 4:03:46 PM By: Alejandro MullingPinkerton, Debra Entered By: Alejandro MullingPinkerton, Debra on 08/22/2016 12:22:55 Lanzer, Jacqueline Seth BakeV. ([id number]) [name] GuyLINDLEY, Dao V.  ([id number]) -------------------------------------------------------------------------------- Multi Wound Chart Details Patient Name: [name], Dannie V. Date of Service: 08/22/2016 10:45 AM Medical Record Number: [id number] Patient Account Number: [id number] Date of Birth/Sex: Sep 22, 1946 [age] y.o. Male) Treating RN: Phillis HaggisPinkerton, Debi Primary Care Physician: Oretha MilchSMITH, [name] Other Clinician: Referring Physician: Oretha MilchSMITH, [name] Treating Physician/Extender: Ardath SaxPARKER, PETER Weeks in Treatment: 33 Vital Signs Height(in): 70 Pulse(bpm): 66 Weight(lbs): 235 Blood Pressure 109/61 (mmHg): Body Mass Index(BMI): 34 Temperature(F): 97.6 Respiratory Rate 18 (breaths/min): Photos: [10:No Photos] [3:No Photos] [9:No Photos] Wound Location: [10:Right Lower Leg - Anterior Right Calcaneus] [9:Left Lower Leg - Anterior] Wounding Event: [10:Blister] [3:Pressure Injury] [9:Gradually Appeared] Primary Etiology: [10:Lymphedema] [3:Pressure Ulcer] [9:Diabetic Wound/Ulcer of the  Lower Extremity] Secondary Etiology: [10: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] [9:Venous Leg Ulcer] Comorbid History: [10:Cataracts, Chronic Obstructive Pulmonary Disease (COPD), Sleep Disease (COPD), Sleep Disease (COPD), Sleep Apnea, Angina, Congestive Heart Failure, Congestive Heart Failure, Congestive Heart Failure, Hypertension, Type II Diabetes,  Gout, Osteoarthritis, Neuropathy Osteoarthritis, Neuropathy Osteoarthritis, Neuropathy] [3:Cataracts, Chronic Obstructive Pulmonary Apnea, Angina, Hypertension, Type II Diabetes, Gout,] [9:Cataracts, Chronic Obstructive Pulmonary Apnea, Angina,  Hypertension, Type II Diabetes, Gout,] Date Acquired: [10:08/22/2016] [3:10/31/2015] [9:08/09/2016] Weeks of Treatment: [10:0] [3:33] [9:1] Wound Status: [10:Open] [3:Open] [9:Open] Clustered Wound: [10:No] [3:No] [9:Yes] Measurements L x W x D 0.5x3x0.1 [3:1.2x2.8x0.1] [9:15x8.5x0.1] (cm) Area (cm) : [10:1.178] [3:2.639] [9:100.138] Volume  (cm) : [10:0.118] [3:0.264] [9:10.014] % Reduction in Area: [10:N/A] [3:62.70%] [9:0.00%] % Reduction in Volume: N/A [3:81.30%] [9:0.00%] Classification: [10:Partial Thickness] [3:Category/Stage III] [9:Grade 2] HBO Classification: [10:Grade 1] [3:Grade 1] [9:N/A] Exudate Amount: [10:Medium] [3:Large] [9:Large] Exudate Type: [10:Serous] [3:Serous] [9:Serosanguineous] Exudate Color: [10:amber] [3:amber] [9:red, brown] Wound Margin: Distinct, outline attached Flat and Intact Flat and Intact Granulation Amount: Large (67-100%) Medium (34-66%) Medium (34-66%) Granulation Quality: Red Red, Pink Red, Friable Necrotic Amount: None Present (0%) Medium (34-66%) Medium (34-66%) Exposed Structures: Fascia: No Fascia: No Fascia: No Fat: No Fat: No Fat: No Tendon: No Tendon: No Tendon: No Muscle: No Muscle: No Muscle: No Joint: No Joint: No Joint: No Bone: No Bone: No Bone: No Limited to Skin Limited to Skin Limited to Skin Breakdown Breakdown Breakdown Epithelialization: None Medium (34-66%) N/A Periwound Skin Texture: Edema: Yes Edema: Yes Induration: Yes Edema: No Excoriation: No Callus: No Crepitus: No Fluctuance: No Friable: No Rash: No Scarring: No Periwound Skin Moist: Yes Moist: Yes Moist: Yes Moisture: Maceration: No Dry/Scaly: No Periwound Skin Color: Erythema: Yes Erythema: Yes Hemosiderin Staining: Yes Atrophie Blanche: No Cyanosis: No Ecchymosis: No Erythema: No Mottled: No Pallor: No Rubor: No Erythema Location: Circumferential Circumferential N/A Temperature: No Abnormality No Abnormality N/A Tenderness on Yes Yes No Palpation: Wound Preparation: Ulcer Cleansing: Other: Ulcer Cleansing: Other: Ulcer Cleansing: soap and water soap and water Rinsed/Irrigated with Saline Topical Anesthetic Topical Anesthetic Applied: None Applied: Other: lidocaine Topical Anesthetic 4% Applied: None Treatment Notes Electronic Signature(s) Signed: 08/26/2016  4:03:46 PM By: Alejandro Mulling Entered By: Alejandro Mulling on 08/22/2016 11:30:29 [name] Otto ([id number]) [name], [name] ([id number]) -------------------------------------------------------------------------------- Multi-Disciplinary Care Plan Details Patient Name: [name] Otto Date of Service: 08/22/2016 10:45 AM Medical Record Number: [id number] Patient Account Number: [id number] Date of Birth/Sex: 13-Aug-1946 (70 y.o. Male) Treating RN: Phillis Haggis Primary Care Physician: Oretha Milch Other Clinician: Referring Physician: Oretha Milch Treating Physician/Extender: Elayne Snare in Treatment: 46 Active Inactive Abuse / Safety / Falls / Self Care Management Nursing Diagnoses: Potential for falls Goals: Patient will remain injury free Date Initiated: 03/01/2016 Goal Status: Active Interventions: Assess fall risk on admission and as needed Notes: Nutrition Nursing Diagnoses: Imbalanced nutrition Goals: Patient/caregiver agrees to and verbalizes understanding of need to use nutritional supplements and/or vitamins as prescribed Date Initiated: 03/01/2016 Goal Status: Active Interventions: Assess patient nutrition upon admission and as needed per policy Notes: Orientation to the Wound Care Program Nursing Diagnoses: Knowledge deficit related to the wound healing center program Goals: Patient/caregiver will verbalize understanding of the [facility] [address] [place], [place] ([id number]) Date Initiated: 03/01/2016 Goal Status: Active Interventions: Provide education on orientation to the wound center Notes: Pain, Acute or Chronic Nursing Diagnoses: Pain, acute or chronic: actual or potential Potential alteration in comfort, pain Goals: Patient will verbalize adequate pain control and receive  pain control interventions during procedures as needed Date Initiated: 03/01/2016 Goal Status: Active Patient/caregiver will verbalize adequate pain  control between visits Date Initiated: 03/01/2016 Goal Status: Active Interventions: Assess comfort goal upon admission Complete pain assessment as per visit requirements Notes: Pressure Nursing Diagnoses: Knowledge deficit related to causes and risk factors for pressure ulcer development Knowledge deficit related to management of pressures ulcers Goals: Patient/caregiver will verbalize risk factors for pressure ulcer development Date Initiated: 03/01/2016 Goal Status: Active Interventions: Assess offloading mechanisms upon admission and as needed Notes: Wound/Skin Impairment Nursing Diagnoses: NEHEMIAH, MCFARREN ([id number]) Impaired tissue integrity Goals: Ulcer/skin breakdown will have a volume reduction of 30% by week 4 Date Initiated: 03/01/2016 Goal Status: Active Ulcer/skin breakdown will have a volume reduction of 50% by week 8 Date Initiated: 03/01/2016 Goal Status: Active Ulcer/skin breakdown will have a volume reduction of 80% by week 12 Date Initiated: 03/01/2016 Goal Status: Active Interventions: Assess ulceration(s) every visit Notes: Electronic Signature(s) Signed: 08/26/2016 4:03:46 PM By: Alejandro Mulling Entered By: Alejandro Mulling on 08/22/2016 11:30:01 Andonian, [name] ([id number]) -------------------------------------------------------------------------------- Pain Assessment Details Patient Name: [name] Otto. Date of Service: 08/22/2016 10:45 AM Medical Record Number: [id number] Patient Account Number: [id number] Date of Birth/Sex: 1946/02/10 (70 y.o. Male) Treating RN: Phillis Haggis Primary Care Physician: Oretha Milch Other Clinician: Referring Physician: Oretha Milch Treating Physician/Extender: Elayne Snare in Treatment: 71 Active Problems Location of Pain Severity and Description of Pain Patient Has Paino No Site Locations With Dressing Change: No Pain Management and Medication Current Pain Management: Electronic  Signature(s) Signed: 08/26/2016 4:03:46 PM By: Alejandro Mulling Entered By: Alejandro Mulling on 08/22/2016 10:50:30 [name] Otto ([id number]) -------------------------------------------------------------------------------- Patient/Caregiver Education Details Patient Name: [name] Otto Date of Service: 08/22/2016 10:45 AM Medical Record Number: [id number] Patient Account Number: [id number] Date of Birth/Gender: 03-07-1946 (70 y.o. Male) Treating RN: Phillis Haggis Primary Care Physician: Oretha Milch Other Clinician: Referring Physician: Oretha Milch Treating Physician/Extender: Elayne Snare in Treatment: 33 Education Assessment Education Provided To: Patient Education Topics Provided Wound/Skin Impairment: Handouts: Other: change dressing as ordered and do not get dressing wet Methods: Demonstration, Explain/Verbal Responses: State content correctly Electronic Signature(s) Signed: 08/26/2016 4:03:46 PM By: Alejandro Mulling Entered By: Alejandro Mulling on 08/22/2016 12:16:04 [name] Otto ([id number]) -------------------------------------------------------------------------------- Wound Assessment Details Patient Name: [name] Otto. Date of Service: 08/22/2016 10:45 AM Medical Record Number: [id number] Patient Account Number: [id number] Date of Birth/Sex: Dec 13, 1945 (70 y.o. Male) Treating RN: Phillis Haggis Primary Care Physician: Oretha Milch Other Clinician: Referring Physician: Oretha Milch Treating Physician/Extender: Ardath Sax Weeks in Treatment: 33 Wound Status Wound Number: 10 Primary Lymphedema Etiology: Wound Location: Right Lower Leg - Anterior Secondary Diabetic Wound/Ulcer of the Lower Wounding Event: Blister Etiology: Extremity Date Acquired: 08/22/2016 Wound Open Weeks Of Treatment: 0 Status: Clustered Wound: No Comorbid Cataracts, Chronic Obstructive History: Pulmonary Disease (COPD), Sleep Apnea, Angina, Congestive  Heart Failure, Hypertension, Type II Diabetes, Gout, Osteoarthritis, Neuropathy Photos Photo Uploaded By: Alejandro Mulling on 08/22/2016 16:44:01 Wound Measurements Length: (cm) 0.5 Width: (cm) 3 Depth: (cm) 0.1 Area: (cm) 1.178 Volume: (cm) 0.118 % Reduction in Area: % Reduction in Volume: Epithelialization: None Tunneling: No Undermining: No Wound Description Classification: Partial Thickness Diabetic Severity [address]): Grade 1 Wound Margin: Distinct, outline attache Exudate Amount: Medium Exudate Type: Serous Exudate Color: amber Steward, Nicoli V. ([id number]) Foul Odor After Cleansing: No d Wound Bed Granulation Amount: Large (67-100%) Exposed Structure Granulation Quality: Red Fascia Exposed: No Necrotic Amount: None Present (0%) Fat Layer Exposed: No Tendon Exposed:  No Muscle Exposed: No Joint Exposed: No Bone Exposed: No Limited to Skin Breakdown Periwound Skin Texture Texture Color No Abnormalities Noted: No No Abnormalities Noted: No Localized Edema: Yes Erythema: Yes Erythema Location: Circumferential Moisture No Abnormalities Noted: No Temperature / Pain Moist: Yes Temperature: No Abnormality Tenderness on Palpation: Yes Wound Preparation Ulcer Cleansing: Other: soap and water, Topical Anesthetic Applied: None Treatment Notes Wound #10 (Right, Anterior Lower Leg) 1. Cleansed with: Clean wound with Normal Saline Cleanse wound with antibacterial soap and water 2. Anesthetic Topical Lidocaine 4% cream to wound bed prior to debridement 3. Peri-wound Care: Barrier cream 4. Dressing Applied: Prisma Ag 5. Secondary Dressing Applied ABD Pad 7. Secured with Tape 3 Layer Compression System - Right Lower Extremity Notes unna to anchor Electronic Signature(s) Signed: 08/26/2016 4:03:46 PM By: Alejandro Mulling Entered By: Alejandro Mulling on 08/22/2016 11:20:24 [name] Otto ([id number]) [name], [place] VMarland [name]  ([id number]) -------------------------------------------------------------------------------- Wound Assessment Details Patient Name: [name] Otto. Date of Service: 08/22/2016 10:45 AM Medical Record Number: [id number] Patient Account Number: [id number] Date of Birth/Sex: 22-Oct-1946 (70 y.o. Male) Treating RN: Phillis Haggis Primary Care Physician: Oretha Milch Other Clinician: Referring Physician: Oretha Milch Treating Physician/Extender: Ardath Sax Weeks in Treatment: 33 Wound Status Wound Number: 3 Primary Pressure Ulcer Etiology: Wound Location: Right Calcaneus Wound Open Wounding Event: Pressure Injury Status: Date Acquired: 10/31/2015 Comorbid Cataracts, Chronic Obstructive Weeks Of Treatment: 33 History: Pulmonary Disease (COPD), Sleep Clustered Wound: No Apnea, Angina, Congestive Heart Failure, Hypertension, Type II Diabetes, Gout, Osteoarthritis, Neuropathy Photos Photo Uploaded By: Alejandro Mulling on 08/22/2016 16:44:14 Wound Measurements Length: (cm) 1.2 Width: (cm) 2.8 Depth: (cm) 0.1 Area: (cm) 2.639 Volume: (cm) 0.264 % Reduction in Area: 62.7% % Reduction in Volume: 81.3% Epithelialization: Medium (34-66%) Tunneling: No Undermining: No Wound Description Classification: Category/Stage III Foul Odor Diabetic Severity ([address]): Grade 1 Wound Margin: Flat and Intact Exudate Amount: Large Exudate Type: Serous Exudate Color: amber After Cleansing: No Wound Bed Granulation Amount: Medium (34-66%) Exposed Structure [name], [name] ([id number]) Granulation Quality: Red, Pink Fascia Exposed: No Necrotic Amount: Medium (34-66%) Fat Layer Exposed: No Necrotic Quality: Adherent Slough Tendon Exposed: No Muscle Exposed: No Joint Exposed: No Bone Exposed: No Limited to Skin Breakdown Periwound Skin Texture Texture Color No Abnormalities Noted: No No Abnormalities Noted: No Localized Edema: Yes Erythema: Yes Erythema Location:  Circumferential Moisture No Abnormalities Noted: No Temperature / Pain Moist: Yes Temperature: No Abnormality Tenderness on Palpation: Yes Wound Preparation Ulcer Cleansing: Other: soap and water, Topical Anesthetic Applied: Other: lidocaine 4%, Treatment Notes Wound #3 (Right Calcaneus) 1. Cleansed with: Clean wound with Normal Saline Cleanse wound with antibacterial soap and water 2. Anesthetic Topical Lidocaine 4% cream to wound bed prior to debridement 3. Peri-wound Care: Barrier cream 4. Dressing Applied: Prisma Ag 5. Secondary Dressing Applied Dry Gauze Kerlix/Conform 7. Secured with Tape Notes heel cup Electronic Signature(s) Signed: 08/26/2016 4:03:46 PM By: Alejandro Mulling Entered By: Alejandro Mulling on 08/22/2016 11:22:19 [name] Otto ([id number]) -------------------------------------------------------------------------------- Wound Assessment Details Patient Name: [name] Otto. Date of Service: 08/22/2016 10:45 AM Medical Record Number: [id number] Patient Account Number: [id number] Date of Birth/Sex: 1946/10/22 (70 y.o. Male) Treating RN: Phillis Haggis Primary Care Physician: Oretha Milch Other Clinician: Referring Physician: Oretha Milch Treating Physician/Extender: Ardath Sax Weeks in Treatment: 33 Wound Status Wound Number: 9 Primary Diabetic Wound/Ulcer of the Lower Etiology: Extremity Wound Location: Left Lower Leg - Anterior Secondary Venous Leg Ulcer Wounding Event: Gradually Appeared Etiology: Date Acquired: 08/09/2016 Wound Open Weeks Of Treatment: 1  Status: Clustered Wound: Yes Comorbid Cataracts, Chronic Obstructive History: Pulmonary Disease (COPD), Sleep Apnea, Angina, Congestive Heart Failure, Hypertension, Type II Diabetes, Gout, Osteoarthritis, Neuropathy Photos Photo Uploaded By: Alejandro Mulling on 08/22/2016 16:44:27 Wound Measurements Length: (cm) 15 Width: (cm) 8.5 Depth: (cm) 0.1 Area: (cm)  100.138 Volume: (cm) 10.014 % Reduction in Area: 0% % Reduction in Volume: 0% Tunneling: No Undermining: No Wound Description Classification: Grade 2 Foul Odor Aft Wound Margin: Flat and Intact Exudate Amount: Large Exudate Type: Serosanguineous Exudate Color: red, brown er Cleansing: No Wound Bed [name], [name] ([id number]) Granulation Amount: Medium (34-66%) Exposed Structure Granulation Quality: Red, Friable Fascia Exposed: No Necrotic Amount: Medium (34-66%) Fat Layer Exposed: No Necrotic Quality: Adherent Slough Tendon Exposed: No Muscle Exposed: No Joint Exposed: No Bone Exposed: No Limited to Skin Breakdown Periwound Skin Texture Texture Color No Abnormalities Noted: No No Abnormalities Noted: No Callus: No Atrophie Blanche: No Crepitus: No Cyanosis: No Excoriation: No Ecchymosis: No Fluctuance: No Erythema: No Friable: No Hemosiderin Staining: Yes Induration: Yes Mottled: No Localized Edema: No Pallor: No Rash: No Rubor: No Scarring: No Moisture No Abnormalities Noted: No Dry / Scaly: No Maceration: No Moist: Yes Wound Preparation Ulcer Cleansing: Rinsed/Irrigated with Saline Topical Anesthetic Applied: None Treatment Notes Wound #9 (Left, Anterior Lower Leg) 1. Cleansed with: Clean wound with Normal Saline Cleanse wound with antibacterial soap and water 2. Anesthetic Topical Lidocaine 4% cream to wound bed prior to debridement 3. Peri-wound Care: Barrier cream 4. Dressing Applied: Prisma Ag 5. Secondary Dressing Applied ABD Pad 7. Secured with Tape [name], [name] ([id number]) 3 Layer Compression System - Right Lower Extremity Notes unna to anchor Electronic Signature(s) Signed: 08/26/2016 4:03:46 PM By: Alejandro Mulling Entered By: Alejandro Mulling on 08/22/2016 11:21:28 [name] Otto ([id number]) -------------------------------------------------------------------------------- Vitals Details Patient Name: [name] Otto. Date of Service: 08/22/2016 10:45 AM Medical Record Number: [id number] Patient Account Number: [id number] Date of Birth/Sex: 12-10-1945 (70 y.o. Male) Treating RN: Phillis Haggis Primary Care Physician: Oretha Milch Other Clinician: Referring Physician: Oretha Milch Treating Physician/Extender: Elayne Snare in Treatment: 75 Vital Signs Time Taken: 10:51 Temperature (F): 97.6 Height (in): 70 Pulse (bpm): 66 Weight (lbs): 235 Respiratory Rate (breaths/min): 18 Body Mass Index (BMI): 33.7 Blood Pressure (mmHg): 109/61 Reference Range: 80 - 120 mg / dl Electronic Signature(s) Signed: 08/26/2016 4:03:46 PM By: Alejandro Mulling Entered By: Alejandro Mulling on 08/22/2016 10:52:16

## 2016-08-29 ENCOUNTER — Encounter: Payer: Medicare Other | Admitting: Surgery

## 2016-08-29 DIAGNOSIS — E11621 Type 2 diabetes mellitus with foot ulcer: Secondary | ICD-10-CM | POA: Diagnosis not present

## 2016-08-30 NOTE — Progress Notes (Signed)
[name], [name] ([id number]) Visit Report for 08/29/2016 Chief Complaint Document Details Patient Name: [name], [name] Date of Service: 08/29/2016 3:15 PM Medical Record Number: [id number] Patient Account Number: [id number] Date of Birth/Sex: 08/26/1946 (70 y.o. Male) Treating RN: Phillis Haggis Primary Care Physician: Oretha Milch Other Clinician: Referring Physician: Oretha Milch Treating Physician/Extender: Rudene Re in Treatment: 37 Information Obtained from: Patient Chief Complaint Patient is at the clinic for treatment of an open pressure ulcer the left upper thigh and gluteal region and the right heel with bilateral swelling of the legs all of it which is going on for over a year Electronic Signature(s) Signed: 08/29/2016 3:56:02 PM By: Evlyn Kanner MD, FACS Entered By: Evlyn Kanner on 08/29/2016 15:56:02 [name] ([id number]) -------------------------------------------------------------------------------- HPI Details Patient Name: [name] Date of Service: 08/29/2016 3:15 PM Medical Record Number: [id number] Patient Account Number: [id number] Date of Birth/Sex: 1946-04-28 (70 y.o. Male) Treating RN: Phillis Haggis Primary Care Physician: Oretha Milch Other Clinician: Referring Physician: Oretha Milch Treating Physician/Extender: Rudene Re in Treatment: 50 History of Present Illness Location: ulcerated area on the right heel, left gluteal region and thigh and then bilateral lower extremities Quality: Patient reports experiencing a sharp pain to affected area(s). Severity: Patient states wound are getting worse. Duration: Patient has had the wound for > 12 months prior to seeking treatment at the wound center Timing: Pain in wound is constant (hurts all the time) Context: The wound appeared gradually over time Modifying Factors: Other treatment(s) tried include: he sees his heart doctor and his primary care doctor Associated Signs  and Symptoms: patient has not been able to walk for over a year now HPI Description: 70 year old gentleman with a known history of hypertension, diabetes, obstructive sleep apnea, COPD, diastolic CHF, coronary artery disease was admitted to the hospital with sepsis from an ulcer of the right heel and was treated there in October 2016. he also has chronic bilateral lower extremity edema and lymphedema. He had received vancomycin, Zosyn and at that stage and x-rays showed hardware in the right ankle but no evidence of osteomyelitis. he was a former smoker. he was also treated with Augmentin orally for 10 days. He is either bedbound or wheelchair-bound and does not ambulate by himself. 01/19/2016 -- he has not been seen here for 3 weeks and this was because he was admitted to [name] between 223 and 01/08/2016 for sepsis, UTI and pneumonia involving the left lung. he was treated with IV vancomycin and Zosyn and then meropenem. Was discharged home on oral Bactrim for 2 weeks none of his vascular test or x-rays were done and we will reorder these. 01/26/2016 -- he has not yet done the x-ray of his foot and is vascular tests are still pending. I have asked him to work on these with his nursing home staff. Addendum: he has got an x-ray of the right foot done which shows that his osteopenia but no specific ostial lysis or abnormal periosteal reaction. Final impression was degenerative changes with dorsal foot soft tissue swelling. 02/16/2016 -- lower extremity arterial duplex examination shows a 50-99% stenosis of the right tibioperoneal trunk. He had biphasic flow in the right SFA, popliteal and tibioperoneal trunk. Left-sided he had triphasic flow throughout 02/29/2016 -- he is awaiting his vascular opinion with Dr. Gilda Crease which is to be done on April 24 03/08/2016 -- he has not kept his appointment with Dr. Gilda Crease on April 24 and does not seem to know what happened  about  this. it's difficult to gauge whether he is in full control of his mental faculties as at times he is extremely rude to the nursing staff. 03/22/2016 -- the patient was seen by Dr. 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 Recommended that the patient had severe atherosclerotic changes of both lower extremities associated with ulceration and tissue loss of the foot. This is a limb threatening ischemia and the patient was recommended to undergo Wimberly, Staci V. ([id number]) angiography of the lower extremity with a hope for intervention for limb salvage. Patient agreed and will proceed to angiography. He was admitted to the hospital between May 2 and 03/15/2016 - he underwent induction of a catheter into his right lower extremity and third order catheter placement with contrast injection to the right lower extremity for distal runoff. Percutaneous transluminal angioplasty of the right superficial femoral and popliteal arteries were done. He also had a right peroneal angioplasty. Patient had a postoperative hematoma and was admitted for observation and in the next 24 hours he was very agitated and combative and had to be seen by psychiatric. He had a follow-up with Dr. Gilda Crease in 3 weeks. 04/18/2016 -- notes reviewed from the vascular office where he was seen for his postop visit after the PTA of the right SFA and popliteal arteries on 03/12/2016. He underwent an ABI which showed right ABI to be more than 1.3 and left to be more than 1 more than 1.3, great toe and PPG waveforms are decreased bilaterally. No additional intervention indicated at this time and the patient was to follow-up in 3 months with an ABI and bilateral lower extremity duplex study. 05/02/2016 -- he complains that his compression stockings are causing him a lot of discomfort and he is not happy wearing  them. 05/30/2016 -- the swelling of both legs has increased again and he has had blisters which have opened out into ulcerations. Electronic Signature(s) Signed: 08/29/2016 3:56:10 PM By: Evlyn Kanner MD, FACS Entered By: Evlyn Kanner on 08/29/2016 15:56:09 [name] ([id number]) -------------------------------------------------------------------------------- Physical Exam Details Patient Name: [name] Date of Service: 08/29/2016 3:15 PM Medical Record Number: [id number] Patient Account Number: [id number] Date of Birth/Sex: Aug 30, 1946 (70 y.o. Male) Treating RN: Phillis Haggis Primary Care Physician: Oretha Milch Other Clinician: Referring Physician: Oretha Milch Treating Physician/Extender: Rudene Re in Treatment: 34 Constitutional . Pulse regular. Respirations normal and unlabored. Afebrile. . Eyes Nonicteric. Reactive to light. Ears, Nose, Mouth, and Throat Lips, teeth, and gums WNL.Marland Kitchen Moist mucosa without lesions. Neck supple and nontender. No palpable supraclavicular or cervical adenopathy. Normal sized without goiter. Respiratory WNL. No retractions.. Cardiovascular Pedal Pulses WNL. No clubbing, cyanosis or edema. Lymphatic No adneopathy. No adenopathy. No adenopathy. Musculoskeletal Adexa without tenderness or enlargement.. Digits and nails w/o clubbing, cyanosis, infection, petechiae, ischemia, or inflammatory conditions.. Integumentary (Hair, Skin) No suspicious lesions. No crepitus or fluctuance. No peri-wound warmth or erythema. No masses.Marland Kitchen Psychiatric Judgement and insight Intact.. No evidence of depression, anxiety, or agitation.. Notes lymphedema both lower extremities looks very good and he has minimal wheezing from his legs. The ulcer on the right medial calcaneum is now looking clean and after washing it off with moist saline gauze it has healthy granulation tissue. No sharp debridement was required today. Electronic  Signature(s) Signed: 08/29/2016 3:56:57 PM By: Evlyn Kanner MD, FACS Entered By: Evlyn Kanner on 08/29/2016 15:56:56 [name], [name] ([id number]) --------------------------------------------------------------------------------  Physician Orders Details Patient Name: KEISTON, MANLEY. Date of Service: 08/29/2016 3:15 PM Medical Record Number: [id number] Patient Account Number: [id number] Date of Birth/Sex: [birth year] (70 y.o. Male) Treating RN: [name], RN, [name], [place] Sink Primary Care Physician: Oretha Milch Other Clinician: Referring Physician: Oretha Milch Treating Physician/Extender: Rudene Re in Treatment: 102 Verbal / Phone Orders: Yes Clinician: [name], RN, [name], Rita Read Back and Verified: Yes Diagnosis Coding Wound Cleansing Wound #10 Right,Anterior Lower Leg o No tub bath. - only sink bath or bed bath Wound #3 Right Calcaneus o No tub bath. - only sink bath or bed bath Wound #9 Left,Anterior Lower Leg o No tub bath. - only sink bath or bed bath Anesthetic Wound #10 Right,Anterior Lower Leg o Topical Lidocaine 4% cream applied to wound bed prior to debridement - for clinic use only Wound #3 Right Calcaneus o Topical Lidocaine 4% cream applied to wound bed prior to debridement - for clinic use only Wound #9 Left,Anterior Lower Leg o Topical Lidocaine 4% cream applied to wound bed prior to debridement - for clinic use only Skin Barriers/Peri-Wound Care Wound #10 Right,Anterior Lower Leg o Barrier cream - around the heel wound to reddened and irritated areas Wound #3 Right Calcaneus o Barrier cream - around the heel wound to reddened and irritated areas Wound #9 Left,Anterior Lower Leg o Barrier cream - around the heel wound to reddened and irritated areas Primary Wound Dressing Wound #10 Right,Anterior Lower Leg o Sorbalgon Ag Wound #3 Right Calcaneus o Sorbalgon Ag Shiplett, Deanta V. ([id number]) Wound #9 Left,Anterior Lower Leg o Sorbalgon  Ag Secondary Dressing Wound #3 Right Calcaneus o Dry Gauze - Heel cups Dressing Change Frequency Wound #10 Right,Anterior Lower Leg o Change dressing every week Wound #3 Right Calcaneus o Change dressing every other day. - unless saturated or soiled Wound #9 Left,Anterior Lower Leg o Change dressing every week Follow-up Appointments Wound #3 Right Calcaneus o Return Appointment in 1 week. Edema Control o 3 Layer Compression System - Bilateral o Other: - unna to anchor Off-Loading Wound #3 Right Calcaneus o Turn and reposition every 2 hours o Other: - sage boots at night and float heel when lying in bed Additional Orders / Instructions Wound #3 Right Calcaneus o Increase protein intake. Medications-please add to medication list. Wound #3 Right Calcaneus o Other: - Vitamin C, Vitamin A, Zinc, multivitamin Electronic Signature(s) Signed: 08/29/2016 3:58:39 PM By: Evlyn Kanner MD, FACS Signed: 08/29/2016 5:42:32 PM By: Elpidio Eric [name], RN Entered By: Elpidio Eric on 08/29/2016 15:54:25 [name] ([id number]) [name], [name] VMarland Kitchen ([id number]) -------------------------------------------------------------------------------- Problem List Details Patient Name: [name], [name] Date of Service: 08/29/2016 3:15 PM Medical Record Number: [id number] Patient Account Number: [id number] Date of Birth/Sex: 1946/09/15 (70 y.o. Male) Treating RN: Phillis Haggis Primary Care Physician: Oretha Milch Other Clinician: Referring Physician: Oretha Milch Treating Physician/Extender: Rudene Re in Treatment: 67 Active Problems ICD-10 Encounter Code Description Active Date Diagnosis E11.621 Type 2 diabetes mellitus with foot ulcer 01/01/2016 Yes L89.613 Pressure ulcer of right heel, stage 3 01/01/2016 Yes E66.01 Morbid (severe) obesity due to excess calories 01/01/2016 Yes I89.0 Lymphedema, not elsewhere classified 01/01/2016 Yes M70.871 Other soft tissue  disorders related to use, overuse and 01/19/2016 Yes pressure, right ankle and foot 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/16/2016 Yes ulceration of heel and midfoot Inactive Problems Resolved Problems ICD-10 Code Description Active Date Resolved Date L89.322 Pressure ulcer of left buttock, stage 2 01/01/2016 01/01/2016 Electronic Signature(s) Signed: 08/29/2016 3:55:49  PM By: Evlyn Kanner MD, FACS Entered By: Evlyn Kanner on 08/29/2016 15:55:48 [name] ([id number]) [name], Cainen Seth [name] ([id number]) -------------------------------------------------------------------------------- Progress Note Details Patient Name: [name] Date of Service: 08/29/2016 3:15 PM Medical Record Number: [id number] Patient Account Number: [id number] Date of Birth/Sex: 1946/05/29 (70 y.o. Male) Treating RN: Phillis Haggis Primary Care Physician: Oretha Milch Other Clinician: Referring Physician: Oretha Milch Treating Physician/Extender: Rudene Re in Treatment: 76 Subjective Chief Complaint Information obtained from Patient Patient is at the clinic for treatment of an open pressure ulcer the left upper thigh and gluteal region and the right heel with bilateral swelling of the legs all of it which is going on for over a year History of Present Illness (HPI) The following HPI elements were documented for the patient's wound: Location: ulcerated area on the right heel, left gluteal region and thigh and then bilateral lower extremities Quality: Patient reports experiencing a sharp pain to affected area(s). Severity: Patient states wound are getting worse. Duration: Patient has had the wound for > 12 months prior to seeking treatment at the wound center Timing: Pain in wound is constant (hurts all the time) Context: The wound appeared gradually over time Modifying Factors: Other treatment(s) tried include: he sees his heart doctor and his primary care  doctor Associated Signs and Symptoms: patient has not been able to walk for over a year now 70 year old gentleman with a known history of hypertension, diabetes, obstructive sleep apnea, COPD, diastolic CHF, coronary artery disease was admitted to the hospital with sepsis from an ulcer of the right heel and was treated there in October 2016. he also has chronic bilateral lower extremity edema and lymphedema. He had received vancomycin, Zosyn and at that stage and x-rays showed hardware in the right ankle but no evidence of osteomyelitis. he was a former smoker. he was also treated with Augmentin orally for 10 days. He is either bedbound or wheelchair-bound and does not ambulate by himself. 01/19/2016 -- he has not been seen here for 3 weeks and this was because he was admitted to [facility] between 223 and 01/08/2016 for sepsis, UTI and pneumonia involving the left lung. he was treated with IV vancomycin and Zosyn and then meropenem. Was discharged home on oral Bactrim for 2 weeks none of his vascular test or x-rays were done and we will reorder these. 01/26/2016 -- he has not yet done the x-ray of his foot and is vascular tests are still pending. I have asked him to work on these with his nursing home staff. Addendum: he has got an x-ray of the right foot done which shows that his osteopenia but no specific ostial lysis or abnormal periosteal reaction. Final impression was degenerative changes with dorsal foot soft tissue swelling. 02/16/2016 -- lower extremity arterial duplex examination shows a 50-99% stenosis of the right tibioperoneal trunk. He had biphasic flow in the right SFA, popliteal and tibioperoneal trunk. Left-sided he had triphasic flow throughout [name], [name] ([id number]) 02/29/2016 -- he is awaiting his vascular opinion with Dr. Gilda Crease which is to be done on April 24 03/08/2016 -- he has not kept his appointment with Dr. Gilda Crease on April 24 and does  not seem to know what happened about this. it's difficult to gauge whether he is in full control of his mental faculties as at times he is extremely rude to the nursing staff. 03/22/2016 -- the patient was seen by Dr. 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 Recommended that the patient had severe atherosclerotic changes of both lower extremities associated with ulceration and tissue loss of the foot. This is a limb threatening ischemia and the patient was recommended to undergo angiography of the lower extremity with a hope for intervention for limb salvage. Patient agreed and will proceed to angiography. He was admitted to the hospital between May 2 and 03/15/2016 - he underwent induction of a catheter into his right lower extremity and third order catheter placement with contrast injection to the right lower extremity for distal runoff. Percutaneous transluminal angioplasty of the right superficial femoral and popliteal arteries were done. He also had a right peroneal angioplasty. Patient had a postoperative hematoma and was admitted for observation and in the next 24 hours he was very agitated and combative and had to be seen by psychiatric. He had a follow-up with Dr. Gilda Crease in 3 weeks. 04/18/2016 -- notes reviewed from the vascular office where he was seen for his postop visit after the PTA of the right SFA and popliteal arteries on 03/12/2016. He underwent an ABI which showed right ABI to be more than 1.3 and left to be more than 1 more than 1.3, great toe and PPG waveforms are decreased bilaterally. No additional intervention indicated at this time and the patient was to follow-up in 3 months with an ABI and bilateral lower extremity duplex study. 05/02/2016 -- he complains that his compression stockings are causing him a lot of discomfort and he is not happy wearing  them. 05/30/2016 -- the swelling of both legs has increased again and he has had blisters which have opened out into ulcerations. Objective Constitutional Pulse regular. Respirations normal and unlabored. Afebrile. Vitals Time Taken: 3:25 PM, Height: 70 in, Weight: 235 lbs, BMI: 33.7, Temperature: 97.7 F, Pulse: 65 bpm, Respiratory Rate: 18 breaths/min, Blood Pressure: 127/64 mmHg. Eyes Nonicteric. Reactive to light. Ears, Nose, Mouth, and Throat Lips, teeth, and gums WNL.Marland Kitchen Moist mucosa without lesions. Neck Glowacki, Rohin V. ([id number]) supple and nontender. No palpable supraclavicular or cervical adenopathy. Normal sized without goiter. Respiratory WNL. No retractions.. Cardiovascular Pedal Pulses WNL. No clubbing, cyanosis or edema. Lymphatic No adneopathy. No adenopathy. No adenopathy. Musculoskeletal Adexa without tenderness or enlargement.. Digits and nails w/o clubbing, cyanosis, infection, petechiae, ischemia, or inflammatory conditions.Marland Kitchen Psychiatric Judgement and insight Intact.. No evidence of depression, anxiety, or agitation.. General Notes: lymphedema both lower extremities looks very good and he has minimal wheezing from his legs. The ulcer on the right medial calcaneum is now looking clean and after washing it off with moist saline gauze it has healthy granulation tissue. No sharp debridement was required today. Integumentary (Hair, Skin) No suspicious lesions. No crepitus or fluctuance. No peri-wound warmth or erythema. No masses.. Wound #10 status is Open. Original cause of wound was Blister. The wound is located on the Right,Anterior Lower Leg. The wound measures 0.1cm length x 0.1cm width x 0.1cm depth; 0.008cm^2 area and 0.001cm^3 volume. The wound is limited to skin breakdown. There is no tunneling or undermining noted. There is a medium amount of serous drainage noted. The wound margin is distinct with the outline attached to the wound base. There is large  (67-100%) red granulation within the wound bed. There is no necrotic tissue within the wound bed. The periwound skin appearance exhibited: Localized Edema, Moist, Erythema. The surrounding wound skin color is noted with erythema which is circumferential. Periwound temperature was noted as  No Abnormality. The periwound has tenderness on palpation. Wound #3 status is Open. Original cause of wound was Pressure Injury. The wound is located on the Right Calcaneus. The wound measures 1cm length x 2.5cm width x 0.1cm depth; 1.963cm^2 area and 0.196cm^3 volume. The wound is limited to skin breakdown. There is no tunneling or undermining noted. There is a large amount of serous drainage noted. The wound margin is flat and intact. There is medium (34-66%) red, pink granulation within the wound bed. There is a medium (34-66%) amount of necrotic tissue within the wound bed including Adherent Slough. The periwound skin appearance exhibited: Localized Edema, Moist, Erythema. The surrounding wound skin color is noted with erythema which is circumferential. Periwound temperature was noted as No Abnormality. The periwound has tenderness on palpation. Wound #9 status is Open. Original cause of wound was Gradually Appeared. The wound is located on the Left,Anterior Lower Leg. The wound measures 12cm length x 8.5cm width x 0.1cm depth; 80.111cm^2 area and 8.011cm^3 volume. The wound is limited to skin breakdown. There is no tunneling or undermining noted. There is a large amount of serosanguineous drainage noted. The wound margin is flat and intact. There is medium (34-66%) red, friable granulation within the wound bed. There is a medium (34-66%) amount of necrotic tissue within the wound bed including Adherent Slough. The periwound skin appearance [name], [name] ([id number]) exhibited: Induration, Moist, Hemosiderin Staining. The periwound skin appearance did not exhibit: Callus, Crepitus, Excoriation,  Fluctuance, Friable, Localized Edema, Rash, Scarring, Dry/Scaly, Maceration, Atrophie Blanche, Cyanosis, Ecchymosis, Mottled, Pallor, Rubor, Erythema. Assessment Active Problems ICD-10 E11.621 - Type 2 diabetes mellitus with foot ulcer L89.613 - Pressure ulcer of right heel, stage 3 E66.01 - Morbid (severe) obesity due to excess calories I89.0 - Lymphedema, not elsewhere classified M70.871 - Other soft tissue disorders related to use, overuse and pressure, right ankle and foot I70.234 - Atherosclerosis of native arteries of right leg with ulceration of heel and midfoot Plan Wound Cleansing: Wound #10 Right,Anterior Lower Leg: No tub bath. - only sink bath or bed bath Wound #3 Right Calcaneus: No tub bath. - only sink bath or bed bath Wound #9 Left,Anterior Lower Leg: No tub bath. - only sink bath or bed bath Anesthetic: Wound #10 Right,Anterior Lower Leg: Topical Lidocaine 4% cream applied to wound bed prior to debridement - for clinic use only Wound #3 Right Calcaneus: Topical Lidocaine 4% cream applied to wound bed prior to debridement - for clinic use only Wound #9 Left,Anterior Lower Leg: Topical Lidocaine 4% cream applied to wound bed prior to debridement - for clinic use only Skin Barriers/Peri-Wound Care: Wound #10 Right,Anterior Lower Leg: Barrier cream - around the heel wound to reddened and irritated areas Wound #3 Right Calcaneus: Barrier cream - around the heel wound to reddened and irritated areas Wound #9 Left,Anterior Lower Leg: Barrier cream - around the heel wound to reddened and irritated areas Primary Wound Dressing: Wound #10 Right,Anterior Lower Leg: Sorbalgon Ag [name] GuyLINDLEY, [name] ([id number]) Wound #3 Right Calcaneus: Sorbalgon Ag Wound #9 Left,Anterior Lower Leg: Sorbalgon Ag Secondary Dressing: Wound #3 Right Calcaneus: Dry Gauze - Heel cups Dressing Change Frequency: Wound #10 Right,Anterior Lower Leg: Change dressing every week Wound #3 Right  Calcaneus: Change dressing every other day. - unless saturated or soiled Wound #9 Left,Anterior Lower Leg: Change dressing every week Follow-up Appointments: Wound #3 Right Calcaneus: Return Appointment in 1 week. Edema Control: 3 Layer Compression System - Bilateral Other: - unna to anchor Off-Loading: Wound #  3 Right Calcaneus: Turn and reposition every 2 hours Other: - sage boots at night and float heel when lying in bed Additional Orders / Instructions: Wound #3 Right Calcaneus: Increase protein intake. Medications-please add to medication list.: Wound #3 Right Calcaneus: Other: - Vitamin C, Vitamin A, Zinc, multivitamin I have recommended: 1. Prisma Ag and then cover it with Drawtex to his right heel and may be change daily if needed 2. Constant off loading and also using Sage boot. 3. we will use 20-30 mmcompression stockings for both lower extremities. 4. to be very careful about his salt intake and have recommended a low-salt diet 5. High-protein diet and multivitamins including vitamin C and zinc. 6. benefit from lymphedema pumps to be used bilaterally and I have made my recommendations as above in my assessment. The vendor is awaiting delivery of this Electronic Signature(s) Signed: 08/29/2016 3:57:48 PM By: Evlyn Kanner MD, FACS Entered By: Evlyn Kanner on 08/29/2016 15:57:48 [name] ([id number]) [name], [name] ([id number]) -------------------------------------------------------------------------------- SuperBill Details Patient Name: [name] Date of Service: 08/29/2016 Medical Record Number: [id number] Patient Account Number: [id number] Date of Birth/Sex: 07-Jul-1946 (70 y.o. 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

## 2016-08-30 NOTE — Progress Notes (Signed)
BYRON, PEACOCK ([id number]) Visit Report for 08/29/2016 Arrival Information Details Patient Name: [name], [name] Date of Service: 08/29/2016 3:15 PM Medical Record Number: [id number] Patient Account Number: [id number] Date of Birth/Sex: 1946/02/17 (70 y.o. Male) Treating RN: Phillis Haggis Primary Care Physician: Oretha Milch Other Clinician: Referring Physician: Oretha Milch Treating Physician/Extender: Rudene Re in Treatment: 43 Visit Information History Since Last Visit All ordered tests and consults were completed: No Patient Arrived: Wheel Chair Added or deleted any medications: No Arrival Time: 15:21 Any new allergies or adverse reactions: No Accompanied By: self Had a fall or experienced change in No Transfer Assistance: [employer] activities of daily living that may affect Patient Lift risk of falls: Patient Identification Verified: Yes Signs or symptoms of abuse/neglect since last No Secondary Verification Process Yes visito Completed: Hospitalized since last visit: No Patient Requires Transmission- No Pain Present Now: No Based Precautions: Patient Has Alerts: Yes Patient Alerts: DM II Electronic Signature(s) Signed: 08/29/2016 5:28:24 PM By: Alejandro Mulling Entered By: Alejandro Mulling on 08/29/2016 15:22:10 [name] ([id number]) -------------------------------------------------------------------------------- Encounter Discharge Information Details Patient Name: [name] Date of Service: 08/29/2016 3:15 PM Medical Record Number: [id number] Patient Account Number: [id number] Date of Birth/Sex: 17-Jan-1946 (70 y.o. Male) Treating RN: Phillis Haggis Primary Care Physician: Oretha Milch Other Clinician: Referring Physician: Oretha Milch Treating Physician/Extender: Rudene Re in Treatment: 2 Encounter Discharge Information Items Discharge Pain Level: 0 Discharge Condition: Stable Ambulatory Status: Wheelchair Discharge  Destination: Nursing Home Transportation: Other Accompanied By: self Schedule Follow-up Appointment: Yes Medication Reconciliation completed and provided to Patient/Care Yes Kona Lover: Provided on Clinical Summary of Care: 08/29/2016 Form Type Recipient Paper Patient EL Electronic Signature(s) Signed: 08/29/2016 5:28:24 PM By: Alejandro Mulling Previous Signature: 08/29/2016 4:10:37 PM Version By: Gwenlyn Perking Entered By: Alejandro Mulling on 08/29/2016 16:20:56 Ruddy, Aitan Seth Bake ([id number]) -------------------------------------------------------------------------------- Lower Extremity Assessment Details Patient Name: [name] Date of Service: 08/29/2016 3:15 PM Medical Record Number: [id number] Patient Account Number: [id number] Date of Birth/Sex: Jul 02, 1946 (70 y.o. Male) Treating RN: Phillis Haggis Primary Care Physician: Oretha Milch Other Clinician: Referring Physician: Oretha Milch Treating Physician/Extender: Rudene Re in Treatment: 34 Edema Assessment Assessed: [Left: No] [Right: No] E[Left: dema] [Right: :] Calf Left: Right: Point of Measurement: 34 cm From Medial Instep 38.8 cm 34.8 cm Ankle Left: Right: Point of Measurement: 12 cm From Medial Instep 25.8 cm 23.6 cm Vascular Assessment Pulses: Posterior Tibial Dorsalis Pedis Palpable: [Left:Yes] [Right:Yes] Extremity colors, hair growth, and conditions: Extremity Color: [Left:Hyperpigmented] [Right:Hyperpigmented] Temperature of Extremity: [Left:Warm] [Right:Warm] Capillary Refill: [Left:< 3 seconds] [Right:< 3 seconds] Toe Nail Assessment Left: Right: Thick: No No Discolored: Yes Yes Deformed: No No Improper Length and Hygiene: Yes Yes Electronic Signature(s) Signed: 08/29/2016 5:28:24 PM By: Alejandro Mulling Entered By: Alejandro Mulling on 08/29/2016 15:33:18 Piscitello, Rosendo Seth Bake ([id number]) -------------------------------------------------------------------------------- Multi Wound  Chart Details Patient Name: [name] Date of Service: 08/29/2016 3:15 PM Medical Record Number: [id number] Patient Account Number: [id number] Date of Birth/Sex: May 26, 1946 (70 y.o. Male) Treating RN: [name], RN, [name], [place] Sink Primary Care Physician: Oretha Milch Other Clinician: Referring Physician: Oretha Milch Treating Physician/Extender: Rudene Re in Treatment: 34 Vital Signs Height(in): 70 Pulse(bpm): 65 Weight(lbs): 235 Blood Pressure 127/64 (mmHg): Body Mass Index(BMI): 34 Temperature(F): 97.7 Respiratory Rate 18 (breaths/min): Photos: [10:No Photos] [3:No Photos] [9:No Photos] Wound Location: [10:Right Lower Leg - Anterior Right Calcaneus] [9:Left Lower Leg - Anterior] Wounding Event: [10:Blister] [3:Pressure Injury] [9:Gradually Appeared] Primary Etiology: [10:Lymphedema] [3:Pressure Ulcer] [9:Diabetic Wound/Ulcer of the Lower Extremity]  Secondary Etiology: [10: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] [9:Venous Leg Ulcer] Comorbid History: [10:Cataracts, Chronic Obstructive Pulmonary Disease (COPD), Sleep Disease (COPD), Sleep Disease (COPD), Sleep Apnea, Angina, Congestive Heart Failure, Congestive Heart Failure, Congestive Heart Failure, Hypertension, Type II Diabetes,  Gout, Osteoarthritis, Neuropathy Osteoarthritis, Neuropathy Osteoarthritis, Neuropathy] [3:Cataracts, Chronic Obstructive Pulmonary Apnea, Angina, Hypertension, Type II Diabetes, Gout,] [9:Cataracts, Chronic Obstructive Pulmonary Apnea, Angina,  Hypertension, Type II Diabetes, Gout,] Date Acquired: [10:08/22/2016] [3:10/31/2015] [9:08/09/2016] Weeks of Treatment: [10:1] [3:34] [9:2] Wound Status: [10:Open] [3:Open] [9:Open] Clustered Wound: [10:No] [3:No] [9:Yes] Measurements L x W x D 0.1x0.1x0.1 [3:1x2.5x0.1] [9:12x8.5x0.1] (cm) Area (cm) : [10:0.008] [3:1.963] [9:80.111] Volume (cm) : [10:0.001] [3:0.196] [9:8.011] % Reduction in Area: [10:99.30%] [3:72.20%] [9:20.00%] %  Reduction in Volume: 99.20% [3:86.10%] [9:20.00%] Classification: [10:Partial Thickness] [3:Category/Stage III] [9:Grade 2] HBO Classification: [10:Grade 1] [3:Grade 1] [9:N/A] Exudate Amount: [10:Medium] [3:Large] [9:Large] Exudate Type: [10:Serous] [3:Serous] [9:Serosanguineous] Exudate Color: [10:amber] [3:amber] [9:red, brown] Wound Margin: Distinct, outline attached Flat and Intact Flat and Intact Granulation Amount: Large (67-100%) Medium (34-66%) Medium (34-66%) Granulation Quality: Red Red, Pink Red, Friable Necrotic Amount: None Present (0%) Medium (34-66%) Medium (34-66%) Exposed Structures: Fascia: No Fascia: No Fascia: No Fat: No Fat: No Fat: No Tendon: No Tendon: No Tendon: No Muscle: No Muscle: No Muscle: No Joint: No Joint: No Joint: No Bone: No Bone: No Bone: No Limited to Skin Limited to Skin Limited to Skin Breakdown Breakdown Breakdown Epithelialization: None Medium (34-66%) N/A Periwound Skin Texture: Edema: Yes Edema: Yes Induration: Yes Edema: No Excoriation: No Callus: No Crepitus: No Fluctuance: No Friable: No Rash: No Scarring: No Periwound Skin Moist: Yes Moist: Yes Moist: Yes Moisture: Maceration: No Dry/Scaly: No Periwound Skin Color: Erythema: Yes Erythema: Yes Hemosiderin Staining: Yes Atrophie Blanche: No Cyanosis: No Ecchymosis: No Erythema: No Mottled: No Pallor: No Rubor: No Erythema Location: Circumferential Circumferential N/A Temperature: No Abnormality No Abnormality N/A Tenderness on Yes Yes No Palpation: Wound Preparation: Ulcer Cleansing: Other: Ulcer Cleansing: Other: Ulcer Cleansing: soap and water soap and water Rinsed/Irrigated with Saline Topical Anesthetic Topical Anesthetic Applied: None Applied: Other: lidocaine Topical Anesthetic 4% Applied: None Treatment Notes Electronic Signature(s) Signed: 08/29/2016 5:42:32 PM By: Elpidio EricAfful, Rita [name], RN Entered By: Elpidio EricAfful, Rita on 08/29/2016 15:52:20 [name] OttoLINDLEY,  [name] ([id number]) Clint GuyLINDLEY, [name] ([id number]) -------------------------------------------------------------------------------- Multi-Disciplinary Care Plan Details Patient Name: [name] OttoLINDLEY, Quindon V. Date of Service: 08/29/2016 3:15 PM Medical Record Number: [id number] Patient Account Number: [id number] Date of Birth/Sex: 09/17/1946 [age] y.o. Male) Treating RN: [name], RN, [name], [name] Primary Care Physician: Oretha MilchSMITH, [name] Other Clinician: Referring Physician: Oretha MilchSMITH, [name] Treating Physician/Extender: Rudene ReBritto, Errol Weeks in Treatment: 6234 Active Inactive Abuse / Safety / Falls / Self Care Management Nursing Diagnoses: Potential for falls Goals: Patient will remain injury free Date Initiated: 03/01/2016 Goal Status: Active Interventions: Assess fall risk on admission and as needed Notes: Nutrition Nursing Diagnoses: Imbalanced nutrition Goals: Patient/caregiver agrees to and verbalizes understanding of need to use nutritional supplements and/or vitamins as prescribed Date Initiated: 03/01/2016 Goal Status: Active Interventions: Assess patient nutrition upon admission and as needed per policy Notes: Orientation to the Wound Care Program Nursing Diagnoses: Knowledge deficit related to the wound healing center program Goals: Patient/caregiver will verbalize understanding of the [facility] [address]Program [name] OttoLINDLEY, [name] ([id number]) Date Initiated: 03/01/2016 Goal Status: Active Interventions: Provide education on orientation to the wound center Notes: Pain, Acute or Chronic Nursing Diagnoses: Pain, acute or chronic: actual or potential Potential alteration in comfort, pain Goals: Patient will verbalize adequate pain control  and receive pain control interventions during procedures as needed Date Initiated: 03/01/2016 Goal Status: Active Patient/caregiver will verbalize adequate pain control between visits Date Initiated: 03/01/2016 Goal Status: Active Interventions: Assess  comfort goal upon admission Complete pain assessment as per visit requirements Notes: Pressure Nursing Diagnoses: Knowledge deficit related to causes and risk factors for pressure ulcer development Knowledge deficit related to management of pressures ulcers Goals: Patient/caregiver will verbalize risk factors for pressure ulcer development Date Initiated: 03/01/2016 Goal Status: Active Interventions: Assess offloading mechanisms upon admission and as needed Notes: Wound/Skin Impairment Nursing Diagnoses: DUKE, WEISENSEL ([id number]) Impaired tissue integrity Goals: Ulcer/skin breakdown will have a volume reduction of 30% by week 4 Date Initiated: 03/01/2016 Goal Status: Active Ulcer/skin breakdown will have a volume reduction of 50% by week 8 Date Initiated: 03/01/2016 Goal Status: Active Ulcer/skin breakdown will have a volume reduction of 80% by week 12 Date Initiated: 03/01/2016 Goal Status: Active Interventions: Assess ulceration(s) every visit Notes: Electronic Signature(s) Signed: 08/29/2016 5:42:32 PM By: Elpidio Eric [name], RN Entered By: Elpidio Eric on 08/29/2016 15:52:09 Kubin, Connie Seth Bake ([id number]) -------------------------------------------------------------------------------- Pain Assessment Details Patient Name: [name] Date of Service: 08/29/2016 3:15 PM Medical Record Number: [id number] Patient Account Number: [id number] Date of Birth/Sex: Dec 19, 1945 (70 y.o. Male) Treating RN: Phillis Haggis Primary Care Physician: Oretha Milch Other Clinician: Referring Physician: Oretha Milch Treating Physician/Extender: Rudene Re in Treatment: 71 Active Problems Location of Pain Severity and Description of Pain Patient Has Paino No Site Locations With Dressing Change: No Pain Management and Medication Current Pain Management: Electronic Signature(s) Signed: 08/29/2016 5:28:24 PM By: Alejandro Mulling Entered By: Alejandro Mulling on 08/29/2016  15:22:16 [name] ([id number]) -------------------------------------------------------------------------------- Patient/Caregiver Education Details Patient Name: [name] Date of Service: 08/29/2016 3:15 PM Medical Record Number: [id number] Patient Account Number: [id number] Date of Birth/Gender: [birth year] (70 y.o. Male) Treating RN: Phillis Haggis Primary Care Physician: Oretha Milch Other Clinician: Referring Physician: Oretha Milch Treating Physician/Extender: Rudene Re in Treatment: 39 Education Assessment Education Provided To: Patient Education Topics Provided Wound/Skin Impairment: Handouts: Other: change dressing as ordered and do not get dressing wet Methods: Demonstration, Explain/Verbal Responses: State content correctly Electronic Signature(s) Signed: 08/29/2016 5:28:24 PM By: Alejandro Mulling Entered By: Alejandro Mulling on 08/29/2016 16:21:08 [name] ([id number]) -------------------------------------------------------------------------------- Wound Assessment Details Patient Name: [name] Date of Service: 08/29/2016 3:15 PM Medical Record Number: [id number] Patient Account Number: [id number] Date of Birth/Sex: 11-22-1945 (70 y.o. Male) Treating RN: Phillis Haggis Primary Care Physician: Oretha Milch Other Clinician: Referring Physician: Oretha Milch Treating Physician/Extender: Rudene Re in Treatment: 34 Wound Status Wound Number: 10 Primary Lymphedema Etiology: Wound Location: Right Lower Leg - Anterior Secondary Diabetic Wound/Ulcer of the Lower Wounding Event: Blister Etiology: Extremity Date Acquired: 08/22/2016 Wound Open Weeks Of Treatment: 1 Status: Clustered Wound: No Comorbid Cataracts, Chronic Obstructive History: Pulmonary Disease (COPD), Sleep Apnea, Angina, Congestive Heart Failure, Hypertension, Type II Diabetes, Gout, Osteoarthritis, Neuropathy Photos Photo Uploaded By: Alejandro Mulling on 08/29/2016 17:27:44 Wound Measurements Length: (cm) 0.1 Width: (cm) 0.1 Depth: (cm) 0.1 Area: (cm) 0.008 Volume: (cm) 0.001 % Reduction in Area: 99.3% % Reduction in Volume: 99.2% Epithelialization: None Tunneling: No Undermining: No Wound Description Classification: Partial Thickness Diabetic Severity [address]): Grade 1 Wound Margin: Distinct, outline attach Exudate Amount: Medium Exudate Type: Serous Exudate Color: amber [name], [name] ([id number]) Foul Odor After Cleansing: No ed Wound Bed Granulation Amount: Large (67-100%) Exposed Structure Granulation Quality: Red Fascia Exposed: No Necrotic Amount: None Present (0%)  Fat Layer Exposed: No Tendon Exposed: No Muscle Exposed: No Joint Exposed: No Bone Exposed: No Limited to Skin Breakdown Periwound Skin Texture Texture Color No Abnormalities Noted: No No Abnormalities Noted: No Localized Edema: Yes Erythema: Yes Erythema Location: Circumferential Moisture No Abnormalities Noted: No Temperature / Pain Moist: Yes Temperature: No Abnormality Tenderness on Palpation: Yes Wound Preparation Ulcer Cleansing: Other: soap and water, Topical Anesthetic Applied: None Treatment Notes Wound #10 (Right, Anterior Lower Leg) 1. Cleansed with: Clean wound with Normal Saline Cleanse wound with antibacterial soap and water 2. Anesthetic Topical Lidocaine 4% cream to wound bed prior to debridement 4. Dressing Applied: Aquacel Ag 5. Secondary Dressing Applied ABD Pad 7. Secured with Tape 3 Layer Compression System - Bilateral Notes unna to anchor Electronic Signature(s) Signed: 08/29/2016 5:28:24 PM By: Alejandro Mulling Entered By: Alejandro Mulling on 08/29/2016 15:44:55 [name], [name] ([id number]) -------------------------------------------------------------------------------- Wound Assessment Details Patient Name: [name] Date of Service: 08/29/2016 3:15 PM Medical Record Number:  [id number] Patient Account Number: [id number] Date of Birth/Sex: 09-Aug-1946 (70 y.o. Male) Treating RN: Phillis Haggis Primary Care Physician: Oretha Milch Other Clinician: Referring Physician: Oretha Milch Treating Physician/Extender: Rudene Re in Treatment: 34 Wound Status Wound Number: 3 Primary Pressure Ulcer Etiology: Wound Location: Right Calcaneus Wound Open Wounding Event: Pressure Injury Status: Date Acquired: 10/31/2015 Comorbid Cataracts, Chronic Obstructive Weeks Of Treatment: 34 History: Pulmonary Disease (COPD), Sleep Clustered Wound: No Apnea, Angina, Congestive Heart Failure, Hypertension, Type II Diabetes, Gout, Osteoarthritis, Neuropathy Photos Photo Uploaded By: Alejandro Mulling on 08/29/2016 17:27:45 Wound Measurements Length: (cm) 1 Width: (cm) 2.5 Depth: (cm) 0.1 Area: (cm) 1.963 Volume: (cm) 0.196 % Reduction in Area: 72.2% % Reduction in Volume: 86.1% Epithelialization: Medium (34-66%) Tunneling: No Undermining: No Wound Description Classification: Category/Stage III Foul Odor Diabetic Severity ([place]): Grade 1 Wound Margin: Flat and Intact Exudate Amount: Large Exudate Type: Serous Exudate Color: amber After Cleansing: No Wound Bed Granulation Amount: Medium (34-66%) Exposed Structure [name], [name] ([id number]) Granulation Quality: Red, Pink Fascia Exposed: No Necrotic Amount: Medium (34-66%) Fat Layer Exposed: No Necrotic Quality: Adherent Slough Tendon Exposed: No Muscle Exposed: No Joint Exposed: No Bone Exposed: No Limited to Skin Breakdown Periwound Skin Texture Texture Color No Abnormalities Noted: No No Abnormalities Noted: No Localized Edema: Yes Erythema: Yes Erythema Location: Circumferential Moisture No Abnormalities Noted: No Temperature / Pain Moist: Yes Temperature: No Abnormality Tenderness on Palpation: Yes Wound Preparation Ulcer Cleansing: Other: soap and water, Topical Anesthetic  Applied: Other: lidocaine 4%, Treatment Notes Wound #3 (Right Calcaneus) 1. Cleansed with: Clean wound with Normal Saline 2. Anesthetic Topical Lidocaine 4% cream to wound bed prior to debridement 4. Dressing Applied: Prisma Ag 5. Secondary Dressing Applied Dry Gauze Kerlix/Conform 7. Secured with Tape Notes heel cup Electronic Signature(s) Signed: 08/29/2016 5:28:24 PM By: Alejandro Mulling Entered By: Alejandro Mulling on 08/29/2016 15:43:12 [name], [name] Kitchen ([id number]) -------------------------------------------------------------------------------- Wound Assessment Details Patient Name: [name] Date of Service: 08/29/2016 3:15 PM Medical Record Number: [id number] Patient Account Number: [id number] Date of Birth/Sex: September 08, 1946 (70 y.o. Male) Treating RN: Phillis Haggis Primary Care Physician: Oretha Milch Other Clinician: Referring Physician: Oretha Milch Treating Physician/Extender: Rudene Re in Treatment: 34 Wound Status Wound Number: 9 Primary Diabetic Wound/Ulcer of the Lower Etiology: Extremity Wound Location: Left Lower Leg - Anterior Secondary Venous Leg Ulcer Wounding Event: Gradually Appeared Etiology: Date Acquired: 08/09/2016 Wound Open Weeks Of Treatment: 2 Status: Clustered Wound: Yes Comorbid Cataracts, Chronic Obstructive History: Pulmonary Disease (COPD), Sleep Apnea, Angina, Congestive Heart  Failure, Hypertension, Type II Diabetes, Gout, Osteoarthritis, Neuropathy Photos Photo Uploaded By: Alejandro Mulling on 08/29/2016 17:27:57 Wound Measurements Length: (cm) 12 Width: (cm) 8.5 Depth: (cm) 0.1 Area: (cm) 80.111 Volume: (cm) 8.011 % Reduction in Area: 20% % Reduction in Volume: 20% Tunneling: No Undermining: No Wound Description Classification: Grade 2 Wound Margin: Flat and Intact Exudate Amount: Large Exudate Type: Serosanguineous Exudate Color: red, brown Foul Odor After Cleansing: No Wound Bed [name],  [name] ([id number]) Granulation Amount: Medium (34-66%) Exposed Structure Granulation Quality: Red, Friable Fascia Exposed: No Necrotic Amount: Medium (34-66%) Fat Layer Exposed: No Necrotic Quality: Adherent Slough Tendon Exposed: No Muscle Exposed: No Joint Exposed: No Bone Exposed: No Limited to Skin Breakdown Periwound Skin Texture Texture Color No Abnormalities Noted: No No Abnormalities Noted: No Callus: No Atrophie Blanche: No Crepitus: No Cyanosis: No Excoriation: No Ecchymosis: No Fluctuance: No Erythema: No Friable: No Hemosiderin Staining: Yes Induration: Yes Mottled: No Localized Edema: No Pallor: No Rash: No Rubor: No Scarring: No Moisture No Abnormalities Noted: No Dry / Scaly: No Maceration: No Moist: Yes Wound Preparation Ulcer Cleansing: Rinsed/Irrigated with Saline Topical Anesthetic Applied: None Treatment Notes Wound #9 (Left, Anterior Lower Leg) 1. Cleansed with: Clean wound with Normal Saline Cleanse wound with antibacterial soap and water 2. Anesthetic Topical Lidocaine 4% cream to wound bed prior to debridement 4. Dressing Applied: Aquacel Ag 5. Secondary Dressing Applied ABD Pad 7. Secured with Tape 3 Layer Compression System - Bilateral Notes Cai, Murat V. ([id number]) unna to anchor Electronic Signature(s) Signed: 08/29/2016 5:28:24 PM By: Alejandro Mulling Entered By: Alejandro Mulling on 08/29/2016 15:43:42 Hudman, Zymere V. ([id number]) -------------------------------------------------------------------------------- Vitals Details Patient Name: [name] Date of Service: 08/29/2016 3:15 PM Medical Record Number: [id number] Patient Account Number: [id number] Date of Birth/Sex: 04-Oct-1946 (70 y.o. Male) Treating RN: Phillis Haggis Primary Care Physician: Oretha Milch Other Clinician: Referring Physician: Oretha Milch Treating Physician/Extender: Rudene Re in Treatment: 68 Vital Signs Time Taken:  15:25 Temperature (F): 97.7 Height (in): 70 Pulse (bpm): 65 Weight (lbs): 235 Respiratory Rate (breaths/min): 18 Body Mass Index (BMI): 33.7 Blood Pressure (mmHg): 127/64 Reference Range: 80 - 120 mg / dl Electronic Signature(s) Signed: 08/29/2016 5:28:24 PM By: Alejandro Mulling Entered By: Alejandro Mulling on 08/29/2016 15:26:25

## 2016-09-05 ENCOUNTER — Encounter: Payer: Medicare Other | Admitting: Surgery

## 2016-09-05 DIAGNOSIS — E11621 Type 2 diabetes mellitus with foot ulcer: Secondary | ICD-10-CM | POA: Diagnosis not present

## 2016-09-06 NOTE — Progress Notes (Signed)
ZAYVIEN, CANNING ([id number]) Visit Report for 09/05/2016 Chief Complaint Document Details Patient Name: [name], [name] Date of Service: 09/05/2016 2:15 PM Medical Record Number: [id number] Patient Account Number: [id number] Date of Birth/Sex: 1945-11-26 (70 y.o. Male) Treating RN: Phillis Haggis Primary Care Physician: Oretha Milch Other Clinician: Referring Physician: Oretha Milch Treating Physician/Extender: Rudene Re in Treatment: 100 Information Obtained from: Patient Chief Complaint Patient is at the clinic for treatment of an open pressure ulcer the left upper thigh and gluteal region and the right heel with bilateral swelling of the legs all of it which is going on for over a year Electronic Signature(s) Signed: 09/05/2016 2:52:06 PM By: Evlyn Kanner MD, FACS Entered By: Evlyn Kanner on 09/05/2016 14:52:06 [name] ([id number]) -------------------------------------------------------------------------------- HPI Details Patient Name: [name] Date of Service: 09/05/2016 2:15 PM Medical Record Number: [id number] Patient Account Number: [id number] Date of Birth/Sex: January 05, 1946 (70 y.o. Male) Treating RN: Phillis Haggis Primary Care Physician: Oretha Milch Other Clinician: Referring Physician: Oretha Milch Treating Physician/Extender: Rudene Re in Treatment: 35 History of Present Illness Location: ulcerated area on the right heel, left gluteal region and thigh and then bilateral lower extremities Quality: Patient reports experiencing a sharp pain to affected area(s). Severity: Patient states wound are getting worse. Duration: Patient has had the wound for > 12 months prior to seeking treatment at the wound center Timing: Pain in wound is constant (hurts all the time) Context: The wound appeared gradually over time Modifying Factors: Other treatment(s) tried include: he sees his heart doctor and his primary care doctor Associated Signs  and Symptoms: patient has not been able to walk for over a year now HPI Description: 70 year old gentleman with a known history of hypertension, diabetes, obstructive sleep apnea, COPD, diastolic CHF, coronary artery disease was admitted to the hospital with sepsis from an ulcer of the right heel and was treated there in October 2016. he also has chronic bilateral lower extremity edema and lymphedema. He had received vancomycin, Zosyn and at that stage and x-rays showed hardware in the right ankle but no evidence of osteomyelitis. he was a former smoker. he was also treated with Augmentin orally for 10 days. He is either bedbound or wheelchair-bound and does not ambulate by himself. 01/19/2016 -- he has not been seen here for 3 weeks and this was because he was admitted to [facility] between 223 and 01/08/2016 for sepsis, UTI and pneumonia involving the left lung. he was treated with IV vancomycin and Zosyn and then meropenem. Was discharged home on oral Bactrim for 2 weeks none of his vascular test or x-rays were done and we will reorder these. 01/26/2016 -- he has not yet done the x-ray of his foot and is vascular tests are still pending. I have asked him to work on these with his nursing home staff. Addendum: he has got an x-ray of the right foot done which shows that his osteopenia but no specific ostial lysis or abnormal periosteal reaction. Final impression was degenerative changes with dorsal foot soft tissue swelling. 02/16/2016 -- lower extremity arterial duplex examination shows a 50-99% stenosis of the right tibioperoneal trunk. He had biphasic flow in the right SFA, popliteal and tibioperoneal trunk. Left-sided he had triphasic flow throughout 02/29/2016 -- he is awaiting his vascular opinion with Dr. Gilda Crease which is to be done on April 24 03/08/2016 -- he has not kept his appointment with Dr. Gilda Crease on April 24 and does not seem to know what happened  about  this. it's difficult to gauge whether he is in full control of his mental faculties as at times he is extremely rude to the nursing staff. 03/22/2016 -- the patient was seen by Dr. 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 Recommended that the patient had severe atherosclerotic changes of both lower extremities associated with ulceration and tissue loss of the foot. This is a limb threatening ischemia and the patient was recommended to undergo Schoonmaker, Tobechukwu V. ([id number]) angiography of the lower extremity with a hope for intervention for limb salvage. Patient agreed and will proceed to angiography. He was admitted to the hospital between May 2 and 03/15/2016 - he underwent induction of a catheter into his right lower extremity and third order catheter placement with contrast injection to the right lower extremity for distal runoff. Percutaneous transluminal angioplasty of the right superficial femoral and popliteal arteries were done. He also had a right peroneal angioplasty. Patient had a postoperative hematoma and was admitted for observation and in the next 24 hours he was very agitated and combative and had to be seen by psychiatric. He had a follow-up with Dr. Gilda CreaseSchnier in 3 weeks. 04/18/2016 -- notes reviewed from the vascular office where he was seen for his postop visit after the PTA of the right SFA and popliteal arteries on 03/12/2016. He underwent an ABI which showed right ABI to be more than 1.3 and left to be more than 1 more than 1.3, great toe and PPG waveforms are decreased bilaterally. No additional intervention indicated at this time and the patient was to follow-up in 3 months with an ABI and bilateral lower extremity duplex study. 05/02/2016 -- he complains that his compression stockings are causing him a lot of discomfort and he is not happy wearing  them. 05/30/2016 -- the swelling of both legs has increased again and he has had blisters which have opened out into ulcerations. Electronic Signature(s) Signed: 09/05/2016 2:52:18 PM By: Evlyn KannerBritto, Ronica Vivian MD, FACS Entered By: Evlyn KannerBritto, Bryah [place]cheltree on 09/05/2016 14:52:18 [name] OttoLINDLEY, [name] ([id number]) -------------------------------------------------------------------------------- Physical Exam Details Patient Name: [name] OttoLINDLEY, [name] Date of Service: 09/05/2016 2:15 PM Medical Record Number: [id number] Patient Account Number: [id number] Date of Birth/Sex: 02-Dec-1945 31(70 y.o. Male) Treating RN: Phillis HaggisPinkerton, Debi Primary Care Physician: Oretha MilchSMITH, [name] Other Clinician: Referring Physician: Oretha MilchSMITH, [name] Treating Physician/Extender: Rudene ReBritto, Jailah Willis Weeks in Treatment: 35 Constitutional . Pulse regular. Respirations normal and unlabored. Afebrile. . Eyes Nonicteric. Reactive to light. Ears, Nose, Mouth, and Throat Lips, teeth, and gums WNL.Marland Kitchen. Moist mucosa without lesions. Neck supple and nontender. No palpable supraclavicular or cervical adenopathy. Normal sized without goiter. Respiratory WNL. No retractions.. Breath sounds WNL, No rubs, rales, rhonchi, or wheeze.. Cardiovascular Heart rhythm and rate regular, no murmur or gallop.. Pedal Pulses WNL. No clubbing, cyanosis or edema. Lymphatic No adneopathy. No adenopathy. No adenopathy. Musculoskeletal Adexa without tenderness or enlargement.. Digits and nails w/o clubbing, cyanosis, infection, petechiae, ischemia, or inflammatory conditions.. Integumentary (Hair, Skin) No suspicious lesions. No crepitus or fluctuance. No peri-wound warmth or erythema. No masses.Marland Kitchen. Psychiatric Judgement and insight Intact.. No evidence of depression, anxiety, or agitation.. Notes the right heel is looking very clean today and I have forced out the area with moist saline gauze and removed all the debris. Both lower extremities have edema which is feeling better controlled  and there are only superficial ulcerations. Electronic Signature(s) Signed: 09/05/2016 2:53:06 PM By: Evlyn KannerBritto, Nadia Torr MD,  FACS Entered By: Evlyn Kanner on 09/05/2016 14:53:05 [name] ([id number]) -------------------------------------------------------------------------------- Physician Orders Details Patient Name: [name] Date of Service: 09/05/2016 2:15 PM Medical Record Number: [id number] Patient Account Number: [id number] Date of Birth/Sex: 1946-11-11 (70 y.o. Male) Treating RN: [name], RN, [name], Moody Sink Primary Care Physician: Oretha Milch Other Clinician: Referring Physician: Oretha Milch Treating Physician/Extender: Rudene Re in Treatment: 67 Verbal / Phone Orders: Yes Clinician: [name], RN, [name], Rita Read Back and Verified: Yes Diagnosis Coding Wound Cleansing Wound #10 Right,Anterior Lower Leg o No tub bath. - only sink bath or bed bath Wound #3 Right Calcaneus o No tub bath. - only sink bath or bed bath Wound #9 Left,Anterior Lower Leg o No tub bath. - only sink bath or bed bath Anesthetic Wound #10 Right,Anterior Lower Leg o Topical Lidocaine 4% cream applied to wound bed prior to debridement - for clinic use only Wound #3 Right Calcaneus o Topical Lidocaine 4% cream applied to wound bed prior to debridement - for clinic use only Wound #9 Left,Anterior Lower Leg o Topical Lidocaine 4% cream applied to wound bed prior to debridement - for clinic use only Skin Barriers/Peri-Wound Care Wound #10 Right,Anterior Lower Leg o Barrier cream - around the heel wound to reddened and irritated areas Wound #3 Right Calcaneus o Barrier cream - around the heel wound to reddened and irritated areas Wound #9 Left,Anterior Lower Leg o Barrier cream - around the heel wound to reddened and irritated areas Primary Wound Dressing Wound #10 Right,Anterior Lower Leg o Sorbalgon Ag Wound #3 Right Calcaneus o Other: - Sorbact with  hydrogel [name], [name] ([id number]) Wound #9 Left,Anterior Lower Leg o Sorbalgon Ag Secondary Dressing Wound #3 Right Calcaneus o Dry Gauze - Heel cups Dressing Change Frequency Wound #10 Right,Anterior Lower Leg o Change dressing every week Wound #3 Right Calcaneus o Change dressing every other day. - unless saturated or soiled Wound #9 Left,Anterior Lower Leg o Change dressing every week Follow-up Appointments Wound #3 Right Calcaneus o Return Appointment in 1 week. Edema Control o 3 Layer Compression System - Bilateral o Other: - unna to anchor Off-Loading Wound #3 Right Calcaneus o Turn and reposition every 2 hours o Other: - sage boots at night and float heel when lying in bed Additional Orders / Instructions Wound #3 Right Calcaneus o Increase protein intake. Medications-please add to medication list. Wound #3 Right Calcaneus o Other: - Vitamin C, Vitamin A, Zinc, multivitamin Electronic Signature(s) Signed: 09/05/2016 4:19:07 PM By: Evlyn Kanner MD, FACS Signed: 09/05/2016 5:07:41 PM By: Elpidio Eric [name], RN Entered By: Elpidio Eric on 09/05/2016 14:47:17 [name], [name] ([id number]) [name], Milik Seth [name] ([id number]) -------------------------------------------------------------------------------- Problem List Details Patient Name: [name] Date of Service: 09/05/2016 2:15 PM Medical Record Number: [id number] Patient Account Number: [id number] Date of Birth/Sex: November 28, 1945 (70 y.o. Male) Treating RN: Phillis Haggis Primary Care Physician: Oretha Milch Other Clinician: Referring Physician: Oretha Milch Treating Physician/Extender: Rudene Re in Treatment: 67 Active Problems ICD-10 Encounter Code Description Active Date Diagnosis E11.621 Type 2 diabetes mellitus with foot ulcer 01/01/2016 Yes L89.613 Pressure ulcer of right heel, stage 3 01/01/2016 Yes E66.01 Morbid (severe) obesity due to excess calories 01/01/2016  Yes I89.0 Lymphedema, not elsewhere classified 01/01/2016 Yes M70.871 Other soft tissue disorders related to use, overuse and 01/19/2016 Yes pressure, right ankle and foot 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/16/2016 Yes ulceration of heel and midfoot Inactive Problems Resolved Problems ICD-10 Code Description Active Date Resolved  Date L89.322 Pressure ulcer of left buttock, stage 2 01/01/2016 01/01/2016 Electronic Signature(s) Signed: 09/05/2016 2:51:58 PM By: Evlyn Kanner MD, FACS Entered By: Evlyn Kanner on 09/05/2016 14:51:57 [name] ([id number]) [name], [name] ([id number]) -------------------------------------------------------------------------------- Progress Note Details Patient Name: [name] Date of Service: 09/05/2016 2:15 PM Medical Record Number: [id number] Patient Account Number: [id number] Date of Birth/Sex: 1946/07/10 (70 y.o. Male) Treating RN: Phillis Haggis Primary Care Physician: Oretha Milch Other Clinician: Referring Physician: Oretha Milch Treating Physician/Extender: Rudene Re in Treatment: 53 Subjective Chief Complaint Information obtained from Patient Patient is at the clinic for treatment of an open pressure ulcer the left upper thigh and gluteal region and the right heel with bilateral swelling of the legs all of it which is going on for over a year History of Present Illness (HPI) The following HPI elements were documented for the patient's wound: Location: ulcerated area on the right heel, left gluteal region and thigh and then bilateral lower extremities Quality: Patient reports experiencing a sharp pain to affected area(s). Severity: Patient states wound are getting worse. Duration: Patient has had the wound for > 12 months prior to seeking treatment at the wound center Timing: Pain in wound is constant (hurts all the time) Context: The wound appeared gradually over time Modifying Factors: Other  treatment(s) tried include: he sees his heart doctor and his primary care doctor Associated Signs and Symptoms: patient has not been able to walk for over a year now 70 year old gentleman with a known history of hypertension, diabetes, obstructive sleep apnea, COPD, diastolic CHF, coronary artery disease was admitted to the hospital with sepsis from an ulcer of the right heel and was treated there in October 2016. he also has chronic bilateral lower extremity edema and lymphedema. He had received vancomycin, Zosyn and at that stage and x-rays showed hardware in the right ankle but no evidence of osteomyelitis. he was a former smoker. he was also treated with Augmentin orally for 10 days. He is either bedbound or wheelchair-bound and does not ambulate by himself. 01/19/2016 -- he has not been seen here for 3 weeks and this was because he was admitted to [facility] between 223 and 01/08/2016 for sepsis, UTI and pneumonia involving the left lung. he was treated with IV vancomycin and Zosyn and then meropenem. Was discharged home on oral Bactrim for 2 weeks none of his vascular test or x-rays were done and we will reorder these. 01/26/2016 -- he has not yet done the x-ray of his foot and is vascular tests are still pending. I have asked him to work on these with his nursing home staff. Addendum: he has got an x-ray of the right foot done which shows that his osteopenia but no specific ostial lysis or abnormal periosteal reaction. Final impression was degenerative changes with dorsal foot soft tissue swelling. 02/16/2016 -- lower extremity arterial duplex examination shows a 50-99% stenosis of the right tibioperoneal trunk. He had biphasic flow in the right SFA, popliteal and tibioperoneal trunk. Left-sided he had triphasic flow throughout [name], [name] ([id number]) 02/29/2016 -- he is awaiting his vascular opinion with Dr. Gilda Crease which is to be done on April  24 03/08/2016 -- he has not kept his appointment with Dr. Gilda Crease on April 24 and does not seem to know what happened about this. it's difficult to gauge whether he is in full control of his mental faculties as at times he is extremely rude to the nursing staff. 03/22/2016 -- the  patient was seen by Dr. 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 Recommended that the patient had severe atherosclerotic changes of both lower extremities associated with ulceration and tissue loss of the foot. This is a limb threatening ischemia and the patient was recommended to undergo angiography of the lower extremity with a hope for intervention for limb salvage. Patient agreed and will proceed to angiography. He was admitted to the hospital between May 2 and 03/15/2016 - he underwent induction of a catheter into his right lower extremity and third order catheter placement with contrast injection to the right lower extremity for distal runoff. Percutaneous transluminal angioplasty of the right superficial femoral and popliteal arteries were done. He also had a right peroneal angioplasty. Patient had a postoperative hematoma and was admitted for observation and in the next 24 hours he was very agitated and combative and had to be seen by psychiatric. He had a follow-up with Dr. Gilda Crease in 3 weeks. 04/18/2016 -- notes reviewed from the vascular office where he was seen for his postop visit after the PTA of the right SFA and popliteal arteries on 03/12/2016. He underwent an ABI which showed right ABI to be more than 1.3 and left to be more than 1 more than 1.3, great toe and PPG waveforms are decreased bilaterally. No additional intervention indicated at this time and the patient was to follow-up in 3 months with an ABI and bilateral lower extremity duplex study. 05/02/2016 -- he complains that his  compression stockings are causing him a lot of discomfort and he is not happy wearing them. 05/30/2016 -- the swelling of both legs has increased again and he has had blisters which have opened out into ulcerations. Objective Constitutional Pulse regular. Respirations normal and unlabored. Afebrile. Vitals Time Taken: 2:14 PM, Height: 70 in, Weight: 235 lbs, BMI: 33.7, Temperature: 98.1 F, Pulse: 62 bpm, Respiratory Rate: 18 breaths/min, Blood Pressure: 124/60 mmHg. Eyes Nonicteric. Reactive to light. Ears, Nose, Mouth, and Throat Lips, teeth, and gums WNL.Marland Kitchen Moist mucosa without lesions. Neck [name], [name] ([id number]) supple and nontender. No palpable supraclavicular or cervical adenopathy. Normal sized without goiter. Respiratory WNL. No retractions.. Breath sounds WNL, No rubs, rales, rhonchi, or wheeze.. Cardiovascular Heart rhythm and rate regular, no murmur or gallop.. Pedal Pulses WNL. No clubbing, cyanosis or edema. Lymphatic No adneopathy. No adenopathy. No adenopathy. Musculoskeletal Adexa without tenderness or enlargement.. Digits and nails w/o clubbing, cyanosis, infection, petechiae, ischemia, or inflammatory conditions.Marland Kitchen Psychiatric Judgement and insight Intact.. No evidence of depression, anxiety, or agitation.. General Notes: the right heel is looking very clean today and I have forced out the area with moist saline gauze and removed all the debris. Both lower extremities have edema which is feeling better controlled and there are only superficial ulcerations. Integumentary (Hair, Skin) No suspicious lesions. No crepitus or fluctuance. No peri-wound warmth or erythema. No masses.. Wound #10 status is Open. Original cause of wound was Blister. The wound is located on the Right,Anterior Lower Leg. The wound measures 0.1cm length x 0.1cm width x 0.1cm depth; 0.008cm^2 area and 0.001cm^3 volume. The wound is limited to skin breakdown. There is no tunneling or  undermining noted. There is a large amount of serous drainage noted. The wound margin is distinct with the outline attached to the wound base. There is large (67-100%) red granulation within the wound bed. There is no necrotic tissue within the  wound bed. The periwound skin appearance exhibited: Localized Edema, Moist, Erythema. The surrounding wound skin color is noted with erythema which is circumferential. Periwound temperature was noted as No Abnormality. The periwound has tenderness on palpation. Wound #3 status is Open. Original cause of wound was Pressure Injury. The wound is located on the Right Calcaneus. The wound measures 1cm length x 2.5cm width x 0.1cm depth; 1.963cm^2 area and 0.196cm^3 volume. The wound is limited to skin breakdown. There is no tunneling or undermining noted. There is a large amount of serous drainage noted. The wound margin is flat and intact. There is medium (34-66%) red, pink granulation within the wound bed. There is a medium (34-66%) amount of necrotic tissue within the wound bed including Adherent Slough. The periwound skin appearance exhibited: Localized Edema, Moist, Erythema. The surrounding wound skin color is noted with erythema which is circumferential. Periwound temperature was noted as No Abnormality. The periwound has tenderness on palpation. Wound #9 status is Open. Original cause of wound was Gradually Appeared. The wound is located on the Left,Anterior Lower Leg. The wound measures 9.5cm length x 10cm width x 0.1cm depth; 74.613cm^2 area and 7.461cm^3 volume. The wound is limited to skin breakdown. There is no tunneling or undermining noted. There is a large amount of serous drainage noted. The wound margin is flat and intact. There is large (67-100%) red, friable granulation within the wound bed. There is a small (1-33%) amount of necrotic tissue within the wound bed including Adherent Slough. The periwound skin appearance exhibited:  Induration, [name], [name] ([id number]) Moist, Hemosiderin Staining. The periwound skin appearance did not exhibit: Callus, Crepitus, Excoriation, Fluctuance, Friable, Localized Edema, Rash, Scarring, Dry/Scaly, Maceration, Atrophie Blanche, Cyanosis, Ecchymosis, Mottled, Pallor, Rubor, Erythema. Assessment Active Problems ICD-10 E11.621 - Type 2 diabetes mellitus with foot ulcer L89.613 - Pressure ulcer of right heel, stage 3 E66.01 - Morbid (severe) obesity due to excess calories I89.0 - Lymphedema, not elsewhere classified M70.871 - Other soft tissue disorders related to use, overuse and pressure, right ankle and foot I70.234 - Atherosclerosis of native arteries of right leg with ulceration of heel and midfoot Plan Wound Cleansing: Wound #10 Right,Anterior Lower Leg: No tub bath. - only sink bath or bed bath Wound #3 Right Calcaneus: No tub bath. - only sink bath or bed bath Wound #9 Left,Anterior Lower Leg: No tub bath. - only sink bath or bed bath Anesthetic: Wound #10 Right,Anterior Lower Leg: Topical Lidocaine 4% cream applied to wound bed prior to debridement - for clinic use only Wound #3 Right Calcaneus: Topical Lidocaine 4% cream applied to wound bed prior to debridement - for clinic use only Wound #9 Left,Anterior Lower Leg: Topical Lidocaine 4% cream applied to wound bed prior to debridement - for clinic use only Skin Barriers/Peri-Wound Care: Wound #10 Right,Anterior Lower Leg: Barrier cream - around the heel wound to reddened and irritated areas Wound #3 Right Calcaneus: Barrier cream - around the heel wound to reddened and irritated areas Wound #9 Left,Anterior Lower Leg: Barrier cream - around the heel wound to reddened and irritated areas Primary Wound Dressing: Wound #10 Right,Anterior Lower Leg: [name], [name] ([id number]) Wound #3 Right Calcaneus: Other: - Sorbact with hydrogel Wound #9 Left,Anterior Lower Leg: Sorbalgon Ag Secondary  Dressing: Wound #3 Right Calcaneus: Dry Gauze - Heel cups Dressing Change Frequency: Wound #10 Right,Anterior Lower Leg: Change dressing every week Wound #3 Right Calcaneus: Change dressing every other day. - unless saturated or soiled Wound #9 Left,Anterior Lower  Leg: Change dressing every week Follow-up Appointments: Wound #3 Right Calcaneus: Return Appointment in 1 week. Edema Control: 3 Layer Compression System - Bilateral Other: - unna to anchor Off-Loading: Wound #3 Right Calcaneus: Turn and reposition every 2 hours Other: - sage boots at night and float heel when lying in bed Additional Orders / Instructions: Wound #3 Right Calcaneus: Increase protein intake. Medications-please add to medication list.: Wound #3 Right Calcaneus: Other: - Vitamin C, Vitamin A, Zinc, multivitamin I have recommended Sorbact with hydrogel for his right heel and we will continue with silver alginate and his compression wraps for the lower extremities. The patient has had extensive conservative treatment and at this stage I would like to use a cellular or tissue base product to hasten the process of healing his wound. we'll use his lymphedema pumps as soon as they become available to him. Electronic Signature(s) Signed: 09/05/2016 2:55:48 PM By: Evlyn Kanner MD, FACS Entered By: Evlyn Kanner on 09/05/2016 14:55:47 [name] ([id number]) [name], Luis Seth [name] ([id number]) -------------------------------------------------------------------------------- SuperBill Details Patient Name: [name] Date of Service: 09/05/2016 Medical Record Number: [id number] Patient Account Number: [id number] Date of Birth/Sex: [birth year] (70 y.o. 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 ICD-10 Description Diagnosis I89.0 Lymphedema, not elsewhere classified Physician Procedures CPT4 Code: [id number] Description: 99213 - WC PHYS LEVEL 3 - EST PT ICD-10 Description Diagnosis E11.621 Type 2 diabetes mellitus with foot ulcer L89.613 Pressure ulcer of right heel, stage 3 E66.01 Morbid (severe) obesity due to excess calories I89.0 Lymphedema, not  elsewhere classified Modifier: Quantity: 1 Electronic Signature(s) Signed: 09/05/2016 3:03:07 PM By: Elpidio Eric [name], RN Signed: 09/05/2016 4:19:07 PM By: Evlyn Kanner MD, FACS [name] ([id number]) Previous Signature: 09/05/2016 2:56:09 PM Version By: Evlyn Kanner MD, FACS Entered By: Elpidio Eric on 09/05/2016 15:03:05

## 2016-09-07 NOTE — Progress Notes (Signed)
[name] ([id number]) Visit Report for 09/05/2016 Arrival Information Details Patient Name: [name], [name] Date of Service: 09/05/2016 2:15 PM Medical Record Number: [id number] Patient Account Number: [id number] Date of Birth/Sex: [birth year] (70 y.o. Male) Treating RN: Phillis Haggis Primary Care Physician: Oretha Milch Other Clinician: Referring Physician: Oretha Milch Treating Physician/Extender: Rudene Re in Treatment: 35 Visit Information History Since Last Visit All ordered tests and consults were completed: No Patient Arrived: Wheel Chair Added or deleted any medications: No Arrival Time: 14:12 Any new allergies or adverse reactions: No Accompanied By: self Had a fall or experienced change in No Transfer Assistance: EasyPivot activities of daily living that may affect Patient Lift risk of falls: Patient Requires Transmission- No Signs or symptoms of abuse/neglect since last No Based Precautions: visito Patient Has Alerts: Yes Hospitalized since last visit: No Patient Alerts: DM II Pain Present Now: No Electronic Signature(s) Signed: 09/06/2016 5:06:12 PM By: Alejandro Mulling Entered By: Alejandro Mulling on 09/05/2016 14:14:24 [name] ([id number]) -------------------------------------------------------------------------------- Clinic Level of Care Assessment Details Patient Name: [name] Date of Service: 09/05/2016 2:15 PM Medical Record Number: [id number] Patient Account Number: [id number] Date of Birth/Sex: Dec 12, 1945 (70 y.o. Male) Treating RN: [name], RN, [name], Rita Primary Care Physician: Oretha Milch Other Clinician: Referring Physician: Oretha Milch Treating Physician/Extender: Rudene Re in Treatment: 35 Clinic Level of Care Assessment Items TOOL 4 Quantity Score []  - Use when only an EandM is performed on FOLLOW-UP visit 0 ASSESSMENTS - Nursing Assessment / Reassessment X - Reassessment of Co-morbidities  (includes updates in patient status) 1 10 X - Reassessment of Adherence to Treatment Plan 1 5 ASSESSMENTS - Wound and Skin Assessment / Reassessment []  - Simple Wound Assessment / Reassessment - one wound 0 X - Complex Wound Assessment / Reassessment - multiple wounds 3 5 []  - Dermatologic / Skin Assessment (not related to wound area) 0 ASSESSMENTS - Focused Assessment []  - Circumferential Edema Measurements - multi extremities 0 []  - Nutritional Assessment / Counseling / Intervention 0 X - Lower Extremity Assessment (monofilament, tuning fork, pulses) 1 5 []  - Peripheral Arterial Disease Assessment (using hand held doppler) 0 ASSESSMENTS - Ostomy and/or Continence Assessment and Care []  - Incontinence Assessment and Management 0 []  - Ostomy Care Assessment and Management (repouching, etc.) 0 PROCESS - Coordination of Care X - Simple Patient / Family Education for ongoing care 1 15 []  - Complex (extensive) Patient / Family Education for ongoing care 0 X - Staff obtains Chiropractor, Records, Test Results / Process Orders 1 10 []  - Staff telephones HHA, Nursing Homes / Clarify orders / etc 0 []  - Routine Transfer to another Facility (non-emergent condition) 0 Mellor, [name] ([id number]) []  - Routine Hospital Admission (non-emergent condition) 0 []  - New Admissions / Manufacturing engineer / Ordering NPWT, Apligraf, etc. 0 []  - Emergency Hospital Admission (emergent condition) 0 []  - Simple Discharge Coordination 0 []  - Complex (extensive) Discharge Coordination 0 PROCESS - Special Needs []  - Pediatric / Minor Patient Management 0 []  - Isolation Patient Management 0 []  - Hearing / Language / Visual special needs 0 []  - Assessment of Community assistance (transportation, D/C planning, etc.) 0 []  - Additional assistance / Altered mentation 0 []  - Support Surface(s) Assessment (bed, cushion, seat, etc.) 0 INTERVENTIONS - Wound Cleansing / Measurement []  - Simple Wound Cleansing - one  wound 0 X - Complex Wound Cleansing - multiple wounds 3 5 X - Wound Imaging (photographs - any number of wounds) 1 5 []  -  Wound Tracing (instead of photographs) 0 []  - Simple Wound Measurement - one wound 0 X - Complex Wound Measurement - multiple wounds 3 5 INTERVENTIONS - Wound Dressings X - Small Wound Dressing one or multiple wounds 1 10 []  - Medium Wound Dressing one or multiple wounds 0 []  - Large Wound Dressing one or multiple wounds 0 []  - Application of Medications - topical 0 []  - Application of Medications - injection 0 INTERVENTIONS - Miscellaneous []  - External ear exam 0 [name], [name] ([id number]) []  - Specimen Collection (cultures, biopsies, blood, body fluids, etc.) 0 []  - Specimen(s) / Culture(s) sent or taken to Lab for analysis 0 []  - Patient Transfer (multiple staff / Michiel SitesHoyer Lift / Similar devices) 0 []  - Simple Staple / Suture removal (25 or less) 0 []  - Complex Staple / Suture removal (26 or more) 0 []  - Hypo / Hyperglycemic Management (close monitor of Blood Glucose) 0 []  - Ankle / Brachial Index (ABI) - do not check if billed separately 0 X - Vital Signs 1 5 Has the patient been seen at the hospital within the last three years: Yes Total Score: 110 Level Of Care: New/Established - Level 3 Electronic Signature(s) Signed: 09/05/2016 5:07:41 PM By: Elpidio EricAfful, Rita [name], RN Entered By: Elpidio EricAfful, Rita on 09/05/2016 14:48:12 [name] OttoLINDLEY, [name] ([id number]) -------------------------------------------------------------------------------- Encounter Discharge Information Details Patient Name: [name] OttoLINDLEY, [name] Date of Service: 09/05/2016 2:15 PM Medical Record Number: [id number] Patient Account Number: [id number] Date of Birth/Sex: [birth year] 27(70 y.o. Male) Treating RN: Phillis HaggisPinkerton, Debi Primary Care Physician: Oretha MilchSMITH, [name] Other Clinician: Referring Physician: Oretha MilchSMITH, [name] Treating Physician/Extender: Rudene ReBritto, Errol Weeks in Treatment: 2335 Encounter Discharge Information  Items Discharge Pain Level: 0 Discharge Condition: Stable Ambulatory Status: Wheelchair Discharge Destination: Nursing Home Transportation: Other Accompanied By: self Schedule Follow-up Appointment: Yes Medication Reconciliation completed and provided to Patient/Care Yes Danen Lapaglia: Provided on Clinical Summary of Care: 09/05/2016 Form Type Recipient Paper Patient EL Electronic Signature(s) Signed: 09/05/2016 3:16:50 PM By: Gwenlyn PerkingMoore, Shelia Entered By: Gwenlyn PerkingMoore, Shelia on 09/05/2016 15:16:50 Clint GuyLINDLEY, Jamion Seth BakeV. ([id number]) -------------------------------------------------------------------------------- Lower Extremity Assessment Details Patient Name: [name] OttoLINDLEY, [name] Date of Service: 09/05/2016 2:15 PM Medical Record Number: [id number] Patient Account Number: [id number] Date of Birth/Sex: [birth year] 38(70 y.o. Male) Treating RN: Phillis HaggisPinkerton, Debi Primary Care Physician: Oretha MilchSMITH, [name] Other Clinician: Referring Physician: Oretha MilchSMITH, [name] Treating Physician/Extender: Rudene ReBritto, Errol Weeks in Treatment: 35 Edema Assessment Assessed: [Left: No] [Right: No] E[Left: dema] [Right: :] Calf Left: Right: Point of Measurement: 34 cm From Medial Instep 38.4 cm 34.4 cm Ankle Left: Right: Point of Measurement: 12 cm From Medial Instep 26.2 cm 25 cm Vascular Assessment Pulses: Posterior Tibial Dorsalis Pedis Palpable: [Left:Yes] [Right:Yes] Extremity colors, hair growth, and conditions: Extremity Color: [Left:Hyperpigmented] [Right:Hyperpigmented] Temperature of Extremity: [Left:Warm] [Right:Warm] Capillary Refill: [Left:< 3 seconds] [Right:< 3 seconds] Toe Nail Assessment Left: Right: Thick: Yes Yes Discolored: Yes Yes Deformed: Yes Yes Improper Length and Hygiene: Yes Yes Electronic Signature(s) Signed: 09/06/2016 5:06:12 PM By: Alejandro MullingPinkerton, Debra Entered By: Alejandro MullingPinkerton, Debra on 09/05/2016 14:25:44 [name], [name] Kitchen.  ([id number]) -------------------------------------------------------------------------------- Multi Wound Chart Details Patient Name: [name] OttoLINDLEY, Jylan V. Date of Service: 09/05/2016 2:15 PM Medical Record Number: [id number] Patient Account Number: [id number] Date of Birth/Sex: [birth year] 40(70 y.o. Male) Treating RN: [name], RN, [name], [name] Primary Care Physician: Oretha MilchSMITH, [name] Other Clinician: Referring Physician: Oretha MilchSMITH, [name] Treating Physician/Extender: Rudene ReBritto, Errol Weeks in Treatment: 35 Vital Signs Height(in): 70 Pulse(bpm): 62 Weight(lbs): 235 Blood Pressure 124/60 (mmHg): Body Mass Index(BMI): 34 Temperature(F): 98.1 Respiratory Rate 18 (breaths/min): Photos: Wound  Location: Right Lower Leg - Anterior Right Calcaneus Left Lower Leg - Anterior Wounding Event: Blister Pressure Injury Gradually Appeared Primary Etiology: Lymphedema Pressure Ulcer 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 Comorbid History: Cataracts, Chronic Cataracts, Chronic Cataracts, Chronic Obstructive Pulmonary Obstructive Pulmonary Obstructive Pulmonary Disease (COPD), Sleep Disease (COPD), Sleep Disease (COPD), Sleep Apnea, Angina, Apnea, Angina, Apnea, Angina, Congestive Heart Failure, Congestive Heart Failure, Congestive Heart Failure, Hypertension, Type II Hypertension, Type II Hypertension, Type II Diabetes, Gout, Diabetes, Gout, Diabetes, Gout, Osteoarthritis, Neuropathy Osteoarthritis, Neuropathy Osteoarthritis, Neuropathy Date Acquired: 08/22/2016 10/31/2015 08/09/2016 Weeks of Treatment: 2 35 3 Wound Status: Open Open Open Clustered Wound: No No Yes Measurements L x W x D 0.1x0.1x0.1 1x2.5x0.1 9.5x10x0.1 (cm) Area (cm) : 0.008 1.963 74.613 Volume (cm) : 0.001 0.196 7.461 % Reduction in Area: 99.30% 72.20% 25.50% Tacker, Ziquan V. ([id number]) % Reduction in Volume: 99.20% 86.10% 25.50% Classification:  Partial Thickness Category/Stage III Grade 2 HBO Classification: Grade 1 Grade 1 N/A Exudate Amount: Large Large Large Exudate Type: Serous Serous Serous Exudate Color: amber amber amber Wound Margin: Distinct, outline attached Flat and Intact Flat and Intact Granulation Amount: Large (67-100%) Medium (34-66%) Large (67-100%) Granulation Quality: Red Red, Pink Red, Friable Necrotic Amount: None Present (0%) Medium (34-66%) Small (1-33%) Exposed Structures: Fascia: No Fascia: No Fascia: No Fat: No Fat: No Fat: No Tendon: No Tendon: No Tendon: No Muscle: No Muscle: No Muscle: No Joint: No Joint: No Joint: No Bone: No Bone: No Bone: No Limited to Skin Limited to Skin Limited to Skin Breakdown Breakdown Breakdown Epithelialization: None Medium (34-66%) N/A Periwound Skin Texture: Edema: Yes Edema: Yes Induration: Yes Edema: No Excoriation: No Callus: No Crepitus: No Fluctuance: No Friable: No Rash: No Scarring: No Periwound Skin Moist: Yes Moist: Yes Moist: Yes Moisture: Maceration: No Dry/Scaly: No Periwound Skin Color: Erythema: Yes Erythema: Yes Hemosiderin Staining: Yes Atrophie Blanche: No Cyanosis: No Ecchymosis: No Erythema: No Mottled: No Pallor: No Rubor: No Erythema Location: Circumferential Circumferential N/A Temperature: No Abnormality No Abnormality N/A Tenderness on Yes Yes No Palpation: Wound Preparation: Ulcer Cleansing: Other: Ulcer Cleansing: Other: Ulcer Cleansing: soap and water soap and water Rinsed/Irrigated with Saline Topical Anesthetic Topical Anesthetic Applied: None Applied: Other: lidocaine Topical Anesthetic 4% Applied: None LENNON, BOUTWELL ([id number]) Treatment Notes Electronic Signature(s) Signed: 09/05/2016 5:07:41 PM By: Elpidio Eric [name], RN Entered By: Elpidio Eric on 09/05/2016 14:42:49 [name]  ([id number]) -------------------------------------------------------------------------------- Multi-Disciplinary Care Plan Details Patient Name: [name] Date of Service: 09/05/2016 2:15 PM Medical Record Number: [id number] Patient Account Number: [id number] Date of Birth/Sex: Oct 28, 1946 (70 y.o. Male) Treating RN: [name], RN, [name], [place] Primary Care Physician: Oretha Milch Other Clinician: Referring Physician: Oretha Milch Treating Physician/Extender: Rudene Re in Treatment: 16 Active Inactive Abuse / Safety / Falls / Self Care Management Nursing Diagnoses: Potential for falls Goals: Patient will remain injury free Date Initiated: 03/01/2016 Goal Status: Active Interventions: Assess fall risk on admission and as needed Notes: Nutrition Nursing Diagnoses: Imbalanced nutrition Goals: Patient/caregiver agrees to and verbalizes understanding of need to use nutritional supplements and/or vitamins as prescribed Date Initiated: 03/01/2016 Goal Status: Active Interventions: Assess patient nutrition upon admission and as needed per policy Notes: Orientation to the Wound Care Program Nursing Diagnoses: Knowledge deficit related to the wound healing center program Goals: Patient/caregiver will verbalize understanding of the [facility] [address] REMON, QUINTO ([id number]) Date Initiated: 03/01/2016 Goal Status: Active Interventions: Provide education  on orientation to the wound center Notes: Pain, Acute or Chronic Nursing Diagnoses: Pain, acute or chronic: actual or potential Potential alteration in comfort, pain Goals: Patient will verbalize adequate pain control and receive pain control interventions during procedures as needed Date Initiated: 03/01/2016 Goal Status: Active Patient/caregiver will verbalize adequate pain control between visits Date Initiated: 03/01/2016 Goal Status: Active Interventions: Assess comfort goal upon admission Complete  pain assessment as per visit requirements Notes: Pressure Nursing Diagnoses: Knowledge deficit related to causes and risk factors for pressure ulcer development Knowledge deficit related to management of pressures ulcers Goals: Patient/caregiver will verbalize risk factors for pressure ulcer development Date Initiated: 03/01/2016 Goal Status: Active Interventions: Assess offloading mechanisms upon admission and as needed Notes: Wound/Skin Impairment Nursing Diagnoses: [name] OttoLINDLEY, Tyshan V. ([id number]) Impaired tissue integrity Goals: Ulcer/skin breakdown will have a volume reduction of 30% by week 4 Date Initiated: 03/01/2016 Goal Status: Active Ulcer/skin breakdown will have a volume reduction of 50% by week 8 Date Initiated: 03/01/2016 Goal Status: Active Ulcer/skin breakdown will have a volume reduction of 80% by week 12 Date Initiated: 03/01/2016 Goal Status: Active Interventions: Assess ulceration(s) every visit Notes: Electronic Signature(s) Signed: 09/05/2016 5:07:41 PM By: Elpidio EricAfful, Rita [name], RN Entered By: Elpidio EricAfful, Rita on 09/05/2016 14:42:09 [name] OttoLINDLEY, [name] ([id number]) -------------------------------------------------------------------------------- Pain Assessment Details Patient Name: [name] OttoLINDLEY, [name] Date of Service: 09/05/2016 2:15 PM Medical Record Number: [id number] Patient Account Number: [id number] Date of Birth/Sex: [birth year](70 y.o. Male) Treating RN: Phillis HaggisPinkerton, Debi Primary Care Physician: Oretha MilchSMITH, [name] Other Clinician: Referring Physician: Oretha MilchSMITH, [name] Treating Physician/Extender: Rudene ReBritto, Errol Weeks in Treatment: 4835 Active Problems Location of Pain Severity and Description of Pain Patient Has Paino No Site Locations With Dressing Change: No Pain Management and Medication Current Pain Management: Electronic Signature(s) Signed: 09/06/2016 5:06:12 PM By: Alejandro MullingPinkerton, Debra Entered By: Alejandro MullingPinkerton, Debra on 09/05/2016 14:14:31 [name] OttoLINDLEY, Nickolis V.  ([id number]) -------------------------------------------------------------------------------- Patient/Caregiver Education Details Patient Name: [name] OttoLINDLEY, Christorpher V. Date of Service: 09/05/2016 2:15 PM Medical Record Number: [id number] Patient Account Number: [id number] Date of Birth/Gender: [birth year] [age] y.o. Male) Treating RN: Phillis HaggisPinkerton, Debi Primary Care Physician: Oretha MilchSMITH, [name] Other Clinician: Referring Physician: Oretha MilchSMITH, [name] Treating Physician/Extender: Rudene ReBritto, Errol Weeks in Treatment: 7535 Education Assessment Education Provided To: Patient Education Topics Provided Wound/Skin Impairment: Handouts: Other: change dressing as ordered and do not get dressing wet Methods: Demonstration, Explain/Verbal Responses: State content correctly Electronic Signature(s) Signed: 09/06/2016 5:06:12 PM By: Alejandro MullingPinkerton, Debra Entered By: Alejandro MullingPinkerton, Debra on 09/05/2016 14:38:42 [name], [name] ([id number]) -------------------------------------------------------------------------------- Wound Assessment Details Patient Name: [name] OttoLINDLEY, Divonte V. Date of Service: 09/05/2016 2:15 PM Medical Record Number: [id number] Patient Account Number: [id number] Date of Birth/Sex: [birth year] [age] y.o. Male) Treating RN: Phillis HaggisPinkerton, Debi Primary Care Physician: Oretha MilchSMITH, [name] Other Clinician: Referring Physician: Oretha MilchSMITH, [name] Treating Physician/Extender: Rudene ReBritto, Errol Weeks in Treatment: 35 Wound Status Wound Number: 10 Primary Lymphedema Etiology: Wound Location: Right Lower Leg - Anterior Secondary Diabetic Wound/Ulcer of the Lower Wounding Event: Blister Etiology: Extremity Date Acquired: 08/22/2016 Wound Open Weeks Of Treatment: 2 Status: Clustered Wound: No Comorbid Cataracts, Chronic Obstructive History: Pulmonary Disease (COPD), Sleep Apnea, Angina, Congestive Heart Failure, Hypertension, Type II Diabetes, Gout, Osteoarthritis, Neuropathy Photos Photo Uploaded By: Alejandro MullingPinkerton, Debra on 09/05/2016  14:39:32 Wound Measurements Length: (cm) 0.1 Width: (cm) 0.1 Depth: (cm) 0.1 Area: (cm) 0.008 Volume: (cm) 0.001 % Reduction in Area: 99.3% % Reduction in Volume: 99.2% Epithelialization: None Tunneling: No Undermining: No Wound Description Classification: Partial Thickness Diabetic Severity [address](Wagner): Grade 1 Wound Margin: Distinct, outline attach Exudate Amount: Large Exudate Type: Serous  Exudate Color: amber [name], [name] ([id number]) Foul Odor After Cleansing: No ed Wound Bed Granulation Amount: Large (67-100%) Exposed Structure Granulation Quality: Red Fascia Exposed: No Necrotic Amount: None Present (0%) Fat Layer Exposed: No Tendon Exposed: No Muscle Exposed: No Joint Exposed: No Bone Exposed: No Limited to Skin Breakdown Periwound Skin Texture Texture Color No Abnormalities Noted: No No Abnormalities Noted: No Localized Edema: Yes Erythema: Yes Erythema Location: Circumferential Moisture No Abnormalities Noted: No Temperature / Pain Moist: Yes Temperature: No Abnormality Tenderness on Palpation: Yes Wound Preparation Ulcer Cleansing: Other: soap and water, Topical Anesthetic Applied: None Treatment Notes Wound #10 (Right, Anterior Lower Leg) 1. Cleansed with: Clean wound with Normal Saline Cleanse wound with antibacterial soap and water 2. Anesthetic Topical Lidocaine 4% cream to wound bed prior to debridement 5. Secondary Dressing Applied ABD Pad 7. Secured with Tape 3 Layer Compression System - Bilateral Notes unna to anchor, silver alginate Electronic Signature(s) Signed: 09/06/2016 5:06:12 PM By: Alejandro Mulling Entered By: Alejandro Mulling on 09/05/2016 14:32:27 Reaver, Add V. ([id number]) -------------------------------------------------------------------------------- Wound Assessment Details Patient Name: [name] Date of Service: 09/05/2016 2:15 PM Medical Record Number: [id number] Patient Account Number:  [id number] Date of Birth/Sex: 22-Jun-1946 (70 y.o. Male) Treating RN: Phillis Haggis Primary Care Physician: Oretha Milch Other Clinician: Referring Physician: Oretha Milch Treating Physician/Extender: Rudene Re in Treatment: 35 Wound Status Wound Number: 3 Primary Pressure Ulcer Etiology: Wound Location: Right Calcaneus Wound Open Wounding Event: Pressure Injury Status: Date Acquired: 10/31/2015 Comorbid Cataracts, Chronic Obstructive Weeks Of Treatment: 35 History: Pulmonary Disease (COPD), Sleep Clustered Wound: No Apnea, Angina, Congestive Heart Failure, Hypertension, Type II Diabetes, Gout, Osteoarthritis, Neuropathy Photos Photo Uploaded By: Alejandro Mulling on 09/05/2016 14:39:33 Wound Measurements Length: (cm) 1 Width: (cm) 2.5 Depth: (cm) 0.1 Area: (cm) 1.963 Volume: (cm) 0.196 % Reduction in Area: 72.2% % Reduction in Volume: 86.1% Epithelialization: Medium (34-66%) Tunneling: No Undermining: No Wound Description Classification: Category/Stage III Foul Odor Diabetic Severity (Wagner): Grade 1 Wound Margin: Flat and Intact Exudate Amount: Large Exudate Type: Serous Exudate Color: amber After Cleansing: No Wound Bed Granulation Amount: Medium (34-66%) Exposed Structure [name], Dona V. ([id number]) Granulation Quality: Red, Pink Fascia Exposed: No Necrotic Amount: Medium (34-66%) Fat Layer Exposed: No Necrotic Quality: Adherent Slough Tendon Exposed: No Muscle Exposed: No Joint Exposed: No Bone Exposed: No Limited to Skin Breakdown Periwound Skin Texture Texture Color No Abnormalities Noted: No No Abnormalities Noted: No Localized Edema: Yes Erythema: Yes Erythema Location: Circumferential Moisture No Abnormalities Noted: No Temperature / Pain Moist: Yes Temperature: No Abnormality Tenderness on Palpation: Yes Wound Preparation Ulcer Cleansing: Other: soap and water, Topical Anesthetic Applied: Other: lidocaine 4%, Treatment  Notes Wound #3 (Right Calcaneus) 1. Cleansed with: Clean wound with Normal Saline Cleanse wound with antibacterial soap and water 2. Anesthetic Topical Lidocaine 4% cream to wound bed prior to debridement 4. Dressing Applied: Hydrogel Other dressing (specify in notes) 5. Secondary Dressing Applied Dry Gauze Kerlix/Conform 7. Secured with Tape Notes heel cup, sorbact Electronic Signature(s) Signed: 09/06/2016 5:06:12 PM By: Alejandro Mulling Entered By: Alejandro Mulling on 09/05/2016 14:34:35 Matusek, Ziyad V. ([id number]) -------------------------------------------------------------------------------- Wound Assessment Details Patient Name: [name] Date of Service: 09/05/2016 2:15 PM Medical Record Number: [id number] Patient Account Number: [id number] Date of Birth/Sex: January 19, 1946 (70 y.o. Male) Treating RN: Phillis Haggis Primary Care Physician: Oretha Milch Other Clinician: Referring Physician: Oretha Milch Treating Physician/Extender: Rudene Re in Treatment: 35 Wound Status Wound Number: 9 Primary Diabetic Wound/Ulcer of the Lower Etiology: Extremity Wound  Location: Left Lower Leg - Anterior Secondary Venous Leg Ulcer Wounding Event: Gradually Appeared Etiology: Date Acquired: 08/09/2016 Wound Open Weeks Of Treatment: 3 Status: Clustered Wound: Yes Comorbid Cataracts, Chronic Obstructive History: Pulmonary Disease (COPD), Sleep Apnea, Angina, Congestive Heart Failure, Hypertension, Type II Diabetes, Gout, Osteoarthritis, Neuropathy Photos Photo Uploaded By: Alejandro Mulling on 09/05/2016 14:39:48 Wound Measurements Length: (cm) 9.5 Width: (cm) 10 Depth: (cm) 0.1 Area: (cm) 74.613 Volume: (cm) 7.461 % Reduction in Area: 25.5% % Reduction in Volume: 25.5% Tunneling: No Undermining: No Wound Description Classification: Grade 2 Foul Odor Afte Wound Margin: Flat and Intact Exudate Amount: Large Exudate Type: Serous Exudate Color:  amber r Cleansing: No Wound Bed Leys, Yiannis V. ([id number]) Granulation Amount: Large (67-100%) Exposed Structure Granulation Quality: Red, Friable Fascia Exposed: No Necrotic Amount: Small (1-33%) Fat Layer Exposed: No Necrotic Quality: Adherent Slough Tendon Exposed: No Muscle Exposed: No Joint Exposed: No Bone Exposed: No Limited to Skin Breakdown Periwound Skin Texture Texture Color No Abnormalities Noted: No No Abnormalities Noted: No Callus: No Atrophie Blanche: No Crepitus: No Cyanosis: No Excoriation: No Ecchymosis: No Fluctuance: No Erythema: No Friable: No Hemosiderin Staining: Yes Induration: Yes Mottled: No Localized Edema: No Pallor: No Rash: No Rubor: No Scarring: No Moisture No Abnormalities Noted: No Dry / Scaly: No Maceration: No Moist: Yes Wound Preparation Ulcer Cleansing: Rinsed/Irrigated with Saline Topical Anesthetic Applied: None Treatment Notes Wound #9 (Left, Anterior Lower Leg) 1. Cleansed with: Clean wound with Normal Saline Cleanse wound with antibacterial soap and water 2. Anesthetic Topical Lidocaine 4% cream to wound bed prior to debridement 5. Secondary Dressing Applied ABD Pad 7. Secured with Tape 3 Layer Compression System - Bilateral Notes unna to anchor, silver alginate Gabler, [place] V. ([id number]) Electronic Signature(s) Signed: 09/06/2016 5:06:12 PM By: Alejandro Mulling Entered By: Alejandro Mulling on 09/05/2016 14:34:22 Hamblen, Mahki V. ([id number]) -------------------------------------------------------------------------------- Vitals Details Patient Name: [name] Date of Service: 09/05/2016 2:15 PM Medical Record Number: [id number] Patient Account Number: [id number] Date of Birth/Sex: Jul 29, 1946 (70 y.o. Male) Treating RN: Phillis Haggis Primary Care Physician: Oretha Milch Other Clinician: Referring Physician: Oretha Milch Treating Physician/Extender: Rudene Re in Treatment:  35 Vital Signs Time Taken: 14:14 Temperature (F): 98.1 Height (in): 70 Pulse (bpm): 62 Weight (lbs): 235 Respiratory Rate (breaths/min): 18 Body Mass Index (BMI): 33.7 Blood Pressure (mmHg): 124/60 Reference Range: 80 - 120 mg / dl Electronic Signature(s) Signed: 09/06/2016 5:06:12 PM By: Alejandro Mulling Entered By: Alejandro Mulling on 09/05/2016 14:15:59

## 2016-09-12 ENCOUNTER — Encounter: Payer: Medicare Other | Attending: Surgery | Admitting: Surgery

## 2016-09-12 DIAGNOSIS — L89613 Pressure ulcer of right heel, stage 3: Secondary | ICD-10-CM | POA: Insufficient documentation

## 2016-09-12 DIAGNOSIS — J449 Chronic obstructive pulmonary disease, unspecified: Secondary | ICD-10-CM | POA: Diagnosis not present

## 2016-09-12 DIAGNOSIS — I70234 Atherosclerosis of native arteries of right leg with ulceration of heel and midfoot: Secondary | ICD-10-CM | POA: Insufficient documentation

## 2016-09-12 DIAGNOSIS — I2510 Atherosclerotic heart disease of native coronary artery without angina pectoris: Secondary | ICD-10-CM | POA: Diagnosis not present

## 2016-09-12 DIAGNOSIS — M70871 Other soft tissue disorders related to use, overuse and pressure, right ankle and foot: Secondary | ICD-10-CM | POA: Diagnosis not present

## 2016-09-12 DIAGNOSIS — I110 Hypertensive heart disease with heart failure: Secondary | ICD-10-CM | POA: Insufficient documentation

## 2016-09-12 DIAGNOSIS — I890 Lymphedema, not elsewhere classified: Secondary | ICD-10-CM | POA: Diagnosis not present

## 2016-09-12 DIAGNOSIS — I5032 Chronic diastolic (congestive) heart failure: Secondary | ICD-10-CM | POA: Insufficient documentation

## 2016-09-12 DIAGNOSIS — E11621 Type 2 diabetes mellitus with foot ulcer: Secondary | ICD-10-CM | POA: Insufficient documentation

## 2016-09-12 DIAGNOSIS — Z6833 Body mass index (BMI) 33.0-33.9, adult: Secondary | ICD-10-CM | POA: Insufficient documentation

## 2016-09-12 DIAGNOSIS — G4733 Obstructive sleep apnea (adult) (pediatric): Secondary | ICD-10-CM | POA: Insufficient documentation

## 2016-09-12 DIAGNOSIS — Z87891 Personal history of nicotine dependence: Secondary | ICD-10-CM | POA: Diagnosis not present

## 2016-09-13 NOTE — Progress Notes (Signed)
[name], [name] ([id number]) Visit Report for 09/12/2016 Arrival Information Details Patient Name: [name], NIED. Date of Service: 09/12/2016 12:45 PM Medical Record Number: [id number] Patient Account Number: [id number] Date of Birth/Sex: 03-10-1946 (70 y.o. Male) Treating RN: [name] Primary Care Physician: [name] Other Clinician: Referring Physician: [name] Treating Physician/Extender: [name] in Treatment: 37 Visit Information History Since Last Visit Added or deleted any medications: No Patient Arrived: Wheel Chair Any new allergies or adverse reactions: No Arrival Time: 12:50 Had a fall or experienced change in No activities of daily living that may affect Accompanied By: self risk of falls: Transfer Assistance: None Signs or symptoms of abuse/neglect since last No Patient Identification Verified: Yes visito Secondary Verification Process Yes Hospitalized since last visit: No Completed: Pain Present Now: Yes Patient Requires Transmission-Based No Precautions: Patient Has Alerts: Yes Patient Alerts: DM II Electronic Signature(s) Signed: 09/12/2016 5:40:43 PM By: [name] Entered By: [name] on 09/12/2016 13:02:18 [name] ([id number]) -------------------------------------------------------------------------------- Compression Therapy Details Patient Name: [name] Date of Service: 09/12/2016 12:45 PM Medical Record Number: [id number] Patient Account Number: [id number] Date of Birth/Sex: 1946-09-20 (70 y.o. Male) Treating RN: [name] Mealy, RN, [name], [name] Primary Care Physician: [name] Other Clinician: Referring Physician: [name] Treating Physician/Extender: [name] in Treatment: 36 Compression Therapy Performed for Wound Wound #10 Right,Anterior Lower Leg Assessment: Performed By: Clinician [name] Sites, RN Compression Type: Three Layer Post Procedure Diagnosis Same as  Pre-procedure Electronic Signature(s) Signed: 09/12/2016 4:05:52 PM By: [name] [name], RN Entered By: [name] on 09/12/2016 16:05:52 [name] ([id number]) -------------------------------------------------------------------------------- Compression Therapy Details Patient Name: [name] Date of Service: 09/12/2016 12:45 PM Medical Record Number: [id number] Patient Account Number: [id number] Date of Birth/Sex: 26-Nov-1945 (70 y.o. Male) Treating RN: [name] Mealy, RN, [name], [name] Primary Care Physician: [name] Other Clinician: Referring Physician: [name] Treating Physician/Extender: [name] in Treatment: 36 Compression Therapy Performed for Wound Wound #9 Left,Anterior Lower Leg Assessment: Performed By: Clinician [name] Sites, RN Compression Type: Three Layer Post Procedure Diagnosis Same as Pre-procedure Electronic Signature(s) Signed: 09/12/2016 4:05:53 PM By: [name] [name], RN Entered By: [name] on 09/12/2016 16:05:53 [name] ([id number]) -------------------------------------------------------------------------------- Encounter Discharge Information Details Patient Name: [name] Date of Service: 09/12/2016 12:45 PM Medical Record Number: [id number] Patient Account Number: [id number] Date of Birth/Sex: 10-16-1946 (70 y.o. Male) Treating RN: [name] Primary Care Physician: [name] Other Clinician: Referring Physician: [name] Treating Physician/Extender: [name] in Treatment: 22 Encounter Discharge Information Items Discharge Pain Level: 0 Discharge Condition: Stable Ambulatory Status: Wheelchair Discharge Destination: Home Transportation: Private Auto Accompanied By: self Schedule Follow-up Appointment: Yes Medication Reconciliation completed and provided to Patient/Care No [name]: Provided on Clinical Summary of Care: 09/12/2016 Form Type Recipient Paper Patient EL Electronic  Signature(s) Signed: 09/12/2016 2:29:41 PM By: [name] Previous Signature: 09/12/2016 1:44:31 PM Version By: Gwenlyn Perking Entered By: [name] on 09/12/2016 14:29:41 [name] ([id number]) -------------------------------------------------------------------------------- Lower Extremity Assessment Details Patient Name: [name] Date of Service: 09/12/2016 12:45 PM Medical Record Number: [id number] Patient Account Number: [id number] Date of Birth/Sex: 1946/04/06 (70 y.o. Male) Treating RN: [name] Primary Care Physician: [name] Other Clinician: Referring Physician: [name] Treating Physician/Extender: [name] in Treatment: 36 Edema Assessment Assessed: [Left: No] [Right: No] Edema: [Left: Yes] [Right: Yes] Calf Left: Right: Point of Measurement: 34 cm From Medial Instep 38.3 cm 34 cm Ankle Left: Right: Point of Measurement: 12  cm From Medial Instep 26 cm 25 cm Vascular Assessment Pulses: Posterior Tibial Dorsalis Pedis Palpable: [Left:Yes] [Right:Yes] Extremity colors, hair growth, and conditions: Extremity Color: [Left:Hyperpigmented] [Right:Hyperpigmented] Hair Growth on Extremity: [Left:No] [Right:No] Temperature of Extremity: [Left:Warm] [Right:Warm] Capillary Refill: [Left:< 3 seconds] [Right:< 3 seconds] Electronic Signature(s) Signed: 09/12/2016 5:40:43 PM By: [name] Sitesorthy, [name] Entered By: [name] Sitesorthy, [name] on 09/12/2016 13:10:06 [name], [name] -------------------------------------------------------------------------------- Multi Wound Chart Details Patient Name: [name], [name] Date of Service: 09/12/2016 12:45 PM Medical Record Number: [id number] Patient Account Number: [id number] Date of Birth/Sex: 05-23-1946 10(70 y.o. Male) Treating RN: [name] MealyAfful, RN, [name],  [name] Primary Care Physician: [name] MilchSMITH, [name] Other Clinician: Referring Physician: Oretha MilchSMITH, [name] Treating Physician/Extender: [name] ReBritto, Errol Weeks in  Treatment: 36 Vital Signs Height(in): 70 Pulse(bpm): 62 Weight(lbs): 235 Blood Pressure 128/62 (mmHg): Body Mass Index(BMI): 34 Temperature(F): 98.0 Respiratory Rate 18 (breaths/min): Photos: Wound Location: Right Lower Leg - Anterior Right Calcaneus Left Lower Leg - Anterior Wounding Event: Blister Pressure Injury Gradually Appeared Primary Etiology: Lymphedema Pressure Ulcer 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 Comorbid History: Cataracts, Chronic Cataracts, Chronic Cataracts, Chronic Obstructive Pulmonary Obstructive Pulmonary Obstructive Pulmonary Disease (COPD), Sleep Disease (COPD), Sleep Disease (COPD), Sleep Apnea, Angina, Apnea, Angina, Apnea, Angina, Congestive Heart Failure, Congestive Heart Failure, Congestive Heart Failure, Hypertension, Type II Hypertension, Type II Hypertension, Type II Diabetes, Gout, Diabetes, Gout, Diabetes, Gout, Osteoarthritis, Neuropathy Osteoarthritis, Neuropathy Osteoarthritis, Neuropathy Date Acquired: 08/22/2016 10/31/2015 08/09/2016 Weeks of Treatment: 3 36 4 Wound Status: Open Open Open Clustered Wound: No No Yes Measurements L x W x D 0.1x0.1x0.1 1x2.3x0.2 8.5x8x0.1 (cm) Area (cm) : 0.008 1.806 53.407 Volume (cm) : 0.001 0.361 5.341 % Reduction in Area: 99.30% 74.50% 46.70% [name], [name] ([id number]) % Reduction in Volume: 99.20% 74.50% 46.70% Classification: Partial Thickness Category/Stage III Grade 2 HBO Classification: Grade 1 Grade 1 N/A Exudate Amount: Large Large Large Exudate Type: Serous Serous Serous Exudate Color: amber amber amber Wound Margin: Distinct, outline attached Flat and Intact Flat and Intact Granulation Amount: Large (67-100%) Medium (34-66%) Large (67-100%) Granulation Quality: Red Red, Pink Red, Friable Necrotic Amount: None Present (0%) Medium (34-66%) Small (1-33%) Exposed Structures: Fascia:  No Fascia: No Fascia: No Fat: No Fat: No Fat: No Tendon: No Tendon: No Tendon: No Muscle: No Muscle: No Muscle: No Joint: No Joint: No Joint: No Bone: No Bone: No Bone: No Limited to Skin Limited to Skin Limited to Skin Breakdown Breakdown Breakdown Epithelialization: None Medium (34-66%) Large (67-100%) Periwound Skin Texture: Edema: Yes Edema: Yes Induration: Yes Edema: No Excoriation: No Callus: No Crepitus: No Fluctuance: No Friable: No Rash: No Scarring: No Periwound Skin Moist: Yes Moist: Yes Moist: Yes Moisture: Maceration: No Dry/Scaly: No Periwound Skin Color: Erythema: Yes Erythema: Yes Hemosiderin Staining: Yes Atrophie Blanche: No Cyanosis: No Ecchymosis: No Erythema: No Mottled: No Pallor: No Rubor: No Erythema Location: Circumferential Circumferential N/A Temperature: No Abnormality No Abnormality N/A Tenderness on Yes Yes No Palpation: Wound Preparation: Ulcer Cleansing: Other: Ulcer Cleansing: Other: Ulcer Cleansing: soap and water soap and water Rinsed/Irrigated with Saline Topical Anesthetic Topical Anesthetic Applied: None Applied: Other: lidocaine Topical Anesthetic 4% Applied: None [name], [name] ([id number]) Treatment Notes Electronic Signature(s) Signed: 09/12/2016 5:20:58 PM By: [name] EricAfful, Rita [name], RN Entered By: [name] EricAfful, Rita on 09/12/2016 13:22:12 [name], [name] ([id number]) -------------------------------------------------------------------------------- Multi-Disciplinary Care Plan Details Patient Name: [name], [name] Date of Service: 09/12/2016 12:45 PM Medical Record Number: [id number] Patient Account Number: [id number] Date of Birth/Sex:  03/25/1946 (70 y.o. Male) Treating RN: [name], RN, [name], [place] [name] Primary Care Physician: [name] Other Clinician: Referring Physician: [name] Treating Physician/Extender: [name] in Treatment: 55 Active Inactive Abuse / Safety / Falls / Self Care  Management Nursing Diagnoses: Potential for falls Goals: Patient will remain injury free Date Initiated: 03/01/2016 Goal Status: Active Interventions: Assess fall risk on admission and as needed Notes: Nutrition Nursing Diagnoses: Imbalanced nutrition Goals: Patient/caregiver agrees to and verbalizes understanding of need to use nutritional supplements and/or vitamins as prescribed Date Initiated: 03/01/2016 Goal Status: Active Interventions: Assess patient nutrition upon admission and as needed per policy Notes: Orientation to the Wound Care Program Nursing Diagnoses: Knowledge deficit related to the wound healing center program Goals: Patient/caregiver will verbalize understanding of the [facility] [address] ANISH, VANA ([id number]) Date Initiated: 03/01/2016 Goal Status: Active Interventions: Provide education on orientation to the wound center Notes: Pain, Acute or Chronic Nursing Diagnoses: Pain, acute or chronic: actual or potential Potential alteration in comfort, pain Goals: Patient will verbalize adequate pain control and receive pain control interventions during procedures as needed Date Initiated: 03/01/2016 Goal Status: Active Patient/caregiver will verbalize adequate pain control between visits Date Initiated: 03/01/2016 Goal Status: Active Interventions: Assess comfort goal upon admission Complete pain assessment as per visit requirements Notes: Pressure Nursing Diagnoses: Knowledge deficit related to causes and risk factors for pressure ulcer development Knowledge deficit related to management of pressures ulcers Goals: Patient/caregiver will verbalize risk factors for pressure ulcer development Date Initiated: 03/01/2016 Goal Status: Active Interventions: Assess offloading mechanisms upon admission and as needed Notes: Wound/Skin Impairment Nursing Diagnoses: WILLLIAM, PETTET ([id number]) Impaired tissue  integrity Goals: Ulcer/skin breakdown will have a volume reduction of 30% by week 4 Date Initiated: 03/01/2016 Goal Status: Active Ulcer/skin breakdown will have a volume reduction of 50% by week 8 Date Initiated: 03/01/2016 Goal Status: Active Ulcer/skin breakdown will have a volume reduction of 80% by week 12 Date Initiated: 03/01/2016 Goal Status: Active Interventions: Assess ulceration(s) every visit Notes: Electronic Signature(s) Signed: 09/12/2016 5:20:58 PM By: [name] [name], RN Entered By: [name] on 09/12/2016 13:21:30 [name] ([id number]) -------------------------------------------------------------------------------- Pain Assessment Details Patient Name: [name] Date of Service: 09/12/2016 12:45 PM Medical Record Number: [id number] Patient Account Number: [id number] Date of Birth/Sex: Sep 26, 1946 (70 y.o. Male) Treating RN: [name] Primary Care Physician: [name] Other Clinician: Referring Physician: [name] Treating Physician/Extender: [name] in Treatment: 59 Active Problems Location of Pain Severity and Description of Pain Patient Has Paino Yes Site Locations Pain Location: Pain in Ulcers With Dressing Change: Yes Duration of the Pain. Constant / Intermittento Intermittent Rate the pain. Current Pain Level: 0 Pain Management and Medication Current Pain Management: Notes Topical or injectable lidocaine is offered to patient for acute pain when surgical debridement is performed. If needed, Patient is instructed to use over the counter pain medication for the following 24-48 hours after debridement. Wound care MDs do not prescribed pain medications. Patient has chronic pain or uncontrolled pain. Patient has been instructed to make an appointment with their Primary Care Physician for pain management. Electronic Signature(s) Signed: 09/12/2016 5:40:43 PM By: [name] Entered By: [name] on 09/12/2016  13:02:34 [name] ([id number]) -------------------------------------------------------------------------------- Patient/Caregiver Education Details Patient Name: [name] Date of Service: 09/12/2016 12:45 PM Medical Record Number: [id number] Patient Account Number: [id number] Date of Birth/Gender: [birth year] (70 y.o. Male) Treating RN: [name] Primary Care Physician: [name] Other Clinician: Referring Physician: Katrinka Blazing,  [name] Treating Physician/Extender: [name] in Treatment: 68 Education Assessment Education Provided To: Patient Education Topics Provided Venous: Handouts: Other: leg elevation Methods: Explain/Verbal Responses: State content correctly Electronic Signature(s) Signed: 09/12/2016 5:40:43 PM By: [name] Entered By: [name] on 09/12/2016 14:29:59 Lukacs, Deklen Seth [name] ([id number]) -------------------------------------------------------------------------------- Wound Assessment Details Patient Name: [name] Date of Service: 09/12/2016 12:45 PM Medical Record Number: [id number] Patient Account Number: [id number] Date of Birth/Sex: [birth year] (70 y.o. Male) Treating RN: [name] Primary Care Physician: [name] Other Clinician: Referring Physician: [name] Treating Physician/Extender: [name] in Treatment: 36 Wound Status Wound Number: 10 Primary Lymphedema Etiology: Wound Location: Right Lower Leg - Anterior Secondary Diabetic Wound/Ulcer of the Lower Wounding Event: Blister Etiology: Extremity Date Acquired: 08/22/2016 Wound Open Weeks Of Treatment: 3 Status: Clustered Wound: No Comorbid Cataracts, Chronic Obstructive History: Pulmonary Disease (COPD), Sleep Apnea, Angina, Congestive Heart Failure, Hypertension, Type II Diabetes, Gout, Osteoarthritis, Neuropathy Photos Wound Measurements Length: (cm) 0.1 Width: (cm) 0.1 Depth: (cm) 0.1 Area: (cm) 0.008 Volume: (cm)  0.001 % Reduction in Area: 99.3% % Reduction in Volume: 99.2% Epithelialization: None Wound Description Classification: Partial Thickness Diabetic Severity [address]): Grade 1 Wound Margin: Distinct, outline attach Exudate Amount: Large Exudate Type: Serous Exudate Color: amber Foul Odor After Cleansing: No ed Wound Bed [name], [name] ([id number]) Granulation Amount: Large (67-100%) Exposed Structure Granulation Quality: Red Fascia Exposed: No Necrotic Amount: None Present (0%) Fat Layer Exposed: No Tendon Exposed: No Muscle Exposed: No Joint Exposed: No Bone Exposed: No Limited to Skin Breakdown Periwound Skin Texture Texture Color No Abnormalities Noted: No No Abnormalities Noted: No Localized Edema: Yes Erythema: Yes Erythema Location: Circumferential Moisture No Abnormalities Noted: No Temperature / Pain Moist: Yes Temperature: No Abnormality Tenderness on Palpation: Yes Wound Preparation Ulcer Cleansing: Other: soap and water, Topical Anesthetic Applied: None Treatment Notes Wound #10 (Right, Anterior Lower Leg) 1. Cleansed with: Clean wound with Normal Saline Cleanse wound with antibacterial soap and water 2. Anesthetic Topical Lidocaine 4% cream to wound bed prior to debridement 5. Secondary Dressing Applied ABD Pad 7. Secured with Tape 3 Layer Compression System - Bilateral Notes unna to anchor, silver alginate Electronic Signature(s) Signed: 09/12/2016 5:40:43 PM By: [name] Entered By: [name] on 09/12/2016 13:08:41 [name], [name] Kitchen ([id number]) -------------------------------------------------------------------------------- Wound Assessment Details Patient Name: [name] Date of Service: 09/12/2016 12:45 PM Medical Record Number: [id number] Patient Account Number: [id number] Date of Birth/Sex: 05/23/1946 (70 y.o. Male) Treating RN: [name] Primary Care Physician: [name] Other Clinician: Referring Physician:  [name] Treating Physician/Extender: [name] in Treatment: 36 Wound Status Wound Number: 3 Primary Pressure Ulcer Etiology: Wound Location: Right Calcaneus Wound Open Wounding Event: Pressure Injury Status: Date Acquired: 10/31/2015 Comorbid Cataracts, Chronic Obstructive Weeks Of Treatment: 36 History: Pulmonary Disease (COPD), Sleep Clustered Wound: No Apnea, Angina, Congestive Heart Failure, Hypertension, Type II Diabetes, Gout, Osteoarthritis, Neuropathy Photos Wound Measurements Length: (cm) 1 Width: (cm) 2.3 Depth: (cm) 0.2 Area: (cm) 1.806 Volume: (cm) 0.361 % Reduction in Area: 74.5% % Reduction in Volume: 74.5% Epithelialization: Medium (34-66%) Tunneling: No Undermining: No Wound Description Classification: Category/Stage III Foul Odor Diabetic Severity (Wagner): Grade 1 Wound Margin: Flat and Intact Exudate Amount: Large Exudate Type: Serous Exudate Color: amber After Cleansing: No Wound Bed Granulation Amount: Medium (34-66%) Exposed Structure Granulation Quality: Red, Pink Fascia Exposed: No [name], [name] ([id number]) Necrotic Amount: Medium (34-66%) Fat Layer Exposed: No Necrotic Quality: Adherent Slough Tendon Exposed: No Muscle Exposed: No Joint  Exposed: No Bone Exposed: No Limited to Skin Breakdown Periwound Skin Texture Texture Color No Abnormalities Noted: No No Abnormalities Noted: No Localized Edema: Yes Erythema: Yes Erythema Location: Circumferential Moisture No Abnormalities Noted: No Temperature / Pain Moist: Yes Temperature: No Abnormality Tenderness on Palpation: Yes Wound Preparation Ulcer Cleansing: Other: soap and water, Topical Anesthetic Applied: Other: lidocaine 4%, Treatment Notes Wound #3 (Right Calcaneus) 1. Cleansed with: Cleanse wound with antibacterial soap and water 2. Anesthetic Topical Lidocaine 4% cream to wound bed prior to debridement 4. Dressing Applied: Hydrafera Blue 5.  Secondary Dressing Applied Dry Gauze Foam Kerlix/Conform Notes heel cup Electronic Signature(s) Signed: 09/12/2016 5:40:43 PM By: [name] Entered By: [name] on 09/12/2016 13:09:04 [name], [name] ([id number]) -------------------------------------------------------------------------------- Wound Assessment Details Patient Name: [name] Date of Service: 09/12/2016 12:45 PM Medical Record Number: [id number] Patient Account Number: [id number] Date of Birth/Sex: 1945/11/21 (70 y.o. Male) Treating RN: [name] Primary Care Physician: [name] Other Clinician: Referring Physician: [name] Treating Physician/Extender: [name] in Treatment: 36 Wound Status Wound Number: 9 Primary Diabetic Wound/Ulcer of the Lower Etiology: Extremity Wound Location: Left Lower Leg - Anterior Secondary Venous Leg Ulcer Wounding Event: Gradually Appeared Etiology: Date Acquired: 08/09/2016 Wound Open Weeks Of Treatment: 4 Status: Clustered Wound: Yes Comorbid Cataracts, Chronic Obstructive History: Pulmonary Disease (COPD), Sleep Apnea, Angina, Congestive Heart Failure, Hypertension, Type II Diabetes, Gout, Osteoarthritis, Neuropathy Photos Wound Measurements Length: (cm) 8.5 Width: (cm) 8 Depth: (cm) 0.1 Area: (cm) 53.407 Volume: (cm) 5.341 % Reduction in Area: 46.7% % Reduction in Volume: 46.7% Epithelialization: Large (67-100%) Tunneling: No Undermining: No Wound Description Classification: Grade 2 Foul Odor Afte Wound Margin: Flat and Intact Exudate Amount: Large Exudate Type: Serous Exudate Color: amber r Cleansing: No Wound Bed Granulation Amount: Large (67-100%) Exposed Structure Shull, Yavier [name]. ([id number]) Granulation Quality: Red, Friable Fascia Exposed: No Necrotic Amount: Small (1-33%) Fat Layer Exposed: No Necrotic Quality: Adherent Slough Tendon Exposed: No Muscle Exposed: No Joint Exposed: No Bone Exposed:  No Limited to Skin Breakdown Periwound Skin Texture Texture Color No Abnormalities Noted: No No Abnormalities Noted: No Callus: No Atrophie Blanche: No Crepitus: No Cyanosis: No Excoriation: No Ecchymosis: No Fluctuance: No Erythema: No Friable: No Hemosiderin Staining: Yes Induration: Yes Mottled: No Localized Edema: No Pallor: No Rash: No Rubor: No Scarring: No Moisture No Abnormalities Noted: No Dry / Scaly: No Maceration: No Moist: Yes Wound Preparation Ulcer Cleansing: Rinsed/Irrigated with Saline Topical Anesthetic Applied: None Treatment Notes Wound #9 (Left, Anterior Lower Leg) 1. Cleansed with: Clean wound with Normal Saline Cleanse wound with antibacterial soap and water 2. Anesthetic Topical Lidocaine 4% cream to wound bed prior to debridement 5. Secondary Dressing Applied ABD Pad 7. Secured with Tape 3 Layer Compression System - Bilateral Notes unna to anchor, silver alginate Electronic Signature(s) AMEER, SANDEN ([id number]) Signed: 09/12/2016 5:40:43 PM By: [name] Entered By: [name] on 09/12/2016 13:09:28 [name] ([id number]) -------------------------------------------------------------------------------- Vitals Details Patient Name: [name] Date of Service: 09/12/2016 12:45 PM Medical Record Number: [id number] Patient Account Number: [id number] Date of Birth/Sex: 1946-06-17 (70 y.o. Male) Treating RN: [name] Primary Care Physician: [name] Other Clinician: Referring Physician: [name] Treating Physician/Extender: [name] in Treatment: 14 Vital Signs Time Taken: 13:02 Temperature (F): 98.0 Height (in): 70 Pulse (bpm): 62 Weight (lbs): 235 Respiratory Rate (breaths/min): 18 Body Mass Index (BMI): 33.7 Blood Pressure (mmHg): 128/62 Reference Range: 80 - 120 mg / dl Electronic Signature(s) Signed: 09/12/2016 5:40:43  PM By: [name] Sitesorthy, [name] Entered By: [name] Sitesorthy, [name] on  09/12/2016 13:03:01

## 2016-09-13 NOTE — Progress Notes (Signed)
SHANT, HENCE ([id number]) Visit Report for 09/12/2016 Chief Complaint Document Details Patient Name: [name], [name] Date of Service: 09/12/2016 12:45 PM Medical Record Number: [id number] Patient Account Number: [id number] Date of Birth/Sex: 1946/01/30 (70 y.o. Male) Treating RN: [name] Primary Care Physician: Oretha Milch Other Clinician: Referring Physician: Oretha Milch Treating Physician/Extender: Rudene Re in Treatment: 60 Information Obtained from: Patient Chief Complaint Patient is at the clinic for treatment of an open pressure ulcer the left upper thigh and gluteal region and the right heel with bilateral swelling of the legs all of it which is going on for over a year Electronic Signature(s) Signed: 09/12/2016 1:34:00 PM By: Evlyn Kanner MD, FACS Entered By: Evlyn Kanner on 09/12/2016 13:34:00 [name] ([id number]) -------------------------------------------------------------------------------- Debridement Details Patient Name: [name] Date of Service: 09/12/2016 12:45 PM Medical Record Number: [id number] Patient Account Number: [id number] Date of Birth/Sex: 07/19/1946 (70 y.o. Male) Treating RN: [name] Primary Care Physician: Oretha Milch Other Clinician: Referring Physician: Oretha Milch Treating Physician/Extender: Rudene Re in Treatment: 36 Debridement Performed for Wound #3 Right Calcaneus Assessment: Performed By: Physician Evlyn Kanner, MD Debridement: Debridement Pre-procedure Yes - 13:21 Verification/Time Out Taken: Start Time: 13:21 Pain Control: Lidocaine 4% Topical Solution Level: Skin/Subcutaneous Tissue Total Area Debrided (L x 1 (cm) x 2.3 (cm) = 2.3 (cm) W): Tissue and other Viable, Non-Viable, Exudate, Fat, Fibrin/Slough, Subcutaneous material debrided: Instrument: Curette Bleeding: Minimum Hemostasis Achieved: Pressure End Time: 13:25 Procedural Pain: 0 Post Procedural Pain:  0 Response to Treatment: Procedure was tolerated well Post Debridement Measurements of Total Wound Length: (cm) 1 Stage: Category/Stage III Width: (cm) 2.3 Depth: (cm) 0.2 Volume: (cm) 0.361 Character of Wound/Ulcer Post Requires Further Debridement: Debridement Severity of Tissue Post Fat layer exposed Debridement: Post Procedure Diagnosis Same as Pre-procedure Electronic Signature(s) Signed: 09/12/2016 1:33:47 PM By: Evlyn Kanner MD, FACS Signed: 09/12/2016 5:40:43 PM By: [name] Entered By: Evlyn Kanner on 09/12/2016 13:33:47 [name], [name] ([id number]) [name], [name] ([id number]) -------------------------------------------------------------------------------- HPI Details Patient Name: [name] Date of Service: 09/12/2016 12:45 PM Medical Record Number: [id number] Patient Account Number: [id number] Date of Birth/Sex: 1946/05/30 (70 y.o. Male) Treating RN: [name] Primary Care Physician: Oretha Milch Other Clinician: Referring Physician: Oretha Milch Treating Physician/Extender: Rudene Re in Treatment: 87 History of Present Illness Location: ulcerated area on the right heel, left gluteal region and thigh and then bilateral lower extremities Quality: Patient reports experiencing a sharp pain to affected area(s). Severity: Patient states wound are getting worse. Duration: Patient has had the wound for > 12 months prior to seeking treatment at the wound center Timing: Pain in wound is constant (hurts all the time) Context: The wound appeared gradually over time Modifying Factors: Other treatment(s) tried include: he sees his heart doctor and his primary care doctor Associated Signs and Symptoms: patient has not been able to walk for over a year now HPI Description: 70 year old gentleman with a known history of hypertension, diabetes, obstructive sleep apnea, COPD, diastolic CHF, coronary artery disease was admitted to the hospital with  sepsis from an ulcer of the right heel and was treated there in October 2016. he also has chronic bilateral lower extremity edema and lymphedema. He had received vancomycin, Zosyn and at that stage and x-rays showed hardware in the right ankle but no evidence of osteomyelitis. he was a former smoker. he was also treated with Augmentin orally for 10 days. He is either bedbound or wheelchair-bound and does not ambulate by himself.  01/19/2016 -- he has not been seen here for 3 weeks and this was because he was admitted to [facility] between 223 and 01/08/2016 for sepsis, UTI and pneumonia involving the left lung. he was treated with IV vancomycin and Zosyn and then meropenem. Was discharged home on oral Bactrim for 2 weeks none of his vascular test or x-rays were done and we will reorder these. 01/26/2016 -- he has not yet done the x-ray of his foot and is vascular tests are still pending. I have asked him to work on these with his nursing home staff. Addendum: he has got an x-ray of the right foot done which shows that his osteopenia but no specific ostial lysis or abnormal periosteal reaction. Final impression was degenerative changes with dorsal foot soft tissue swelling. 02/16/2016 -- lower extremity arterial duplex examination shows a 50-99% stenosis of the right tibioperoneal trunk. He had biphasic flow in the right SFA, popliteal and tibioperoneal trunk. Left-sided he had triphasic flow throughout 02/29/2016 -- he is awaiting his vascular opinion with Dr. Gilda Crease which is to be done on April 24 03/08/2016 -- he has not kept his appointment with Dr. Gilda Crease on April 24 and does not seem to know what happened about this. it's difficult to gauge whether he is in full control of his mental faculties as at times he is extremely rude to the nursing staff. 03/22/2016 -- the patient was seen by Dr. 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 Recommended that the patient had severe atherosclerotic changes of both lower extremities associated with ulceration and tissue loss of the foot. This is a limb threatening ischemia and the patient was recommended to undergo [name], [name] ([id number]) angiography of the lower extremity with a hope for intervention for limb salvage. Patient agreed and will proceed to angiography. He was admitted to the hospital between May 2 and 03/15/2016 - he underwent induction of a catheter into his right lower extremity and third order catheter placement with contrast injection to the right lower extremity for distal runoff. Percutaneous transluminal angioplasty of the right superficial femoral and popliteal arteries were done. He also had a right peroneal angioplasty. Patient had a postoperative hematoma and was admitted for observation and in the next 24 hours he was very agitated and combative and had to be seen by psychiatric. He had a follow-up with Dr. Gilda Crease in 3 weeks. 04/18/2016 -- notes reviewed from the vascular office where he was seen for his postop visit after the PTA of the right SFA and popliteal arteries on 03/12/2016. He underwent an ABI which showed right ABI to be more than 1.3 and left to be more than 1 more than 1.3, great toe and PPG waveforms are decreased bilaterally. No additional intervention indicated at this time and the patient was to follow-up in 3 months with an ABI and bilateral lower extremity duplex study. 05/02/2016 -- he complains that his compression stockings are causing him a lot of discomfort and he is not happy wearing them. 05/30/2016 -- the swelling of both legs has increased again and he has had blisters which have opened out into ulcerations. 09/12/2016 -- the lymphedema pumps are still not delivered and hopefully will be done within this week. As far as his skin  substitute goes we are still awaiting a response from his Designer, industrial/product) Signed: 09/12/2016 1:35:14 PM By: Evlyn Kanner  MD, FACS Entered By: Evlyn Kanner on 09/12/2016 13:35:14 [name] ([id number]) -------------------------------------------------------------------------------- Physical Exam Details Patient Name: [name] Date of Service: 09/12/2016 12:45 PM Medical Record Number: [id number] Patient Account Number: [id number] Date of Birth/Sex: 05/19/1946 (70 y.o. Male) Treating RN: [name] Primary Care Physician: Oretha Milch Other Clinician: Referring Physician: Oretha Milch Treating Physician/Extender: Rudene Re in Treatment: 36 Constitutional . Pulse regular. Respirations normal and unlabored. Afebrile. . Eyes Nonicteric. Reactive to light. Ears, Nose, Mouth, and Throat Lips, teeth, and gums WNL.Marland Kitchen Moist mucosa without lesions. Neck supple and nontender. No palpable supraclavicular or cervical adenopathy. Normal sized without goiter. Respiratory WNL. No retractions.. Cardiovascular Pedal Pulses WNL. No clubbing, cyanosis or edema. Lymphatic No adneopathy. No adenopathy. No adenopathy. Musculoskeletal Adexa without tenderness or enlargement.. Digits and nails w/o clubbing, cyanosis, infection, petechiae, ischemia, or inflammatory conditions.. Integumentary (Hair, Skin) No suspicious lesions. No crepitus or fluctuance. No peri-wound warmth or erythema. No masses.Marland Kitchen Psychiatric Judgement and insight Intact.. No evidence of depression, anxiety, or agitation.. Notes Lymphedema both lower extremities has increased significantly and he has also got pedal edema today. the right heel and calcaneal ulcer has some subcutaneous debris and sharply remove this with a #3 curet. Electronic Signature(s) Signed: 09/12/2016 1:36:29 PM By: Evlyn Kanner MD, FACS Entered By: Evlyn Kanner on 09/12/2016 13:36:28 [name]  ([id number]) -------------------------------------------------------------------------------- Physician Orders Details Patient Name: [name] Date of Service: 09/12/2016 12:45 PM Medical Record Number: [id number] Patient Account Number: [id number] Date of Birth/Sex: Jun 09, 1946 (70 y.o. Male) Treating RN: [name], RN, [name], Rogers Sink Primary Care Physician: Oretha Milch Other Clinician: Referring Physician: Oretha Milch Treating Physician/Extender: Rudene Re in Treatment: 62 Verbal / Phone Orders: Yes Clinician: [name], RN, [name], Rita Read Back and Verified: Yes Diagnosis Coding Wound Cleansing Wound #10 Right,Anterior Lower Leg o No tub bath. - only sink bath or bed bath Wound #3 Right Calcaneus o No tub bath. - only sink bath or bed bath Wound #9 Left,Anterior Lower Leg o No tub bath. - only sink bath or bed bath Anesthetic Wound #10 Right,Anterior Lower Leg o Topical Lidocaine 4% cream applied to wound bed prior to debridement - for clinic use only Wound #3 Right Calcaneus o Topical Lidocaine 4% cream applied to wound bed prior to debridement - for clinic use only Wound #9 Left,Anterior Lower Leg o Topical Lidocaine 4% cream applied to wound bed prior to debridement - for clinic use only Skin Barriers/Peri-Wound Care Wound #10 Right,Anterior Lower Leg o Barrier cream - around the heel wound to reddened and irritated areas Wound #3 Right Calcaneus o Barrier cream - around the heel wound to reddened and irritated areas Wound #9 Left,Anterior Lower Leg o Barrier cream - around the heel wound to reddened and irritated areas Primary Wound Dressing Wound #10 Right,Anterior Lower Leg o Sorbalgon Ag Wound #3 Right Calcaneus o Hydrafera Blue Giel, Chick V. ([id number]) Wound #9 Left,Anterior Lower Leg o Sorbalgon Ag Secondary Dressing Wound #3 Right Calcaneus o Dry Gauze - Heel cups Dressing Change Frequency Wound #10 Right,Anterior Lower  Leg o Change dressing every week Wound #3 Right Calcaneus o Change dressing every other day. - unless saturated or soiled Wound #9 Left,Anterior Lower Leg o Change dressing every week Follow-up Appointments Wound #3 Right Calcaneus o Return Appointment in 1 week. Edema Control o 3 Layer Compression System - Bilateral o Other: - unna to anchor Off-Loading Wound #3 Right Calcaneus o Turn and reposition every 2 hours o Other: - sage  boots at night and float heel when lying in bed Additional Orders / Instructions Wound #3 Right Calcaneus o Increase protein intake. Medications-please add to medication list. Wound #3 Right Calcaneus o Other: - Vitamin C, Vitamin A, Zinc, multivitamin Electronic Signature(s) Signed: 09/12/2016 4:11:15 PM By: Evlyn Kanner MD, FACS Signed: 09/12/2016 5:20:58 PM By: Elpidio Eric [name], RN Entered By: Elpidio Eric on 09/12/2016 13:24:34 [name] ([id number]) [name], [name] VMarland Kitchen ([id number]) -------------------------------------------------------------------------------- Problem List Details Patient Name: [name] Date of Service: 09/12/2016 12:45 PM Medical Record Number: [id number] Patient Account Number: [id number] Date of Birth/Sex: May 23, 1946 (70 y.o. Male) Treating RN: [name] Primary Care Physician: Oretha Milch Other Clinician: Referring Physician: Oretha Milch Treating Physician/Extender: Rudene Re in Treatment: 25 Active Problems ICD-10 Encounter Code Description Active Date Diagnosis E11.621 Type 2 diabetes mellitus with foot ulcer 01/01/2016 Yes L89.613 Pressure ulcer of right heel, stage 3 01/01/2016 Yes E66.01 Morbid (severe) obesity due to excess calories 01/01/2016 Yes I89.0 Lymphedema, not elsewhere classified 01/01/2016 Yes M70.871 Other soft tissue disorders related to use, overuse and 01/19/2016 Yes pressure, right ankle and foot I70.234 Atherosclerosis of native arteries of right leg  with 02/16/2016 Yes ulceration of heel and midfoot Inactive Problems Resolved Problems ICD-10 Code Description Active Date Resolved Date L89.322 Pressure ulcer of left buttock, stage 2 01/01/2016 01/01/2016 Electronic Signature(s) Signed: 09/12/2016 1:33:30 PM By: Evlyn Kanner MD, FACS Entered By: Evlyn Kanner on 09/12/2016 13:33:30 [name] ([id number]) [name], [name] VMarland Kitchen ([id number]) -------------------------------------------------------------------------------- Progress Note Details Patient Name: [name] Date of Service: 09/12/2016 12:45 PM Medical Record Number: [id number] Patient Account Number: [id number] Date of Birth/Sex: [birth year] (70 y.o. Male) Treating RN: [name] Primary Care Physician: Oretha Milch Other Clinician: Referring Physician: Oretha Milch Treating Physician/Extender: Rudene Re in Treatment: 68 Subjective Chief Complaint Information obtained from Patient Patient is at the clinic for treatment of an open pressure ulcer the left upper thigh and gluteal region and the right heel with bilateral swelling of the legs all of it which is going on for over a year History of Present Illness (HPI) The following HPI elements were documented for the patient's wound: Location: ulcerated area on the right heel, left gluteal region and thigh and then bilateral lower extremities Quality: Patient reports experiencing a sharp pain to affected area(s). Severity: Patient states wound are getting worse. Duration: Patient has had the wound for > 12 months prior to seeking treatment at the wound center Timing: Pain in wound is constant (hurts all the time) Context: The wound appeared gradually over time Modifying Factors: Other treatment(s) tried include: he sees his heart doctor and his primary care doctor Associated Signs and Symptoms: patient has not been able to walk for over a year now 70 year old gentleman with a known history of hypertension,  diabetes, obstructive sleep apnea, COPD, diastolic CHF, coronary artery disease was admitted to the hospital with sepsis from an ulcer of the right heel and was treated there in October 2016. he also has chronic bilateral lower extremity edema and lymphedema. He had received vancomycin, Zosyn and at that stage and x-rays showed hardware in the right ankle but no evidence of osteomyelitis. he was a former smoker. he was also treated with Augmentin orally for 10 days. He is either bedbound or wheelchair-bound and does not ambulate by himself. 01/19/2016 -- he has not been seen here for 3 weeks and this was because he was admitted to [facility] between 223 and 01/08/2016 for sepsis, UTI  and pneumonia involving the left lung. he was treated with IV vancomycin and Zosyn and then meropenem. Was discharged home on oral Bactrim for 2 weeks none of his vascular test or x-rays were done and we will reorder these. 01/26/2016 -- he has not yet done the x-ray of his foot and is vascular tests are still pending. I have asked him to work on these with his nursing home staff. Addendum: he has got an x-ray of the right foot done which shows that his osteopenia but no specific ostial lysis or abnormal periosteal reaction. Final impression was degenerative changes with dorsal foot soft tissue swelling. 02/16/2016 -- lower extremity arterial duplex examination shows a 50-99% stenosis of the right tibioperoneal trunk. He had biphasic flow in the right SFA, popliteal and tibioperoneal trunk. Left-sided he had triphasic flow throughout [name], [name] ([id number]) 02/29/2016 -- he is awaiting his vascular opinion with Dr. Gilda CreaseSchnier which is to be done on April 24 03/08/2016 -- he has not kept his appointment with Dr. Gilda CreaseSchnier on April 24 and does not seem to know what happened about this. it's difficult to gauge whether he is in full control of his mental faculties as at times he is extremely  rude to the nursing staff. 03/22/2016 -- the patient was seen by Dr. 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 Recommended that the patient had severe atherosclerotic changes of both lower extremities associated with ulceration and tissue loss of the foot. This is a limb threatening ischemia and the patient was recommended to undergo angiography of the lower extremity with a hope for intervention for limb salvage. Patient agreed and will proceed to angiography. He was admitted to the hospital between May 2 and 03/15/2016 - he underwent induction of a catheter into his right lower extremity and third order catheter placement with contrast injection to the right lower extremity for distal runoff. Percutaneous transluminal angioplasty of the right superficial femoral and popliteal arteries were done. He also had a right peroneal angioplasty. Patient had a postoperative hematoma and was admitted for observation and in the next 24 hours he was very agitated and combative and had to be seen by psychiatric. He had a follow-up with Dr. Gilda CreaseSchnier in 3 weeks. 04/18/2016 -- notes reviewed from the vascular office where he was seen for his postop visit after the PTA of the right SFA and popliteal arteries on 03/12/2016. He underwent an ABI which showed right ABI to be more than 1.3 and left to be more than 1 more than 1.3, great toe and PPG waveforms are decreased bilaterally. No additional intervention indicated at this time and the patient was to follow-up in 3 months with an ABI and bilateral lower extremity duplex study. 05/02/2016 -- he complains that his compression stockings are causing him a lot of discomfort and he is not happy wearing them. 05/30/2016 -- the swelling of both legs has increased again and he has had blisters which have opened out into ulcerations. 09/12/2016 -- the  lymphedema pumps are still not delivered and hopefully will be done within this week. As far as his skin substitute goes we are still awaiting a response from his insurance Objective Constitutional Pulse regular. Respirations normal and unlabored. Afebrile. Vitals Time Taken: 1:02 PM, Height: 70 in, Weight: 235 lbs, BMI: 33.7, Temperature: 98.0 F, Pulse: 62 bpm, Respiratory Rate: 18 breaths/min, Blood Pressure: 128/62 mmHg. Eyes Nonicteric. Reactive  to light. Ears, Nose, Mouth, and Throat Heist, Raybon V. ([id number]) Lips, teeth, and gums WNL.Marland Kitchen. Moist mucosa without lesions. Neck supple and nontender. No palpable supraclavicular or cervical adenopathy. Normal sized without goiter. Respiratory WNL. No retractions.. Cardiovascular Pedal Pulses WNL. No clubbing, cyanosis or edema. Lymphatic No adneopathy. No adenopathy. No adenopathy. Musculoskeletal Adexa without tenderness or enlargement.. Digits and nails w/o clubbing, cyanosis, infection, petechiae, ischemia, or inflammatory conditions.Marland Kitchen. Psychiatric Judgement and insight Intact.. No evidence of depression, anxiety, or agitation.. General Notes: Lymphedema both lower extremities has increased significantly and he has also got pedal edema today. the right heel and calcaneal ulcer has some subcutaneous debris and sharply remove this with a #3 curet. Integumentary (Hair, Skin) No suspicious lesions. No crepitus or fluctuance. No peri-wound warmth or erythema. No masses.. Wound #10 status is Open. Original cause of wound was Blister. The wound is located on the Right,Anterior Lower Leg. The wound measures 0.1cm length x 0.1cm width x 0.1cm depth; 0.008cm^2 area and 0.001cm^3 volume. The wound is limited to skin breakdown. There is a large amount of serous drainage noted. The wound margin is distinct with the outline attached to the wound base. There is large (67-100%) red granulation within the wound bed. There is no necrotic tissue  within the wound bed. The periwound skin appearance exhibited: Localized Edema, Moist, Erythema. The surrounding wound skin color is noted with erythema which is circumferential. Periwound temperature was noted as No Abnormality. The periwound has tenderness on palpation. Wound #3 status is Open. Original cause of wound was Pressure Injury. The wound is located on the Right Calcaneus. The wound measures 1cm length x 2.3cm width x 0.2cm depth; 1.806cm^2 area and 0.361cm^3 volume. The wound is limited to skin breakdown. There is no tunneling or undermining noted. There is a large amount of serous drainage noted. The wound margin is flat and intact. There is medium (34-66%) red, pink granulation within the wound bed. There is a medium (34-66%) amount of necrotic tissue within the wound bed including Adherent Slough. The periwound skin appearance exhibited: Localized Edema, Moist, Erythema. The surrounding wound skin color is noted with erythema which is circumferential. Periwound temperature was noted as No Abnormality. The periwound has tenderness on palpation. Wound #9 status is Open. Original cause of wound was Gradually Appeared. The wound is located on the Left,Anterior Lower Leg. The wound measures 8.5cm length x 8cm width x 0.1cm depth; 53.407cm^2 area and 5.341cm^3 volume. The wound is limited to skin breakdown. There is no tunneling or undermining [name], [name] ([id number]) noted. There is a large amount of serous drainage noted. The wound margin is flat and intact. There is large (67-100%) red, friable granulation within the wound bed. There is a small (1-33%) amount of necrotic tissue within the wound bed including Adherent Slough. The periwound skin appearance exhibited: Induration, Moist, Hemosiderin Staining. The periwound skin appearance did not exhibit: Callus, Crepitus, Excoriation, Fluctuance, Friable, Localized Edema, Rash, Scarring, Dry/Scaly, Maceration, Atrophie Blanche,  Cyanosis, Ecchymosis, Mottled, Pallor, Rubor, Erythema. 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 There  was a Skin/Subcutaneous Tissue Debridement ([id number]) debridement with total area of 2.3 sq cm performed by Evlyn Kanner, MD. with the following instrument(s): Curette to remove Viable and Non-Viable tissue/material including Exudate, Fat, Fibrin/Slough, and Subcutaneous after achieving pain control using Lidocaine 4% Topical Solution. A time out was conducted at 13:21, prior to the start of the procedure. A Minimum amount of bleeding was controlled with Pressure. The procedure was tolerated well with a pain level of 0 throughout and a pain level of 0 following the procedure. Post Debridement Measurements: 1cm length x 2.3cm width x 0.2cm depth; 0.361cm^3 volume. Post debridement Stage noted as Category/Stage III. Character of Wound/Ulcer Post Debridement requires further debridement. Severity of Tissue Post Debridement is: Fat layer exposed. Post procedure Diagnosis Wound #3: Same as Pre-Procedure Wound #10 Wound #10 is a Lymphedema located on the Right,Anterior Lower Leg . There was a Three Layer Compression Therapy Procedure by [name], RN. Post procedure Diagnosis Wound #10: Same as Pre-Procedure Wound #9 [name], [name] 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 There was a Three Layer Compression Therapy Procedure by [name], RN. Post  procedure Diagnosis Wound #9: Same as Pre-Procedure Plan Wound Cleansing: Wound #10 Right,Anterior Lower Leg: No tub bath. - only sink bath or bed bath Wound #3 Right Calcaneus: No tub bath. - only sink bath or bed bath Wound #9 Left,Anterior Lower Leg: No tub bath. - only sink bath or bed bath Anesthetic: Wound #10 Right,Anterior Lower Leg: Topical Lidocaine 4% cream applied to wound bed prior to debridement - for clinic use only Wound #3 Right Calcaneus: Topical Lidocaine 4% cream applied to wound bed prior to debridement - for clinic use only Wound #9 Left,Anterior Lower Leg: Topical Lidocaine 4% cream applied to wound bed prior to debridement - for clinic use only Skin Barriers/Peri-Wound Care: Wound #10 Right,Anterior Lower Leg: Barrier cream - around the heel wound to reddened and irritated areas Wound #3 Right Calcaneus: Barrier cream - around the heel wound to reddened and irritated areas Wound #9 Left,Anterior Lower Leg: Barrier cream - around the heel wound to reddened and irritated areas Primary Wound Dressing: Wound #10 Right,Anterior Lower Leg: Sorbalgon Ag Wound #3 Right Calcaneus: Hydrafera Blue Wound #9 Left,Anterior Lower Leg: Sorbalgon Ag Secondary Dressing: Wound #3 Right Calcaneus: Dry Gauze - Heel cups Dressing Change Frequency: Wound #10 Right,Anterior Lower Leg: Change dressing every week Wound #3 Right Calcaneus: Change dressing every other day. - unless saturated or soiled Wound #9 Left,Anterior Lower Leg: Change dressing every week Follow-up Appointments: [name], [name] ([id number]) Wound #3 Right Calcaneus: Return Appointment in 1 week. Edema Control: 3 Layer Compression System - Bilateral Other: - unna to anchor Off-Loading: Wound #3 Right Calcaneus: Turn and reposition every 2 hours Other: - sage boots at night and float heel when lying in bed Additional Orders / Instructions: Wound #3 Right Calcaneus: Increase protein  intake. Medications-please add to medication list.: Wound #3 Right Calcaneus: Other: - Vitamin C, Vitamin A, Zinc, multivitamin I have recommended using Hydrofera Blue for his heel to be changed every other day. We will anxiously await his lymphedema pumps which hopefully will make a difference to his overall general wound healing. In the meanwhile we will continue with silver alginate and a 3 layer Profore. Electronic Signature(s) Signed: 09/12/2016 4:15:46 PM By: Evlyn Kanner MD, FACS Previous Signature: 09/12/2016 1:37:26 PM Version By: Evlyn Kanner MD, FACS Entered By: Evlyn Kanner on  09/12/2016 16:15:46 [name], [name] ([id number]) -------------------------------------------------------------------------------- SuperBill Details Patient Name: [name] Date of Service: 09/12/2016 Medical Record Number: [id number] Patient Account Number: [id number] Date of Birth/Sex: [birth year] (70 y.o. 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 Facility Procedures CPT4: Description Modifier Quantity Code 78295621 11042 - DEB SUBQ TISSUE 20 SQ CM/< 1 ICD-10 Description Diagnosis E11.621 Type 2 diabetes mellitus with foot ulcer L89.613 Pressure ulcer of right heel, stage 3 I89.0 Lymphedema, not elsewhere classified  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

## 2016-09-19 ENCOUNTER — Encounter: Payer: Medicare Other | Admitting: Surgery

## 2016-09-19 DIAGNOSIS — E11621 Type 2 diabetes mellitus with foot ulcer: Secondary | ICD-10-CM | POA: Diagnosis not present

## 2016-09-20 NOTE — Progress Notes (Signed)
[name], Valon V. ([id number]) Visit Report for 09/19/2016 Arrival Information Details Patient Name: [name], Emanuele V. Date of Service: 09/19/2016 3:15 PM Medical Record Number: [id number] Patient Account Number: [id number] Date of Birth/Sex: 1946-02-21 85(70 y.o. Male) Treating RN: Huel CoventryWoody, Kim Primary Care Physician: Oretha MilchSMITH, [name] Other Clinician: Referring Physician: Oretha MilchSMITH, [name] Treating Physician/Extender: Rudene ReBritto, Errol Weeks in Treatment: 37 Visit Information History Since Last Visit Added or deleted any medications: No Patient Arrived: Wheel Chair Any new allergies or adverse reactions: No Arrival Time: 15:30 Had a fall or experienced change in No activities of daily living that may affect Accompanied By: self risk of falls: Transfer Assistance: Manual Signs or symptoms of abuse/neglect since last No Patient Identification Verified: Yes visito Secondary Verification Process Yes Hospitalized since last visit: No Completed: Has Dressing in Place as Prescribed: Yes Patient Requires Transmission-Based No Pain Present Now: No Precautions: Patient Has Alerts: Yes Patient Alerts: DM II Electronic Signature(s) Signed: 09/19/2016 5:39:47 PM By: Elliot GurneyWoody, RN, [name], Kim RN, [name] Entered By: Elliot GurneyWoody, RN, [name], Kim on 09/19/2016 15:30:47 [name], Carlyn V. ([id number]) -------------------------------------------------------------------------------- Encounter Discharge Information Details Patient Name: [name], Ismaeel V. Date of Service: 09/19/2016 3:15 PM Medical Record Number: [id number] Patient Account Number: [id number] Date of Birth/Sex: 1946-02-21 11(70 y.o. Male) Treating RN: Huel CoventryWoody, Kim Primary Care Physician: Oretha MilchSMITH, [name] Other Clinician: Referring Physician: Oretha MilchSMITH, [name] Treating Physician/Extender: Rudene ReBritto, Errol Weeks in Treatment: 37 Encounter Discharge Information Items Schedule Follow-up Appointment: No Medication Reconciliation completed No and provided to Patient/Care  Josearmando Kuhnert: Provided on Clinical Summary of Care: 09/19/2016 Form Type Recipient Paper Patient EL Electronic Signature(s) Signed: 09/19/2016 4:19:18 PM By: Gwenlyn PerkingMoore, Shelia Entered By: Gwenlyn PerkingMoore, Shelia on 09/19/2016 16:19:18 Kydd, Daulton Seth BakeV. ([id number]) -------------------------------------------------------------------------------- Lower Extremity Assessment Details Patient Name: [name], Dawsen V. Date of Service: 09/19/2016 3:15 PM Medical Record Number: [id number] Patient Account Number: [id number] Date of Birth/Sex: 1946-02-21 [age] y.o. Male) Treating RN: Huel CoventryWoody, Kim Primary Care Physician: Oretha MilchSMITH, [name] Other Clinician: Referring Physician: Oretha MilchSMITH, [name] Treating Physician/Extender: Rudene ReBritto, Errol Weeks in Treatment: 37 Edema Assessment Assessed: [Left: No] [Right: No] E[Left: dema] [Right: :] Calf Left: Right: Point of Measurement: 34 cm From Medial Instep 38.5 cm 35.5 cm Ankle Left: Right: Point of Measurement: 12 cm From Medial Instep 26 cm 24 cm Vascular Assessment Claudication: Claudication Assessment [Left:None] [Right:None] Pulses: Posterior Tibial Dorsalis Pedis Palpable: [Left:Yes] [Right:Yes] Extremity colors, hair growth, and conditions: Extremity Color: [Left:Hyperpigmented] [Right:Hyperpigmented] Hair Growth on Extremity: [Left:No] [Right:No] Temperature of Extremity: [Left:Warm] [Right:Warm] Capillary Refill: [Left:< 3 seconds] [Right:< 3 seconds] Dependent Rubor: [Left:No] [Right:No] Blanched when Elevated: [Left:No] [Right:No] 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 ([id number]) Electronic Signature(s) Signed: 09/19/2016 5:39:47 PM By: Elliot GurneyWoody, RN, [name], Kim RN, [name] Entered By: Elliot GurneyWoody, RN, [name], Kim on 09/19/2016 15:40:19 [name], Izen V.  ([id number]) -------------------------------------------------------------------------------- Multi Wound Chart Details Patient Name: [name], Regan V. Date of Service: 09/19/2016 3:15 PM Medical Record Number: [id number] Patient Account Number: [id number] Date of Birth/Sex: 1946-02-21 96(70 y.o. Male) Treating RN: Huel CoventryWoody, Kim Primary Care Physician: Oretha MilchSMITH, [name] Other Clinician: Referring Physician: Oretha MilchSMITH, [name] Treating Physician/Extender: Rudene ReBritto, Errol Weeks in Treatment: 37 Vital Signs Height(in): 70 Pulse(bpm): 68 Weight(lbs): 235 Blood Pressure 128/68 (mmHg): Body Mass Index(BMI): 34 Temperature(F): 97.7 Respiratory Rate 16 (breaths/min): Photos: [10:No Photos] [3:No Photos] [9:No Photos] Wound Location: [10:Right, Anterior Lower Leg Right Calcaneus] [9:Left, Anterior Lower Leg] Wounding Event: [10:Blister] [3:Pressure Injury] [9:Gradually Appeared] Primary Etiology: [10:Lymphedema] [3:Pressure Ulcer] [9:Diabetic Wound/Ulcer of  the Lower Extremity] Secondary Etiology: [10:Diabetic Wound/Ulcer of the Lower Extremity] [3:N/A] [9:Venous Leg Ulcer] Date Acquired: [10:08/22/2016] [3:10/31/2015] [9:08/09/2016] Weeks of Treatment: [10:4] [3:37] [9:5] Wound Status: [10:Open] [3:Open] [9:Open] Clustered Wound: [10:No] [3:No] [9:Yes] Measurements L x W x D 0x0x0 [3:0.8x1.8x0.2] [9:8x6x0.1] (cm) Area (cm) : [10:0] [3:1.131] [9:37.699] Volume (cm) : [10:0] [3:0.226] [9:3.77] % Reduction in Area: [10:100.00%] [3:84.00%] [9:62.40%] % Reduction in Volume: 100.00% [3:84.00%] [9:62.40%] Classification: [10:Partial Thickness] [3:Category/Stage III] [9:Grade 2] Periwound Skin Texture: No Abnormalities Noted [3:No Abnormalities Noted] [9:No Abnormalities Noted] Periwound Skin [10:No Abnormalities Noted] [3:No Abnormalities Noted] [9:No Abnormalities Noted] Moisture: Periwound Skin Color: No Abnormalities Noted [3:No Abnormalities Noted] [9:No Abnormalities Noted] Tenderness on [10:No]  [3:No] [9:No] Treatment Notes Electronic Signature(s) Signed: 09/19/2016 5:39:47 PM By: Elliot Gurney, RN, [name], Kim RN, [name] [place], [place] VMarland Kitchen ([id number]) Entered By: Elliot Gurney, RN, [name], Kim on 09/19/2016 15:59:48 [name] Otto ([id number]) -------------------------------------------------------------------------------- Multi-Disciplinary Care Plan Details Patient Name: [name] Otto. Date of Service: 09/19/2016 3:15 PM Medical Record Number: [id number] Patient Account Number: [id number] Date of Birth/Sex: 1946-05-09 (70 y.o. Male) Treating RN: Huel Coventry Primary Care Physician: Oretha Milch Other Clinician: Referring Physician: Oretha Milch Treating Physician/Extender: Rudene Re in Treatment: 13 Active Inactive Abuse / Safety / Falls / Self Care Management Nursing Diagnoses: Potential for falls Goals: Patient will remain injury free Date Initiated: 03/01/2016 Goal Status: Active Interventions: Assess fall risk on admission and as needed Notes: Nutrition Nursing Diagnoses: Imbalanced nutrition Goals: Patient/caregiver agrees to and verbalizes understanding of need to use nutritional supplements and/or vitamins as prescribed Date Initiated: 03/01/2016 Goal Status: Active Interventions: Assess patient nutrition upon admission and as needed per policy Notes: Orientation to the Wound Care Program Nursing Diagnoses: Knowledge deficit related to the wound healing center program Goals: Patient/caregiver will verbalize understanding of the W[facility]aling Center [address] SHAHEEN, MENDE ([id number]) Date Initiated: 03/01/2016 Goal Status: Active Interventions: Provide education on orientation to the wound center Notes: Pain, Acute or Chronic Nursing Diagnoses: Pain, acute or chronic: actual or potential Potential alteration in comfort, pain Goals: Patient will verbalize adequate pain control and receive pain control interventions during procedures as needed Date Initiated:  03/01/2016 Goal Status: Active Patient/caregiver will verbalize adequate pain control between visits Date Initiated: 03/01/2016 Goal Status: Active Interventions: Assess comfort goal upon admission Complete pain assessment as per visit requirements Notes: Pressure Nursing Diagnoses: Knowledge deficit related to causes and risk factors for pressure ulcer development Knowledge deficit related to management of pressures ulcers Goals: Patient/caregiver will verbalize risk factors for pressure ulcer development Date Initiated: 03/01/2016 Goal Status: Active Interventions: Assess offloading mechanisms upon admission and as needed Notes: Wound/Skin Impairment Nursing Diagnoses: BRYLEY, KOVACEVIC ([id number]) Impaired tissue integrity Goals: Ulcer/skin breakdown will have a volume reduction of 30% by week 4 Date Initiated: 03/01/2016 Goal Status: Active Ulcer/skin breakdown will have a volume reduction of 50% by week 8 Date Initiated: 03/01/2016 Goal Status: Active Ulcer/skin breakdown will have a volume reduction of 80% by week 12 Date Initiated: 03/01/2016 Goal Status: Active Interventions: Assess ulceration(s) every visit Notes: Electronic Signature(s) Signed: 09/19/2016 5:39:47 PM By: Elliot Gurney, RN, [name], Kim RN, [name] Entered By: Elliot Gurney, RN, [name], Kim on 09/19/2016 15:59:41 [name], Foster Seth Bake ([id number]) -------------------------------------------------------------------------------- Pain Assessment Details Patient Name: [name] Otto. Date of Service: 09/19/2016 3:15 PM Medical Record Number: [id number] Patient Account Number: [id number] Date of Birth/Sex: [birth year] (70 y.o. Male) Treating RN: Huel Coventry Primary Care Physician: Oretha Milch Other Clinician: Referring Physician: Oretha Milch Treating Physician/Extender: Rudene Re in  Treatment: 37 Active Problems Location of Pain Severity and Description of Pain Patient Has Paino No Site Locations With Dressing Change:  No Pain Management and Medication Current Pain Management: Electronic Signature(s) Signed: 09/19/2016 5:39:47 PM By: Elliot Gurney, RN, [name], Kim RN, [name] Entered By: Elliot Gurney, RN, [name], Kim on 09/19/2016 15:30:55 [name] Otto ([id number]) -------------------------------------------------------------------------------- Wound Assessment Details Patient Name: [name] Otto. Date of Service: 09/19/2016 3:15 PM Medical Record Number: [id number] Patient Account Number: [id number] Date of Birth/Sex: Apr 08, 1946 (69 y.o. Male) Treating RN: Huel Coventry Primary Care Physician: Oretha Milch Other Clinician: Referring Physician: Oretha Milch Treating Physician/Extender: Rudene Re in Treatment: 37 Wound Status Wound Number: 10 Primary Etiology: Lymphedema Wound Location: Right, Anterior Lower Leg Secondary Diabetic Wound/Ulcer of the Lower Etiology: Extremity Wounding Event: Blister Wound Status: Open Date Acquired: 08/22/2016 Weeks Of Treatment: 4 Clustered Wound: No Photos Photo Uploaded By: Elliot Gurney, RN, [name], Kim on 09/19/2016 17:16:11 Wound Measurements Length: (cm) 0 % Reduction Width: (cm) 0 % Reduction Depth: (cm) 0 Area: (cm) 0 Volume: (cm) 0 in Area: 100% in Volume: 100% Wound Description Classification: Partial Thickness Periwound Skin Texture Texture Color No Abnormalities Noted: No No Abnormalities Noted: No Moisture No Abnormalities Noted: No Electronic Signature(s) Signed: 09/19/2016 5:39:47 PM By: Elliot Gurney, RN, [name], Kim RN, [name] Entered By: Elliot Gurney, RN, [name], Kim on 09/19/2016 15:43:05 [name] Otto ([id number]) -------------------------------------------------------------------------------- Wound Assessment Details Patient Name: [name] Otto. Date of Service: 09/19/2016 3:15 PM Medical Record Number: [id number] Patient Account Number: [id number] Date of Birth/Sex: Dec 12, 1945 (70 y.o. Male) Treating RN: Huel Coventry Primary Care Physician: Oretha Milch Other  Clinician: Referring Physician: Oretha Milch Treating Physician/Extender: Rudene Re in Treatment: 37 Wound Status Wound Number: 3 Primary Etiology: Pressure Ulcer Wound Location: Right Calcaneus Wound Status: Open Wounding Event: Pressure Injury Date Acquired: 10/31/2015 Weeks Of Treatment: 37 Clustered Wound: No Photos Photo Uploaded By: Elliot Gurney, RN, [name], Kim on 09/19/2016 17:16:12 Wound Measurements Length: (cm) 0.8 Width: (cm) 1.8 Depth: (cm) 0.2 Area: (cm) 1.131 Volume: (cm) 0.226 % Reduction in Area: 84% % Reduction in Volume: 84% Wound Description Classification: Category/Stage III Periwound Skin Texture Texture Color No Abnormalities Noted: No No Abnormalities Noted: No Moisture No Abnormalities Noted: No Electronic Signature(s) Signed: 09/19/2016 5:39:47 PM By: Elliot Gurney, RN, [name], Kim RN, [name] Entered By: Elliot Gurney, RN, [name], Kim on 09/19/2016 15:43:05 [name] Otto ([id number]) -------------------------------------------------------------------------------- Wound Assessment Details Patient Name: [name] Otto. Date of Service: 09/19/2016 3:15 PM Medical Record Number: [id number] Patient Account Number: [id number] Date of Birth/Sex: 06-09-1946 (70 y.o. Male) Treating RN: Huel Coventry Primary Care Physician: Oretha Milch Other Clinician: Referring Physician: Oretha Milch Treating Physician/Extender: Rudene Re in Treatment: 37 Wound Status Wound Number: 9 Primary Etiology: Diabetic Wound/Ulcer of the Lower Extremity Wound Location: Left, Anterior Lower Leg Secondary Venous Leg Ulcer Wounding Event: Gradually Appeared Etiology: Date Acquired: 08/09/2016 Wound Status: Open Weeks Of Treatment: 5 Clustered Wound: Yes Photos Photo Uploaded By: Elliot Gurney, RN, [name], Kim on 09/19/2016 17:17:06 Wound Measurements Length: (cm) 8 Width: (cm) 6 Depth: (cm) 0.1 Area: (cm) 37.699 Volume: (cm) 3.77 % Reduction in Area: 62.4% % Reduction in Volume:  62.4% Wound Description Classification: Grade 2 Periwound Skin Texture Texture Color No Abnormalities Noted: No No Abnormalities Noted: No Moisture No Abnormalities Noted: No Electronic Signature(s) Signed: 09/19/2016 5:39:47 PM By: Elliot Gurney, RN, [name], Kim RN, [name] Entered By: Elliot Gurney, RN, [name], Kim on 09/19/2016 15:43:06 [name] Otto ([id number]) -------------------------------------------------------------------------------- Vitals Details Patient Name: [name] Otto Date of Service: 09/19/2016 3:15  PM Medical Record Number: [id number] Patient Account Number: [id number] Date of Birth/Sex: [birth year] [age] y.o. Male) Treating RN: Huel CoventryWoody, Kim Primary Care Physician: Oretha MilchSMITH, [name] Other Clinician: Referring Physician: Oretha MilchSMITH, [name] Treating Physician/Extender: Rudene ReBritto, Errol Weeks in Treatment: 37 Vital Signs Time Taken: 15:30 Temperature (F): 97.7 Height (in): 70 Pulse (bpm): 68 Weight (lbs): 235 Respiratory Rate (breaths/min): 16 Body Mass Index (BMI): 33.7 Blood Pressure (mmHg): 128/68 Reference Range: 80 - 120 mg / dl Electronic Signature(s) Signed: 09/19/2016 5:39:47 PM By: Elliot GurneyWoody, RN, [name], Kim RN, [name] Entered By: Elliot GurneyWoody, RN, [name], Kim on 09/19/2016 15:32:01

## 2016-09-20 NOTE — Progress Notes (Signed)
EDRIAN, MELUCCI ([id number]) Visit Report for 09/19/2016 Chief Complaint Document Details Patient Name: [name], [name] Date of Service: 09/19/2016 3:15 PM Medical Record Number: [id number] Patient Account Number: [id number] Date of Birth/Sex: 1946/09/10 (70 y.o. Male) Treating RN: Huel Coventry Primary Care Physician: Oretha Milch Other Clinician: Referring Physician: Oretha Milch Treating Physician/Extender: Rudene Re in Treatment: 63 Information Obtained from: Patient Chief Complaint Patient is at the clinic for treatment of an open pressure ulcer the left upper thigh and gluteal region and the right heel with bilateral swelling of the legs all of it which is going on for over a year Electronic Signature(s) Signed: 09/19/2016 4:05:47 PM By: Evlyn Kanner MD, FACS Entered By: Evlyn Kanner on 09/19/2016 16:05:47 [name] ([id number]) -------------------------------------------------------------------------------- HPI Details Patient Name: [name] Date of Service: 09/19/2016 3:15 PM Medical Record Number: [id number] Patient Account Number: [id number] Date of Birth/Sex: [birth year] (70 y.o. Male) Treating RN: Huel Coventry Primary Care Physician: Oretha Milch Other Clinician: Referring Physician: Oretha Milch Treating Physician/Extender: Rudene Re in Treatment: 37 History of Present Illness Location: ulcerated area on the right heel, left gluteal region and thigh and then bilateral lower extremities Quality: Patient reports experiencing a sharp pain to affected area(s). Severity: Patient states wound are getting worse. Duration: Patient has had the wound for > 12 months prior to seeking treatment at the wound center Timing: Pain in wound is constant (hurts all the time) Context: The wound appeared gradually over time Modifying Factors: Other treatment(s) tried include: he sees his heart doctor and his primary care doctor Associated Signs and Symptoms:  patient has not been able to walk for over a year now HPI Description: 70 year old gentleman with a known history of hypertension, diabetes, obstructive sleep apnea, COPD, diastolic CHF, coronary artery disease was admitted to the hospital with sepsis from an ulcer of the right heel and was treated there in October 2016. he also has chronic bilateral lower extremity edema and lymphedema. He had received vancomycin, Zosyn and at that stage and x-rays showed hardware in the right ankle but no evidence of osteomyelitis. he was a former smoker. he was also treated with Augmentin orally for 10 days. He is either bedbound or wheelchair-bound and does not ambulate by himself. 01/19/2016 -- he has not been seen here for 3 weeks and this was because he was admitted to [facility] between 223 and 01/08/2016 for sepsis, UTI and pneumonia involving the left lung. he was treated with IV vancomycin and Zosyn and then meropenem. Was discharged home on oral Bactrim for 2 weeks none of his vascular test or x-rays were done and we will reorder these. 01/26/2016 -- he has not yet done the x-ray of his foot and is vascular tests are still pending. I have asked him to work on these with his nursing home staff. Addendum: he has got an x-ray of the right foot done which shows that his osteopenia but no specific ostial lysis or abnormal periosteal reaction. Final impression was degenerative changes with dorsal foot soft tissue swelling. 02/16/2016 -- lower extremity arterial duplex examination shows a 50-99% stenosis of the right tibioperoneal trunk. He had biphasic flow in the right SFA, popliteal and tibioperoneal trunk. Left-sided he had triphasic flow throughout 02/29/2016 -- he is awaiting his vascular opinion with Dr. Gilda Crease which is to be done on April 24 03/08/2016 -- he has not kept his appointment with Dr. Gilda Crease on April 24 and does not seem to know what happened  about this. it's  difficult to gauge whether he is in full control of his mental faculties as at times he is extremely rude to the nursing staff. 03/22/2016 -- the patient was seen by Dr. 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 Recommended that the patient had severe atherosclerotic changes of both lower extremities associated with ulceration and tissue loss of the foot. This is a limb threatening ischemia and the patient was recommended to undergo Mundt, Yue V. ([id number]) angiography of the lower extremity with a hope for intervention for limb salvage. Patient agreed and will proceed to angiography. He was admitted to the hospital between May 2 and 03/15/2016 - he underwent induction of a catheter into his right lower extremity and third order catheter placement with contrast injection to the right lower extremity for distal runoff. Percutaneous transluminal angioplasty of the right superficial femoral and popliteal arteries were done. He also had a right peroneal angioplasty. Patient had a postoperative hematoma and was admitted for observation and in the next 24 hours he was very agitated and combative and had to be seen by psychiatric. He had a follow-up with Dr. Gilda Crease in 3 weeks. 04/18/2016 -- notes reviewed from the vascular office where he was seen for his postop visit after the PTA of the right SFA and popliteal arteries on 03/12/2016. He underwent an ABI which showed right ABI to be more than 1.3 and left to be more than 1 more than 1.3, great toe and PPG waveforms are decreased bilaterally. No additional intervention indicated at this time and the patient was to follow-up in 3 months with an ABI and bilateral lower extremity duplex study. 05/02/2016 -- he complains that his compression stockings are causing him a lot of discomfort and he is not happy wearing them. 05/30/2016 --  the swelling of both legs has increased again and he has had blisters which have opened out into ulcerations. 09/12/2016 -- the lymphedema pumps are still not delivered and hopefully will be done within this week. As far as his skin substitute goes we are still awaiting a response from his Designer, industrial/product) Signed: 09/19/2016 4:05:56 PM By: Evlyn Kanner MD, FACS Entered By: Evlyn Kanner on 09/19/2016 16:05:56 [name] ([id number]) -------------------------------------------------------------------------------- Physical Exam Details Patient Name: [name] Date of Service: 09/19/2016 3:15 PM Medical Record Number: [id number] Patient Account Number: [id number] Date of Birth/Sex: 1946-06-04 (70 y.o. Male) Treating RN: Huel Coventry Primary Care Physician: Oretha Milch Other Clinician: Referring Physician: Oretha Milch Treating Physician/Extender: Rudene Re in Treatment: 37 Constitutional . Pulse regular. Respirations normal and unlabored. Afebrile. . Eyes Nonicteric. Reactive to light. Ears, Nose, Mouth, and Throat Lips, teeth, and gums WNL.Marland Kitchen Moist mucosa without lesions. Neck supple and nontender. No palpable supraclavicular or cervical adenopathy. Normal sized without goiter. Respiratory WNL. No retractions.. Breath sounds WNL, No rubs, rales, rhonchi, or wheeze.. Cardiovascular Heart rhythm and rate regular, no murmur or gallop.. Pedal Pulses WNL. No clubbing, cyanosis or edema. Lymphatic No adneopathy. No adenopathy. No adenopathy. Musculoskeletal Adexa without tenderness or enlargement.. Digits and nails w/o clubbing, cyanosis, infection, petechiae, ischemia, or inflammatory conditions.. Integumentary (Hair, Skin) No suspicious lesions. No crepitus or fluctuance. No peri-wound warmth or erythema. No masses.Marland Kitchen Psychiatric Judgement and insight Intact.. No evidence of depression, anxiety, or agitation.. Notes for edema of the right lower  extremity is looking significantly better and there are no open ulcerations  of note except was few microperforations. The left lower extremity continues to have significant lymphedema and open ulcerations. The heel is looking very good and no sharp debridement was required today. Electronic Signature(s) Signed: 09/19/2016 4:06:34 PM By: Evlyn Kanner MD, FACS Entered By: Evlyn Kanner on 09/19/2016 16:06:33 [name] ([id number]) -------------------------------------------------------------------------------- Physician Orders Details Patient Name: [name] Date of Service: 09/19/2016 3:15 PM Medical Record Number: [id number] Patient Account Number: [id number] Date of Birth/Sex: 16-Oct-1946 (70 y.o. Male) Treating RN: Huel Coventry Primary Care Physician: Oretha Milch Other Clinician: Referring Physician: Oretha Milch Treating Physician/Extender: Rudene Re in Treatment: 21 Verbal / Phone Orders: Yes Clinician: Huel Coventry Read Back and Verified: Yes Diagnosis Coding Wound Cleansing Wound #10 Right,Anterior Lower Leg o No tub bath. - only sink bath or bed bath Wound #3 Right Calcaneus o No tub bath. - only sink bath or bed bath Wound #9 Left,Anterior Lower Leg o No tub bath. - only sink bath or bed bath Anesthetic Wound #10 Right,Anterior Lower Leg o Topical Lidocaine 4% cream applied to wound bed prior to debridement - for clinic use only Wound #3 Right Calcaneus o Topical Lidocaine 4% cream applied to wound bed prior to debridement - for clinic use only Wound #9 Left,Anterior Lower Leg o Topical Lidocaine 4% cream applied to wound bed prior to debridement - for clinic use only Skin Barriers/Peri-Wound Care Wound #10 Right,Anterior Lower Leg o Barrier cream - around the heel wound to reddened and irritated areas Wound #3 Right Calcaneus o Barrier cream - around the heel wound to reddened and irritated areas o Barrier cream - around the heel  wound to reddened and irritated areas Wound #9 Left,Anterior Lower Leg o Barrier cream - around the heel wound to reddened and irritated areas o Barrier cream - around the heel wound to reddened and irritated areas Primary Wound Dressing Wound #10 Right,Anterior Lower Leg o Sorbalgon Ag Wound #3 Right Calcaneus Stjulien, Kingstyn V. ([id number]) o Hydrafera Blue Wound #9 Left,Anterior Lower Leg o Sorbalgon Ag Secondary Dressing Wound #3 Right Calcaneus o Dry Gauze - Heel cups Dressing Change Frequency Wound #10 Right,Anterior Lower Leg o Change dressing every week Wound #3 Right Calcaneus o Change dressing every other day. - unless saturated or soiled Wound #9 Left,Anterior Lower Leg o Change dressing every week Follow-up Appointments Wound #3 Right Calcaneus o Return Appointment in 1 week. Edema Control o 3 Layer Compression System - Bilateral o Other: - unna to anchor Off-Loading Wound #3 Right Calcaneus o Turn and reposition every 2 hours o Other: - sage boots at night and float heel when lying in bed Additional Orders / Instructions Wound #3 Right Calcaneus o Increase protein intake. Medications-please add to medication list. Wound #3 Right Calcaneus o Other: - Vitamin C, Vitamin A, Zinc, multivitamin Electronic Signature(s) Signed: 09/19/2016 5:08:19 PM By: Evlyn Kanner MD, FACS Signed: 09/19/2016 5:39:47 PM By: Elliot Gurney RN, [name], Kim RN, [name] Entered By: Elliot Gurney, RN, [name], Kim on 09/19/2016 16:00:50 [name] ([id number]) [name], Laquinn Seth Bake ([id number]) -------------------------------------------------------------------------------- Problem List Details Patient Name: [name] Date of Service: 09/19/2016 3:15 PM Medical Record Number: [id number] Patient Account Number: [id number] Date of Birth/Sex: November 12, 1945 (70 y.o. Male) Treating RN: Huel Coventry Primary Care Physician: Oretha Milch Other Clinician: Referring Physician: Oretha Milch Treating Physician/Extender: Rudene Re in Treatment: 37 Active Problems ICD-10 Encounter Code Description Active Date Diagnosis E11.621 Type 2 diabetes mellitus with foot ulcer 01/01/2016 Yes L89.613 Pressure ulcer of right heel,  stage 3 01/01/2016 Yes E66.01 Morbid (severe) obesity due to excess calories 01/01/2016 Yes I89.0 Lymphedema, not elsewhere classified 01/01/2016 Yes M70.871 Other soft tissue disorders related to use, overuse and 01/19/2016 Yes pressure, right ankle and foot 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/16/2016 Yes ulceration of heel and midfoot Inactive Problems Resolved Problems ICD-10 Code Description Active Date Resolved Date L89.322 Pressure ulcer of left buttock, stage 2 01/01/2016 01/01/2016 Electronic Signature(s) Signed: 09/19/2016 4:05:39 PM By: Evlyn Kanner MD, FACS Entered By: Evlyn Kanner on 09/19/2016 16:05:38 [name] ([id number]) [name], Jeyden VMarland Kitchen ([id number]) -------------------------------------------------------------------------------- Progress Note Details Patient Name: [name] Date of Service: 09/19/2016 3:15 PM Medical Record Number: [id number] Patient Account Number: [id number] Date of Birth/Sex: Sep 10, 1946 (70 y.o. Male) Treating RN: Huel Coventry Primary Care Physician: Oretha Milch Other Clinician: Referring Physician: Oretha Milch Treating Physician/Extender: Rudene Re in Treatment: 81 Subjective Chief Complaint Information obtained from Patient Patient is at the clinic for treatment of an open pressure ulcer the left upper thigh and gluteal region and the right heel with bilateral swelling of the legs all of it which is going on for over a year History of Present Illness (HPI) The following HPI elements were documented for the patient's wound: Location: ulcerated area on the right heel, left gluteal region and thigh and then bilateral lower extremities Quality: Patient  reports experiencing a sharp pain to affected area(s). Severity: Patient states wound are getting worse. Duration: Patient has had the wound for > 12 months prior to seeking treatment at the wound center Timing: Pain in wound is constant (hurts all the time) Context: The wound appeared gradually over time Modifying Factors: Other treatment(s) tried include: he sees his heart doctor and his primary care doctor Associated Signs and Symptoms: patient has not been able to walk for over a year now 70 year old gentleman with a known history of hypertension, diabetes, obstructive sleep apnea, COPD, diastolic CHF, coronary artery disease was admitted to the hospital with sepsis from an ulcer of the right heel and was treated there in October 2016. he also has chronic bilateral lower extremity edema and lymphedema. He had received vancomycin, Zosyn and at that stage and x-rays showed hardware in the right ankle but no evidence of osteomyelitis. he was a former smoker. he was also treated with Augmentin orally for 10 days. He is either bedbound or wheelchair-bound and does not ambulate by himself. 01/19/2016 -- he has not been seen here for 3 weeks and this was because he was admitted to [facility] between 223 and 01/08/2016 for sepsis, UTI and pneumonia involving the left lung. he was treated with IV vancomycin and Zosyn and then meropenem. Was discharged home on oral Bactrim for 2 weeks none of his vascular test or x-rays were done and we will reorder these. 01/26/2016 -- he has not yet done the x-ray of his foot and is vascular tests are still pending. I have asked him to work on these with his nursing home staff. Addendum: he has got an x-ray of the right foot done which shows that his osteopenia but no specific ostial lysis or abnormal periosteal reaction. Final impression was degenerative changes with dorsal foot soft tissue swelling. 02/16/2016 -- lower extremity arterial  duplex examination shows a 50-99% stenosis of the right tibioperoneal trunk. He had biphasic flow in the right SFA, popliteal and tibioperoneal trunk. Left-sided he had triphasic flow throughout YEHYA, BRENDLE ([id number]) 02/29/2016 -- he is awaiting his vascular  opinion with Dr. Gilda Crease which is to be done on April 24 03/08/2016 -- he has not kept his appointment with Dr. Gilda Crease on April 24 and does not seem to know what happened about this. it's difficult to gauge whether he is in full control of his mental faculties as at times he is extremely rude to the nursing staff. 03/22/2016 -- the patient was seen by Dr. 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 Recommended that the patient had severe atherosclerotic changes of both lower extremities associated with ulceration and tissue loss of the foot. This is a limb threatening ischemia and the patient was recommended to undergo angiography of the lower extremity with a hope for intervention for limb salvage. Patient agreed and will proceed to angiography. He was admitted to the hospital between May 2 and 03/15/2016 - he underwent induction of a catheter into his right lower extremity and third order catheter placement with contrast injection to the right lower extremity for distal runoff. Percutaneous transluminal angioplasty of the right superficial femoral and popliteal arteries were done. He also had a right peroneal angioplasty. Patient had a postoperative hematoma and was admitted for observation and in the next 24 hours he was very agitated and combative and had to be seen by psychiatric. He had a follow-up with Dr. Gilda Crease in 3 weeks. 04/18/2016 -- notes reviewed from the vascular office where he was seen for his postop visit after the PTA of the right SFA and popliteal arteries on 03/12/2016. He underwent an ABI which  showed right ABI to be more than 1.3 and left to be more than 1 more than 1.3, great toe and PPG waveforms are decreased bilaterally. No additional intervention indicated at this time and the patient was to follow-up in 3 months with an ABI and bilateral lower extremity duplex study. 05/02/2016 -- he complains that his compression stockings are causing him a lot of discomfort and he is not happy wearing them. 05/30/2016 -- the swelling of both legs has increased again and he has had blisters which have opened out into ulcerations. 09/12/2016 -- the lymphedema pumps are still not delivered and hopefully will be done within this week. As far as his skin substitute goes we are still awaiting a response from his insurance Objective Constitutional Pulse regular. Respirations normal and unlabored. Afebrile. Vitals Time Taken: 3:30 PM, Height: 70 in, Weight: 235 lbs, BMI: 33.7, Temperature: 97.7 F, Pulse: 68 bpm, Respiratory Rate: 16 breaths/min, Blood Pressure: 128/68 mmHg. Eyes Nonicteric. Reactive to light. Ears, Nose, Mouth, and Throat Austad, Kaydin V. ([id number]) Lips, teeth, and gums WNL.Marland Kitchen Moist mucosa without lesions. Neck supple and nontender. No palpable supraclavicular or cervical adenopathy. Normal sized without goiter. Respiratory WNL. No retractions.. Breath sounds WNL, No rubs, rales, rhonchi, or wheeze.. Cardiovascular Heart rhythm and rate regular, no murmur or gallop.. Pedal Pulses WNL. No clubbing, cyanosis or edema. Lymphatic No adneopathy. No adenopathy. No adenopathy. Musculoskeletal Adexa without tenderness or enlargement.. Digits and nails w/o clubbing, cyanosis, infection, petechiae, ischemia, or inflammatory conditions.Marland Kitchen Psychiatric Judgement and insight Intact.. No evidence of depression, anxiety, or agitation.. General Notes: for edema of the right lower extremity is looking significantly better and there are no open ulcerations of note except was few  microperforations. The left lower extremity continues to have significant lymphedema and open ulcerations. The heel is looking very good and no sharp debridement was required today. Integumentary (  Hair, Skin) No suspicious lesions. No crepitus or fluctuance. No peri-wound warmth or erythema. No masses.. Wound #10 status is Open. Original cause of wound was Blister. The wound is located on the Right,Anterior Lower Leg. The wound measures 0cm length x 0cm width x 0cm depth; 0cm^2 area and 0cm^3 volume. Wound #3 status is Open. Original cause of wound was Pressure Injury. The wound is located on the Right Calcaneus. The wound measures 0.8cm length x 1.8cm width x 0.2cm depth; 1.131cm^2 area and 0.226cm^3 volume. Wound #9 status is Open. Original cause of wound was Gradually Appeared. The wound is located on the Left,Anterior Lower Leg. The wound measures 8cm length x 6cm width x 0.1cm depth; 37.699cm^2 area and 3.77cm^3 volume. Assessment Active Problems ICD-10 E11.621 - Type 2 diabetes mellitus with foot ulcer Morriss, Erving V. ([id number]) L89.613 - Pressure ulcer of right heel, stage 3 E66.01 - Morbid (severe) obesity due to excess calories I89.0 - Lymphedema, not elsewhere classified M70.871 - Other soft tissue disorders related to use, overuse and pressure, right ankle and foot I70.234 - Atherosclerosis of native arteries of right leg with ulceration of heel and midfoot Plan Wound Cleansing: Wound #10 Right,Anterior Lower Leg: No tub bath. - only sink bath or bed bath Wound #3 Right Calcaneus: No tub bath. - only sink bath or bed bath Wound #9 Left,Anterior Lower Leg: No tub bath. - only sink bath or bed bath Anesthetic: Wound #10 Right,Anterior Lower Leg: Topical Lidocaine 4% cream applied to wound bed prior to debridement - for clinic use only Wound #3 Right Calcaneus: Topical Lidocaine 4% cream applied to wound bed prior to debridement - for clinic use only Wound #9  Left,Anterior Lower Leg: Topical Lidocaine 4% cream applied to wound bed prior to debridement - for clinic use only Skin Barriers/Peri-Wound Care: Wound #10 Right,Anterior Lower Leg: Barrier cream - around the heel wound to reddened and irritated areas Wound #3 Right Calcaneus: Barrier cream - around the heel wound to reddened and irritated areas Barrier cream - around the heel wound to reddened and irritated areas Wound #9 Left,Anterior Lower Leg: Barrier cream - around the heel wound to reddened and irritated areas Barrier cream - around the heel wound to reddened and irritated areas Primary Wound Dressing: Wound #10 Right,Anterior Lower Leg: Sorbalgon Ag Wound #3 Right Calcaneus: Hydrafera Blue Wound #9 Left,Anterior Lower Leg: Sorbalgon Ag Secondary Dressing: Wound #3 Right Calcaneus: Dry Gauze - Heel cups Dressing Change Frequency: Wound #10 Right,Anterior Lower Leg: Change dressing every week Wound #3 Right Calcaneus: Fredricksen, Kobee V. ([id number]) Change dressing every other day. - unless saturated or soiled Wound #9 Left,Anterior Lower Leg: Change dressing every week Follow-up Appointments: Wound #3 Right Calcaneus: Return Appointment in 1 week. Edema Control: 3 Layer Compression System - Bilateral Other: - unna to anchor Off-Loading: Wound #3 Right Calcaneus: Turn and reposition every 2 hours Other: - sage boots at night and float heel when lying in bed Additional Orders / Instructions: Wound #3 Right Calcaneus: Increase protein intake. Medications-please add to medication list.: Wound #3 Right Calcaneus: Other: - Vitamin C, Vitamin A, Zinc, multivitamin I have recommended using Hydrofera Blue for his heel to be changed every other day. We will anxiously await his lymphedema pumps which hopefully will make a difference to his overall general wound healing. His insurance company has indicated that his coinsurance for a skin substitute will be in the range of  about $500 per visit and the patient In the meanwhile we will continue  with silver alginate and a 3 layer Profore. Electronic Signature(s) Signed: 09/19/2016 4:07:52 PM By: Evlyn KannerBritto, Marco Raper MD, FACS Entered By: Evlyn KannerBritto, Kalandra Masters on 09/19/2016 16:07:51 [name] OttoLINDLEY, Ferdinand V. ([id number]) -------------------------------------------------------------------------------- SuperBill Details Patient Name: [name] OttoLINDLEY, Jaran V. Date of Service: 09/19/2016 Medical Record Number: [id number] Patient Account Number: [id number] Date of Birth/Sex: November 23, 1945 [age] y.o. 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 Physician Procedures CPT4: Description Modifier Quantity Code [id number] 99213 - WC PHYS LEVEL 3 - EST PT 1 ICD-10 Description Diagnosis E11.621 Type 2 diabetes mellitus with foot ulcer L89.613 Pressure ulcer of right heel, stage 3 I89.0 Lymphedema, not elsewhere classified  M70.871 Other soft tissue disorders related to use, overuse and pressure, right ankle and foot Electronic Signature(s) Signed: 09/20/2016 8:40:49 AM By: Evlyn KannerBritto, Flint Hakeem MD, FACS Previous Signature: 09/19/2016 4:08:11 PM Version By: Evlyn KannerBritto, Kyrene Longan MD, FACS Entered By: Elliot GurneyWoody, RN, [name], Kim on 09/19/2016  17:48:44

## 2016-09-26 ENCOUNTER — Encounter: Payer: Medicare Other | Admitting: Surgery

## 2016-09-26 DIAGNOSIS — E11621 Type 2 diabetes mellitus with foot ulcer: Secondary | ICD-10-CM | POA: Diagnosis not present

## 2016-09-27 NOTE — Progress Notes (Signed)
BLANCHARD, WILLHITE ([id number]) Visit Report for 09/26/2016 Arrival Information Details Patient Name: [name], [name] Date of Service: 09/26/2016 2:15 PM Medical Record Number: [id number] Patient Account Number: [id number] Date of Birth/Sex: 1946/04/13 (70 y.o. Male) Treating RN: [name], RN, [name], Rolette Sink Primary Care Physician: Oretha Milch Other Clinician: Referring Physician: Oretha Milch Treating Physician/Extender: Rudene Re in Treatment: 21 Visit Information History Since Last Visit All ordered tests and consults were completed: No Patient Arrived: Wheel Chair Added or deleted any medications: No Arrival Time: 14:15 Any new allergies or adverse reactions: No Accompanied By: self Had a fall or experienced change in No activities of daily living that may affect Transfer Assistance: None risk of falls: Patient Identification Verified: Yes Signs or symptoms of abuse/neglect since last No Secondary Verification Process Yes visito Completed: Hospitalized since last visit: No Patient Requires Transmission-Based No Has Dressing in Place as Prescribed: Yes Precautions: Has Compression in Place as Prescribed: Yes Patient Has Alerts: Yes Pain Present Now: Yes Patient Alerts: DM II Electronic Signature(s) Signed: 09/26/2016 5:06:57 PM By: Elpidio Eric [name], RN Entered By: Elpidio Eric on 09/26/2016 14:17:28 [name] ([id number]) -------------------------------------------------------------------------------- Encounter Discharge Information Details Patient Name: [name] Date of Service: 09/26/2016 2:15 PM Medical Record Number: [id number] Patient Account Number: [id number] Date of Birth/Sex: April 22, 1946 (70 y.o. Male) Treating RN: Huel Coventry Primary Care Physician: Oretha Milch Other Clinician: Referring Physician: Oretha Milch Treating Physician/Extender: Rudene Re in Treatment: 81 Encounter Discharge Information Items Schedule Follow-up  Appointment: No Medication Reconciliation completed and provided to Patient/Care No Taler Kushner: Provided on Clinical Summary of Care: 09/26/2016 Form Type Recipient Paper Patient EL Electronic Signature(s) Signed: 09/26/2016 3:11:47 PM By: Gwenlyn Perking Entered By: Gwenlyn Perking on 09/26/2016 15:11:47 Smoot, Gilmer Seth Bake ([id number]) -------------------------------------------------------------------------------- Lower Extremity Assessment Details Patient Name: [name] Date of Service: 09/26/2016 2:15 PM Medical Record Number: [id number] Patient Account Number: [id number] Date of Birth/Sex: 26-Apr-1946 (70 y.o. Male) Treating RN: [name], RN, [name], [place] Sink Primary Care Physician: Oretha Milch Other Clinician: Referring Physician: Oretha Milch Treating Physician/Extender: Rudene Re in Treatment: 38 Edema Assessment Assessed: [Left: No] [Right: No] E[Left: dema] [Right: :] Calf Left: Right: Point of Measurement: 34 cm From Medial Instep cm cm Ankle Left: Right: Point of Measurement: 12 cm From Medial Instep cm cm Vascular Assessment Claudication: Claudication Assessment [Left:None] [Right:None] Pulses: Posterior Tibial Dorsalis Pedis Palpable: [Left:Yes] [Right:Yes] Extremity colors, hair growth, and conditions: Extremity Color: [Left:Mottled] [Right:Mottled] Hair Growth on Extremity: [Left:No] [Right:No] Temperature of Extremity: [Left:Warm] [Right:Warm] Capillary Refill: [Left:< 3 seconds] [Right:< 3 seconds] Toe Nail Assessment Left: Right: Thick: Yes Yes Discolored: Yes Yes Deformed: No No Improper Length and Hygiene: Yes Yes Electronic Signature(s) Signed: 09/26/2016 5:06:57 PM By: Elpidio Eric [name], RN Entered By: Elpidio Eric on 09/26/2016 14:29:59 [name] ([id number]) [name], Saxon VMarland Kitchen ([id number]) -------------------------------------------------------------------------------- Multi Wound Chart Details Patient Name: [name] Date of  Service: 09/26/2016 2:15 PM Medical Record Number: [id number] Patient Account Number: [id number] Date of Birth/Sex: 07/30/1946 (71 y.o. Male) Treating RN: [name], RN, [name], [place] Sink Primary Care Physician: Oretha Milch Other Clinician: Referring Physician: Oretha Milch Treating Physician/Extender: Rudene Re in Treatment: 38 Vital Signs Height(in): 70 Pulse(bpm): 68 Weight(lbs): 235 Blood Pressure 126/64 (mmHg): Body Mass Index(BMI): 34 Temperature(F): 98.2 Respiratory Rate 17 (breaths/min): Photos: [10:No Photos] [3:No Photos] [9:No Photos] Wound Location: [10:Right Lower Leg - Anterior Right Calcaneus] [9:Left Lower Leg - Anterior] Wounding Event: [10:Blister] [3:Pressure Injury] [9:Gradually Appeared] Primary Etiology: [10:Lymphedema] [3:Pressure Ulcer] [9:Diabetic Wound/Ulcer of the  Lower Extremity] Secondary Etiology: [10: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] [9:Venous Leg Ulcer] Comorbid History: [10:Cataracts, Chronic Obstructive Pulmonary Disease (COPD), Sleep Disease (COPD), Sleep Disease (COPD), Sleep Apnea, Angina, Congestive Heart Failure, Congestive Heart Failure, Congestive Heart Failure, Hypertension, Type II Diabetes,  Gout, Osteoarthritis, Neuropathy Osteoarthritis, Neuropathy Osteoarthritis, Neuropathy] [3:Cataracts, Chronic Obstructive Pulmonary Apnea, Angina, Hypertension, Type II Diabetes, Gout,] [9:Cataracts, Chronic Obstructive Pulmonary Apnea, Angina,  Hypertension, Type II Diabetes, Gout,] Date Acquired: [10:08/22/2016] [3:10/31/2015] [9:08/09/2016] Weeks of Treatment: [10:5] [3:38] [9:6] Wound Status: [10:Open] [3:Open] [9:Open] Clustered Wound: [10:No] [3:No] [9:Yes] Measurements L x W x D 0.1x0.1x0.1 [3:0.8x1.5x0.2] [9:17x10x0.1] (cm) Area (cm) : [10:0.008] [3:0.942] [9:133.518] Volume (cm) : [10:0.001] [3:0.188] [9:13.352] % Reduction in Area: [10:99.30%] [3:86.70%] [9:-33.30%] % Reduction in Volume: 99.20% [3:86.70%]  [9:-33.30%] Classification: [10:Partial Thickness] [3:Category/Stage III] [9:Grade 2] HBO Classification: [10:Grade 1] [3:Grade 1] [9:N/A] Exudate Amount: [10:Small] [3:Medium] [9:Large] Exudate Type: [10:Serosanguineous] [3:N/A] [9:N/A] Exudate Color: [10:red, brown] [3:N/A] [9:N/A] Wound Margin: Distinct, outline attached Distinct, outline attached Distinct, outline attached Granulation Amount: Small (1-33%) Small (1-33%) Large (67-100%) Granulation Quality: Pink, Pale Pink, Pale Red Necrotic Amount: None Present (0%) Large (67-100%) None Present (0%) Exposed Structures: Fascia: No Fascia: No Fascia: No Fat: No Fat: No Fat: No Tendon: No Tendon: No Tendon: No Muscle: No Muscle: No Muscle: No Joint: No Joint: No Joint: No Bone: No Bone: No Bone: No Limited to Skin Limited to Skin Limited to Skin Breakdown Breakdown Breakdown Epithelialization: None None None Periwound Skin Texture: Edema: Yes Edema: Yes Edema: Yes Excoriation: No Excoriation: No Excoriation: No Induration: No Induration: No Induration: No Callus: No Callus: No Callus: No Crepitus: No Crepitus: No Crepitus: No Fluctuance: No Fluctuance: No Fluctuance: No Friable: No Friable: No Friable: No Rash: No Rash: No Rash: No Scarring: No Scarring: No Scarring: No Periwound Skin Moist: Yes Moist: Yes Moist: Yes Moisture: Maceration: No Maceration: No Maceration: No Dry/Scaly: No Dry/Scaly: No Dry/Scaly: No Periwound Skin Color: Hemosiderin Staining: Yes Hemosiderin Staining: Yes Hemosiderin Staining: Yes Mottled: Yes Mottled: Yes Mottled: Yes Atrophie Blanche: No Atrophie Blanche: No Atrophie Blanche: No Cyanosis: No Cyanosis: No Cyanosis: No Ecchymosis: No Ecchymosis: No Ecchymosis: No Erythema: No Erythema: No Erythema: No Pallor: No Pallor: No Pallor: No Rubor: No Rubor: No Rubor: No Temperature: No Abnormality No Abnormality No Abnormality Tenderness on Yes No  Yes Palpation: Wound Preparation: Ulcer Cleansing: Other: Ulcer Cleansing: Other: Ulcer Cleansing: Other: surg scrub and water surg scrub and water surg scrub and water Topical Anesthetic Topical Anesthetic Topical Anesthetic Applied: None Applied: None Applied: None Treatment Notes Electronic Signature(s) Signed: 09/26/2016 5:06:57 PM By: Elpidio EricAfful, Rita [name], RN Entered By: Elpidio EricAfful, Rita on 09/26/2016 14:48:02 [name] OttoLINDLEY, Emerson V. ([id number]) -------------------------------------------------------------------------------- Multi-Disciplinary Care Plan Details Patient Name: [name] OttoLINDLEY, Brandn V. Date of Service: 09/26/2016 2:15 PM Medical Record Number: [id number] Patient Account Number: [id number] Date of Birth/Sex: Aug 06, 1946 [age] y.o. Male) Treating RN: [name], RN, [name],  [name] Primary Care Physician: Oretha MilchSMITH, [name] Other Clinician: Referring Physician: Oretha MilchSMITH, [name] Treating Physician/Extender: Rudene ReBritto, Errol Weeks in Treatment: 1338 Active Inactive Abuse / Safety / Falls / Self Care Management Nursing Diagnoses: Potential for falls Goals: Patient will remain injury free Date Initiated: 03/01/2016 Goal Status: Active Interventions: Assess fall risk on admission and as needed Notes: Nutrition Nursing Diagnoses: Imbalanced nutrition Goals: Patient/caregiver agrees to and verbalizes understanding of need to use nutritional supplements and/or vitamins as prescribed Date Initiated: 03/01/2016 Goal Status: Active Interventions: Assess patient nutrition upon admission and as needed per policy Notes: Orientation to the Wound Care  Program Nursing Diagnoses: Knowledge deficit related to the wound healing center program Goals: Patient/caregiver will verbalize understanding of the Wound Healing Center [address] [place], [facility] ([id number]) Date Initiated: 03/01/2016 Goal Status: Active Interventions: Provide education on orientation to the wound center Notes: Pain, Acute or Chronic Nursing  Diagnoses: Pain, acute or chronic: actual or potential Potential alteration in comfort, pain Goals: Patient will verbalize adequate pain control and receive pain control interventions during procedures as needed Date Initiated: 03/01/2016 Goal Status: Active Patient/caregiver will verbalize adequate pain control between visits Date Initiated: 03/01/2016 Goal Status: Active Interventions: Assess comfort goal upon admission Complete pain assessment as per visit requirements Notes: Pressure Nursing Diagnoses: Knowledge deficit related to causes and risk factors for pressure ulcer development Knowledge deficit related to management of pressures ulcers Goals: Patient/caregiver will verbalize risk factors for pressure ulcer development Date Initiated: 03/01/2016 Goal Status: Active Interventions: Assess offloading mechanisms upon admission and as needed Notes: Wound/Skin Impairment Nursing Diagnoses: DESHUN, SEDIVY ([id number]) Impaired tissue integrity Goals: Ulcer/skin breakdown will have a volume reduction of 30% by week 4 Date Initiated: 03/01/2016 Goal Status: Active Ulcer/skin breakdown will have a volume reduction of 50% by week 8 Date Initiated: 03/01/2016 Goal Status: Active Ulcer/skin breakdown will have a volume reduction of 80% by week 12 Date Initiated: 03/01/2016 Goal Status: Active Interventions: Assess ulceration(s) every visit Notes: Electronic Signature(s) Signed: 09/26/2016 5:06:57 PM By: Elpidio Eric [name], RN Entered By: Elpidio Eric on 09/26/2016 14:45:11 [name], Shalik VMarland Kitchen ([id number]) -------------------------------------------------------------------------------- Pain Assessment Details Patient Name: [name] Date of Service: 09/26/2016 2:15 PM Medical Record Number: [id number] Patient Account Number: [id number] Date of Birth/Sex: December 03, 1945 (70 y.o. Male) Treating RN: [name], RN, [name], [place] Sink Primary Care Physician: Oretha Milch Other  Clinician: Referring Physician: Oretha Milch Treating Physician/Extender: Rudene Re in Treatment: 58 Active Problems Location of Pain Severity and Description of Pain Patient Has Paino Yes Site Locations Pain Location: Pain in Ulcers Rate the pain. Current Pain Level: 4 Worst Pain Level: 4 Character of Pain Describe the Pain: Tender Pain Management and Medication Current Pain Management: Medication: Yes Rest: Yes How does your pain impact your activities of daily livingo Sleep: Yes Bathing: Yes Appetite: Yes Relationship With Others: Yes Bladder Continence: Yes Emotions: Yes Bowel Continence: Yes Work: Yes Toileting: Yes Drive: Yes Dressing: Yes Hobbies: Yes Electronic Signature(s) Signed: 09/26/2016 5:06:57 PM By: Elpidio Eric [name], RN Entered By: Elpidio Eric on 09/26/2016 14:17:50 [name], Benedetto Seth Bake ([id number]) -------------------------------------------------------------------------------- Wound Assessment Details Patient Name: [name] Date of Service: 09/26/2016 2:15 PM Medical Record Number: [id number] Patient Account Number: [id number] Date of Birth/Sex: [birth year] (70 y.o. Male) Treating RN: [name], RN, [name], Rita Primary Care Physician: Oretha Milch Other Clinician: Referring Physician: Oretha Milch Treating Physician/Extender: Rudene Re in Treatment: 38 Wound Status Wound Number: 10 Primary Lymphedema Etiology: Wound Location: Right Lower Leg - Anterior Secondary Diabetic Wound/Ulcer of the Lower Wounding Event: Blister Etiology: Extremity Date Acquired: 08/22/2016 Wound Open Weeks Of Treatment: 5 Status: Clustered Wound: No Comorbid Cataracts, Chronic Obstructive History: Pulmonary Disease (COPD), Sleep Apnea, Angina, Congestive Heart Failure, Hypertension, Type II Diabetes, Gout, Osteoarthritis, Neuropathy Wound Measurements Length: (cm) 0.1 Width: (cm) 0.1 Depth: (cm) 0.1 Area: (cm) 0.008 Volume: (cm) 0.001 %  Reduction in Area: 99.3% % Reduction in Volume: 99.2% Epithelialization: None Tunneling: No Undermining: No Wound Description Classification: Partial Thickness Diabetic Severity (Wagner): Grade 1 Wound Margin: Distinct, outline attache Exudate Amount: Small Exudate Type: Serosanguineous Exudate Color: red, brown Foul Odor After Cleansing: No  d Wound Bed Granulation Amount: Small (1-33%) Exposed Structure Granulation Quality: Pink, Pale Fascia Exposed: No Necrotic Amount: None Present (0%) Fat Layer Exposed: No Tendon Exposed: No Muscle Exposed: No Joint Exposed: No Bone Exposed: No Limited to Skin Breakdown Periwound Skin Texture Siers, Adams V. ([id number]) Texture Color No Abnormalities Noted: No No Abnormalities Noted: No Callus: No Atrophie Blanche: No Crepitus: No Cyanosis: No Excoriation: No Ecchymosis: No Fluctuance: No Erythema: No Friable: No Hemosiderin Staining: Yes Induration: No Mottled: Yes Localized Edema: Yes Pallor: No Rash: No Rubor: No Scarring: No Temperature / Pain Moisture Temperature: No Abnormality No Abnormalities Noted: No Tenderness on Palpation: Yes Dry / Scaly: No Maceration: No Moist: Yes Wound Preparation Ulcer Cleansing: Other: surg scrub and water, Topical Anesthetic Applied: None Electronic Signature(s) Signed: 09/26/2016 5:06:57 PM By: Elpidio Eric [name], RN Entered By: Elpidio Eric on 09/26/2016 14:38:02 [name], Chastin Seth Bake ([id number]) -------------------------------------------------------------------------------- Wound Assessment Details Patient Name: [name] Date of Service: 09/26/2016 2:15 PM Medical Record Number: [id number] Patient Account Number: [id number] Date of Birth/Sex: 10/26/1946 (70 y.o. Male) Treating RN: [name], RN, [name], Rita Primary Care Physician: Oretha Milch Other Clinician: Referring Physician: Oretha Milch Treating Physician/Extender: Rudene Re in Treatment: 38 Wound  Status Wound Number: 3 Primary Pressure Ulcer Etiology: Wound Location: Right Calcaneus Wound Open Wounding Event: Pressure Injury Status: Date Acquired: 10/31/2015 Comorbid Cataracts, Chronic Obstructive Weeks Of Treatment: 38 History: Pulmonary Disease (COPD), Sleep Clustered Wound: No Apnea, Angina, Congestive Heart Failure, Hypertension, Type II Diabetes, Gout, Osteoarthritis, Neuropathy Wound Measurements Length: (cm) 0.8 Width: (cm) 1.5 Depth: (cm) 0.2 Area: (cm) 0.942 Volume: (cm) 0.188 % Reduction in Area: 86.7% % Reduction in Volume: 86.7% Epithelialization: None Tunneling: No Undermining: No Wound Description Classification: Category/Stage III Diabetic Severity [address]): Grade 1 Wound Margin: Distinct, outline attache Exudate Amount: Medium Foul Odor After Cleansing: No d Wound Bed Granulation Amount: Small (1-33%) Exposed Structure Granulation Quality: Pink, Pale Fascia Exposed: No Necrotic Amount: Large (67-100%) Fat Layer Exposed: No Necrotic Quality: Adherent Slough Tendon Exposed: No Muscle Exposed: No Joint Exposed: No Bone Exposed: No Limited to Skin Breakdown Periwound Skin Texture Texture Color No Abnormalities Noted: No No Abnormalities Noted: No Callus: No Atrophie Blanche: No Crepitus: No Cyanosis: No Shrieves, Amandeep V. ([id number]) Excoriation: No Ecchymosis: No Fluctuance: No Erythema: No Friable: No Hemosiderin Staining: Yes Induration: No Mottled: Yes Localized Edema: Yes Pallor: No Rash: No Rubor: No Scarring: No Temperature / Pain Moisture Temperature: No Abnormality No Abnormalities Noted: No Dry / Scaly: No Maceration: No Moist: Yes Wound Preparation Ulcer Cleansing: Other: surg scrub and water, Topical Anesthetic Applied: None Electronic Signature(s) Signed: 09/26/2016 5:06:57 PM By: Elpidio Eric [name], RN Entered By: Elpidio Eric on 09/26/2016 14:46:52 Locker, Kamarri VMarland Kitchen  ([id number]) -------------------------------------------------------------------------------- Wound Assessment Details Patient Name: [name] Date of Service: 09/26/2016 2:15 PM Medical Record Number: [id number] Patient Account Number: [id number] Date of Birth/Sex: 17-Apr-1946 (70 y.o. Male) Treating RN: [name], RN, [name], Rita Primary Care Physician: Oretha Milch Other Clinician: Referring Physician: Oretha Milch Treating Physician/Extender: Rudene Re in Treatment: 38 Wound Status Wound Number: 9 Primary Diabetic Wound/Ulcer of the Lower Etiology: Extremity Wound Location: Left Lower Leg - Anterior Secondary Venous Leg Ulcer Wounding Event: Gradually Appeared Etiology: Date Acquired: 08/09/2016 Wound Open Weeks Of Treatment: 6 Status: Clustered Wound: Yes Comorbid Cataracts, Chronic Obstructive History: Pulmonary Disease (COPD), Sleep Apnea, Angina, Congestive Heart Failure, Hypertension, Type II Diabetes, Gout, Osteoarthritis, Neuropathy Wound Measurements Length: (cm) 17 Width: (cm) 10 Depth: (cm) 0.1 Area: (cm)  133.518 Volume: (cm) 13.352 % Reduction in Area: -33.3% % Reduction in Volume: -33.3% Epithelialization: None Tunneling: No Undermining: No Wound Description Classification: Grade 2 Wound Margin: Distinct, outline attached Exudate Amount: Large Wound Bed Granulation Amount: Large (67-100%) Exposed Structure Granulation Quality: Red Fascia Exposed: No Necrotic Amount: None Present (0%) Fat Layer Exposed: No Tendon Exposed: No Muscle Exposed: No Joint Exposed: No Bone Exposed: No Limited to Skin Breakdown Periwound Skin Texture Texture Color No Abnormalities Noted: No No Abnormalities Noted: No Callus: No Atrophie Blanche: No Peacock, Misha V. ([id number]) Crepitus: No Cyanosis: No Excoriation: No Ecchymosis: No Fluctuance: No Erythema: No Friable: No Hemosiderin Staining: Yes Induration: No Mottled: Yes Localized Edema:  Yes Pallor: No Rash: No Rubor: No Scarring: No Temperature / Pain Moisture Temperature: No Abnormality No Abnormalities Noted: No Tenderness on Palpation: Yes Dry / Scaly: No Maceration: No Moist: Yes Wound Preparation Ulcer Cleansing: Other: surg scrub and water, Topical Anesthetic Applied: None Electronic Signature(s) Signed: 09/26/2016 5:06:57 PM By: Elpidio EricAfful, Rita [name], RN Entered By: Elpidio EricAfful, Rita on 09/26/2016 14:47:33 Clint GuyLINDLEY, Levelle Seth BakeV. ([id number]) -------------------------------------------------------------------------------- Vitals Details Patient Name: [name] OttoLINDLEY, Youcef V. Date of Service: 09/26/2016 2:15 PM Medical Record Number: [id number] Patient Account Number: [id number] Date of Birth/Sex: 1946-06-29 [age] y.o. Male) Treating RN: [name], RN, [name], Rita Primary Care Physician: Oretha MilchSMITH, [name] Other Clinician: Referring Physician: Oretha MilchSMITH, [name] Treating Physician/Extender: Rudene ReBritto, Errol Weeks in Treatment: 3738 Vital Signs Time Taken: 14:17 Temperature (F): 98.2 Height (in): 70 Pulse (bpm): 68 Weight (lbs): 235 Respiratory Rate (breaths/min): 17 Body Mass Index (BMI): 33.7 Blood Pressure (mmHg): 126/64 Reference Range: 80 - 120 mg / dl Electronic Signature(s) Signed: 09/26/2016 5:06:57 PM By: Elpidio EricAfful, Rita [name], RN Entered By: Elpidio EricAfful, Rita on 09/26/2016 14:18:34

## 2016-09-27 NOTE — Progress Notes (Addendum)
[name], [name] ([id number]) Visit Report for 09/26/2016 Chief Complaint Document Details Patient Name: [name], [name] Date of Service: 09/26/2016 2:15 PM Medical Record Number: [id number] Patient Account Number: [id number] Date of Birth/Sex: 10-19-1946 (70 y.o. Male) Treating RN: [name] Primary Care Physician: [name] Other Clinician: Referring Physician: [name] Treating Physician/Extender: [name] in Treatment: 48 Information Obtained from: Patient Chief Complaint Patient is at the clinic for treatment of an open pressure ulcer the left upper thigh and gluteal region and the right heel with bilateral swelling of the legs all of it which is going on for over a year Electronic Signature(s) Signed: 09/26/2016 3:01:38 PM By: [name] Kanner MD, FACS Entered By: [name] on 09/26/2016 15:01:37 [name] ([id number]) -------------------------------------------------------------------------------- HPI Details Patient Name: [name] Date of Service: 09/26/2016 2:15 PM Medical Record Number: [id number] Patient Account Number: [id number] Date of Birth/Sex: 20-Jun-1946 (70 y.o. Male) Treating RN: [name] Primary Care Physician: [name] Other Clinician: Referring Physician: [name] Treating Physician/Extender: [name] in Treatment: 41 History of Present Illness Location: ulcerated area on the right heel, left gluteal region and thigh and then bilateral lower extremities Quality: Patient reports experiencing a sharp pain to affected area(s). Severity: Patient states wound are getting worse. Duration: Patient has had the wound for > 12 months prior to seeking treatment at the wound center Timing: Pain in wound is constant (hurts all the time) Context: The wound appeared gradually over time Modifying Factors: Other treatment(s) tried include: he sees his heart doctor and his primary care doctor Associated Signs and  Symptoms: patient has not been able to walk for over a year now HPI Description: 70 year old gentleman with a known history of hypertension, diabetes, obstructive sleep apnea, COPD, diastolic CHF, coronary artery disease was admitted to the hospital with sepsis from an ulcer of the right heel and was treated there in October 2016. he also has chronic bilateral lower extremity edema and lymphedema. He had received vancomycin, Zosyn and at that stage and x-rays showed hardware in the right ankle but no evidence of osteomyelitis. he was a former smoker. he was also treated with Augmentin orally for 10 days. He is either bedbound or wheelchair-bound and does not ambulate by himself. 01/19/2016 -- he has not been seen here for 3 weeks and this was because he was admitted to [facility] between 223 and 01/08/2016 for sepsis, UTI and pneumonia involving the left lung. he was treated with IV vancomycin and Zosyn and then meropenem. Was discharged home on oral Bactrim for 2 weeks none of his vascular test or x-rays were done and we will reorder these. 01/26/2016 -- he has not yet done the x-ray of his foot and is vascular tests are still pending. I have asked him to work on these with his nursing home staff. Addendum: he has got an x-ray of the right foot done which shows that his osteopenia but no specific ostial lysis or abnormal periosteal reaction. Final impression was degenerative changes with dorsal foot soft tissue swelling. 02/16/2016 -- lower extremity arterial duplex examination shows a 50-99% stenosis of the right tibioperoneal trunk. He had biphasic flow in the right SFA, popliteal and tibioperoneal trunk. Left-sided he had triphasic flow throughout 02/29/2016 -- he is awaiting his vascular opinion with Dr. Gilda [name] which is to be done on April 24 03/08/2016 -- he has not kept his appointment with Dr. Gilda [name] on April 24 and does not seem to know what happened  about  this. it's difficult to gauge whether he is in full control of his mental faculties as at times he is extremely rude to the nursing staff. 03/22/2016 -- the patient was seen by Dr. 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 Recommended that the patient had severe atherosclerotic changes of both lower extremities associated with ulceration and tissue loss of the foot. This is a limb threatening ischemia and the patient was recommended to undergo [name], [name] ([id number]) angiography of the lower extremity with a hope for intervention for limb salvage. Patient agreed and will proceed to angiography. He was admitted to the hospital between May 2 and 03/15/2016 - he underwent induction of a catheter into his right lower extremity and third order catheter placement with contrast injection to the right lower extremity for distal runoff. Percutaneous transluminal angioplasty of the right superficial femoral and popliteal arteries were done. He also had a right peroneal angioplasty. Patient had a postoperative hematoma and was admitted for observation and in the next 24 hours he was very agitated and combative and had to be seen by psychiatric. He had a follow-up with Dr. Gilda [name] in 3 weeks. 04/18/2016 -- notes reviewed from the vascular office where he was seen for his postop visit after the PTA of the right SFA and popliteal arteries on 03/12/2016. He underwent an ABI which showed right ABI to be more than 1.3 and left to be more than 1 more than 1.3, great toe and PPG waveforms are decreased bilaterally. No additional intervention indicated at this time and the patient was to follow-up in 3 months with an ABI and bilateral lower extremity duplex study. 05/02/2016 -- he complains that his compression stockings are causing him a lot of discomfort and he is not happy wearing  them. 05/30/2016 -- the swelling of both legs has increased again and he has had blisters which have opened out into ulcerations. 09/12/2016 -- the lymphedema pumps are still not delivered and hopefully will be done within this week. As far as his skin substitute goes we are still awaiting a response from his [employer] Electronic Signature(s) Signed: 09/26/2016 3:01:44 PM By: [name] KannerBritto, Reeshemah Nazaryan MD, FACS Entered By: [name] KannerBritto, Kiasha Bellin on 09/26/2016 15:01:43 [name], [name] ([id number]) -------------------------------------------------------------------------------- Physical Exam Details Patient Name: [name], [name] Date of Service: 09/26/2016 2:15 PM Medical Record Number: [id number] Patient Account Number: [id number] Date of Birth/Sex: December 11, 1945 [age] y.o. Male) Treating RN: [name] CoventryWoody, Kim Primary Care Physician: [name] MilchSMITH, [name] Other Clinician: Referring Physician: Oretha MilchSMITH, [name] Treating Physician/Extender: [name] ReBritto, Mattye Verdone Weeks in Treatment: 1938 Constitutional . Pulse regular. Respirations normal and unlabored. Afebrile. . Eyes Nonicteric. Reactive to light. Ears, Nose, Mouth, and Throat Lips, teeth, and gums WNL.Marland Kitchen. Moist mucosa without lesions. Neck supple and nontender. No palpable supraclavicular or cervical adenopathy. Normal sized without goiter. Respiratory WNL. No retractions.. Cardiovascular Pedal Pulses WNL. No clubbing, cyanosis or edema. Lymphatic No adneopathy. No adenopathy. No adenopathy. Musculoskeletal Adexa without tenderness or enlargement.. Digits and nails w/o clubbing, cyanosis, infection, petechiae, ischemia, or inflammatory conditions.. Integumentary (Hair, Skin) No suspicious lesions. No crepitus or fluctuance. No peri-wound warmth or erythema. No masses.Marland Kitchen. Psychiatric Judgement and insight Intact.. No evidence of depression, anxiety, or agitation.. Notes the edema control on the right lower extremity is much better than the left but both areas have very superficial  breakdown of skin and will benefit from local care with compression. The right calcaneal wound  is looking clean and required no sharp debridement today. Electronic Signature(s) Signed: 09/26/2016 3:02:28 PM By: [name] Kanner MD, FACS Entered By: [name] on 09/26/2016 15:02:27 [name] ([id number]) -------------------------------------------------------------------------------- Physician Orders Details Patient Name: [name] Date of Service: 09/26/2016 2:15 PM Medical Record Number: [id number] Patient Account Number: [id number] Date of Birth/Sex: [birth year] (70 y.o. Male) Treating RN: [name], RN, [name], Arispe Sink Primary Care Physician: [name] Other Clinician: Referring Physician: [name] Treating Physician/Extender: [name] in Treatment: 45 Verbal / Phone Orders: Yes Clinician: [name], RN, [name], Rita Read Back and Verified: Yes Diagnosis Coding Wound Cleansing Wound #10 Right,Anterior Lower Leg o No tub bath. - only sink bath or bed bath Wound #3 Right Calcaneus o No tub bath. - only sink bath or bed bath Wound #9 Left,Anterior Lower Leg o No tub bath. - only sink bath or bed bath Anesthetic Wound #10 Right,Anterior Lower Leg o Topical Lidocaine 4% cream applied to wound bed prior to debridement - for clinic use only Wound #3 Right Calcaneus o Topical Lidocaine 4% cream applied to wound bed prior to debridement - for clinic use only Wound #9 Left,Anterior Lower Leg o Topical Lidocaine 4% cream applied to wound bed prior to debridement - for clinic use only Skin Barriers/Peri-Wound Care Wound #10 Right,Anterior Lower Leg o Barrier cream - around the heel wound to reddened and irritated areas Wound #3 Right Calcaneus o Barrier cream - around the heel wound to reddened and irritated areas o Barrier cream - around the heel wound to reddened and irritated areas Wound #9 Left,Anterior Lower Leg o Barrier cream - around the heel  wound to reddened and irritated areas o Barrier cream - around the heel wound to reddened and irritated areas Primary Wound Dressing Wound #3 Right Calcaneus o Hydrafera Blue Wound #10 Right,Anterior Lower Leg Bessent, Cottrell [name]. ([id number]) o Aquacel Ag Wound #9 Left,Anterior Lower Leg o Aquacel Ag Secondary Dressing Wound #3 Right Calcaneus o Dry Gauze - Heel cups Dressing Change Frequency Wound #10 Right,Anterior Lower Leg o Change dressing every week Wound #3 Right Calcaneus o Change dressing every other day. - unless saturated or soiled Wound #9 Left,Anterior Lower Leg o Change dressing every week Follow-up Appointments Wound #3 Right Calcaneus o Return Appointment in 1 week. o Nurse Visit as needed - on wednesday 10/03/16 Edema Control o 3 Layer Compression System - Bilateral o Other: - unna to anchor Off-Loading Wound #3 Right Calcaneus o Turn and reposition every 2 hours o Other: - sage boots at night and float heel when lying in bed Additional Orders / Instructions Wound #3 Right Calcaneus o Increase protein intake. Medications-please add to medication list. Wound #3 Right Calcaneus o Other: - Vitamin C, Vitamin A, Zinc, multivitamin Electronic Signature(s) Signed: 09/26/2016 3:12:05 PM By: Elpidio Eric [name], RN Signed: 09/26/2016 4:11:06 PM By: [name] Kanner MD, FACS [name], [name] ([id number]) Entered By: Elpidio Eric on 09/26/2016 15:12:05 [name] ([id number]) -------------------------------------------------------------------------------- Problem List Details Patient Name: [name] Date of Service: 09/26/2016 2:15 PM Medical Record Number: [id number] Patient Account Number: [id number] Date of Birth/Sex: 12-19-1945 (70 y.o. Male) Treating RN: [name] Primary Care Physician: [name] Other Clinician: Referring Physician: [name] Treating Physician/Extender: [name] in Treatment:  26 Active Problems ICD-10 Encounter Code Description Active Date Diagnosis E11.621 Type 2 diabetes mellitus with foot ulcer 01/01/2016 Yes L89.613 Pressure ulcer of right heel, stage 3 01/01/2016 Yes E66.01 Morbid (severe) obesity due to excess calories 01/01/2016  Yes I89.0 Lymphedema, not elsewhere classified 01/01/2016 Yes M70.871 Other soft tissue disorders related to use, overuse and 01/19/2016 Yes pressure, right ankle and foot 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/16/2016 Yes ulceration of heel and midfoot Inactive Problems Resolved Problems ICD-10 Code Description Active Date Resolved Date L89.322 Pressure ulcer of left buttock, stage 2 01/01/2016 01/01/2016 Electronic Signature(s) Signed: 09/26/2016 3:01:23 PM By: [name] Kanner MD, FACS Entered By: [name] on 09/26/2016 15:01:22 [name] ([id number]) [name], Misael VMarland Kitchen ([id number]) -------------------------------------------------------------------------------- Progress Note Details Patient Name: [name] Date of Service: 09/26/2016 2:15 PM Medical Record Number: [id number] Patient Account Number: [id number] Date of Birth/Sex: 08/07/1946 (70 y.o. Male) Treating RN: [name] Primary Care Physician: [name] Other Clinician: Referring Physician: [name] Treating Physician/Extender: [name] in Treatment: 39 Subjective Chief Complaint Information obtained from Patient Patient is at the clinic for treatment of an open pressure ulcer the left upper thigh and gluteal region and the right heel with bilateral swelling of the legs all of it which is going on for over a year History of Present Illness (HPI) The following HPI elements were documented for the patient's wound: Location: ulcerated area on the right heel, left gluteal region and thigh and then bilateral lower extremities Quality: Patient reports experiencing a sharp pain to affected area(s). Severity:  Patient states wound are getting worse. Duration: Patient has had the wound for > 12 months prior to seeking treatment at the wound center Timing: Pain in wound is constant (hurts all the time) Context: The wound appeared gradually over time Modifying Factors: Other treatment(s) tried include: he sees his heart doctor and his primary care doctor Associated Signs and Symptoms: patient has not been able to walk for over a year now 70 year old gentleman with a known history of hypertension, diabetes, obstructive sleep apnea, COPD, diastolic CHF, coronary artery disease was admitted to the hospital with sepsis from an ulcer of the right heel and was treated there in October 2016. he also has chronic bilateral lower extremity edema and lymphedema. He had received vancomycin, Zosyn and at that stage and x-rays showed hardware in the right ankle but no evidence of osteomyelitis. he was a former smoker. he was also treated with Augmentin orally for 10 days. He is either bedbound or wheelchair-bound and does not ambulate by himself. 01/19/2016 -- he has not been seen here for 3 weeks and this was because he was admitted to [facility] between 223 and 01/08/2016 for sepsis, UTI and pneumonia involving the left lung. he was treated with IV vancomycin and Zosyn and then meropenem. Was discharged home on oral Bactrim for 2 weeks none of his vascular test or x-rays were done and we will reorder these. 01/26/2016 -- he has not yet done the x-ray of his foot and is vascular tests are still pending. I have asked him to work on these with his nursing home staff. Addendum: he has got an x-ray of the right foot done which shows that his osteopenia but no specific ostial lysis or abnormal periosteal reaction. Final impression was degenerative changes with dorsal foot soft tissue swelling. 02/16/2016 -- lower extremity arterial duplex examination shows a 50-99% stenosis of the  right tibioperoneal trunk. He had biphasic flow in the right SFA, popliteal and tibioperoneal trunk. Left-sided he had triphasic flow throughout [name], [name] ([id number]) 02/29/2016 -- he is awaiting his vascular opinion with Dr. Gilda [name] which is to be done on April 24 03/08/2016 --  he has not kept his appointment with Dr. Gilda [name] on April 24 and does not seem to know what happened about this. it's difficult to gauge whether he is in full control of his mental faculties as at times he is extremely rude to the nursing staff. 03/22/2016 -- the patient was seen by Dr. 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 Recommended that the patient had severe atherosclerotic changes of both lower extremities associated with ulceration and tissue loss of the foot. This is a limb threatening ischemia and the patient was recommended to undergo angiography of the lower extremity with a hope for intervention for limb salvage. Patient agreed and will proceed to angiography. He was admitted to the hospital between May 2 and 03/15/2016 - he underwent induction of a catheter into his right lower extremity and third order catheter placement with contrast injection to the right lower extremity for distal runoff. Percutaneous transluminal angioplasty of the right superficial femoral and popliteal arteries were done. He also had a right peroneal angioplasty. Patient had a postoperative hematoma and was admitted for observation and in the next 24 hours he was very agitated and combative and had to be seen by psychiatric. He had a follow-up with Dr. Gilda [name] in 3 weeks. 04/18/2016 -- notes reviewed from the vascular office where he was seen for his postop visit after the PTA of the right SFA and popliteal arteries on 03/12/2016. He underwent an ABI which showed right ABI to be more than 1.3 and left to be  more than 1 more than 1.3, great toe and PPG waveforms are decreased bilaterally. No additional intervention indicated at this time and the patient was to follow-up in 3 months with an ABI and bilateral lower extremity duplex study. 05/02/2016 -- he complains that his compression stockings are causing him a lot of discomfort and he is not happy wearing them. 05/30/2016 -- the swelling of both legs has increased again and he has had blisters which have opened out into ulcerations. 09/12/2016 -- the lymphedema pumps are still not delivered and hopefully will be done within this week. As far as his skin substitute goes we are still awaiting a response from his insurance Objective Constitutional Pulse regular. Respirations normal and unlabored. Afebrile. Vitals Time Taken: 2:17 PM, Height: 70 in, Weight: 235 lbs, BMI: 33.7, Temperature: 98.2 F, Pulse: 68 bpm, Respiratory Rate: 17 breaths/min, Blood Pressure: 126/64 mmHg. Eyes Nonicteric. Reactive to light. Ears, Nose, Mouth, and Throat Weldy, Chaston [name]. ([id number]) Lips, teeth, and gums WNL.Marland Kitchen Moist mucosa without lesions. Neck supple and nontender. No palpable supraclavicular or cervical adenopathy. Normal sized without goiter. Respiratory WNL. No retractions.. Cardiovascular Pedal Pulses WNL. No clubbing, cyanosis or edema. Lymphatic No adneopathy. No adenopathy. No adenopathy. Musculoskeletal Adexa without tenderness or enlargement.. Digits and nails w/o clubbing, cyanosis, infection, petechiae, ischemia, or inflammatory conditions.Marland Kitchen Psychiatric Judgement and insight Intact.. No evidence of depression, anxiety, or agitation.. General Notes: the edema control on the right lower extremity is much better than the left but both areas have very superficial breakdown of skin and will benefit from local care with compression. The right calcaneal wound is looking clean and required no sharp debridement today. Integumentary (Hair, Skin) No  suspicious lesions. No crepitus or fluctuance. No peri-wound warmth or erythema. No masses.. Wound #10 status is Open. Original cause of wound was Blister. The wound is located on the Right,Anterior Lower Leg.  The wound measures 0.1cm length x 0.1cm width x 0.1cm depth; 0.008cm^2 area and 0.001cm^3 volume. The wound is limited to skin breakdown. There is no tunneling or undermining noted. There is a small amount of serosanguineous drainage noted. The wound margin is distinct with the outline attached to the wound base. There is small (1-33%) pink, pale granulation within the wound bed. There is no necrotic tissue within the wound bed. The periwound skin appearance exhibited: Localized Edema, Moist, Hemosiderin Staining, Mottled. The periwound skin appearance did not exhibit: Callus, Crepitus, Excoriation, Fluctuance, Friable, Induration, Rash, Scarring, Dry/Scaly, Maceration, Atrophie Blanche, Cyanosis, Ecchymosis, Pallor, Rubor, Erythema. Periwound temperature was noted as No Abnormality. The periwound has tenderness on palpation. Wound #3 status is Open. Original cause of wound was Pressure Injury. The wound is located on the Right Calcaneus. The wound measures 0.8cm length x 1.5cm width x 0.2cm depth; 0.942cm^2 area and 0.188cm^3 volume. The wound is limited to skin breakdown. There is no tunneling or undermining noted. There is a medium amount of drainage noted. The wound margin is distinct with the outline attached to the wound base. There is small (1-33%) pink, pale granulation within the wound bed. There is a large (67- 100%) amount of necrotic tissue within the wound bed including Adherent Slough. The periwound skin appearance exhibited: Localized Edema, Moist, Hemosiderin Staining, Mottled. The periwound skin appearance did not exhibit: Callus, Crepitus, Excoriation, Fluctuance, Friable, Induration, Rash, Scarring, Dry/Scaly, Maceration, Atrophie Blanche, Cyanosis, Ecchymosis, Pallor,  Rubor, Erythema. Periwound temperature was noted as No Abnormality. [name], [name] ([id number]) Wound #9 status is Open. Original cause of wound was Gradually Appeared. The wound is located on the Left,Anterior Lower Leg. The wound measures 17cm length x 10cm width x 0.1cm depth; 133.518cm^2 area and 13.352cm^3 volume. The wound is limited to skin breakdown. There is no tunneling or undermining noted. There is a large amount of drainage noted. The wound margin is distinct with the outline attached to the wound base. There is large (67-100%) red granulation within the wound bed. There is no necrotic tissue within the wound bed. The periwound skin appearance exhibited: Localized Edema, Moist, Hemosiderin Staining, Mottled. The periwound skin appearance did not exhibit: Callus, Crepitus, Excoriation, Fluctuance, Friable, Induration, Rash, Scarring, Dry/Scaly, Maceration, Atrophie Blanche, Cyanosis, Ecchymosis, Pallor, Rubor, Erythema. Periwound temperature was noted as No Abnormality. The periwound has tenderness on palpation. Assessment Active Problems ICD-10 E11.621 - Type 2 diabetes mellitus with foot ulcer L89.613 - Pressure ulcer of right heel, stage 3 E66.01 - Morbid (severe) obesity due to excess calories I89.0 - Lymphedema, not elsewhere classified M70.871 - Other soft tissue disorders related to use, overuse and pressure, right ankle and foot I70.234 - Atherosclerosis of native arteries of right leg with ulceration of heel and midfoot Plan Wound Cleansing: Wound #10 Right,Anterior Lower Leg: No tub bath. - only sink bath or bed bath Wound #3 Right Calcaneus: No tub bath. - only sink bath or bed bath Wound #9 Left,Anterior Lower Leg: No tub bath. - only sink bath or bed bath Anesthetic: Wound #10 Right,Anterior Lower Leg: Topical Lidocaine 4% cream applied to wound bed prior to debridement - for clinic use only Wound #3 Right Calcaneus: Topical Lidocaine 4% cream applied to  wound bed prior to debridement - for clinic use only Wound #9 Left,Anterior Lower Leg: Topical Lidocaine 4% cream applied to wound bed prior to debridement - for clinic use only Skin Barriers/Peri-Wound Care: Wound #10 Right,Anterior Lower Leg: Barrier cream - around the heel wound to  reddened and irritated areas [name], [name] ([id number]) Wound #3 Right Calcaneus: Barrier cream - around the heel wound to reddened and irritated areas Barrier cream - around the heel wound to reddened and irritated areas Wound #9 Left,Anterior Lower Leg: Barrier cream - around the heel wound to reddened and irritated areas Barrier cream - around the heel wound to reddened and irritated areas Primary Wound Dressing: Wound #3 Right Calcaneus: Hydrafera Blue Wound #10 Right,Anterior Lower Leg: Aquacel Ag Wound #9 Left,Anterior Lower Leg: Aquacel Ag Secondary Dressing: Wound #3 Right Calcaneus: Dry Gauze - Heel cups Dressing Change Frequency: Wound #10 Right,Anterior Lower Leg: Change dressing every week Wound #3 Right Calcaneus: Change dressing every other day. - unless saturated or soiled Wound #9 Left,Anterior Lower Leg: Change dressing every week Follow-up Appointments: Wound #3 Right Calcaneus: Return Appointment in 1 week. Nurse Visit as needed - on wednesday 10/03/16 Edema Control: 3 Layer Compression System - Bilateral Other: - unna to anchor Off-Loading: Wound #3 Right Calcaneus: Turn and reposition every 2 hours Other: - sage boots at night and float heel when lying in bed Additional Orders / Instructions: Wound #3 Right Calcaneus: Increase protein intake. Medications-please add to medication list.: Wound #3 Right Calcaneus: Other: - Vitamin C, Vitamin A, Zinc, multivitamin I have recommended using Hydrofera Blue for his heel to be changed every other day. he has received his lymphedema pumps which hopefully will make a difference to his overall general wound healing. he  is finding it difficult to get someone to help him put these on and off at his nursing home and we will attempt to talk to the nursing supervisor there. [name], [name] ([id number]) We will continue with silver alginate and a 3 layer Profore. Electronic Signature(s) Signed: 09/26/2016 4:14:07 PM By: [name] KannerBritto, Brinae Woods MD, FACS Previous Signature: 09/26/2016 3:03:45 PM Version By: [name] KannerBritto, Marquies Wanat MD, FACS Entered By: [name] KannerBritto, Rumor Sun on 09/26/2016 16:14:07 [name], [name] ([id number]) -------------------------------------------------------------------------------- SuperBill Details Patient Name: [name], Jemery [name]. Date of Service: 09/26/2016 Medical Record Number: [id number] Patient Account Number: [id number] Date of Birth/Sex: 1946-11-03 [age] y.o. 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 Physician Procedures CPT4 Code: [id number] Description: 99213 - WC PHYS LEVEL 3 - EST PT ICD-10 Description Diagnosis E11.621 Type 2 diabetes mellitus with foot ulcer I89.0 Lymphedema, not elsewhere classified L89.613 Pressure ulcer of right heel, stage 3 Modifier: Quantity: 1 Electronic Signature(s) Signed: 09/26/2016 5:06:39 PM By: Elpidio EricAfful, Rita [name], RN Previous  Signature: 09/26/2016 3:04:00 PM Version By: [name] KannerBritto, Priyanka Causey MD, FACS Entered By: Elpidio EricAfful, Rita on 09/26/2016 17:06:37

## 2016-10-02 DIAGNOSIS — E11621 Type 2 diabetes mellitus with foot ulcer: Secondary | ICD-10-CM | POA: Diagnosis not present

## 2016-10-08 NOTE — Progress Notes (Signed)
[name], [name] ([id number]) Visit Report for 10/02/2016 Arrival Information Details Patient Name: [name], [name] Date of Service: 10/02/2016 2:15 PM Medical Record Number: [id number] Patient Account Number: [id number] Date of Birth/Sex: Mar 11, 1946 [age] y.o. Male) Treating RN: Phillis HaggisPinkerton, Debi Primary Care Physician: Oretha MilchSMITH, [name] Other Clinician: Referring Physician: Oretha MilchSMITH, [name] Treating Physician/Extender: Kathreen Cosieroulter, Leah Weeks in Treatment: 3239 Visit Information History Since Last Visit All ordered tests and consults were completed: No Patient Arrived: Wheel Chair Added or deleted any medications: No Arrival Time: 14:22 Any new allergies or adverse reactions: No Accompanied By: self Had a fall or experienced change in No Transfer Assistance: Easy[employer] activities of daily living that may affect Patient Lift risk of falls: Patient Identification Verified: Yes Signs or symptoms of abuse/neglect since last No Secondary Verification Process Yes visito Completed: Hospitalized since last visit: No Patient Requires Transmission- No Pain Present Now: No Based Precautions: Patient Has Alerts: Yes Patient Alerts: DM II Electronic Signature(s) Signed: 10/07/2016 4:53:13 PM By: Alejandro MullingPinkerton, Debra Entered By: Alejandro MullingPinkerton, Debra on 10/02/2016 14:22:37 [name], [name] ([id number]) -------------------------------------------------------------------------------- Encounter Discharge Information Details Patient Name: [name], [name] Date of Service: 10/02/2016 2:15 PM Medical Record Number: [id number] Patient Account Number: [id number] Date of Birth/Sex: Mar 11, 1946 [age] y.o. Male) Treating RN: Phillis HaggisPinkerton, Debi Primary Care Physician: Oretha MilchSMITH, [name] Other Clinician: Referring Physician: Oretha MilchSMITH, [name] Treating Physician/Extender: Kathreen Cosieroulter, Leah Weeks in Treatment: 6739 Encounter Discharge Information Items Discharge Pain Level: 0 Discharge Condition: Stable Ambulatory Status:  Wheelchair Nursing Discharge Destination: Home Transportation: Other Accompanied By: self Schedule Follow-up Appointment: Yes Medication Reconciliation completed Yes and provided to Patient/Care [name]: Clinical Summary of Care: Electronic Signature(s) Signed: 10/07/2016 4:53:13 PM By: Alejandro MullingPinkerton, Debra Entered By: Alejandro MullingPinkerton, Debra on 10/02/2016 15:30:11 [name], [name] ([id number]) -------------------------------------------------------------------------------- Patient/Caregiver Education Details Patient Name: [name], [name] Date of Service: 10/02/2016 2:15 PM Medical Record Number: [id number] Patient Account Number: [id number] Date of Birth/Gender: Mar 11, 1946 [age] y.o. Male) Treating RN: Phillis HaggisPinkerton, Debi Primary Care Physician: Oretha MilchSMITH, [name] Other Clinician: Referring Physician: Oretha MilchSMITH, [name] Treating Physician/Extender: Kathreen Cosieroulter, Leah Weeks in Treatment: 639 Education Assessment Education Provided To: Patient Education Topics Provided Wound/Skin Impairment: Handouts: Other: chnage dressing as ordered Methods: Demonstration, Explain/Verbal Responses: State content correctly Electronic Signature(s) Signed: 10/07/2016 4:53:13 PM By: Alejandro MullingPinkerton, Debra Entered By: Alejandro MullingPinkerton, Debra on 10/02/2016 15:29:54 Candler, Kellie Seth BakeV. ([id number]) -------------------------------------------------------------------------------- Wound Assessment Details Patient Name: [name], [name] Date of Service: 10/02/2016 2:15 PM Medical Record Number: [id number] Patient Account Number: [id number] Date of Birth/Sex: Mar 11, 1946 [age] y.o. Male) Treating RN: Phillis HaggisPinkerton, Debi Primary Care Physician: Oretha MilchSMITH, [name] Other Clinician: Referring Physician: Oretha MilchSMITH, [name] Treating Physician/Extender: Bonnell Publicoulter, Leah Weeks in Treatment: 39 Wound Status Wound Number: 10 Primary Lymphedema Etiology: Wound Location: Right Lower Leg - Anterior Secondary Diabetic Wound/Ulcer of the Lower Wounding Event: Blister Etiology:  Extremity Date Acquired: 08/22/2016 Wound Open Weeks Of Treatment: 5 Status: Clustered Wound: No Comorbid Cataracts, Chronic Obstructive History: Pulmonary Disease (COPD), Sleep Apnea, Angina, Congestive Heart Failure, Hypertension, Type II Diabetes, Gout, Osteoarthritis, Neuropathy Photos Photo Uploaded By: Alejandro MullingPinkerton, Debra on 10/02/2016 15:28:36 Wound Measurements Length: (cm) 2.5 Width: (cm) 3 Depth: (cm) 0.1 Area: (cm) 5.89 Volume: (cm) 0.589 % Reduction in Area: -400% % Reduction in Volume: -399.2% Epithelialization: None Tunneling: No Undermining: No Wound Description Classification: Partial Thickness Foul Odo Diabetic Severity (Wagner): Grade 1 Wound Margin: Distinct, outline attached Exudate Amount: Large Exudate Type: Serosanguineous Exudate Color: red, brown r After Cleansing: No Wound Bed [name], [name]. ([id number]) Granulation Amount: Large (67-100%) Exposed Structure Granulation Quality: Pink, Pale Fascia Exposed: No Necrotic  Amount: Small (1-33%) Fat Layer Exposed: No Necrotic Quality: Adherent Slough Tendon Exposed: No Muscle Exposed: No Joint Exposed: No Bone Exposed: No Limited to Skin Breakdown Periwound Skin Texture Texture Color No Abnormalities Noted: No No Abnormalities Noted: No Callus: No Atrophie Blanche: No Crepitus: No Cyanosis: No Excoriation: No Ecchymosis: No Fluctuance: No Erythema: No Friable: No Hemosiderin Staining: Yes Induration: No Mottled: Yes Localized Edema: Yes Pallor: No Rash: No Rubor: No Scarring: No Temperature / Pain Moisture Temperature: No Abnormality No Abnormalities Noted: No Tenderness on Palpation: Yes Dry / Scaly: No Maceration: No Moist: Yes Wound Preparation Ulcer Cleansing: Other: surg scrub and water, Topical Anesthetic Applied: None Treatment Notes Wound #10 (Right, Anterior Lower Leg) 1. Cleansed with: Clean wound with Normal Saline Cleanse wound with antibacterial soap  and water 4. Dressing Applied: Aquacel Ag 5. Secondary Dressing Applied ABD Pad 7. Secured with Tape 3 Layer Compression System - Bilateral Notes unna to anchor BREAKER, [name] ([id number]) Electronic Signature(s) Signed: 10/07/2016 4:53:13 PM By: Alejandro Mulling Entered By: Alejandro Mulling on 10/02/2016 14:31:33 Murakami, [name] ([id number]) -------------------------------------------------------------------------------- Wound Assessment Details Patient Name: [name] Otto. Date of Service: 10/02/2016 2:15 PM Medical Record Number: [id number] Patient Account Number: [id number] Date of Birth/Sex: Aug 12, 1946 (70 y.o. Male) Treating RN: Phillis Haggis Primary Care Physician: Oretha Milch Other Clinician: Referring Physician: Oretha Milch Treating Physician/Extender: Bonnell Public Weeks in Treatment: 39 Wound Status Wound Number: 3 Primary Pressure Ulcer Etiology: Wound Location: Right Calcaneus Wound Open Wounding Event: Pressure Injury Status: Date Acquired: 10/31/2015 Comorbid Cataracts, Chronic Obstructive Weeks Of Treatment: 39 History: Pulmonary Disease (COPD), Sleep Clustered Wound: No Apnea, Angina, Congestive Heart Failure, Hypertension, Type II Diabetes, Gout, Osteoarthritis, Neuropathy Photos Photo Uploaded By: Alejandro Mulling on 10/02/2016 15:28:36 Wound Measurements Length: (cm) 0.5 Width: (cm) 1.5 Depth: (cm) 0.2 Area: (cm) 0.589 Volume: (cm) 0.118 % Reduction in Area: 91.7% % Reduction in Volume: 91.7% Epithelialization: None Tunneling: No Undermining: No Wound Description Classification: Category/Stage III Foul Odor Diabetic Severity (Wagner): Grade 1 Wound Margin: Distinct, outline attached Exudate Amount: Large Exudate Type: Serous Exudate Color: amber After Cleansing: No Wound Bed Granulation Amount: Medium (34-66%) Exposed Structure Granulation Quality: Pink, Pale Fascia Exposed: No Necrotic Amount: Medium (34-66%) Fat Layer  Exposed: No Mozer, Jayan [name]. ([id number]) Necrotic Quality: Adherent Slough Tendon Exposed: No Muscle Exposed: No Joint Exposed: No Bone Exposed: No Limited to Skin Breakdown Periwound Skin Texture Texture Color No Abnormalities Noted: No No Abnormalities Noted: No Callus: No Atrophie Blanche: No Crepitus: No Cyanosis: No Excoriation: No Ecchymosis: No Fluctuance: No Erythema: No Friable: No Hemosiderin Staining: Yes Induration: No Mottled: Yes Localized Edema: Yes Pallor: No Rash: No Rubor: No Scarring: No Temperature / Pain Moisture Temperature: No Abnormality No Abnormalities Noted: No Dry / Scaly: No Maceration: No Moist: Yes Wound Preparation Ulcer Cleansing: Other: surg scrub and water, Topical Anesthetic Applied: None Treatment Notes Wound #3 (Right Calcaneus) 1. Cleansed with: Clean wound with Normal Saline Cleanse wound with antibacterial soap and water 4. Dressing Applied: Hydrafera Blue 5. Secondary Dressing Applied Dry Gauze Kerlix/Conform 7. Secured with Tape Notes heel cup Electronic Signature(s) Signed: 10/07/2016 4:53:13 PM By: Alejandro Mulling Entered By: Alejandro Mulling on 10/02/2016 14:33:43 Bick, Urie [name]. ([id number]) [name], [name] VMarland Kitchen ([id number]) -------------------------------------------------------------------------------- Wound Assessment Details Patient Name: [name] Otto. Date of Service: 10/02/2016 2:15 PM Medical Record Number: [id number] Patient Account Number: [id number] Date of Birth/Sex: [birth year] (70 y.o. Male) Treating RN: Phillis Haggis Primary Care Physician: Oretha Milch Other Clinician: Referring Physician: Oretha Milch  Treating Physician/Extender: Bonnell Publicoulter, Leah Weeks in Treatment: 39 Wound Status Wound Number: 9 Primary Diabetic Wound/Ulcer of the Lower Etiology: Extremity Wound Location: Left Lower Leg - Anterior Secondary Venous Leg Ulcer Wounding Event: Gradually Appeared Etiology: Date  Acquired: 08/09/2016 Wound Open Weeks Of Treatment: 6 Status: Clustered Wound: Yes Comorbid Cataracts, Chronic Obstructive History: Pulmonary Disease (COPD), Sleep Apnea, Angina, Congestive Heart Failure, Hypertension, Type II Diabetes, Gout, Osteoarthritis, Neuropathy Photos Photo Uploaded By: Alejandro MullingPinkerton, Debra on 10/02/2016 15:28:47 Wound Measurements Length: (cm) 15 Width: (cm) 8 Depth: (cm) 0.1 Area: (cm) 94.248 Volume: (cm) 9.425 % Reduction in Area: 5.9% % Reduction in Volume: 5.9% Epithelialization: None Tunneling: No Undermining: No Wound Description Classification: Grade 2 Wound Margin: Distinct, outline attached Exudate Amount: Large Exudate Type: Serosanguineous Exudate Color: red, brown Foul Odor After Cleansing: No Wound Bed Granulation Amount: Large (67-100%) Exposed Structure Blundell, Meade [name]. ([id number]) Granulation Quality: Red Fascia Exposed: No Necrotic Amount: Small (1-33%) Fat Layer Exposed: No Necrotic Quality: Adherent Slough Tendon Exposed: No Muscle Exposed: No Joint Exposed: No Bone Exposed: No Limited to Skin Breakdown Periwound Skin Texture Texture Color No Abnormalities Noted: No No Abnormalities Noted: No Callus: No Atrophie Blanche: No Crepitus: No Cyanosis: No Excoriation: No Ecchymosis: No Fluctuance: No Erythema: No Friable: No Hemosiderin Staining: Yes Induration: No Mottled: Yes Localized Edema: Yes Pallor: No Rash: No Rubor: No Scarring: No Temperature / Pain Moisture Temperature: No Abnormality No Abnormalities Noted: No Tenderness on Palpation: Yes Dry / Scaly: No Maceration: No Moist: Yes Wound Preparation Ulcer Cleansing: Other: surg scrub and water, Topical Anesthetic Applied: None Treatment Notes Wound #9 (Left, Anterior Lower Leg) 1. Cleansed with: Clean wound with Normal Saline Cleanse wound with antibacterial soap and water 4. Dressing Applied: Aquacel Ag 5. Secondary Dressing  Applied ABD Pad 7. Secured with Tape 3 Layer Compression System - Bilateral Notes unna to anchor Electronic Signature(s) [name], Drayce [name]. ([id number]) Signed: 10/07/2016 4:53:13 PM By: Alejandro MullingPinkerton, Debra Entered By: Alejandro MullingPinkerton, Debra on 10/02/2016 14:34:17

## 2016-10-10 ENCOUNTER — Encounter: Payer: Medicare Other | Admitting: Surgery

## 2016-10-10 DIAGNOSIS — E11621 Type 2 diabetes mellitus with foot ulcer: Secondary | ICD-10-CM | POA: Diagnosis not present

## 2016-10-12 NOTE — Progress Notes (Addendum)
[name], [name] ([id number]) Visit Report for 10/10/2016 Chief Complaint Document Details Patient Name: [name], [name] Date of Service: 10/10/2016 8:45 AM Medical Record Number: [id number] Patient Account Number: [id number] Date of Birth/Sex: 1945/11/29 19(70 y.o. Male) Treating RN: Phillis HaggisPinkerton, Debi Primary Care Physician: Oretha MilchSMITH, [name] Other Clinician: Referring Physician: Oretha MilchSMITH, [name] Treating Physician/Extender: Kathreen Cosieroulter, Jefferie Holston Weeks in Treatment: 40 Information Obtained from: Patient Chief Complaint [name] presents for follow-up on his right heel pressure ulcer and bilateral lower extremity lymphedema and venous ulcerations. Electronic Signature(s) Signed: 10/10/2016 9:45:41 AM By: Penne Lashoulter, NP, Vertie Dibbern Entered By: Penne Lashoulter, NP, Nafisa Olds on 10/10/2016 09:45:41 [name], [name] ([id number]) -------------------------------------------------------------------------------- Debridement Details Patient Name: [name], [name] Date of Service: 10/10/2016 8:45 AM Medical Record Number: [id number] Patient Account Number: [id number] Date of Birth/Sex: 1945/11/29 61(70 y.o. Male) Treating RN: Phillis HaggisPinkerton, Debi Primary Care Physician: Oretha MilchSMITH, [name] Other Clinician: Referring Physician: Oretha MilchSMITH, [name] Treating Physician/Extender: Kathreen Cosieroulter, Sharrod Achille Weeks in Treatment: 40 Debridement Performed for Wound #3 Right Calcaneus Assessment: Performed By: Physician Bonnell Publicoulter, Tiera Mensinger, NP Debridement: Open Wound/Selective Debridement Selective Description: Pre-procedure Yes - 09:17 Verification/Time Out Taken: Start Time: 09:18 Pain Control: Lidocaine 4% Topical Solution Total Area Debrided (L x 0.5 (cm) x 1.5 (cm) = 0.75 (cm) W): Tissue and other Non-Viable, Exudate, Fibrin/Slough material debrided: Instrument: Curette Bleeding: Moderate Hemostasis Achieved: Pressure End Time: 09:20 Procedural Pain: 0 Post Procedural Pain: 0 Response to Treatment: Procedure was tolerated well Post Debridement Measurements of  Total Wound Length: (cm) 0.5 Stage: Category/Stage III Width: (cm) 1.5 Depth: (cm) 0.2 Volume: (cm) 0.118 Character of Wound/Ulcer Post Requires Further Debridement: Debridement Severity of Tissue Post Fat layer exposed Debridement: Post Procedure Diagnosis Same as Pre-procedure Electronic Signature(s) Signed: 10/17/2016 11:41:54 AM By: Penne Lashoulter, NP, Katieann Hungate Signed: 10/17/2016 4:04:20 PM By: Evette GeorgesPinkerton, Debra Krabbenhoft, Jennings Seth BakeV. ([id number]) Previous Signature: 10/10/2016 9:45:04 AM Version By: Penne Lashoulter, NP, Prachi Oftedahl Previous Signature: 10/11/2016 8:07:17 AM Version By: Alejandro MullingPinkerton, Debra Entered By: Penne Lashoulter, NP, Jasminne Mealy on 10/17/2016 11:41:54 [name], [name] ([id number]) -------------------------------------------------------------------------------- HPI Details Patient Name: [name], [name] Date of Service: 10/10/2016 8:45 AM Medical Record Number: [id number] Patient Account Number: [id number] Date of Birth/Sex: 1945/11/29 1(70 y.o. Male) Treating RN: Phillis HaggisPinkerton, Debi Primary Care Physician: Oretha MilchSMITH, [name] Other Clinician: Referring Physician: Oretha MilchSMITH, [name] Treating Physician/Extender: Kathreen Cosieroulter, Irlanda Croghan Weeks in Treatment: 40 History of Present Illness Location: ulcerated area on the right heel, left gluteal region and thigh and then bilateral lower extremities Quality: Patient reports experiencing a sharp pain to affected area(s). Severity: Patient states wound are getting worse. Duration: Patient has had the wound for > 12 months prior to seeking treatment at the wound center Timing: Pain in wound is constant (hurts all the time) Context: The wound appeared gradually over time Modifying Factors: Other treatment(s) tried include: he sees his heart doctor and his primary care doctor Associated Signs and Symptoms: patient has not been able to walk for over a year now HPI Description: 70 year old gentleman with a known history of hypertension, diabetes, obstructive sleep apnea, COPD, diastolic CHF,  coronary artery disease was admitted to the hospital with sepsis from an ulcer of the right heel and was treated there in October 2016. he also has chronic bilateral lower extremity edema and lymphedema. He had received vancomycin, Zosyn and at that stage and x-rays showed hardware in the right ankle but no evidence of osteomyelitis. he was a former smoker. he was also treated with Augmentin orally for 10 days. He is either bedbound or wheelchair-bound and does not ambulate by himself. 01/19/2016 -- he has  not been seen here for 3 weeks and this was because he was admitted to [facility] between 223 and 01/08/2016 for sepsis, UTI and pneumonia involving the left lung. he was treated with IV vancomycin and Zosyn and then meropenem. Was discharged home on oral Bactrim for 2 weeks none of his vascular test or x-rays were done and we will reorder these. 01/26/2016 -- he has not yet done the x-ray of his foot and is vascular tests are still pending. I have asked him to work on these with his nursing home staff. Addendum: he has got an x-ray of the right foot done which shows that his osteopenia but no specific ostial lysis or abnormal periosteal reaction. Final impression was degenerative changes with dorsal foot soft tissue swelling. 02/16/2016 -- lower extremity arterial duplex examination shows a 50-99% stenosis of the right tibioperoneal trunk. He had biphasic flow in the right SFA, popliteal and tibioperoneal trunk. Left-sided he had triphasic flow throughout 02/29/2016 -- he is awaiting his vascular opinion with Dr. Gilda Crease which is to be done on April 24 03/08/2016 -- he has not kept his appointment with Dr. Gilda Crease on April 24 and does not seem to know what happened about this. it's difficult to gauge whether he is in full control of his mental faculties as at times he is extremely rude to the nursing staff. 03/22/2016 -- the patient was seen by Dr. 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 Recommended that the patient had severe atherosclerotic changes of both lower extremities associated with ulceration and tissue loss of the foot. This is a limb threatening ischemia and the patient was recommended to undergo Weigold, Marston V. ([id number]) angiography of the lower extremity with a hope for intervention for limb salvage. Patient agreed and will proceed to angiography. He was admitted to the hospital between May 2 and 03/15/2016 - he underwent induction of a catheter into his right lower extremity and third order catheter placement with contrast injection to the right lower extremity for distal runoff. Percutaneous transluminal angioplasty of the right superficial femoral and popliteal arteries were done. He also had a right peroneal angioplasty. Patient had a postoperative hematoma and was admitted for observation and in the next 24 hours he was very agitated and combative and had to be seen by psychiatric. He had a follow-up with Dr. Gilda Crease in 3 weeks. 04/18/2016 -- notes reviewed from the vascular office where he was seen for his postop visit after the PTA of the right SFA and popliteal arteries on 03/12/2016. He underwent an ABI which showed right ABI to be more than 1.3 and left to be more than 1 more than 1.3, great toe and PPG waveforms are decreased bilaterally. No additional intervention indicated at this time and the patient was to follow-up in 3 months with an ABI and bilateral lower extremity duplex study. 05/02/2016 -- he complains that his compression stockings are causing him a lot of discomfort and he is not happy wearing them. 05/30/2016 -- the swelling of both legs has increased again and he has had blisters which have opened out into ulcerations. 09/12/2016 -- the lymphedema pumps are still not delivered and hopefully will be  done within this week. As far as his skin substitute goes we are still awaiting a response from his insurance :10-10-2016: Mr. Divita presents today and states that his left edema pumps have  arrived but the he has only received treatment "5 times" in sterile arrival. He also denies using the offloading boot for the right heel but admits to having the boot. He states that staff is hesitant to assist and application of the lymphedema pumps. Electronic Signature(s) Signed: 10/10/2016 9:47:15 AM By: Penne Lash, NP, Reggie Pile By: Penne Lash, NP, Zeffie Bickert on 10/10/2016 09:47:15 [name] Otto ([id number]) -------------------------------------------------------------------------------- Physical Exam Details Patient Name: [name] Otto. Date of Service: 10/10/2016 8:45 AM Medical Record Number: [id number] Patient Account Number: [id number] Date of Birth/Sex: Jul 21, 1946 (70 y.o. Male) Treating RN: Phillis Haggis Primary Care Physician: Oretha Milch Other Clinician: Referring Physician: Oretha Milch Treating Physician/Extender: Bonnell Public Weeks in Treatment: 40 Constitutional BP within normal limits. afebrile. well nourished; obese; appears stated age;Marland Kitchen Respiratory non-labored respiratory effort. Cardiovascular non-palpable DP and PT bilaterally, all audible with Doppler and appears multiphasic. hemosiderin staining, edema bilaterally. Musculoskeletal presents in wheelchair, able to transfer self to chair. Integumentary (Hair, Skin) RLE-diffuse areas of linear blisters consistent with loosening of compression wraps otherwise no open areas, no erythema, no signs or symptoms of cellulitis; right heel-2 granular buds 1 hypergranulated with small area of slough, no peri-ulcer erythema induration or fluctuance; LLE-scattered areas of superficial opening with an increase in drainage (probably secondary to lack of lymphedema pump use), no erythema, no malodor. no induration, no fluctuance, admits  to pain with palpation to right heel, very sensitive with any tactile stimulus to bilateral feet. Psychiatric oriented to time, place, person and situation. calm, pleasant, conversive. Electronic Signature(s) Signed: 10/10/2016 9:54:19 AM By: Penne Lash, NP, Reggie Pile By: Penne Lash, NP, Maeson Lourenco on 10/10/2016 09:54:18 [name] Otto ([id number]) -------------------------------------------------------------------------------- Physician Orders Details Patient Name: [name] Otto Date of Service: 10/10/2016 8:45 AM Medical Record Number: [id number] Patient Account Number: [id number] Date of Birth/Sex: 12/13/1945 (70 y.o. Male) Treating RN: Phillis Haggis Primary Care Physician: Oretha Milch Other Clinician: Referring Physician: Oretha Milch Treating Physician/Extender: Kathreen Cosier in Treatment: 79 Verbal / Phone Orders: Yes Clinician: Ashok Cordia, Debi Read Back and Verified: Yes Diagnosis Coding Wound Cleansing Wound #10 Right,Anterior Lower Leg o No tub bath. - only sink bath or bed bath Wound #3 Right Calcaneus o No tub bath. - only sink bath or bed bath Wound #9 Left,Anterior Lower Leg o No tub bath. - only sink bath or bed bath Anesthetic Wound #10 Right,Anterior Lower Leg o Topical Lidocaine 4% cream applied to wound bed prior to debridement - for clinic use only Wound #3 Right Calcaneus o Topical Lidocaine 4% cream applied to wound bed prior to debridement - for clinic use only Wound #9 Left,Anterior Lower Leg o Topical Lidocaine 4% cream applied to wound bed prior to debridement - for clinic use only Skin Barriers/Peri-Wound Care Wound #3 Right Calcaneus o Barrier cream - around the heel wound to reddened and irritated areas Primary Wound Dressing Wound #10 Right,Anterior Lower Leg o Aquacel Ag Wound #3 Right Calcaneus o Hydrafera Blue Wound #9 Left,Anterior Lower Leg o Aquacel Ag Secondary Dressing Wound #10 Right,Anterior Lower  Leg Wandler, Dominyck V. ([id number]) o ABD pad o XtraSorb Wound #3 Right Calcaneus o ABD pad Wound #9 Left,Anterior Lower Leg o ABD pad o XtraSorb Dressing Change Frequency Wound #10 Right,Anterior Lower Leg o Change dressing every week Wound #3 Right Calcaneus o Change dressing every week Wound #9 Left,Anterior Lower Leg o Change dressing every week Follow-up Appointments o Return Appointment in 1 week. Edema Control o 3 Layer Compression System - Bilateral o Other: - unna to anchor  Off-Loading Wound #3 Right Calcaneus o Turn and reposition every 2 hours o Other: - sage boots at night and float heel when lying in bed *******lymphedema pumps to be placed on pt twice a day for one hour sessions******** Additional Orders / Instructions Wound #3 Right Calcaneus o Increase protein intake. Medications-please add to medication list. Wound #3 Right Calcaneus o Other: - Vitamin C, Vitamin A, Zinc, multivitamin Electronic Signature(s) Signed: 10/11/2016 8:07:17 AM By: Alejandro MullingPinkerton, Debra Signed: 10/11/2016 8:33:28 AM By: Penne Lashoulter, NP, Adhvik Canady Entered By: Alejandro MullingPinkerton, Debra on 10/10/2016 09:26:05 Gail, Demetrie V. ([id number]) [name], Stephano VMarland Kitchen. ([id number]) -------------------------------------------------------------------------------- Problem List Details Patient Name: [name], [name] Date of Service: 10/10/2016 8:45 AM Medical Record Number: [id number] Patient Account Number: [id number] Date of Birth/Sex: 04-Feb-1946 [age] y.o. Male) Treating RN: Phillis HaggisPinkerton, Debi Primary Care Physician: Oretha MilchSMITH, [name] Other Clinician: Referring Physician: Oretha MilchSMITH, [name] Treating Physician/Extender: Kathreen Cosieroulter, Yizel Canby Weeks in Treatment: 8140 Active Problems ICD-10 Encounter Code Description Active Date Diagnosis E11.621 Type 2 diabetes mellitus with foot ulcer 01/01/2016 Yes L89.613 Pressure ulcer of right heel, stage 3 01/01/2016 Yes I89.0 Lymphedema, not elsewhere classified 01/01/2016  Yes E66.01 Morbid (severe) obesity due to excess calories 01/01/2016 Yes M70.871 Other soft tissue disorders related to use, overuse and 01/19/2016 Yes pressure, right ankle and foot 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/16/2016 Yes ulceration of heel and midfoot Inactive Problems Resolved Problems ICD-10 Code Description Active Date Resolved Date L89.322 Pressure ulcer of left buttock, stage 2 01/01/2016 01/01/2016 Electronic Signature(s) Signed: 10/10/2016 9:44:40 AM By: Penne Lashoulter, NP, Emanuele Mcwhirter Entered By: Penne Lashoulter, NP, Asuzena Weis on 10/10/2016 09:44:40 [name], [name] ([id number]) [name], Jonael VMarland Kitchen. ([id number]) -------------------------------------------------------------------------------- Progress Note Details Patient Name: [name], Ogle V. Date of Service: 10/10/2016 8:45 AM Medical Record Number: [id number] Patient Account Number: [id number] Date of Birth/Sex: 04-Feb-1946 [age] y.o. Male) Treating RN: Phillis HaggisPinkerton, Debi Primary Care Physician: Oretha MilchSMITH, [name] Other Clinician: Referring Physician: Oretha MilchSMITH, [name] Treating Physician/Extender: Kathreen Cosieroulter, Echo Allsbrook Weeks in Treatment: 40 Subjective Chief Complaint Information obtained from Patient [name] presents for follow-up on his right heel pressure ulcer and bilateral lower extremity lymphedema and venous ulcerations. History of Present Illness (HPI) The following HPI elements were documented for the patient's wound: Location: ulcerated area on the right heel, left gluteal region and thigh and then bilateral lower extremities Quality: Patient reports experiencing a sharp pain to affected area(s). Severity: Patient states wound are getting worse. Duration: Patient has had the wound for > 12 months prior to seeking treatment at the wound center Timing: Pain in wound is constant (hurts all the time) Context: The wound appeared gradually over time Modifying Factors: Other treatment(s) tried include: he sees his heart doctor and his  primary care doctor Associated Signs and Symptoms: patient has not been able to walk for over a year now 70 year old gentleman with a known history of hypertension, diabetes, obstructive sleep apnea, COPD, diastolic CHF, coronary artery disease was admitted to the hospital with sepsis from an ulcer of the right heel and was treated there in October 2016. he also has chronic bilateral lower extremity edema and lymphedema. He had received vancomycin, Zosyn and at that stage and x-rays showed hardware in the right ankle but no evidence of osteomyelitis. he was a former smoker. he was also treated with Augmentin orally for 10 days. He is either bedbound or wheelchair-bound and does not ambulate by himself. 01/19/2016 -- he has not been seen here for 3 weeks and this was because [facility] and 01/08/2016 for sepsis, UTI and pneumonia involving the left lung. he was treated with IV vancomycin and Zosyn and then meropenem. Was discharged home on oral Bactrim for 2 weeks none of his vascular test or x-rays were done and we will reorder these. 01/26/2016 -- he has not yet done the x-ray of his foot and is vascular tests are still pending. I have asked him to work on these with his nursing home staff. Addendum: he has got an x-ray of the right foot done which shows that his osteopenia but no specific ostial lysis or abnormal periosteal reaction. Final impression was degenerative changes with dorsal foot soft tissue swelling. 02/16/2016 -- lower extremity arterial duplex examination shows a 50-99% stenosis of the right tibioperoneal trunk. He had biphasic flow in the right SFA, popliteal and tibioperoneal trunk. Left-sided he had triphasic flow throughout DEMETRIE, BORGE ([id number]) 02/29/2016 -- he is awaiting his vascular opinion with Dr. Gilda Crease which is to be done on April 24 03/08/2016 -- he has not kept his appointment with Dr. Gilda Crease on  April 24 and does not seem to know what happened about this. it's difficult to gauge whether he is in full control of his mental faculties as at times he is extremely rude to the nursing staff. 03/22/2016 -- the patient was seen by Dr. 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 Recommended that the patient had severe atherosclerotic changes of both lower extremities associated with ulceration and tissue loss of the foot. This is a limb threatening ischemia and the patient was recommended to undergo angiography of the lower extremity with a hope for intervention for limb salvage. Patient agreed and will proceed to angiography. He was admitted to the hospital between May 2 and 03/15/2016 - he underwent induction of a catheter into his right lower extremity and third order catheter placement with contrast injection to the right lower extremity for distal runoff. Percutaneous transluminal angioplasty of the right superficial femoral and popliteal arteries were done. He also had a right peroneal angioplasty. Patient had a postoperative hematoma and was admitted for observation and in the next 24 hours he was very agitated and combative and had to be seen by psychiatric. He had a follow-up with Dr. Gilda Crease in 3 weeks. 04/18/2016 -- notes reviewed from the vascular office where he was seen for his postop visit after the PTA of the right SFA and popliteal arteries on 03/12/2016. He underwent an ABI which showed right ABI to be more than 1.3 and left to be more than 1 more than 1.3, great toe and PPG waveforms are decreased bilaterally. No additional intervention indicated at this time and the patient was to follow-up in 3 months with an ABI and bilateral lower extremity duplex study. 05/02/2016 -- he complains that his compression stockings are causing him a lot of discomfort and he is  not happy wearing them. 05/30/2016 -- the swelling of both legs has increased again and he has had blisters which have opened out into ulcerations. 09/12/2016 -- the lymphedema pumps are still not delivered and hopefully will be done within this week. As far as his skin substitute goes we are still awaiting a response from his insurance :10-10-2016: Mr. Luger presents today and states that his left edema pumps have arrived but the he has only received treatment "5 times" in sterile arrival. He also denies using the offloading boot  for the right heel but admits to having the boot. He states that staff is hesitant to assist and application of the lymphedema pumps. Objective Constitutional BP within normal limits. afebrile. well nourished; obese; appears stated age;Marland Kitchen Vitals Time Taken: 8:42 AM, Height: 70 in, Weight: 235 lbs, BMI: 33.7, Temperature: 97.7 F, Pulse: 61 bpm, Respiratory Rate: 16 breaths/min, Blood Pressure: 119/70 mmHg. Mikrut, Eri V. ([id number]) Respiratory non-labored respiratory effort. Cardiovascular non-palpable DP and PT bilaterally, all audible with Doppler and appears multiphasic. hemosiderin staining, edema bilaterally. Musculoskeletal presents in wheelchair, able to transfer self to chair. Psychiatric oriented to time, place, person and situation. calm, pleasant, conversive. Integumentary (Hair, Skin) RLE-diffuse areas of linear blisters consistent with loosening of compression wraps otherwise no open areas, no erythema, no signs or symptoms of cellulitis; right heel-2 granular buds 1 hypergranulated with small area of slough, no peri-ulcer erythema induration or fluctuance; LLE-scattered areas of superficial opening with an increase in drainage (probably secondary to lack of lymphedema pump use), no erythema, no malodor. no induration, no fluctuance, admits to pain with palpation to right heel, very sensitive with any tactile stimulus to bilateral  feet. Wound #10 status is Open. Original cause of wound was Blister. The wound is located on the Right,Anterior Lower Leg. The wound measures 3.5cm length x 1.5cm width x 0.1cm depth; 4.123cm^2 area and 0.412cm^3 volume. The wound is limited to skin breakdown. There is no tunneling or undermining noted. There is a large amount of serosanguineous drainage noted. The wound margin is distinct with the outline attached to the wound base. There is large (67-100%) pink, pale granulation within the wound bed. There is a small (1-33%) amount of necrotic tissue within the wound bed including Adherent Slough. The periwound skin appearance exhibited: Localized Edema, Moist, Hemosiderin Staining, Mottled. The periwound skin appearance did not exhibit: Callus, Crepitus, Excoriation, Fluctuance, Friable, Induration, Rash, Scarring, Dry/Scaly, Maceration, Atrophie Blanche, Cyanosis, Ecchymosis, Pallor, Rubor, Erythema. Periwound temperature was noted as No Abnormality. The periwound has tenderness on palpation. Wound #3 status is Open. Original cause of wound was Pressure Injury. The wound is located on the Right Calcaneus. The wound measures 0.5cm length x 1.5cm width x 0.2cm depth; 0.589cm^2 area and 0.118cm^3 volume. The wound is limited to skin breakdown. There is no tunneling or undermining noted. There is a large amount of serous drainage noted. The wound margin is distinct with the outline attached to the wound base. There is large (67-100%) pink, pale granulation within the wound bed. There is a small (1-33%) amount of necrotic tissue within the wound bed including Adherent Slough. The periwound skin appearance exhibited: Localized Edema, Moist, Hemosiderin Staining, Mottled. The periwound skin appearance did not exhibit: Callus, Crepitus, Excoriation, Fluctuance, Friable, Induration, Rash, Scarring, Dry/Scaly, Maceration, Atrophie Blanche, Cyanosis, Ecchymosis, Pallor, Rubor, Erythema.  Periwound temperature was noted as No Abnormality. Wound #9 status is Open. Original cause of wound was Gradually Appeared. The wound is located on the Left,Anterior Lower Leg. The wound measures 17cm length x 14.5cm width x 0.1cm depth; 193.601cm^2 area and 19.36cm^3 volume. The wound is limited to skin breakdown. There is no tunneling or undermining noted. There is a large amount of serosanguineous drainage noted. The wound margin is distinct with the outline attached to the wound base. There is large (67-100%) red granulation within the wound bed. There Mcglaun, Tramaine V. ([id number]) is a small (1-33%) amount of necrotic tissue within the wound bed including Adherent Slough. The periwound skin appearance exhibited: Localized Edema, Moist, Hemosiderin Staining, Mottled.  The periwound skin appearance did not exhibit: Callus, Crepitus, Excoriation, Fluctuance, Friable, Induration, Rash, Scarring, Dry/Scaly, Maceration, Atrophie Blanche, Cyanosis, Ecchymosis, Pallor, Rubor, Erythema. Periwound temperature was noted as No Abnormality. The periwound has tenderness on palpation. 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 There was an Open Wound debridement with total area of 0.75 sq cm performed by Bonnell Public, NP. with the following instrument(s): Curette to remove Non-Viable tissue/material including Exudate and Fibrin/Slough after achieving pain control using Lidocaine 4% Topical Solution. A time out was conducted at 09:17, prior to  the start of the procedure. A Moderate amount of bleeding was controlled with Pressure. The procedure was tolerated well with a pain level of 0 throughout and a pain level of 0 following the procedure. Post Debridement Measurements: 0.5cm length x 1.5cm width x 0.2cm depth; 0.118cm^3 volume. Post debridement Stage noted as Category/Stage III. Character of Wound/Ulcer Post Debridement requires further debridement. Severity of Tissue Post Debridement is: Fat layer exposed. Post procedure Diagnosis Wound #3: Same as Pre-Procedure Plan Wound Cleansing: Roseland, Coleby V. ([id number]) Wound #10 Right,Anterior Lower Leg: No tub bath. - only sink bath or bed bath Wound #3 Right Calcaneus: No tub bath. - only sink bath or bed bath Wound #9 Left,Anterior Lower Leg: No tub bath. - only sink bath or bed bath Anesthetic: Wound #10 Right,Anterior Lower Leg: Topical Lidocaine 4% cream applied to wound bed prior to debridement - for clinic use only Wound #3 Right Calcaneus: Topical Lidocaine 4% cream applied to wound bed prior to debridement - for clinic use only Wound #9 Left,Anterior Lower Leg: Topical Lidocaine 4% cream applied to wound bed prior to debridement - for clinic use only Skin Barriers/Peri-Wound Care: Wound #3 Right Calcaneus: Barrier cream - around the heel wound to reddened and irritated areas Primary Wound Dressing: Wound #10 Right,Anterior Lower Leg: Aquacel Ag Wound #3 Right Calcaneus: Hydrafera Blue Wound #9 Left,Anterior Lower Leg: Aquacel Ag Secondary Dressing: Wound #10 Right,Anterior Lower Leg: ABD pad XtraSorb Wound #3 Right Calcaneus: ABD pad Wound #9 Left,Anterior Lower Leg: ABD pad XtraSorb Dressing Change Frequency: Wound #10 Right,Anterior Lower Leg: Change dressing every week Wound #3 Right Calcaneus: Change dressing every week Wound #9 Left,Anterior Lower Leg: Change dressing every week Follow-up Appointments: Return Appointment in 1 week. Edema  Control: 3 Layer Compression System - Bilateral Other: - unna to anchor Off-Loading: Wound #3 Right Calcaneus: Turn and reposition every 2 hours Other: - sage boots at night and float heel when lying in bed *******lymphedema pumps to be placed on pt twice a day for one hour sessions******** Additional Orders / Instructions: Wound #3 Right Calcaneus: Stanis, Philbert V. ([id number]) Increase protein intake. Medications-please add to medication list.: Wound #3 Right Calcaneus: Other: - Vitamin C, Vitamin A, Zinc, multivitamin Follow-Up Appointments: A follow-up appointment should be scheduled. Medication Reconciliation completed and provided to Patient/Care Provider. A Patient Clinical Summary of Care was provided to EL 1. Continue with Hydrofera Blue to right heel 2. Continue Aquacel Ag to LLE 3. Continue with compression to RLE 4. Offloading boot to  right foot when in bed 5. Lymphedema pumps twice daily, 60 minute per session 6. Follow-up next week Electronic Signature(s) Signed: 10/17/2016 11:45:34 AM By: Penne Lash, NP, Garrie Woodin Previous Signature: 10/11/2016 8:30:32 PM Version By: Penne Lash, NP, Maxine Huynh Previous Signature: 10/10/2016 9:57:03 AM Version By: Penne Lash, NP, Marquise Lambson Entered By: Penne Lash, NP, Amr Sturtevant on 10/17/2016 11:45:34 [name] Otto ([id number]) -------------------------------------------------------------------------------- SuperBill Details Patient Name: [name] Otto. Date of Service: 10/10/2016 Medical Record Number: [id number] Patient Account Number: [id number] Date of Birth/Sex: [birth year] (70 y.o. 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 Facility Procedures CPT4 Code: [id number] Description: 97597 - DEBRIDE WOUND 1ST 20 SQ CM OR < ICD-10 Description Diagnosis E11.621 Type 2 diabetes mellitus with foot ulcer L89.613 Pressure ulcer of right heel, stage 3 Modifier: Quantity: 1 Physician Procedures CPT4 Code: [id number] Description: 97597 - WC PHYS DEBR WO ANESTH 20 SQ CM ICD-10 Description Diagnosis E11.621 Type 2 diabetes mellitus with foot ulcer L89.613 Pressure ulcer of right heel, stage 3 Modifier: Quantity: 1 Electronic Signature(s) Signed: 10/17/2016 11:46:13 AM By: Penne Lash, NP, Humbert Morozov Previous Signature: 10/10/2016 9:57:25 AM Version By: Penne Lash, NP, Tennis Mckinnon Entered By: Penne Lash, NP, Brenten Janney on 10/17/2016 11:46:11

## 2016-10-12 NOTE — Progress Notes (Signed)
BRADFORD, CAZIER ([id number]) Visit Report for 10/10/2016 Arrival Information Details Patient Name: [name], [name] Date of Service: 10/10/2016 8:45 AM Medical Record Number: [id number] Patient Account Number: [id number] Date of Birth/Sex: 11/20/1945 (70 y.o. Male) Treating RN: [name] Primary Care Physician: Oretha Milch Other Clinician: Referring Physician: Oretha Milch Treating Physician/Extender: Rudene Re in Treatment: 40 Visit Information History Since Last Visit All ordered tests and consults were completed: No Patient Arrived: Wheel Chair Added or deleted any medications: No Arrival Time: 08:42 Any new allergies or adverse reactions: No Accompanied By: self Had a fall or experienced change in No Transfer Assistance: Easy[employer] activities of daily living that may affect Patient Lift risk of falls: Patient Identification Verified: Yes Signs or symptoms of abuse/neglect since last No Secondary Verification Process Yes visito Completed: Hospitalized since last visit: No Patient Requires Transmission- No Pain Present Now: No Based Precautions: Patient Has Alerts: Yes Patient Alerts: DM II Electronic Signature(s) Signed: 10/11/2016 8:07:17 AM By: Alejandro Mulling Entered By: Alejandro Mulling on 10/10/2016 08:42:46 Seals, [name] ([id number]) -------------------------------------------------------------------------------- Encounter Discharge Information Details Patient Name: [name] Date of Service: 10/10/2016 8:45 AM Medical Record Number: [id number] Patient Account Number: [id number] Date of Birth/Sex: [birth year] (70 y.o. Male) Treating RN: [name] Primary Care Physician: Oretha Milch Other Clinician: Referring Physician: Oretha Milch Treating Physician/Extender: Kathreen Cosier in Treatment: 48 Encounter Discharge Information Items Discharge Pain Level: 0 Discharge Condition: Stable Ambulatory Status: Wheelchair Discharge  Destination: Nursing Home Transportation: Other Accompanied By: self Schedule Follow-up Appointment: Yes Medication Reconciliation completed and provided to Patient/Care Yes [name]: Provided on Clinical Summary of Care: 10/10/2016 Form Type Recipient Paper Patient EL Electronic Signature(s) Signed: 10/11/2016 8:07:17 AM By: Alejandro Mulling Previous Signature: 10/10/2016 9:49:58 AM Version By: Gwenlyn Perking Entered By: Alejandro Mulling on 10/10/2016 11:27:35 [name], [name] ([id number]) -------------------------------------------------------------------------------- Lower Extremity Assessment Details Patient Name: [name] Date of Service: 10/10/2016 8:45 AM Medical Record Number: [id number] Patient Account Number: [id number] Date of Birth/Sex: [birth year] (70 y.o. Male) Treating RN: [name] Primary Care Physician: Oretha Milch Other Clinician: Referring Physician: Oretha Milch Treating Physician/Extender: Rudene Re in Treatment: 40 Edema Assessment Assessed: [Left: No] [Right: No] E[Left: dema] [Right: :] Calf Left: Right: Point of Measurement: 34 cm From Medial Instep 39.7 cm 38.5 cm Ankle Left: Right: Point of Measurement: 12 cm From Medial Instep 26.6 cm 25.2 cm Vascular Assessment Pulses: Posterior Tibial Dorsalis Pedis Palpable: [Left:Yes] [Right:Yes] Extremity colors, hair growth, and conditions: Extremity Color: [Left:Hyperpigmented] [Right:Hyperpigmented] Temperature of Extremity: [Left:Warm] [Right:Warm] Capillary Refill: [Left:< 3 seconds] [Right:< 3 seconds] Toe Nail Assessment Left: Right: Thick: Yes Yes Discolored: Yes Yes Deformed: No No Improper Length and Hygiene: Yes Yes Electronic Signature(s) Signed: 10/11/2016 8:07:17 AM By: Alejandro Mulling Entered By: Alejandro Mulling on 10/10/2016 08:53:14 [name], [name] ([id number]) -------------------------------------------------------------------------------- Multi Wound  Chart Details Patient Name: [name] Date of Service: 10/10/2016 8:45 AM Medical Record Number: [id number] Patient Account Number: [id number] Date of Birth/Sex: 28-Dec-1945 (70 y.o. Male) Treating RN: [name] Primary Care Physician: Oretha Milch Other Clinician: Referring Physician: Oretha Milch Treating Physician/Extender: Kathreen Cosier in Treatment: 40 Vital Signs Height(in): 70 Pulse(bpm): 61 Weight(lbs): 235 Blood Pressure 119/70 (mmHg): Body Mass Index(BMI): 34 Temperature(F): 97.7 Respiratory Rate 16 (breaths/min): Photos: [10:No Photos] [3:No Photos] [9:No Photos] Wound Location: [10:Right Lower Leg - Anterior Right Calcaneus] [9:Left Lower Leg - Anterior] Wounding Event: [10:Blister] [3:Pressure Injury] [9:Gradually Appeared] Primary Etiology: [10:Lymphedema] [3:Pressure Ulcer] [9:Diabetic Wound/Ulcer of the Lower Extremity] Secondary Etiology: [  10: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] [9:Venous Leg Ulcer] Comorbid History: [10:Cataracts, Chronic Obstructive Pulmonary Disease (COPD), Sleep Disease (COPD), Sleep Disease (COPD), Sleep Apnea, Angina, Congestive Heart Failure, Congestive Heart Failure, Congestive Heart Failure, Hypertension, Type II Diabetes,  Gout, Osteoarthritis, Neuropathy Osteoarthritis, Neuropathy Osteoarthritis, Neuropathy] [3:Cataracts, Chronic Obstructive Pulmonary Apnea, Angina, Hypertension, Type II Diabetes, Gout,] [9:Cataracts, Chronic Obstructive Pulmonary Apnea, Angina,  Hypertension, Type II Diabetes, Gout,] Date Acquired: [10:08/22/2016] [3:10/31/2015] [9:08/09/2016] Weeks of Treatment: [10:7] [3:40] [9:8] Wound Status: [10:Open] [3:Open] [9:Open] Clustered Wound: [10:No] [3:No] [9:Yes] Measurements L x W x D 3.5x1.5x0.1 [3:0.5x1.5x0.2] [9:17x14.5x0.1] (cm) Area (cm) : [10:4.123] [3:0.589] [9:193.601] Volume (cm) : [10:0.412] [3:0.118] [9:19.36] % Reduction in Area: [10:-250.00%] [3:91.70%] [9:-93.30%] %  Reduction in Volume: -249.20% [3:91.70%] [9:-93.30%] Classification: [10:Partial Thickness] [3:Category/Stage III] [9:Grade 2] HBO Classification: [10:Grade 1] [3:Grade 1] [9:N/A] Exudate Amount: [10:Large] [3:Large] [9:Large] Exudate Type: [10:Serosanguineous] [3:Serous] [9:Serosanguineous] Exudate Color: [10:red, brown] [3:amber] [9:red, brown] Wound Margin: Distinct, outline attached Distinct, outline attached Distinct, outline attached Granulation Amount: Large (67-100%) Large (67-100%) Large (67-100%) Granulation Quality: Pink, Pale Pink, Pale Red Necrotic Amount: Small (1-33%) Small (1-33%) Small (1-33%) Exposed Structures: Fascia: No Fascia: No Fascia: No Fat: No Fat: No Fat: No Tendon: No Tendon: No Tendon: No Muscle: No Muscle: No Muscle: No Joint: No Joint: No Joint: No Bone: No Bone: No Bone: No Limited to Skin Limited to Skin Limited to Skin Breakdown Breakdown Breakdown Epithelialization: None None None Debridement: N/A Open Wound/Selective N/A ([id number]) - Selective Pre-procedure N/A 09:17 N/A Verification/Time Out Taken: Pain Control: N/A Lidocaine 4% Topical N/A Solution Tissue Debrided: N/A Fibrin/Slough, Exudates, N/A Subcutaneous Debridement Area (sq N/A 0.75 N/A cm): Instrument: N/A Curette N/A Bleeding: N/A Moderate N/A Hemostasis Achieved: N/A Pressure N/A Procedural Pain: N/A 0 N/A Post Procedural Pain: N/A 0 N/A Debridement Treatment N/A Procedure was tolerated N/A Response: well Post Debridement N/A 0.5x1.5x0.2 N/A Measurements L x W x D (cm) Post Debridement N/A 0.118 N/A Volume: (cm) Post Debridement N/A Category/Stage III N/A Stage: Periwound Skin Texture: Edema: Yes Edema: Yes Edema: Yes Excoriation: No Excoriation: No Excoriation: No Induration: No Induration: No Induration: No Callus: No Callus: No Callus: No Crepitus: No Crepitus: No Crepitus: No Fluctuance: No Fluctuance: No Fluctuance: No Friable:  No Friable: No Friable: No Rash: No Rash: No Rash: No Scarring: No Scarring: No Scarring: No Periwound Skin Moist: Yes Moist: Yes Moist: Yes Moisture: Maceration: No Maceration: No Maceration: No Dry/Scaly: No Dry/Scaly: No Dry/Scaly: No [name], [name] ([id number]) Periwound Skin Color: Hemosiderin Staining: Yes Hemosiderin Staining: Yes Hemosiderin Staining: Yes Mottled: Yes Mottled: Yes Mottled: Yes Atrophie Blanche: No Atrophie Blanche: No Atrophie Blanche: No Cyanosis: No Cyanosis: No Cyanosis: No Ecchymosis: No Ecchymosis: No Ecchymosis: No Erythema: No Erythema: No Erythema: No Pallor: No Pallor: No Pallor: No Rubor: No Rubor: No Rubor: No Temperature: No Abnormality No Abnormality No Abnormality Tenderness on Yes No Yes Palpation: Wound Preparation: Ulcer Cleansing: Other: Ulcer Cleansing: Other: Ulcer Cleansing: Other: surg scrub and water surg scrub and water surg scrub and water Topical Anesthetic Topical Anesthetic Topical Anesthetic Applied: None Applied: Other: lidocaine Applied: None 4% Procedures Performed: N/A Debridement N/A Treatment Notes Wound #10 (Right, Anterior Lower Leg) 1. Cleansed with: Clean wound with Normal Saline Cleanse wound with antibacterial soap and water 4. Dressing Applied: Aquacel Ag 5. Secondary Dressing Applied ABD Pad 7. Secured with Tape 3 Layer Compression System - Bilateral Notes unna to anchor, xtrasorb Wound #3 (Right Calcaneus) 1. Cleansed with: Clean wound with Normal Saline  2. Anesthetic Topical Lidocaine 4% cream to wound bed prior to debridement 3. Peri-wound Care: Ointment 4. Dressing Applied: Hydrafera Blue 5. Secondary Dressing Applied ABD Pad 7. Secured with Tape [name], [name] ([id number]) 3 Layer Compression System - Bilateral Wound #9 (Left, Anterior Lower Leg) 1. Cleansed with: Clean wound with Normal Saline Cleanse wound with antibacterial soap and water 4. Dressing  Applied: Aquacel Ag 5. Secondary Dressing Applied ABD Pad 7. Secured with Tape 3 Layer Compression System - Bilateral Notes unna to anchor, [employer] forces operational officer) Signed: 10/11/2016 8:07:17 AM By: Alejandro Mulling Entered By: Alejandro Mulling on 10/10/2016 11:27:02 [name] ([id number]) -------------------------------------------------------------------------------- Multi-Disciplinary Care Plan Details Patient Name: [name] Date of Service: 10/10/2016 8:45 AM Medical Record Number: [id number] Patient Account Number: [id number] Date of Birth/Sex: July 29, 1946 (70 y.o. Male) Treating RN: [name] Primary Care Physician: Oretha Milch Other Clinician: Referring Physician: Oretha Milch Treating Physician/Extender: Kathreen Cosier in Treatment: 57 Active Inactive Abuse / Safety / Falls / Self Care Management Nursing Diagnoses: Potential for falls Goals: Patient will remain injury free Date Initiated: 03/01/2016 Goal Status: Active Interventions: Assess fall risk on admission and as needed Notes: Nutrition Nursing Diagnoses: Imbalanced nutrition Goals: Patient/caregiver agrees to and verbalizes understanding of need to use nutritional supplements and/or vitamins as prescribed Date Initiated: 03/01/2016 Goal Status: Active Interventions: Assess patient nutrition upon admission and as needed per policy Notes: Orientation to the Wound Care Program Nursing Diagnoses: Knowledge deficit related to the wound healing center program Goals: Patient/caregiver will verbalize understanding of the [facility] [address] TARL, CEPHAS ([id number]) Date Initiated: 03/01/2016 Goal Status: Active Interventions: Provide education on orientation to the wound center Notes: Pain, Acute or Chronic Nursing Diagnoses: Pain, acute or chronic: actual or potential Potential alteration in comfort, pain Goals: Patient will verbalize adequate pain  control and receive pain control interventions during procedures as needed Date Initiated: 03/01/2016 Goal Status: Active Patient/caregiver will verbalize adequate pain control between visits Date Initiated: 03/01/2016 Goal Status: Active Interventions: Assess comfort goal upon admission Complete pain assessment as per visit requirements Notes: Pressure Nursing Diagnoses: Knowledge deficit related to causes and risk factors for pressure ulcer development Knowledge deficit related to management of pressures ulcers Goals: Patient/caregiver will verbalize risk factors for pressure ulcer development Date Initiated: 03/01/2016 Goal Status: Active Interventions: Assess offloading mechanisms upon admission and as needed Notes: Wound/Skin Impairment Nursing Diagnoses: [name], [name] ([id number]) Impaired tissue integrity Goals: Ulcer/skin breakdown will have a volume reduction of 30% by week 4 Date Initiated: 03/01/2016 Goal Status: Active Ulcer/skin breakdown will have a volume reduction of 50% by week 8 Date Initiated: 03/01/2016 Goal Status: Active Ulcer/skin breakdown will have a volume reduction of 80% by week 12 Date Initiated: 03/01/2016 Goal Status: Active Interventions: Assess ulceration(s) every visit Notes: Electronic Signature(s) Signed: 10/11/2016 8:07:17 AM By: Alejandro Mulling Entered By: Alejandro Mulling on 10/10/2016 11:26:51 [name], [name] Kitchen ([id number]) -------------------------------------------------------------------------------- Pain Assessment Details Patient Name: [name] Date of Service: 10/10/2016 8:45 AM Medical Record Number: [id number] Patient Account Number: [id number] Date of Birth/Sex: [birth year] (70 y.o. Male) Treating RN: [name] Primary Care Physician: Oretha Milch Other Clinician: Referring Physician: Oretha Milch Treating Physician/Extender: Rudene Re in Treatment: 40 Active Problems Location of Pain Severity and  Description of Pain Patient Has Paino No Site Locations With Dressing Change: No Pain Management and Medication Current Pain Management: Electronic Signature(s) Signed: 10/11/2016 8:07:17 AM By: Alejandro Mulling Entered By: Alejandro Mulling on 10/10/2016 08:42:51 [name], [name] ([id number]) --------------------------------------------------------------------------------  Patient/Caregiver Education Details Patient Name: [name] OttoLINDLEY, Brody V. Date of Service: 10/10/2016 8:45 AM Medical Record Number: [id number] Patient Account Number: [id number] Date of Birth/Gender: Feb 21, 1946 [age] y.o. Male) Treating RN: [name] HaggisPinkerton, Debi Primary Care Physician: Oretha MilchSMITH, [name] Other Clinician: Referring Physician: Oretha MilchSMITH, [name] Treating Physician/Extender: Kathreen Cosieroulter, Leah Weeks in Treatment: 8240 Education Assessment Education Provided To: Patient Education Topics Provided Wound/Skin Impairment: Handouts: Other: do not get wraps wet Methods: Demonstration, Explain/Verbal Responses: State content correctly Electronic Signature(s) Signed: 10/11/2016 8:07:17 AM By: Alejandro MullingPinkerton, Debra Entered By: Alejandro MullingPinkerton, Debra on 10/10/2016 11:27:51 [name], [name] Kitchen. ([id number]) -------------------------------------------------------------------------------- Wound Assessment Details Patient Name: [name] OttoLINDLEY, Salvadore V. Date of Service: 10/10/2016 8:45 AM Medical Record Number: [id number] Patient Account Number: [id number] Date of Birth/Sex: Feb 21, 1946 10(70 y.o. Male) Treating RN: [name] HaggisPinkerton, Debi Primary Care Physician: Oretha MilchSMITH, [name] Other Clinician: Referring Physician: Oretha MilchSMITH, [name] Treating Physician/Extender: Bonnell Publicoulter, Leah Weeks in Treatment: 40 Wound Status Wound Number: 10 Primary Lymphedema Etiology: Wound Location: Right Lower Leg - Anterior Secondary Diabetic Wound/Ulcer of the Lower Wounding Event: Blister Etiology: Extremity Date Acquired: 08/22/2016 Wound Open Weeks Of Treatment: 7 Status: Clustered Wound:  No Comorbid Cataracts, Chronic Obstructive History: Pulmonary Disease (COPD), Sleep Apnea, Angina, Congestive Heart Failure, Hypertension, Type II Diabetes, Gout, Osteoarthritis, Neuropathy Photos Photo Uploaded By: Alejandro MullingPinkerton, Debra on 10/10/2016 11:34:55 Wound Measurements Length: (cm) 3.5 Width: (cm) 1.5 Depth: (cm) 0.1 Area: (cm) 4.123 Volume: (cm) 0.412 % Reduction in Area: -250% % Reduction in Volume: -249.2% Epithelialization: None Tunneling: No Undermining: No Wound Description Classification: Partial Thickness Diabetic Severity (Wagner): Grade 1 Wound Margin: Distinct, outline attach Exudate Amount: Large Exudate Type: Serosanguineous Exudate Color: red, brown Pagliarulo, Iran V. ([id number]) Foul Odor After Cleansing: No ed Wound Bed Granulation Amount: Large (67-100%) Exposed Structure Granulation Quality: Pink, Pale Fascia Exposed: No Necrotic Amount: Small (1-33%) Fat Layer Exposed: No Necrotic Quality: Adherent Slough Tendon Exposed: No Muscle Exposed: No Joint Exposed: No Bone Exposed: No Limited to Skin Breakdown Periwound Skin Texture Texture Color No Abnormalities Noted: No No Abnormalities Noted: No Callus: No Atrophie Blanche: No Crepitus: No Cyanosis: No Excoriation: No Ecchymosis: No Fluctuance: No Erythema: No Friable: No Hemosiderin Staining: Yes Induration: No Mottled: Yes Localized Edema: Yes Pallor: No Rash: No Rubor: No Scarring: No Temperature / Pain Moisture Temperature: No Abnormality No Abnormalities Noted: No Tenderness on Palpation: Yes Dry / Scaly: No Maceration: No Moist: Yes Wound Preparation Ulcer Cleansing: Other: surg scrub and water, Topical Anesthetic Applied: None Treatment Notes Wound #10 (Right, Anterior Lower Leg) 1. Cleansed with: Clean wound with Normal Saline Cleanse wound with antibacterial soap and water 4. Dressing Applied: Aquacel Ag 5. Secondary Dressing Applied ABD Pad 7. Secured  with Tape 3 Layer Compression System - Bilateral Notes unna to anchor, xtrasorb Clint GuyLINDLEY, Ranjit V. ([id number]) Electronic Signature(s) Signed: 10/11/2016 8:07:17 AM By: Alejandro MullingPinkerton, Debra Entered By: Alejandro MullingPinkerton, Debra on 10/10/2016 09:00:04 [name] OttoLINDLEY, Benedicto V. ([id number]) -------------------------------------------------------------------------------- Wound Assessment Details Patient Name: [name] OttoLINDLEY, Yassen V. Date of Service: 10/10/2016 8:45 AM Medical Record Number: [id number] Patient Account Number: [id number] Date of Birth/Sex: Feb 21, 1946 [age] y.o. Male) Treating RN: [name] HaggisPinkerton, Debi Primary Care Physician: Oretha MilchSMITH, [name] Other Clinician: Referring Physician: Oretha MilchSMITH, [name] Treating Physician/Extender: Bonnell Publicoulter, Leah Weeks in Treatment: 40 Wound Status Wound Number: 3 Primary Pressure Ulcer Etiology: Wound Location: Right Calcaneus Wound Open Wounding Event: Pressure Injury Status: Date Acquired: 10/31/2015 Comorbid Cataracts, Chronic Obstructive Weeks Of Treatment: 40 History: Pulmonary Disease (COPD), Sleep Clustered Wound: No Apnea, Angina, Congestive Heart Failure, Hypertension, Type II Diabetes, Gout, Osteoarthritis, Neuropathy Photos Photo Uploaded By: Ashok CordiaPinkerton,  Debra on 10/10/2016 11:34:56 Wound Measurements Length: (cm) 0.5 Width: (cm) 1.5 Depth: (cm) 0.2 Area: (cm) 0.589 Volume: (cm) 0.118 % Reduction in Area: 91.7% % Reduction in Volume: 91.7% Epithelialization: None Tunneling: No Undermining: No Wound Description Classification: Category/Stage III Diabetic Severity [address](Wagner): Grade 1 Wound Margin: Distinct, outline attache Exudate Amount: Large Exudate Type: Serous Exudate Color: amber Foul Odor After Cleansing: No d Wound Bed Granulation Amount: Large (67-100%) Exposed Structure Bradner, Adalid V. ([id number]) Granulation Quality: Pink, Pale Fascia Exposed: No Necrotic Amount: Small (1-33%) Fat Layer Exposed: No Necrotic Quality: Adherent Slough Tendon  Exposed: No Muscle Exposed: No Joint Exposed: No Bone Exposed: No Limited to Skin Breakdown Periwound Skin Texture Texture Color No Abnormalities Noted: No No Abnormalities Noted: No Callus: No Atrophie Blanche: No Crepitus: No Cyanosis: No Excoriation: No Ecchymosis: No Fluctuance: No Erythema: No Friable: No Hemosiderin Staining: Yes Induration: No Mottled: Yes Localized Edema: Yes Pallor: No Rash: No Rubor: No Scarring: No Temperature / Pain Moisture Temperature: No Abnormality No Abnormalities Noted: No Dry / Scaly: No Maceration: No Moist: Yes Wound Preparation Ulcer Cleansing: Other: surg scrub and water, Topical Anesthetic Applied: Other: lidocaine 4%, Treatment Notes Wound #3 (Right Calcaneus) 1. Cleansed with: Clean wound with Normal Saline 2. Anesthetic Topical Lidocaine 4% cream to wound bed prior to debridement 3. Peri-wound Care: Ointment 4. Dressing Applied: Hydrafera Blue 5. Secondary Dressing Applied ABD Pad 7. Secured with Tape 3 Layer Compression System - Bilateral Electronic Signature(s) [name] OttoLINDLEY, Tyray V. ([id number]) Signed: 10/11/2016 8:07:17 AM By: Alejandro MullingPinkerton, Debra Entered By: Alejandro MullingPinkerton, Debra on 10/10/2016 08:59:17 Clint GuyLINDLEY, Deveon [name] Kitchen. ([id number]) -------------------------------------------------------------------------------- Wound Assessment Details Patient Name: [name] OttoLINDLEY, Ewart V. Date of Service: 10/10/2016 8:45 AM Medical Record Number: [id number] Patient Account Number: [id number] Date of Birth/Sex: 10-15-1946 [age] y.o. Male) Treating RN: [name] HaggisPinkerton, Debi Primary Care Physician: Oretha MilchSMITH, [name] Other Clinician: Referring Physician: Oretha MilchSMITH, [name] Treating Physician/Extender: Bonnell Publicoulter, Leah Weeks in Treatment: 40 Wound Status Wound Number: 9 Primary Diabetic Wound/Ulcer of the Lower Etiology: Extremity Wound Location: Left Lower Leg - Anterior Secondary Venous Leg Ulcer Wounding Event: Gradually Appeared Etiology: Date Acquired:  08/09/2016 Wound Open Weeks Of Treatment: 8 Status: Clustered Wound: Yes Comorbid Cataracts, Chronic Obstructive History: Pulmonary Disease (COPD), Sleep Apnea, Angina, Congestive Heart Failure, Hypertension, Type II Diabetes, Gout, Osteoarthritis, Neuropathy Photos Photo Uploaded By: Alejandro MullingPinkerton, Debra on 10/10/2016 11:35:16 Wound Measurements Length: (cm) 17 Width: (cm) 14.5 Depth: (cm) 0.1 Area: (cm) 193.601 Volume: (cm) 19.36 % Reduction in Area: -93.3% % Reduction in Volume: -93.3% Epithelialization: None Tunneling: No Undermining: No Wound Description Classification: Grade 2 Wound Margin: Distinct, outline attached Exudate Amount: Large Exudate Type: Serosanguineous Exudate Color: red, brown Foul Odor After Cleansing: No Wound Bed Moxey, Marquee V. ([id number]) Granulation Amount: Large (67-100%) Exposed Structure Granulation Quality: Red Fascia Exposed: No Necrotic Amount: Small (1-33%) Fat Layer Exposed: No Necrotic Quality: Adherent Slough Tendon Exposed: No Muscle Exposed: No Joint Exposed: No Bone Exposed: No Limited to Skin Breakdown Periwound Skin Texture Texture Color No Abnormalities Noted: No No Abnormalities Noted: No Callus: No Atrophie Blanche: No Crepitus: No Cyanosis: No Excoriation: No Ecchymosis: No Fluctuance: No Erythema: No Friable: No Hemosiderin Staining: Yes Induration: No Mottled: Yes Localized Edema: Yes Pallor: No Rash: No Rubor: No Scarring: No Temperature / Pain Moisture Temperature: No Abnormality No Abnormalities Noted: No Tenderness on Palpation: Yes Dry / Scaly: No Maceration: No Moist: Yes Wound Preparation Ulcer Cleansing: Other: surg scrub and water, Topical Anesthetic Applied: None Treatment Notes Wound #9 (Left, Anterior Lower Leg) 1. Cleansed  with: Clean wound with Normal Saline Cleanse wound with antibacterial soap and water 4. Dressing Applied: Aquacel Ag 5. Secondary Dressing Applied ABD  Pad 7. Secured with Tape 3 Layer Compression System - Bilateral Notes unna to anchor, xtrasorb KORTNEY, SCHOENFELDER ([id number]) Electronic Signature(s) Signed: 10/11/2016 8:07:17 AM By: Alejandro Mulling Entered By: Alejandro Mulling on 10/10/2016 09:04:51 [name] ([id number]) -------------------------------------------------------------------------------- Vitals Details Patient Name: [name] Date of Service: 10/10/2016 8:45 AM Medical Record Number: [id number] Patient Account Number: [id number] Date of Birth/Sex: May 22, 1946 (70 y.o. Male) Treating RN: [name] Primary Care Physician: Oretha Milch Other Clinician: Referring Physician: Oretha Milch Treating Physician/Extender: Rudene Re in Treatment: 40 Vital Signs Time Taken: 08:42 Temperature (F): 97.7 Height (in): 70 Pulse (bpm): 61 Weight (lbs): 235 Respiratory Rate (breaths/min): 16 Body Mass Index (BMI): 33.7 Blood Pressure (mmHg): 119/70 Reference Range: 80 - 120 mg / dl Electronic Signature(s) Signed: 10/11/2016 8:07:17 AM By: Alejandro Mulling Entered By: Alejandro Mulling on 10/10/2016 08:43:09

## 2016-10-17 ENCOUNTER — Encounter: Payer: Medicare Other | Attending: Surgery | Admitting: Surgery

## 2016-10-17 DIAGNOSIS — M70871 Other soft tissue disorders related to use, overuse and pressure, right ankle and foot: Secondary | ICD-10-CM | POA: Insufficient documentation

## 2016-10-17 DIAGNOSIS — I5032 Chronic diastolic (congestive) heart failure: Secondary | ICD-10-CM | POA: Diagnosis not present

## 2016-10-17 DIAGNOSIS — J449 Chronic obstructive pulmonary disease, unspecified: Secondary | ICD-10-CM | POA: Insufficient documentation

## 2016-10-17 DIAGNOSIS — L89613 Pressure ulcer of right heel, stage 3: Secondary | ICD-10-CM | POA: Diagnosis not present

## 2016-10-17 DIAGNOSIS — I2510 Atherosclerotic heart disease of native coronary artery without angina pectoris: Secondary | ICD-10-CM | POA: Diagnosis not present

## 2016-10-17 DIAGNOSIS — I70234 Atherosclerosis of native arteries of right leg with ulceration of heel and midfoot: Secondary | ICD-10-CM | POA: Insufficient documentation

## 2016-10-17 DIAGNOSIS — Z87891 Personal history of nicotine dependence: Secondary | ICD-10-CM | POA: Diagnosis not present

## 2016-10-17 DIAGNOSIS — E11621 Type 2 diabetes mellitus with foot ulcer: Secondary | ICD-10-CM | POA: Insufficient documentation

## 2016-10-17 DIAGNOSIS — G4733 Obstructive sleep apnea (adult) (pediatric): Secondary | ICD-10-CM | POA: Diagnosis not present

## 2016-10-17 DIAGNOSIS — Z6833 Body mass index (BMI) 33.0-33.9, adult: Secondary | ICD-10-CM | POA: Diagnosis not present

## 2016-10-17 DIAGNOSIS — I110 Hypertensive heart disease with heart failure: Secondary | ICD-10-CM | POA: Insufficient documentation

## 2016-10-17 DIAGNOSIS — I890 Lymphedema, not elsewhere classified: Secondary | ICD-10-CM | POA: Insufficient documentation

## 2016-10-18 NOTE — Progress Notes (Signed)
STEFFEN, HASE ([id number]) Visit Report for 10/17/2016 Arrival Information Details Patient Name: [name], [name] Date of Service: 10/17/2016 8:45 AM Medical Record Number: [id number] Patient Account Number: [id number] Date of Birth/Sex: May 06, 1946 (70 y.o. Male) Treating RN: Phillis Haggis Primary Care Physician: Oretha Milch Other Clinician: Referring Physician: Oretha Milch Treating Physician/Extender: Rudene Re in Treatment: 17 Visit Information History Since Last Visit All ordered tests and consults were completed: No Patient Arrived: Wheel Chair Added or deleted any medications: No Arrival Time: 08:40 Any new allergies or adverse reactions: No Accompanied By: self Had a fall or experienced change in No Transfer Assistance: EasyPivot activities of daily living that may affect Patient Lift risk of falls: Patient Identification Verified: Yes Signs or symptoms of abuse/neglect since last No Secondary Verification Process Yes visito Completed: Hospitalized since last visit: No Patient Requires Transmission- No Pain Present Now: No Based Precautions: Patient Has Alerts: Yes Patient Alerts: DM II Electronic Signature(s) Signed: 10/17/2016 5:08:32 PM By: Alejandro Mulling Entered By: Alejandro Mulling on 10/17/2016 08:41:13 [name] ([id number]) -------------------------------------------------------------------------------- Encounter Discharge Information Details Patient Name: [name] Date of Service: 10/17/2016 8:45 AM Medical Record Number: [id number] Patient Account Number: [id number] Date of Birth/Sex: 1946-10-17 (70 y.o. Male) Treating RN: Phillis Haggis Primary Care Physician: Oretha Milch Other Clinician: Referring Physician: Oretha Milch Treating Physician/Extender: Rudene Re in Treatment: 45 Encounter Discharge Information Items Discharge Pain Level: 0 Discharge Condition: Stable Ambulatory Status: Wheelchair Discharge  Destination: Nursing Home Transportation: Other Accompanied By: self Schedule Follow-up Appointment: Yes Medication Reconciliation completed and provided to Patient/Care Yes [name]: Provided on Clinical Summary of Care: 10/17/2016 Form Type Recipient Paper Patient EL Electronic Signature(s) Signed: 10/17/2016 9:51:56 AM By: Gwenlyn Perking Entered By: Gwenlyn Perking on 10/17/2016 09:51:55 [name], [name] ([id number]) -------------------------------------------------------------------------------- Lower Extremity Assessment Details Patient Name: [name] Date of Service: 10/17/2016 8:45 AM Medical Record Number: [id number] Patient Account Number: [id number] Date of Birth/Sex: 03/07/1946 (70 y.o. Male) Treating RN: Phillis Haggis Primary Care Physician: Oretha Milch Other Clinician: Referring Physician: Oretha Milch Treating Physician/Extender: Rudene Re in Treatment: 41 Edema Assessment Assessed: [Left: No] [Right: No] E[Left: dema] [Right: :] Calf Left: Right: Point of Measurement: 34 cm From Medial Instep 39.5 cm 38.8 cm Ankle Left: Right: Point of Measurement: 12 cm From Medial Instep 26.2 cm 25.2 cm Vascular Assessment Pulses: Posterior Tibial Extremity colors, hair growth, and conditions: Extremity Color: [Left:Red] [Right:Red] Temperature of Extremity: [Left:Warm] [Right:Warm] Capillary Refill: [Left:< 3 seconds] [Right:< 3 seconds] Toe Nail Assessment Left: Right: Thick: Yes Yes Discolored: Yes Yes Deformed: Yes Yes Improper Length and Hygiene: Yes Yes Electronic Signature(s) Signed: 10/17/2016 5:08:32 PM By: Alejandro Mulling Entered By: Alejandro Mulling on 10/17/2016 08:53:24 [name], [name] Kitchen ([id number]) -------------------------------------------------------------------------------- Multi Wound Chart Details Patient Name: [name] Date of Service: 10/17/2016 8:45 AM Medical Record Number: [id number] Patient Account Number:  [id number] Date of Birth/Sex: [birth year] (70 y.o. Male) Treating RN: Phillis Haggis Primary Care Physician: Oretha Milch Other Clinician: Referring Physician: Oretha Milch Treating Physician/Extender: Rudene Re in Treatment: 43 Vital Signs Height(in): 70 Pulse(bpm): 60 Weight(lbs): 235 Blood Pressure 126/60 (mmHg): Body Mass Index(BMI): 34 Temperature(F): 97.6 Respiratory Rate 16 (breaths/min): Photos: Wound Location: Right Lower Leg - Anterior Right Calcaneus Left Lower Leg - Anterior Wounding Event: Blister Pressure Injury Gradually Appeared Primary Etiology: Lymphedema Pressure Ulcer 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 Comorbid History: Cataracts, Chronic Cataracts, Chronic Cataracts, Chronic Obstructive Pulmonary  Obstructive Pulmonary Obstructive Pulmonary Disease (COPD), Sleep Disease (COPD), Sleep Disease (COPD), Sleep Apnea, Angina, Apnea, Angina, Apnea, Angina, Congestive Heart Failure, Congestive Heart Failure, Congestive Heart Failure, Hypertension, Type II Hypertension, Type II Hypertension, Type II Diabetes, Gout, Diabetes, Gout, Diabetes, Gout, Osteoarthritis, Neuropathy Osteoarthritis, Neuropathy Osteoarthritis, Neuropathy Date Acquired: 08/22/2016 10/31/2015 08/09/2016 [name] of Treatment: 8 41 9 Wound Status: Open Open Open Clustered Wound: No No Yes Measurements L x W x D 18x27.2x0.1 0.4x1.4x0.2 18x36x0.1 (cm) Area (cm) : 384.531 0.44 508.938 Volume (cm) : 38.453 0.088 50.894 % Reduction in Area: -32542.70% 93.80% -408.20% [name], [name] ([id number]) % Reduction in Volume: -32487.30% 93.80% -408.20% Classification: Partial Thickness Category/Stage III Grade 2 HBO Classification: Grade 1 Grade 1 N/A Exudate Amount: Large Large Large Exudate Type: Serosanguineous Serous Serosanguineous Exudate Color: red, brown amber red, brown Wound Margin:  Distinct, outline attached Distinct, outline attached Distinct, outline attached Granulation Amount: Large (67-100%) Medium (34-66%) Large (67-100%) Granulation Quality: Pink, Pale Pink, Pale Red Necrotic Amount: Small (1-33%) Medium (34-66%) Small (1-33%) Exposed Structures: Fascia: No Fascia: No Fascia: No Fat: No Fat: No Fat: No Tendon: No Tendon: No Tendon: No Muscle: No Muscle: No Muscle: No Joint: No Joint: No Joint: No Bone: No Bone: No Bone: No Limited to Skin Limited to Skin Limited to Skin Breakdown Breakdown Breakdown Epithelialization: None None None Periwound Skin Texture: Edema: Yes Edema: Yes Edema: Yes Excoriation: No Excoriation: No Excoriation: No Induration: No Induration: No Induration: No Callus: No Callus: No Callus: No Crepitus: No Crepitus: No Crepitus: No Fluctuance: No Fluctuance: No Fluctuance: No Friable: No Friable: No Friable: No Rash: No Rash: No Rash: No Scarring: No Scarring: No Scarring: No Periwound Skin Moist: Yes Moist: Yes Moist: Yes Moisture: Maceration: No Maceration: No Maceration: No Dry/Scaly: No Dry/Scaly: No Dry/Scaly: No Periwound Skin Color: Hemosiderin Staining: Yes Hemosiderin Staining: Yes Hemosiderin Staining: Yes Mottled: Yes Mottled: Yes Mottled: Yes Atrophie Blanche: No Atrophie Blanche: No Atrophie Blanche: No Cyanosis: No Cyanosis: No Cyanosis: No Ecchymosis: No Ecchymosis: No Ecchymosis: No Erythema: No Erythema: No Erythema: No Pallor: No Pallor: No Pallor: No Rubor: No Rubor: No Rubor: No Temperature: No Abnormality No Abnormality No Abnormality Tenderness on Yes No Yes Palpation: Wound Preparation: Ulcer Cleansing: Other: Ulcer Cleansing: Other: Ulcer Cleansing: Other: surg scrub and water surg scrub and water surg scrub and water Topical Anesthetic Topical Anesthetic Topical Anesthetic Applied: None Applied: Other: lidocaine Applied: None 4% Treatment  Notes ILAI, HILLER ([id number]) Electronic Signature(s) Signed: 10/17/2016 5:08:32 PM By: Alejandro Mulling Entered By: Alejandro Mulling on 10/17/2016 09:09:34 [name] ([id number]) -------------------------------------------------------------------------------- Multi-Disciplinary Care Plan Details Patient Name: [name] Date of Service: 10/17/2016 8:45 AM Medical Record Number: [id number] Patient Account Number: [id number] Date of Birth/Sex: 1946-10-15 (70 y.o. Male) Treating RN: Phillis Haggis Primary Care Physician: Oretha Milch Other Clinician: Referring Physician: Oretha Milch Treating Physician/Extender: Rudene Re in Treatment: 19 Active Inactive Abuse / Safety / Falls / Self Care Management Nursing Diagnoses: Potential for falls Goals: Patient will remain injury free Date Initiated: 03/01/2016 Goal Status: Active Interventions: Assess fall risk on admission and as needed Notes: Nutrition Nursing Diagnoses: Imbalanced nutrition Goals: Patient/caregiver agrees to and verbalizes understanding of need to use nutritional supplements and/or vitamins as prescribed Date Initiated: 03/01/2016 Goal Status: Active Interventions: Assess patient nutrition upon admission and as needed per policy Notes: Orientation to the Wound Care Program Nursing Diagnoses: Knowledge deficit related to the wound healing center program Goals: Patient/caregiver will verbalize understanding of the Wound Healing  Center [address]Program [place], [name] DakotaDWIN V. ([id number]) Date Initiated: 03/01/2016 Goal Status: Active Interventions: Provide education on orientation to the wound center Notes: Pain, Acute or Chronic Nursing Diagnoses: Pain, acute or chronic: actual or potential Potential alteration in comfort, pain Goals: Patient will verbalize adequate pain control and receive pain control interventions during procedures as needed Date Initiated: 03/01/2016 Goal Status:  Active Patient/caregiver will verbalize adequate pain control between visits Date Initiated: 03/01/2016 Goal Status: Active Interventions: Assess comfort goal upon admission Complete pain assessment as per visit requirements Notes: Pressure Nursing Diagnoses: Knowledge deficit related to causes and risk factors for pressure ulcer development Knowledge deficit related to management of pressures ulcers Goals: Patient/caregiver will verbalize risk factors for pressure ulcer development Date Initiated: 03/01/2016 Goal Status: Active Interventions: Assess offloading mechanisms upon admission and as needed Notes: Wound/Skin Impairment Nursing Diagnoses: [name] OttoLINDLEY, [name] ([id number]) Impaired tissue integrity Goals: Ulcer/skin breakdown will have a volume reduction of 30% by week 4 Date Initiated: 03/01/2016 Goal Status: Active Ulcer/skin breakdown will have a volume reduction of 50% by week 8 Date Initiated: 03/01/2016 Goal Status: Active Ulcer/skin breakdown will have a volume reduction of 80% by week 12 Date Initiated: 03/01/2016 Goal Status: Active Interventions: Assess ulceration(s) every visit Notes: Electronic Signature(s) Signed: 10/17/2016 5:08:32 PM By: Alejandro MullingPinkerton, Debra Entered By: Alejandro MullingPinkerton, Debra on 10/17/2016 09:09:20 [name] OttoLINDLEY, [name] ([id number]) -------------------------------------------------------------------------------- Pain Assessment Details Patient Name: [name] OttoLINDLEY, [name] Date of Service: 10/17/2016 8:45 AM Medical Record Number: [id number] Patient Account Number: [id number] Date of Birth/Sex: 1946/07/09 22(70 y.o. Male) Treating RN: Phillis HaggisPinkerton, Debi Primary Care Physician: Oretha MilchSMITH, [name] Other Clinician: Referring Physician: Oretha MilchSMITH, [name] Treating Physician/Extender: Rudene ReBritto, [name] in Treatment: 6041 Active Problems Location of Pain Severity and Description of Pain Patient Has Paino No Site Locations With Dressing Change: No Pain Management and  Medication Current Pain Management: Electronic Signature(s) Signed: 10/17/2016 5:08:32 PM By: Alejandro MullingPinkerton, Debra Entered By: Alejandro MullingPinkerton, Debra on 10/17/2016 08:41:21 [name] OttoLINDLEY, Zhion V. ([id number]) -------------------------------------------------------------------------------- Patient/Caregiver Education Details Patient Name: [name] OttoLINDLEY, [name] Date of Service: 10/17/2016 8:45 AM Medical Record Number: [id number] Patient Account Number: [id number] Date of Birth/Gender: 1946/07/09 38(70 y.o. Male) Treating RN: Phillis HaggisPinkerton, Debi Primary Care Physician: Oretha MilchSMITH, [name] Other Clinician: Referring Physician: Oretha MilchSMITH, [name] Treating Physician/Extender: Rudene ReBritto, [name] in Treatment: 7441 Education Assessment Education Provided To: Patient Education Topics Provided Wound/Skin Impairment: Handouts: Other: do not get wraps wet Methods: Demonstration, Explain/Verbal Responses: State content correctly Electronic Signature(s) Signed: 10/17/2016 5:08:32 PM By: Alejandro MullingPinkerton, Debra Entered By: Alejandro MullingPinkerton, Debra on 10/17/2016 09:45:09 [name], [name] ([id number]) -------------------------------------------------------------------------------- Wound Assessment Details Patient Name: [name] OttoLINDLEY, Tariq V. Date of Service: 10/17/2016 8:45 AM Medical Record Number: [id number] Patient Account Number: [id number] Date of Birth/Sex: 1946/07/09 14(70 y.o. Male) Treating RN: Phillis HaggisPinkerton, Debi Primary Care Physician: Oretha MilchSMITH, [name] Other Clinician: Referring Physician: Oretha MilchSMITH, [name] Treating Physician/Extender: Rudene ReBritto, [name] in Treatment: 4341 Wound Status Wound Number: 10 Primary Lymphedema Etiology: Wound Location: Right Lower Leg - Anterior Secondary Diabetic Wound/Ulcer of the Lower Wounding Event: Blister Etiology: Extremity Date Acquired: 08/22/2016 Wound Open [name] Of Treatment: 8 Status: Clustered Wound: No Comorbid Cataracts, Chronic Obstructive History: Pulmonary Disease (COPD), Sleep Apnea, Angina, Congestive  Heart Failure, Hypertension, Type II Diabetes, Gout, Osteoarthritis, Neuropathy Photos Photo Uploaded By: Alejandro MullingPinkerton, Debra on 10/17/2016 09:05:01 Wound Measurements Length: (cm) 18 Width: (cm) 27.2 Depth: (cm) 0.1 Area: (cm) 384.531 Volume: (cm) 38.453 % Reduction in Area: -32542.7% % Reduction in Volume: -32487.3% Epithelialization: None Tunneling: No Undermining: No Wound Description Classification: Partial Thickness Diabetic Severity [address](Wagner): Grade 1 Wound Margin: Distinct,  outline attach Exudate Amount: Large Exudate Type: Serosanguineous Exudate Color: red, brown Metallo, Dub V. ([id number]) Foul Odor After Cleansing: No ed Wound Bed Granulation Amount: Large (67-100%) Exposed Structure Granulation Quality: Pink, Pale Fascia Exposed: No Necrotic Amount: Small (1-33%) Fat Layer Exposed: No Necrotic Quality: Adherent Slough Tendon Exposed: No Muscle Exposed: No Joint Exposed: No Bone Exposed: No Limited to Skin Breakdown Periwound Skin Texture Texture Color No Abnormalities Noted: No No Abnormalities Noted: No Callus: No Atrophie Blanche: No Crepitus: No Cyanosis: No Excoriation: No Ecchymosis: No Fluctuance: No Erythema: No Friable: No Hemosiderin Staining: Yes Induration: No Mottled: Yes Localized Edema: Yes Pallor: No Rash: No Rubor: No Scarring: No Temperature / Pain Moisture Temperature: No Abnormality No Abnormalities Noted: No Tenderness on Palpation: Yes Dry / Scaly: No Maceration: No Moist: Yes Wound Preparation Ulcer Cleansing: Other: surg scrub and water, Topical Anesthetic Applied: None Treatment Notes Wound #10 (Right, Anterior Lower Leg) 1. Cleansed with: Clean wound with Normal Saline Cleanse wound with antibacterial soap and water 2. Anesthetic Topical Lidocaine 4% cream to wound bed prior to debridement 4. Dressing Applied: Aquacel Ag 5. Secondary Dressing Applied ABD Pad 7. Secured with Tape 3 Layer  Compression System - Bilateral Roussin, Sidi V. ([id number]) Notes xtrasorb, unna to Ecologistanchor Electronic Signature(s) Signed: 10/17/2016 5:08:32 PM By: Alejandro MullingPinkerton, Debra Entered By: Alejandro MullingPinkerton, Debra on 10/17/2016 09:00:30 [name] OttoLINDLEY, Dre V. ([id number]) -------------------------------------------------------------------------------- Wound Assessment Details Patient Name: [name] OttoLINDLEY, Shaquile V. Date of Service: 10/17/2016 8:45 AM Medical Record Number: [id number] Patient Account Number: [id number] Date of Birth/Sex: 1946/03/15 36(70 y.o. Male) Treating RN: Phillis HaggisPinkerton, Debi Primary Care Physician: Oretha MilchSMITH, [name] Other Clinician: Referring Physician: Oretha MilchSMITH, [name] Treating Physician/Extender: Rudene ReBritto, [name] in Treatment: 41 Wound Status Wound Number: 3 Primary Pressure Ulcer Etiology: Wound Location: Right Calcaneus Wound Open Wounding Event: Pressure Injury Status: Date Acquired: 10/31/2015 Comorbid Cataracts, Chronic Obstructive [name] Of Treatment: 41 History: Pulmonary Disease (COPD), Sleep Clustered Wound: No Apnea, Angina, Congestive Heart Failure, Hypertension, Type II Diabetes, Gout, Osteoarthritis, Neuropathy Photos Photo Uploaded By: Alejandro MullingPinkerton, Debra on 10/17/2016 09:05:19 Wound Measurements Length: (cm) 0.4 Width: (cm) 1.4 Depth: (cm) 0.2 Area: (cm) 0.44 Volume: (cm) 0.088 % Reduction in Area: 93.8% % Reduction in Volume: 93.8% Epithelialization: None Tunneling: No Undermining: No Wound Description Classification: Category/Stage III Diabetic Severity [address](Wagner): Grade 1 Wound Margin: Distinct, outline attach Exudate Amount: Large Exudate Type: Serous Exudate Color: amber Foul Odor After Cleansing: No ed Wound Bed Granulation Amount: Medium (34-66%) Exposed Structure Lampkins, Johnnathan V. ([id number]) Granulation Quality: Pink, Pale Fascia Exposed: No Necrotic Amount: Medium (34-66%) Fat Layer Exposed: No Necrotic Quality: Adherent Slough Tendon Exposed: No Muscle  Exposed: No Joint Exposed: No Bone Exposed: No Limited to Skin Breakdown Periwound Skin Texture Texture Color No Abnormalities Noted: No No Abnormalities Noted: No Callus: No Atrophie Blanche: No Crepitus: No Cyanosis: No Excoriation: No Ecchymosis: No Fluctuance: No Erythema: No Friable: No Hemosiderin Staining: Yes Induration: No Mottled: Yes Localized Edema: Yes Pallor: No Rash: No Rubor: No Scarring: No Temperature / Pain Moisture Temperature: No Abnormality No Abnormalities Noted: No Dry / Scaly: No Maceration: No Moist: Yes Wound Preparation Ulcer Cleansing: Other: surg scrub and water, Topical Anesthetic Applied: Other: lidocaine 4%, Treatment Notes Wound #3 (Right Calcaneus) 1. Cleansed with: Clean wound with Normal Saline Cleanse wound with antibacterial soap and water 2. Anesthetic Topical Lidocaine 4% cream to wound bed prior to debridement 4. Dressing Applied: Hydrafera Blue 5. Secondary Dressing Applied ABD Pad 7. Secured with Tape 3 Layer Compression System - Bilateral Electronic  Signature(s) Signed: 10/17/2016 5:08:32 PM By: Evette Georges, Akiem V. ([id number]) Entered By: Alejandro Mulling on 10/17/2016 08:59:26 [name] ([id number]) -------------------------------------------------------------------------------- Wound Assessment Details Patient Name: [name] Date of Service: 10/17/2016 8:45 AM Medical Record Number: [id number] Patient Account Number: [id number] Date of Birth/Sex: 1946-04-10 (70 y.o. Male) Treating RN: Phillis Haggis Primary Care Physician: Oretha Milch Other Clinician: Referring Physician: Oretha Milch Treating Physician/Extender: Rudene Re in Treatment: 34 Wound Status Wound Number: 9 Primary Diabetic Wound/Ulcer of the Lower Etiology: Extremity Wound Location: Left Lower Leg - Anterior Secondary Venous Leg Ulcer Wounding Event: Gradually Appeared Etiology: Date Acquired:  08/09/2016 Wound Open [name] Of Treatment: 9 Status: Clustered Wound: Yes Comorbid Cataracts, Chronic Obstructive History: Pulmonary Disease (COPD), Sleep Apnea, Angina, Congestive Heart Failure, Hypertension, Type II Diabetes, Gout, Osteoarthritis, Neuropathy Photos Photo Uploaded By: Alejandro Mulling on 10/17/2016 09:05:02 Wound Measurements Length: (cm) 18 Width: (cm) 36 Depth: (cm) 0.1 Area: (cm) 508.938 Volume: (cm) 50.894 % Reduction in Area: -408.2% % Reduction in Volume: -408.2% Epithelialization: None Tunneling: No Undermining: No Wound Description Classification: Grade 2 Foul Odor Afte Wound Margin: Distinct, outline attached Exudate Amount: Large Exudate Type: Serosanguineous Exudate Color: red, brown r Cleansing: No Wound Bed Casebolt, Arshawn V. ([id number]) Granulation Amount: Large (67-100%) Exposed Structure Granulation Quality: Red Fascia Exposed: No Necrotic Amount: Small (1-33%) Fat Layer Exposed: No Necrotic Quality: Adherent Slough Tendon Exposed: No Muscle Exposed: No Joint Exposed: No Bone Exposed: No Limited to Skin Breakdown Periwound Skin Texture Texture Color No Abnormalities Noted: No No Abnormalities Noted: No Callus: No Atrophie Blanche: No Crepitus: No Cyanosis: No Excoriation: No Ecchymosis: No Fluctuance: No Erythema: No Friable: No Hemosiderin Staining: Yes Induration: No Mottled: Yes Localized Edema: Yes Pallor: No Rash: No Rubor: No Scarring: No Temperature / Pain Moisture Temperature: No Abnormality No Abnormalities Noted: No Tenderness on Palpation: Yes Dry / Scaly: No Maceration: No Moist: Yes Wound Preparation Ulcer Cleansing: Other: surg scrub and water, Topical Anesthetic Applied: None Treatment Notes Wound #9 (Left, Anterior Lower Leg) 1. Cleansed with: Clean wound with Normal Saline Cleanse wound with antibacterial soap and water 2. Anesthetic Topical Lidocaine 4% cream to wound bed prior to  debridement 4. Dressing Applied: Aquacel Ag 5. Secondary Dressing Applied ABD Pad 7. Secured with Tape 3 Layer Compression System - Bilateral Notes Leth, Vasilios V. ([id number]) xtrasorb, unna to Ecologist) Signed: 10/17/2016 5:08:32 PM By: Alejandro Mulling Entered By: Alejandro Mulling on 10/17/2016 09:01:06 [name] ([id number]) -------------------------------------------------------------------------------- Vitals Details Patient Name: [name] Date of Service: 10/17/2016 8:45 AM Medical Record Number: [id number] Patient Account Number: [id number] Date of Birth/Sex: 1945/12/20 (70 y.o. Male) Treating RN: Phillis Haggis Primary Care Physician: Oretha Milch Other Clinician: Referring Physician: Oretha Milch Treating Physician/Extender: Rudene Re in Treatment: 71 Vital Signs Time Taken: 08:42 Temperature (F): 97.6 Height (in): 70 Pulse (bpm): 60 Weight (lbs): 235 Respiratory Rate (breaths/min): 16 Body Mass Index (BMI): 33.7 Blood Pressure (mmHg): 126/60 Reference Range: 80 - 120 mg / dl Electronic Signature(s) Signed: 10/17/2016 5:08:32 PM By: Alejandro Mulling Entered By: Alejandro Mulling on 10/17/2016 08:44:38

## 2016-10-18 NOTE — Progress Notes (Signed)
[name], [name] ([id number]) Visit Report for 10/17/2016 Chief Complaint Document Details Patient Name: [name], [name] Date of Service: 10/17/2016 8:45 AM Medical Record Number: [id number] Patient Account Number: [id number] Date of Birth/Sex: February 09, 1946 (70 y.o. Male) Treating RN: Phillis Haggis Primary Care Physician: Oretha Milch Other Clinician: Referring Physician: Oretha Milch Treating Physician/Extender: Rudene Re in Treatment: 42 Information Obtained from: Patient Chief Complaint Mr. [name] presents for follow-up on his right heel pressure ulcer and bilateral lower extremity lymphedema and venous ulcerations. Electronic Signature(s) Signed: 10/17/2016 9:20:06 AM By: Evlyn Kanner MD, FACS Entered By: Evlyn Kanner on 10/17/2016 09:20:06 [name] ([id number]) -------------------------------------------------------------------------------- HPI Details Patient Name: [name] Date of Service: 10/17/2016 8:45 AM Medical Record Number: [id number] Patient Account Number: [id number] Date of Birth/Sex: 01-07-1946 (70 y.o. Male) Treating RN: Phillis Haggis Primary Care Physician: Oretha Milch Other Clinician: Referring Physician: Oretha Milch Treating Physician/Extender: Rudene Re in Treatment: 38 History of Present Illness Location: ulcerated area on the right heel, left gluteal region and thigh and then bilateral lower extremities Quality: Patient reports experiencing a sharp pain to affected area(s). Severity: Patient states wound are getting worse. Duration: Patient has had the wound for > 12 months prior to seeking treatment at the wound center Timing: Pain in wound is constant (hurts all the time) Context: The wound appeared gradually over time Modifying Factors: Other treatment(s) tried include: he sees his heart doctor and his primary care doctor Associated Signs and Symptoms: patient has not been able to walk for over a year now HPI  Description: 70 year old gentleman with a known history of hypertension, diabetes, obstructive sleep apnea, COPD, diastolic CHF, coronary artery disease was admitted to the hospital with sepsis from an ulcer of the right heel and was treated there in October 2016. he also has chronic bilateral lower extremity edema and lymphedema. He had received vancomycin, Zosyn and at that stage and x-rays showed hardware in the right ankle but no evidence of osteomyelitis. he was a former smoker. he was also treated with Augmentin orally for 10 days. He is either bedbound or wheelchair-bound and does not ambulate by himself. 01/19/2016 -- he has not been seen here for 3 weeks and this was because he was admitted to [facility] between 223 and 01/08/2016 for sepsis, UTI and pneumonia involving the left lung. he was treated with IV vancomycin and Zosyn and then meropenem. Was discharged home on oral Bactrim for 2 weeks none of his vascular test or x-rays were done and we will reorder these. 01/26/2016 -- he has not yet done the x-ray of his foot and is vascular tests are still pending. I have asked him to work on these with his nursing home staff. Addendum: he has got an x-ray of the right foot done which shows that his osteopenia but no specific ostial lysis or abnormal periosteal reaction. Final impression was degenerative changes with dorsal foot soft tissue swelling. 02/16/2016 -- lower extremity arterial duplex examination shows a 50-99% stenosis of the right tibioperoneal trunk. He had biphasic flow in the right SFA, popliteal and tibioperoneal trunk. Left-sided he had triphasic flow throughout 02/29/2016 -- he is awaiting his vascular opinion with Dr. Gilda Crease which is to be done on April 24 03/08/2016 -- he has not kept his appointment with Dr. Gilda Crease on April 24 and does not seem to know what happened about this. it's difficult to gauge whether he is in full control of his mental  faculties as at times he  is extremely rude to the nursing staff. 03/22/2016 -- the patient was seen by Dr. 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 Recommended that the patient had severe atherosclerotic changes of both lower extremities associated with ulceration and tissue loss of the foot. This is a limb threatening ischemia and the patient was recommended to undergo Tappen, Antonia V. ([id number]) angiography of the lower extremity with a hope for intervention for limb salvage. Patient agreed and will proceed to angiography. He was admitted to the hospital between May 2 and 03/15/2016 - he underwent induction of a catheter into his right lower extremity and third order catheter placement with contrast injection to the right lower extremity for distal runoff. Percutaneous transluminal angioplasty of the right superficial femoral and popliteal arteries were done. He also had a right peroneal angioplasty. Patient had a postoperative hematoma and was admitted for observation and in the next 24 hours he was very agitated and combative and had to be seen by psychiatric. He had a follow-up with Dr. Gilda CreaseSchnier in 3 weeks. 04/18/2016 -- notes reviewed from the vascular office where he was seen for his postop visit after the PTA of the right SFA and popliteal arteries on 03/12/2016. He underwent an ABI which showed right ABI to be more than 1.3 and left to be more than 1 more than 1.3, great toe and PPG waveforms are decreased bilaterally. No additional intervention indicated at this time and the patient was to follow-up in 3 months with an ABI and bilateral lower extremity duplex study. 05/02/2016 -- he complains that his compression stockings are causing him a lot of discomfort and he is not happy wearing them. 05/30/2016 -- the swelling of both legs has increased again and he has had  blisters which have opened out into ulcerations. 09/12/2016 -- the lymphedema pumps are still not delivered and hopefully will be done within this week. As far as his skin substitute goes we are still awaiting a response from his insurance 10-10-2016: Mr. Clint GuyLindley presents today and states that his left edema pumps have arrived but the he has only received treatment "5 times" in sterile arrival. He also denies using the offloading boot for the right heel but admits to having the boot. He states that staff is hesitant to assist and application of the lymphedema pumps. 10/17/2016 -- I understand he continues to take his diuretics and has been using his lymphedema pump once a day for about an hour. He cannot do it twice a day. Electronic Signature(s) Signed: 10/17/2016 9:20:39 AM By: Evlyn KannerBritto, Decari Duggar MD, FACS Entered By: Evlyn KannerBritto, Spiro Ausborn on 10/17/2016 09:20:39 [name] OttoLINDLEY, Jeremi V. ([id number]) -------------------------------------------------------------------------------- Physical Exam Details Patient Name: [name] OttoLINDLEY, Melesio V. Date of Service: 10/17/2016 8:45 AM Medical Record Number: [id number] Patient Account Number: [id number] Date of Birth/Sex: Jan 26, 1946 [age] y.o. Male) Treating RN: Phillis HaggisPinkerton, Debi Primary Care Physician: Oretha MilchSMITH, [name] Other Clinician: Referring Physician: Oretha MilchSMITH, [name] Treating Physician/Extender: Rudene ReBritto, Kirat Mezquita Weeks in Treatment: 41 Constitutional . Pulse regular. Respirations normal and unlabored. Afebrile. . Eyes Nonicteric. Reactive to light. Ears, Nose, Mouth, and Throat Lips, teeth, and gums WNL.Marland Kitchen. Moist mucosa without lesions. Neck supple and nontender. No palpable supraclavicular or cervical adenopathy. Normal sized without goiter. Respiratory WNL. No retractions.. Cardiovascular Pedal Pulses WNL. No clubbing, cyanosis or edema. Lymphatic No adneopathy. No adenopathy. No adenopathy. Musculoskeletal Adexa without tenderness or enlargement.. Digits and nails w/o clubbing,  cyanosis, infection, petechiae, ischemia, or  inflammatory conditions.. Integumentary (Hair, Skin) No suspicious lesions. No crepitus or fluctuance. No peri-wound warmth or erythema. No masses.Marland Kitchen Psychiatric Judgement and insight Intact.. No evidence of depression, anxiety, or agitation.. Notes the wound on the right medial calcaneus is fairly superficial but has a lot of subcutaneous debris which I could easily washout with moist saline gauze and there is healthy granulation tissue. Both lower extremity show lymphedema with some superficial blebs and excoriation and we need to continue working on this. Sharp debridement was required today. Electronic Signature(s) Signed: 10/17/2016 9:21:32 AM By: Evlyn Kanner MD, FACS Entered By: Evlyn Kanner on 10/17/2016 09:21:32 [name] ([id number]) -------------------------------------------------------------------------------- Physician Orders Details Patient Name: [name] Date of Service: 10/17/2016 8:45 AM Medical Record Number: [id number] Patient Account Number: [id number] Date of Birth/Sex: 18-Mar-1946 (70 y.o. Male) Treating RN: Phillis Haggis Primary Care Physician: Oretha Milch Other Clinician: Referring Physician: Oretha Milch Treating Physician/Extender: Rudene Re in Treatment: 82 Verbal / Phone Orders: Yes Clinician: Ashok Cordia, Debi Read Back and Verified: Yes Diagnosis Coding Wound Cleansing Wound #10 Right,Anterior Lower Leg o No tub bath. - only sink bath or bed bath Wound #3 Right Calcaneus o No tub bath. - only sink bath or bed bath Wound #9 Left,Anterior Lower Leg o No tub bath. - only sink bath or bed bath Anesthetic Wound #10 Right,Anterior Lower Leg o Topical Lidocaine 4% cream applied to wound bed prior to debridement - for clinic use only Wound #3 Right Calcaneus o Topical Lidocaine 4% cream applied to wound bed prior to debridement - for clinic use only Wound #9 Left,Anterior  Lower Leg o Topical Lidocaine 4% cream applied to wound bed prior to debridement - for clinic use only Skin Barriers/Peri-Wound Care Wound #3 Right Calcaneus o Barrier cream - around the heel wound to reddened and irritated areas Primary Wound Dressing Wound #10 Right,Anterior Lower Leg o Aquacel Ag Wound #3 Right Calcaneus o Hydrafera Blue Wound #9 Left,Anterior Lower Leg o Aquacel Ag Secondary Dressing Wound #10 Right,Anterior Lower Leg Reever, Ludie V. ([id number]) o ABD pad o XtraSorb Wound #3 Right Calcaneus o ABD pad Wound #9 Left,Anterior Lower Leg o ABD pad o XtraSorb Dressing Change Frequency Wound #10 Right,Anterior Lower Leg o Change dressing every week Wound #3 Right Calcaneus o Change dressing every week Wound #9 Left,Anterior Lower Leg o Change dressing every week Follow-up Appointments o Return Appointment in 1 week. Edema Control o 3 Layer Compression System - Bilateral o Compression Pump: Use compression pump on left lower extremity for 30 minutes, twice daily. o Compression Pump: Use compression pump on right lower extremity for 30 minutes, twice daily. o Other: - unna to anchor Off-Loading Wound #3 Right Calcaneus o Turn and reposition every 2 hours o Other: - sage boots at night and float heel when lying in bed *******lymphedema pumps to be placed on pt twice a day for one hour sessions******** Additional Orders / Instructions Wound #3 Right Calcaneus o Increase protein intake. Medications-please add to medication list. Wound #3 Right Calcaneus o Other: - Vitamin C, Vitamin A, Zinc, multivitamin Electronic Signature(s) CUINN, WESTERHOLD ([id number]) Signed: 10/17/2016 4:12:42 PM By: Evlyn Kanner MD, FACS Signed: 10/17/2016 5:08:32 PM By: Alejandro Mulling Entered By: Alejandro Mulling on 10/17/2016 09:13:20 [name]  ([id number]) -------------------------------------------------------------------------------- Problem List Details Patient Name: [name] Date of Service: 10/17/2016 8:45 AM Medical Record Number: [id number] Patient Account Number: [id number] Date of Birth/Sex: July 04, 1946 (70 y.o. Male) Treating RN: Phillis Haggis Primary Care Physician: Katrinka Blazing,  [name] Other Clinician: Referring Physician: Oretha MilchSMITH, [name] Treating Physician/Extender: Rudene ReBritto, Mell Mellott Weeks in Treatment: 4041 Active Problems ICD-10 Encounter Code Description Active Date Diagnosis E11.621 Type 2 diabetes mellitus with foot ulcer 01/01/2016 Yes L89.613 Pressure ulcer of right heel, stage 3 01/01/2016 Yes I89.0 Lymphedema, not elsewhere classified 01/01/2016 Yes E66.01 Morbid (severe) obesity due to excess calories 01/01/2016 Yes M70.871 Other soft tissue disorders related to use, overuse and 01/19/2016 Yes pressure, right ankle and foot 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/16/2016 Yes ulceration of heel and midfoot Inactive Problems Resolved Problems ICD-10 Code Description Active Date Resolved Date L89.322 Pressure ulcer of left buttock, stage 2 01/01/2016 01/01/2016 Electronic Signature(s) Signed: 10/17/2016 9:19:59 AM By: Evlyn KannerBritto, Shannie Kontos MD, FACS Entered By: Evlyn KannerBritto, Ercilia Bettinger on 10/17/2016 09:19:59 [name] OttoLINDLEY, Dyland V. ([id number]) Clint GuyLINDLEY, Shaylon VMarland Kitchen. ([id number]) -------------------------------------------------------------------------------- Progress Note Details Patient Name: [name] OttoLINDLEY, Gregor V. Date of Service: 10/17/2016 8:45 AM Medical Record Number: [id number] Patient Account Number: [id number] Date of Birth/Sex: 1946-08-29 30(70 y.o. Male) Treating RN: Phillis HaggisPinkerton, Debi Primary Care Physician: Oretha MilchSMITH, [name] Other Clinician: Referring Physician: Oretha MilchSMITH, [name] Treating Physician/Extender: Rudene ReBritto, Bryttani Blew Weeks in Treatment: 6941 Subjective Chief Complaint Information obtained from Patient [name] presents for  follow-up on his right heel pressure ulcer and bilateral lower extremity lymphedema and venous ulcerations. History of Present Illness (HPI) The following HPI elements were documented for the patient's wound: Location: ulcerated area on the right heel, left gluteal region and thigh and then bilateral lower extremities Quality: Patient reports experiencing a sharp pain to affected area(s). Severity: Patient states wound are getting worse. Duration: Patient has had the wound for > 12 months prior to seeking treatment at the wound center Timing: Pain in wound is constant (hurts all the time) Context: The wound appeared gradually over time Modifying Factors: Other treatment(s) tried include: he sees his heart doctor and his primary care doctor Associated Signs and Symptoms: patient has not been able to walk for over a year now 70 year old gentleman with a known history of hypertension, diabetes, obstructive sleep apnea, COPD, diastolic CHF, coronary artery disease was admitted to the hospital with sepsis from an ulcer of the right heel and was treated there in October 2016. he also has chronic bilateral lower extremity edema and lymphedema. He had received vancomycin, Zosyn and at that stage and x-rays showed hardware in the right ankle but no evidence of osteomyelitis. he was a former smoker. he was also treated with Augmentin orally for 10 days. He is either bedbound or wheelchair-bound and does not ambulate by himself. 01/19/2016 -- he has not been seen here for 3 weeks and this was because he was admitted to [facility] between 223 and 01/08/2016 for sepsis, UTI and pneumonia involving the left lung. he was treated with IV vancomycin and Zosyn and then meropenem. Was discharged home on oral Bactrim for 2 weeks none of his vascular test or x-rays were done and we will reorder these. 01/26/2016 -- he has not yet done the x-ray of his foot and is vascular tests are still  pending. I have asked him to work on these with his nursing home staff. Addendum: he has got an x-ray of the right foot done which shows that his osteopenia but no specific ostial lysis or abnormal periosteal reaction. Final impression was degenerative changes with dorsal foot soft tissue swelling. 02/16/2016 -- lower extremity arterial duplex examination shows a 50-99% stenosis of the right tibioperoneal trunk. He had biphasic flow in the right SFA, popliteal and  tibioperoneal trunk. Left-sided he had triphasic flow throughout ERMIAS, TOMEO ([id number]) 02/29/2016 -- he is awaiting his vascular opinion with Dr. Gilda Crease which is to be done on April 24 03/08/2016 -- he has not kept his appointment with Dr. Gilda Crease on April 24 and does not seem to know what happened about this. it's difficult to gauge whether he is in full control of his mental faculties as at times he is extremely rude to the nursing staff. 03/22/2016 -- the patient was seen by Dr. 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 Recommended that the patient had severe atherosclerotic changes of both lower extremities associated with ulceration and tissue loss of the foot. This is a limb threatening ischemia and the patient was recommended to undergo angiography of the lower extremity with a hope for intervention for limb salvage. Patient agreed and will proceed to angiography. He was admitted to the hospital between May 2 and 03/15/2016 - he underwent induction of a catheter into his right lower extremity and third order catheter placement with contrast injection to the right lower extremity for distal runoff. Percutaneous transluminal angioplasty of the right superficial femoral and popliteal arteries were done. He also had a right peroneal angioplasty. Patient had a postoperative hematoma and was admitted for  observation and in the next 24 hours he was very agitated and combative and had to be seen by psychiatric. He had a follow-up with Dr. Gilda Crease in 3 weeks. 04/18/2016 -- notes reviewed from the vascular office where he was seen for his postop visit after the PTA of the right SFA and popliteal arteries on 03/12/2016. He underwent an ABI which showed right ABI to be more than 1.3 and left to be more than 1 more than 1.3, great toe and PPG waveforms are decreased bilaterally. No additional intervention indicated at this time and the patient was to follow-up in 3 months with an ABI and bilateral lower extremity duplex study. 05/02/2016 -- he complains that his compression stockings are causing him a lot of discomfort and he is not happy wearing them. 05/30/2016 -- the swelling of both legs has increased again and he has had blisters which have opened out into ulcerations. 09/12/2016 -- the lymphedema pumps are still not delivered and hopefully will be done within this week. As far as his skin substitute goes we are still awaiting a response from his insurance 10-10-2016: Mr. Alen presents today and states that his left edema pumps have arrived but the he has only received treatment "5 times" in sterile arrival. He also denies using the offloading boot for the right heel but admits to having the boot. He states that staff is hesitant to assist and application of the lymphedema pumps. 10/17/2016 -- I understand he continues to take his diuretics and has been using his lymphedema pump once a day for about an hour. He cannot do it twice a day. Objective Constitutional Pulse regular. Respirations normal and unlabored. Afebrile. Vitals Time Taken: 8:42 AM, Height: 70 in, Weight: 235 lbs, BMI: 33.7, Temperature: 97.6 F, Pulse: 60 Steelman, Render V. ([id number]) bpm, Respiratory Rate: 16 breaths/min, Blood Pressure: 126/60 mmHg. Eyes Nonicteric. Reactive to light. Ears, Nose, Mouth, and  Throat Lips, teeth, and gums WNL.Marland Kitchen Moist mucosa without lesions. Neck supple and nontender. No palpable supraclavicular or cervical adenopathy. Normal sized without goiter. Respiratory WNL. No retractions.. Cardiovascular Pedal Pulses WNL. No clubbing, cyanosis or  edema. Lymphatic No adneopathy. No adenopathy. No adenopathy. Musculoskeletal Adexa without tenderness or enlargement.. Digits and nails w/o clubbing, cyanosis, infection, petechiae, ischemia, or inflammatory conditions.Marland Kitchen Psychiatric Judgement and insight Intact.. No evidence of depression, anxiety, or agitation.. General Notes: the wound on the right medial calcaneus is fairly superficial but has a lot of subcutaneous debris which I could easily washout with moist saline gauze and there is healthy granulation tissue. Both lower extremity show lymphedema with some superficial blebs and excoriation and we need to continue working on this. Sharp debridement was required today. Integumentary (Hair, Skin) No suspicious lesions. No crepitus or fluctuance. No peri-wound warmth or erythema. No masses.. Wound #10 status is Open. Original cause of wound was Blister. The wound is located on the Right,Anterior Lower Leg. The wound measures 18cm length x 27.2cm width x 0.1cm depth; 384.531cm^2 area and 38.453cm^3 volume. The wound is limited to skin breakdown. There is no tunneling or undermining noted. There is a large amount of serosanguineous drainage noted. The wound margin is distinct with the outline attached to the wound base. There is large (67-100%) pink, pale granulation within the wound bed. There is a small (1-33%) amount of necrotic tissue within the wound bed including Adherent Slough. The periwound skin appearance exhibited: Localized Edema, Moist, Hemosiderin Staining, Mottled. The periwound skin appearance did not exhibit: Callus, Crepitus, Excoriation, Fluctuance, Friable, Induration, Rash, Scarring, Dry/Scaly,  Maceration, Atrophie Blanche, Cyanosis, Ecchymosis, Pallor, Rubor, Erythema. Periwound temperature was noted as No Abnormality. The periwound has tenderness on palpation. Wound #3 status is Open. Original cause of wound was Pressure Injury. The wound is located on the Right Calcaneus. The wound measures 0.4cm length x 1.4cm width x 0.2cm depth; 0.44cm^2 area and 0.088cm^3 volume. The wound is limited to skin breakdown. There is no tunneling or undermining noted. Verdone, Reo V. ([id number]) There is a large amount of serous drainage noted. The wound margin is distinct with the outline attached to the wound base. There is medium (34-66%) pink, pale granulation within the wound bed. There is a medium (34-66%) amount of necrotic tissue within the wound bed including Adherent Slough. The periwound skin appearance exhibited: Localized Edema, Moist, Hemosiderin Staining, Mottled. The periwound skin appearance did not exhibit: Callus, Crepitus, Excoriation, Fluctuance, Friable, Induration, Rash, Scarring, Dry/Scaly, Maceration, Atrophie Blanche, Cyanosis, Ecchymosis, Pallor, Rubor, Erythema. Periwound temperature was noted as No Abnormality. Wound #9 status is Open. Original cause of wound was Gradually Appeared. The wound is located on the Left,Anterior Lower Leg. The wound measures 18cm length x 36cm width x 0.1cm depth; 508.938cm^2 area and 50.894cm^3 volume. The wound is limited to skin breakdown. There is no tunneling or undermining noted. There is a large amount of serosanguineous drainage noted. The wound margin is distinct with the outline attached to the wound base. There is large (67-100%) red granulation within the wound bed. There is a small (1-33%) amount of necrotic tissue within the wound bed including Adherent Slough. The periwound skin appearance exhibited: Localized Edema, Moist, Hemosiderin Staining, Mottled. The periwound skin appearance did not exhibit: Callus, Crepitus,  Excoriation, Fluctuance, Friable, Induration, Rash, Scarring, Dry/Scaly, Maceration, Atrophie Blanche, Cyanosis, Ecchymosis, Pallor, Rubor, Erythema. Periwound temperature was noted as No Abnormality. The periwound has tenderness on palpation. 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 - only sink bath or bed bath Wound #3 Right Calcaneus: No tub bath. - only sink bath or bed bath Wound #9 Left,Anterior Lower Leg: No tub bath. - only sink bath or bed bath Anesthetic: BARON, PARMELEE. ([id number]) Wound #10 Right,Anterior Lower Leg: Topical Lidocaine 4% cream applied to wound bed prior to debridement - for clinic use only Wound #3 Right Calcaneus: Topical Lidocaine 4% cream applied to wound bed prior to debridement - for clinic use only Wound #9 Left,Anterior Lower Leg: Topical Lidocaine 4% cream applied to wound bed prior to debridement - for clinic use only Skin Barriers/Peri-Wound Care: Wound #3 Right Calcaneus: Barrier cream - around the heel wound to reddened and irritated areas Primary Wound Dressing: Wound #10 Right,Anterior Lower Leg: Aquacel Ag Wound #3 Right Calcaneus: Hydrafera Blue Wound #9 Left,Anterior Lower Leg: Aquacel Ag Secondary Dressing: Wound #10 Right,Anterior Lower Leg: ABD pad XtraSorb Wound #3 Right Calcaneus: ABD pad Wound #9 Left,Anterior Lower Leg: ABD pad XtraSorb Dressing Change Frequency: Wound #10 Right,Anterior Lower Leg: Change dressing every week Wound #3 Right Calcaneus: Change dressing  every week Wound #9 Left,Anterior Lower Leg: Change dressing every week Follow-up Appointments: Return Appointment in 1 week. Edema Control: 3 Layer Compression System - Bilateral Compression Pump: Use compression pump on left lower extremity for 30 minutes, twice daily. Compression Pump: Use compression pump on right lower extremity for 30 minutes, twice daily. Other: - unna to anchor Off-Loading: Wound #3 Right Calcaneus: Turn and reposition every 2 hours Other: - sage boots at night and float heel when lying in bed *******lymphedema pumps to be placed on pt twice a day for one hour sessions******** Additional Orders / Instructions: Wound #3 Right Calcaneus: Increase protein intake. Medications-please add to medication list.: Wound #3 Right Calcaneus: Other: - Vitamin C, Vitamin A, Zinc, multivitamin Saltos, Isham V. ([id number]) I have recommended : 1. using Hydrofera Blue for his heel to be changed every other day. 2. he has received his lymphedema pumps which hopefully will make a difference to his overall general wound healing. he is finding it difficult to get someone to help him put these on and off at his nursing home and we will attempt to talk to the nursing supervisor there. at present he is using them once a day only. 3. We will continue with silver alginate and a 3 layer Profore. Electronic Signature(s) Signed: 10/17/2016 9:23:09 AM By: Evlyn Kanner MD, FACS Entered By: Evlyn Kanner on 10/17/2016 09:23:09 [name] ([id number]) -------------------------------------------------------------------------------- SuperBill Details Patient Name: [name] Date of Service: 10/17/2016 Medical Record Number: [id number] Patient Account Number: [id number] Date of Birth/Sex: [birth year] (70 y.o. 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

## 2016-10-24 ENCOUNTER — Encounter: Payer: Medicare Other | Admitting: Surgery

## 2016-10-24 DIAGNOSIS — E11621 Type 2 diabetes mellitus with foot ulcer: Secondary | ICD-10-CM | POA: Diagnosis not present

## 2016-10-25 NOTE — Progress Notes (Signed)
RONDLE, LOHSE ([id number]) Visit Report for 10/24/2016 Arrival Information Details Patient Name: [name] [name] [name]. Date of Service: 10/24/2016 10:45 AM Medical Record Number: [id number] Patient Account Number: [id number] Date of Birth/Sex: Oct 22, 1946 (70 y.o. Male) Treating RN: Phillis Haggis Primary Care Physician: Oretha Milch Other Clinician: Referring Physician: Oretha Milch Treating Physician/Extender: Rudene Re in Treatment: 46 Visit Information History Since Last Visit All ordered tests and consults were completed: No Patient Arrived: Wheel Chair Added or deleted any medications: No Arrival Time: 10:57 Any new allergies or adverse reactions: No Accompanied By: self Had a fall or experienced change in No Transfer Assistance: Easy[employer] activities of daily living that may affect Patient Lift risk of falls: Patient Identification Verified: Yes Signs or symptoms of abuse/neglect since last No Secondary Verification Process Yes visito Completed: Has Dressing in Place as Prescribed: Yes Patient Requires Transmission- No Has Compression in Place as Prescribed: Yes Based Precautions: Pain Present Now: No Patient Has Alerts: Yes Patient Alerts: DM II Electronic Signature(s) Signed: 10/24/2016 4:30:33 PM By: Alejandro Mulling Entered By: Alejandro Mulling on 10/24/2016 10:58:06 [name] [name] ([id number]) -------------------------------------------------------------------------------- Encounter Discharge Information Details Patient Name: [name] [name]. Date of Service: 10/24/2016 10:45 AM Medical Record Number: [id number] Patient Account Number: [id number] Date of Birth/Sex: 18-Dec-1945 (70 y.o. Male) Treating RN: Phillis Haggis Primary Care Physician: Oretha Milch Other Clinician: Referring Physician: Oretha Milch Treating Physician/Extender: Rudene Re in Treatment: 77 Encounter Discharge Information Items Discharge Pain Level: 0 Discharge  Condition: Stable Ambulatory Status: Wheelchair Discharge Destination: Nursing Home Transportation: Other Accompanied By: self Schedule Follow-up Appointment: Yes Medication Reconciliation completed and provided to Patient/Care Yes Mak Bonny: Provided on Clinical Summary of Care: 10/24/2016 Form Type Recipient Paper Patient EL Electronic Signature(s) Signed: 10/24/2016 4:30:33 PM By: Alejandro Mulling Previous Signature: 10/24/2016 12:03:21 PM Version By: Gwenlyn Perking Entered By: Alejandro Mulling on 10/24/2016 12:08:23 [name] [name] ([id number]) -------------------------------------------------------------------------------- Lower Extremity Assessment Details Patient Name: [name] [name]. Date of Service: 10/24/2016 10:45 AM Medical Record Number: [id number] Patient Account Number: [id number] Date of Birth/Sex: October 16, 1946 (70 y.o. Male) Treating RN: Ashok Cordia, Debi Primary Care Physician: Oretha Milch Other Clinician: Referring Physician: Oretha Milch Treating Physician/Extender: Rudene Re in Treatment: 42 Edema Assessment Assessed: [Left: No] [Right: No] E[Left: dema] [Right: :] Calf Left: Right: Point of Measurement: 34 cm From Medial Instep 39.6 cm 39 cm Ankle Left: Right: Point of Measurement: 12 cm From Medial Instep 26.4 cm 25.4 cm Vascular Assessment Pulses: Posterior Tibial Dorsalis Pedis Palpable: [Left:Yes] [Right:Yes] Extremity colors, hair growth, and conditions: Extremity Color: [Left:Red] [Right:Red] Temperature of Extremity: [Left:Warm] [Right:Warm] Capillary Refill: [Left:< 3 seconds] [Right:< 3 seconds] Toe Nail Assessment Left: Right: Thick: Yes Yes Discolored: Yes Yes Deformed: Yes Yes Improper Length and Hygiene: Yes Yes Electronic Signature(s) Signed: 10/24/2016 4:30:33 PM By: Alejandro Mulling Entered By: Alejandro Mulling on 10/24/2016 11:18:22 Mciver, Tee VMarland Kitchen  ([id number]) -------------------------------------------------------------------------------- Multi Wound Chart Details Patient Name: [name] [name]. Date of Service: 10/24/2016 10:45 AM Medical Record Number: [id number] Patient Account Number: [id number] Date of Birth/Sex: [birth year] (70 y.o. Male) Treating RN: Phillis Haggis Primary Care Physician: Oretha Milch Other Clinician: Referring Physician: Oretha Milch Treating Physician/Extender: Rudene Re in Treatment: 57 Vital Signs Height(in): 70 Pulse(bpm): 60 Weight(lbs): 235 Blood Pressure 138/68 (mmHg): Body Mass Index(BMI): 34 Temperature(F): 97.6 Respiratory Rate 16 (breaths/min): Photos: [10:No Photos] [3:No Photos] [9:No Photos] Wound Location: [10:Right Lower Leg - Anterior Right Calcaneus] [9:Left Lower Leg - Anterior] Wounding Event: [10:Blister] [3:Pressure Injury] [9:Gradually Appeared] Primary Etiology: [10:Lymphedema] [3:Pressure  Ulcer] [9:Diabetic Wound/Ulcer of the Lower Extremity] Secondary Etiology: [10: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] [9:Venous Leg Ulcer] Comorbid History: [10:Cataracts, Chronic Obstructive Pulmonary Disease (COPD), Sleep Disease (COPD), Sleep Disease (COPD), Sleep Apnea, Angina, Congestive Heart Failure, Congestive Heart Failure, Congestive Heart Failure, Hypertension, Type II Diabetes,  Gout, Osteoarthritis, Neuropathy Osteoarthritis, Neuropathy Osteoarthritis, Neuropathy] [3:Cataracts, Chronic Obstructive Pulmonary Apnea, Angina, Hypertension, Type II Diabetes, Gout,] [9:Cataracts, Chronic Obstructive Pulmonary Apnea, Angina,  Hypertension, Type II Diabetes, Gout,] Date Acquired: [10:08/22/2016] [3:10/31/2015] [9:08/09/2016] Weeks of Treatment: [10:9] [3:42] [9:10] Wound Status: [10:Open] [3:Open] [9:Open] Clustered Wound: [10:No] [3:No] [9:Yes] Measurements L x W x D 18x27.2x0.1 [3:0.5x2x0.2] [9:18x36x0.1] (cm) Area (cm) : [10:384.531] [3:0.785]  [9:508.938] Volume (cm) : [10:38.453] [3:0.157] [9:50.894] % Reduction in Area: [10:-32542.70%] [3:88.90%] [9:-408.20%] % Reduction in Volume: -32487.30% [3:88.90%] [9:-408.20%] Classification: [10:Partial Thickness] [3:Category/Stage III] [9:Grade 2] HBO Classification: [10:Grade 1] [3:Grade 1] [9:N/A] Exudate Amount: [10:Large] [3:Large] [9:Large] Exudate Type: [10:Serosanguineous] [3:Serous] [9:Serosanguineous] Exudate Color: [10:red, brown] [3:amber] [9:red, brown] Wound Margin: Distinct, outline attached Distinct, outline attached Distinct, outline attached Granulation Amount: Large (67-100%) Medium (34-66%) Large (67-100%) Granulation Quality: Pink, Pale Pink, Pale Red Necrotic Amount: Small (1-33%) Medium (34-66%) Small (1-33%) Exposed Structures: Fascia: No Fascia: No Fascia: No Fat: No Fat: No Fat: No Tendon: No Tendon: No Tendon: No Muscle: No Muscle: No Muscle: No Joint: No Joint: No Joint: No Bone: No Bone: No Bone: No Limited to Skin Limited to Skin Limited to Skin Breakdown Breakdown Breakdown Epithelialization: None None None Periwound Skin Texture: Edema: Yes Edema: Yes Edema: Yes Excoriation: No Excoriation: No Excoriation: No Induration: No Induration: No Induration: No Callus: No Callus: No Callus: No Crepitus: No Crepitus: No Crepitus: No Fluctuance: No Fluctuance: No Fluctuance: No Friable: No Friable: No Friable: No Rash: No Rash: No Rash: No Scarring: No Scarring: No Scarring: No Periwound Skin Moist: Yes Moist: Yes Moist: Yes Moisture: Maceration: No Maceration: No Maceration: No Dry/Scaly: No Dry/Scaly: No Dry/Scaly: No Periwound Skin Color: Hemosiderin Staining: Yes Hemosiderin Staining: Yes Hemosiderin Staining: Yes Mottled: Yes Mottled: Yes Mottled: Yes Atrophie Blanche: No Atrophie Blanche: No Atrophie Blanche: No Cyanosis: No Cyanosis: No Cyanosis: No Ecchymosis: No Ecchymosis: No Ecchymosis:  No Erythema: No Erythema: No Erythema: No Pallor: No Pallor: No Pallor: No Rubor: No Rubor: No Rubor: No Temperature: No Abnormality No Abnormality No Abnormality Tenderness on Yes No Yes Palpation: Wound Preparation: Ulcer Cleansing: Other: Ulcer Cleansing: Other: Ulcer Cleansing: Other: surg scrub and water surg scrub and water surg scrub and water Topical Anesthetic Topical Anesthetic Topical Anesthetic Applied: None Applied: Other: lidocaine Applied: None 4% Treatment Notes Electronic Signature(s) Signed: 10/24/2016 4:30:33 PM By: Alejandro MullingPinkerton, Debra Entered By: Alejandro MullingPinkerton, Debra on 10/24/2016 11:29:45 [name] OttoLINDLEY, [name] V. ([id number]) Clint GuyLINDLEY, [name] Seth BakeV. ([id number]) -------------------------------------------------------------------------------- Multi-Disciplinary Care Plan Details Patient Name: [name] OttoLINDLEY, [name] V. Date of Service: 10/24/2016 10:45 AM Medical Record Number: [id number] Patient Account Number: [id number] Date of Birth/Sex: 08-08-1946 36(70 y.o. Male) Treating RN: Phillis HaggisPinkerton, Debi Primary Care Physician: Oretha MilchSMITH, [name] Other Clinician: Referring Physician: Oretha MilchSMITH, [name] Treating Physician/Extender: Rudene ReBritto, Errol Weeks in Treatment: 8442 Active Inactive Abuse / Safety / Falls / Self Care Management Nursing Diagnoses: Potential for falls Goals: Patient will remain injury free Date Initiated: 03/01/2016 Goal Status: Active Interventions: Assess fall risk on admission and as needed Notes: Nutrition Nursing Diagnoses: Imbalanced nutrition Goals: Patient/caregiver agrees to and verbalizes understanding of need to use nutritional supplements and/or vitamins as prescribed Date Initiated: 03/01/2016 Goal Status: Active Interventions: Assess patient nutrition upon admission and as needed per policy  Notes: Orientation to the Wound Care Program Nursing Diagnoses: Knowledge deficit related to the wound healing center program Goals: Patient/caregiver will verbalize  understanding of the Wound Healing Center Program Olds, EMARI DEMMER ([id number]) Date Initiated: 03/01/2016 Goal Status: Active Interventions: Provide education on orientation to the wound center Notes: Pain, Acute or Chronic Nursing Diagnoses: Pain, acute or chronic: actual or potential Potential alteration in comfort, pain Goals: Patient will verbalize adequate pain control and receive pain control interventions during procedures as needed Date Initiated: 03/01/2016 Goal Status: Active Patient/caregiver will verbalize adequate pain control between visits Date Initiated: 03/01/2016 Goal Status: Active Interventions: Assess comfort goal upon admission Complete pain assessment as per visit requirements Notes: Pressure Nursing Diagnoses: Knowledge deficit related to causes and risk factors for pressure ulcer development Knowledge deficit related to management of pressures ulcers Goals: Patient/caregiver will verbalize risk factors for pressure ulcer development Date Initiated: 03/01/2016 Goal Status: Active Interventions: Assess offloading mechanisms upon admission and as needed Notes: Wound/Skin Impairment Nursing Diagnoses: TACOMA, MERIDA ([id number]) Impaired tissue integrity Goals: Ulcer/skin breakdown will have a volume reduction of 30% by week 4 Date Initiated: 03/01/2016 Goal Status: Active Ulcer/skin breakdown will have a volume reduction of 50% by week 8 Date Initiated: 03/01/2016 Goal Status: Active Ulcer/skin breakdown will have a volume reduction of 80% by week 12 Date Initiated: 03/01/2016 Goal Status: Active Interventions: Assess ulceration(s) every visit Notes: Electronic Signature(s) Signed: 10/24/2016 4:30:33 PM By: Alejandro Mulling Entered By: Alejandro Mulling on 10/24/2016 11:29:32 [name] [name] ([id number]) -------------------------------------------------------------------------------- Pain Assessment Details Patient Name: [name] [name]. Date of Service: 10/24/2016 10:45 AM Medical Record Number: [id number] Patient Account Number: [id number] Date of Birth/Sex: February 10, 1946 (70 y.o. Male) Treating RN: Phillis Haggis Primary Care Physician: Oretha Milch Other Clinician: Referring Physician: Oretha Milch Treating Physician/Extender: Rudene Re in Treatment: 30 Active Problems Location of Pain Severity and Description of Pain Patient Has Paino No Site Locations With Dressing Change: No Pain Management and Medication Current Pain Management: Electronic Signature(s) Signed: 10/24/2016 4:30:33 PM By: Alejandro Mulling Entered By: Alejandro Mulling on 10/24/2016 10:58:14 [name] [name] ([id number]) -------------------------------------------------------------------------------- Patient/Caregiver Education Details Patient Name: [name] [name]. Date of Service: 10/24/2016 10:45 AM Medical Record Number: [id number] Patient Account Number: [id number] Date of Birth/Gender: 07-May-1946 (70 y.o. Male) Treating RN: Phillis Haggis Primary Care Physician: Oretha Milch Other Clinician: Referring Physician: Oretha Milch Treating Physician/Extender: Rudene Re in Treatment: 28 Education Assessment Education Provided To: Patient Education Topics Provided Wound/Skin Impairment: Handouts: Other: do not get wraps wet Methods: Demonstration, Explain/Verbal Responses: State content correctly Electronic Signature(s) Signed: 10/24/2016 4:30:33 PM By: Alejandro Mulling Entered By: Alejandro Mulling on 10/24/2016 12:08:38 [name] [name] ([id number]) -------------------------------------------------------------------------------- Wound Assessment Details Patient Name: [name] [name]. Date of Service: 10/24/2016 10:45 AM Medical Record Number: [id number] Patient Account Number: [id number] Date of Birth/Sex: [birth year] (70 y.o. Male) Treating RN: Phillis Haggis Primary Care Physician: Oretha Milch Other  Clinician: Referring Physician: Oretha Milch Treating Physician/Extender: Rudene Re in Treatment: 42 Wound Status Wound Number: 10 Primary Lymphedema Etiology: Wound Location: Right Lower Leg - Anterior Secondary Diabetic Wound/Ulcer of the Lower Wounding Event: Blister Etiology: Extremity Date Acquired: 08/22/2016 Wound Open Weeks Of Treatment: 9 Status: Clustered Wound: No Comorbid Cataracts, Chronic Obstructive History: Pulmonary Disease (COPD), Sleep Apnea, Angina, Congestive Heart Failure, Hypertension, Type II Diabetes, Gout, Osteoarthritis, Neuropathy Photos Photo Uploaded By: Alejandro Mulling on 10/24/2016 12:59:37 Wound Measurements Length: (cm) 18 Width: (cm) 27.2 Depth: (cm) 0.1 Area: (cm) 384.531 Volume: (cm) 38.453 % Reduction  in Area: -32542.7% % Reduction in Volume: -32487.3% Epithelialization: None Wound Description Classification: Partial Thickness Diabetic Severity [address](Wagner): Grade 1 Wound Margin: Distinct, outline attache Exudate Amount: Large Exudate Type: Serosanguineous Exudate Color: red, brown Maclachlan, [name] V. ([id number]) Foul Odor After Cleansing: No d Wound Bed Granulation Amount: Large (67-100%) Exposed Structure Granulation Quality: Pink, Pale Fascia Exposed: No Necrotic Amount: Small (1-33%) Fat Layer Exposed: No Necrotic Quality: Adherent Slough Tendon Exposed: No Muscle Exposed: No Joint Exposed: No Bone Exposed: No Limited to Skin Breakdown Periwound Skin Texture Texture Color No Abnormalities Noted: No No Abnormalities Noted: No Callus: No Atrophie Blanche: No Crepitus: No Cyanosis: No Excoriation: No Ecchymosis: No Fluctuance: No Erythema: No Friable: No Hemosiderin Staining: Yes Induration: No Mottled: Yes Localized Edema: Yes Pallor: No Rash: No Rubor: No Scarring: No Temperature / Pain Moisture Temperature: No Abnormality No Abnormalities Noted: No Tenderness on Palpation: Yes Dry / Scaly:  No Maceration: No Moist: Yes Wound Preparation Ulcer Cleansing: Other: surg scrub and water, Topical Anesthetic Applied: None Treatment Notes Wound #10 (Right, Anterior Lower Leg) 1. Cleansed with: Clean wound with Normal Saline Cleanse wound with antibacterial soap and water 3. Peri-wound Care: Antifungal cream Other peri-wound care (specify in notes) 4. Dressing Applied: Aquacel Ag 5. Secondary Dressing Applied ABD Pad 7. Secured with Tape Hoaglin, Veldon V. ([id number]) 3 Layer Compression System - Bilateral Notes unna to anchor, TCA, xtrasorb Electronic Signature(s) Signed: 10/24/2016 4:30:33 PM By: Alejandro MullingPinkerton, Debra Entered By: Alejandro MullingPinkerton, Debra on 10/24/2016 11:20:27 [name] OttoLINDLEY, [name] V. ([id number]) -------------------------------------------------------------------------------- Wound Assessment Details Patient Name: [name] OttoLINDLEY, [name] V. Date of Service: 10/24/2016 10:45 AM Medical Record Number: [id number] Patient Account Number: [id number] Date of Birth/Sex: 12-12-1945 [age] y.o. Male) Treating RN: Phillis HaggisPinkerton, Debi Primary Care Physician: Oretha MilchSMITH, [name] Other Clinician: Referring Physician: Oretha MilchSMITH, [name] Treating Physician/Extender: Rudene ReBritto, Errol Weeks in Treatment: 42 Wound Status Wound Number: 3 Primary Pressure Ulcer Etiology: Wound Location: Right Calcaneus Wound Open Wounding Event: Pressure Injury Status: Date Acquired: 10/31/2015 Comorbid Cataracts, Chronic Obstructive Weeks Of Treatment: 42 History: Pulmonary Disease (COPD), Sleep Clustered Wound: No Apnea, Angina, Congestive Heart Failure, Hypertension, Type II Diabetes, Gout, Osteoarthritis, Neuropathy Photos Photo Uploaded By: Alejandro MullingPinkerton, Debra on 10/24/2016 12:59:37 Wound Measurements Length: (cm) 0.5 Width: (cm) 2 Depth: (cm) 0.2 Area: (cm) 0.785 Volume: (cm) 0.157 % Reduction in Area: 88.9% % Reduction in Volume: 88.9% Epithelialization: None Tunneling: No Undermining: No Wound  Description Classification: Category/Stage III Diabetic Severity [address](Wagner): Grade 1 Wound Margin: Distinct, outline attache Exudate Amount: Large Exudate Type: Serous Exudate Color: amber Foul Odor After Cleansing: No d Wound Bed Granulation Amount: Medium (34-66%) Exposed Structure [name] [name], [name] V. ([id number]) Granulation Quality: Pink, Pale Fascia Exposed: No Necrotic Amount: Medium (34-66%) Fat Layer Exposed: No Necrotic Quality: Adherent Slough Tendon Exposed: No Muscle Exposed: No Joint Exposed: No Bone Exposed: No Limited to Skin Breakdown Periwound Skin Texture Texture Color No Abnormalities Noted: No No Abnormalities Noted: No Callus: No Atrophie Blanche: No Crepitus: No Cyanosis: No Excoriation: No Ecchymosis: No Fluctuance: No Erythema: No Friable: No Hemosiderin Staining: Yes Induration: No Mottled: Yes Localized Edema: Yes Pallor: No Rash: No Rubor: No Scarring: No Temperature / Pain Moisture Temperature: No Abnormality No Abnormalities Noted: No Dry / Scaly: No Maceration: No Moist: Yes Wound Preparation Ulcer Cleansing: Other: surg scrub and water, Topical Anesthetic Applied: Other: lidocaine 4%, Treatment Notes Wound #3 (Right Calcaneus) 1. Cleansed with: Clean wound with Normal Saline Cleanse wound with antibacterial soap and water 3. Peri-wound Care: Barrier cream 4. Dressing Applied: Aquacel Ag 5.  Secondary Dressing Applied ABD Pad 7. Secured with Tape 3 Layer Compression System - Bilateral Notes unna to anchor [name] [name], [name] V. ([id number]) Electronic Signature(s) Signed: 10/24/2016 4:30:33 PM By: Alejandro Mulling Entered By: Alejandro Mulling on 10/24/2016 11:19:43 [name] [name], [name] V. ([id number]) -------------------------------------------------------------------------------- Wound Assessment Details Patient Name: [name] [name]. Date of Service: 10/24/2016 10:45 AM Medical Record Number: [id number] Patient Account  Number: [id number] Date of Birth/Sex: November 04, 1946 (70 y.o. Male) Treating RN: Phillis Haggis Primary Care Physician: Oretha Milch Other Clinician: Referring Physician: Oretha Milch Treating Physician/Extender: Rudene Re in Treatment: 42 Wound Status Wound Number: 9 Primary Diabetic Wound/Ulcer of the Lower Etiology: Extremity Wound Location: Left Lower Leg - Anterior Secondary Venous Leg Ulcer Wounding Event: Gradually Appeared Etiology: Date Acquired: 08/09/2016 Wound Open Weeks Of Treatment: 10 Status: Clustered Wound: Yes Comorbid Cataracts, Chronic Obstructive History: Pulmonary Disease (COPD), Sleep Apnea, Angina, Congestive Heart Failure, Hypertension, Type II Diabetes, Gout, Osteoarthritis, Neuropathy Photos Photo Uploaded By: Alejandro Mulling on 10/24/2016 13:00:14 Wound Measurements Length: (cm) 18 Width: (cm) 36 Depth: (cm) 0.1 Area: (cm) 508.938 Volume: (cm) 50.894 % Reduction in Area: -408.2% % Reduction in Volume: -408.2% Epithelialization: None Tunneling: No Undermining: No Wound Description Classification: Grade 2 Foul Odor Aft Wound Margin: Distinct, outline attached Exudate Amount: Large Exudate Type: Serosanguineous Exudate Color: red, brown er Cleansing: No Wound Bed [name] [name], [name] V. ([id number]) Granulation Amount: Large (67-100%) Exposed Structure Granulation Quality: Red Fascia Exposed: No Necrotic Amount: Small (1-33%) Fat Layer Exposed: No Necrotic Quality: Adherent Slough Tendon Exposed: No Muscle Exposed: No Joint Exposed: No Bone Exposed: No Limited to Skin Breakdown Periwound Skin Texture Texture Color No Abnormalities Noted: No No Abnormalities Noted: No Callus: No Atrophie Blanche: No Crepitus: No Cyanosis: No Excoriation: No Ecchymosis: No Fluctuance: No Erythema: No Friable: No Hemosiderin Staining: Yes Induration: No Mottled: Yes Localized Edema: Yes Pallor: No Rash: No Rubor: No Scarring: No  Temperature / Pain Moisture Temperature: No Abnormality No Abnormalities Noted: No Tenderness on Palpation: Yes Dry / Scaly: No Maceration: No Moist: Yes Wound Preparation Ulcer Cleansing: Other: surg scrub and water, Topical Anesthetic Applied: None Treatment Notes Wound #9 (Left, Anterior Lower Leg) 1. Cleansed with: Clean wound with Normal Saline Cleanse wound with antibacterial soap and water 3. Peri-wound Care: Antifungal cream 4. Dressing Applied: Aquacel Ag 5. Secondary Dressing Applied ABD Pad 7. Secured with Tape 3 Layer Compression System - Bilateral Notes [name] [name], [name] V. ([id number]) unna to anchor, xtrasorb, TCA Electronic Signature(s) Signed: 10/24/2016 4:30:33 PM By: Alejandro Mulling Entered By: Alejandro Mulling on 10/24/2016 11:20:48 [name] [name] ([id number]) -------------------------------------------------------------------------------- Vitals Details Patient Name: [name] [name]. Date of Service: 10/24/2016 10:45 AM Medical Record Number: [id number] Patient Account Number: [id number] Date of Birth/Sex: Jun 21, 1946 (70 y.o. Male) Treating RN: Phillis Haggis Primary Care Physician: Oretha Milch Other Clinician: Referring Physician: Oretha Milch Treating Physician/Extender: Rudene Re in Treatment: 44 Vital Signs Time Taken: 11:00 Temperature (F): 97.6 Height (in): 70 Pulse (bpm): 60 Weight (lbs): 235 Respiratory Rate (breaths/min): 16 Body Mass Index (BMI): 33.7 Blood Pressure (mmHg): 138/68 Reference Range: 80 - 120 mg / dl Electronic Signature(s) Signed: 10/24/2016 4:30:33 PM By: Alejandro Mulling Entered By: Alejandro Mulling on 10/24/2016 11:01:50

## 2016-10-25 NOTE — Progress Notes (Signed)
ALLEX, LAPOINT ([id number]) Visit Report for 10/24/2016 Chief Complaint Document Details Patient Name: [name], [name] Date of Service: 10/24/2016 10:45 AM Medical Record Number: [id number] Patient Account Number: [id number] Date of Birth/Sex: 1946/05/29 (70 y.o. Male) Treating RN: Phillis Haggis Primary Care Physician: Oretha Milch Other Clinician: Referring Physician: Oretha Milch Treating Physician/Extender: Rudene Re in Treatment: 75 Information Obtained from: Patient Chief Complaint [name] presents for follow-up on his right heel pressure ulcer and bilateral lower extremity lymphedema and venous ulcerations. Electronic Signature(s) Signed: 10/24/2016 11:43:16 AM By: Evlyn Kanner MD, FACS Entered By: Evlyn Kanner on 10/24/2016 11:43:16 [name] ([id number]) -------------------------------------------------------------------------------- Debridement Details Patient Name: [name] Date of Service: 10/24/2016 10:45 AM Medical Record Number: [id number] Patient Account Number: [id number] Date of Birth/Sex: May 29, 1946 (70 y.o. Male) Treating RN: Phillis Haggis Primary Care Physician: Oretha Milch Other Clinician: Referring Physician: Oretha Milch Treating Physician/Extender: Rudene Re in Treatment: 42 Debridement Performed for Wound #3 Right Calcaneus Assessment: Performed By: Physician Evlyn Kanner, MD Debridement: Debridement Pre-procedure Yes - 11:31 Verification/Time Out Taken: Start Time: 11:32 Pain Control: Lidocaine 4% Topical Solution Level: Skin/Subcutaneous Tissue Total Area Debrided (L x 0.5 (cm) x 2 (cm) = 1 (cm) W): Tissue and other Viable, Non-Viable, Exudate, Fibrin/Slough, Subcutaneous material debrided: Instrument: Curette Bleeding: Minimum Hemostasis Achieved: Pressure End Time: 11:33 Procedural Pain: 0 Post Procedural Pain: 0 Response to Treatment: Procedure was tolerated well Post Debridement  Measurements of Total Wound Length: (cm) 0.5 Stage: Category/Stage III Width: (cm) 2 Depth: (cm) 0.2 Volume: (cm) 0.157 Character of Wound/Ulcer Post Requires Further Debridement: Debridement Severity of Tissue Post Fat layer exposed Debridement: Post Procedure Diagnosis Same as Pre-procedure Electronic Signature(s) Signed: 10/24/2016 11:43:09 AM By: Evlyn Kanner MD, FACS Signed: 10/24/2016 4:30:33 PM By: Alejandro Mulling Previous Signature: 10/24/2016 11:42:53 AM Version By: Evlyn Kanner MD, FACS [name], [name] ([id number]) Entered By: Evlyn Kanner on 10/24/2016 11:43:09 [name] ([id number]) -------------------------------------------------------------------------------- HPI Details Patient Name: [name] Date of Service: 10/24/2016 10:45 AM Medical Record Number: [id number] Patient Account Number: [id number] Date of Birth/Sex: 06/24/1946 (70 y.o. Male) Treating RN: Phillis Haggis Primary Care Physician: Oretha Milch Other Clinician: Referring Physician: Oretha Milch Treating Physician/Extender: Rudene Re in Treatment: 83 History of Present Illness Location: ulcerated area on the right heel, left gluteal region and thigh and then bilateral lower extremities Quality: Patient reports experiencing a sharp pain to affected area(s). Severity: Patient states wound are getting worse. Duration: Patient has had the wound for > 12 months prior to seeking treatment at the wound center Timing: Pain in wound is constant (hurts all the time) Context: The wound appeared gradually over time Modifying Factors: Other treatment(s) tried include: he sees his heart doctor and his primary care doctor Associated Signs and Symptoms: patient has not been able to walk for over a year now HPI Description: 70 year old gentleman with a known history of hypertension, diabetes, obstructive sleep apnea, COPD, diastolic CHF, coronary artery disease was admitted to the  hospital with sepsis from an ulcer of the right heel and was treated there in October 2016. he also has chronic bilateral lower extremity edema and lymphedema. He had received vancomycin, Zosyn and at that stage and x-rays showed hardware in the right ankle but no evidence of osteomyelitis. he was a former smoker. he was also treated with Augmentin orally for 10 days. He is either bedbound or wheelchair-bound and does not ambulate by himself. 01/19/2016 -- he has not been seen here for 3 weeks  and this was because he was admitted to [facility] between 223 and 01/08/2016 for sepsis, UTI and pneumonia involving the left lung. he was treated with IV vancomycin and Zosyn and then meropenem. Was discharged home on oral Bactrim for 2 weeks none of his vascular test or x-rays were done and we will reorder these. 01/26/2016 -- he has not yet done the x-ray of his foot and is vascular tests are still pending. I have asked him to work on these with his nursing home staff. Addendum: he has got an x-ray of the right foot done which shows that his osteopenia but no specific ostial lysis or abnormal periosteal reaction. Final impression was degenerative changes with dorsal foot soft tissue swelling. 02/16/2016 -- lower extremity arterial duplex examination shows a 50-99% stenosis of the right tibioperoneal trunk. He had biphasic flow in the right SFA, popliteal and tibioperoneal trunk. Left-sided he had triphasic flow throughout 02/29/2016 -- he is awaiting his vascular opinion with Dr. Gilda Crease which is to be done on April 24 03/08/2016 -- he has not kept his appointment with Dr. Gilda Crease on April 24 and does not seem to know what happened about this. it's difficult to gauge whether he is in full control of his mental faculties as at times he is extremely rude to the nursing staff. 03/22/2016 -- the patient was seen by Dr. 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 Recommended that the patient had severe atherosclerotic changes of both lower extremities associated with ulceration and tissue loss of the foot. This is a limb threatening ischemia and the patient was recommended to undergo Badertscher, [name] ([id number]) angiography of the lower extremity with a hope for intervention for limb salvage. Patient agreed and will proceed to angiography. He was admitted to the hospital between May 2 and 03/15/2016 - he underwent induction of a catheter into his right lower extremity and third order catheter placement with contrast injection to the right lower extremity for distal runoff. Percutaneous transluminal angioplasty of the right superficial femoral and popliteal arteries were done. He also had a right peroneal angioplasty. Patient had a postoperative hematoma and was admitted for observation and in the next 24 hours he was very agitated and combative and had to be seen by psychiatric. He had a follow-up with Dr. Gilda Crease in 3 weeks. 04/18/2016 -- notes reviewed from the vascular office where he was seen for his postop visit after the PTA of the right SFA and popliteal arteries on 03/12/2016. He underwent an ABI which showed right ABI to be more than 1.3 and left to be more than 1 more than 1.3, great toe and PPG waveforms are decreased bilaterally. No additional intervention indicated at this time and the patient was to follow-up in 3 months with an ABI and bilateral lower extremity duplex study. 05/02/2016 -- he complains that his compression stockings are causing him a lot of discomfort and he is not happy wearing them. 05/30/2016 -- the swelling of both legs has increased again and he has had blisters which have opened out into ulcerations. 09/12/2016 -- the lymphedema pumps are still not delivered and hopefully will be done within this week. As far as his  skin substitute goes we are still awaiting a response from his insurance 10-10-2016: Mr. Strauch presents today and states that his left edema pumps have arrived but the he has only received  treatment "5 times" in sterile arrival. He also denies using the offloading boot for the right heel but admits to having the boot. He states that staff is hesitant to assist and application of the lymphedema pumps. 10/17/2016 -- I understand he continues to take his diuretics and has been using his lymphedema pump once a day for about an hour. He cannot do it twice a day. Electronic Signature(s) Signed: 10/24/2016 11:43:26 AM By: Evlyn Kanner MD, FACS Entered By: Evlyn Kanner on 10/24/2016 11:43:26 [name] ([id number]) -------------------------------------------------------------------------------- Physical Exam Details Patient Name: [name] Date of Service: 10/24/2016 10:45 AM Medical Record Number: [id number] Patient Account Number: [id number] Date of Birth/Sex: Nov 30, 1945 (70 y.o. Male) Treating RN: Phillis Haggis Primary Care Physician: Oretha Milch Other Clinician: Referring Physician: Oretha Milch Treating Physician/Extender: Rudene Re in Treatment: 42 Constitutional . Pulse regular. Respirations normal and unlabored. Afebrile. . Eyes Nonicteric. Reactive to light. Ears, Nose, Mouth, and Throat Lips, teeth, and gums WNL.Marland Kitchen Moist mucosa without lesions. Neck supple and nontender. No palpable supraclavicular or cervical adenopathy. Normal sized without goiter. Respiratory WNL. No retractions.. Breath sounds WNL, No rubs, rales, rhonchi, or wheeze.. Cardiovascular Heart rhythm and rate regular, no murmur or gallop.. Pedal Pulses WNL. No clubbing, cyanosis or edema. Chest Breasts symmetical and no nipple discharge.. Breast tissue WNL, no masses, lumps, or tenderness.. Lymphatic No adneopathy. No adenopathy. No adenopathy. Musculoskeletal Adexa without  tenderness or enlargement.. Digits and nails w/o clubbing, cyanosis, infection, petechiae, ischemia, or inflammatory conditions.. Integumentary (Hair, Skin) No suspicious lesions. No crepitus or fluctuance. No peri-wound warmth or erythema. No masses.Marland Kitchen Psychiatric Judgement and insight Intact.. No evidence of depression, anxiety, or agitation.. Notes the wounds on both lower extremities continue to have mild excoriation around it possibly from superficial ulceration and the lymphedema persist. There may also be a element of fungal infection. The wound on the right medial calcaneum has been a bit macerated and there is some subcutaneous debris which I have removed sharply with a #3 curet and bleeding controlled with pressure Electronic Signature(s) Signed: 10/24/2016 11:44:27 AM By: Evlyn Kanner MD, FACS Entered By: Evlyn Kanner on 10/24/2016 11:44:26 [name] ([id number]) -------------------------------------------------------------------------------- Physician Orders Details Patient Name: [name] Date of Service: 10/24/2016 10:45 AM Medical Record Number: [id number] Patient Account Number: [id number] Date of Birth/Sex: November 12, 1945 (70 y.o. Male) Treating RN: Phillis Haggis Primary Care Physician: Oretha Milch Other Clinician: Referring Physician: Oretha Milch Treating Physician/Extender: Rudene Re in Treatment: 1 Verbal / Phone Orders: Yes Clinician: Ashok Cordia, Debi Read Back and Verified: Yes Diagnosis Coding Wound Cleansing Wound #10 Right,Anterior Lower Leg o No tub bath. - only sink bath or bed bath Wound #3 Right Calcaneus o No tub bath. - only sink bath or bed bath Wound #9 Left,Anterior Lower Leg o No tub bath. - only sink bath or bed bath Anesthetic Wound #10 Right,Anterior Lower Leg o Topical Lidocaine 4% cream applied to wound bed prior to debridement - for clinic use only Wound #3 Right Calcaneus o Topical Lidocaine 4% cream  applied to wound bed prior to debridement - for clinic use only Wound #9 Left,Anterior Lower Leg o Topical Lidocaine 4% cream applied to wound bed prior to debridement - for clinic use only Skin Barriers/Peri-Wound Care Wound #3 Right Calcaneus o Barrier cream - around the heel wound to reddened and irritated areas Wound #10 Right,Anterior Lower Leg o Antifungal cream - Nystatin o Triamcinolone Acetonide Ointment Wound #9 Left,Anterior Lower Leg o Antifungal cream - Nystatin   o Triamcinolone Acetonide Ointment Primary Wound Dressing Wound #10 Right,Anterior Lower Leg o Aquacel Ag Wound #3 Right Calcaneus Gellerman, Nivek V. ([id number]) o Aquacel Ag Wound #9 Left,Anterior Lower Leg o Aquacel Ag Secondary Dressing Wound #10 Right,Anterior Lower Leg o ABD pad o XtraSorb Wound #3 Right Calcaneus o ABD pad Wound #9 Left,Anterior Lower Leg o ABD pad o XtraSorb Dressing Change Frequency Wound #10 Right,Anterior Lower Leg o Change dressing every week Wound #3 Right Calcaneus o Change dressing every week Wound #9 Left,Anterior Lower Leg o Change dressing every week Follow-up Appointments o Return Appointment in 1 week. Edema Control o 3 Layer Compression System - Bilateral o Compression Pump: Use compression pump on left lower extremity for 30 minutes, twice daily. o Compression Pump: Use compression pump on right lower extremity for 30 minutes, twice daily. o Other: - unna to anchor Off-Loading Wound #3 Right Calcaneus o Turn and reposition every 2 hours o Other: - sage boots at night and float heel when lying in bed *******lymphedema pumps to be placed on pt twice a day for one hour sessions******** Additional Orders / Instructions Wound #3 Right Calcaneus o Increase protein intake. OLIN, GURSKI ([id number]) Medications-please add to medication list. Wound #3 Right Calcaneus o Other: - Vitamin C, Vitamin A, Zinc,  multivitamin Electronic Signature(s) Signed: 10/24/2016 3:42:21 PM By: Evlyn Kanner MD, FACS Signed: 10/24/2016 4:30:33 PM By: Alejandro Mulling Entered By: Alejandro Mulling on 10/24/2016 11:40:32 Sicard, Henrik V. ([id number]) -------------------------------------------------------------------------------- Problem List Details Patient Name: [name] Date of Service: 10/24/2016 10:45 AM Medical Record Number: [id number] Patient Account Number: [id number] Date of Birth/Sex: 24-Aug-1946 (70 y.o. Male) Treating RN: Phillis Haggis Primary Care Physician: Oretha Milch Other Clinician: Referring Physician: Oretha Milch Treating Physician/Extender: Rudene Re in Treatment: 51 Active Problems ICD-10 Encounter Code Description Active Date Diagnosis E11.621 Type 2 diabetes mellitus with foot ulcer 01/01/2016 Yes L89.613 Pressure ulcer of right heel, stage 3 01/01/2016 Yes I89.0 Lymphedema, not elsewhere classified 01/01/2016 Yes E66.01 Morbid (severe) obesity due to excess calories 01/01/2016 Yes M70.871 Other soft tissue disorders related to use, overuse and 01/19/2016 Yes pressure, right ankle and foot 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/16/2016 Yes ulceration of heel and midfoot Inactive Problems Resolved Problems ICD-10 Code Description Active Date Resolved Date L89.322 Pressure ulcer of left buttock, stage 2 01/01/2016 01/01/2016 Electronic Signature(s) Signed: 10/24/2016 11:42:44 AM By: Evlyn Kanner MD, FACS Entered By: Evlyn Kanner on 10/24/2016 11:42:44 [name] ([id number]) [name], Lou VMarland Kitchen ([id number]) -------------------------------------------------------------------------------- Progress Note Details Patient Name: [name] Date of Service: 10/24/2016 10:45 AM Medical Record Number: [id number] Patient Account Number: [id number] Date of Birth/Sex: [birth year] (70 y.o. Male) Treating RN: Phillis Haggis Primary Care  Physician: Oretha Milch Other Clinician: Referring Physician: Oretha Milch Treating Physician/Extender: Rudene Re in Treatment: 29 Subjective Chief Complaint Information obtained from Patient [name] presents for follow-up on his right heel pressure ulcer and bilateral lower extremity lymphedema and venous ulcerations. History of Present Illness (HPI) The following HPI elements were documented for the patient's wound: Location: ulcerated area on the right heel, left gluteal region and thigh and then bilateral lower extremities Quality: Patient reports experiencing a sharp pain to affected area(s). Severity: Patient states wound are getting worse. Duration: Patient has had the wound for > 12 months prior to seeking treatment at the wound center Timing: Pain in wound is constant (hurts all the time) Context: The wound appeared gradually over time Modifying Factors: Other treatment(s) tried  include: he sees his heart doctor and his primary care doctor Associated Signs and Symptoms: patient has not been able to walk for over a year now 70 year old gentleman with a known history of hypertension, diabetes, obstructive sleep apnea, COPD, diastolic CHF, coronary artery disease was admitted to the hospital with sepsis from an ulcer of the right heel and was treated there in October 2016. he also has chronic bilateral lower extremity edema and lymphedema. He had received vancomycin, Zosyn and at that stage and x-rays showed hardware in the right ankle but no evidence of osteomyelitis. he was a former smoker. he was also treated with Augmentin orally for 10 days. He is either bedbound or wheelchair-bound and does not ambulate by himself. 01/19/2016 -- he has not been seen here for 3 weeks and this was because he was admitted to [facility] between 223 and 01/08/2016 for sepsis, UTI and pneumonia involving the left lung. he was treated with IV vancomycin and Zosyn and  then meropenem. Was discharged home on oral Bactrim for 2 weeks none of his vascular test or x-rays were done and we will reorder these. 01/26/2016 -- he has not yet done the x-ray of his foot and is vascular tests are still pending. I have asked him to work on these with his nursing home staff. Addendum: he has got an x-ray of the right foot done which shows that his osteopenia but no specific ostial lysis or abnormal periosteal reaction. Final impression was degenerative changes with dorsal foot soft tissue swelling. 02/16/2016 -- lower extremity arterial duplex examination shows a 50-99% stenosis of the right tibioperoneal trunk. He had biphasic flow in the right SFA, popliteal and tibioperoneal trunk. Left-sided he had triphasic flow throughout [name], [name] ([id number]) 02/29/2016 -- he is awaiting his vascular opinion with Dr. Gilda Crease which is to be done on April 24 03/08/2016 -- he has not kept his appointment with Dr. Gilda Crease on April 24 and does not seem to know what happened about this. it's difficult to gauge whether he is in full control of his mental faculties as at times he is extremely rude to the nursing staff. 03/22/2016 -- the patient was seen by Dr. 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 Recommended that the patient had severe atherosclerotic changes of both lower extremities associated with ulceration and tissue loss of the foot. This is a limb threatening ischemia and the patient was recommended to undergo angiography of the lower extremity with a hope for intervention for limb salvage. Patient agreed and will proceed to angiography. He was admitted to the hospital between May 2 and 03/15/2016 - he underwent induction of a catheter into his right lower extremity and third order catheter placement with contrast injection to the right lower extremity for  distal runoff. Percutaneous transluminal angioplasty of the right superficial femoral and popliteal arteries were done. He also had a right peroneal angioplasty. Patient had a postoperative hematoma and was admitted for observation and in the next 24 hours he was very agitated and combative and had to be seen by psychiatric. He had a follow-up with Dr. Gilda Crease in 3 weeks. 04/18/2016 -- notes reviewed from the vascular office where he was seen for his postop visit after the PTA of the right SFA and popliteal arteries on 03/12/2016. He underwent an ABI which showed right ABI to be more than 1.3 and left to  be more than 1 more than 1.3, great toe and PPG waveforms are decreased bilaterally. No additional intervention indicated at this time and the patient was to follow-up in 3 months with an ABI and bilateral lower extremity duplex study. 05/02/2016 -- he complains that his compression stockings are causing him a lot of discomfort and he is not happy wearing them. 05/30/2016 -- the swelling of both legs has increased again and he has had blisters which have opened out into ulcerations. 09/12/2016 -- the lymphedema pumps are still not delivered and hopefully will be done within this week. As far as his skin substitute goes we are still awaiting a response from his insurance 10-10-2016: Mr. [name] presents today and states that his left edema pumps have arrived but the he has only received treatment "5 times" in sterile arrival. He also denies using the offloading boot for the right heel but admits to having the boot. He states that staff is hesitant to assist and application of the lymphedema pumps. 10/17/2016 -- I understand he continues to take his diuretics and has been using his lymphedema pump once a day for about an hour. He cannot do it twice a day. Objective Constitutional Pulse regular. Respirations normal and unlabored. Afebrile. Vitals Time Taken: 11:00 AM, Height: 70 in, Weight: 235  lbs, BMI: 33.7, Temperature: 97.6 F, Pulse: 60 [name], [name] ([id number]) bpm, Respiratory Rate: 16 breaths/min, Blood Pressure: 138/68 mmHg. Eyes Nonicteric. Reactive to light. Ears, Nose, Mouth, and Throat Lips, teeth, and gums WNL.Marland Kitchen. Moist mucosa without lesions. Neck supple and nontender. No palpable supraclavicular or cervical adenopathy. Normal sized without goiter. Respiratory WNL. No retractions.. Breath sounds WNL, No rubs, rales, rhonchi, or wheeze.. Cardiovascular Heart rhythm and rate regular, no murmur or gallop.. Pedal Pulses WNL. No clubbing, cyanosis or edema. Chest Breasts symmetical and no nipple discharge.. Breast tissue WNL, no masses, lumps, or tenderness.. Lymphatic No adneopathy. No adenopathy. No adenopathy. Musculoskeletal Adexa without tenderness or enlargement.. Digits and nails w/o clubbing, cyanosis, infection, petechiae, ischemia, or inflammatory conditions.Marland Kitchen. Psychiatric Judgement and insight Intact.. No evidence of depression, anxiety, or agitation.. General Notes: the wounds on both lower extremities continue to have mild excoriation around it possibly from superficial ulceration and the lymphedema persist. There may also be a element of fungal infection. The wound on the right medial calcaneum has been a bit macerated and there is some subcutaneous debris which I have removed sharply with a #3 curet and bleeding controlled with pressure Integumentary (Hair, Skin) No suspicious lesions. No crepitus or fluctuance. No peri-wound warmth or erythema. No masses.. Wound #10 status is Open. Original cause of wound was Blister. The wound is located on the Right,Anterior Lower Leg. The wound measures 18cm length x 27.2cm width x 0.1cm depth; 384.531cm^2 area and 38.453cm^3 volume. The wound is limited to skin breakdown. There is a large amount of serosanguineous drainage noted. The wound margin is distinct with the outline attached to the wound base. There is  large (67-100%) pink, pale granulation within the wound bed. There is a small (1-33%) amount of necrotic tissue within the wound bed including Adherent Slough. The periwound skin appearance exhibited: Localized Edema, Moist, Hemosiderin Staining, Mottled. The periwound skin appearance did not exhibit: Callus, Crepitus, Excoriation, Fluctuance, Friable, Induration, Rash, Scarring, Dry/Scaly, Maceration, Atrophie Blanche, Cyanosis, Ecchymosis, Pallor, Rubor, Erythema. Periwound temperature was noted as No Abnormality. The periwound has tenderness on palpation. [name], [name] ([id number]) Wound #3 status is Open. Original cause of wound was Pressure Injury. The  wound is located on the Right Calcaneus. The wound measures 0.5cm length x 2cm width x 0.2cm depth; 0.785cm^2 area and 0.157cm^3 volume. The wound is limited to skin breakdown. There is no tunneling or undermining noted. There is a large amount of serous drainage noted. The wound margin is distinct with the outline attached to the wound base. There is medium (34-66%) pink, pale granulation within the wound bed. There is a medium (34-66%) amount of necrotic tissue within the wound bed including Adherent Slough. The periwound skin appearance exhibited: Localized Edema, Moist, Hemosiderin Staining, Mottled. The periwound skin appearance did not exhibit: Callus, Crepitus, Excoriation, Fluctuance, Friable, Induration, Rash, Scarring, Dry/Scaly, Maceration, Atrophie Blanche, Cyanosis, Ecchymosis, Pallor, Rubor, Erythema. Periwound temperature was noted as No Abnormality. Wound #9 status is Open. Original cause of wound was Gradually Appeared. The wound is located on the Left,Anterior Lower Leg. The wound measures 18cm length x 36cm width x 0.1cm depth; 508.938cm^2 area and 50.894cm^3 volume. The wound is limited to skin breakdown. There is no tunneling or undermining noted. There is a large amount of serosanguineous drainage noted. The wound  margin is distinct with the outline attached to the wound base. There is large (67-100%) red granulation within the wound bed. There is a small (1-33%) amount of necrotic tissue within the wound bed including Adherent Slough. The periwound skin appearance exhibited: Localized Edema, Moist, Hemosiderin Staining, Mottled. The periwound skin appearance did not exhibit: Callus, Crepitus, Excoriation, Fluctuance, Friable, Induration, Rash, Scarring, Dry/Scaly, Maceration, Atrophie Blanche, Cyanosis, Ecchymosis, Pallor, Rubor, Erythema. Periwound temperature was noted as No Abnormality. The periwound has tenderness on palpation. 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 There was a Skin/Subcutaneous Tissue Debridement (74259-56387) debridement with total area of 1 sq cm performed by Evlyn Kanner, MD. with the following instrument(s): Curette to remove Viable and Non-Viable tissue/material including Exudate, Fibrin/Slough, and Subcutaneous after achieving pain control using Lidocaine 4% Topical Solution. A time [name], [name] ([id number]) out was conducted at 11:31, prior to the start of the procedure. A Minimum amount of bleeding was controlled with Pressure. The procedure was tolerated well with a pain level of 0 throughout and a pain level of 0 following the procedure. Post Debridement Measurements: 0.5cm length x 2cm width x 0.2cm depth; 0.157cm^3 volume. Post debridement Stage noted as Category/Stage III. Character of Wound/Ulcer  Post Debridement requires further debridement. Severity of Tissue Post Debridement is: Fat layer exposed. Post procedure Diagnosis Wound #3: Same as Pre-Procedure Plan Wound Cleansing: Wound #10 Right,Anterior Lower Leg: No tub bath. - only sink bath or bed bath Wound #3 Right Calcaneus: No tub bath. - only sink bath or bed bath Wound #9 Left,Anterior Lower Leg: No tub bath. - only sink bath or bed bath Anesthetic: Wound #10 Right,Anterior Lower Leg: Topical Lidocaine 4% cream applied to wound bed prior to debridement - for clinic use only Wound #3 Right Calcaneus: Topical Lidocaine 4% cream applied to wound bed prior to debridement - for clinic use only Wound #9 Left,Anterior Lower Leg: Topical Lidocaine 4% cream applied to wound bed  prior to debridement - for clinic use only Skin Barriers/Peri-Wound Care: Wound #3 Right Calcaneus: Barrier cream - around the heel wound to reddened and irritated areas Wound #10 Right,Anterior Lower Leg: Antifungal cream - Nystatin Triamcinolone Acetonide Ointment Wound #9 Left,Anterior Lower Leg: Antifungal cream - Nystatin Triamcinolone Acetonide Ointment Primary Wound Dressing: Wound #10 Right,Anterior Lower Leg: Aquacel Ag Wound #3 Right Calcaneus: Aquacel Ag Wound #9 Left,Anterior Lower Leg: Aquacel Ag Secondary Dressing: Wound #10 Right,Anterior Lower Leg: ABD pad XtraSorb Wound #3 Right Calcaneus: Mikkelsen, Leif V. ([id number]) ABD pad Wound #9 Left,Anterior Lower Leg: ABD pad XtraSorb Dressing Change Frequency: Wound #10 Right,Anterior Lower Leg: Change dressing every week Wound #3 Right Calcaneus: Change dressing every week Wound #9 Left,Anterior Lower Leg: Change dressing every week Follow-up Appointments: Return Appointment in 1 week. Edema Control: 3 Layer Compression System - Bilateral Compression Pump: Use compression pump on left lower extremity for 30 minutes, twice daily. Compression Pump: Use compression pump on  right lower extremity for 30 minutes, twice daily. Other: - unna to anchor Off-Loading: Wound #3 Right Calcaneus: Turn and reposition every 2 hours Other: - sage boots at night and float heel when lying in bed *******lymphedema pumps to be placed on pt twice a day for one hour sessions******** Additional Orders / Instructions: Wound #3 Right Calcaneus: Increase protein intake. Medications-please add to medication list.: Wound #3 Right Calcaneus: Other: - Vitamin C, Vitamin A, Zinc, multivitamin I have recommended : 1. using Silver alginate and Drawtex for his heel to be changed every week 2. he has received his lymphedema pumps which hopefully will make a difference to his overall general wound healing. he is finding it difficult to get someone to help him put these on and off at his nursing home and we will attempt to talk to the nursing supervisor there. at present he is using them once a day only. 3. We will continue with silver alginate and a 3 layer Profore. Electronic Signature(s) Signed: 10/24/2016 11:45:16 AM By: Evlyn KannerBritto, Emanuela Runnion MD, FACS Entered By: Evlyn KannerBritto, Viera Okonski on 10/24/2016 11:45:16 [name] OttoLINDLEY, [name] ([id number]) [name], Pedram Seth BakeV. ([id number]) -------------------------------------------------------------------------------- SuperBill Details Patient Name: [name] OttoLINDLEY, [name] Date of Service: 10/24/2016 Medical Record Number: [id number] Patient Account Number: [id number] Date of Birth/Sex: Jun 10, 1946 [age] y.o. 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 Facility Procedures CPT4 Code: [id number] Description: 11042 - DEB SUBQ TISSUE 20 SQ CM/< ICD-10 Description Diagnosis E11.621 Type 2 diabetes mellitus with foot ulcer L89.613 Pressure ulcer of right heel, stage 3 I89.0 Lymphedema, not elsewhere classified Modifier: Quantity: 1 Physician Procedures CPT4 Code: [id number] Description: 11042 - WC PHYS SUBQ TISS 20 SQ CM ICD-10 Description Diagnosis E11.621 Type 2 diabetes mellitus with foot ulcer L89.613 Pressure ulcer of right heel, stage 3 I89.0 Lymphedema, not elsewhere classified Modifier: Quantity: 1 Electronic Signature(s) Signed: 10/24/2016 11:45:29 AM By: Evlyn KannerBritto, Barb Shear MD, FACS Entered By: Evlyn KannerBritto, Jw Covin on 10/24/2016 11:45:28

## 2016-10-31 ENCOUNTER — Encounter: Payer: Medicare Other | Admitting: Nurse Practitioner

## 2016-10-31 DIAGNOSIS — E11621 Type 2 diabetes mellitus with foot ulcer: Secondary | ICD-10-CM | POA: Diagnosis not present

## 2016-11-05 DIAGNOSIS — E11621 Type 2 diabetes mellitus with foot ulcer: Secondary | ICD-10-CM | POA: Diagnosis not present

## 2016-11-05 NOTE — Progress Notes (Addendum)
[name], Amore V. ([id number]) Visit Report for 11/05/2016 Arrival Information Details Patient Name: [name], Khristian V. Date of Service: 11/05/2016 2:45 PM Medical Record Number: [id number] Patient Account Number: [id number] Date of Birth/Sex: [birth year] [age] y.o. Male) Treating RN: Huel CoventryWoody, Kim Primary Care Physician: Oretha MilchSMITH, [name] Other Clinician: Referring Physician: Oretha MilchSMITH, [name] Treating Physician/Extender: Tania AdeWeeks in Treatment: 3344 Visit Information History Since Last Visit Added or deleted any medications: No Patient Arrived: Wheel Chair Any new allergies or adverse reactions: No Arrival Time: 15:26 Had a fall or experienced change in No activities of daily living that may affect Accompanied By: self risk of falls: Transfer Assistance: Manual Signs or symptoms of abuse/neglect since last No Patient Identification Verified: Yes visito Secondary Verification Process Yes Hospitalized since last visit: No Completed: Has Dressing in Place as Prescribed: Yes Patient Requires Transmission-Based No Has Compression in Place as Prescribed: Yes Precautions: Pain Present Now: No Patient Has Alerts: Yes Patient Alerts: DM II Electronic Signature(s) Signed: 11/12/2016 11:34:03 AM By: Elliot GurneyWoody, RN, [name], Kim RN, [name] Previous Signature: 11/07/2016 2:13:37 PM Version By: Elliot GurneyWoody, RN, [name], Kim RN, [name] Previous Signature: 11/05/2016 4:02:46 PM Version By: Elliot GurneyWoody, RN, [name], Kim RN, [name] Entered By: Elliot GurneyWoody, RN, [name], Kim on 11/12/2016 10:21:52 [name], Norval V. ([id number]) -------------------------------------------------------------------------------- Clinic Level of Care Assessment Details Patient Name: [name], Keyron V. Date of Service: 11/05/2016 2:45 PM Medical Record Number: [id number] Patient Account Number: [id number] Date of Birth/Sex: [birth year] [age] y.o. Male) Treating RN: Huel CoventryWoody, Kim Primary Care Physician: Oretha MilchSMITH, [name] Other Clinician: Referring Physician: Oretha MilchSMITH, [name] Treating  Physician/Extender: Tania AdeWeeks in Treatment: 6344 Clinic Level of Care Assessment Items TOOL 1 Quantity Score []  - Use when EandM and Procedure is performed on INITIAL visit 0 ASSESSMENTS - Nursing Assessment / Reassessment []  - General Physical Exam (combine w/ comprehensive assessment (listed just 0 below) when performed on new pt. evals) []  - Comprehensive Assessment (HX, ROS, Risk Assessments, Wounds Hx, etc.) 0 ASSESSMENTS - Wound and Skin Assessment / Reassessment []  - Dermatologic / Skin Assessment (not related to wound area) 0 ASSESSMENTS - Ostomy and/or Continence Assessment and Care []  - Incontinence Assessment and Management 0 []  - Ostomy Care Assessment and Management (repouching, etc.) 0 PROCESS - Coordination of Care []  - Simple Patient / Family Education for ongoing care 0 []  - Complex (extensive) Patient / Family Education for ongoing care 0 []  - Staff obtains ChiropractorConsents, Records, Test Results / Process Orders 0 []  - Staff telephones HHA, Nursing Homes / Clarify orders / etc 0 []  - Routine Transfer to another Facility (non-emergent condition) 0 []  - Routine Hospital Admission (non-emergent condition) 0 []  - New Admissions / Manufacturing engineernsurance Authorizations / Ordering NPWT, Apligraf, etc. 0 []  - Emergency Hospital Admission (emergent condition) 0 PROCESS - Special Needs []  - Pediatric / Minor Patient Management 0 []  - Isolation Patient Management 0 Mozingo, Kylyn V. ([id number]) []  - Hearing / Language / Visual special needs 0 []  - Assessment of Community assistance (transportation, D/C planning, etc.) 0 []  - Additional assistance / Altered mentation 0 []  - Support Surface(s) Assessment (bed, cushion, seat, etc.) 0 INTERVENTIONS - Miscellaneous []  - External ear exam 0 []  - Patient Transfer (multiple staff / Nurse, adultHoyer Lift / Similar devices) 0 []  - Simple Staple / Suture removal (25 or less) 0 []  - Complex Staple / Suture removal (26 or more) 0 []  - Hypo/Hyperglycemic Management (do not  check if billed separately) 0 []  - Ankle / Brachial Index (ABI) - do not check if billed separately 0 Has the  patient been seen at the hospital within the last three years: Yes Total Score: 0 Level Of Care: ____ Electronic Signature(s) Signed: 11/07/2016 2:13:37 PM By: Elliot GurneyWoody, RN, [name], Kim RN, [name] Entered By: Elliot GurneyWoody, RN, [name], Kim on 11/07/2016 14:13:03 [name], Francisca V. ([id number]) -------------------------------------------------------------------------------- Compression Therapy Details Patient Name: [name], Camron V. Date of Service: 11/05/2016 2:45 PM Medical Record Patient Account Number: [id number] Number: Treating RN: Huel CoventryWoody, Kim [birth year] (70 y.o. Other Clinician: Date of Birth/Sex: Male) Treating ROBSON, [name] Primary Care Physician: Oretha MilchSMITH, [name] Physician/Extender: G Referring Physician: Oretha MilchSMITH, [name] Weeks in Treatment: 11044 Compression Therapy Performed for Wound Wound #9 Left,Circumferential Lower Leg Assessment: Performed By: Clinician Huel CoventryWoody, Kim, RN Compression Type: Three Emergency planning/management officerLayer Electronic Signature(s) Signed: 11/05/2016 4:02:46 PM By: Elliot GurneyWoody, RN, [name], Kim RN, [name] Entered By: Elliot GurneyWoody, RN, [name], Kim on 11/05/2016 15:55:53 [name], Alter V. ([id number]) -------------------------------------------------------------------------------- Compression Therapy Details Patient Name: [name], Omarius V. Date of Service: 11/05/2016 2:45 PM Medical Record Patient Account Number: [id number] Number: Treating RN: Huel CoventryWoody, Kim [birth year] (70 y.o. Other Clinician: Date of Birth/Sex: Male) Treating ROBSON, [name] Primary Care Physician: Oretha MilchSMITH, [name] Physician/Extender: G Referring Physician: Oretha MilchSMITH, [name] Weeks in Treatment: 4744 Compression Therapy Performed for Wound Wound #10 Right,Anterior Lower Leg Assessment: Performed By: Clinician Huel CoventryWoody, Kim, RN Compression Type: Three Emergency planning/management officerLayer Electronic Signature(s) Signed: 11/05/2016 4:02:46 PM By: Elliot GurneyWoody, RN, [name], Kim RN,  [name] Entered By: Elliot GurneyWoody, RN, [name], Kim on 11/05/2016 15:55:53 [name], Jermy V. ([id number]) -------------------------------------------------------------------------------- Compression Therapy Details Patient Name: [name], Mohammed V. Date of Service: 11/05/2016 2:45 PM Medical Record Patient Account Number: [id number] Number: Treating RN: Huel CoventryWoody, Kim [birth year] (70 y.o. Other Clinician: Date of Birth/Sex: Male) Treating ROBSON, [name] Primary Care Physician: Oretha MilchSMITH, [name] Physician/Extender: G Referring Physician: Oretha MilchSMITH, [name] Weeks in Treatment: 8244 Compression Therapy Performed for Wound Wound #3 Right Calcaneus Assessment: Performed By: Clinician Huel CoventryWoody, Kim, RN Compression Type: Three Emergency planning/management officerLayer Electronic Signature(s) Signed: 11/05/2016 4:02:46 PM By: Elliot GurneyWoody, RN, [name], Kim RN, [name] Entered By: Elliot GurneyWoody, RN, [name], Kim on 11/05/2016 15:55:53 [name], Jami V. ([id number]) -------------------------------------------------------------------------------- Encounter Discharge Information Details Patient Name: [name], Yao V. Date of Service: 11/05/2016 2:45 PM Medical Record Number: [id number] Patient Account Number: [id number] Date of Birth/Sex: [birth year] [age] y.o. Male) Treating RN: Huel CoventryWoody, Kim Primary Care Physician: Oretha MilchSMITH, [name] Other Clinician: Referring Physician: Oretha MilchSMITH, [name] Treating Physician/Extender: Tania AdeWeeks in Treatment: 6344 Encounter Discharge Information Items Discharge Pain Level: 0 Discharge Condition: Stable Ambulatory Status: Wheelchair Nursing Discharge Destination: Home Transportation: Private Auto Accompanied By: self Schedule Follow-up Appointment: Yes Medication Reconciliation completed Yes and provided to Patient/Care Lolamae Voisin: Clinical Summary of Care: Electronic Signature(s) Signed: 11/07/2016 2:13:37 PM By: Elliot GurneyWoody, RN, [name], Kim RN, [name] Previous Signature: 11/05/2016 4:02:46 PM Version By: Elliot GurneyWoody, RN, [name], Kim RN, [name] Entered By: Elliot GurneyWoody, RN, [name], Kim on  11/07/2016 14:12:55 [name], Samba V. ([id number]) -------------------------------------------------------------------------------- Patient/Caregiver Education Details Patient Name: [name], Kaysan V. Date of Service: 11/05/2016 2:45 PM Medical Record Number: [id number] Patient Account Number: [id number] Date of Birth/Gender: [birth year] [age] y.o. Male) Treating RN: Huel CoventryWoody, Kim Primary Care Physician: Oretha MilchSMITH, [name] Other Clinician: Referring Physician: Oretha MilchSMITH, [name] Treating Physician/Extender: Tania AdeWeeks in Treatment: 7144 Education Assessment Education Provided To: Patient Education Topics Provided Venous: Handouts: Controlling Swelling with Multilayered Compression Wraps Methods: Explain/Verbal Responses: State content correctly Wound/Skin Impairment: Electronic Signature(s) Signed: 11/07/2016 2:13:37 PM By: Elliot GurneyWoody, RN, [name], Kim RN, [name] Previous Signature: 11/05/2016 4:02:46 PM Version By: Elliot GurneyWoody, RN, [name], Kim RN, [name] Entered By: Elliot GurneyWoody, RN, [name], Kim on 11/07/2016 14:12:48 [name], Bosten V. ([id number]) -------------------------------------------------------------------------------- Wound Assessment Details  Patient Name: TEOMAN, GIRAUD. Date of Service: 11/05/2016 2:45 PM Medical Record Patient Account Number: [id number] Number: Treating RN: Huel Coventry 11-Jan-1946 (70 y.o. Other Clinician: Date of Birth/Sex: Male) Treating ROBSON, [name] Primary Care Physician: Oretha Milch Physician/Extender: G Referring Physician: Oretha Milch Weeks in Treatment: 44 Wound Status Wound Number: 10 Primary Lymphedema Etiology: Wound Location: Right, Anterior Lower Leg Secondary Diabetic Wound/Ulcer of the Wounding Event: Blister Etiology: Lower Extremity Date Acquired: 08/22/2016 Wound Status: Open Weeks Of Treatment: 10 Clustered Wound: No Photos Photo Uploaded By: Elliot Gurney, RN, [name], Kim on 11/05/2016 16:00:22 Wound Measurements Length: (cm) 17 Width: (cm) 21.5 Depth: (cm) 0.1 Area:  (cm) 287.063 Volume: (cm) 28.706 % Reduction in Area: -24268.7% % Reduction in Volume: -24227.1% Wound Description Classification: Partial Thickness Periwound Skin Texture Texture Color No Abnormalities Noted: No No Abnormalities Noted: No Moisture No Abnormalities Noted: No Electronic Signature(s) Signed: 11/05/2016 4:02:46 PM By: Elliot Gurney, RN, [name], Kim RN, [name] Entered By: Elliot Gurney, RN, [name], Kim on 11/05/2016 15:38:16 [name], Montie Seth Bake ([id number]) [name], Lux VMarland Kitchen ([id number]) -------------------------------------------------------------------------------- Wound Assessment Details Patient Name: [name] Otto. Date of Service: 11/05/2016 2:45 PM Medical Record Patient Account Number: [id number] Number: Treating RN: Huel Coventry Feb 16, 1946 (70 y.o. Other Clinician: Date of Birth/Sex: Male) Treating ROBSON, [name] Primary Care Physician: Oretha Milch Physician/Extender: G Referring Physician: Oretha Milch Weeks in Treatment: 44 Wound Status Wound Number: 3 Primary Etiology: Pressure Ulcer Wound Location: Right Calcaneus Wound Status: Open Wounding Event: Pressure Injury Date Acquired: 10/31/2015 Weeks Of Treatment: 44 Clustered Wound: No Photos Photo Uploaded By: Elliot Gurney, RN, [name], Kim on 11/05/2016 16:00:22 Wound Measurements Length: (cm) 0.6 Width: (cm) 2.2 Depth: (cm) 0.2 Area: (cm) 1.037 Volume: (cm) 0.207 % Reduction in Area: 85.3% % Reduction in Volume: 85.4% Wound Description Classification: Category/Stage III Periwound Skin Texture Texture Color No Abnormalities Noted: No No Abnormalities Noted: No Moisture No Abnormalities Noted: No Electronic Signature(s) Signed: 11/05/2016 4:02:46 PM By: Elliot Gurney, RN, [name], Kim RN, [name] Entered By: Elliot Gurney, RN, [name], Kim on 11/05/2016 15:38:16 [name], Rykar Seth Bake ([id number]) [name], Rahshawn VMarland Kitchen ([id number]) -------------------------------------------------------------------------------- Wound Assessment  Details Patient Name: [name] Otto. Date of Service: 11/05/2016 2:45 PM Medical Record Patient Account Number: [id number] Number: Treating RN: Huel Coventry 11-05-1946 (70 y.o. Other Clinician: Date of Birth/Sex: Male) Treating ROBSON, [name] Primary Care Physician: Oretha Milch Physician/Extender: G Referring Physician: Oretha Milch Weeks in Treatment: 44 Wound Status Wound Number: 9 Primary Diabetic Wound/Ulcer of the Etiology: Lower Extremity Wound Location: Left, Circumferential Lower Leg Secondary Venous Leg Ulcer Wounding Event: Gradually Appeared Etiology: Date Acquired: 08/09/2016 Wound Status: Open Weeks Of Treatment: 11 Clustered Wound: Yes Photos Photo Uploaded By: Elliot Gurney, RN, [name], Kim on 11/05/2016 16:00:22 Wound Measurements Length: (cm) 21 Width: (cm) 28 Depth: (cm) 0.1 Area: (cm) 461.814 Volume: (cm) 46.181 % Reduction in Area: -361.2% % Reduction in Volume: -361.2% Wound Description Classification: Grade 2 Periwound Skin Texture Texture Color No Abnormalities Noted: No No Abnormalities Noted: No Moisture No Abnormalities Noted: No Electronic Signature(s) Signed: 11/05/2016 4:02:46 PM By: Elliot Gurney, RN, [name], Kim RN, [name] Entered By: Elliot Gurney, RN, [name], Kim on 11/05/2016 15:38:16 [name], Philopater V. ([id number])

## 2016-11-08 ENCOUNTER — Encounter: Payer: Medicare Other | Admitting: Surgery

## 2016-11-08 DIAGNOSIS — E11621 Type 2 diabetes mellitus with foot ulcer: Secondary | ICD-10-CM | POA: Diagnosis not present

## 2016-11-09 NOTE — Progress Notes (Signed)
RISHAWN, WALCK ([id number]) Visit Report for 11/08/2016 Arrival Information Details Patient Name: [name], [name] Date of Service: 11/08/2016 3:00 PM Medical Record Number: [id number] Patient Account Number: [id number] Date of Birth/Sex: 04-28-1946 (70 y.o. Male) Treating RN: [name] Primary Care Physician: Oretha Milch Other Clinician: Referring Physician: Oretha Milch Treating Physician/Extender: Rudene Re in Treatment: 70 Visit Information History Since Last Visit Added or deleted any medications: No Patient Arrived: Wheel Chair Any new allergies or adverse reactions: No Arrival Time: 15:03 Had a fall or experienced change in No activities of daily living that may affect Accompanied By: self risk of falls: Transfer Assistance: None Signs or symptoms of abuse/neglect since last No Patient Identification Verified: Yes visito Secondary Verification Process Yes Hospitalized since last visit: No Completed: Pain Present Now: No Patient Requires Transmission-Based No Precautions: Patient Has Alerts: Yes Patient Alerts: DM II Electronic Signature(s) Signed: 11/08/2016 6:06:14 PM By: [name] Entered By: [name] on 11/08/2016 15:03:26 [name] ([id number]) -------------------------------------------------------------------------------- Encounter Discharge Information Details Patient Name: [name] Date of Service: 11/08/2016 3:00 PM Medical Record Number: [id number] Patient Account Number: [id number] Date of Birth/Sex: March 01, 1946 (70 y.o. Male) Treating RN: [name] Primary Care Physician: Oretha Milch Other Clinician: Referring Physician: Oretha Milch Treating Physician/Extender: Rudene Re in Treatment: 65 Encounter Discharge Information Items Discharge Pain Level: 0 Discharge Condition: Stable Ambulatory Status: Wheelchair Discharge Destination: Nursing Home Transportation: Private Auto Accompanied By:  self Schedule Follow-up Appointment: Yes Medication Reconciliation completed and provided to Patient/Care No [name] Laster: Provided on Clinical Summary of Care: 11/08/2016 Form Type Recipient Paper Patient EL Electronic Signature(s) Signed: 11/08/2016 4:56:42 PM By: [name] Previous Signature: 11/08/2016 4:21:24 PM Version By: Gwenlyn Perking Entered By: [name] on 11/08/2016 16:56:42 Beckstrand, Mccormick Seth Bake ([id number]) -------------------------------------------------------------------------------- Lower Extremity Assessment Details Patient Name: [name] Date of Service: 11/08/2016 3:00 PM Medical Record Number: [id number] Patient Account Number: [id number] Date of Birth/Sex: December 06, 1945 (70 y.o. Male) Treating RN: [name] Primary Care Physician: Oretha Milch Other Clinician: Referring Physician: Oretha Milch Treating Physician/Extender: Rudene Re in Treatment: 78 Edema Assessment Assessed: [Left: No] [Right: No] Edema: [Left: Yes] [Right: Yes] Calf Left: Right: Point of Measurement: 34 cm From Medial Instep 41.5 cm 38.9 cm Ankle Left: Right: Point of Measurement: 12 cm From Medial Instep 27.2 cm 25.3 cm Vascular Assessment Pulses: Dorsalis Pedis Palpable: [Left:Yes] [Right:Yes] Posterior Tibial Extremity colors, hair growth, and conditions: Extremity Color: [Left:Hyperpigmented] [Right:Hyperpigmented] Hair Growth on Extremity: [Left:No] [Right:No] Temperature of Extremity: [Left:Warm] [Right:Warm] Capillary Refill: [Left:< 3 seconds] [Right:< 3 seconds] Electronic Signature(s) Signed: 11/08/2016 6:06:14 PM By: [name] Entered By: [name] on 11/08/2016 15:11:57 Lebleu, Kobe VMarland Kitchen ([id number]) -------------------------------------------------------------------------------- Multi Wound Chart Details Patient Name: [name] Date of Service: 11/08/2016 3:00 PM Medical Record Number: [id number] Patient Account Number:  [id number] Date of Birth/Sex: 03-Feb-1946 (70 y.o. Male) Treating RN: [name] Primary Care Physician: Oretha Milch Other Clinician: Referring Physician: Oretha Milch Treating Physician/Extender: Rudene Re in Treatment: 2 Vital Signs Height(in): 70 Pulse(bpm): 77 Weight(lbs): 235 Blood Pressure 157/74 (mmHg): Body Mass Index(BMI): 34 Temperature(F): 97.9 Respiratory Rate 18 (breaths/min): Photos: Wound Location: Right Lower Leg - Anterior Right Calcaneus Left Lower Leg - Circumfernential Wounding Event: Blister Pressure Injury Gradually Appeared Primary Etiology: Lymphedema Pressure Ulcer 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 Comorbid History: Cataracts, Chronic Cataracts, Chronic Cataracts, Chronic Obstructive Pulmonary Obstructive Pulmonary Obstructive Pulmonary Disease (COPD), Sleep Disease (COPD),  Sleep Disease (COPD), Sleep Apnea, Angina, Apnea, Angina, Apnea, Angina, Congestive Heart Failure, Congestive Heart Failure, Congestive Heart Failure, Hypertension, Type II Hypertension, Type II Hypertension, Type II Diabetes, Gout, Diabetes, Gout, Diabetes, Gout, Osteoarthritis, Neuropathy Osteoarthritis, Neuropathy Osteoarthritis, Neuropathy Date Acquired: 08/22/2016 10/31/2015 08/09/2016 [name], [name] ([id number]) Weeks of Treatment: 11 44 12 Wound Status: Open Open Open Clustered Wound: No No Yes Measurements [name] x W x D 14.3x15x0.1 0.6x2.1x0.2 18x35x0.1 (cm) Area (cm) : 168.468 0.99 494.801 Volume (cm) : 16.847 0.198 49.48 % Reduction in Area: -14201.20% 86.00% -394.10% % Reduction in Volume: -14177.10% 86.00% -394.10% Classification: Partial Thickness Category/Stage III Grade 2 HBO Classification: Grade 1 Grade 1 N/A Exudate Amount: Large Large Large Exudate Type: Serous Serous Serous Exudate Color: amber amber amber Wound Margin: Flat and Intact Flat and  Intact Flat and Intact Granulation Amount: Large (67-100%) Large (67-100%) Large (67-100%) Granulation Quality: Red Pink Pink Necrotic Amount: None Present (0%) None Present (0%) None Present (0%) Exposed Structures: Fascia: No Fascia: No Fascia: No Fat: No Fat: No Fat: No Tendon: No Tendon: No Tendon: No Muscle: No Muscle: No Muscle: No Joint: No Joint: No Joint: No Bone: No Bone: No Bone: No Limited to Skin Limited to Skin Limited to Skin Breakdown Breakdown Breakdown Epithelialization: None None None Periwound Skin Texture: Edema: No Edema: No Edema: No Excoriation: No Excoriation: No Excoriation: No Induration: No Induration: No Induration: No Callus: No Callus: No Callus: No Crepitus: No Crepitus: No Crepitus: No Fluctuance: No Fluctuance: No Fluctuance: No Friable: No Friable: No Friable: No Rash: No Rash: No Rash: No Scarring: No Scarring: No Scarring: No Periwound Skin Maceration: No Maceration: No Maceration: No Moisture: Moist: No Moist: No Moist: No Dry/Scaly: No Dry/Scaly: No Dry/Scaly: No Periwound Skin Color: Atrophie Blanche: No Atrophie Blanche: No Atrophie Blanche: No Cyanosis: No Cyanosis: No Cyanosis: No Ecchymosis: No Ecchymosis: No Ecchymosis: No Erythema: No Erythema: No Erythema: No Hemosiderin Staining: No Hemosiderin Staining: No Hemosiderin Staining: No Mottled: No Mottled: No Mottled: No Pallor: No Pallor: No Pallor: No Rubor: No Rubor: No Rubor: No Temperature: No Abnormality N/A N/A Tenderness on Yes Yes Yes Palpation: Denardo, Miller V. ([id number]) Wound Preparation: Ulcer Cleansing: Other: Ulcer Cleansing: Other: Ulcer Cleansing: Other: soap and water soap and water soap and water Topical Anesthetic Topical Anesthetic Topical Anesthetic Applied: None Applied: Other: lidocaine Applied: None 4% Treatment Notes Electronic Signature(s) Signed: 11/08/2016 6:06:14 PM By: Curtis Sitesorthy, Joanna Entered By:  Curtis Sitesorthy, Joanna on 11/08/2016 15:49:06 [name], [name] ([id number]) -------------------------------------------------------------------------------- Multi-Disciplinary Care Plan Details Patient Name: [name], [name] Date of Service: 11/08/2016 3:00 PM Medical Record Number: [id number] Patient Account Number: [id number] Date of Birth/Sex: Jun 07, 1946 [age] y.o. Male) Treating RN: Curtis Sitesorthy, Joanna Primary Care Physician: Oretha MilchSMITH, [name] Other Clinician: Referring Physician: Oretha MilchSMITH, [name] Treating Physician/Extender: Rudene ReBritto, Errol Weeks in Treatment: 7144 Active Inactive Abuse / Safety / Falls / Self Care Management Nursing Diagnoses: Potential for falls Goals: Patient will remain injury free Date Initiated: 03/01/2016 Goal Status: Active Interventions: Assess fall risk on admission and as needed Notes: Nutrition Nursing Diagnoses: Imbalanced nutrition Goals: Patient/caregiver agrees to and verbalizes understanding of need to use nutritional supplements and/or vitamins as prescribed Date Initiated: 03/01/2016 Goal Status: Active Interventions: Assess patient nutrition upon admission and as needed per policy Notes: Orientation to the Wound Care Program Nursing Diagnoses: Knowledge deficit related to the wound healing center program Goals: Patient/caregiver will verbalize understanding of the [facility] [address] [name], [name] ([id number]) Date Initiated: 03/01/2016 Goal Status: Active Interventions: Provide education  on orientation to the wound center Notes: Pain, Acute or Chronic Nursing Diagnoses: Pain, acute or chronic: actual or potential Potential alteration in comfort, pain Goals: Patient will verbalize adequate pain control and receive pain control interventions during procedures as needed Date Initiated: 03/01/2016 Goal Status: Active Patient/caregiver will verbalize adequate pain control between visits Date Initiated: 03/01/2016 Goal Status:  Active Interventions: Assess comfort goal upon admission Complete pain assessment as per visit requirements Notes: Pressure Nursing Diagnoses: Knowledge deficit related to causes and risk factors for pressure ulcer development Knowledge deficit related to management of pressures ulcers Goals: Patient/caregiver will verbalize risk factors for pressure ulcer development Date Initiated: 03/01/2016 Goal Status: Active Interventions: Assess offloading mechanisms upon admission and as needed Notes: Wound/Skin Impairment Nursing Diagnoses: GUILLERMO, NEHRING ([id number]) Impaired tissue integrity Goals: Ulcer/skin breakdown will have a volume reduction of 30% by week 4 Date Initiated: 03/01/2016 Goal Status: Active Ulcer/skin breakdown will have a volume reduction of 50% by week 8 Date Initiated: 03/01/2016 Goal Status: Active Ulcer/skin breakdown will have a volume reduction of 80% by week 12 Date Initiated: 03/01/2016 Goal Status: Active Interventions: Assess ulceration(s) every visit Notes: Electronic Signature(s) Signed: 11/08/2016 6:06:14 PM By: [name] Entered By: [name] on 11/08/2016 15:48:59 Waldron, Jahvon V. ([id number]) -------------------------------------------------------------------------------- Pain Assessment Details Patient Name: [name] Date of Service: 11/08/2016 3:00 PM Medical Record Number: [id number] Patient Account Number: [id number] Date of Birth/Sex: 05-14-1946 (70 y.o. Male) Treating RN: [name] Primary Care Physician: Oretha Milch Other Clinician: Referring Physician: Oretha Milch Treating Physician/Extender: Rudene Re in Treatment: 35 Active Problems Location of Pain Severity and Description of Pain Patient Has Paino Yes Site Locations Pain Location: Pain in Ulcers With Dressing Change: Yes Duration of the Pain. Constant / Intermittento Constant Pain Management and Medication Current Pain  Management: Notes Topical or injectable lidocaine is offered to patient for acute pain when surgical debridement is performed. If needed, Patient is instructed to use over the counter pain medication for the following 24-48 hours after debridement. Wound care MDs do not prescribed pain medications. Patient has chronic pain or uncontrolled pain. Patient has been instructed to make an appointment with their Primary Care Physician for pain management. Electronic Signature(s) Signed: 11/08/2016 6:06:14 PM By: [name] Entered By: [name] on 11/08/2016 15:03:39 [name] ([id number]) -------------------------------------------------------------------------------- Patient/Caregiver Education Details Patient Name: [name] Date of Service: 11/08/2016 3:00 PM Medical Record Number: [id number] Patient Account Number: [id number] Date of Birth/Gender: 1946/06/25 (70 y.o. Male) Treating RN: [name] Primary Care Physician: Oretha Milch Other Clinician: Referring Physician: Oretha Milch Treating Physician/Extender: Rudene Re in Treatment: 69 Education Assessment Education Provided To: Patient Education Topics Provided Wound/Skin Impairment: Handouts: Other: use lymph pumps and elevate legs Methods: Explain/Verbal Responses: State content correctly Electronic Signature(s) Signed: 11/08/2016 6:06:14 PM By: [name] Entered By: [name] on 11/08/2016 16:57:32 Karlen, Loudon V. ([id number]) -------------------------------------------------------------------------------- Wound Assessment Details Patient Name: [name] Date of Service: 11/08/2016 3:00 PM Medical Record Number: [id number] Patient Account Number: [id number] Date of Birth/Sex: September 06, 1946 (70 y.o. Male) Treating RN: [name] Primary Care Physician: Oretha Milch Other Clinician: Referring Physician: Oretha Milch Treating Physician/Extender: Rudene Re in  Treatment: 54 Wound Status Wound Number: 10 Primary Lymphedema Etiology: Wound Location: Right Lower Leg - Anterior Secondary Diabetic Wound/Ulcer of the Lower Wounding Event: Blister Etiology: Extremity Date Acquired: 08/22/2016 Wound Open Weeks Of Treatment: 11 Status: Clustered Wound: No Comorbid Cataracts, Chronic Obstructive History: Pulmonary Disease (COPD), Sleep Apnea, Angina,  Congestive Heart Failure, Hypertension, Type II Diabetes, Gout, Osteoarthritis, Neuropathy Photos Wound Measurements Length: (cm) 14.3 Width: (cm) 15 Depth: (cm) 0.1 Area: (cm) 168.468 Volume: (cm) 16.847 % Reduction in Area: -14201.2% % Reduction in Volume: -14177.1% Epithelialization: None Tunneling: No Undermining: No Wound Description Classification: Partial Thickness Diabetic Severity [address]): Grade 1 Wound Margin: Flat and Intact Exudate Amount: Large Exudate Type: Serous Exudate Color: amber Wound Bed Malek, Iain V. ([id number]) Granulation Amount: Large (67-100%) Exposed Structure Granulation Quality: Red Fascia Exposed: No Necrotic Amount: None Present (0%) Fat Layer Exposed: No Tendon Exposed: No Muscle Exposed: No Joint Exposed: No Bone Exposed: No Limited to Skin Breakdown Periwound Skin Texture Texture Color No Abnormalities Noted: No No Abnormalities Noted: No Callus: No Atrophie Blanche: No Crepitus: No Cyanosis: No Excoriation: No Ecchymosis: No Fluctuance: No Erythema: No Friable: No Hemosiderin Staining: No Induration: No Mottled: No Localized Edema: No Pallor: No Rash: No Rubor: No Scarring: No Temperature / Pain Moisture Temperature: No Abnormality No Abnormalities Noted: No Tenderness on Palpation: Yes Dry / Scaly: No Maceration: No Moist: No Wound Preparation Ulcer Cleansing: Other: soap and water, Topical Anesthetic Applied: None Treatment Notes Wound #10 (Right, Anterior Lower Leg) 1. Cleansed with: Cleanse wound with  antibacterial soap and water 4. Dressing Applied: Aquacel Ag 5. Secondary Dressing Applied ABD Pad 7. Secured with 3 Layer Compression System - Bilateral Notes [employer] operational officer) Signed: 11/08/2016 6:06:14 PM By: Percival Spanish, Ademola V. ([id number]) Entered By: [name] on 11/08/2016 15:46:49 [name] ([id number]) -------------------------------------------------------------------------------- Wound Assessment Details Patient Name: [name] Date of Service: 11/08/2016 3:00 PM Medical Record Number: [id number] Patient Account Number: [id number] Date of Birth/Sex: 07/06/1946 (70 y.o. Male) Treating RN: [name] Primary Care Physician: Oretha Milch Other Clinician: Referring Physician: Oretha Milch Treating Physician/Extender: Rudene Re in Treatment: 25 Wound Status Wound Number: 3 Primary Pressure Ulcer Etiology: Wound Location: Right Calcaneus Wound Open Wounding Event: Pressure Injury Status: Date Acquired: 10/31/2015 Comorbid Cataracts, Chronic Obstructive Weeks Of Treatment: 44 History: Pulmonary Disease (COPD), Sleep Clustered Wound: No Apnea, Angina, Congestive Heart Failure, Hypertension, Type II Diabetes, Gout, Osteoarthritis, Neuropathy Photos Wound Measurements Length: (cm) 0.6 Width: (cm) 2.1 Depth: (cm) 0.2 Area: (cm) 0.99 Volume: (cm) 0.198 % Reduction in Area: 86% % Reduction in Volume: 86% Epithelialization: None Tunneling: No Undermining: No Wound Description Classification: Category/Stage III Foul Odor Diabetic Severity (Wagner): Grade 1 Wound Margin: Flat and Intact Exudate Amount: Large Exudate Type: Serous Exudate Color: amber After Cleansing: No Wound Bed Granulation Amount: Large (67-100%) Exposed Structure Granulation Quality: Pink Fascia Exposed: No Carrier, Yazen V. ([id number]) Necrotic Amount: None Present (0%) Fat Layer Exposed: No Tendon Exposed: No Muscle  Exposed: No Joint Exposed: No Bone Exposed: No Limited to Skin Breakdown Periwound Skin Texture Texture Color No Abnormalities Noted: No No Abnormalities Noted: No Callus: No Atrophie Blanche: No Crepitus: No Cyanosis: No Excoriation: No Ecchymosis: No Fluctuance: No Erythema: No Friable: No Hemosiderin Staining: No Induration: No Mottled: No Localized Edema: No Pallor: No Rash: No Rubor: No Scarring: No Temperature / Pain Moisture Tenderness on Palpation: Yes No Abnormalities Noted: No Dry / Scaly: No Maceration: No Moist: No Wound Preparation Ulcer Cleansing: Other: soap and water, Topical Anesthetic Applied: Other: lidocaine 4%, Treatment Notes Wound #3 (Right Calcaneus) 1. Cleansed with: Cleanse wound with antibacterial soap and water 2. Anesthetic Topical Lidocaine 4% cream to wound bed prior to debridement 4. Dressing Applied: Prisma Ag 5. Secondary Dressing Applied ABD Pad 7. Secured with 3 Layer  Compression System - Bilateral Electronic Signature(s) Signed: 11/08/2016 6:06:14 PM By: Curtis Sitesorthy, Joanna Entered By: Curtis Sitesorthy, Joanna on 11/08/2016 15:47:42 [name], [name] ([id number]) [name], [name] ([id number]) -------------------------------------------------------------------------------- Wound Assessment Details Patient Name: [name], Nabil V. Date of Service: 11/08/2016 3:00 PM Medical Record Number: [id number] Patient Account Number: [id number] Date of Birth/Sex: [birth year](70 y.o. Male) Treating RN: Curtis Sitesorthy, Joanna Primary Care Physician: Oretha MilchSMITH, [name] Other Clinician: Referring Physician: Oretha MilchSMITH, [name] Treating Physician/Extender: Rudene ReBritto, Errol Weeks in Treatment: 3444 Wound Status Wound Number: 9 Primary Diabetic Wound/Ulcer of the Lower Etiology: Extremity Wound Location: Left Lower Leg - Circumfernential Secondary Venous Leg Ulcer Etiology: Wounding Event: Gradually Appeared Wound Open Date Acquired: 08/09/2016 Status: Weeks Of Treatment:  12 Comorbid Cataracts, Chronic Obstructive Clustered Wound: Yes History: Pulmonary Disease (COPD), Sleep Apnea, Angina, Congestive Heart Failure, Hypertension, Type II Diabetes, Gout, Osteoarthritis, Neuropathy Photos Wound Measurements Length: (cm) 18 Width: (cm) 35 Depth: (cm) 0.1 Area: (cm) 494.801 Volume: (cm) 49.48 % Reduction in Area: -394.1% % Reduction in Volume: -394.1% Epithelialization: None Tunneling: No Undermining: No Wound Description Classification: Grade 2 Foul Odor Afte Wound Margin: Flat and Intact Exudate Amount: Large Exudate Type: Serous Exudate Color: amber r Cleansing: No Wound Bed Morath, Abimelec V. ([id number]) Granulation Amount: Large (67-100%) Exposed Structure Granulation Quality: Pink Fascia Exposed: No Necrotic Amount: None Present (0%) Fat Layer Exposed: No Tendon Exposed: No Muscle Exposed: No Joint Exposed: No Bone Exposed: No Limited to Skin Breakdown Periwound Skin Texture Texture Color No Abnormalities Noted: No No Abnormalities Noted: No Callus: No Atrophie Blanche: No Crepitus: No Cyanosis: No Excoriation: No Ecchymosis: No Fluctuance: No Erythema: No Friable: No Hemosiderin Staining: No Induration: No Mottled: No Localized Edema: No Pallor: No Rash: No Rubor: No Scarring: No Temperature / Pain Moisture Tenderness on Palpation: Yes No Abnormalities Noted: No Dry / Scaly: No Maceration: No Moist: No Wound Preparation Ulcer Cleansing: Other: soap and water, Topical Anesthetic Applied: None Treatment Notes Wound #9 (Left, Circumferential Lower Leg) 1. Cleansed with: Cleanse wound with antibacterial soap and water 4. Dressing Applied: Aquacel Ag 5. Secondary Dressing Applied ABD Pad 7. Secured with 3 Layer Compression System - Bilateral Notes [employer] forces operational officerxtrasorb Electronic Signature(s) Signed: 11/08/2016 6:06:14 PM By: Percival Spanishorthy, Joanna Veillon, Antoney V. ([id number]) Entered By: Curtis Sitesorthy, Joanna on 11/08/2016  15:48:41 [name], Adi V. ([id number]) -------------------------------------------------------------------------------- Vitals Details Patient Name: [name], Jojuan V. Date of Service: 11/08/2016 3:00 PM Medical Record Number: [id number] Patient Account Number: [id number] Date of Birth/Sex: [birth year](70 y.o. Male) Treating RN: Curtis Sitesorthy, Joanna Primary Care Physician: Oretha MilchSMITH, [name] Other Clinician: Referring Physician: Oretha MilchSMITH, [name] Treating Physician/Extender: Rudene ReBritto, Errol Weeks in Treatment: 7344 Vital Signs Time Taken: 15:03 Temperature (F): 97.9 Height (in): 70 Pulse (bpm): 77 Weight (lbs): 235 Respiratory Rate (breaths/min): 18 Body Mass Index (BMI): 33.7 Blood Pressure (mmHg): 157/74 Reference Range: 80 - 120 mg / dl Electronic Signature(s) Signed: 11/08/2016 6:06:14 PM By: Curtis Sitesorthy, Joanna Entered By: Curtis Sitesorthy, Joanna on 11/08/2016 15:03:59

## 2016-11-09 NOTE — Progress Notes (Signed)
[name], [name] ([id number]) Visit Report for 11/08/2016 Debridement Details Patient Name: [name], [name] Date of Service: 11/08/2016 3:00 PM Medical Record Number: [id number] Patient Account Number: [id number] Date of Birth/Sex: [birth year] (70 y.o. Male) Treating RN: [name] Primary Care Physician: Oretha Milch Other Clinician: Referring Physician: Oretha Milch Treating Physician/Extender: Rudene Re in Treatment: 48 Debridement Performed for Wound #3 Right Calcaneus Assessment: Performed By: Physician Evlyn Kanner, MD Debridement: Debridement Pre-procedure Yes - 15:40 Verification/Time Out Taken: Start Time: 15:40 Pain Control: Lidocaine 4% Topical Solution Level: Skin/Subcutaneous Tissue Total Area Debrided (L x 0.6 (cm) x 2.1 (cm) = 1.26 (cm) W): Tissue and other Viable, Non-Viable, Fibrin/Slough, Subcutaneous material debrided: Instrument: Curette Bleeding: Minimum Hemostasis Achieved: Pressure End Time: 15:43 Procedural Pain: 0 Post Procedural Pain: 0 Response to Treatment: Procedure was tolerated well Post Debridement Measurements of Total Wound Length: (cm) 0.6 Stage: Category/Stage III Width: (cm) 2.1 Depth: (cm) 0.2 Volume: (cm) 0.198 Character of Wound/Ulcer Post Improved Debridement: Severity of Tissue Post Fat layer exposed Debridement: Post Procedure Diagnosis Same as Pre-procedure Electronic Signature(s) [name], [name] ([id number]) Signed: 11/08/2016 4:12:45 PM By: Evlyn Kanner MD, FACS Signed: 11/08/2016 6:06:14 PM By: [name] Entered By: Evlyn Kanner on 11/08/2016 16:12:45 [name], [name] ([id number]) -------------------------------------------------------------------------------- HPI Details Patient Name: [name] Date of Service: 11/08/2016 3:00 PM Medical Record Number: [id number] Patient Account Number: [id number] Date of Birth/Sex: 1946/05/21 (70 y.o. Male) Treating RN: [name] Primary  Care Physician: Oretha Milch Other Clinician: Referring Physician: Oretha Milch Treating Physician/Extender: Rudene Re in Treatment: 47 History of Present Illness Location: ulcerated area on the right heel, left gluteal region and thigh and then bilateral lower extremities Quality: Patient reports experiencing a sharp pain to affected area(s). Severity: Patient states wound are getting worse. Duration: Patient has had the wound for > 12 months prior to seeking treatment at the wound center Timing: Pain in wound is constant (hurts all the time) Context: The wound appeared gradually over time Modifying Factors: Other treatment(s) tried include: he sees his heart doctor and his primary care doctor Associated Signs and Symptoms: patient has not been able to walk for over a year now HPI Description: 70 year old gentleman with a known history of hypertension, diabetes, obstructive sleep apnea, COPD, diastolic CHF, coronary artery disease was admitted to the hospital with sepsis from an ulcer of the right heel and was treated there in October 2016. he also has chronic bilateral lower extremity edema and lymphedema. He had received vancomycin, Zosyn and at that stage and x-rays showed hardware in the right ankle but no evidence of osteomyelitis. he was a former smoker. he was also treated with Augmentin orally for 10 days. He is either bedbound or wheelchair-bound and does not ambulate by himself. 01/19/2016 -- he has not been seen here for 3 weeks and this was because he was admitted to [facility] between 223 and 01/08/2016 for sepsis, UTI and pneumonia involving the left lung. he was treated with IV vancomycin and Zosyn and then meropenem. Was discharged home on oral Bactrim for 2 weeks none of his vascular test or x-rays were done and we will reorder these. 01/26/2016 -- he has not yet done the x-ray of his foot and is vascular tests are still pending. I have  asked him to work on these with his nursing home staff. Addendum: he has got an x-ray of the right foot done which shows that his osteopenia but no specific ostial lysis or abnormal periosteal  reaction. Final impression was degenerative changes with dorsal foot soft tissue swelling. 02/16/2016 -- lower extremity arterial duplex examination shows a 50-99% stenosis of the right tibioperoneal trunk. He had biphasic flow in the right SFA, popliteal and tibioperoneal trunk. Left-sided he had triphasic flow throughout 02/29/2016 -- he is awaiting his vascular opinion with Dr. Gilda Crease which is to be done on April 24 03/08/2016 -- he has not kept his appointment with Dr. Gilda Crease on April 24 and does not seem to know what happened about this. it's difficult to gauge whether he is in full control of his mental faculties as at times he is extremely rude to the nursing staff. 03/22/2016 -- the patient was seen by Dr. 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 Recommended that the patient had severe atherosclerotic changes of both lower extremities associated with ulceration and tissue loss of the foot. This is a limb threatening ischemia and the patient was recommended to undergo Kallenbach, Crisanto V. ([id number]) angiography of the lower extremity with a hope for intervention for limb salvage. Patient agreed and will proceed to angiography. He was admitted to the hospital between May 2 and 03/15/2016 - he underwent induction of a catheter into his right lower extremity and third order catheter placement with contrast injection to the right lower extremity for distal runoff. Percutaneous transluminal angioplasty of the right superficial femoral and popliteal arteries were done. He also had a right peroneal angioplasty. Patient had a postoperative hematoma and was admitted for observation and in  the next 24 hours he was very agitated and combative and had to be seen by psychiatric. He had a follow-up with Dr. Gilda Crease in 3 weeks. 04/18/2016 -- notes reviewed from the vascular office where he was seen for his postop visit after the PTA of the right SFA and popliteal arteries on 03/12/2016. He underwent an ABI which showed right ABI to be more than 1.3 and left to be more than 1 more than 1.3, great toe and PPG waveforms are decreased bilaterally. No additional intervention indicated at this time and the patient was to follow-up in 3 months with an ABI and bilateral lower extremity duplex study. 05/02/2016 -- he complains that his compression stockings are causing him a lot of discomfort and he is not happy wearing them. 05/30/2016 -- the swelling of both legs has increased again and he has had blisters which have opened out into ulcerations. 09/12/2016 -- the lymphedema pumps are still not delivered and hopefully will be done within this week. As far as his skin substitute goes we are still awaiting a response from his insurance 10-10-2016: Mr. Lucken presents today and states that his left edema pumps have arrived but the he has only received treatment "5 times" since arrival. He also denies using the offloading boot for the right heel but admits to having the boot. He states that staff is hesitant to assist and application of the lymphedema pumps. 10/17/2016 -- I understand he continues to take his diuretics and has been using his lymphedema pump once a day for about an hour. He cannot do it twice a day. 10-31-16 Mr. Duchemin presents today for follow-up on his bilateral lower extremity venous ulcers and his right posterior heel pressure ulcer. He states that he continues to do his lymphedema pumps once daily, unable to get assistance for twice daily. he does admit to increased drainage to the left  leg more than the right leg. He denies any pain, fever, chills or overall ill  feeling. 11/08/2016 -- the skin substitute which was run through his insurance company has a high copayment and he is not agreeable about getting this Electronic Signature(s) Signed: 11/08/2016 4:13:28 PM By: Evlyn Kanner MD, FACS Entered By: Evlyn Kanner on 11/08/2016 16:13:28 [name] ([id number]) -------------------------------------------------------------------------------- Physical Exam Details Patient Name: [name] Date of Service: 11/08/2016 3:00 PM Medical Record Number: [id number] Patient Account Number: [id number] Date of Birth/Sex: 09/22/1946 (70 y.o. Male) Treating RN: [name] Primary Care Physician: Oretha Milch Other Clinician: Referring Physician: Oretha Milch Treating Physician/Extender: Rudene Re in Treatment: 50 Constitutional . Pulse regular. Respirations normal and unlabored. Afebrile. . Eyes Nonicteric. Reactive to light. Ears, Nose, Mouth, and Throat Lips, teeth, and gums WNL.Marland [name] Moist mucosa without lesions. Neck supple and nontender. No palpable supraclavicular or cervical adenopathy. Normal sized without goiter. Respiratory WNL. No retractions.. Breath sounds WNL, No rubs, rales, rhonchi, or wheeze.. Cardiovascular Heart rhythm and rate regular, no murmur or gallop.. Pedal Pulses WNL. No clubbing, cyanosis or edema. Chest Breasts symmetical and no nipple discharge.. Breast tissue WNL, no masses, lumps, or tenderness.. Lymphatic No adneopathy. No adenopathy. No adenopathy. Musculoskeletal Adexa without tenderness or enlargement.. Digits and nails w/o clubbing, cyanosis, infection, petechiae, ischemia, or inflammatory conditions.. Integumentary (Hair, Skin) No suspicious lesions. No crepitus or fluctuance. No peri-wound warmth or erythema. No masses.Marland [name] Psychiatric Judgement and insight Intact.. No evidence of depression, anxiety, or agitation.. Notes the wounds on both lower extremities continue to have mild excoriation  around it possibly from superficial ulceration and the lymphedema persist. There may also be a element of fungal infection. The wound on the right medial calcaneum has been a bit macerated and there is some subcutaneous debris which I have removed sharply with a #3 curet and bleeding controlled with pressure Electronic Signature(s) Signed: 11/08/2016 4:14:38 PM By: Evlyn Kanner MD, FACS Entered By: Evlyn Kanner on 11/08/2016 16:14:38 [name] ([id number]) -------------------------------------------------------------------------------- Physician Orders Details Patient Name: [name] Date of Service: 11/08/2016 3:00 PM Medical Record Number: [id number] Patient Account Number: [id number] Date of Birth/Sex: Mar 07, 1946 (70 y.o. Male) Treating RN: [name] Primary Care Physician: Oretha Milch Other Clinician: Referring Physician: Oretha Milch Treating Physician/Extender: Rudene Re in Treatment: 40 Verbal / Phone Orders: Yes Clinician: [name] Read Back and Verified: Yes Diagnosis Coding Wound Cleansing Wound #10 Right,Anterior Lower Leg o No tub bath. - only sink bath or bed bath Wound #3 Right Calcaneus o No tub bath. - only sink bath or bed bath Wound #9 Left,Circumferential Lower Leg o No tub bath. - only sink bath or bed bath Anesthetic Wound #10 Right,Anterior Lower Leg o Topical Lidocaine 4% cream applied to wound bed prior to debridement - for clinic use only Wound #3 Right Calcaneus o Topical Lidocaine 4% cream applied to wound bed prior to debridement - for clinic use only Wound #9 Left,Circumferential Lower Leg o Topical Lidocaine 4% cream applied to wound bed prior to debridement - for clinic use only Skin Barriers/Peri-Wound Care Wound #10 Right,Anterior Lower Leg o Antifungal cream - Nystatin o Triamcinolone Acetonide Ointment Wound #3 Right Calcaneus o Antifungal cream - Nystatin o Triamcinolone Acetonide  Ointment Wound #9 Left,Circumferential Lower Leg o Antifungal cream - Nystatin o Triamcinolone Acetonide Ointment Primary Wound Dressing Wound #10 Right,Anterior Lower Leg o Aquacel Ag [place], Trong V. ([id number]) Wound #3 Right Calcaneus o Prisma Ag Wound #9 Left,Circumferential Lower Leg   o Aquacel Ag Secondary Dressing Wound #10 Right,Anterior Lower Leg o ABD pad o XtraSorb Wound #3 Right Calcaneus o ABD pad Wound #9 Left,Circumferential Lower Leg o ABD pad o XtraSorb Dressing Change Frequency Wound #10 Right,Anterior Lower Leg o Change dressing every week Wound #3 Right Calcaneus o Change dressing every week Wound #9 Left,Circumferential Lower Leg o Change dressing every week Follow-up Appointments Wound #10 Right,Anterior Lower Leg o Return Appointment in 1 week. Wound #3 Right Calcaneus o Return Appointment in 1 week. Wound #9 Left,Circumferential Lower Leg o Return Appointment in 1 week. Edema Control Wound #10 Right,Anterior Lower Leg o 3 Layer Compression System - Bilateral o Compression Pump: Use compression pump on left lower extremity for 30 minutes, twice daily. o Compression Pump: Use compression pump on right lower extremity for 30 minutes, twice daily. o Other: - unna to anchor [name], [name] ([id number]) Wound #3 Right Calcaneus o 3 Layer Compression System - Bilateral o Compression Pump: Use compression pump on left lower extremity for 30 minutes, twice daily. o Compression Pump: Use compression pump on right lower extremity for 30 minutes, twice daily. o Other: - unna to anchor Wound #9 Left,Circumferential Lower Leg o 3 Layer Compression System - Bilateral o Compression Pump: Use compression pump on left lower extremity for 30 minutes, twice daily. o Compression Pump: Use compression pump on right lower extremity for 30 minutes, twice daily. o Other: - unna to  anchor Off-Loading Wound #10 Right,Anterior Lower Leg o Turn and reposition every 2 hours o Other: - sage boots at night and float heel when lying in bed *******lymphedema pumps to be placed on pt twice a day for one hour sessions******** Wound #3 Right Calcaneus o Turn and reposition every 2 hours o Other: - sage boots at night and float heel when lying in bed *******lymphedema pumps to be placed on pt twice a day for one hour sessions******** Wound #9 Left,Circumferential Lower Leg o Turn and reposition every 2 hours o Other: - sage boots at night and float heel when lying in bed *******lymphedema pumps to be placed on pt twice a day for one hour sessions******** Additional Orders / Instructions Wound #10 Right,Anterior Lower Leg o Increase protein intake. Wound #3 Right Calcaneus o Increase protein intake. Wound #9 Left,Circumferential Lower Leg o Increase protein intake. Medications-please add to medication list. Wound #10 Right,Anterior Lower Leg Yeargan, Tyrrell V. ([id number]) o Other: - Vitamin C, Vitamin A, Zinc, multivitamin Wound #3 Right Calcaneus o Other: - Vitamin C, Vitamin A, Zinc, multivitamin Wound #9 Left,Circumferential Lower Leg o Other: - Vitamin C, Vitamin A, Zinc, multivitamin Notes Patient complains of fungal infection in his groin area today. Please have facility MD treat this asap. Thank you! Electronic Signature(s) Signed: 11/08/2016 4:27:48 PM By: Evlyn Kanner MD, FACS Signed: 11/08/2016 6:06:14 PM By: [name] Entered By: [name] on 11/08/2016 15:52:07 [name] ([id number]) -------------------------------------------------------------------------------- Problem List Details Patient Name: [name] Date of Service: 11/08/2016 3:00 PM Medical Record Number: [id number] Patient Account Number: [id number] Date of Birth/Sex: 01-12-1946 (70 y.o. Male) Treating RN: [name] Primary Care  Physician: Oretha Milch Other Clinician: Referring Physician: Oretha Milch Treating Physician/Extender: Rudene Re in Treatment: 56 Active Problems ICD-10 Encounter Code Description Active Date Diagnosis E11.621 Type 2 diabetes mellitus with foot ulcer 01/01/2016 Yes L89.613 Pressure ulcer of right heel, stage 3 01/01/2016 Yes I89.0 Lymphedema, not elsewhere classified 01/01/2016 Yes E66.01 Morbid (severe) obesity due to excess calories 01/01/2016 Yes M70.871 Other soft  tissue disorders related to use, overuse and 01/19/2016 Yes pressure, right ankle and foot 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/16/2016 Yes ulceration of heel and midfoot Inactive Problems Resolved Problems ICD-10 Code Description Active Date Resolved Date L89.322 Pressure ulcer of left buttock, stage 2 01/01/2016 01/01/2016 Electronic Signature(s) Signed: 11/08/2016 4:12:34 PM By: Evlyn Kanner MD, FACS Entered By: Evlyn Kanner on 11/08/2016 16:12:34 [name] ([id number]) [name], [name] ([id number]) -------------------------------------------------------------------------------- Progress Note Details Patient Name: [name] Date of Service: 11/08/2016 3:00 PM Medical Record Number: [id number] Patient Account Number: [id number] Date of Birth/Sex: July 01, 1946 (70 y.o. Male) Treating RN: [name] Primary Care Physician: Oretha Milch Other Clinician: Referring Physician: Oretha Milch Treating Physician/Extender: Rudene Re in Treatment: 44 Subjective History of Present Illness (HPI) The following HPI elements were documented for the patient's wound: Location: ulcerated area on the right heel, left gluteal region and thigh and then bilateral lower extremities Quality: Patient reports experiencing a sharp pain to affected area(s). Severity: Patient states wound are getting worse. Duration: Patient has had the wound for > 12 months prior to seeking treatment  at the wound center Timing: Pain in wound is constant (hurts all the time) Context: The wound appeared gradually over time Modifying Factors: Other treatment(s) tried include: he sees his heart doctor and his primary care doctor Associated Signs and Symptoms: patient has not been able to walk for over a year now 70 year old gentleman with a known history of hypertension, diabetes, obstructive sleep apnea, COPD, diastolic CHF, coronary artery disease was admitted to the hospital with sepsis from an ulcer of the right heel and was treated there in October 2016. he also has chronic bilateral lower extremity edema and lymphedema. He had received vancomycin, Zosyn and at that stage and x-rays showed hardware in the right ankle but no evidence of osteomyelitis. he was a former smoker. he was also treated with Augmentin orally for 10 days. He is either bedbound or wheelchair-bound and does not ambulate by himself. 01/19/2016 -- he has not been seen here for 3 weeks and this was because he was admitted to [facility] between 223 and 01/08/2016 for sepsis, UTI and pneumonia involving the left lung. he was treated with IV vancomycin and Zosyn and then meropenem. Was discharged home on oral Bactrim for 2 weeks none of his vascular test or x-rays were done and we will reorder these. 01/26/2016 -- he has not yet done the x-ray of his foot and is vascular tests are still pending. I have asked him to work on these with his nursing home staff. Addendum: he has got an x-ray of the right foot done which shows that his osteopenia but no specific ostial lysis or abnormal periosteal reaction. Final impression was degenerative changes with dorsal foot soft tissue swelling. 02/16/2016 -- lower extremity arterial duplex examination shows a 50-99% stenosis of the right tibioperoneal trunk. He had biphasic flow in the right SFA, popliteal and tibioperoneal trunk. Left-sided he had triphasic flow  throughout 02/29/2016 -- he is awaiting his vascular opinion with Dr. Gilda Crease which is to be done on April 24 03/08/2016 -- he has not kept his appointment with Dr. Gilda Crease on April 24 and does not seem to know what happened about this. it's difficult to gauge whether he is in full control of his mental faculties as at times he is extremely rude to the nursing staff. 03/22/2016 -- the patient was seen by Dr. Levora Dredge on 03/07/2016 --  assessment and plan was that [facility], Jorgen V. 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 Recommended that the patient had severe atherosclerotic changes of both lower extremities associated with ulceration and tissue loss of the foot. This is a limb threatening ischemia and the patient was recommended to undergo angiography of the lower extremity with a hope for intervention for limb salvage. Patient agreed and will proceed to angiography. He was admitted to the hospital between May 2 and 03/15/2016 - he underwent induction of a catheter into his right lower extremity and third order catheter placement with contrast injection to the right lower extremity for distal runoff. Percutaneous transluminal angioplasty of the right superficial femoral and popliteal arteries were done. He also had a right peroneal angioplasty. Patient had a postoperative hematoma and was admitted for observation and in the next 24 hours he was very agitated and combative and had to be seen by psychiatric. He had a follow-up with Dr. Gilda Crease in 3 weeks. 04/18/2016 -- notes reviewed from the vascular office where he was seen for his postop visit after the PTA of the right SFA and popliteal arteries on 03/12/2016. He underwent an ABI which showed right ABI to be more than 1.3 and left to be more than 1 more than 1.3, great toe and PPG waveforms are decreased bilaterally. No additional intervention indicated at this time and  the patient was to follow-up in 3 months with an ABI and bilateral lower extremity duplex study. 05/02/2016 -- he complains that his compression stockings are causing him a lot of discomfort and he is not happy wearing them. 05/30/2016 -- the swelling of both legs has increased again and he has had blisters which have opened out into ulcerations. 09/12/2016 -- the lymphedema pumps are still not delivered and hopefully will be done within this week. As far as his skin substitute goes we are still awaiting a response from his insurance 10-10-2016: Mr. Nunziato presents today and states that his left edema pumps have arrived but the he has only received treatment "5 times" since arrival. He also denies using the offloading boot for the right heel but admits to having the boot. He states that staff is hesitant to assist and application of the lymphedema pumps. 10/17/2016 -- I understand he continues to take his diuretics and has been using his lymphedema pump once a day for about an hour. He cannot do it twice a day. 10-31-16 Mr. Sheriff presents today for follow-up on his bilateral lower extremity venous ulcers and his right posterior heel pressure ulcer. He states that he continues to do his lymphedema pumps once daily, unable to get assistance for twice daily. he does admit to increased drainage to the left leg more than the right leg. He denies any pain, fever, chills or overall ill feeling. 11/08/2016 -- the skin substitute which was run through his insurance company has a high copayment and he is not agreeable about getting this Objective Constitutional Pulse regular. Respirations normal and unlabored. Afebrile. [name], [name] ([id number]) Vitals Time Taken: 3:03 PM, Height: 70 in, Weight: 235 lbs, BMI: 33.7, Temperature: 97.9 F, Pulse: 77 bpm, Respiratory Rate: 18 breaths/min, Blood Pressure: 157/74 mmHg. Eyes Nonicteric. Reactive to light. Ears, Nose, Mouth, and Throat Lips,  teeth, and gums WNL.Marland [name] Moist mucosa without lesions. Neck supple and nontender. No palpable supraclavicular or cervical adenopathy. Normal sized without goiter. Respiratory WNL. No retractions.. Breath sounds WNL, No rubs, rales, rhonchi, or wheeze.Marland [name]  Cardiovascular Heart rhythm and rate regular, no murmur or gallop.. Pedal Pulses WNL. No clubbing, cyanosis or edema. Chest Breasts symmetical and no nipple discharge.. Breast tissue WNL, no masses, lumps, or tenderness.. Lymphatic No adneopathy. No adenopathy. No adenopathy. Musculoskeletal Adexa without tenderness or enlargement.. Digits and nails w/o clubbing, cyanosis, infection, petechiae, ischemia, or inflammatory conditions.Marland [name]. Psychiatric Judgement and insight Intact.. No evidence of depression, anxiety, or agitation.. General Notes: the wounds on both lower extremities continue to have mild excoriation around it possibly from superficial ulceration and the lymphedema persist. There may also be a element of fungal infection. The wound on the right medial calcaneum has been a bit macerated and there is some subcutaneous debris which I have removed sharply with a #3 curet and bleeding controlled with pressure Integumentary (Hair, Skin) No suspicious lesions. No crepitus or fluctuance. No peri-wound warmth or erythema. No masses.. Wound #10 status is Open. Original cause of wound was Blister. The wound is located on the Right,Anterior Lower Leg. The wound measures 14.3cm length x 15cm width x 0.1cm depth; 168.468cm^2 area and 16.847cm^3 volume. The wound is limited to skin breakdown. There is no tunneling or undermining noted. There is a large amount of serous drainage noted. The wound margin is flat and intact. There is large (67- 100%) red granulation within the wound bed. There is no necrotic tissue within the wound bed. The periwound skin appearance did not exhibit: Callus, Crepitus, Excoriation, Fluctuance, Friable,  Induration, Localized Edema, Rash, Scarring, Dry/Scaly, Maceration, Moist, Atrophie Blanche, Cyanosis, Ecchymosis, Hemosiderin Staining, Mottled, Pallor, Rubor, Erythema. Periwound temperature was noted as No Abnormality. The periwound has tenderness on palpation. [name], [name] ([id number]) Wound #3 status is Open. Original cause of wound was Pressure Injury. The wound is located on the Right Calcaneus. The wound measures 0.6cm length x 2.1cm width x 0.2cm depth; 0.99cm^2 area and 0.198cm^3 volume. The wound is limited to skin breakdown. There is no tunneling or undermining noted. There is a large amount of serous drainage noted. The wound margin is flat and intact. There is large (67- 100%) pink granulation within the wound bed. There is no necrotic tissue within the wound bed. The periwound skin appearance did not exhibit: Callus, Crepitus, Excoriation, Fluctuance, Friable, Induration, Localized Edema, Rash, Scarring, Dry/Scaly, Maceration, Moist, Atrophie Blanche, Cyanosis, Ecchymosis, Hemosiderin Staining, Mottled, Pallor, Rubor, Erythema. The periwound has tenderness on palpation. Wound #9 status is Open. Original cause of wound was Gradually Appeared. The wound is located on the Left,Circumferential Lower Leg. The wound measures 18cm length x 35cm width x 0.1cm depth; 494.801cm^2 area and 49.48cm^3 volume. The wound is limited to skin breakdown. There is no tunneling or undermining noted. There is a large amount of serous drainage noted. The wound margin is flat and intact. There is large (67-100%) pink granulation within the wound bed. There is no necrotic tissue within the wound bed. The periwound skin appearance did not exhibit: Callus, Crepitus, Excoriation, Fluctuance, Friable, Induration, Localized Edema, Rash, Scarring, Dry/Scaly, Maceration, Moist, Atrophie Blanche, Cyanosis, Ecchymosis, Hemosiderin Staining, Mottled, Pallor, Rubor, Erythema. The periwound has tenderness on  palpation. 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 There was a Skin/Subcutaneous Tissue Debridement ([id number]) debridement with total area of 1.26 sq cm performed by Evlyn Kanner, MD. with the following instrument(s): Curette to remove Viable and Non-Viable tissue/material including Fibrin/Slough and Subcutaneous after achieving pain control using Lidocaine 4% Topical Solution. A time out was conducted at 15:40, prior to the start of the procedure. A Minimum amount of bleeding was controlled with Pressure. The procedure was tolerated well with a pain level of 0 throughout and a pain level of 0 following the procedure. Post Debridement Measurements: 0.6cm length x 2.1cm width x 0.2cm depth; 0.198cm^3 volume. Post debridement Stage noted as Category/Stage III. [name], [name] ([id number]) Character of Wound/Ulcer Post Debridement is improved. Severity of Tissue Post Debridement is: Fat layer exposed. Post procedure Diagnosis Wound #3: Same as Pre-Procedure Plan Wound Cleansing: Wound #10 Right,Anterior Lower Leg: No tub bath. - only sink bath or bed bath Wound #3 Right Calcaneus: No tub bath. - only sink bath or bed bath Wound #9 Left,Circumferential Lower Leg: No tub bath. - only sink bath or bed bath Anesthetic: Wound #10 Right,Anterior Lower Leg: Topical Lidocaine 4% cream applied to wound bed prior to debridement - for clinic use only Wound #3 Right Calcaneus: Topical Lidocaine 4% cream applied to wound bed prior to  debridement - for clinic use only Wound #9 Left,Circumferential Lower Leg: Topical Lidocaine 4% cream applied to wound bed prior to debridement - for clinic use only Skin Barriers/Peri-Wound Care: Wound #10 Right,Anterior Lower Leg: Antifungal cream - Nystatin Triamcinolone Acetonide Ointment Wound #3 Right Calcaneus: Antifungal cream - Nystatin Triamcinolone Acetonide Ointment Wound #9 Left,Circumferential Lower Leg: Antifungal cream - Nystatin Triamcinolone Acetonide Ointment Primary Wound Dressing: Wound #10 Right,Anterior Lower Leg: Aquacel Ag Wound #3 Right Calcaneus: Prisma Ag Wound #9 Left,Circumferential Lower Leg: Aquacel Ag Secondary Dressing: Wound #10 Right,Anterior Lower Leg: ABD pad XtraSorb Wound #3 Right Calcaneus: ABD pad Wound #9 Left,Circumferential Lower Leg: ABD pad Merendino, Matheu V. ([id number]) XtraSorb Dressing Change Frequency: Wound #10 Right,Anterior Lower Leg: Change dressing every week Wound #3 Right Calcaneus: Change dressing every week Wound #9 Left,Circumferential Lower Leg: Change dressing every week Follow-up Appointments: Wound #10 Right,Anterior Lower Leg: Return Appointment in 1 week. Wound #3 Right Calcaneus: Return Appointment in 1 week. Wound #9 Left,Circumferential Lower Leg: Return Appointment in 1 week. Edema Control: Wound #10 Right,Anterior Lower Leg: 3 Layer Compression System - Bilateral Compression Pump: Use compression pump on left lower extremity for 30 minutes, twice daily. Compression Pump: Use compression pump on right lower extremity for 30 minutes, twice daily. Other: - unna to anchor Wound #3 Right Calcaneus: 3 Layer Compression System - Bilateral Compression Pump: Use compression pump on left lower extremity for 30 minutes, twice daily. Compression Pump: Use compression pump on right lower extremity for 30 minutes, twice daily. Other: - unna to anchor Wound #9 Left,Circumferential Lower Leg: 3 Layer  Compression System - Bilateral Compression Pump: Use compression pump on left lower extremity for 30 minutes, twice daily. Compression Pump: Use compression pump on right lower extremity for 30 minutes, twice daily. Other: - unna to anchor Off-Loading: Wound #10 Right,Anterior Lower Leg: Turn and reposition every 2 hours Other: - sage boots at night and float heel when lying in bed *******lymphedema pumps to be placed on pt twice a day for one hour sessions******** Wound #3 Right Calcaneus: Turn and reposition every 2 hours Other: - sage boots at night and float  heel when lying in bed *******lymphedema pumps to be placed on pt twice a day for one hour sessions******** Wound #9 Left,Circumferential Lower Leg: Turn and reposition every 2 hours Other: - sage boots at night and float heel when lying in bed *******lymphedema pumps to be placed on pt twice a day for one hour sessions******** Additional Orders / Instructions: Wound #10 Right,Anterior Lower Leg: Increase protein intake. Wound #3 Right Calcaneus: Increase protein intake. Wound #9 Left,Circumferential Lower Leg: Increase protein intake. MUSTAFA, POTTS ([id number]) Medications-please add to medication list.: Wound #10 Right,Anterior Lower Leg: Other: - Vitamin C, Vitamin A, Zinc, multivitamin Wound #3 Right Calcaneus: Other: - Vitamin C, Vitamin A, Zinc, multivitamin Wound #9 Left,Circumferential Lower Leg: Other: - Vitamin C, Vitamin A, Zinc, multivitamin General Notes: Patient complains of fungal infection in his groin area today. Please have facility MD treat this asap. Thank you! I have recommended : 1. using Silver alginate and Drawtex for his heel to be changed every week 2. he has received his lymphedema pumps which hopefully will make a difference to his overall general wound healing. he is finding it difficult to get someone to help him put these on and off at his nursing home and we will attempt to talk to the  nursing supervisor there. at present he is using them once a day only. 3. We will continue with silver alginate and a 3 layer Profore. Electronic Signature(s) Signed: 11/08/2016 4:15:32 PM By: Evlyn Kanner MD, FACS Entered By: Evlyn Kanner on 11/08/2016 16:15:32 [name] ([id number]) -------------------------------------------------------------------------------- SuperBill Details Patient Name: [name] Date of Service: 11/08/2016 Medical Record Number: [id number] Patient Account Number: [id number] Date of Birth/Sex: 17-Jan-1946 (70 y.o. 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

## 2016-11-12 NOTE — Progress Notes (Signed)
MALACHY, COLEMAN ([id number]) Visit Report for [name] Arrival Information Details Patient Name: [name], [name] Date of Service: [name] 1:15 PM Medical Record Number: [id number] Patient Account Number: [id number] Date of Birth/Sex: 06/13/1946 (71 y.o. Male) Treating RN: [name] Primary Care Physician: [name] Other Clinician: Referring Physician: [name] Treating Physician/Extender: [name] in Treatment: 59 Visit Information History Since Last Visit All ordered tests and consults were completed: No Patient Arrived: Wheel Chair Added or deleted any medications: No Arrival Time: 13:22 Any new allergies or adverse reactions: No Accompanied By: self Had a fall or experienced change in No Transfer Assistance: EasyP[employer] activities of daily living that may affect Patient Lift risk of falls: Patient Identification Verified: Yes Signs or symptoms of abuse/neglect since last No Secondary Verification Process Yes visito Completed: Hospitalized since last visit: No Patient Requires Transmission- No Has Dressing in Place as Prescribed: Yes Based Precautions: Has Compression in Place as Prescribed: Yes Patient Has Alerts: Yes Pain Present Now: No Patient Alerts: DM II Electronic Signature(s) Signed: 11/12/2016 2:01:11 PM By: [name] Entered By: [name] 13:22:39 [name] ([id number]) -------------------------------------------------------------------------------- Encounter Discharge Information Details Patient Name: [name] Date of Service: [name] 1:15 PM Medical Record Number: [id number] Patient Account Number: [id number] Date of Birth/Sex: 1945/12/20 (71 y.o. Male) Treating RN: [name] Primary Care Physician: [name] Other Clinician: Referring Physician: [name] Treating Physician/Extender: [name] in Treatment: 56 Encounter Discharge Information Items Discharge  Pain Level: 0 Discharge Condition: Stable Ambulatory Status: Wheelchair Discharge Destination: Nursing Home Transportation: Other Accompanied By: self Schedule Follow-up Appointment: Yes Medication Reconciliation completed and provided to Patient/Care Yes [name]: Provided on Clinical Summary of Care: [name] Form Type Recipient Paper Patient EL Electronic Signature(s) Signed: 11/12/2016 2:01:11 PM By: [name] Previous Signature: [name] 3:03:05 PM Version By: [name] Entered By: [name] 15:03:32 [name] ([id number]) -------------------------------------------------------------------------------- Lower Extremity Assessment Details Patient Name: [name] Date of Service: [name] 1:15 PM Medical Record Number: [id number] Patient Account Number: [id number] Date of Birth/Sex: 1945-11-28 (70 y.o. Male) Treating RN: [name] Primary Care Physician: [name] Other Clinician: Referring Physician: [name] Treating Physician/Extender: [name] in Treatment: 26 Edema Assessment Assessed: [Left: No] [Right: No] E[Left: dema] [Right: :] Calf Left: Right: Point of Measurement: 34 cm From Medial Instep 38.8 cm 42 cm Ankle Left: Right: Point of Measurement: 12 cm From Medial Instep 26.5 cm 25.5 cm Vascular Assessment Pulses: Dorsalis Pedis Palpable: [Left:Yes] [Right:Yes] Posterior Tibial Extremity colors, hair growth, and conditions: Extremity Color: [Left:Red] [Right:Red] Temperature of Extremity: [Left:Warm] [Right:Cool] Capillary Refill: [Left:< 3 seconds] [Right:< 3 seconds] Toe Nail Assessment Left: Right: Thick: Yes Yes Discolored: Yes Yes Deformed: Yes Yes Improper Length and Hygiene: Yes Yes Electronic Signature(s) Signed: 11/12/2016 2:01:11 PM By: [name] Entered By: [name] 13:33:07 [name], [name]  ([id number]) -------------------------------------------------------------------------------- Multi Wound Chart Details Patient Name: [name] Date of Service: [name] 1:15 PM Medical Record Number: [id number] Patient Account Number: [id number] Date of Birth/Sex: 07/27/1946 (71 y.o. 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 Comorbid History: Cataracts, Chronic Cataracts, Chronic Cataracts, Chronic Obstructive Pulmonary Obstructive Pulmonary Obstructive Pulmonary Disease (COPD), Sleep Disease (COPD), Sleep Disease (COPD), Sleep Apnea, Angina, Apnea, Angina, Apnea, Angina, Congestive Heart Failure, Congestive Heart Failure, Congestive Heart Failure, Hypertension, Type II Hypertension, Type II Hypertension, Type II Diabetes, Gout, Diabetes, Gout, Diabetes, Gout, Osteoarthritis, Neuropathy Osteoarthritis, Neuropathy Osteoarthritis, Neuropathy Date Acquired: 08/22/2016 10/31/2015 08/09/2016 Weeks of Treatment: 10 43 11 Wound Status: Open Open Open Clustered Wound: No No Yes Measurements L x W x D 19.5x21.5x0.1 0.6x2.2x0.3 21x39x0.1 (cm) Area (cm) : 329.278 1.037 643.241 Volume (cm) : 32.928 0.311 64.324 % Reduction in Area: -27852.30% 85.30% -542.40% [name], [name] ([id number]) % Reduction in Volume: -27805.10% 78.00%  -542.30% Classification: Partial Thickness Category/Stage III Grade 2 HBO Classification: Grade 1 Grade 1 N/A Exudate Amount: Large Large Large Exudate Type: Serous Serous Serous Exudate Color: amber amber amber Wound Margin: Distinct, outline attached Distinct, outline attached Distinct, outline attached Granulation Amount: Large (67-100%) None Present (0%) Large (67-100%) Granulation Quality: Pink, Pale N/A Red Necrotic Amount: Small (1-33%) Large (67-100%) Small (1-33%) Exposed Structures: Fascia: No Fascia: No Fascia: No Fat: No Fat: No Fat: No Tendon: No Tendon: No Tendon: No Muscle: No Muscle: No Muscle: No Joint: No Joint: No Joint: No Bone: No Bone: No Bone: No Limited to Skin Limited to Skin Limited to Skin Breakdown Breakdown Breakdown Epithelialization: None None None Debridement: N/A Debridement ([id number] N/A 11047) Pre-procedure N/A 14:20 N/A Verification/Time Out Taken: Pain Control: N/A Lidocaine 4% Topical N/A Solution Tissue Debrided: N/A Fibrin/Slough, Exudates, N/A Subcutaneous Level: N/A Skin/Subcutaneous N/A Tissue Debridement Area (sq N/A 1.32 N/A cm): Instrument: N/A Curette N/A Bleeding: N/A Minimum N/A Hemostasis Achieved: N/A Pressure N/A Procedural Pain: N/A 0 N/A Post Procedural Pain: N/A 0 N/A Debridement Treatment N/A Procedure was tolerated N/A Response: well Post Debridement N/A 0.6x2.2x0.3 N/A Measurements L x W x D (cm) Post Debridement N/A 0.311 N/A Volume: (cm) Post Debridement N/A Category/Stage III N/A Stage: Periwound Skin Texture: Edema: Yes Edema: Yes Edema: Yes Excoriation: No Excoriation: No Excoriation: No Induration: No Induration: No Induration: No [name], [name] ([id number]) Callus: No Callus: No Callus: No Crepitus: No Crepitus: No Crepitus: No Fluctuance: No Fluctuance: No Fluctuance: No Friable: No Friable: No Friable: No Rash: No Rash: No Rash: No Scarring: No Scarring:  No Scarring: No Periwound Skin Maceration: Yes Maceration: Yes Maceration: Yes Moisture: Moist: Yes Moist: Yes Moist: Yes Dry/Scaly: No Dry/Scaly: No Dry/Scaly: No Periwound Skin Color: Hemosiderin Staining: Yes Hemosiderin Staining: Yes Hemosiderin Staining: Yes Mottled: Yes Mottled: Yes Mottled: Yes Atrophie Blanche: No Atrophie Blanche: No Atrophie Blanche: No Cyanosis: No Cyanosis: No Cyanosis: No Ecchymosis: No Ecchymosis: No Ecchymosis: No Erythema: No Erythema: No Erythema: No Pallor: No Pallor: No Pallor: No Rubor: No Rubor: No Rubor: No Temperature: No Abnormality No Abnormality No Abnormality Tenderness on Yes No Yes Palpation: Wound Preparation: Ulcer Cleansing: Other: Ulcer Cleansing: Other: Ulcer Cleansing: Other: surg scrub and water surg scrub and water surg scrub and water Topical Anesthetic Topical Anesthetic Topical Anesthetic Applied: None Applied: Other: lidocaine Applied: None 4% Procedures Performed: N/A Debridement N/A Treatment Notes Wound #10 (Right, Anterior Lower Leg) 1. Cleansed with: Clean wound with Normal Saline Cleanse wound with antibacterial soap and water 2. Anesthetic Topical Lidocaine 4% cream to wound bed prior to debridement 4. Dressing Applied: Aquacel Ag 5. Secondary Dressing Applied ABD Pad 7. Secured with Tape  3 Layer Compression System - Bilateral Notes xtrasorb, unna to anchor Wound #3 (Right Calcaneus) 1. Cleansed with: Clean wound with Normal Saline Swander, Ravinder V. ([id number]) Cleanse wound with antibacterial soap and water 2. Anesthetic Topical Lidocaine 4% cream to wound bed prior to debridement 4. Dressing Applied: Prisma Ag 5. Secondary Dressing Applied ABD Pad Dry Gauze 7. Secured with Tape 3 Layer Compression System - Bilateral Notes unna to anchor Wound #9 (Left, Anterior Lower Leg) 1. Cleansed with: Clean wound with Normal Saline Cleanse wound with antibacterial soap and  water 2. Anesthetic Topical Lidocaine 4% cream to wound bed prior to debridement 4. Dressing Applied: Aquacel Ag 5. Secondary Dressing Applied ABD Pad 7. Secured with Tape 3 Layer Compression System - Bilateral Notes xtrasorb, unna to Ecologist) Signed: 10/31/2016 4:47:33 PM By: Penne Lash, NP, Leah Entered By: Penne Lash, NP, Leah on [name] 16:47:33 [name] ([id number]) -------------------------------------------------------------------------------- Multi-Disciplinary Care Plan Details Patient Name: [name] Date of Service: [name] 1:15 PM Medical Record Number: [id number] Patient Account Number: [id number] Date of Birth/Sex: [birth year] (71 y.o. Male) Treating RN: [name] Primary Care Physician: [name] Other Clinician: Referring Physician: [name] Treating Physician/Extender: [name] in Treatment: 32 Active Inactive Abuse / Safety / Falls / Self Care Management Nursing Diagnoses: Potential for falls Goals: Patient will remain injury free Date Initiated: 03/01/2016 Goal Status: Active Interventions: Assess fall risk on admission and as needed Notes: Nutrition Nursing Diagnoses: Imbalanced nutrition Goals: Patient/caregiver agrees to and verbalizes understanding of need to use nutritional supplements and/or vitamins as prescribed Date Initiated: 03/01/2016 Goal Status: Active Interventions: Assess patient nutrition upon admission and as needed per policy Notes: Orientation to the Wound Care Program Nursing Diagnoses: Knowledge deficit related to the wound healing center program Goals: Patient/caregiver will verbalize understanding of the [facility]aling Center [address] JIE, STICKELS ([id number]) Date Initiated: 03/01/2016 Goal Status: Active Interventions: Provide education on orientation to the wound center Notes: Pain, Acute or Chronic Nursing Diagnoses: Pain, acute or chronic: actual or  potential Potential alteration in comfort, pain Goals: Patient will verbalize adequate pain control and receive pain control interventions during procedures as needed Date Initiated: 03/01/2016 Goal Status: Active Patient/caregiver will verbalize adequate pain control between visits Date Initiated: 03/01/2016 Goal Status: Active Interventions: Assess comfort goal upon admission Complete pain assessment as per visit requirements Notes: Pressure Nursing Diagnoses: Knowledge deficit related to causes and risk factors for pressure ulcer development Knowledge deficit related to management of pressures ulcers Goals: Patient/caregiver will verbalize risk factors for pressure ulcer development Date Initiated: 03/01/2016 Goal Status: Active Interventions: Assess offloading mechanisms upon admission and as needed Notes: Wound/Skin Impairment Nursing Diagnoses: CLOY, COZZENS ([id number]) Impaired tissue integrity Goals: Ulcer/skin breakdown will have a volume reduction of 30% by week 4 Date Initiated: 03/01/2016 Goal Status: Active Ulcer/skin breakdown will have a volume reduction of 50% by week 8 Date Initiated: 03/01/2016 Goal Status: Active Ulcer/skin breakdown will have a volume reduction of 80% by week 12 Date Initiated: 03/01/2016 Goal Status: Active Interventions: Assess ulceration(s) every visit Notes: Electronic Signature(s) Signed: 11/12/2016 10:11:54 AM By: Elliot Gurney, RN, [name], Kim RN, [name] Signed: 11/12/2016 2:01:11 PM By: [name] Entered By: Elliot Gurney RN, [name], Kim on 11/12/2016 10:11:52 [name] ([id number]) -------------------------------------------------------------------------------- Pain Assessment Details Patient Name: [name] Date of Service: [name] 1:15 PM Medical Record Number: [id number] Patient Account Number: [id number] Date of Birth/Sex: [birth year] (71 y.o. Male) Treating RN: [name] Primary Care Physician: [name] Other  Clinician:  Referring Physician: Oretha MilchSMITH, [name] Treating Physician/Extender: [name] ReBritto, Errol Weeks in Treatment: 8043 Active Problems Location of Pain Severity and Description of Pain Patient Has Paino No Site Locations With Dressing Change: No Pain Management and Medication Current Pain Management: Electronic Signature(s) Signed: 11/12/2016 2:01:11 PM By: [name] MullingPinkerton, Debra Entered By: [name] MullingPinkerton, Debra on [name] 13:22:45 [name] OttoLINDLEY, [name] ([id number]) -------------------------------------------------------------------------------- Patient/Caregiver Education Details Patient Name: [name] OttoLINDLEY, [name] Date of Service: [name] 1:15 PM Medical Record Number: [id number] Patient Account Number: [id number] Date of Birth/Gender: 07-03-1946 [age] y.o. Male) Treating RN: [name] HaggisPinkerton, Debi Primary Care Physician: [name] MilchSMITH, [name] Other Clinician: Referring Physician: Oretha MilchSMITH, [name] Treating Physician/Extender: [name] Cosieroulter, Leah Weeks in Treatment: 943 Education Assessment Education Provided To: Patient Education Topics Provided Wound/Skin Impairment: Handouts: Other: do not get wraps wet Methods: Demonstration, Explain/Verbal Responses: State content correctly Electronic Signature(s) Signed: 11/12/2016 2:01:11 PM By: [name] MullingPinkerton, Debra Entered By: [name] MullingPinkerton, Debra on [name] 15:05:29 Kaneshiro, Brees Seth BakeV. ([id number]) -------------------------------------------------------------------------------- Wound Assessment Details Patient Name: [name] OttoLINDLEY, [name] Date of Service: [name] 1:15 PM Medical Record Number: [id number] Patient Account Number: [id number] Date of Birth/Sex: 07-03-1946 [age] y.o. Male) Treating RN: [name] HaggisPinkerton, Debi Primary Care Physician: [name] MilchSMITH, [name] Other Clinician: Referring Physician: Oretha MilchSMITH, [name] Treating Physician/Extender: [name] Cosieroulter, Leah Weeks in Treatment: 4443 Wound Status Wound Number: 10 Primary Lymphedema Etiology: Wound Location: Right Lower Leg - Anterior Secondary Diabetic  Wound/Ulcer of the Lower Wounding Event: Blister Etiology: Extremity Date Acquired: 08/22/2016 Wound Open Weeks Of Treatment: 10 Status: Clustered Wound: No Comorbid Cataracts, Chronic Obstructive History: Pulmonary Disease (COPD), Sleep Apnea, Angina, Congestive Heart Failure, Hypertension, Type II Diabetes, Gout, Osteoarthritis, Neuropathy Photos Photo Uploaded By: [name] MullingPinkerton, Debra on [name] 16:00:49 Wound Measurements Length: (cm) 19.5 Width: (cm) 21.5 Depth: (cm) 0.1 Area: (cm) 329.278 Volume: (cm) 32.928 % Reduction in Area: -27852.3% % Reduction in Volume: -27805.1% Epithelialization: None Tunneling: No Undermining: No Wound Description Classification: Partial Thickness Diabetic Severity (Wagner): Grade 1 Wound Margin: Distinct, outline attach Exudate Amount: Large Exudate Type: Serous Exudate Color: amber [name], [name] ([id number]) Foul Odor After Cleansing: No ed Wound Bed Granulation Amount: Large (67-100%) Exposed Structure Granulation Quality: Pink, Pale Fascia Exposed: No Necrotic Amount: Small (1-33%) Fat Layer Exposed: No Necrotic Quality: Adherent Slough Tendon Exposed: No Muscle Exposed: No Joint Exposed: No Bone Exposed: No Limited to Skin Breakdown Periwound Skin Texture Texture Color No Abnormalities Noted: No No Abnormalities Noted: No Callus: No Atrophie Blanche: No Crepitus: No Cyanosis: No Excoriation: No Ecchymosis: No Fluctuance: No Erythema: No Friable: No Hemosiderin Staining: Yes Induration: No Mottled: Yes Localized Edema: Yes Pallor: No Rash: No Rubor: No Scarring: No Temperature / Pain Moisture Temperature: No Abnormality No Abnormalities Noted: No Tenderness on Palpation: Yes Dry / Scaly: No Maceration: Yes Moist: Yes Wound Preparation Ulcer Cleansing: Other: surg scrub and water, Topical Anesthetic Applied: None Electronic Signature(s) Signed: 11/12/2016 2:01:11 PM By: [name] MullingPinkerton, Debra Entered  By: [name] MullingPinkerton, Debra on [name] 13:52:27 Magan, Alexie VMarland Kitchen. ([id number]) -------------------------------------------------------------------------------- Wound Assessment Details Patient Name: [name] OttoLINDLEY, [name] Date of Service: [name] 1:15 PM Medical Record Number: [id number] Patient Account Number: [id number] Date of Birth/Sex: 07-03-1946 [age] y.o. Male) Treating RN: [name] HaggisPinkerton, Debi Primary Care Physician: [name] MilchSMITH, [name] Other Clinician: Referring Physician: Oretha MilchSMITH, [name] Treating Physician/Extender: [name] Cosieroulter, Leah Weeks in Treatment: 2343 Wound Status Wound Number: 3 Primary Pressure Ulcer Etiology: Wound Location: Right Calcaneus Wound Open Wounding Event: Pressure Injury Status: Date Acquired: 10/31/2015 Comorbid Cataracts, Chronic Obstructive Weeks Of Treatment: 43 History: Pulmonary Disease (COPD), Sleep Clustered Wound: No Apnea, Angina, Congestive Heart Failure, Hypertension, Type II Diabetes, Gout, Osteoarthritis,  Neuropathy Photos Photo Uploaded By: [name] 16:00:32 Wound Measurements Length: (cm) 0.6 Width: (cm) 2.2 Depth: (cm) 0.3 Area: (cm) 1.037 Volume: (cm) 0.311 % Reduction in Area: 85.3% % Reduction in Volume: 78% Epithelialization: None Tunneling: No Undermining: No Wound Description Classification: Category/Stage III Diabetic Severity [address]): Grade 1 Wound Margin: Distinct, outline attache Exudate Amount: Large Exudate Type: Serous Exudate Color: amber Foul Odor After Cleansing: No d Wound Bed Granulation Amount: None Present (0%) Exposed Structure [name], [name] ([id number]) Necrotic Amount: Large (67-100%) Fascia Exposed: No Necrotic Quality: Adherent Slough Fat Layer Exposed: No Tendon Exposed: No Muscle Exposed: No Joint Exposed: No Bone Exposed: No Limited to Skin Breakdown Periwound Skin Texture Texture Color No Abnormalities Noted: No No Abnormalities Noted: No Callus: No Atrophie Blanche:  No Crepitus: No Cyanosis: No Excoriation: No Ecchymosis: No Fluctuance: No Erythema: No Friable: No Hemosiderin Staining: Yes Induration: No Mottled: Yes Localized Edema: Yes Pallor: No Rash: No Rubor: No Scarring: No Temperature / Pain Moisture Temperature: No Abnormality No Abnormalities Noted: No Dry / Scaly: No Maceration: Yes Moist: Yes Wound Preparation Ulcer Cleansing: Other: surg scrub and water, Topical Anesthetic Applied: Other: lidocaine 4%, Electronic Signature(s) Signed: 11/12/2016 2:01:11 PM By: [name] Entered By: [name] 13:52:39 Hoying, Daxtyn V. ([id number]) -------------------------------------------------------------------------------- Wound Assessment Details Patient Name: [name] Date of Service: [name] 1:15 PM Medical Record Number: [id number] Patient Account Number: [id number] Date of Birth/Sex: [birth year] (71 y.o. Male) Treating RN: [name] Primary Care Physician: [name] Other Clinician: Referring Physician: [name] Treating Physician/Extender: [name] in Treatment: 19 Wound Status Wound Number: 9 Primary Diabetic Wound/Ulcer of the Lower Etiology: Extremity Wound Location: Left Lower Leg - Anterior Secondary Venous Leg Ulcer Wounding Event: Gradually Appeared Etiology: Date Acquired: 08/09/2016 Wound Open Weeks Of Treatment: 11 Status: Clustered Wound: Yes Comorbid Cataracts, Chronic Obstructive History: Pulmonary Disease (COPD), Sleep Apnea, Angina, Congestive Heart Failure, Hypertension, Type II Diabetes, Gout, Osteoarthritis, Neuropathy Photos Photo Uploaded By: [name] 16:00:32 Wound Measurements Length: (cm) 21 Width: (cm) 39 Depth: (cm) 0.1 Area: (cm) 643.241 Volume: (cm) 64.324 % Reduction in Area: -542.4% % Reduction in Volume: -542.3% Epithelialization: None Tunneling: No Undermining: No Wound  Description Classification: Grade 2 Foul Odor Afte Wound Margin: Distinct, outline attached Exudate Amount: Large Exudate Type: Serous Exudate Color: amber r Cleansing: No Wound Bed Cerreta, [name] V. ([id number]) Granulation Amount: Large (67-100%) Exposed Structure Granulation Quality: Red Fascia Exposed: No Necrotic Amount: Small (1-33%) Fat Layer Exposed: No Necrotic Quality: Adherent Slough Tendon Exposed: No Muscle Exposed: No Joint Exposed: No Bone Exposed: No Limited to Skin Breakdown Periwound Skin Texture Texture Color No Abnormalities Noted: No No Abnormalities Noted: No Callus: No Atrophie Blanche: No Crepitus: No Cyanosis: No Excoriation: No Ecchymosis: No Fluctuance: No Erythema: No Friable: No Hemosiderin Staining: Yes Induration: No Mottled: Yes Localized Edema: Yes Pallor: No Rash: No Rubor: No Scarring: No Temperature / Pain Moisture Temperature: No Abnormality No Abnormalities Noted: No Tenderness on Palpation: Yes Dry / Scaly: No Maceration: Yes Moist: Yes Wound Preparation Ulcer Cleansing: Other: surg scrub and water, Topical Anesthetic Applied: None Electronic Signature(s) Signed: 11/12/2016 2:01:11 PM By: [name] Entered By: [name] 13:52:15 Jiggetts, Gurtaj Seth [name] ([id number]) -------------------------------------------------------------------------------- Vitals Details Patient Name: [name] Date of Service: [name] 1:15 PM Medical Record Number: [id number] Patient Account Number: [id number] Date of Birth/Sex: 08/07/1946 (71 y.o. Male) Treating RN: [name] Primary Care Physician: [name] Other Clinician: Referring Physician:  Katrinka Blazing, [place] Treating Physician/Extender: [name] in Treatment: 37 Vital Signs Time Taken: 13:22 Temperature (F): 97.8 Height (in): 70 Pulse (bpm): 62 Weight (lbs): 235 Respiratory Rate (breaths/min): 16 Body Mass Index (BMI): 33.7 Blood  Pressure (mmHg): 133/66 Reference Range: 80 - 120 mg / dl Electronic Signature(s) Signed: 11/12/2016 2:01:11 PM By: [name] Entered By: [name] 13:23:53

## 2016-11-15 ENCOUNTER — Encounter: Payer: Medicare Other | Attending: Surgery | Admitting: Surgery

## 2016-11-15 DIAGNOSIS — E11621 Type 2 diabetes mellitus with foot ulcer: Secondary | ICD-10-CM | POA: Diagnosis not present

## 2016-11-15 DIAGNOSIS — G4733 Obstructive sleep apnea (adult) (pediatric): Secondary | ICD-10-CM | POA: Diagnosis not present

## 2016-11-15 DIAGNOSIS — Z87891 Personal history of nicotine dependence: Secondary | ICD-10-CM | POA: Diagnosis not present

## 2016-11-15 DIAGNOSIS — I890 Lymphedema, not elsewhere classified: Secondary | ICD-10-CM | POA: Insufficient documentation

## 2016-11-15 DIAGNOSIS — Z6833 Body mass index (BMI) 33.0-33.9, adult: Secondary | ICD-10-CM | POA: Diagnosis not present

## 2016-11-15 DIAGNOSIS — I70234 Atherosclerosis of native arteries of right leg with ulceration of heel and midfoot: Secondary | ICD-10-CM | POA: Diagnosis not present

## 2016-11-15 DIAGNOSIS — I5032 Chronic diastolic (congestive) heart failure: Secondary | ICD-10-CM | POA: Insufficient documentation

## 2016-11-15 DIAGNOSIS — J449 Chronic obstructive pulmonary disease, unspecified: Secondary | ICD-10-CM | POA: Diagnosis not present

## 2016-11-15 DIAGNOSIS — L89613 Pressure ulcer of right heel, stage 3: Secondary | ICD-10-CM | POA: Insufficient documentation

## 2016-11-15 DIAGNOSIS — I110 Hypertensive heart disease with heart failure: Secondary | ICD-10-CM | POA: Diagnosis not present

## 2016-11-15 DIAGNOSIS — I2510 Atherosclerotic heart disease of native coronary artery without angina pectoris: Secondary | ICD-10-CM | POA: Diagnosis not present

## 2016-11-15 DIAGNOSIS — M70871 Other soft tissue disorders related to use, overuse and pressure, right ankle and foot: Secondary | ICD-10-CM | POA: Diagnosis not present

## 2016-11-16 NOTE — Progress Notes (Signed)
MAVERIK, FOOT ([id number]) Visit Report for 11/15/2016 Chief Complaint Document Details Patient Name: ELAND, LAMANTIA. Date of Service: 11/15/2016 1:30 PM Medical Record Number: [id number] Patient Account Number: [id number] Date of Birth/Sex: 1946-10-21 (71 y.o. Male) Treating RN: [name], RN, [name], [place] Sink Primary Care Physician: Oretha Milch Other Clinician: Referring Physician: Oretha Milch Treating Physician/Extender: Rudene Re in Treatment: 45 Information Obtained from: Patient Chief Complaint Mr. Nouri presents for follow-up on his right heel pressure ulcer and bilateral lower extremity lymphedema and venous ulcerations. Electronic Signature(s) Signed: 11/15/2016 1:49:19 PM By: Evlyn Kanner MD, FACS Entered By: Evlyn Kanner on 11/15/2016 13:49:19 [name] ([id number]) -------------------------------------------------------------------------------- Debridement Details Patient Name: [name] Date of Service: 11/15/2016 1:30 PM Medical Record Number: [id number] Patient Account Number: [id number] Date of Birth/Sex: February 24, 1946 (71 y.o. Male) Treating RN: [name], RN, [name], [place] Sink Primary Care Physician: Oretha Milch Other Clinician: Referring Physician: Oretha Milch Treating Physician/Extender: Rudene Re in Treatment: 45 Debridement Performed for Wound #3 Right Calcaneus Assessment: Performed By: Physician Evlyn Kanner, MD Debridement: Debridement Pre-procedure Yes - 13:28 Verification/Time Out Taken: Start Time: 13:28 Pain Control: Lidocaine 4% Topical Solution Level: Skin/Subcutaneous Tissue Total Area Debrided (L x 0.8 (cm) x 2 (cm) = 1.6 (cm) W): Tissue and other Non-Viable, Fibrin/Slough, Subcutaneous material debrided: Instrument: Curette Bleeding: Minimum Hemostasis Achieved: Pressure End Time: 13:32 Procedural Pain: 3 Post Procedural Pain: 3 Response to Treatment: Procedure was tolerated well Post Debridement Measurements of Total  Wound Length: (cm) 0.8 Stage: Category/Stage III Width: (cm) 2 Depth: (cm) 0.2 Volume: (cm) 0.251 Character of Wound/Ulcer Post Stable Debridement: Severity of Tissue Post Fat layer exposed Debridement: Post Procedure Diagnosis Same as Pre-procedure Electronic Signature(s) Signed: 11/15/2016 1:49:13 PM By: Evlyn Kanner MD, FACS Signed: 11/15/2016 5:32:07 PM By: Elpidio Eric [name], RN Entered By: Evlyn Kanner on 11/15/2016 13:49:13 [name] ([id number]) [name], Ruffin VMarland Kitchen ([id number]) -------------------------------------------------------------------------------- HPI Details Patient Name: [name]. Date of Service: 11/15/2016 1:30 PM Medical Record Number: [id number] Patient Account Number: [id number] Date of Birth/Sex: [birth year] (71 y.o. Male) Treating RN: [name], RN, [name],  Sink Primary Care Physician: Oretha Milch Other Clinician: Referring Physician: Oretha Milch Treating Physician/Extender: Rudene Re in Treatment: 45 History of Present Illness Location: ulcerated area on the right heel, left gluteal region and thigh and then bilateral lower extremities Quality: Patient reports experiencing a sharp pain to affected area(s). Severity: Patient states wound are getting worse. Duration: Patient has had the wound for > 12 months prior to seeking treatment at the wound center Timing: Pain in wound is constant (hurts all the time) Context: The wound appeared gradually over time Modifying Factors: Other treatment(s) tried include: he sees his heart doctor and his primary care doctor Associated Signs and Symptoms: patient has not been able to walk for over a year now HPI Description: 71 year old gentleman with a known history of hypertension, diabetes, obstructive sleep apnea, COPD, diastolic CHF, coronary artery disease was admitted to the hospital with sepsis from an ulcer of the right heel and was treated there in October 2016. he also has chronic bilateral lower  extremity edema and lymphedema. He had received vancomycin, Zosyn and at that stage and x-rays showed hardware in the right ankle but no evidence of osteomyelitis. he was a former smoker. he was also treated with Augmentin orally for 10 days. He is either bedbound or wheelchair-bound and does not ambulate by himself. 01/19/2016 -- he has not been seen here for 3 weeks and this was because he was admitted  to [facility] between 223 and 01/08/2016 for sepsis, UTI and pneumonia involving the left lung. he was treated with IV vancomycin and Zosyn and then meropenem. Was discharged home on oral Bactrim for 2 weeks none of his vascular test or x-rays were done and we will reorder these. 01/26/2016 -- he has not yet done the x-ray of his foot and is vascular tests are still pending. I have asked him to work on these with his nursing home staff. Addendum: he has got an x-ray of the right foot done which shows that his osteopenia but no specific ostial lysis or abnormal periosteal reaction. Final impression was degenerative changes with dorsal foot soft tissue swelling. 02/16/2016 -- lower extremity arterial duplex examination shows a 50-99% stenosis of the right tibioperoneal trunk. He had biphasic flow in the right SFA, popliteal and tibioperoneal trunk. Left-sided he had triphasic flow throughout 02/29/2016 -- he is awaiting his vascular opinion with Dr. Gilda Crease which is to be done on April 24 03/08/2016 -- he has not kept his appointment with Dr. Gilda Crease on April 24 and does not seem to know what happened about this. it's difficult to gauge whether he is in full control of his mental faculties as at times he is extremely rude to the nursing staff. 03/22/2016 -- the patient was seen by Dr. 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 Recommended that the patient had  severe atherosclerotic changes of both lower extremities associated with ulceration and tissue loss of the foot. This is a limb threatening ischemia and the patient was recommended to undergo Verga, Ersel V. ([id number]) angiography of the lower extremity with a hope for intervention for limb salvage. Patient agreed and will proceed to angiography. He was admitted to the hospital between May 2 and 03/15/2016 - he underwent induction of a catheter into his right lower extremity and third order catheter placement with contrast injection to the right lower extremity for distal runoff. Percutaneous transluminal angioplasty of the right superficial femoral and popliteal arteries were done. He also had a right peroneal angioplasty. Patient had a postoperative hematoma and was admitted for observation and in the next 24 hours he was very agitated and combative and had to be seen by psychiatric. He had a follow-up with Dr. Gilda Crease in 3 weeks. 04/18/2016 -- notes reviewed from the vascular office where he was seen for his postop visit after the PTA of the right SFA and popliteal arteries on 03/12/2016. He underwent an ABI which showed right ABI to be more than 1.3 and left to be more than 1 more than 1.3, great toe and PPG waveforms are decreased bilaterally. No additional intervention indicated at this time and the patient was to follow-up in 3 months with an ABI and bilateral lower extremity duplex study. 05/02/2016 -- he complains that his compression stockings are causing him a lot of discomfort and he is not happy wearing them. 05/30/2016 -- the swelling of both legs has increased again and he has had blisters which have opened out into ulcerations. 09/12/2016 -- the lymphedema pumps are still not delivered and hopefully will be done within this week. As far as his skin substitute goes we are still awaiting a response from his insurance 10-10-2016: Mr. Marchiano presents today and states that his  left edema pumps have arrived but the he has only received treatment "5 times" since arrival. He also  denies using the offloading boot for the right heel but admits to having the boot. He states that staff is hesitant to assist and application of the lymphedema pumps. 10/17/2016 -- I understand he continues to take his diuretics and has been using his lymphedema pump once a day for about an hour. He cannot do it twice a day. 10-31-16 Mr. Hagood presents today for follow-up on his bilateral lower extremity venous ulcers and his right posterior heel pressure ulcer. He states that he continues to do his lymphedema pumps once daily, unable to get assistance for twice daily. he does admit to increased drainage to the left leg more than the right leg. He denies any pain, fever, chills or overall ill feeling. 11/08/2016 -- the skin substitute which was run through his insurance company has a high copayment and he is not agreeable about getting this Electronic Signature(s) Signed: 11/15/2016 1:49:22 PM By: Evlyn Kanner MD, FACS Entered By: Evlyn Kanner on 11/15/2016 13:49:22 [name] ([id number]) -------------------------------------------------------------------------------- Physical Exam Details Patient Name: [name]. Date of Service: 11/15/2016 1:30 PM Medical Record Number: [id number] Patient Account Number: [id number] Date of Birth/Sex: 10-Jul-1946 (71 y.o. Male) Treating RN: [name], RN, [name], [place] Sink Primary Care Physician: Oretha Milch Other Clinician: Referring Physician: Oretha Milch Treating Physician/Extender: Rudene Re in Treatment: 45 Constitutional . Pulse regular. Respirations normal and unlabored. Afebrile. . Eyes Nonicteric. Reactive to light. Ears, Nose, Mouth, and Throat Lips, teeth, and gums WNL.Marland Kitchen Moist mucosa without lesions. Neck supple and nontender. No palpable supraclavicular or cervical adenopathy. Normal sized without goiter. Respiratory WNL. No  retractions.. Cardiovascular Pedal Pulses WNL. No clubbing, cyanosis or edema. Lymphatic No adneopathy. No adenopathy. No adenopathy. Musculoskeletal Adexa without tenderness or enlargement.. Digits and nails w/o clubbing, cyanosis, infection, petechiae, ischemia, or inflammatory conditions.. Integumentary (Hair, Skin) No suspicious lesions. No crepitus or fluctuance. No peri-wound warmth or erythema. No masses.Marland Kitchen Psychiatric Judgement and insight Intact.. No evidence of depression, anxiety, or agitation.. Notes lymphedema both lower extremities is better and the excoriation is less with shallow ulcers. The right calcaneum and tenuous to be macerated and has some subcutaneous debris which have sharply removed with #3 curet and bleeding controlled with pressure Electronic Signature(s) Signed: 11/15/2016 1:50:02 PM By: Evlyn Kanner MD, FACS Entered By: Evlyn Kanner on 11/15/2016 13:50:01 [name] ([id number]) -------------------------------------------------------------------------------- Physician Orders Details Patient Name: [name]. Date of Service: 11/15/2016 1:30 PM Medical Record Number: [id number] Patient Account Number: [id number] Date of Birth/Sex: 09/08/1946 (71 y.o. Male) Treating RN: [name], RN, [name], [place] Sink Primary Care Physician: Oretha Milch Other Clinician: Referring Physician: Oretha Milch Treating Physician/Extender: Rudene Re in Treatment: 43 Verbal / Phone Orders: No Diagnosis Coding Wound Cleansing Wound #10 Right,Anterior Lower Leg o No tub bath. - only sink bath or bed bath Wound #3 Right Calcaneus o No tub bath. - only sink bath or bed bath Wound #9 Left,Circumferential Lower Leg o No tub bath. - only sink bath or bed bath Anesthetic Wound #10 Right,Anterior Lower Leg o Topical Lidocaine 4% cream applied to wound bed prior to debridement - for clinic use only Wound #3 Right Calcaneus o Topical Lidocaine 4% cream applied to  wound bed prior to debridement - for clinic use only Wound #9 Left,Circumferential Lower Leg o Topical Lidocaine 4% cream applied to wound bed prior to debridement - for clinic use only Skin Barriers/Peri-Wound Care Wound #10 Right,Anterior Lower Leg o Antifungal cream - Nystatin o Triamcinolone Acetonide Ointment Wound #3 Right Calcaneus o Antifungal  cream - Nystatin o Triamcinolone Acetonide Ointment Wound #9 Left,Circumferential Lower Leg o Antifungal cream - Nystatin o Triamcinolone Acetonide Ointment Primary Wound Dressing Wound #10 Right,Anterior Lower Leg o Aquacel Ag Dubuc, Godric V. ([id number]) Wound #3 Right Calcaneus o Aquacel Ag Wound #9 Left,Circumferential Lower Leg o Aquacel Ag Secondary Dressing Wound #10 Right,Anterior Lower Leg o ABD pad o XtraSorb Wound #3 Right Calcaneus o ABD pad o Drawtex Wound #9 Left,Circumferential Lower Leg o ABD pad o XtraSorb Dressing Change Frequency Wound #10 Right,Anterior Lower Leg o Change dressing every week Wound #3 Right Calcaneus o Change dressing every week Wound #9 Left,Circumferential Lower Leg o Change dressing every week Follow-up Appointments Wound #10 Right,Anterior Lower Leg o Return Appointment in 1 week. Wound #3 Right Calcaneus o Return Appointment in 1 week. Wound #9 Left,Circumferential Lower Leg o Return Appointment in 1 week. Edema Control Wound #10 Right,Anterior Lower Leg o 3 Layer Compression System - Bilateral o Compression Pump: Use compression pump on left lower extremity for 30 minutes, twice daily. o Compression Pump: Use compression pump on right lower extremity for 30 minutes, twice daily. o Other: - unna to anchor Mickley, Bartholomew V. ([id number]) Wound #3 Right Calcaneus o 3 Layer Compression System - Bilateral o Compression Pump: Use compression pump on left lower extremity for 30 minutes, twice daily. o Compression Pump:  Use compression pump on right lower extremity for 30 minutes, twice daily. o Other: - unna to anchor Wound #9 Left,Circumferential Lower Leg o 3 Layer Compression System - Bilateral o Compression Pump: Use compression pump on left lower extremity for 30 minutes, twice daily. o Compression Pump: Use compression pump on right lower extremity for 30 minutes, twice daily. o Other: - unna to anchor Off-Loading Wound #10 Right,Anterior Lower Leg o Turn and reposition every 2 hours o Other: - sage boots at night and float heel when lying in bed *******lymphedema pumps to be placed on pt twice a day for one hour sessions******** Wound #3 Right Calcaneus o Turn and reposition every 2 hours o Other: - sage boots at night and float heel when lying in bed *******lymphedema pumps to be placed on pt twice a day for one hour sessions******** Wound #9 Left,Circumferential Lower Leg o Turn and reposition every 2 hours o Other: - sage boots at night and float heel when lying in bed *******lymphedema pumps to be placed on pt twice a day for one hour sessions******** Additional Orders / Instructions Wound #10 Right,Anterior Lower Leg o Increase protein intake. Wound #3 Right Calcaneus o Increase protein intake. Wound #9 Left,Circumferential Lower Leg o Increase protein intake. Medications-please add to medication list. Wound #10 Right,Anterior Lower Leg Magnussen, Ramesh V. ([id number]) o Other: - Vitamin C, Vitamin A, Zinc, multivitamin Wound #3 Right Calcaneus o Other: - Vitamin C, Vitamin A, Zinc, multivitamin Wound #9 Left,Circumferential Lower Leg o Other: - Vitamin C, Vitamin A, Zinc, multivitamin Notes May use Endoform next week when available Electronic Signature(s) Signed: 11/15/2016 4:52:44 PM By: Evlyn Kanner MD, FACS Signed: 11/15/2016 5:32:07 PM By: Elpidio Eric [name], RN Entered By: Elpidio Eric on 11/15/2016 13:57:05 [name]  ([id number]) -------------------------------------------------------------------------------- Problem List Details Patient Name: [name]. Date of Service: 11/15/2016 1:30 PM Medical Record Number: [id number] Patient Account Number: [id number] Date of Birth/Sex: 08-16-1946 (71 y.o. Male) Treating RN: [name], RN, [name],  Sink Primary Care Physician: Oretha Milch Other Clinician: Referring Physician: Oretha Milch Treating Physician/Extender: Rudene Re in Treatment: 90 Active Problems ICD-10 Encounter Code Description Active  Date Diagnosis E11.621 Type 2 diabetes mellitus with foot ulcer 01/01/2016 Yes L89.613 Pressure ulcer of right heel, stage 3 01/01/2016 Yes I89.0 Lymphedema, not elsewhere classified 01/01/2016 Yes E66.01 Morbid (severe) obesity due to excess calories 01/01/2016 Yes M70.871 Other soft tissue disorders related to use, overuse and 01/19/2016 Yes pressure, right ankle and foot 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/16/2016 Yes ulceration of heel and midfoot Inactive Problems Resolved Problems ICD-10 Code Description Active Date Resolved Date L89.322 Pressure ulcer of left buttock, stage 2 01/01/2016 01/01/2016 Electronic Signature(s) Signed: 11/15/2016 1:48:54 PM By: Evlyn Kanner MD, FACS Entered By: Evlyn Kanner on 11/15/2016 13:48:54 [name] ([id number]) [name], Romel VMarland Kitchen ([id number]) -------------------------------------------------------------------------------- Progress Note Details Patient Name: [name]. Date of Service: 11/15/2016 1:30 PM Medical Record Number: [id number] Patient Account Number: [id number] Date of Birth/Sex: Apr 23, 1946 (71 y.o. Male) Treating RN: [name], RN, [name],  Sink Primary Care Physician: Oretha Milch Other Clinician: Referring Physician: Oretha Milch Treating Physician/Extender: Rudene Re in Treatment: 45 Subjective Chief Complaint Information obtained from Patient Mr. Daluz  presents for follow-up on his right heel pressure ulcer and bilateral lower extremity lymphedema and venous ulcerations. History of Present Illness (HPI) The following HPI elements were documented for the patient's wound: Location: ulcerated area on the right heel, left gluteal region and thigh and then bilateral lower extremities Quality: Patient reports experiencing a sharp pain to affected area(s). Severity: Patient states wound are getting worse. Duration: Patient has had the wound for > 12 months prior to seeking treatment at the wound center Timing: Pain in wound is constant (hurts all the time) Context: The wound appeared gradually over time Modifying Factors: Other treatment(s) tried include: he sees his heart doctor and his primary care doctor Associated Signs and Symptoms: patient has not been able to walk for over a year now 71 year old gentleman with a known history of hypertension, diabetes, obstructive sleep apnea, COPD, diastolic CHF, coronary artery disease was admitted to the hospital with sepsis from an ulcer of the right heel and was treated there in October 2016. he also has chronic bilateral lower extremity edema and lymphedema. He had received vancomycin, Zosyn and at that stage and x-rays showed hardware in the right ankle but no evidence of osteomyelitis. he was a former smoker. he was also treated with Augmentin orally for 10 days. He is either bedbound or wheelchair-bound and does not ambulate by himself. 01/19/2016 -- he has not been seen here for 3 weeks and this was because he was admitted to [facility] between 223 and 01/08/2016 for sepsis, UTI and pneumonia involving the left lung. he was treated with IV vancomycin and Zosyn and then meropenem. Was discharged home on oral Bactrim for 2 weeks none of his vascular test or x-rays were done and we will reorder these. 01/26/2016 -- he has not yet done the x-ray of his foot and is vascular tests  are still pending. I have asked him to work on these with his nursing home staff. Addendum: he has got an x-ray of the right foot done which shows that his osteopenia but no specific ostial lysis or abnormal periosteal reaction. Final impression was degenerative changes with dorsal foot soft tissue swelling. 02/16/2016 -- lower extremity arterial duplex examination shows a 50-99% stenosis of the right tibioperoneal trunk. He had biphasic flow in the right SFA, popliteal and tibioperoneal trunk. Left-sided he had triphasic flow throughout LEEVI, CULLARS ([id number]) 02/29/2016 -- he is awaiting his vascular opinion  with Dr. Gilda Crease which is to be done on April 24 03/08/2016 -- he has not kept his appointment with Dr. Gilda Crease on April 24 and does not seem to know what happened about this. it's difficult to gauge whether he is in full control of his mental faculties as at times he is extremely rude to the nursing staff. 03/22/2016 -- the patient was seen by Dr. 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 Recommended that the patient had severe atherosclerotic changes of both lower extremities associated with ulceration and tissue loss of the foot. This is a limb threatening ischemia and the patient was recommended to undergo angiography of the lower extremity with a hope for intervention for limb salvage. Patient agreed and will proceed to angiography. He was admitted to the hospital between May 2 and 03/15/2016 - he underwent induction of a catheter into his right lower extremity and third order catheter placement with contrast injection to the right lower extremity for distal runoff. Percutaneous transluminal angioplasty of the right superficial femoral and popliteal arteries were done. He also had a right peroneal angioplasty. Patient had a postoperative hematoma and was admitted  for observation and in the next 24 hours he was very agitated and combative and had to be seen by psychiatric. He had a follow-up with Dr. Gilda Crease in 3 weeks. 04/18/2016 -- notes reviewed from the vascular office where he was seen for his postop visit after the PTA of the right SFA and popliteal arteries on 03/12/2016. He underwent an ABI which showed right ABI to be more than 1.3 and left to be more than 1 more than 1.3, great toe and PPG waveforms are decreased bilaterally. No additional intervention indicated at this time and the patient was to follow-up in 3 months with an ABI and bilateral lower extremity duplex study. 05/02/2016 -- he complains that his compression stockings are causing him a lot of discomfort and he is not happy wearing them. 05/30/2016 -- the swelling of both legs has increased again and he has had blisters which have opened out into ulcerations. 09/12/2016 -- the lymphedema pumps are still not delivered and hopefully will be done within this week. As far as his skin substitute goes we are still awaiting a response from his insurance 10-10-2016: Mr. Stickels presents today and states that his left edema pumps have arrived but the he has only received treatment "5 times" since arrival. He also denies using the offloading boot for the right heel but admits to having the boot. He states that staff is hesitant to assist and application of the lymphedema pumps. 10/17/2016 -- I understand he continues to take his diuretics and has been using his lymphedema pump once a day for about an hour. He cannot do it twice a day. 10-31-16 Mr. Gelles presents today for follow-up on his bilateral lower extremity venous ulcers and his right posterior heel pressure ulcer. He states that he continues to do his lymphedema pumps once daily, unable to get assistance for twice daily. he does admit to increased drainage to the left leg more than the right leg. He denies any pain, fever, chills or  overall ill feeling. 11/08/2016 -- the skin substitute which was run through his insurance company has a high copayment and he is not agreeable about getting this Objective Graveline, Login V. ([id number]) Constitutional Pulse regular. Respirations normal and unlabored. Afebrile. Vitals Time Taken: 1:09  PM, Height: 70 in, Weight: 235 lbs, BMI: 33.7, Temperature: 97.9 F, Pulse: 63 bpm, Respiratory Rate: 18 breaths/min, Blood Pressure: 147/67 mmHg. Eyes Nonicteric. Reactive to light. Ears, Nose, Mouth, and Throat Lips, teeth, and gums WNL.Marland Kitchen. Moist mucosa without lesions. Neck supple and nontender. No palpable supraclavicular or cervical adenopathy. Normal sized without goiter. Respiratory WNL. No retractions.. Cardiovascular Pedal Pulses WNL. No clubbing, cyanosis or edema. Lymphatic No adneopathy. No adenopathy. No adenopathy. Musculoskeletal Adexa without tenderness or enlargement.. Digits and nails w/o clubbing, cyanosis, infection, petechiae, ischemia, or inflammatory conditions.Marland Kitchen. Psychiatric Judgement and insight Intact.. No evidence of depression, anxiety, or agitation.. General Notes: lymphedema both lower extremities is better and the excoriation is less with shallow ulcers. The right calcaneum and tenuous to be macerated and has some subcutaneous debris which have sharply removed with #3 curet and bleeding controlled with pressure Integumentary (Hair, Skin) No suspicious lesions. No crepitus or fluctuance. No peri-wound warmth or erythema. No masses.. Wound #10 status is Open. Original cause of wound was Blister. The wound is located on the Right,Anterior Lower Leg. The wound measures 4cm length x 13cm width x 0.1cm depth; 40.841cm^2 area and 4.084cm^3 volume. The wound is limited to skin breakdown. There is no tunneling or undermining noted. There is a large amount of serous drainage noted. The wound margin is flat and intact. There is large (67- 100%) red granulation within  the wound bed. There is no necrotic tissue within the wound bed. The periwound skin appearance exhibited: Localized Edema, Moist. The periwound skin appearance did not exhibit: Callus, Crepitus, Excoriation, Fluctuance, Friable, Induration, Rash, Scarring, Dry/Scaly, Maceration, Atrophie Blanche, Cyanosis, Ecchymosis, Hemosiderin Staining, Mottled, Pallor, Rubor, Erythema. Periwound temperature was noted as No Abnormality. The periwound has tenderness on Caulder, Wess V. ([id number]) palpation. Wound #3 status is Open. Original cause of wound was Pressure Injury. The wound is located on the Right Calcaneus. The wound measures 0.8cm length x 2cm width x 0.2cm depth; 1.257cm^2 area and 0.251cm^3 volume. The wound is limited to skin breakdown. There is no tunneling or undermining noted. There is a large amount of serous drainage noted. The wound margin is flat and intact. There is medium (34-66%) pink granulation within the wound bed. There is a medium (34-66%) amount of necrotic tissue within the wound bed including Adherent Slough. The periwound skin appearance exhibited: Localized Edema, Maceration, Moist. The periwound skin appearance did not exhibit: Callus, Crepitus, Excoriation, Fluctuance, Friable, Induration, Rash, Scarring, Dry/Scaly, Atrophie Blanche, Cyanosis, Ecchymosis, Hemosiderin Staining, Mottled, Pallor, Rubor, Erythema. The periwound has tenderness on palpation. Wound #9 status is Open. Original cause of wound was Gradually Appeared. The wound is located on the Left,Circumferential Lower Leg. The wound measures 15cm length x 23cm width x 0.1cm depth; 270.962cm^2 area and 27.096cm^3 volume. The wound is limited to skin breakdown. There is no tunneling or undermining noted. There is a large amount of serous drainage noted. The wound margin is flat and intact. There is large (67-100%) pink granulation within the wound bed. There is no necrotic tissue within the wound bed. The  periwound skin appearance exhibited: Localized Edema, Maceration, Mottled. The periwound skin appearance did not exhibit: Callus, Crepitus, Excoriation, Fluctuance, Friable, Induration, Rash, Scarring, Dry/Scaly, Moist, Atrophie Blanche, Cyanosis, Ecchymosis, Hemosiderin Staining, Pallor, Rubor, Erythema. The periwound has tenderness on palpation. 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 There was a Skin/Subcutaneous Tissue Debridement ([id number]) debridement with total area of 1.6 sq cm performed by Evlyn Kanner, MD. with the following instrument(s): Curette to remove Non-Viable tissue/material including Fibrin/Slough and Subcutaneous after achieving pain control using Lidocaine 4% Topical Solution. A time out was conducted at 13:28, prior to the start of the procedure. A Minimum amount of bleeding was controlled with Pressure. Welle, Iori V. ([id number]) The procedure was tolerated well with a pain level of 3 throughout and a pain level of 3 following the procedure. Post Debridement Measurements: 0.8cm length x 2cm width x 0.2cm depth; 0.251cm^3 volume. Post debridement Stage noted as Category/Stage III. Character of Wound/Ulcer Post Debridement is stable. Severity of Tissue Post Debridement is: Fat layer exposed. Post procedure Diagnosis Wound #3: Same as Pre-Procedure Plan Wound Cleansing: Wound #10 Right,Anterior Lower Leg: No tub bath. - only sink bath or bed bath Wound #3 Right Calcaneus: No tub bath. - only sink  bath or bed bath Wound #9 Left,Circumferential Lower Leg: No tub bath. - only sink bath or bed bath Anesthetic: Wound #10 Right,Anterior Lower Leg: Topical Lidocaine 4% cream applied to wound bed prior to debridement - for clinic use only Wound #3 Right Calcaneus: Topical Lidocaine 4% cream applied to wound bed prior to debridement - for clinic use only Wound #9 Left,Circumferential Lower Leg: Topical Lidocaine 4% cream applied to wound bed prior to debridement - for clinic use only Skin Barriers/Peri-Wound Care: Wound #10 Right,Anterior Lower Leg: Antifungal cream - Nystatin Triamcinolone Acetonide Ointment Wound #3 Right Calcaneus: Antifungal cream - Nystatin Triamcinolone Acetonide Ointment Wound #9 Left,Circumferential Lower Leg: Antifungal cream - Nystatin Triamcinolone Acetonide Ointment Primary Wound Dressing: Wound #10 Right,Anterior Lower Leg: Aquacel Ag Wound #3 Right Calcaneus: Aquacel Ag Wound #9 Left,Circumferential Lower Leg: Aquacel Ag Secondary Dressing: Wound #10 Right,Anterior Lower Leg: ABD pad XtraSorb Wound #3 Right Calcaneus: Henard, Kohlton V. ([id number]) ABD pad Drawtex Wound #9 Left,Circumferential Lower Leg: ABD pad XtraSorb Dressing Change Frequency: Wound #10 Right,Anterior Lower Leg: Change dressing every week Wound #3 Right Calcaneus: Change dressing every week Wound #9 Left,Circumferential Lower Leg: Change dressing every week Follow-up Appointments: Wound #10 Right,Anterior Lower Leg: Return Appointment in 1 week. Wound #3 Right Calcaneus: Return Appointment in 1 week. Wound #9 Left,Circumferential Lower Leg: Return Appointment in 1 week. Edema Control: Wound #10 Right,Anterior Lower Leg: 3 Layer Compression System - Bilateral Compression Pump: Use compression pump on left lower extremity for 30 minutes, twice daily. Compression Pump: Use compression pump on right lower extremity for 30 minutes, twice daily. Other: - unna to  anchor Wound #3 Right Calcaneus: 3 Layer Compression System - Bilateral Compression Pump: Use compression pump on left lower extremity for 30 minutes, twice daily. Compression Pump: Use compression pump on right lower extremity for 30 minutes, twice daily. Other: - unna to anchor Wound #9 Left,Circumferential Lower Leg: 3 Layer Compression System - Bilateral Compression Pump: Use compression pump on left lower extremity for 30 minutes, twice daily. Compression Pump: Use compression pump on right lower extremity for 30 minutes, twice daily. Other: - unna to anchor Off-Loading: Wound #10 Right,Anterior Lower Leg: Turn and reposition every 2 hours Other: - sage boots at night and float heel when lying in  bed *******lymphedema pumps to be placed on pt twice a day for one hour sessions******** Wound #3 Right Calcaneus: Turn and reposition every 2 hours Other: - sage boots at night and float heel when lying in bed *******lymphedema pumps to be placed on pt twice a day for one hour sessions******** Wound #9 Left,Circumferential Lower Leg: Turn and reposition every 2 hours Other: - sage boots at night and float heel when lying in bed *******lymphedema pumps to be placed on pt twice a day for one hour sessions******** Additional Orders / Instructions: Wound #10 Right,Anterior Lower Leg: Increase protein intake. Dehart, Alexandre V. ([id number]) Wound #3 Right Calcaneus: Increase protein intake. Wound #9 Left,Circumferential Lower Leg: Increase protein intake. Medications-please add to medication list.: Wound #10 Right,Anterior Lower Leg: Other: - Vitamin C, Vitamin A, Zinc, multivitamin Wound #3 Right Calcaneus: Other: - Vitamin C, Vitamin A, Zinc, multivitamin Wound #9 Left,Circumferential Lower Leg: Other: - Vitamin C, Vitamin A, Zinc, multivitamin General Notes: May use Endoform next week when available I have recommended : 1. using Silver alginate and Drawtex for his heel to be  changed every week 2. he has received his lymphedema pumps which hopefully will make a difference to his overall general wound healing. he is finding it difficult to get someone to help him put these on and off at his nursing home and we will attempt to talk to the nursing supervisor there. at present he is using them once a day only. 3. We will continue with silver alginate and a 3 layer Profore. 4. next week I would like to use some Endoform on his right calcaneum and leave it in place for the week. Electronic Signature(s) Signed: 11/15/2016 3:02:20 PM By: Evlyn Kanner MD, FACS Previous Signature: 11/15/2016 1:52:48 PM Version By: Evlyn Kanner MD, FACS Entered By: Evlyn Kanner on 11/15/2016 15:02:19 [name] ([id number]) -------------------------------------------------------------------------------- SuperBill Details Patient Name: [name]. Date of Service: 11/15/2016 Medical Record Number: [id number] Patient Account Number: [id number] Date of Birth/Sex: 1946/07/16 (71 y.o. 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 Facility Procedures CPT4: Description Modifier Quantity Code [id number] 11042 - DEB SUBQ TISSUE 20 SQ CM/< 1 ICD-10 Description Diagnosis E11.621 Type 2 diabetes mellitus with foot ulcer L89.613 Pressure ulcer of right heel, stage 3 I89.0 Lymphedema, not elsewhere classified  I70.234  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

## 2016-11-16 NOTE — Progress Notes (Signed)
[name], [name] ([id number]) Visit Report for 11/15/2016 Arrival Information Details Patient Name: [name], [name] Date of Service: 11/15/2016 1:30 PM Medical Record Number: [id number] Patient Account Number: [id number] Date of Birth/Sex: 05/13/1946 (71 y.o. Male) Treating RN: [name], RN, [name], [place] Sink Primary Care Physician: Oretha Milch Other Clinician: Referring Physician: Oretha Milch Treating Physician/Extender: Rudene Re in Treatment: 45 Visit Information History Since Last Visit All ordered tests and consults were completed: No Patient Arrived: Wheel Chair Added or deleted any medications: No Arrival Time: 13:05 Any new allergies or adverse reactions: No Accompanied By: self Had a fall or experienced change in No activities of daily living that may affect Transfer Assistance: None risk of falls: Patient Identification Verified: Yes Signs or symptoms of abuse/neglect since last No Secondary Verification Process Yes visito Completed: Hospitalized since last visit: No Patient Requires Transmission-Based No Has Compression in Place as Prescribed: Yes Precautions: Pain Present Now: Yes Patient Has Alerts: Yes Patient Alerts: DM II Electronic Signature(s) Signed: 11/15/2016 5:32:07 PM By: Elpidio Eric [name], RN Entered By: Elpidio Eric on 11/15/2016 13:08:56 [name] ([id number]) -------------------------------------------------------------------------------- Encounter Discharge Information Details Patient Name: [name] Date of Service: 11/15/2016 1:30 PM Medical Record Number: [id number] Patient Account Number: [id number] Date of Birth/Sex: 17-Sep-1946 (71 y.o. Male) Treating RN: [name], RN, [name], [place] Sink Primary Care Physician: Oretha Milch Other Clinician: Referring Physician: Oretha Milch Treating Physician/Extender: Rudene Re in Treatment: 62 Encounter Discharge Information Items Discharge Pain Level: 0 Discharge Condition: Stable Ambulatory  Status: Wheelchair Discharge Destination: Nursing Home Transportation: Other Accompanied By: self Schedule Follow-up Appointment: No Medication Reconciliation completed No and provided to Patient/Care [name]: Provided on Clinical Summary of Care: 11/15/2016 Form Type Recipient Paper Patient EL Electronic Signature(s) Signed: 11/15/2016 5:32:07 PM By: Elpidio Eric [name], RN Previous Signature: 11/15/2016 1:56:30 PM Version By: Gwenlyn Perking Entered By: Elpidio Eric on 11/15/2016 13:58:33 [name] ([id number]) -------------------------------------------------------------------------------- Lower Extremity Assessment Details Patient Name: [name] Date of Service: 11/15/2016 1:30 PM Medical Record Number: [id number] Patient Account Number: [id number] Date of Birth/Sex: 05/23/1946 (71 y.o. Male) Treating RN: [name], RN, [name], [place] Sink Primary Care Physician: Oretha Milch Other Clinician: Referring Physician: Oretha Milch Treating Physician/Extender: Rudene Re in Treatment: 45 Edema Assessment Assessed: [Left: No] [Right: No] E[Left: dema] [Right: :] Calf Left: Right: Point of Measurement: 34 cm From Medial Instep 41 cm 39.2 cm Ankle Left: Right: Point of Measurement: 12 cm From Medial Instep 27.2 cm 25.2 cm Vascular Assessment Claudication: Claudication Assessment [Left:None] [Right:None] Pulses: Dorsalis Pedis Palpable: [Left:Yes] [Right:Yes] Posterior Tibial Extremity colors, hair growth, and conditions: Extremity Color: [Left:Mottled] [Right:Mottled] Hair Growth on Extremity: [Left:No] [Right:No] Temperature of Extremity: [Left:Warm] [Right:Warm] Capillary Refill: [Left:< 3 seconds] [Right:< 3 seconds] Toe Nail Assessment Left: Right: Thick: Yes Yes Discolored: Yes Yes Deformed: No No Improper Length and Hygiene: Yes Yes Electronic Signature(s) Signed: 11/15/2016 5:32:07 PM By: Elpidio Eric [name], RN Entered By: Elpidio Eric on 11/15/2016 13:24:27 [name] ([id number]) [name], [name] ([id number]) -------------------------------------------------------------------------------- Multi Wound Chart Details Patient Name: [name] Date of Service: 11/15/2016 1:30 PM Medical Record Number: [id number] Patient Account Number: [id number] Date of Birth/Sex: Jun 04, 1946 (71 y.o. Male) Treating RN: [name], RN, [name],  Sink Primary Care Physician: Oretha Milch Other Clinician: Referring Physician: Oretha Milch Treating Physician/Extender: Rudene Re in Treatment: 45 Vital Signs Height(in): 70 Pulse(bpm): 63 Weight(lbs): 235 Blood Pressure 147/67 (mmHg): Body Mass Index(BMI): 34 Temperature(F): 97.9 Respiratory Rate 18 (breaths/min): Photos: [10:No Photos] [3:No Photos] [9:No Photos] Wound  Location: [10:Right Lower Leg - Anterior Right Calcaneus] [9:Left Lower Leg - Circumfernential] Wounding Event: [10:Blister] [3:Pressure Injury] [9:Gradually Appeared] Primary Etiology: [10:Lymphedema] [3:Pressure Ulcer] [9:Diabetic Wound/Ulcer of the Lower Extremity] Secondary Etiology: [10: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] [9:Venous Leg Ulcer] Comorbid History: [10:Cataracts, Chronic Obstructive Pulmonary Disease (COPD), Sleep Disease (COPD), Sleep Disease (COPD), Sleep Apnea, Angina, Congestive Heart Failure, Congestive Heart Failure, Congestive Heart Failure, Hypertension, Type II Diabetes,  Gout, Osteoarthritis, Neuropathy Osteoarthritis, Neuropathy Osteoarthritis, Neuropathy] [3:Cataracts, Chronic Obstructive Pulmonary Apnea, Angina, Hypertension, Type II Diabetes, Gout,] [9:Cataracts, Chronic Obstructive Pulmonary Apnea, Angina,  Hypertension, Type II Diabetes, Gout,] Date Acquired: [10:08/22/2016] [3:10/31/2015] [9:08/09/2016] Weeks of Treatment: [10:12] [3:45] [9:13] Wound Status: [10:Open] [3:Open] [9:Open] Clustered Wound: [10:No] [3:No] [9:Yes] Measurements L x W x D 4x13x0.1 [3:0.8x2x0.2] [9:15x23x0.1] (cm) Area  (cm) : [10:40.841] [3:1.257] [9:270.962] Volume (cm) : [10:4.084] [3:0.251] [9:27.096] % Reduction in Area: [10:-3367.00%] [3:82.20%] [9:-170.60%] % Reduction in Volume: -3361.00% [3:82.20%] [9:-170.60%] Classification: [10:Partial Thickness] [3:Category/Stage III] [9:Grade 2] HBO Classification: [10:Grade 1] [3:Grade 1] [9:N/A] Exudate Amount: [10:Large] [3:Large] [9:Large] Exudate Type: [10:Serous] [3:Serous] [9:Serous] Exudate Color: amber amber amber Wound Margin: Flat and Intact Flat and Intact Flat and Intact Granulation Amount: Large (67-100%) Medium (34-66%) Large (67-100%) Granulation Quality: Red Pink Pink Necrotic Amount: None Present (0%) Medium (34-66%) None Present (0%) Exposed Structures: Fascia: No Fascia: No Fascia: No Fat: No Fat: No Fat: No Tendon: No Tendon: No Tendon: No Muscle: No Muscle: No Muscle: No Joint: No Joint: No Joint: No Bone: No Bone: No Bone: No Limited to Skin Limited to Skin Limited to Skin Breakdown Breakdown Breakdown Epithelialization: Large (67-100%) None Medium (34-66%) Debridement: N/A Debridement ([id number] N/A 11047) Pre-procedure N/A 13:28 N/A Verification/Time Out Taken: Pain Control: N/A Lidocaine 4% Topical N/A Solution Tissue Debrided: N/A Fibrin/Slough, N/A Subcutaneous Level: N/A Skin/Subcutaneous N/A Tissue Debridement Area (sq N/A 1.6 N/A cm): Instrument: N/A Curette N/A Bleeding: N/A Minimum N/A Hemostasis Achieved: N/A Pressure N/A Procedural Pain: N/A 3 N/A Post Procedural Pain: N/A 3 N/A Debridement Treatment N/A Procedure was tolerated N/A Response: well Post Debridement N/A 0.8x2x0.2 N/A Measurements L x W x D (cm) Post Debridement N/A 0.251 N/A Volume: (cm) Post Debridement N/A Category/Stage III N/A Stage: Periwound Skin Texture: Edema: Yes Edema: Yes Edema: Yes Excoriation: No Excoriation: No Excoriation: No Induration: No Induration: No Induration: No Callus: No Callus: No Callus:  No Crepitus: No Crepitus: No Crepitus: No Fluctuance: No Fluctuance: No Fluctuance: No Friable: No Friable: No Friable: No Rash: No Rash: No Rash: No Scarring: No Scarring: No Scarring: No Larrick, [name] ([id number]) Periwound Skin Moist: Yes Maceration: Yes Maceration: Yes Moisture: Maceration: No Moist: Yes Moist: No Dry/Scaly: No Dry/Scaly: No Dry/Scaly: No Periwound Skin Color: Atrophie Blanche: No Atrophie Blanche: No Mottled: Yes Cyanosis: No Cyanosis: No Atrophie Blanche: No Ecchymosis: No Ecchymosis: No Cyanosis: No Erythema: No Erythema: No Ecchymosis: No Hemosiderin Staining: No Hemosiderin Staining: No Erythema: No Mottled: No Mottled: No Hemosiderin Staining: No Pallor: No Pallor: No Pallor: No Rubor: No Rubor: No Rubor: No Temperature: No Abnormality N/A N/A Tenderness on Yes Yes Yes Palpation: Wound Preparation: Ulcer Cleansing: Other: Ulcer Cleansing: Other: Ulcer Cleansing: Other: soap and water soap and water soap and water Topical Anesthetic Topical Anesthetic Topical Anesthetic Applied: None Applied: Other: lidocaine Applied: None 4% Procedures Performed: N/A Debridement N/A Treatment Notes Electronic Signature(s) Signed: 11/15/2016 1:49:04 PM By: Evlyn Kanner MD, FACS Entered By: Evlyn Kanner on 11/15/2016 13:49:04 [name] ([id number]) -------------------------------------------------------------------------------- Multi-Disciplinary Care Plan Details Patient Name:  Mosqueda, [name] Date of Service: 11/15/2016 1:30 PM Medical Record Number: [id number] Patient Account Number: [id number] Date of Birth/Sex: [birth year] (71 y.o. Male) Treating RN: [name], RN, [name], [place] Sink Primary Care Physician: Oretha Milch Other Clinician: Referring Physician: Oretha Milch Treating Physician/Extender: Rudene Re in Treatment: 59 Active Inactive Abuse / Safety / Falls / Self Care Management Nursing Diagnoses: Potential for  falls Goals: Patient will remain injury free Date Initiated: 03/01/2016 Goal Status: Active Interventions: Assess fall risk on admission and as needed Notes: Nutrition Nursing Diagnoses: Imbalanced nutrition Goals: Patient/caregiver agrees to and verbalizes understanding of need to use nutritional supplements and/or vitamins as prescribed Date Initiated: 03/01/2016 Goal Status: Active Interventions: Assess patient nutrition upon admission and as needed per policy Notes: Orientation to the Wound Care Program Nursing Diagnoses: Knowledge deficit related to the wound healing center program Goals: Patient/caregiver will verbalize understanding of the [facility]aling [facility] [address] [name], [name] ([id number]) Date Initiated: 03/01/2016 Goal Status: Active Interventions: Provide education on orientation to the wound center Notes: Pain, Acute or Chronic Nursing Diagnoses: Pain, acute or chronic: actual or potential Potential alteration in comfort, pain Goals: Patient will verbalize adequate pain control and receive pain control interventions during procedures as needed Date Initiated: 03/01/2016 Goal Status: Active Patient/caregiver will verbalize adequate pain control between visits Date Initiated: 03/01/2016 Goal Status: Active Interventions: Assess comfort goal upon admission Complete pain assessment as per visit requirements Notes: Pressure Nursing Diagnoses: Knowledge deficit related to causes and risk factors for pressure ulcer development Knowledge deficit related to management of pressures ulcers Goals: Patient/caregiver will verbalize risk factors for pressure ulcer development Date Initiated: 03/01/2016 Goal Status: Active Interventions: Assess offloading mechanisms upon admission and as needed Notes: Wound/Skin Impairment Nursing Diagnoses: [name], [name] ([id number]) Impaired tissue integrity Goals: Ulcer/skin breakdown will have a volume reduction of  30% by week 4 Date Initiated: 03/01/2016 Goal Status: Active Ulcer/skin breakdown will have a volume reduction of 50% by week 8 Date Initiated: 03/01/2016 Goal Status: Active Ulcer/skin breakdown will have a volume reduction of 80% by week 12 Date Initiated: 03/01/2016 Goal Status: Active Interventions: Assess ulceration(s) every visit Notes: Electronic Signature(s) Signed: 11/15/2016 5:32:07 PM By: Elpidio Eric [name], RN Entered By: Elpidio Eric on 11/15/2016 13:26:07 [name] ([id number]) -------------------------------------------------------------------------------- Pain Assessment Details Patient Name: [name] Date of Service: 11/15/2016 1:30 PM Medical Record Number: [id number] Patient Account Number: [id number] Date of Birth/Sex: 1946-03-13 (71 y.o. Male) Treating RN: [name], RN, [name], [place] Sink Primary Care Physician: Oretha Milch Other Clinician: Referring Physician: Oretha Milch Treating Physician/Extender: Rudene Re in Treatment: 45 Active Problems Location of Pain Severity and Description of Pain Patient Has Paino Yes Site Locations Pain Location: Pain in Ulcers With Dressing Change: No Character of Pain Describe the Pain: Tender Pain Management and Medication Current Pain Management: Medication: Yes Cold Application: No Rest: Yes Massage: No Activity: No T.E.N.S.: No Heat Application: No Leg drop or elevation: No Is the Current Pain Management Inadequate Adequate: How does your pain impact your activities of daily livingo Sleep: Yes Bathing: Yes Appetite: Yes Relationship With Others: Yes Bladder Continence: Yes Emotions: Yes Bowel Continence: Yes Work: Yes Toileting: Yes Drive: Yes Dressing: Yes Hobbies: Yes Electronic Signature(s) Signed: 11/15/2016 5:32:07 PM By: Elpidio Eric [name], RN [name], [name] ([id number]) Entered By: Elpidio Eric on 11/15/2016 13:08:24 [name]  ([id number]) -------------------------------------------------------------------------------- Patient/Caregiver Education Details Patient Name: [name] Date of Service: 11/15/2016 1:30 PM Medical Record Number: [id number] Patient Account Number: [id number] Date of  Birth/Gender: Jun 06, 1946 [age] y.o. Male) Treating RN: [name], RN, [name], [name] Primary Care Physician: Oretha MilchSMITH, [name] Other Clinician: Referring Physician: Oretha MilchSMITH, [name] Treating Physician/Extender: Rudene ReBritto, Errol Weeks in Treatment: 1245 Education Assessment Education Provided To: Patient Education Topics Provided Welcome To The Wound Care Center: Methods: Explain/Verbal Responses: State content correctly Electronic Signature(s) Signed: 11/15/2016 5:32:07 PM By: Elpidio EricAfful, Rita [name], RN Entered By: Elpidio EricAfful, Rita on 11/15/2016 13:58:49 [name] OttoLINDLEY, [name] ([id number]) -------------------------------------------------------------------------------- Wound Assessment Details Patient Name: [name] OttoLINDLEY, [name] Date of Service: 11/15/2016 1:30 PM Medical Record Number: [id number] Patient Account Number: [id number] Date of Birth/Sex: Jun 06, 1946 [age] y.o. Male) Treating RN: [name], RN, [name], Rita Primary Care Physician: Oretha MilchSMITH, [name] Other Clinician: Referring Physician: Oretha MilchSMITH, [name] Treating Physician/Extender: Rudene ReBritto, Errol Weeks in Treatment: 45 Wound Status Wound Number: 10 Primary Lymphedema Etiology: Wound Location: Right Lower Leg - Anterior Secondary Diabetic Wound/Ulcer of the Lower Wounding Event: Blister Etiology: Extremity Date Acquired: 08/22/2016 Wound Open Weeks Of Treatment: 12 Status: Clustered Wound: No Comorbid Cataracts, Chronic Obstructive History: Pulmonary Disease (COPD), Sleep Apnea, Angina, Congestive Heart Failure, Hypertension, Type II Diabetes, Gout, Osteoarthritis, Neuropathy Photos Photo Uploaded By: Elpidio EricAfful, Rita on 11/15/2016 17:29:59 Wound Measurements Length: (cm) 4 Width: (cm) 13 Depth: (cm) 0.1 Area:  (cm) 40.841 Volume: (cm) 4.084 % Reduction in Area: -3367% % Reduction in Volume: -3361% Epithelialization: Large (67-100%) Tunneling: No Undermining: No Wound Description Classification: Partial Thickness Diabetic Severity [address](Wagner): Grade 1 Wound Margin: Flat and Intact Exudate Amount: Large Exudate Type: Serous Exudate Color: amber Filley, Cinque V. ([id number]) Wound Bed Granulation Amount: Large (67-100%) Exposed Structure Granulation Quality: Red Fascia Exposed: No Necrotic Amount: None Present (0%) Fat Layer Exposed: No Tendon Exposed: No Muscle Exposed: No Joint Exposed: No Bone Exposed: No Limited to Skin Breakdown Periwound Skin Texture Texture Color No Abnormalities Noted: No No Abnormalities Noted: No Callus: No Atrophie Blanche: No Crepitus: No Cyanosis: No Excoriation: No Ecchymosis: No Fluctuance: No Erythema: No Friable: No Hemosiderin Staining: No Induration: No Mottled: No Localized Edema: Yes Pallor: No Rash: No Rubor: No Scarring: No Temperature / Pain Moisture Temperature: No Abnormality No Abnormalities Noted: No Tenderness on Palpation: Yes Dry / Scaly: No Maceration: No Moist: Yes Wound Preparation Ulcer Cleansing: Other: soap and water, Topical Anesthetic Applied: None Treatment Notes Wound #10 (Right, Anterior Lower Leg) 1. Cleansed with: Cleanse wound with antibacterial soap and water 4. Dressing Applied: Aquacel Ag 5. Secondary Dressing Applied ABD Pad 7. Secured with 3 Layer Compression System - Bilateral Notes xtrasorb, drawtex to heel Electronic Signature(s) [name] OttoLINDLEY, Treyvone V. ([id number]) Signed: 11/15/2016 5:32:07 PM By: Elpidio EricAfful, Rita [name], RN Entered By: Elpidio EricAfful, Rita on 11/15/2016 13:22:36 [name] OttoLINDLEY, [name] ([id number]) -------------------------------------------------------------------------------- Wound Assessment Details Patient Name: [name] OttoLINDLEY, [name] Date of Service: 11/15/2016 1:30 PM Medical Record Number:  [id number] Patient Account Number: [id number] Date of Birth/Sex: Jun 06, 1946 [age] y.o. Male) Treating RN: [name], RN, [name], Rita Primary Care Physician: Oretha MilchSMITH, [name] Other Clinician: Referring Physician: Oretha MilchSMITH, [name] Treating Physician/Extender: Rudene ReBritto, Errol Weeks in Treatment: 45 Wound Status Wound Number: 3 Primary Pressure Ulcer Etiology: Wound Location: Right Calcaneus Wound Open Wounding Event: Pressure Injury Status: Date Acquired: 10/31/2015 Comorbid Cataracts, Chronic Obstructive Weeks Of Treatment: 45 History: Pulmonary Disease (COPD), Sleep Clustered Wound: No Apnea, Angina, Congestive Heart Failure, Hypertension, Type II Diabetes, Gout, Osteoarthritis, Neuropathy Photos Photo Uploaded By: Elpidio EricAfful, Rita on 11/15/2016 17:30:58 Wound Measurements Length: (cm) 0.8 Width: (cm) 2 Depth: (cm) 0.2 Area: (cm) 1.257 Volume: (cm) 0.251 % Reduction in Area: 82.2% % Reduction in Volume: 82.2% Epithelialization: None Tunneling: No  Undermining: No Wound Description Classification: Category/Stage III Diabetic Severity [address]): Grade 1 [place], Warnie V. ([id number]) Foul Odor After Cleansing: No Wound Margin: Flat and Intact Exudate Amount: Large Exudate Type: Serous Exudate Color: amber Wound Bed Granulation Amount: Medium (34-66%) Exposed Structure Granulation Quality: Pink Fascia Exposed: No Necrotic Amount: Medium (34-66%) Fat Layer Exposed: No Necrotic Quality: Adherent Slough Tendon Exposed: No Muscle Exposed: No Joint Exposed: No Bone Exposed: No Limited to Skin Breakdown Periwound Skin Texture Texture Color No Abnormalities Noted: No No Abnormalities Noted: No Callus: No Atrophie Blanche: No Crepitus: No Cyanosis: No Excoriation: No Ecchymosis: No Fluctuance: No Erythema: No Friable: No Hemosiderin Staining: No Induration: No Mottled: No Localized Edema: Yes Pallor: No Rash: No Rubor: No Scarring: No Temperature / Pain Moisture Tenderness on  Palpation: Yes No Abnormalities Noted: No Dry / Scaly: No Maceration: Yes Moist: Yes Wound Preparation Ulcer Cleansing: Other: soap and water, Topical Anesthetic Applied: Other: lidocaine 4%, Treatment Notes Wound #3 (Right Calcaneus) 1. Cleansed with: Cleanse wound with antibacterial soap and water 4. Dressing Applied: Aquacel Ag 5. Secondary Dressing Applied ABD Pad 7. Secured with [name], [name] ([id number]) 3 Layer Compression System - Bilateral Notes xtrasorb, drawtex to heel Electronic Signature(s) Signed: 11/15/2016 5:32:07 PM By: Elpidio Eric [name], RN Entered By: Elpidio Eric on 11/15/2016 13:23:05 [name] ([id number]) -------------------------------------------------------------------------------- Wound Assessment Details Patient Name: [name] Date of Service: 11/15/2016 1:30 PM Medical Record Number: [id number] Patient Account Number: [id number] Date of Birth/Sex: 1946/07/10 (72 y.o. Male) Treating RN: [name], RN, [name], Rita Primary Care Physician: Oretha Milch Other Clinician: Referring Physician: Oretha Milch Treating Physician/Extender: Rudene Re in Treatment: 45 Wound Status Wound Number: 9 Primary Diabetic Wound/Ulcer of the Lower Etiology: Extremity Wound Location: Left Lower Leg - Circumfernential Secondary Venous Leg Ulcer Etiology: Wounding Event: Gradually Appeared Wound Open Date Acquired: 08/09/2016 Status: Weeks Of Treatment: 13 Comorbid Cataracts, Chronic Obstructive Clustered Wound: Yes History: Pulmonary Disease (COPD), Sleep Apnea, Angina, Congestive Heart Failure, Hypertension, Type II Diabetes, Gout, Osteoarthritis, Neuropathy Photos Photo Uploaded By: Elpidio Eric on 11/15/2016 17:30:58 Wound Measurements Length: (cm) 15 Width: (cm) 23 Depth: (cm) 0.1 Area: (cm) 270.962 Volume: (cm) 27.096 % Reduction in Area: -170.6% % Reduction in Volume: -170.6% Epithelialization: Medium (34-66%) Tunneling:  No Undermining: No Wound Description Classification: Grade 2 Wound Margin: Flat and Intact Exudate Amount: Large Exudate Type: Serous Exudate Color: amber Rasmussen, Khizar V. ([id number]) Foul Odor After Cleansing: No Wound Bed Granulation Amount: Large (67-100%) Exposed Structure Granulation Quality: Pink Fascia Exposed: No Necrotic Amount: None Present (0%) Fat Layer Exposed: No Tendon Exposed: No Muscle Exposed: No Joint Exposed: No Bone Exposed: No Limited to Skin Breakdown Periwound Skin Texture Texture Color No Abnormalities Noted: No No Abnormalities Noted: No Callus: No Atrophie Blanche: No Crepitus: No Cyanosis: No Excoriation: No Ecchymosis: No Fluctuance: No Erythema: No Friable: No Hemosiderin Staining: No Induration: No Mottled: Yes Localized Edema: Yes Pallor: No Rash: No Rubor: No Scarring: No Temperature / Pain Moisture Tenderness on Palpation: Yes No Abnormalities Noted: No Dry / Scaly: No Maceration: Yes Moist: No Wound Preparation Ulcer Cleansing: Other: soap and water, Topical Anesthetic Applied: None Treatment Notes Wound #9 (Left, Circumferential Lower Leg) 1. Cleansed with: Cleanse wound with antibacterial soap and water 4. Dressing Applied: Aquacel Ag 5. Secondary Dressing Applied ABD Pad 7. Secured with 3 Layer Compression System - Bilateral Notes xtrasorb, drawtex to heel Electronic Signature(s) JAVARIAN, JAKUBIAK ([id number]) Signed: 11/15/2016 5:32:07 PM By: Elpidio Eric [name], RN Entered By:  Elpidio Eric on 11/15/2016 13:23:38 JAMARKIS, BRANAM ([id number]) -------------------------------------------------------------------------------- Vitals Details Patient Name: [name] Date of Service: 11/15/2016 1:30 PM Medical Record Number: [id number] Patient Account Number: [id number] Date of Birth/Sex: 1946-11-03 (70 y.o. Male) Treating RN: [name], RN, [name], Rita Primary Care Physician: Oretha Milch Other Clinician: Referring  Physician: Oretha Milch Treating Physician/Extender: Rudene Re in Treatment: 45 Vital Signs Time Taken: 13:09 Temperature (F): 97.9 Height (in): 70 Pulse (bpm): 63 Weight (lbs): 235 Respiratory Rate (breaths/min): 18 Body Mass Index (BMI): 33.7 Blood Pressure (mmHg): 147/67 Reference Range: 80 - 120 mg / dl Electronic Signature(s) Signed: 11/15/2016 5:32:07 PM By: Elpidio Eric [name], RN Entered By: Elpidio Eric on 11/15/2016 13:09:19

## 2016-11-22 ENCOUNTER — Encounter: Payer: Medicare Other | Admitting: Surgery

## 2016-11-22 DIAGNOSIS — E11621 Type 2 diabetes mellitus with foot ulcer: Secondary | ICD-10-CM | POA: Diagnosis not present

## 2016-11-23 NOTE — Progress Notes (Signed)
KHYLIN, GUTRIDGE ([id number]) Visit Report for 11/22/2016 Arrival Information Details Patient Name: TIMOTY, BOURKE. Date of Service: 11/22/2016 12:30 PM Medical Record Number: [id number] Patient Account Number: [id number] Date of Birth/Sex: [birth year] (71 y.o. Male) Treating RN: Phillis Haggis Primary Care Physician: Oretha Milch Other Clinician: Referring Physician: Oretha Milch Treating Physician/Extender: Rudene Re in Treatment: 13 Visit Information History Since Last Visit All ordered tests and consults were completed: No Patient Arrived: Wheel Chair Added or deleted any medications: No Arrival Time: 12:47 Any new allergies or adverse reactions: No Accompanied By: self Had a fall or experienced change in No Transfer Assistance: EasyP[employer] activities of daily living that may affect Patient Lift risk of falls: Patient Identification Verified: Yes Signs or symptoms of abuse/neglect since last No Secondary Verification Process Yes visito Completed: Hospitalized since last visit: No Patient Requires Transmission- No Has Dressing in Place as Prescribed: Yes Based Precautions: Has Compression in Place as Prescribed: Yes Patient Has Alerts: Yes Pain Present Now: No Patient Alerts: DM II Electronic Signature(s) Signed: 11/22/2016 4:51:47 PM By: Alejandro Mulling Entered By: Alejandro Mulling on 11/22/2016 12:48:17 [name] ([id number]) -------------------------------------------------------------------------------- Encounter Discharge Information Details Patient Name: [name]. Date of Service: 11/22/2016 12:30 PM Medical Record Number: [id number] Patient Account Number: [id number] Date of Birth/Sex: 22-Sep-1946 (71 y.o. Male) Treating RN: Phillis Haggis Primary Care Physician: Oretha Milch Other Clinician: Referring Physician: Oretha Milch Treating Physician/Extender: Rudene Re in Treatment: 62 Encounter Discharge Information Items Discharge  Pain Level: 0 Discharge Condition: Stable Ambulatory Status: Wheelchair Discharge Destination: Nursing Home Transportation: Other Accompanied By: self Schedule Follow-up Appointment: Yes Medication Reconciliation completed and provided to Patient/Care Yes Luvern Mischke: Provided on Clinical Summary of Care: 11/22/2016 Form Type Recipient Paper Patient EL Electronic Signature(s) Signed: 11/22/2016 2:02:18 PM By: Gwenlyn Perking Entered By: Gwenlyn Perking on 11/22/2016 14:02:18 Detloff, Harjot Seth Bake ([id number]) -------------------------------------------------------------------------------- Lower Extremity Assessment Details Patient Name: [name]. Date of Service: 11/22/2016 12:30 PM Medical Record Number: [id number] Patient Account Number: [id number] Date of Birth/Sex: 10-13-1946 (71 y.o. Male) Treating RN: Phillis Haggis Primary Care Physician: Oretha Milch Other Clinician: Referring Physician: Oretha Milch Treating Physician/Extender: Rudene Re in Treatment: 46 Edema Assessment Assessed: [Left: No] [Right: No] E[Left: dema] [Right: :] Calf Left: Right: Point of Measurement: 34 cm From Medial Instep 41 cm 39.2 cm Ankle Left: Right: Point of Measurement: 12 cm From Medial Instep 27.2 cm 25.2 cm Vascular Assessment Pulses: Dorsalis Pedis Palpable: [Left:Yes] [Right:Yes] Posterior Tibial Extremity colors, hair growth, and conditions: Extremity Color: [Left:Red] [Right:Red] Temperature of Extremity: [Left:Warm] [Right:Warm] Capillary Refill: [Left:< 3 seconds] [Right:< 3 seconds] Toe Nail Assessment Left: Right: Thick: Yes Yes Discolored: Yes Yes Deformed: Yes Yes Improper Length and Hygiene: Yes Yes Electronic Signature(s) Signed: 11/22/2016 4:51:47 PM By: Alejandro Mulling Entered By: Alejandro Mulling on 11/22/2016 13:09:08 Storlie, Muaz Seth Bake ([id number]) -------------------------------------------------------------------------------- Multi Wound Chart  Details Patient Name: [name]. Date of Service: 11/22/2016 12:30 PM Medical Record Number: [id number] Patient Account Number: [id number] Date of Birth/Sex: [birth year] (71 y.o. Male) Treating RN: Phillis Haggis Primary Care Physician: Oretha Milch Other Clinician: Referring Physician: Oretha Milch Treating Physician/Extender: Rudene Re in Treatment: 88 Vital Signs Height(in): 70 Pulse(bpm): 66 Weight(lbs): 235 Blood Pressure 127/80 (mmHg): Body Mass Index(BMI): 34 Temperature(F): 97.5 Respiratory Rate 18 (breaths/min): Photos: [10:No Photos] [3:No Photos] [9:No Photos] Wound Location: [10:Right Lower Leg - Anterior Right Calcaneus] [9:Left Lower Leg - Circumfernential] Wounding Event: [10:Blister] [3:Pressure Injury] [9:Gradually Appeared] Primary Etiology: [10:Lymphedema] [3:Pressure Ulcer] [9:Diabetic Wound/Ulcer of  the Lower Extremity] Secondary Etiology: [10: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] [9:Venous Leg Ulcer] Comorbid History: [10:Cataracts, Chronic Obstructive Pulmonary Disease (COPD), Sleep Disease (COPD), Sleep Disease (COPD), Sleep Apnea, Angina, Congestive Heart Failure, Congestive Heart Failure, Congestive Heart Failure, Hypertension, Type II Diabetes,  Gout, Osteoarthritis, Neuropathy Osteoarthritis, Neuropathy Osteoarthritis, Neuropathy] [3:Cataracts, Chronic Obstructive Pulmonary Apnea, Angina, Hypertension, Type II Diabetes, Gout,] [9:Cataracts, Chronic Obstructive Pulmonary Apnea, Angina,  Hypertension, Type II Diabetes, Gout,] Date Acquired: [10:08/22/2016] [3:10/31/2015] [9:08/09/2016] Weeks of Treatment: [10:13] [3:46] [9:14] Wound Status: [10:Open] [3:Open] [9:Open] Clustered Wound: [10:No] [3:No] [9:Yes] Measurements L x W x D 15x10x0.1 [3:0.5x1.5x0.2] [9:19x43.5x0.1] (cm) Area (cm) : [10:117.81] [3:0.589] [9:649.132] Volume (cm) : [10:11.781] [3:0.118] [9:64.913] % Reduction in Area: [10:-9900.80%] [3:91.70%]  [9:-548.20%] % Reduction in Volume: -9883.90% [3:91.70%] [9:-548.20%] Classification: [10:Partial Thickness] [3:Category/Stage III] [9:Grade 2] HBO Classification: [10:Grade 1] [3:Grade 1] [9:N/A] Exudate Amount: [10:Large] [3:Large] [9:Large] Exudate Type: [10:Serosanguineous] [3:Serous] [9:Serosanguineous] Exudate Color: red, brown amber red, brown Wound Margin: Flat and Intact Flat and Intact Flat and Intact Granulation Amount: Large (67-100%) Medium (34-66%) Large (67-100%) Granulation Quality: Red Pink Pink Necrotic Amount: Small (1-33%) Medium (34-66%) Small (1-33%) Exposed Structures: Fascia: No Fascia: No Fascia: No Fat: No Fat: No Fat: No Tendon: No Tendon: No Tendon: No Muscle: No Muscle: No Muscle: No Joint: No Joint: No Joint: No Bone: No Bone: No Bone: No Limited to Skin Limited to Skin Limited to Skin Breakdown Breakdown Breakdown Epithelialization: Large (67-100%) None None Debridement: N/A Debridement ([id number] N/A 11047) Pre-procedure N/A 13:24 N/A Verification/Time Out Taken: Pain Control: N/A Lidocaine 4% Topical N/A Solution Tissue Debrided: N/A Fibrin/Slough, Exudates, N/A Subcutaneous Level: N/A Skin/Subcutaneous N/A Tissue Debridement Area (sq N/A 0.75 N/A cm): Instrument: N/A Curette N/A Bleeding: N/A Minimum N/A Hemostasis Achieved: N/A Pressure N/A Procedural Pain: N/A 0 N/A Post Procedural Pain: N/A 0 N/A Debridement Treatment N/A Procedure was tolerated N/A Response: well Post Debridement N/A 0.5x1.5x0.2 N/A Measurements L x W x D (cm) Post Debridement N/A 0.118 N/A Volume: (cm) Post Debridement N/A Category/Stage III N/A Stage: Periwound Skin Texture: Edema: Yes Edema: Yes Edema: Yes Excoriation: No Excoriation: No Excoriation: No Induration: No Induration: No Induration: No Callus: No Callus: No Callus: No Crepitus: No Crepitus: No Crepitus: No Fluctuance: No Fluctuance: No Fluctuance: No Friable: No Friable:  No Friable: No Rash: No Rash: No Rash: No Scarring: No Scarring: No Scarring: No Dosher, Maclean V. ([id number]) Periwound Skin Moist: Yes Maceration: Yes Maceration: Yes Moisture: Maceration: No Moist: Yes Moist: No Dry/Scaly: No Dry/Scaly: No Dry/Scaly: No Periwound Skin Color: Atrophie Blanche: No Atrophie Blanche: No Mottled: Yes Cyanosis: No Cyanosis: No Atrophie Blanche: No Ecchymosis: No Ecchymosis: No Cyanosis: No Erythema: No Erythema: No Ecchymosis: No Hemosiderin Staining: No Hemosiderin Staining: No Erythema: No Mottled: No Mottled: No Hemosiderin Staining: No Pallor: No Pallor: No Pallor: No Rubor: No Rubor: No Rubor: No Temperature: No Abnormality N/A N/A Tenderness on Yes Yes Yes Palpation: Wound Preparation: Ulcer Cleansing: Other: Ulcer Cleansing: Other: Ulcer Cleansing: Other: soap and water soap and water soap and water Topical Anesthetic Topical Anesthetic Topical Anesthetic Applied: None Applied: Other: lidocaine Applied: None 4% Procedures Performed: N/A Debridement N/A Treatment Notes Electronic Signature(s) Signed: 11/22/2016 1:35:14 PM By: Evlyn KannerBritto, Errol MD, FACS Entered By: Evlyn KannerBritto, Errol on 11/22/2016 13:35:14 Ginette OttoLINDLEY, Donavon V. ([id number]) -------------------------------------------------------------------------------- Multi-Disciplinary Care Plan Details Patient Name: Ginette OttoLINDLEY, Awab V. Date of Service: 11/22/2016 12:30 PM Medical Record Number: [id number] Patient Account Number: [id number] Date of Birth/Sex: 03-24-1946 32(70 y.o. Male) Treating RN: Ashok CordiaPinkerton, [place]  Primary Care Physician: Oretha Milch Other Clinician: Referring Physician: Oretha Milch Treating Physician/Extender: Rudene Re in Treatment: 21 Active Inactive Abuse / Safety / Falls / Self Care Management Nursing Diagnoses: Potential for falls Goals: Patient will remain injury free Date Initiated: 03/01/2016 Goal Status: Active Interventions: Assess  fall risk on admission and as needed Notes: Nutrition Nursing Diagnoses: Imbalanced nutrition Goals: Patient/caregiver agrees to and verbalizes understanding of need to use nutritional supplements and/or vitamins as prescribed Date Initiated: 03/01/2016 Goal Status: Active Interventions: Assess patient nutrition upon admission and as needed per policy Notes: Orientation to the Wound Care Program Nursing Diagnoses: Knowledge deficit related to the wound healing center program Goals: Patient/caregiver will verbalize understanding of the [facility] [address] ALTONIO, SCHWERTNER ([id number]) Date Initiated: 03/01/2016 Goal Status: Active Interventions: Provide education on orientation to the wound center Notes: Pain, Acute or Chronic Nursing Diagnoses: Pain, acute or chronic: actual or potential Potential alteration in comfort, pain Goals: Patient will verbalize adequate pain control and receive pain control interventions during procedures as needed Date Initiated: 03/01/2016 Goal Status: Active Patient/caregiver will verbalize adequate pain control between visits Date Initiated: 03/01/2016 Goal Status: Active Interventions: Assess comfort goal upon admission Complete pain assessment as per visit requirements Notes: Pressure Nursing Diagnoses: Knowledge deficit related to causes and risk factors for pressure ulcer development Knowledge deficit related to management of pressures ulcers Goals: Patient/caregiver will verbalize risk factors for pressure ulcer development Date Initiated: 03/01/2016 Goal Status: Active Interventions: Assess offloading mechanisms upon admission and as needed Notes: Wound/Skin Impairment Nursing Diagnoses: CONSTANCE, HACKENBERG ([id number]) Impaired tissue integrity Goals: Ulcer/skin breakdown will have a volume reduction of 30% by week 4 Date Initiated: 03/01/2016 Goal Status: Active Ulcer/skin breakdown will have a volume reduction of 50%  by week 8 Date Initiated: 03/01/2016 Goal Status: Active Ulcer/skin breakdown will have a volume reduction of 80% by week 12 Date Initiated: 03/01/2016 Goal Status: Active Interventions: Assess ulceration(s) every visit Notes: Electronic Signature(s) Signed: 11/22/2016 4:51:47 PM By: Alejandro Mulling Entered By: Alejandro Mulling on 11/22/2016 13:23:05 [name] ([id number]) -------------------------------------------------------------------------------- Pain Assessment Details Patient Name: [name]. Date of Service: 11/22/2016 12:30 PM Medical Record Number: [id number] Patient Account Number: [id number] Date of Birth/Sex: [birth year] (71 y.o. Male) Treating RN: Phillis Haggis Primary Care Physician: Oretha Milch Other Clinician: Referring Physician: Oretha Milch Treating Physician/Extender: Rudene Re in Treatment: 33 Active Problems Location of Pain Severity and Description of Pain Patient Has Paino No Site Locations With Dressing Change: No Pain Management and Medication Current Pain Management: Electronic Signature(s) Signed: 11/22/2016 4:51:47 PM By: Alejandro Mulling Entered By: Alejandro Mulling on 11/22/2016 12:48:24 [name] ([id number]) -------------------------------------------------------------------------------- Patient/Caregiver Education Details Patient Name: [name]. Date of Service: 11/22/2016 12:30 PM Medical Record Number: [id number] Patient Account Number: [id number] Date of Birth/Gender: 1946-09-06 (71 y.o. Male) Treating RN: Phillis Haggis Primary Care Physician: Oretha Milch Other Clinician: Referring Physician: Oretha Milch Treating Physician/Extender: Rudene Re in Treatment: 39 Education Assessment Education Provided To: Patient Education Topics Provided Wound/Skin Impairment: Handouts: Other: do not get wraps wet Methods: Demonstration, Explain/Verbal Responses: State content correctly Electronic  Signature(s) Signed: 11/22/2016 4:51:47 PM By: Alejandro Mulling Entered By: Alejandro Mulling on 11/22/2016 14:00:15 Criger, Ladarren Seth Bake ([id number]) -------------------------------------------------------------------------------- Wound Assessment Details Patient Name: [name]. Date of Service: 11/22/2016 12:30 PM Medical Record Number: [id number] Patient Account Number: [id number] Date of Birth/Sex: [birth year] (71 y.o. Male) Treating RN: Phillis Haggis Primary Care Physician: Oretha Milch Other Clinician: Referring Physician: Oretha Milch  Treating Physician/Extender: Rudene Re in Treatment: 46 Wound Status Wound Number: 10 Primary Lymphedema Etiology: Wound Location: Right Lower Leg - Anterior Secondary Diabetic Wound/Ulcer of the Lower Wounding Event: Blister Etiology: Extremity Date Acquired: 08/22/2016 Wound Open Weeks Of Treatment: 13 Status: Clustered Wound: No Comorbid Cataracts, Chronic Obstructive History: Pulmonary Disease (COPD), Sleep Apnea, Angina, Congestive Heart Failure, Hypertension, Type II Diabetes, Gout, Osteoarthritis, Neuropathy Photos Photo Uploaded By: Alejandro Mulling on 11/22/2016 16:30:05 Wound Measurements Length: (cm) 15 Width: (cm) 10 Depth: (cm) 0.1 Area: (cm) 117.81 Volume: (cm) 11.781 % Reduction in Area: -9900.8% % Reduction in Volume: -9883.9% Epithelialization: Large (67-100%) Tunneling: No Undermining: No Wound Description Classification: Partial Thickness Diabetic Severity (Wagner): Grade 1 Wound Margin: Flat and Intact Exudate Amount: Large Exudate Type: Serosanguineous Exudate Color: red, brown Sweda, Vikrant V. ([id number]) Wound Bed Granulation Amount: Large (67-100%) Exposed Structure Granulation Quality: Red Fascia Exposed: No Necrotic Amount: Small (1-33%) Fat Layer Exposed: No Necrotic Quality: Adherent Slough Tendon Exposed: No Muscle Exposed: No Joint Exposed: No Bone Exposed: No Limited to  Skin Breakdown Periwound Skin Texture Texture Color No Abnormalities Noted: No No Abnormalities Noted: No Callus: No Atrophie Blanche: No Crepitus: No Cyanosis: No Excoriation: No Ecchymosis: No Fluctuance: No Erythema: No Friable: No Hemosiderin Staining: No Induration: No Mottled: No Localized Edema: Yes Pallor: No Rash: No Rubor: No Scarring: No Temperature / Pain Moisture Temperature: No Abnormality No Abnormalities Noted: No Tenderness on Palpation: Yes Dry / Scaly: No Maceration: No Moist: Yes Wound Preparation Ulcer Cleansing: Other: soap and water, Topical Anesthetic Applied: None Treatment Notes Wound #10 (Right, Anterior Lower Leg) 1. Cleansed with: Clean wound with Normal Saline Cleanse wound with antibacterial soap and water 3. Peri-wound Care: Antifungal cream 4. Dressing Applied: Aquacel Ag 5. Secondary Dressing Applied ABD Pad 7. Secured with Tape 3 Layer Compression System - Bilateral Ellerbe, Robt V. ([id number]) Notes unna to anchor, [employer] forces operational [employer]) Signed: 11/22/2016 4:51:47 PM By: Alejandro Mulling Entered By: Alejandro Mulling on 11/22/2016 13:07:26 [name] ([id number]) -------------------------------------------------------------------------------- Wound Assessment Details Patient Name: [name]. Date of Service: 11/22/2016 12:30 PM Medical Record Number: [id number] Patient Account Number: [id number] Date of Birth/Sex: [birth year] (71 y.o. Male) Treating RN: Phillis Haggis Primary Care Physician: Oretha Milch Other Clinician: Referring Physician: Oretha Milch Treating Physician/Extender: Rudene Re in Treatment: 65 Wound Status Wound Number: 3 Primary Pressure Ulcer Etiology: Wound Location: Right Calcaneus Wound Open Wounding Event: Pressure Injury Status: Date Acquired: 10/31/2015 Comorbid Cataracts, Chronic Obstructive Weeks Of Treatment: 46 History: Pulmonary Disease (COPD),  Sleep Clustered Wound: No Apnea, Angina, Congestive Heart Failure, Hypertension, Type II Diabetes, Gout, Osteoarthritis, Neuropathy Photos Photo Uploaded By: Alejandro Mulling on 11/22/2016 16:30:06 Wound Measurements Length: (cm) 0.5 Width: (cm) 1.5 Depth: (cm) 0.2 Area: (cm) 0.589 Volume: (cm) 0.118 % Reduction in Area: 91.7% % Reduction in Volume: 91.7% Epithelialization: None Tunneling: No Undermining: No Wound Description Classification: Category/Stage III Foul Odor Diabetic Severity [address]): Grade 1 Wound Margin: Flat and Intact Exudate Amount: Large Exudate Type: Serous Exudate Color: amber After Cleansing: No Wound Bed Granulation Amount: Medium (34-66%) Exposed Structure Tan, Kerolos V. ([id number]) Granulation Quality: Pink Fascia Exposed: No Necrotic Amount: Medium (34-66%) Fat Layer Exposed: No Necrotic Quality: Adherent Slough Tendon Exposed: No Muscle Exposed: No Joint Exposed: No Bone Exposed: No Limited to Skin Breakdown Periwound Skin Texture Texture Color No Abnormalities Noted: No No Abnormalities Noted: No Callus: No Atrophie Blanche: No Crepitus: No Cyanosis: No Excoriation: No Ecchymosis: No Fluctuance: No Erythema: No Friable: No  Hemosiderin Staining: No Induration: No Mottled: No Localized Edema: Yes Pallor: No Rash: No Rubor: No Scarring: No Temperature / Pain Moisture Tenderness on Palpation: Yes No Abnormalities Noted: No Dry / Scaly: No Maceration: Yes Moist: Yes Wound Preparation Ulcer Cleansing: Other: soap and water, Topical Anesthetic Applied: Other: lidocaine 4%, Treatment Notes Wound #3 (Right Calcaneus) 1. Cleansed with: Clean wound with Normal Saline Cleanse wound with antibacterial soap and water 2. Anesthetic 2% Lidocaine injectible with epinephrine prior to debridement 3. Peri-wound Care: Antifungal cream 4. Dressing Applied: Other dressing (specify in notes) 5. Secondary Dressing Applied ABD  Pad Dry Gauze Notes drawtex to heel, endoform Drab, Job V. ([id number]) Electronic Signature(s) Signed: 11/22/2016 4:51:47 PM By: Alejandro Mulling Entered By: Alejandro Mulling on 11/22/2016 13:06:14 Doering, Kashden V. ([id number]) -------------------------------------------------------------------------------- Wound Assessment Details Patient Name: [name]. Date of Service: 11/22/2016 12:30 PM Medical Record Number: [id number] Patient Account Number: [id number] Date of Birth/Sex: Sep 18, 1946 (71 y.o. Male) Treating RN: Phillis Haggis Primary Care Physician: Oretha Milch Other Clinician: Referring Physician: Oretha Milch Treating Physician/Extender: Rudene Re in Treatment: 31 Wound Status Wound Number: 9 Primary Diabetic Wound/Ulcer of the Lower Etiology: Extremity Wound Location: Left Lower Leg - Circumfernential Secondary Venous Leg Ulcer Etiology: Wounding Event: Gradually Appeared Wound Open Date Acquired: 08/09/2016 Status: Weeks Of Treatment: 14 Comorbid Cataracts, Chronic Obstructive Clustered Wound: Yes History: Pulmonary Disease (COPD), Sleep Apnea, Angina, Congestive Heart Failure, Hypertension, Type II Diabetes, Gout, Osteoarthritis, Neuropathy Photos Photo Uploaded By: Alejandro Mulling on 11/22/2016 16:30:42 Wound Measurements Length: (cm) 19 Width: (cm) 43.5 Depth: (cm) 0.1 Area: (cm) 649.132 Volume: (cm) 64.913 % Reduction in Area: -548.2% % Reduction in Volume: -548.2% Epithelialization: None Tunneling: No Undermining: No Wound Description Classification: Grade 2 Wound Margin: Flat and Intact Exudate Amount: Large Exudate Type: Serosanguineous Exudate Color: red, brown Rubert, [place] V. ([id number]) Foul Odor After Cleansing: No Wound Bed Granulation Amount: Large (67-100%) Exposed Structure Granulation Quality: Pink Fascia Exposed: No Necrotic Amount: Small (1-33%) Fat Layer Exposed: No Necrotic Quality: Adherent  Slough Tendon Exposed: No Muscle Exposed: No Joint Exposed: No Bone Exposed: No Limited to Skin Breakdown Periwound Skin Texture Texture Color No Abnormalities Noted: No No Abnormalities Noted: No Callus: No Atrophie Blanche: No Crepitus: No Cyanosis: No Excoriation: No Ecchymosis: No Fluctuance: No Erythema: No Friable: No Hemosiderin Staining: No Induration: No Mottled: Yes Localized Edema: Yes Pallor: No Rash: No Rubor: No Scarring: No Temperature / Pain Moisture Tenderness on Palpation: Yes No Abnormalities Noted: No Dry / Scaly: No Maceration: Yes Moist: No Wound Preparation Ulcer Cleansing: Other: soap and water, Topical Anesthetic Applied: None Treatment Notes Wound #9 (Left, Circumferential Lower Leg) 1. Cleansed with: Clean wound with Normal Saline Cleanse wound with antibacterial soap and water 3. Peri-wound Care: Antifungal cream 4. Dressing Applied: Aquacel Ag 5. Secondary Dressing Applied ABD Pad 7. Secured with Tape 3 Layer Compression System - Bilateral Farone, David V. ([id number]) Notes unna to anchor, [employer] forces operational [employer]) Signed: 11/22/2016 4:51:47 PM By: Alejandro Mulling Entered By: Alejandro Mulling on 11/22/2016 13:08:33 [name] ([id number]) -------------------------------------------------------------------------------- Vitals Details Patient Name: [name]. Date of Service: 11/22/2016 12:30 PM Medical Record Number: [id number] Patient Account Number: [id number] Date of Birth/Sex: 1946-03-22 (71 y.o. Male) Treating RN: Phillis Haggis Primary Care Physician: Oretha Milch Other Clinician: Referring Physician: Oretha Milch Treating Physician/Extender: Rudene Re in Treatment: 74 Vital Signs Time Taken: 12:53 Temperature (F): 97.5 Height (in): 70 Pulse (bpm): 66 Weight (lbs): 235 Respiratory Rate (breaths/min): 18 Body Mass Index (  BMI): 33.7 Blood Pressure (mmHg): 127/80 Reference Range:  80 - 120 mg / dl Electronic Signature(s) Signed: 11/22/2016 4:51:47 PM By: Alejandro Mulling Entered By: Alejandro Mulling on 11/22/2016 12:54:37

## 2016-11-23 NOTE — Progress Notes (Signed)
ZYHEIR, DAFT ([id number]) Visit Report for 11/22/2016 Chief Complaint Document Details Patient Name: [name], [name] Date of Service: 11/22/2016 12:30 PM Medical Record Number: [id number] Patient Account Number: [id number] Date of Birth/Sex: 1946-10-20 (71 y.o. Male) Treating RN: Phillis Haggis Primary Care Physician: Oretha Milch Other Clinician: Referring Physician: Oretha Milch Treating Physician/Extender: Rudene Re in Treatment: 18 Information Obtained from: Patient Chief Complaint [name] presents for follow-up on his right heel pressure ulcer and bilateral lower extremity lymphedema and venous ulcerations. Electronic Signature(s) Signed: 11/22/2016 1:35:30 PM By: Evlyn Kanner MD, FACS Entered By: Evlyn Kanner on 11/22/2016 13:35:30 [name] ([id number]) -------------------------------------------------------------------------------- Debridement Details Patient Name: [name] Date of Service: 11/22/2016 12:30 PM Medical Record Number: [id number] Patient Account Number: [id number] Date of Birth/Sex: 12-10-1973 (71 y.o. Male) Treating RN: Phillis Haggis Primary Care Physician: Oretha Milch Other Clinician: Referring Physician: Oretha Milch Treating Physician/Extender: Rudene Re in Treatment: 46 Debridement Performed for Wound #3 Right Calcaneus Assessment: Performed By: Physician Evlyn Kanner, MD Debridement: Debridement Pre-procedure Yes - 13:24 Verification/Time Out Taken: Start Time: 13:25 Pain Control: Lidocaine 4% Topical Solution Level: Skin/Subcutaneous Tissue Total Area Debrided (L x 0.5 (cm) x 1.5 (cm) = 0.75 (cm) W): Tissue and other Viable, Non-Viable, Exudate, Fibrin/Slough, Subcutaneous material debrided: Instrument: Curette Bleeding: Minimum Hemostasis Achieved: Pressure End Time: 13:27 Procedural Pain: 0 Post Procedural Pain: 0 Response to Treatment: Procedure was tolerated well Post Debridement  Measurements of Total Wound Length: (cm) 0.5 Stage: Category/Stage III Width: (cm) 1.5 Depth: (cm) 0.2 Volume: (cm) 0.118 Character of Wound/Ulcer Post Requires Further Debridement: Debridement Severity of Tissue Post Fat layer exposed Debridement: Post Procedure Diagnosis Same as Pre-procedure Electronic Signature(s) Signed: 11/22/2016 1:35:23 PM By: Evlyn Kanner MD, FACS Signed: 11/22/2016 4:51:47 PM By: Alejandro Mulling Entered By: Evlyn Kanner on 11/22/2016 13:35:23 [name], [name] ([id number]) [name], [name] ([id number]) -------------------------------------------------------------------------------- HPI Details Patient Name: [name] Date of Service: 11/22/2016 12:30 PM Medical Record Number: [id number] Patient Account Number: [id number] Date of Birth/Sex: December 19, 1945 (71 y.o. Male) Treating RN: Phillis Haggis Primary Care Physician: Oretha Milch Other Clinician: Referring Physician: Oretha Milch Treating Physician/Extender: Rudene Re in Treatment: 18 History of Present Illness Location: ulcerated area on the right heel, left gluteal region and thigh and then bilateral lower extremities Quality: Patient reports experiencing a sharp pain to affected area(s). Severity: Patient states wound are getting worse. Duration: Patient has had the wound for > 12 months prior to seeking treatment at the wound center Timing: Pain in wound is constant (hurts all the time) Context: The wound appeared gradually over time Modifying Factors: Other treatment(s) tried include: he sees his heart doctor and his primary care doctor Associated Signs and Symptoms: patient has not been able to walk for over a year now HPI Description: 71 year old gentleman with a known history of hypertension, diabetes, obstructive sleep apnea, COPD, diastolic CHF, coronary artery disease was admitted to the hospital with sepsis from an ulcer of the right heel and was treated there in October  2016. he also has chronic bilateral lower extremity edema and lymphedema. He had received vancomycin, Zosyn and at that stage and x-rays showed hardware in the right ankle but no evidence of osteomyelitis. he was a former smoker. he was also treated with Augmentin orally for 10 days. He is either bedbound or wheelchair-bound and does not ambulate by himself. 01/19/2016 -- he has not been seen here for 3 weeks and this was because he was admitted to [facility] between 223 and 01/08/2016 for sepsis, UTI and pneumonia involving the left lung. he was treated with IV vancomycin and Zosyn and then meropenem. Was discharged home on oral Bactrim for 2 weeks none of his vascular test or x-rays were done and we will reorder these. 01/26/2016 -- he has not yet done the x-ray of his foot and is vascular tests are still pending. I have asked him to work on these with his nursing home staff. Addendum: he has got an x-ray of the right foot done which shows that his osteopenia but no specific ostial lysis or abnormal periosteal reaction. Final impression was degenerative changes with dorsal foot soft tissue swelling. 02/16/2016 -- lower extremity arterial duplex examination shows a 50-99% stenosis of the right tibioperoneal trunk. He had biphasic flow in the right SFA, popliteal and tibioperoneal trunk. Left-sided he had triphasic flow throughout 02/29/2016 -- he is awaiting his vascular opinion with Dr. Gilda Crease which is to be done on April 24 03/08/2016 -- he has not kept his appointment with Dr. Gilda Crease on April 24 and does not seem to know what happened about this. it's difficult to gauge whether he is in full control of his mental faculties as at times he is extremely rude to the nursing staff. 03/22/2016 -- the patient was seen by Dr. 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 Recommended that the patient had severe atherosclerotic changes of both lower extremities associated with ulceration and tissue loss of the foot. This is a limb threatening ischemia and the patient was recommended to undergo Henandez, Clay [name]. ([id number]) angiography of the lower extremity with a hope for intervention for limb salvage. Patient agreed and will proceed to angiography. He was admitted to the hospital between May 2 and 03/15/2016 - he underwent induction of a catheter into his right lower extremity and third order catheter placement with contrast injection to the right lower extremity for distal runoff. Percutaneous transluminal angioplasty of the right superficial femoral and popliteal arteries were done. He also had a right peroneal angioplasty. Patient had a postoperative hematoma and was admitted for observation and in the next 24 hours he was very agitated and combative and had to be seen by psychiatric. He had a follow-up with Dr. Gilda Crease in 3 weeks. 04/18/2016 -- notes reviewed from the vascular office where he was seen for his postop visit after the PTA of the right SFA and popliteal arteries on 03/12/2016. He underwent an ABI which showed right ABI to be more than 1.3 and left to be more than 1 more than 1.3, great toe and PPG waveforms are decreased bilaterally. No additional intervention indicated at this time and the patient was to follow-up in 3 months with an ABI and bilateral lower extremity duplex study. 05/02/2016 -- he complains that his compression stockings are causing him a lot of discomfort and he is not happy wearing them. 05/30/2016 -- the swelling of both legs has increased again and he has had blisters which have opened out into ulcerations. 09/12/2016 -- the lymphedema pumps are still not delivered and hopefully will be done within this week. As far as his skin substitute goes we are still awaiting a response from his insurance 10-10-2016: Mr.  Szatkowski presents today and states that his left edema pumps have arrived but the he has only received treatment "5 times" since arrival. He also denies using the offloading  boot for the right heel but admits to having the boot. He states that staff is hesitant to assist and application of the lymphedema pumps. 10/17/2016 -- I understand he continues to take his diuretics and has been using his lymphedema pump once a day for about an hour. He cannot do it twice a day. 10-31-16 Mr. Emanuelson presents today for follow-up on his bilateral lower extremity venous ulcers and his right posterior heel pressure ulcer. He states that he continues to do his lymphedema pumps once daily, unable to get assistance for twice daily. he does admit to increased drainage to the left leg more than the right leg. He denies any pain, fever, chills or overall ill feeling. 11/08/2016 -- the skin substitute which was run through his insurance company has a high copayment and he is not agreeable about getting this. 11/22/2016 -- he is still not able to use his lymphedema pumps as requested due to lack of help at his living facility. Electronic Signature(s) Signed: 11/22/2016 1:36:00 PM By: Evlyn Kanner MD, FACS Entered By: Evlyn Kanner on 11/22/2016 13:36:00 [name] ([id number]) -------------------------------------------------------------------------------- Physical Exam Details Patient Name: [name] Date of Service: 11/22/2016 12:30 PM Medical Record Number: [id number] Patient Account Number: [id number] Date of Birth/Sex: 02/08/1946 (71 y.o. Male) Treating RN: Phillis Haggis Primary Care Physician: Oretha Milch Other Clinician: Referring Physician: Oretha Milch Treating Physician/Extender: Rudene Re in Treatment: 46 Constitutional . Pulse regular. Respirations normal and unlabored. Afebrile. . Eyes Nonicteric. Reactive to light. Ears, Nose, Mouth, and Throat Lips, teeth, and gums  WNL.Marland Kitchen Moist mucosa without lesions. Neck supple and nontender. No palpable supraclavicular or cervical adenopathy. Normal sized without goiter. Respiratory WNL. No retractions.. Breath sounds WNL, No rubs, rales, rhonchi, or wheeze.. Cardiovascular Heart rhythm and rate regular, no murmur or gallop.. Pedal Pulses WNL. No clubbing, cyanosis or edema. Chest Breasts symmetical and no nipple discharge.. Breast tissue WNL, no masses, lumps, or tenderness.. Lymphatic No adneopathy. No adenopathy. No adenopathy. Musculoskeletal Adexa without tenderness or enlargement.. Digits and nails w/o clubbing, cyanosis, infection, petechiae, ischemia, or inflammatory conditions.. Integumentary (Hair, Skin) No suspicious lesions. No crepitus or fluctuance. No peri-wound warmth or erythema. No masses.Marland Kitchen Psychiatric Judgement and insight Intact.. No evidence of depression, anxiety, or agitation.. Notes lymphedema both lower extremities has increased again this week and he has quite a bit of weeping from his micro-ulcerations posteriorly. The wound on the right calcaneum is much better today and after sharply debriding it I believe he will benefit from application of Endoform. Electronic Signature(s) Signed: 11/22/2016 1:37:01 PM By: Evlyn Kanner MD, FACS Entered By: Evlyn Kanner on 11/22/2016 13:37:01 [name] ([id number]) -------------------------------------------------------------------------------- Physician Orders Details Patient Name: [name] Date of Service: 11/22/2016 12:30 PM Medical Record Number: [id number] Patient Account Number: [id number] Date of Birth/Sex: 11-13-1945 (71 y.o. Male) Treating RN: Phillis Haggis Primary Care Physician: Oretha Milch Other Clinician: Referring Physician: Oretha Milch Treating Physician/Extender: Rudene Re in Treatment: 27 Verbal / Phone Orders: Yes Clinician: Ashok Cordia, Debi Read Back and Verified: Yes Diagnosis Coding Wound  Cleansing Wound #10 Right,Anterior Lower Leg o Clean wound with Normal Saline. - at clinic o Cleanse wound with mild soap and water - at clinic o No tub bath. - only sink bath or bed bath Wound #3 Right Calcaneus o Clean wound with Normal Saline. - at clinic o Cleanse wound with mild soap and water - at clinic o No tub bath. - only sink bath or bed bath Wound #9 Left,Circumferential  Lower Leg o Clean wound with Normal Saline. - at clinic o Cleanse wound with mild soap and water - at clinic o No tub bath. - only sink bath or bed bath Anesthetic Wound #10 Right,Anterior Lower Leg o Topical Lidocaine 4% cream applied to wound bed prior to debridement - for clinic use only Wound #3 Right Calcaneus o Topical Lidocaine 4% cream applied to wound bed prior to debridement - for clinic use only Wound #9 Left,Circumferential Lower Leg o Topical Lidocaine 4% cream applied to wound bed prior to debridement - for clinic use only Skin Barriers/Peri-Wound Care Wound #10 Right,Anterior Lower Leg o Antifungal cream - Nystatin o Triamcinolone Acetonide Ointment Wound #3 Right Calcaneus o Antifungal cream - Nystatin o Triamcinolone Acetonide Ointment Wound #9 Left,Circumferential Lower Leg o Antifungal cream - Nystatin Finken, Tuff [name]. ([id number]) o Triamcinolone Acetonide Ointment Primary Wound Dressing Wound #10 Right,Anterior Lower Leg o Aquacel Ag Wound #3 Right Calcaneus o Other: - endoform Wound #9 Left,Circumferential Lower Leg o Aquacel Ag Secondary Dressing Wound #10 Right,Anterior Lower Leg o ABD pad o XtraSorb Wound #3 Right Calcaneus o ABD pad o Dry Gauze o Drawtex Wound #9 Left,Circumferential Lower Leg o ABD pad o XtraSorb Dressing Change Frequency Wound #10 Right,Anterior Lower Leg o Change dressing every week o Other: - SNF to change bilateral leg wrap if the drainage becomes visible but to do take off the  heel dressing. Wound #3 Right Calcaneus o Change dressing every week - SNF to change bilateral leg wrap if the drainage becomes visible but to do take off the heel dressing. Wound #9 Left,Circumferential Lower Leg o Change dressing every week Follow-up Appointments Wound #10 Right,Anterior Lower Leg o Return Appointment in 1 week. Wound #3 Right Calcaneus o Return Appointment in 1 week. Wound #9 Left,Circumferential Lower Leg o Return Appointment in 1 week. Kandis MannanLINDLEY, Jeremias [name]. ([id number]) Edema Control Wound #10 Right,Anterior Lower Leg o 3 Layer Compression System - Bilateral o Compression Pump: Use compression pump on left lower extremity for 30 minutes, twice daily. o Compression Pump: Use compression pump on right lower extremity for 30 minutes, twice daily. o Other: - unna to anchor Wound #3 Right Calcaneus o 3 Layer Compression System - Bilateral o Compression Pump: Use compression pump on left lower extremity for 30 minutes, twice daily. o Compression Pump: Use compression pump on right lower extremity for 30 minutes, twice daily. o Other: - unna to anchor Wound #9 Left,Circumferential Lower Leg o 3 Layer Compression System - Bilateral o Compression Pump: Use compression pump on left lower extremity for 30 minutes, twice daily. o Compression Pump: Use compression pump on right lower extremity for 30 minutes, twice daily. o Other: - unna to anchor Off-Loading Wound #10 Right,Anterior Lower Leg o Turn and reposition every 2 hours o Other: - sage boots at night and float heel when lying in bed *******lymphedema pumps to be placed on pt twice a day for one hour sessions******** Wound #3 Right Calcaneus o Turn and reposition every 2 hours o Other: - sage boots at night and float heel when lying in bed *******lymphedema pumps to be placed on pt twice a day for one hour sessions******** Wound #9 Left,Circumferential Lower  Leg o Turn and reposition every 2 hours o Other: - sage boots at night and float heel when lying in bed *******lymphedema pumps to be placed on pt twice a day for one hour sessions******** Additional Orders / Instructions Wound #10 Right,Anterior Lower Leg o Increase protein intake.  Laden, Ercil [name]. ([id number]) Wound #3 Right Calcaneus o Increase protein intake. Wound #9 Left,Circumferential Lower Leg o Increase protein intake. Medications-please add to medication list. Wound #10 Right,Anterior Lower Leg o Other: - Vitamin C, Vitamin A, Zinc, multivitamin Wound #3 Right Calcaneus o Other: - Vitamin C, Vitamin A, Zinc, multivitamin Wound #9 Left,Circumferential Lower Leg o Other: - Vitamin C, Vitamin A, Zinc, multivitamin Electronic Signature(s) Signed: 11/22/2016 4:00:20 PM By: Evlyn Kanner MD, FACS Signed: 11/22/2016 4:51:47 PM By: Alejandro Mulling Entered By: Alejandro Mulling on 11/22/2016 13:54:54 [name], [name] ([id number]) -------------------------------------------------------------------------------- Problem List Details Patient Name: [name] Date of Service: 11/22/2016 12:30 PM Medical Record Number: [id number] Patient Account Number: [id number] Date of Birth/Sex: [birth year] (71 y.o. Male) Treating RN: Phillis Haggis Primary Care Physician: Oretha Milch Other Clinician: Referring Physician: Oretha Milch Treating Physician/Extender: Rudene Re in Treatment: 66 Active Problems ICD-10 Encounter Code Description Active Date Diagnosis E11.621 Type 2 diabetes mellitus with foot ulcer 01/01/2016 Yes L89.613 Pressure ulcer of right heel, stage 3 01/01/2016 Yes I89.0 Lymphedema, not elsewhere classified 01/01/2016 Yes E66.01 Morbid (severe) obesity due to excess calories 01/01/2016 Yes M70.871 Other soft tissue disorders related to use, overuse and 01/19/2016 Yes pressure, right ankle and foot I70.234 Atherosclerosis of native arteries of  right leg with 02/16/2016 Yes ulceration of heel and midfoot Inactive Problems Resolved Problems ICD-10 Code Description Active Date Resolved Date L89.322 Pressure ulcer of left buttock, stage 2 01/01/2016 01/01/2016 Electronic Signature(s) Signed: 11/22/2016 1:35:09 PM By: Evlyn Kanner MD, FACS Entered By: Evlyn Kanner on 11/22/2016 13:35:09 [name] ([id number]) [name], Lannie VMarland Kitchen ([id number]) -------------------------------------------------------------------------------- Progress Note Details Patient Name: [name] Date of Service: 11/22/2016 12:30 PM Medical Record Number: [id number] Patient Account Number: [id number] Date of Birth/Sex: 14-Oct-1946 (71 y.o. Male) Treating RN: Phillis Haggis Primary Care Physician: Oretha Milch Other Clinician: Referring Physician: Oretha Milch Treating Physician/Extender: Rudene Re in Treatment: 60 Subjective Chief Complaint Information obtained from Patient Mr. Illes presents for follow-up on his right heel pressure ulcer and bilateral lower extremity lymphedema and venous ulcerations. History of Present Illness (HPI) The following HPI elements were documented for the patient's wound: Location: ulcerated area on the right heel, left gluteal region and thigh and then bilateral lower extremities Quality: Patient reports experiencing a sharp pain to affected area(s). Severity: Patient states wound are getting worse. Duration: Patient has had the wound for > 12 months prior to seeking treatment at the wound center Timing: Pain in wound is constant (hurts all the time) Context: The wound appeared gradually over time Modifying Factors: Other treatment(s) tried include: he sees his heart doctor and his primary care doctor Associated Signs and Symptoms: patient has not been able to walk for over a year now 71 year old gentleman with a known history of hypertension, diabetes, obstructive sleep apnea, COPD, diastolic CHF,  coronary artery disease was admitted to the hospital with sepsis from an ulcer of the right heel and was treated there in October 2016. he also has chronic bilateral lower extremity edema and lymphedema. He had received vancomycin, Zosyn and at that stage and x-rays showed hardware in the right ankle but no evidence of osteomyelitis. he was a former smoker. he was also treated with Augmentin orally for 10 days. He is either bedbound or wheelchair-bound and does not ambulate by himself. 01/19/2016 -- he has not been seen here for 3 weeks and this was because he was admitted to [facility] between 223 and 01/08/2016 for sepsis, UTI  and pneumonia involving the left lung. he was treated with IV vancomycin and Zosyn and then meropenem. Was discharged home on oral Bactrim for 2 weeks none of his vascular test or x-rays were done and we will reorder these. 01/26/2016 -- he has not yet done the x-ray of his foot and is vascular tests are still pending. I have asked him to work on these with his nursing home staff. Addendum: he has got an x-ray of the right foot done which shows that his osteopenia but no specific ostial lysis or abnormal periosteal reaction. Final impression was degenerative changes with dorsal foot soft tissue swelling. 02/16/2016 -- lower extremity arterial duplex examination shows a 50-99% stenosis of the right tibioperoneal trunk. He had biphasic flow in the right SFA, popliteal and tibioperoneal trunk. Left-sided he had triphasic flow throughout [name], [name] ([id number]) 02/29/2016 -- he is awaiting his vascular opinion with Dr. Gilda Crease which is to be done on April 24 03/08/2016 -- he has not kept his appointment with Dr. Gilda Crease on April 24 and does not seem to know what happened about this. it's difficult to gauge whether he is in full control of his mental faculties as at times he is extremely rude to the nursing staff. 03/22/2016 -- the patient was  seen by Dr. 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 Recommended that the patient had severe atherosclerotic changes of both lower extremities associated with ulceration and tissue loss of the foot. This is a limb threatening ischemia and the patient was recommended to undergo angiography of the lower extremity with a hope for intervention for limb salvage. Patient agreed and will proceed to angiography. He was admitted to the hospital between May 2 and 03/15/2016 - he underwent induction of a catheter into his right lower extremity and third order catheter placement with contrast injection to the right lower extremity for distal runoff. Percutaneous transluminal angioplasty of the right superficial femoral and popliteal arteries were done. He also had a right peroneal angioplasty. Patient had a postoperative hematoma and was admitted for observation and in the next 24 hours he was very agitated and combative and had to be seen by psychiatric. He had a follow-up with Dr. Gilda Crease in 3 weeks. 04/18/2016 -- notes reviewed from the vascular office where he was seen for his postop visit after the PTA of the right SFA and popliteal arteries on 03/12/2016. He underwent an ABI which showed right ABI to be more than 1.3 and left to be more than 1 more than 1.3, great toe and PPG waveforms are decreased bilaterally. No additional intervention indicated at this time and the patient was to follow-up in 3 months with an ABI and bilateral lower extremity duplex study. 05/02/2016 -- he complains that his compression stockings are causing him a lot of discomfort and he is not happy wearing them. 05/30/2016 -- the swelling of both legs has increased again and he has had blisters which have opened out into ulcerations. 09/12/2016 -- the lymphedema pumps are still not delivered and hopefully will be  done within this week. As far as his skin substitute goes we are still awaiting a response from his insurance 10-10-2016: Mr. Vital presents today and states that his left edema pumps have arrived but the he has only received treatment "5 times" since arrival. He also denies using the offloading boot for the right heel but admits to  having the boot. He states that staff is hesitant to assist and application of the lymphedema pumps. 10/17/2016 -- I understand he continues to take his diuretics and has been using his lymphedema pump once a day for about an hour. He cannot do it twice a day. 10-31-16 Mr. Vickrey presents today for follow-up on his bilateral lower extremity venous ulcers and his right posterior heel pressure ulcer. He states that he continues to do his lymphedema pumps once daily, unable to get assistance for twice daily. he does admit to increased drainage to the left leg more than the right leg. He denies any pain, fever, chills or overall ill feeling. 11/08/2016 -- the skin substitute which was run through his insurance company has a high copayment and he is not agreeable about getting this. 11/22/2016 -- he is still not able to use his lymphedema pumps as requested due to lack of help at his living facility. [name], [name] ([id number]) Objective Constitutional Pulse regular. Respirations normal and unlabored. Afebrile. Vitals Time Taken: 12:53 PM, Height: 70 in, Weight: 235 lbs, BMI: 33.7, Temperature: 97.5 F, Pulse: 66 bpm, Respiratory Rate: 18 breaths/min, Blood Pressure: 127/80 mmHg. Eyes Nonicteric. Reactive to light. Ears, Nose, Mouth, and Throat Lips, teeth, and gums WNL.Marland Kitchen Moist mucosa without lesions. Neck supple and nontender. No palpable supraclavicular or cervical adenopathy. Normal sized without goiter. Respiratory WNL. No retractions.. Breath sounds WNL, No rubs, rales, rhonchi, or wheeze.. Cardiovascular Heart rhythm and rate regular, no murmur or  gallop.. Pedal Pulses WNL. No clubbing, cyanosis or edema. Chest Breasts symmetical and no nipple discharge.. Breast tissue WNL, no masses, lumps, or tenderness.. Lymphatic No adneopathy. No adenopathy. No adenopathy. Musculoskeletal Adexa without tenderness or enlargement.. Digits and nails w/o clubbing, cyanosis, infection, petechiae, ischemia, or inflammatory conditions.Marland Kitchen Psychiatric Judgement and insight Intact.. No evidence of depression, anxiety, or agitation.. General Notes: lymphedema both lower extremities has increased again this week and he has quite a bit of weeping from his micro-ulcerations posteriorly. The wound on the right calcaneum is much better today and after sharply debriding it I believe he will benefit from application of Endoform. Integumentary (Hair, Skin) No suspicious lesions. No crepitus or fluctuance. No peri-wound warmth or erythema. No masses.. Wound #10 status is Open. Original cause of wound was Blister. The wound is located on the Right,Anterior Lower Leg. The wound measures 15cm length x 10cm width x 0.1cm depth; 117.81cm^2 area and [name], [name] ([id number]) 11.781cm^3 volume. The wound is limited to skin breakdown. There is no tunneling or undermining noted. There is a large amount of serosanguineous drainage noted. The wound margin is flat and intact. There is large (67-100%) red granulation within the wound bed. There is a small (1-33%) amount of necrotic tissue within the wound bed including Adherent Slough. The periwound skin appearance exhibited: Localized Edema, Moist. The periwound skin appearance did not exhibit: Callus, Crepitus, Excoriation, Fluctuance, Friable, Induration, Rash, Scarring, Dry/Scaly, Maceration, Atrophie Blanche, Cyanosis, Ecchymosis, Hemosiderin Staining, Mottled, Pallor, Rubor, Erythema. Periwound temperature was noted as No Abnormality. The periwound has tenderness on palpation. Wound #3 status is Open. Original cause of  wound was Pressure Injury. The wound is located on the Right Calcaneus. The wound measures 0.5cm length x 1.5cm width x 0.2cm depth; 0.589cm^2 area and 0.118cm^3 volume. The wound is limited to skin breakdown. There is no tunneling or undermining noted. There is a large amount of serous drainage noted. The wound margin is flat and intact. There is medium (34-66%) pink granulation within the  wound bed. There is a medium (34-66%) amount of necrotic tissue within the wound bed including Adherent Slough. The periwound skin appearance exhibited: Localized Edema, Maceration, Moist. The periwound skin appearance did not exhibit: Callus, Crepitus, Excoriation, Fluctuance, Friable, Induration, Rash, Scarring, Dry/Scaly, Atrophie Blanche, Cyanosis, Ecchymosis, Hemosiderin Staining, Mottled, Pallor, Rubor, Erythema. The periwound has tenderness on palpation. Wound #9 status is Open. Original cause of wound was Gradually Appeared. The wound is located on the Left,Circumferential Lower Leg. The wound measures 19cm length x 43.5cm width x 0.1cm depth; 649.132cm^2 area and 64.913cm^3 volume. The wound is limited to skin breakdown. There is no tunneling or undermining noted. There is a large amount of serosanguineous drainage noted. The wound margin is flat and intact. There is large (67-100%) pink granulation within the wound bed. There is a small (1-33%) amount of necrotic tissue within the wound bed including Adherent Slough. The periwound skin appearance exhibited: Localized Edema, Maceration, Mottled. The periwound skin appearance did not exhibit: Callus, Crepitus, Excoriation, Fluctuance, Friable, Induration, Rash, Scarring, Dry/Scaly, Moist, Atrophie Blanche, Cyanosis, Ecchymosis, Hemosiderin Staining, Pallor, Rubor, Erythema. The periwound has tenderness on palpation. 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 ([id number]) Procedures Wound #3 Wound #3 is a Pressure Ulcer located on the Right Calcaneus . There was a Skin/Subcutaneous Tissue Debridement ([id number]) debridement with total area of 0.75 sq cm performed by Evlyn Kanner, MD. with the following instrument(s): Curette to remove Viable and Non-Viable tissue/material including Exudate, Fibrin/Slough, and Subcutaneous after achieving pain control using Lidocaine 4% Topical Solution. A time out was conducted at 13:24, prior to the start of the procedure. A Minimum amount of bleeding was controlled with Pressure. The procedure was tolerated well with a pain level of 0 throughout and a pain level of 0 following the procedure. Post Debridement Measurements: 0.5cm length x 1.5cm width x 0.2cm depth; 0.118cm^3 volume. Post debridement Stage noted as Category/Stage III. Character of Wound/Ulcer Post Debridement requires further debridement. Severity of Tissue Post Debridement is: Fat layer exposed. Post procedure Diagnosis Wound #3: Same as Pre-Procedure Plan Wound Cleansing: Wound #10 Right,Anterior Lower Leg: Clean wound with Normal Saline. - at clinic Cleanse wound with mild soap and water - at clinic No tub bath. - only sink bath or bed bath Wound #3 Right Calcaneus: Clean wound with Normal Saline. - at clinic Cleanse wound with mild soap and water - at clinic No tub bath. - only sink bath or bed bath Wound #9 Left,Circumferential Lower Leg: Clean wound with Normal Saline. - at clinic Cleanse wound with mild soap and water - at clinic No tub bath. - only sink bath or bed bath Anesthetic: Wound #10 Right,Anterior Lower  Leg: Topical Lidocaine 4% cream applied to wound bed prior to debridement - for clinic use only Wound #3 Right Calcaneus: Topical Lidocaine 4% cream applied to wound bed prior to debridement - for clinic use only Wound #9 Left,Circumferential Lower Leg: Topical Lidocaine 4% cream applied to wound bed prior to debridement - for clinic use only Skin Barriers/Peri-Wound Care: Wound #10 Right,Anterior Lower Leg: Antifungal cream - Nystatin Triamcinolone Acetonide Ointment Wound #3 Right Calcaneus: Antifungal  cream - Nystatin Triamcinolone Acetonide Ointment [name], [name] ([id number]) Wound #9 Left,Circumferential Lower Leg: Antifungal cream - Nystatin Triamcinolone Acetonide Ointment Primary Wound Dressing: Wound #10 Right,Anterior Lower Leg: Aquacel Ag Wound #3 Right Calcaneus: Other: - endoform Wound #9 Left,Circumferential Lower Leg: Aquacel Ag Secondary Dressing: Wound #10 Right,Anterior Lower Leg: ABD pad XtraSorb Wound #3 Right Calcaneus: ABD pad Dry Gauze Drawtex Wound #9 Left,Circumferential Lower Leg: ABD pad XtraSorb Dressing Change Frequency: Wound #10 Right,Anterior Lower Leg: Change dressing every week Other: - SNF to change bilateral leg wrap if the drainage becomes visible but to do take off the heel dressing. Wound #3 Right Calcaneus: Change dressing every week - SNF to change bilateral leg wrap if the drainage becomes visible but to do take off the heel dressing. Wound #9 Left,Circumferential Lower Leg: Change dressing every week Follow-up Appointments: Wound #10 Right,Anterior Lower Leg: Return Appointment in 1 week. Wound #3 Right Calcaneus: Return Appointment in 1 week. Wound #9 Left,Circumferential Lower Leg: Return Appointment in 1 week. Edema Control: Wound #10 Right,Anterior Lower Leg: 3 Layer Compression System - Bilateral Compression Pump: Use compression pump on left lower extremity for 30 minutes, twice daily. Compression Pump: Use  compression pump on right lower extremity for 30 minutes, twice daily. Other: - unna to anchor Wound #3 Right Calcaneus: 3 Layer Compression System - Bilateral Compression Pump: Use compression pump on left lower extremity for 30 minutes, twice daily. Compression Pump: Use compression pump on right lower extremity for 30 minutes, twice daily. Other: - unna to anchor Wound #9 Left,Circumferential Lower Leg: 3 Layer Compression System - Bilateral [name], [name] ([id number]) Compression Pump: Use compression pump on left lower extremity for 30 minutes, twice daily. Compression Pump: Use compression pump on right lower extremity for 30 minutes, twice daily. Other: - unna to anchor Off-Loading: Wound #10 Right,Anterior Lower Leg: Turn and reposition every 2 hours Other: - sage boots at night and float heel when lying in bed *******lymphedema pumps to be placed on pt twice a day for one hour sessions******** Wound #3 Right Calcaneus: Turn and reposition every 2 hours Other: - sage boots at night and float heel when lying in bed *******lymphedema pumps to be placed on pt twice a day for one hour sessions******** Wound #9 Left,Circumferential Lower Leg: Turn and reposition every 2 hours Other: - sage boots at night and float heel when lying in bed *******lymphedema pumps to be placed on pt twice a day for one hour sessions******** Additional Orders / Instructions: Wound #10 Right,Anterior Lower Leg: Increase protein intake. Wound #3 Right Calcaneus: Increase protein intake. Wound #9 Left,Circumferential Lower Leg: Increase protein intake. Medications-please add to medication list.: Wound #10 Right,Anterior Lower Leg: Other: - Vitamin C, Vitamin A, Zinc, multivitamin Wound #3 Right Calcaneus: Other: - Vitamin C, Vitamin A, Zinc, multivitamin Wound #9 Left,Circumferential Lower Leg: Other: - Vitamin C, Vitamin A, Zinc, multivitamin I have recommended : 1. using Endoform and Drawtex  for his right heel to be changed every week 2. he has received his lymphedema pumps which hopefully will make a difference to his overall general wound healing. he is finding it difficult to get someone to help him put these on and off at his nursing home and we will attempt to talk to the nursing supervisor there. at present he is using them once a day only. 3. We will continue with silver alginate and a 3 layer Profore. Electronic Signature(s) Signed: 11/22/2016 4:02:05 PM By: Evlyn Kanner MD, FACS Previous Signature:  11/22/2016 1:38:33 PM Version By: Evlyn Kanner MD, FACS Entered By: Evlyn Kanner on 11/22/2016 16:02:05 [name] ([id number]) [name], Tyrone Seth Bake ([id number]) -------------------------------------------------------------------------------- SuperBill Details Patient Name: [name] Date of Service: 11/22/2016 Medical Record Number: [id number] Patient Account Number: [id number] Date of Birth/Sex: [birth year] (71 y.o. 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 Facility Procedures CPT4 Code: [id number] Description: 11042 - DEB SUBQ TISSUE 20 SQ CM/< ICD-10 Description Diagnosis E11.621 Type 2 diabetes mellitus with foot ulcer L89.613 Pressure ulcer of right heel, stage 3 I89.0 Lymphedema, not elsewhere classified Modifier: Quantity: 1 Physician Procedures CPT4 Code: [id number] Description: 11042 - WC  PHYS SUBQ TISS 20 SQ CM ICD-10 Description Diagnosis E11.621 Type 2 diabetes mellitus with foot ulcer L89.613 Pressure ulcer of right heel, stage 3 I89.0 Lymphedema, not elsewhere classified Modifier: Quantity: 1 Electronic Signature(s) Signed: 11/22/2016 4:02:14 PM By: Evlyn Kanner MD, FACS Previous Signature: 11/22/2016 1:38:47 PM Version By: Evlyn Kanner MD, FACS Entered By: Evlyn Kanner on 11/22/2016 16:02:12

## 2016-11-29 ENCOUNTER — Ambulatory Visit: Payer: Medicare Other | Admitting: Surgery

## 2016-12-02 ENCOUNTER — Encounter: Payer: Medicare Other | Admitting: Surgery

## 2016-12-02 DIAGNOSIS — E11621 Type 2 diabetes mellitus with foot ulcer: Secondary | ICD-10-CM | POA: Diagnosis not present

## 2016-12-03 NOTE — Progress Notes (Signed)
[name] ([id number]) Visit Report for 12/02/2016 Chief Complaint Document Details Patient Name: [name], [name] Date of Service: 12/02/2016 1:30 PM Medical Record Number: [id number] Patient Account Number: [id number] Date of Birth/Sex: [birth year] (71 y.o. Male) Treating RN: Phillis Haggis Primary Care Provider: Oretha Milch Other Clinician: Referring Provider: Oretha Milch Treating Provider/Extender: Rudene Re in Treatment: 60 Information Obtained from: Patient Chief Complaint [name] presents for follow-up on his right heel pressure ulcer and bilateral lower extremity lymphedema and venous ulcerations. Electronic Signature(s) Signed: 12/02/2016 1:50:34 PM By: Evlyn Kanner MD, FACS Entered By: Evlyn Kanner on 12/02/2016 13:50:33 [name] ([id number]) -------------------------------------------------------------------------------- Debridement Details Patient Name: [name] Date of Service: 12/02/2016 1:30 PM Medical Record Number: [id number] Patient Account Number: [id number] Date of Birth/Sex: [birth year] (71 y.o. Male) Treating RN: Phillis Haggis Primary Care Provider: Oretha Milch Other Clinician: Referring Provider: Oretha Milch Treating Provider/Extender: Rudene Re in Treatment: 48 Debridement Performed for Wound #3 Right Calcaneus Assessment: Performed By: Physician Evlyn Kanner, MD Debridement: Debridement Pre-procedure Yes - 13:41 Verification/Time Out Taken: Start Time: 13:42 Pain Control: Lidocaine 4% Topical Solution Level: Skin/Subcutaneous Tissue Total Area Debrided (L x 0.3 (cm) x 1 (cm) = 0.3 (cm) W): Tissue and other Viable, Non-Viable, Exudate, Fibrin/Slough, Subcutaneous material debrided: Instrument: Curette Bleeding: Minimum Hemostasis Achieved: Pressure End Time: 13:45 Procedural Pain: 0 Post Procedural Pain: 0 Response to Treatment: Procedure was tolerated well Post Debridement Measurements of  Total Wound Length: (cm) 0.4 Stage: Category/Stage III Width: (cm) 1 Depth: (cm) 0.2 Volume: (cm) 0.063 Character of Wound/Ulcer Post Requires Further Debridement: Debridement Severity of Tissue Post Fat layer exposed Debridement: Post Procedure Diagnosis Same as Pre-procedure Electronic Signature(s) Signed: 12/02/2016 1:50:26 PM By: Evlyn Kanner MD, FACS Signed: 12/02/2016 4:38:27 PM By: Alejandro Mulling Entered By: Evlyn Kanner on 12/02/2016 13:50:26 Six, [name]. ([id number]) [name], [name] ([id number]) -------------------------------------------------------------------------------- HPI Details Patient Name: [name] Date of Service: 12/02/2016 1:30 PM Medical Record Number: [id number] Patient Account Number: [id number] Date of Birth/Sex: 1946/04/06 (71 y.o. Male) Treating RN: Phillis Haggis Primary Care Provider: Oretha Milch Other Clinician: Referring Provider: Oretha Milch Treating Provider/Extender: Rudene Re in Treatment: 48 History of Present Illness Location: ulcerated area on the right heel, left gluteal region and thigh and then bilateral lower extremities Quality: Patient reports experiencing a sharp pain to affected area(s). Severity: Patient states wound are getting worse. Duration: Patient has had the wound for > 12 months prior to seeking treatment at the wound center Timing: Pain in wound is constant (hurts all the time) Context: The wound appeared gradually over time Modifying Factors: Other treatment(s) tried include: he sees his heart doctor and his primary care doctor Associated Signs and Symptoms: patient has not been able to walk for over a year now HPI Description: 71 year old gentleman with a known history of hypertension, diabetes, obstructive sleep apnea, COPD, diastolic CHF, coronary artery disease was admitted to the hospital with sepsis from an ulcer of the right heel and was treated there in October 2016. he also has  chronic bilateral lower extremity edema and lymphedema. He had received vancomycin, Zosyn and at that stage and x-rays showed hardware in the right ankle but no evidence of osteomyelitis. he was a former smoker. he was also treated with Augmentin orally for 10 days. He is either bedbound or wheelchair-bound and does not ambulate by himself. 01/19/2016 -- he has not been seen here for 3 weeks and this was because he was admitted to [facility] between 223 and 01/08/2016 for sepsis, UTI and pneumonia involving the left lung. he was treated with IV vancomycin and Zosyn and then meropenem. Was discharged home on oral Bactrim for 2 weeks none of his vascular test or x-rays were done and we will reorder these. 01/26/2016 -- he has not yet done the x-ray of his foot and is vascular tests are still pending. I have asked him to work on these with his nursing home staff. Addendum: he has got an x-ray of the right foot done which shows that his osteopenia but no specific ostial lysis or abnormal periosteal reaction. Final impression was degenerative changes with dorsal foot soft tissue swelling. 02/16/2016 -- lower extremity arterial duplex examination shows a 50-99% stenosis of the right tibioperoneal trunk. He had biphasic flow in the right SFA, popliteal and tibioperoneal trunk. Left-sided he had triphasic flow throughout 02/29/2016 -- he is awaiting his vascular opinion with Dr. Gilda Crease which is to be done on April 24 03/08/2016 -- he has not kept his appointment with Dr. Gilda Crease on April 24 and does not seem to know what happened about this. it's difficult to gauge whether he is in full control of his mental faculties as at times he is extremely rude to the nursing staff. 03/22/2016 -- the patient was seen by Dr. 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  Recommended that the patient had severe atherosclerotic changes of both lower extremities associated with ulceration and tissue loss of the foot. This is a limb threatening ischemia and the patient was recommended to undergo Wanke, Arlan [name]. ([id number]) angiography of the lower extremity with a hope for intervention for limb salvage. Patient agreed and will proceed to angiography. He was admitted to the hospital between May 2 and 03/15/2016 - he underwent induction of a catheter into his right lower extremity and third order catheter placement with contrast injection to the right lower extremity for distal runoff. Percutaneous transluminal angioplasty of the right superficial femoral and popliteal arteries were done. He also had a right peroneal angioplasty. Patient had a postoperative hematoma and was admitted for observation and in the next 24 hours he was very agitated and combative and had to be seen by psychiatric. He had a follow-up with Dr. Gilda Crease in 3 weeks. 04/18/2016 -- notes reviewed from the vascular office where he was seen for his postop visit after the PTA of the right SFA and popliteal arteries on 03/12/2016. He underwent an ABI which showed right ABI to be more than 1.3 and left to be more than 1 more than 1.3, great toe and PPG waveforms are decreased bilaterally. No additional intervention indicated at this time and the patient was to follow-up in 3 months with an ABI and bilateral lower extremity duplex study. 05/02/2016 -- he complains that his compression stockings are causing him a lot of discomfort and he is not happy wearing them. 05/30/2016 -- the swelling of both legs has increased again and he has had blisters which have opened out into ulcerations. 09/12/2016 -- the lymphedema pumps are still not delivered and hopefully will be done within this week. As far as his skin substitute goes we are still awaiting a response from his insurance 10-10-2016: Mr. Colomb  presents today and states that his left edema pumps have arrived but the he has only received treatment "5 times" since arrival. He also denies using the offloading  boot for the right heel but admits to having the boot. He states that staff is hesitant to assist and application of the lymphedema pumps. 10/17/2016 -- I understand he continues to take his diuretics and has been using his lymphedema pump once a day for about an hour. He cannot do it twice a day. 10-31-16 [name] presents today for follow-up on his bilateral lower extremity venous ulcers and his right posterior heel pressure ulcer. He states that he continues to do his lymphedema pumps once daily, unable to get assistance for twice daily. he does admit to increased drainage to the left leg more than the right leg. He denies any pain, fever, chills or overall ill feeling. 11/08/2016 -- the skin substitute which was run through his insurance company has a high copayment and he is not agreeable about getting this. 11/22/2016 -- he is still not able to use his lymphedema pumps as requested due to lack of help at his living facility. Electronic Signature(s) Signed: 12/02/2016 1:50:43 PM By: Evlyn KannerBritto, Chrystian Ressler MD, FACS Entered By: Evlyn KannerBritto, Nadeem Romanoski on 12/02/2016 13:50:42 [name], [name] ([id number]) -------------------------------------------------------------------------------- Physical Exam Details Patient Name: [name], [name] Date of Service: 12/02/2016 1:30 PM Medical Record Number: [id number] Patient Account Number: [id number] Date of Birth/Sex: 10-25-1946 26(70 y.o. Male) Treating RN: Phillis HaggisPinkerton, Debi Primary Care Provider: Oretha MilchSMITH, [name] Other Clinician: Referring Provider: Oretha MilchSMITH, [name] Treating Provider/Extender: Rudene ReBritto, Shoua Ulloa Weeks in Treatment: 48 Constitutional . Pulse regular. Respirations normal and unlabored. Afebrile. . Eyes Nonicteric. Reactive to light. Ears, Nose, Mouth, and Throat Lips, teeth, and gums WNL.Marland Kitchen. Moist  mucosa without lesions. Neck supple and nontender. No palpable supraclavicular or cervical adenopathy. Normal sized without goiter. Respiratory WNL. No retractions.. Breath sounds WNL, No rubs, rales, rhonchi, or wheeze.. Cardiovascular Heart rhythm and rate regular, no murmur or gallop.. Pedal Pulses WNL. No clubbing, cyanosis or edema. Chest Breasts symmetical and no nipple discharge.. Breast tissue WNL, no masses, lumps, or tenderness.. Lymphatic No adneopathy. No adenopathy. No adenopathy. Musculoskeletal Adexa without tenderness or enlargement.. Digits and nails w/o clubbing, cyanosis, infection, petechiae, ischemia, or inflammatory conditions.. Integumentary (Hair, Skin) No suspicious lesions. No crepitus or fluctuance. No peri-wound warmth or erythema. No masses.Marland Kitchen. Psychiatric Judgement and insight Intact.. No evidence of depression, anxiety, or agitation.. Notes the lymphedema and the blisters on both lower extremities continue to be a problem but overall there has been some improvement. The right calcaneal wound is looking much better and after sharply debriding it the size has gone down significantly Electronic Signature(s) Signed: 12/02/2016 1:51:19 PM By: Evlyn KannerBritto, Shirel Mallis MD, FACS Entered By: Evlyn KannerBritto, Lenin Kuhnle on 12/02/2016 13:51:19 [name], [name] ([id number]) -------------------------------------------------------------------------------- Physician Orders Details Patient Name: [name], [name] Date of Service: 12/02/2016 1:30 PM Medical Record Number: [id number] Patient Account Number: [id number] Date of Birth/Sex: 10-25-1946 [age] y.o. Male) Treating RN: Phillis HaggisPinkerton, Debi Primary Care Provider: Oretha MilchSMITH, [name] Other Clinician: Referring Provider: Oretha MilchSMITH, [name] Treating Provider/Extender: Rudene ReBritto, Shanda Cadotte Weeks in Treatment: 4148 Verbal / Phone Orders: Yes Clinician: Ashok CordiaPinkerton, Debi Read Back and Verified: Yes Diagnosis Coding Wound Cleansing Wound #10 Right,Anterior Lower Leg o  Clean wound with Normal Saline. - at clinic o Cleanse wound with mild soap and water - at clinic o No tub bath. - only sink bath or bed bath Wound #3 Right Calcaneus o Clean wound with Normal Saline. - at clinic o Cleanse wound with mild soap and water - at clinic o No tub bath. - only sink bath or bed bath Wound #9 Left,Circumferential Lower Leg o Clean wound  with Normal Saline. - at clinic o Cleanse wound with mild soap and water - at clinic o No tub bath. - only sink bath or bed bath Anesthetic Wound #10 Right,Anterior Lower Leg o Topical Lidocaine 4% cream applied to wound bed prior to debridement - for clinic use only Wound #3 Right Calcaneus o Topical Lidocaine 4% cream applied to wound bed prior to debridement - for clinic use only Wound #9 Left,Circumferential Lower Leg o Topical Lidocaine 4% cream applied to wound bed prior to debridement - for clinic use only Skin Barriers/Peri-Wound Care Wound #10 Right,Anterior Lower Leg o Antifungal cream - Nystatin o Triamcinolone Acetonide Ointment Wound #3 Right Calcaneus o Antifungal cream - Nystatin o Triamcinolone Acetonide Ointment Wound #9 Left,Circumferential Lower Leg o Antifungal cream - Nystatin [name], [name] ([id number]) o Triamcinolone Acetonide Ointment Primary Wound Dressing Wound #10 Right,Anterior Lower Leg o Aquacel Ag Wound #3 Right Calcaneus o Other: - endoform Wound #9 Left,Circumferential Lower Leg o Aquacel Ag Secondary Dressing Wound #10 Right,Anterior Lower Leg o ABD pad o XtraSorb Wound #3 Right Calcaneus o ABD pad o Dry Gauze o Drawtex Wound #9 Left,Circumferential Lower Leg o ABD pad o XtraSorb Dressing Change Frequency Wound #10 Right,Anterior Lower Leg o Change dressing every week o Other: - SNF to change bilateral leg wrap if the drainage becomes visible but to do take off the heel dressing. Wound #3 Right Calcaneus o Change  dressing every week - SNF to change bilateral leg wrap if the drainage becomes visible but to do take off the heel dressing. Wound #9 Left,Circumferential Lower Leg o Change dressing every week Follow-up Appointments Wound #10 Right,Anterior Lower Leg o Return Appointment in 1 week. Wound #3 Right Calcaneus o Return Appointment in 1 week. Wound #9 Left,Circumferential Lower Leg o Return Appointment in 1 week. ASHDON, GILLSON [name]. ([id number]) Edema Control Wound #10 Right,Anterior Lower Leg o 3 Layer Compression System - Bilateral o Compression Pump: Use compression pump on left lower extremity for 30 minutes, twice daily. o Compression Pump: Use compression pump on right lower extremity for 30 minutes, twice daily. o Other: - unna to anchor Wound #3 Right Calcaneus o 3 Layer Compression System - Bilateral o Compression Pump: Use compression pump on left lower extremity for 30 minutes, twice daily. o Compression Pump: Use compression pump on right lower extremity for 30 minutes, twice daily. o Other: - unna to anchor Wound #9 Left,Circumferential Lower Leg o 3 Layer Compression System - Bilateral o Compression Pump: Use compression pump on left lower extremity for 30 minutes, twice daily. o Compression Pump: Use compression pump on right lower extremity for 30 minutes, twice daily. o Other: - unna to anchor Off-Loading Wound #10 Right,Anterior Lower Leg o Turn and reposition every 2 hours o Other: - sage boots at night and float heel when lying in bed *******lymphedema pumps to be placed on pt twice a day for one hour sessions******** Wound #3 Right Calcaneus o Turn and reposition every 2 hours o Other: - sage boots at night and float heel when lying in bed *******lymphedema pumps to be placed on pt twice a day for one hour sessions******** Wound #9 Left,Circumferential Lower Leg o Turn and reposition every 2 hours o Other: - sage  boots at night and float heel when lying in bed *******lymphedema pumps to be placed on pt twice a day for one hour sessions******** Additional Orders / Instructions Wound #10 Right,Anterior Lower Leg o Increase protein intake. MARRIS, FRONTERA VMarland Kitchen ([id number]) Wound #  3 Right Calcaneus o Increase protein intake. Wound #9 Left,Circumferential Lower Leg o Increase protein intake. Medications-please add to medication list. Wound #10 Right,Anterior Lower Leg o Other: - Vitamin C, Vitamin A, Zinc, multivitamin Wound #3 Right Calcaneus o Other: - Vitamin C, Vitamin A, Zinc, multivitamin Wound #9 Left,Circumferential Lower Leg o Other: - Vitamin C, Vitamin A, Zinc, multivitamin Electronic Signature(s) Signed: 12/02/2016 4:24:55 PM By: Evlyn Kanner MD, FACS Signed: 12/02/2016 4:38:27 PM By: Alejandro Mulling Entered By: Alejandro Mulling on 12/02/2016 13:44:53 Ruppert, Nikalas [name]. ([id number]) -------------------------------------------------------------------------------- Problem List Details Patient Name: [name] Date of Service: 12/02/2016 1:30 PM Medical Record Number: [id number] Patient Account Number: [id number] Date of Birth/Sex: 13-Aug-1946 (71 y.o. Male) Treating RN: Phillis Haggis Primary Care Provider: Oretha Milch Other Clinician: Referring Provider: Oretha Milch Treating Provider/Extender: Rudene Re in Treatment: 62 Active Problems ICD-10 Encounter Code Description Active Date Diagnosis E11.621 Type 2 diabetes mellitus with foot ulcer 01/01/2016 Yes L89.613 Pressure ulcer of right heel, stage 3 01/01/2016 Yes I89.0 Lymphedema, not elsewhere classified 01/01/2016 Yes E66.01 Morbid (severe) obesity due to excess calories 01/01/2016 Yes M70.871 Other soft tissue disorders related to use, overuse and 01/19/2016 Yes pressure, right ankle and foot 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/16/2016 Yes ulceration of heel and midfoot Inactive  Problems Resolved Problems ICD-10 Code Description Active Date Resolved Date L89.322 Pressure ulcer of left buttock, stage 2 01/01/2016 01/01/2016 Electronic Signature(s) Signed: 12/02/2016 1:49:54 PM By: Evlyn Kanner MD, FACS Entered By: Evlyn Kanner on 12/02/2016 13:49:54 [name], [name] ([id number]) [name], [name] VMarland Kitchen ([id number]) -------------------------------------------------------------------------------- Progress Note Details Patient Name: [name] Date of Service: 12/02/2016 1:30 PM Medical Record Number: [id number] Patient Account Number: [id number] Date of Birth/Sex: 1946/06/22 (71 y.o. Male) Treating RN: Phillis Haggis Primary Care Provider: Oretha Milch Other Clinician: Referring Provider: Oretha Milch Treating Provider/Extender: Rudene Re in Treatment: 70 Subjective Chief Complaint Information obtained from Patient Mr. Bargar presents for follow-up on his right heel pressure ulcer and bilateral lower extremity lymphedema and venous ulcerations. History of Present Illness (HPI) The following HPI elements were documented for the patient's wound: Location: ulcerated area on the right heel, left gluteal region and thigh and then bilateral lower extremities Quality: Patient reports experiencing a sharp pain to affected area(s). Severity: Patient states wound are getting worse. Duration: Patient has had the wound for > 12 months prior to seeking treatment at the wound center Timing: Pain in wound is constant (hurts all the time) Context: The wound appeared gradually over time Modifying Factors: Other treatment(s) tried include: he sees his heart doctor and his primary care doctor Associated Signs and Symptoms: patient has not been able to walk for over a year now 71 year old gentleman with a known history of hypertension, diabetes, obstructive sleep apnea, COPD, diastolic CHF, coronary artery disease was admitted to the hospital with sepsis from an ulcer  of the right heel and was treated there in October 2016. he also has chronic bilateral lower extremity edema and lymphedema. He had received vancomycin, Zosyn and at that stage and x-rays showed hardware in the right ankle but no evidence of osteomyelitis. he was a former smoker. he was also treated with Augmentin orally for 10 days. He is either bedbound or wheelchair-bound and does not ambulate by himself. 01/19/2016 -- he has not been seen here for 3 weeks and this was because he was admitted to [facility] between 223 and 01/08/2016 for sepsis, UTI and pneumonia involving the left  lung. he was treated with IV vancomycin and Zosyn and then meropenem. Was discharged home on oral Bactrim for 2 weeks none of his vascular test or x-rays were done and we will reorder these. 01/26/2016 -- he has not yet done the x-ray of his foot and is vascular tests are still pending. I have asked him to work on these with his nursing home staff. Addendum: he has got an x-ray of the right foot done which shows that his osteopenia but no specific ostial lysis or abnormal periosteal reaction. Final impression was degenerative changes with dorsal foot soft tissue swelling. 02/16/2016 -- lower extremity arterial duplex examination shows a 50-99% stenosis of the right tibioperoneal trunk. He had biphasic flow in the right SFA, popliteal and tibioperoneal trunk. Left-sided he had triphasic flow throughout NIKOLA, MARONE ([id number]) 02/29/2016 -- he is awaiting his vascular opinion with Dr. Gilda Crease which is to be done on April 24 03/08/2016 -- he has not kept his appointment with Dr. Gilda Crease on April 24 and does not seem to know what happened about this. it's difficult to gauge whether he is in full control of his mental faculties as at times he is extremely rude to the nursing staff. 03/22/2016 -- the patient was seen by Dr. 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 Recommended that the patient had severe atherosclerotic changes of both lower extremities associated with ulceration and tissue loss of the foot. This is a limb threatening ischemia and the patient was recommended to undergo angiography of the lower extremity with a hope for intervention for limb salvage. Patient agreed and will proceed to angiography. He was admitted to the hospital between May 2 and 03/15/2016 - he underwent induction of a catheter into his right lower extremity and third order catheter placement with contrast injection to the right lower extremity for distal runoff. Percutaneous transluminal angioplasty of the right superficial femoral and popliteal arteries were done. He also had a right peroneal angioplasty. Patient had a postoperative hematoma and was admitted for observation and in the next 24 hours he was very agitated and combative and had to be seen by psychiatric. He had a follow-up with Dr. Gilda Crease in 3 weeks. 04/18/2016 -- notes reviewed from the vascular office where he was seen for his postop visit after the PTA of the right SFA and popliteal arteries on 03/12/2016. He underwent an ABI which showed right ABI to be more than 1.3 and left to be more than 1 more than 1.3, great toe and PPG waveforms are decreased bilaterally. No additional intervention indicated at this time and the patient was to follow-up in 3 months with an ABI and bilateral lower extremity duplex study. 05/02/2016 -- he complains that his compression stockings are causing him a lot of discomfort and he is not happy wearing them. 05/30/2016 -- the swelling of both legs has increased again and he has had blisters which have opened out into ulcerations. 09/12/2016 -- the lymphedema pumps are still not delivered and hopefully will be done within this week. As far as his skin substitute goes we are still  awaiting a response from his insurance 10-10-2016: Mr. Brabson presents today and states that his left edema pumps have arrived but the he has only received treatment "5 times" since arrival. He also denies using the offloading boot for the right heel but admits to having the boot. He states  that staff is hesitant to assist and application of the lymphedema pumps. 10/17/2016 -- I understand he continues to take his diuretics and has been using his lymphedema pump once a day for about an hour. He cannot do it twice a day. 10-31-16 Mr. Romas presents today for follow-up on his bilateral lower extremity venous ulcers and his right posterior heel pressure ulcer. He states that he continues to do his lymphedema pumps once daily, unable to get assistance for twice daily. he does admit to increased drainage to the left leg more than the right leg. He denies any pain, fever, chills or overall ill feeling. 11/08/2016 -- the skin substitute which was run through his insurance company has a high copayment and he is not agreeable about getting this. 11/22/2016 -- he is still not able to use his lymphedema pumps as requested due to lack of help at his living facility. Selinger, Haniel [name]. ([id number]) Objective Constitutional Pulse regular. Respirations normal and unlabored. Afebrile. Vitals Time Taken: 1:28 PM, Height: 70 in, Weight: 235 lbs, BMI: 33.7, Temperature: 97.7 F, Pulse: 72 bpm, Respiratory Rate: 18 breaths/min, Blood Pressure: 124/58 mmHg. Eyes Nonicteric. Reactive to light. Ears, Nose, Mouth, and Throat Lips, teeth, and gums WNL.Marland Kitchen Moist mucosa without lesions. Neck supple and nontender. No palpable supraclavicular or cervical adenopathy. Normal sized without goiter. Respiratory WNL. No retractions.. Breath sounds WNL, No rubs, rales, rhonchi, or wheeze.. Cardiovascular Heart rhythm and rate regular, no murmur or gallop.. Pedal Pulses WNL. No clubbing, cyanosis or edema. Chest Breasts  symmetical and no nipple discharge.. Breast tissue WNL, no masses, lumps, or tenderness.. Lymphatic No adneopathy. No adenopathy. No adenopathy. Musculoskeletal Adexa without tenderness or enlargement.. Digits and nails w/o clubbing, cyanosis, infection, petechiae, ischemia, or inflammatory conditions.Marland Kitchen Psychiatric Judgement and insight Intact.. No evidence of depression, anxiety, or agitation.. General Notes: the lymphedema and the blisters on both lower extremities continue to be a problem but overall there has been some improvement. The right calcaneal wound is looking much better and after sharply debriding it the size has gone down significantly Integumentary (Hair, Skin) No suspicious lesions. No crepitus or fluctuance. No peri-wound warmth or erythema. No masses.. Wound #10 status is Open. Original cause of wound was Blister. The wound is located on the Right,Anterior Lower Leg. The wound measures 15cm length x 10cm width x 0.1cm depth; 117.81cm^2 area and Ramakrishnan, Daniyal [name]. ([id number]) 11.781cm^3 volume. The wound is limited to skin breakdown. There is no tunneling or undermining noted. There is a large amount of serosanguineous drainage noted. The wound margin is flat and intact. There is large (67-100%) red granulation within the wound bed. There is a small (1-33%) amount of necrotic tissue within the wound bed including Adherent Slough. The periwound skin appearance did not exhibit: Callus, Crepitus, Excoriation, Induration, Rash, Scarring, Dry/Scaly, Maceration, Atrophie Blanche, Cyanosis, Ecchymosis, Hemosiderin Staining, Mottled, Pallor, Rubor, Erythema. Periwound temperature was noted as No Abnormality. The periwound has tenderness on palpation. Wound #3 status is Open. Original cause of wound was Pressure Injury. The wound is located on the Right Calcaneus. The wound measures 0.3cm length x 1cm width x 0.2cm depth; 0.236cm^2 area and 0.047cm^3 volume. The wound is limited to  skin breakdown. There is no tunneling or undermining noted. There is a large amount of serous drainage noted. The wound margin is flat and intact. There is medium (34-66%) pink granulation within the wound bed. There is a medium (34-66%) amount of necrotic tissue within the wound bed including Adherent Slough. The periwound  skin appearance exhibited: Maceration. The periwound skin appearance did not exhibit: Callus, Crepitus, Excoriation, Induration, Rash, Scarring, Dry/Scaly, Atrophie Blanche, Cyanosis, Ecchymosis, Hemosiderin Staining, Mottled, Pallor, Rubor, Erythema. The periwound has tenderness on palpation. Wound #9 status is Open. Original cause of wound was Gradually Appeared. The wound is located on the Left,Circumferential Lower Leg. The wound measures 19cm length x 43.5cm width x 0.1cm depth; 649.132cm^2 area and 64.913cm^3 volume. The wound is limited to skin breakdown. There is no tunneling or undermining noted. There is a large amount of serosanguineous drainage noted. The wound margin is flat and intact. There is medium (34-66%) pink granulation within the wound bed. There is a medium (34- 66%) amount of necrotic tissue within the wound bed including Adherent Slough. The periwound skin appearance exhibited: Maceration, Mottled. The periwound skin appearance did not exhibit: Callus, Crepitus, Excoriation, Induration, Rash, Scarring, Dry/Scaly, Atrophie Blanche, Cyanosis, Ecchymosis, Hemosiderin Staining, Pallor, Rubor, Erythema. The periwound has tenderness on palpation. 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 ([id number]) Wound #3 Wound #3 is a Pressure Ulcer located on the Right Calcaneus . There was a Skin/Subcutaneous Tissue Debridement ([id number]) debridement with total area of 0.3 sq cm performed by Evlyn Kanner, MD. with the following instrument(s): Curette to remove Viable and Non-Viable tissue/material including Exudate, Fibrin/Slough, and Subcutaneous after achieving pain control using Lidocaine 4% Topical Solution. A time out was conducted at 13:41, prior to the start of the procedure. A Minimum amount of bleeding was controlled with Pressure. The procedure was tolerated well with a pain level of 0 throughout and a pain level of 0 following the procedure. Post Debridement Measurements: 0.4cm length x 1cm width x 0.2cm depth; 0.063cm^3 volume. Post debridement Stage noted as Category/Stage III. Character of Wound/Ulcer Post Debridement requires further debridement. Severity of Tissue Post Debridement is: Fat layer exposed. Post procedure Diagnosis Wound #3: Same as Pre-Procedure Plan Wound Cleansing: Wound #10 Right,Anterior Lower Leg: Clean wound with Normal Saline. - at clinic Cleanse wound with mild soap and water - at clinic No tub bath. - only sink bath or bed bath Wound #3 Right Calcaneus: Clean wound with Normal Saline. - at clinic Cleanse wound with mild soap and water - at clinic No tub bath. - only sink bath or bed bath Wound #9 Left,Circumferential Lower Leg: Clean wound with Normal Saline. - at clinic Cleanse wound with mild soap and water - at clinic No tub bath. - only sink bath or bed bath Anesthetic: Wound #10 Right,Anterior Lower Leg: Topical Lidocaine 4% cream applied to wound bed prior to debridement - for clinic use only Wound #3 Right Calcaneus: Topical Lidocaine 4% cream applied to wound bed prior to debridement - for clinic use only Wound #9 Left,Circumferential Lower Leg: Topical Lidocaine 4% cream applied to  wound bed prior to debridement - for clinic use only Skin Barriers/Peri-Wound Care: Wound #10 Right,Anterior Lower Leg: Antifungal cream - Nystatin Triamcinolone Acetonide Ointment Wound #3 Right Calcaneus: Antifungal cream - Nystatin Triamcinolone Acetonide Ointment Wound #9 Left,Circumferential Lower Leg: Antifungal cream - Nystatin Wildeman, Juniel [name]. ([id number]) Triamcinolone Acetonide Ointment Primary Wound Dressing: Wound #10 Right,Anterior Lower  Leg: Aquacel Ag Wound #3 Right Calcaneus: Other: - endoform Wound #9 Left,Circumferential Lower Leg: Aquacel Ag Secondary Dressing: Wound #10 Right,Anterior Lower Leg: ABD pad XtraSorb Wound #3 Right Calcaneus: ABD pad Dry Gauze Drawtex Wound #9 Left,Circumferential Lower Leg: ABD pad XtraSorb Dressing Change Frequency: Wound #10 Right,Anterior Lower Leg: Change dressing every week Other: - SNF to change bilateral leg wrap if the drainage becomes visible but to do take off the heel dressing. Wound #3 Right Calcaneus: Change dressing every week - SNF to change bilateral leg wrap if the drainage becomes visible but to do take off the heel dressing. Wound #9 Left,Circumferential Lower Leg: Change dressing every week Follow-up Appointments: Wound #10 Right,Anterior Lower Leg: Return Appointment in 1 week. Wound #3 Right Calcaneus: Return Appointment in 1 week. Wound #9 Left,Circumferential Lower Leg: Return Appointment in 1 week. Edema Control: Wound #10 Right,Anterior Lower Leg: 3 Layer Compression System - Bilateral Compression Pump: Use compression pump on left lower extremity for 30 minutes, twice daily. Compression Pump: Use compression pump on right lower extremity for 30 minutes, twice daily. Other: - unna to anchor Wound #3 Right Calcaneus: 3 Layer Compression System - Bilateral Compression Pump: Use compression pump on left lower extremity for 30 minutes, twice daily. Compression Pump: Use compression pump on  right lower extremity for 30 minutes, twice daily. Other: - unna to anchor Wound #9 Left,Circumferential Lower Leg: 3 Layer Compression System - Bilateral Compression Pump: Use compression pump on left lower extremity for 30 minutes, twice daily. Compression Pump: Use compression pump on right lower extremity for 30 minutes, twice daily. VILAS, EDGERLY ([id number]) Other: - unna to anchor Off-Loading: Wound #10 Right,Anterior Lower Leg: Turn and reposition every 2 hours Other: - sage boots at night and float heel when lying in bed *******lymphedema pumps to be placed on pt twice a day for one hour sessions******** Wound #3 Right Calcaneus: Turn and reposition every 2 hours Other: - sage boots at night and float heel when lying in bed *******lymphedema pumps to be placed on pt twice a day for one hour sessions******** Wound #9 Left,Circumferential Lower Leg: Turn and reposition every 2 hours Other: - sage boots at night and float heel when lying in bed *******lymphedema pumps to be placed on pt twice a day for one hour sessions******** Additional Orders / Instructions: Wound #10 Right,Anterior Lower Leg: Increase protein intake. Wound #3 Right Calcaneus: Increase protein intake. Wound #9 Left,Circumferential Lower Leg: Increase protein intake. Medications-please add to medication list.: Wound #10 Right,Anterior Lower Leg: Other: - Vitamin C, Vitamin A, Zinc, multivitamin Wound #3 Right Calcaneus: Other: - Vitamin C, Vitamin A, Zinc, multivitamin Wound #9 Left,Circumferential Lower Leg: Other: - Vitamin C, Vitamin A, Zinc, multivitamin I have recommended : 1. using Endoform and Drawtex for his right heel to be changed every week 2. he has received his lymphedema pumps but he is finding it difficult to get someone to help him put these on and off at his nursing home and we will attempt to talk to the nursing supervisor there. at present he is using them once a day only. 3. We  will continue with silver alginate and a 3 layer Profore. Electronic Signature(s) Signed: 12/02/2016 1:52:17 PM By: Evlyn Kanner MD, FACS Entered By: Evlyn Kanner on 12/02/2016 13:52:17 [name] ([id number]) -------------------------------------------------------------------------------- SuperBill Details Patient Name: [name] Date of Service: 12/02/2016 Medical Record Number: [id number] Patient Account Number: [id number] Date of Birth/Sex: [birth year] (71 y.o. Male) Treating RN: Ashok Cordia, [place]  Primary Care Provider: Oretha Milch Other Clinician: Referring Provider: Oretha Milch Treating Provider/Extender: Rudene Re in Treatment: 48 Diagnosis Coding ICD-10 Codes Code Description E11.621 Type 2 diabetes mellitus with foot ulcer L89.613 Pressure ulcer of right heel, stage 3 I89.0 Lymphedema, not elsewhere classified E66.01 Morbid (severe) obesity due to excess calories M70.871 Other soft tissue disorders related to use, overuse and pressure, right ankle and foot 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 Facility Procedures CPT4 Code: [id number] Description: 11042 - DEB SUBQ TISSUE 20 SQ CM/< ICD-10 Description Diagnosis E11.621 Type 2 diabetes mellitus with foot ulcer L89.613 Pressure ulcer of right heel, stage 3 I89.0 Lymphedema, not elsewhere classified Modifier: Quantity: 1 Physician Procedures CPT4 Code: [id number] Description: 11042 - WC PHYS SUBQ TISS 20 SQ CM ICD-10 Description Diagnosis E11.621 Type 2 diabetes mellitus with foot ulcer L89.613 Pressure ulcer of right heel, stage 3 I89.0 Lymphedema, not elsewhere classified Modifier: Quantity: 1 Electronic Signature(s) Signed: 12/02/2016 1:52:32 PM By: Evlyn Kanner MD, FACS Entered By: Evlyn Kanner on 12/02/2016 13:52:32

## 2016-12-03 NOTE — Progress Notes (Signed)
REAL, CONA ([id number]) Visit Report for 12/02/2016 Arrival Information Details Patient Name: [name], [name] Date of Service: 12/02/2016 1:30 PM Medical Record Number: [id number] Patient Account Number: [id number] Date of Birth/Sex: 1946/05/20 (71 y.o. Male) Treating RN: Phillis Haggis Primary Care Blade Scheff: Oretha Milch Other Clinician: Referring Joelyn Lover: Oretha Milch Treating Shandell Jallow/Extender: Rudene Re in Treatment: 48 Visit Information History Since Last Visit All ordered tests and consults were completed: No Patient Arrived: Wheel Chair Added or deleted any medications: No Arrival Time: 13:26 Any new allergies or adverse reactions: No Accompanied By: self Had a fall or experienced change in No Transfer Assistance: EasyP[employer] activities of daily living that may affect Patient Lift risk of falls: Patient Identification Verified: Yes Signs or symptoms of abuse/neglect since last No Secondary Verification Process Yes visito Completed: Hospitalized since last visit: No Patient Requires Transmission- No Has Dressing in Place as Prescribed: Yes Based Precautions: Has Compression in Place as Prescribed: Yes Patient Has Alerts: Yes Pain Present Now: No Patient Alerts: DM II Electronic Signature(s) Signed: 12/02/2016 4:38:27 PM By: Alejandro Mulling Entered By: Alejandro Mulling on 12/02/2016 13:25:10 [name] ([id number]) -------------------------------------------------------------------------------- Encounter Discharge Information Details Patient Name: [name] Date of Service: 12/02/2016 1:30 PM Medical Record Number: [id number] Patient Account Number: [id number] Date of Birth/Sex: Dec 15, 1945 (71 y.o. Male) Treating RN: Phillis Haggis Primary Care Rudean Icenhour: Oretha Milch Other Clinician: Referring Shilo Pauwels: Oretha Milch Treating Keyshia Orwick/Extender: Rudene Re in Treatment: 82 Encounter Discharge Information Items Discharge Pain  Level: 0 Discharge Condition: Stable Ambulatory Status: Wheelchair Discharge Destination: Nursing Home Transportation: Other Accompanied By: self Schedule Follow-up Appointment: Yes Medication Reconciliation completed and provided to Patient/Care Yes Suprena Travaglini: Provided on Clinical Summary of Care: 12/02/2016 Form Type Recipient Paper Patient EL Electronic Signature(s) Signed: 12/02/2016 2:10:40 PM By: Gwenlyn Perking Entered By: Gwenlyn Perking on 12/02/2016 14:10:40 Deignan, Raun Seth Bake ([id number]) -------------------------------------------------------------------------------- Lower Extremity Assessment Details Patient Name: [name] Date of Service: 12/02/2016 1:30 PM Medical Record Number: [id number] Patient Account Number: [id number] Date of Birth/Sex: 1946-08-02 (72 y.o. Male) Treating RN: Phillis Haggis Primary Care Tambi Thole: Oretha Milch Other Clinician: Referring Panda Crossin: Oretha Milch Treating Kristofor Michalowski/Extender: Rudene Re in Treatment: 48 Edema Assessment Assessed: [Left: No] [Right: No] E[Left: dema] [Right: :] Calf Left: Right: Point of Measurement: 34 cm From Medial Instep 42.2 cm 37.5 cm Ankle Left: Right: Point of Measurement: 12 cm From Medial Instep 27.6 cm 25.2 cm Vascular Assessment Pulses: Dorsalis Pedis Palpable: [Left:Yes] [Right:Yes] Posterior Tibial Extremity colors, hair growth, and conditions: Extremity Color: [Left:Red] [Right:Red] Temperature of Extremity: [Left:Warm] [Right:Warm] Capillary Refill: [Left:< 3 seconds] [Right:< 3 seconds] Toe Nail Assessment Left: Right: Thick: Yes Yes Discolored: Yes Yes Deformed: Yes Yes Improper Length and Hygiene: No No Electronic Signature(s) Signed: 12/02/2016 4:38:27 PM By: Alejandro Mulling Entered By: Alejandro Mulling on 12/02/2016 13:34:04 Minor, Hilmer VMarland Kitchen ([id number]) -------------------------------------------------------------------------------- Multi Wound Chart Details Patient  Name: [name] Date of Service: 12/02/2016 1:30 PM Medical Record Number: [id number] Patient Account Number: [id number] Date of Birth/Sex: [birth year] (71 y.o. Male) Treating RN: Phillis Haggis Primary Care Licia Harl: Oretha Milch Other Clinician: Referring Nyia Tsao: Oretha Milch Treating Jordayn Mink/Extender: Rudene Re in Treatment: 48 Vital Signs Height(in): 70 Pulse(bpm): 72 Weight(lbs): 235 Blood Pressure 124/58 (mmHg): Body Mass Index(BMI): 34 Temperature(F): 97.7 Respiratory Rate 18 (breaths/min): Photos: [10:No Photos] [3:No Photos] [9:No Photos] Wound Location: [10:Right Lower Leg - Anterior Right Calcaneus] [9:Left Lower Leg - Circumfernential] Wounding Event: [10:Blister] [3:Pressure Injury] [9:Gradually Appeared] Primary Etiology: [10:Lymphedema] [3:Pressure Ulcer] [9:Diabetic Wound/Ulcer of  the Lower Extremity] Secondary Etiology: [10: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] [9:Venous Leg Ulcer] Comorbid History: [10:Cataracts, Chronic Obstructive Pulmonary Disease (COPD), Sleep Disease (COPD), Sleep Disease (COPD), Sleep Apnea, Angina, Congestive Heart Failure, Congestive Heart Failure, Congestive Heart Failure, Hypertension, Type II Diabetes,  Gout, Osteoarthritis, Neuropathy Osteoarthritis, Neuropathy Osteoarthritis, Neuropathy] [3:Cataracts, Chronic Obstructive Pulmonary Apnea, Angina, Hypertension, Type II Diabetes, Gout,] [9:Cataracts, Chronic Obstructive Pulmonary Apnea, Angina,  Hypertension, Type II Diabetes, Gout,] Date Acquired: [10:08/22/2016] [3:10/31/2015] [9:08/09/2016] Weeks of Treatment: [10:14] [3:48] [9:15] Wound Status: [10:Open] [3:Open] [9:Open] Clustered Wound: [10:No] [3:No] [9:Yes] Measurements L x W x D 15x10x0.1 [3:0.3x1x0.2] [9:19x43.5x0.1] (cm) Area (cm) : [10:117.81] [3:0.236] [1:610.960] Volume (cm) : [10:11.781] [3:0.047] [9:64.913] % Reduction in Area: [10:-9900.80%] [3:96.70%] [9:-548.20%] % Reduction in  Volume: -9883.90% [3:96.70%] [9:-548.20%] Classification: [10:Partial Thickness] [3:Category/Stage III] [9:Grade 2] HBO Classification: [10:Grade 1] [3:Grade 1] [9:N/A] Exudate Amount: [10:Large] [3:Large] [9:Large] Exudate Type: [10:Serosanguineous] [3:Serous] [9:Serosanguineous] Exudate Color: red, brown amber red, brown Wound Margin: Flat and Intact Flat and Intact Flat and Intact Granulation Amount: Large (67-100%) Medium (34-66%) Medium (34-66%) Granulation Quality: Red Pink Pink Necrotic Amount: Small (1-33%) Medium (34-66%) Medium (34-66%) Exposed Structures: Fascia: No Fascia: No Fascia: No Fat Layer (Subcutaneous Fat Layer (Subcutaneous Fat Layer (Subcutaneous Tissue) Exposed: No Tissue) Exposed: No Tissue) Exposed: No Tendon: No Tendon: No Tendon: No Muscle: No Muscle: No Muscle: No Joint: No Joint: No Joint: No Bone: No Bone: No Bone: No Limited to Skin Limited to Skin Limited to Skin Breakdown Breakdown Breakdown Epithelialization: Large (67-100%) None None Debridement: N/A Debridement ([id number]- N/A 11047) Pre-procedure N/A 13:41 N/A Verification/Time Out Taken: Pain Control: N/A Lidocaine 4% Topical N/A Solution Tissue Debrided: N/A Fibrin/Slough, Exudates, N/A Subcutaneous Level: N/A Skin/Subcutaneous N/A Tissue Debridement Area (sq N/A 0.3 N/A cm): Instrument: N/A Curette N/A Bleeding: N/A Minimum N/A Hemostasis Achieved: N/A Pressure N/A Procedural Pain: N/A 0 N/A Post Procedural Pain: N/A 0 N/A Debridement Treatment N/A Procedure was tolerated N/A Response: well Post Debridement N/A 0.4x1x0.2 N/A Measurements L x W x D (cm) Post Debridement N/A 0.063 N/A Volume: (cm) Post Debridement N/A Category/Stage III N/A Stage: Periwound Skin Texture: Excoriation: No Excoriation: No Excoriation: No Induration: No Induration: No Induration: No Callus: No Callus: No Callus: No Crepitus: No Crepitus: No Crepitus: No Rash: No Rash: No Rash:  No Scarring: No Scarring: No Scarring: No Periwound Skin Maceration: No Maceration: Yes Maceration: Yes Moisture: Dry/Scaly: No Dry/Scaly: No Dry/Scaly: No Pink, Jabriel V. ([id number]) Periwound Skin Color: Atrophie Blanche: No Atrophie Blanche: No Mottled: Yes Cyanosis: No Cyanosis: No Atrophie Blanche: No Ecchymosis: No Ecchymosis: No Cyanosis: No Erythema: No Erythema: No Ecchymosis: No Hemosiderin Staining: No Hemosiderin Staining: No Erythema: No Mottled: No Mottled: No Hemosiderin Staining: No Pallor: No Pallor: No Pallor: No Rubor: No Rubor: No Rubor: No Temperature: No Abnormality N/A N/A Tenderness on Yes Yes Yes Palpation: Wound Preparation: Ulcer Cleansing: Other: Ulcer Cleansing: Other: Ulcer Cleansing: Other: soap and water soap and water soap and water Topical Anesthetic Topical Anesthetic Topical Anesthetic Applied: None Applied: Other: lidocaine Applied: None 4% Procedures Performed: N/A Debridement N/A Treatment Notes Wound #10 (Right, Anterior Lower Leg) 1. Cleansed with: Clean wound with Normal Saline Cleanse wound with antibacterial soap and water 4. Dressing Applied: Aquacel Ag 5. Secondary Dressing Applied ABD Pad 7. Secured with Tape 3 Layer Compression System - Bilateral Notes xtrasorb Wound #3 (Right Calcaneus) 1. Cleansed with: Clean wound with Normal Saline Cleanse wound with antibacterial soap and water 2. Anesthetic Topical Lidocaine 4% cream  to wound bed prior to debridement 4. Dressing Applied: Other dressing (specify in notes) 5. Secondary Dressing Applied ABD Pad Notes drawtex to heel, endoform Nanni, Boe V. ([id number]) Wound #9 (Left, Circumferential Lower Leg) 1. Cleansed with: Clean wound with Normal Saline Cleanse wound with antibacterial soap and water 4. Dressing Applied: Aquacel Ag 5. Secondary Dressing Applied ABD Pad 7. Secured with Tape 3 Layer Compression System -  Bilateral Notes xtrasorb Electronic Signature(s) Signed: 12/02/2016 1:50:04 PM By: Evlyn KannerBritto, Errol MD, FACS Entered By: Evlyn KannerBritto, Errol on 12/02/2016 13:50:04 [name] OttoLINDLEY, Joenathan V. ([id number]) -------------------------------------------------------------------------------- Multi-Disciplinary Care Plan Details Patient Name: [name] OttoLINDLEY, Jacorion V. Date of Service: 12/02/2016 1:30 PM Medical Record Number: [id number] Patient Account Number: [id number] Date of Birth/Sex: May 14, 1946 [age] y.o. Male) Treating RN: Phillis HaggisPinkerton, Debi Primary Care Ezell Melikian: Oretha MilchSMITH, [name] Other Clinician: Referring Undra Harriman: Oretha MilchSMITH, [name] Treating Talis Iwan/Extender: Rudene ReBritto, Errol Weeks in Treatment: 2448 Active Inactive ` Abuse / Safety / Falls / Self Care Management Nursing Diagnoses: Potential for falls Goals: Patient will remain injury free Date Initiated: 03/01/2016 Target Resolution Date: 12/09/2016 Goal Status: Active Interventions: Assess fall risk on admission and as needed Notes: ` Nutrition Nursing Diagnoses: Imbalanced nutrition Goals: Patient/caregiver agrees to and verbalizes understanding of need to use nutritional supplements and/or vitamins as prescribed Date Initiated: 03/01/2016 Target Resolution Date: 12/09/2016 Goal Status: Active Interventions: Assess patient nutrition upon admission and as needed per policy Notes: ` Orientation to the Wound Care Program Nursing Diagnoses: Knowledge deficit related to the wound healing center program [name] OttoLINDLEY, Mong V. ([id number]) Goals: Patient/caregiver will verbalize understanding of the Wound Healing Center Program Date Initiated: 03/01/2016 Target Resolution Date: 12/09/2016 Goal Status: Active Interventions: Provide education on orientation to the wound center Notes: ` Pain, Acute or Chronic Nursing Diagnoses: Pain, acute or chronic: actual or potential Potential alteration in comfort, pain Goals: Patient will verbalize adequate pain control and receive  pain control interventions during procedures as needed Date Initiated: 03/01/2016 Target Resolution Date: 12/09/2016 Goal Status: Active Patient/caregiver will verbalize adequate pain control between visits Date Initiated: 03/01/2016 Target Resolution Date: 12/09/2016 Goal Status: Active Interventions: Assess comfort goal upon admission Complete pain assessment as per visit requirements Notes: ` Pressure Nursing Diagnoses: Knowledge deficit related to causes and risk factors for pressure ulcer development Knowledge deficit related to management of pressures ulcers Goals: Patient/caregiver will verbalize risk factors for pressure ulcer development Date Initiated: 03/01/2016 Target Resolution Date: 12/09/2016 Goal Status: Active Interventions: Assess offloading mechanisms upon admission and as needed Tidd, Kori V. ([id number]) Notes: ` Wound/Skin Impairment Nursing Diagnoses: Impaired tissue integrity Goals: Ulcer/skin breakdown will have a volume reduction of 30% by week 4 Date Initiated: 03/01/2016 Target Resolution Date: 12/09/2016 Goal Status: Active Ulcer/skin breakdown will have a volume reduction of 50% by week 8 Date Initiated: 03/01/2016 Target Resolution Date: 12/09/2016 Goal Status: Active Ulcer/skin breakdown will have a volume reduction of 80% by week 12 Date Initiated: 03/01/2016 Target Resolution Date: 12/09/2016 Goal Status: Active Interventions: Assess ulceration(s) every visit Notes: Electronic Signature(s) Signed: 12/02/2016 4:38:27 PM By: Alejandro MullingPinkerton, Debra Entered By: Alejandro MullingPinkerton, Debra on 12/02/2016 13:38:34 Jeter, Lathaniel Seth BakeV. ([id number]) -------------------------------------------------------------------------------- Pain Assessment Details Patient Name: [name] OttoLINDLEY, Brevon V. Date of Service: 12/02/2016 1:30 PM Medical Record Number: [id number] Patient Account Number: [id number] Date of Birth/Sex: May 14, 1946 [age] y.o. Male) Treating RN: Phillis HaggisPinkerton, Debi Primary  Care Adanely Reynoso: Oretha MilchSMITH, [name] Other Clinician: Referring Alayia Meggison: Oretha MilchSMITH, [name] Treating Tristain Daily/Extender: Rudene ReBritto, Errol Weeks in Treatment: 5348 Active Problems Location of Pain Severity and Description of Pain Patient Has Paino No Site Locations With Dressing  Change: No Pain Management and Medication Current Pain Management: Electronic Signature(s) Signed: 12/02/2016 4:38:27 PM By: Alejandro Mulling Entered By: Alejandro Mulling on 12/02/2016 13:28:31 [name] ([id number]) -------------------------------------------------------------------------------- Patient/Caregiver Education Details Patient Name: [name] Date of Service: 12/02/2016 1:30 PM Medical Record Number: [id number] Patient Account Number: [id number] Date of Birth/Gender: 13-Jul-1946 (71 y.o. Male) Treating RN: Phillis Haggis Primary Care Physician: Oretha Milch Other Clinician: Referring Physician: Oretha Milch Treating Physician/Extender: Rudene Re in Treatment: 35 Education Assessment Education Provided To: Patient Education Topics Provided Wound/Skin Impairment: Handouts: Other: do not get wraps wet Methods: Demonstration, Explain/Verbal Responses: State content correctly Electronic Signature(s) Signed: 12/02/2016 4:38:27 PM By: Alejandro Mulling Entered By: Alejandro Mulling on 12/02/2016 13:46:44 Carbone, Luster V. ([id number]) -------------------------------------------------------------------------------- Wound Assessment Details Patient Name: [name] Date of Service: 12/02/2016 1:30 PM Medical Record Number: [id number] Patient Account Number: [id number] Date of Birth/Sex: 1946-09-04 (71 y.o. Male) Treating RN: Phillis Haggis Primary Care Beldon Nowling: Oretha Milch Other Clinician: Referring Baldwin Racicot: Oretha Milch Treating Frenchie Dangerfield/Extender: Rudene Re in Treatment: 48 Wound Status Wound Number: 10 Primary Lymphedema Etiology: Wound Location: Right Lower Leg -  Anterior Secondary Diabetic Wound/Ulcer of the Lower Wounding Event: Blister Etiology: Extremity Date Acquired: 08/22/2016 Wound Open Weeks Of Treatment: 14 Status: Clustered Wound: No Comorbid Cataracts, Chronic Obstructive History: Pulmonary Disease (COPD), Sleep Apnea, Angina, Congestive Heart Failure, Hypertension, Type II Diabetes, Gout, Osteoarthritis, Neuropathy Photos Photo Uploaded By: Alejandro Mulling on 12/02/2016 15:23:47 Wound Measurements Length: (cm) 15 Width: (cm) 10 Depth: (cm) 0.1 Area: (cm) 117.81 Volume: (cm) 11.781 % Reduction in Area: -9900.8% % Reduction in Volume: -9883.9% Epithelialization: Large (67-100%) Tunneling: No Undermining: No Wound Description Classification: Partial Thickness Diabetic Severity [address]): Grade 1 Wound Margin: Flat and Intact Exudate Amount: Large Exudate Type: Serosanguineous Exudate Color: red, brown Cruey, Kimari V. ([id number]) Wound Bed Granulation Amount: Large (67-100%) Exposed Structure Granulation Quality: Red Fascia Exposed: No Necrotic Amount: Small (1-33%) Fat Layer (Subcutaneous Tissue) Exposed: No Necrotic Quality: Adherent Slough Tendon Exposed: No Muscle Exposed: No Joint Exposed: No Bone Exposed: No Limited to Skin Breakdown Periwound Skin Texture Texture Color No Abnormalities Noted: No No Abnormalities Noted: No Callus: No Atrophie Blanche: No Crepitus: No Cyanosis: No Excoriation: No Ecchymosis: No Induration: No Erythema: No Rash: No Hemosiderin Staining: No Scarring: No Mottled: No Pallor: No Moisture Rubor: No No Abnormalities Noted: No Dry / Scaly: No Temperature / Pain Maceration: No Temperature: No Abnormality Tenderness on Palpation: Yes Wound Preparation Ulcer Cleansing: Other: soap and water, Topical Anesthetic Applied: None Treatment Notes Wound #10 (Right, Anterior Lower Leg) 1. Cleansed with: Clean wound with Normal Saline Cleanse wound with  antibacterial soap and water 4. Dressing Applied: Aquacel Ag 5. Secondary Dressing Applied ABD Pad 7. Secured with Tape 3 Layer Compression System - Bilateral Notes xtrasorb Electronic Signature(s) Signed: 12/02/2016 4:38:27 PM By: Evette Georges, Ashe V. ([id number]) Entered By: Alejandro Mulling on 12/02/2016 13:36:54 [name] ([id number]) -------------------------------------------------------------------------------- Wound Assessment Details Patient Name: [name] Date of Service: 12/02/2016 1:30 PM Medical Record Number: [id number] Patient Account Number: [id number] Date of Birth/Sex: 1946-10-07 (71 y.o. Male) Treating RN: Phillis Haggis Primary Care Leidy Massar: Oretha Milch Other Clinician: Referring Krupa Stege: Oretha Milch Treating Wilhelmena Zea/Extender: Rudene Re in Treatment: 48 Wound Status Wound Number: 3 Primary Pressure Ulcer Etiology: Wound Location: Right Calcaneus Wound Open Wounding Event: Pressure Injury Status: Date Acquired: 10/31/2015 Comorbid Cataracts, Chronic Obstructive Weeks Of Treatment: 48 History: Pulmonary Disease (COPD), Sleep Clustered Wound: No Apnea, Angina, Congestive Heart  Failure, Hypertension, Type II Diabetes, Gout, Osteoarthritis, Neuropathy Photos Photo Uploaded By: Alejandro Mulling on 12/02/2016 15:24:06 Wound Measurements Length: (cm) 0.3 Width: (cm) 1 Depth: (cm) 0.2 Area: (cm) 0.236 Volume: (cm) 0.047 % Reduction in Area: 96.7% % Reduction in Volume: 96.7% Epithelialization: None Tunneling: No Undermining: No Wound Description Classification: Category/Stage III Foul Odor A Diabetic Severity (Wagner): Grade 1 Wound Margin: Flat and Intact Exudate Amount: Large Exudate Type: Serous Exudate Color: amber fter Cleansing: No Wound Bed Granulation Amount: Medium (34-66%) Exposed Structure Shor, Dolores V. ([id number]) Granulation Quality: Pink Fascia Exposed: No Necrotic Amount:  Medium (34-66%) Fat Layer (Subcutaneous Tissue) Exposed: No Necrotic Quality: Adherent Slough Tendon Exposed: No Muscle Exposed: No Joint Exposed: No Bone Exposed: No Limited to Skin Breakdown Periwound Skin Texture Texture Color No Abnormalities Noted: No No Abnormalities Noted: No Callus: No Atrophie Blanche: No Crepitus: No Cyanosis: No Excoriation: No Ecchymosis: No Induration: No Erythema: No Rash: No Hemosiderin Staining: No Scarring: No Mottled: No Pallor: No Moisture Rubor: No No Abnormalities Noted: No Dry / Scaly: No Temperature / Pain Maceration: Yes Tenderness on Palpation: Yes Wound Preparation Ulcer Cleansing: Other: soap and water, Topical Anesthetic Applied: Other: lidocaine 4%, Treatment Notes Wound #3 (Right Calcaneus) 1. Cleansed with: Clean wound with Normal Saline Cleanse wound with antibacterial soap and water 2. Anesthetic Topical Lidocaine 4% cream to wound bed prior to debridement 4. Dressing Applied: Other dressing (specify in notes) 5. Secondary Dressing Applied ABD Pad Notes drawtex to heel, endoform Electronic Signature(s) Signed: 12/02/2016 4:38:27 PM By: Alejandro Mulling Entered By: Alejandro Mulling on 12/02/2016 13:37:32 Magel, Dawayne V. ([id number]) -------------------------------------------------------------------------------- Wound Assessment Details Patient Name: [name] Date of Service: 12/02/2016 1:30 PM Medical Record Number: [id number] Patient Account Number: [id number] Date of Birth/Sex: 11-Nov-1946 (71 y.o. Male) Treating RN: Phillis Haggis Primary Care Loc Feinstein: Oretha Milch Other Clinician: Referring Dava Rensch: Oretha Milch Treating Jacqulyn Barresi/Extender: Rudene Re in Treatment: 48 Wound Status Wound Number: 9 Primary Diabetic Wound/Ulcer of the Lower Etiology: Extremity Wound Location: Left Lower Leg - Circumfernential Secondary Venous Leg Ulcer Etiology: Wounding Event: Gradually  Appeared Wound Open Date Acquired: 08/09/2016 Status: Weeks Of Treatment: 15 Comorbid Cataracts, Chronic Obstructive Clustered Wound: Yes History: Pulmonary Disease (COPD), Sleep Apnea, Angina, Congestive Heart Failure, Hypertension, Type II Diabetes, Gout, Osteoarthritis, Neuropathy Photos Photo Uploaded By: Alejandro Mulling on 12/02/2016 15:24:21 Wound Measurements Length: (cm) 19 Width: (cm) 43.5 Depth: (cm) 0.1 Area: (cm) 649.132 Volume: (cm) 64.913 % Reduction in Area: -548.2% % Reduction in Volume: -548.2% Epithelialization: None Tunneling: No Undermining: No Wound Description Classification: Grade 2 Wound Margin: Flat and Intact Exudate Amount: Large Exudate Type: Serosanguineous Exudate Color: red, brown Mesick, Ygnacio V. ([id number]) Foul Odor After Cleansing: No Wound Bed Granulation Amount: Medium (34-66%) Exposed Structure Granulation Quality: Pink Fascia Exposed: No Necrotic Amount: Medium (34-66%) Fat Layer (Subcutaneous Tissue) Exposed: No Necrotic Quality: Adherent Slough Tendon Exposed: No Muscle Exposed: No Joint Exposed: No Bone Exposed: No Limited to Skin Breakdown Periwound Skin Texture Texture Color No Abnormalities Noted: No No Abnormalities Noted: No Callus: No Atrophie Blanche: No Crepitus: No Cyanosis: No Excoriation: No Ecchymosis: No Induration: No Erythema: No Rash: No Hemosiderin Staining: No Scarring: No Mottled: Yes Pallor: No Moisture Rubor: No No Abnormalities Noted: No Dry / Scaly: No Temperature / Pain Maceration: Yes Tenderness on Palpation: Yes Wound Preparation Ulcer Cleansing: Other: soap and water, Topical Anesthetic Applied: None Treatment Notes Wound #9 (Left, Circumferential Lower Leg) 1. Cleansed with: Clean wound with Normal Saline Cleanse wound with  antibacterial soap and water 4. Dressing Applied: Aquacel Ag 5. Secondary Dressing Applied ABD Pad 7. Secured with Tape 3 Layer Compression  System - Bilateral Notes xtrasorb Electronic Signature(s) Signed: 12/02/2016 4:38:27 PM By: Alejandro Mulling Entered By: Alejandro Mulling on 12/02/2016 13:37:52 Speirs, Markeith V. ([id number]) [name], Kainan Seth Bake ([id number]) -------------------------------------------------------------------------------- Vitals Details Patient Name: [name] Date of Service: 12/02/2016 1:30 PM Medical Record Number: [id number] Patient Account Number: [id number] Date of Birth/Sex: March 04, 1946 (71 y.o. Male) Treating RN: Phillis Haggis Primary Care Jalea Bronaugh: Oretha Milch Other Clinician: Referring Rosaleigh Brazzel: Oretha Milch Treating Sahmir Weatherbee/Extender: Rudene Re in Treatment: 48 Vital Signs Time Taken: 13:28 Temperature (F): 97.7 Height (in): 70 Pulse (bpm): 72 Weight (lbs): 235 Respiratory Rate (breaths/min): 18 Body Mass Index (BMI): 33.7 Blood Pressure (mmHg): 124/58 Reference Range: 80 - 120 mg / dl Electronic Signature(s) Signed: 12/02/2016 4:38:27 PM By: Alejandro Mulling Entered By: Alejandro Mulling on 12/02/2016 13:28:51

## 2016-12-09 ENCOUNTER — Encounter: Payer: Medicare Other | Admitting: Surgery

## 2016-12-09 DIAGNOSIS — E11621 Type 2 diabetes mellitus with foot ulcer: Secondary | ICD-10-CM | POA: Diagnosis not present

## 2016-12-09 NOTE — Progress Notes (Signed)
DESMOND, SZABO ([id number]) Visit Report for 12/09/2016 Arrival Information Details Patient Name: [name] [name]. Date of Service: 12/09/2016 1:30 PM Medical Record Number: [id number] Patient Account Number: [id number] Date of Birth/Sex: 1946-06-05 (71 y.o. Male) Treating RN: Phillis Haggis Primary Care Geovanny Sartin: Oretha Milch Other Clinician: Referring Ilaisaane Marts: Oretha Milch Treating Carissa Musick/Extender: Rudene Re in Treatment: 27 Visit Information History Since Last Visit All ordered tests and consults were completed: No Patient Arrived: Wheel Chair Added or deleted any medications: No Arrival Time: 13:34 Any new allergies or adverse reactions: No Accompanied By: self Had a fall or experienced change in No Transfer Assistance: EasyPivot activities of daily living that may affect Patient Lift risk of falls: Patient Identification Verified: Yes Signs or symptoms of abuse/neglect since last No Secondary Verification Process Yes visito Completed: Hospitalized since last visit: No Patient Requires Transmission- No Has Dressing in Place as Prescribed: Yes Based Precautions: Has Compression in Place as Prescribed: Yes Patient Has Alerts: Yes Pain Present Now: No Patient Alerts: DM II Electronic Signature(s) Signed: 12/09/2016 4:23:36 PM By: Alejandro Mulling Entered By: Alejandro Mulling on 12/09/2016 13:34:50 Estelle, Philopateer Seth Bake ([id number]) -------------------------------------------------------------------------------- Encounter Discharge Information Details Patient Name: [name] [name]. Date of Service: 12/09/2016 1:30 PM Medical Record Number: [id number] Patient Account Number: [id number] Date of Birth/Sex: [birth year] (71 y.o. Male) Treating RN: Phillis Haggis Primary Care Cyan Moultrie: Oretha Milch Other Clinician: Referring Aseel Truxillo: Oretha Milch Treating Hoyt Leanos/Extender: Rudene Re in Treatment: 69 Encounter Discharge Information Items Discharge Pain  Level: 0 Discharge Condition: Stable Ambulatory Status: Wheelchair Discharge Destination: Nursing Home Transportation: Other Accompanied By: self Schedule Follow-up Appointment: Yes Medication Reconciliation completed and provided to Patient/Care No Syaire Saber: Provided on Clinical Summary of Care: 12/09/2016 Form Type Recipient Paper Patient EL Electronic Signature(s) Signed: 12/09/2016 2:19:45 PM By: Gwenlyn Perking Entered By: Gwenlyn Perking on 12/09/2016 14:19:45 Till, Wallace Seth Bake ([id number]) -------------------------------------------------------------------------------- Lower Extremity Assessment Details Patient Name: [name] [name]. Date of Service: 12/09/2016 1:30 PM Medical Record Number: [id number] Patient Account Number: [id number] Date of Birth/Sex: September 13, 1946 (71 y.o. Male) Treating RN: Ashok Cordia, Debi Primary Care Curlie Macken: Oretha Milch Other Clinician: Referring Iniko Robles: Oretha Milch Treating Blakley Michna/Extender: Rudene Re in Treatment: 63 Edema Assessment Assessed: [Left: No] [Right: No] E[Left: dema] [Right: :] Calf Left: Right: Point of Measurement: 34 cm From Medial Instep 42 cm 37.2 cm Ankle Left: Right: Point of Measurement: 12 cm From Medial Instep 27.4 cm 25 cm Vascular Assessment Pulses: Dorsalis Pedis Palpable: [Left:Yes] [Right:Yes] Posterior Tibial Extremity colors, hair growth, and conditions: Extremity Color: [Left:Red] [Right:Red] Temperature of Extremity: [Left:Warm] [Right:Warm] Capillary Refill: [Left:< 3 seconds] [Right:< 3 seconds] Electronic Signature(s) Signed: 12/09/2016 4:23:36 PM By: Alejandro Mulling Entered By: Alejandro Mulling on 12/09/2016 13:53:10 [name] [name] VMarland Kitchen ([id number]) -------------------------------------------------------------------------------- Multi Wound Chart Details Patient Name: [name] [name]. Date of Service: 12/09/2016 1:30 PM Medical Record Number: [id number] Patient Account Number:  [id number] Date of Birth/Sex: [birth year] (71 y.o. Male) Treating RN: Phillis Haggis Primary Care Jeffrey Graefe: Oretha Milch Other Clinician: Referring Allyiah Gartner: Oretha Milch Treating Pharrell Ledford/Extender: Rudene Re in Treatment: 71 Vital Signs Height(in): 70 Pulse(bpm): 62 Weight(lbs): 235 Blood Pressure 123/65 (mmHg): Body Mass Index(BMI): 34 Temperature(F): 97.8 Respiratory Rate 18 (breaths/min): Photos: [10:No Photos] [3:No Photos] [9:No Photos] Wound Location: [10:Right Lower Leg - Anterior Right Calcaneus] [9:Left Lower Leg - Circumfernential] Wounding Event: [10:Blister] [3:Pressure Injury] [9:Gradually Appeared] Primary Etiology: [10:Lymphedema] [3:Pressure Ulcer] [9:Diabetic Wound/Ulcer of the Lower Extremity] Secondary Etiology: [10: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] [9:Venous Leg Ulcer] Comorbid History: [10:Cataracts, Chronic Obstructive  Pulmonary Disease (COPD), Sleep Disease (COPD), Sleep Disease (COPD), Sleep Apnea, Angina, Congestive Heart Failure, Congestive Heart Failure, Congestive Heart Failure, Hypertension, Type II Diabetes,  Gout, Osteoarthritis, Neuropathy Osteoarthritis, Neuropathy Osteoarthritis, Neuropathy] [3:Cataracts, Chronic Obstructive Pulmonary Apnea, Angina, Hypertension, Type II Diabetes, Gout,] [9:Cataracts, Chronic Obstructive Pulmonary Apnea, Angina,  Hypertension, Type II Diabetes, Gout,] Date Acquired: [10:08/22/2016] [3:10/31/2015] [9:08/09/2016] Weeks of Treatment: [10:15] [3:49] [9:16] Wound Status: [10:Open] [3:Open] [9:Open] Clustered Wound: [10:No] [3:No] [9:Yes] Measurements L x W x D 15x10x0.1 [3:0.3x1x0.2] [9:19x42x0.1] (cm) Area (cm) : [10:117.81] [3:0.236] [9:626.748] Volume (cm) : [10:11.781] [3:0.047] [9:62.675] % Reduction in Area: [10:-9900.80%] [3:96.70%] [9:-525.90%] % Reduction in Volume: -9883.90% [3:96.70%] [9:-525.90%] Classification: [10:Partial Thickness] [3:Category/Stage III] [9:Grade 2] HBO  Classification: [10:Grade 1] [3:Grade 1] [9:N/A] Exudate Amount: [10:Large] [3:Large] [9:Large] Exudate Type: [10:Serosanguineous] [3:Serous] [9:Serosanguineous] Exudate Color: red, brown amber red, brown Wound Margin: Flat and Intact Flat and Intact Flat and Intact Granulation Amount: Large (67-100%) Medium (34-66%) Medium (34-66%) Granulation Quality: Red Pink Pink Necrotic Amount: Small (1-33%) Medium (34-66%) Medium (34-66%) Exposed Structures: Fascia: No Fascia: No Fascia: No Fat Layer (Subcutaneous Fat Layer (Subcutaneous Fat Layer (Subcutaneous Tissue) Exposed: No Tissue) Exposed: No Tissue) Exposed: No Tendon: No Tendon: No Tendon: No Muscle: No Muscle: No Muscle: No Joint: No Joint: No Joint: No Bone: No Bone: No Bone: No Limited to Skin Limited to Skin Limited to Skin Breakdown Breakdown Breakdown Epithelialization: Large (67-100%) None None Debridement: N/A Debridement ([id number]- N/A 11047) Pre-procedure N/A 13:54 N/A Verification/Time Out Taken: Pain Control: N/A Lidocaine 4% Topical N/A Solution Tissue Debrided: N/A Fibrin/Slough, Exudates, N/A Subcutaneous Level: N/A Skin/Subcutaneous N/A Tissue Debridement Area (sq N/A 0.3 N/A cm): Instrument: N/A Curette N/A Bleeding: N/A Minimum N/A Hemostasis Achieved: N/A Pressure N/A Procedural Pain: N/A 0 N/A Post Procedural Pain: N/A 0 N/A Debridement Treatment N/A Procedure was tolerated N/A Response: well Post Debridement N/A 0.3x1x0.2 N/A Measurements L x W x D (cm) Post Debridement N/A 0.047 N/A Volume: (cm) Post Debridement N/A Category/Stage III N/A Stage: Periwound Skin Texture: Excoriation: No Excoriation: No Excoriation: No Induration: No Induration: No Induration: No Callus: No Callus: No Callus: No Crepitus: No Crepitus: No Crepitus: No Rash: No Rash: No Rash: No Scarring: No Scarring: No Scarring: No Periwound Skin Maceration: No Maceration: Yes Maceration:  Yes Moisture: Dry/Scaly: No Dry/Scaly: No Dry/Scaly: No Jurado, [name] [name]. ([id number]) Periwound Skin Color: Atrophie Blanche: No Atrophie Blanche: No Mottled: Yes Cyanosis: No Cyanosis: No Atrophie Blanche: No Ecchymosis: No Ecchymosis: No Cyanosis: No Erythema: No Erythema: No Ecchymosis: No Hemosiderin Staining: No Hemosiderin Staining: No Erythema: No Mottled: No Mottled: No Hemosiderin Staining: No Pallor: No Pallor: No Pallor: No Rubor: No Rubor: No Rubor: No Temperature: No Abnormality N/A N/A Tenderness on Yes Yes Yes Palpation: Wound Preparation: Ulcer Cleansing: Other: Ulcer Cleansing: Other: Ulcer Cleansing: Other: soap and water soap and water soap and water Topical Anesthetic Topical Anesthetic Topical Anesthetic Applied: None Applied: Other: lidocaine Applied: None 4% Procedures Performed: N/A Debridement N/A Treatment Notes Wound #10 (Right, Anterior Lower Leg) 1. Cleansed with: Clean wound with Normal Saline Cleanse wound with antibacterial soap and water 2. Anesthetic Topical Lidocaine 4% cream to wound bed prior to debridement 4. Dressing Applied: Aquacel Ag 5. Secondary Dressing Applied ABD Pad 7. Secured with 3 Layer Compression System - Bilateral Notes xtrasorb Wound #3 (Right Calcaneus) 1. Cleansed with: Clean wound with Normal Saline Cleanse wound with antibacterial soap and water 2. Anesthetic Topical Lidocaine 4% cream to wound bed prior to debridement 4. Dressing Applied:  Other dressing (specify in notes) 5. Secondary Dressing Applied ABD Pad 7. Secured with Tape [name] [name] [name]. ([id number]) Notes drawtex to heel, endoform Wound #9 (Left, Circumferential Lower Leg) 1. Cleansed with: Clean wound with Normal Saline Cleanse wound with antibacterial soap and water 2. Anesthetic Topical Lidocaine 4% cream to wound bed prior to debridement 4. Dressing Applied: Aquacel Ag 5. Secondary Dressing Applied ABD Pad 7.  Secured with 3 Layer Compression System - Bilateral Notes xtrasorb Psychologist, prison and probation services) Signed: 12/09/2016 2:25:55 PM By: Evlyn Kanner MD, FACS Entered By: Evlyn Kanner on 12/09/2016 14:25:54 [name] [name] ([id number]) -------------------------------------------------------------------------------- Multi-Disciplinary Care Plan Details Patient Name: [name] [name] Date of Service: 12/09/2016 1:30 PM Medical Record Number: [id number] Patient Account Number: [id number] Date of Birth/Sex: [birth year] (71 y.o. Male) Treating RN: Phillis Haggis Primary Care Meg Niemeier: Oretha Milch Other Clinician: Referring Dempsy Damiano: Oretha Milch Treating Melvine Julin/Extender: Rudene Re in Treatment: 36 Active Inactive ` Abuse / Safety / Falls / Self Care Management Nursing Diagnoses: Potential for falls Goals: Patient will remain injury free Date Initiated: 03/01/2016 Target Resolution Date: 12/09/2016 Goal Status: Active Interventions: Assess fall risk on admission and as needed Notes: ` Nutrition Nursing Diagnoses: Imbalanced nutrition Goals: Patient/caregiver agrees to and verbalizes understanding of need to use nutritional supplements and/or vitamins as prescribed Date Initiated: 03/01/2016 Target Resolution Date: 12/09/2016 Goal Status: Active Interventions: Assess patient nutrition upon admission and as needed per policy Notes: ` Orientation to the Wound Care Program Nursing Diagnoses: Knowledge deficit related to the wound healing center program ANTONIE, BORJON ([id number]) Goals: Patient/caregiver will verbalize understanding of the Wound Healing Center Program Date Initiated: 03/01/2016 Target Resolution Date: 12/09/2016 Goal Status: Active Interventions: Provide education on orientation to the wound center Notes: ` Pain, Acute or Chronic Nursing Diagnoses: Pain, acute or chronic: actual or potential Potential alteration in comfort, pain Goals: Patient will  verbalize adequate pain control and receive pain control interventions during procedures as needed Date Initiated: 03/01/2016 Target Resolution Date: 12/09/2016 Goal Status: Active Patient/caregiver will verbalize adequate pain control between visits Date Initiated: 03/01/2016 Target Resolution Date: 12/09/2016 Goal Status: Active Interventions: Assess comfort goal upon admission Complete pain assessment as per visit requirements Notes: ` Pressure Nursing Diagnoses: Knowledge deficit related to causes and risk factors for pressure ulcer development Knowledge deficit related to management of pressures ulcers Goals: Patient/caregiver will verbalize risk factors for pressure ulcer development Date Initiated: 03/01/2016 Target Resolution Date: 12/09/2016 Goal Status: Active Interventions: Assess offloading mechanisms upon admission and as needed [name] [name] [name]. ([id number]) Notes: ` Wound/Skin Impairment Nursing Diagnoses: Impaired tissue integrity Goals: Ulcer/skin breakdown will have a volume reduction of 30% by week 4 Date Initiated: 03/01/2016 Target Resolution Date: 12/09/2016 Goal Status: Active Ulcer/skin breakdown will have a volume reduction of 50% by week 8 Date Initiated: 03/01/2016 Target Resolution Date: 12/09/2016 Goal Status: Active Ulcer/skin breakdown will have a volume reduction of 80% by week 12 Date Initiated: 03/01/2016 Target Resolution Date: 12/09/2016 Goal Status: Active Interventions: Assess ulceration(s) every visit Notes: Electronic Signature(s) Signed: 12/09/2016 4:23:36 PM By: Alejandro Mulling Entered By: Alejandro Mulling on 12/09/2016 13:52:16 [name] [name] [name] [name]. ([id number]) -------------------------------------------------------------------------------- Pain Assessment Details Patient Name: [name] [name] Date of Service: 12/09/2016 1:30 PM Medical Record Number: [id number] Patient Account Number: [id number] Date of Birth/Sex: December 14, 1945 (71 y.o.  Male) Treating RN: Phillis Haggis Primary Care Aristeo Hankerson: Oretha Milch Other Clinician: Referring Zlaty Alexa: Oretha Milch Treating Magic Mohler/Extender: Rudene Re in Treatment: 47 Active Problems Location of Pain Severity and Description of Pain Patient Has  Paino No Site Locations With Dressing Change: No Pain Management and Medication Current Pain Management: Electronic Signature(s) Signed: 12/09/2016 4:23:36 PM By: Alejandro Mulling Entered By: Alejandro Mulling on 12/09/2016 13:35:45 [name], Esther Seth Bake ([id number]) -------------------------------------------------------------------------------- Patient/Caregiver Education Details Patient Name: [name] [name] Date of Service: 12/09/2016 1:30 PM Medical Record Number: [id number] Patient Account Number: [id number] Date of Birth/Gender: 02/09/1946 (71 y.o. Male) Treating RN: Phillis Haggis Primary Care Physician: Oretha Milch Other Clinician: Referring Physician: Oretha Milch Treating Physician/Extender: Rudene Re in Treatment: 36 Education Assessment Education Provided To: Patient Education Topics Provided Wound/Skin Impairment: Handouts: Other: do not get wraps wet Methods: Demonstration, Explain/Verbal Responses: State content correctly Electronic Signature(s) Signed: 12/09/2016 4:23:36 PM By: Alejandro Mulling Entered By: Alejandro Mulling on 12/09/2016 13:52:03 Beaudin, Jeanluc VMarland Kitchen ([id number]) -------------------------------------------------------------------------------- Wound Assessment Details Patient Name: [name] [name]. Date of Service: 12/09/2016 1:30 PM Medical Record Number: [id number] Patient Account Number: [id number] Date of Birth/Sex: 25-Aug-1946 (71 y.o. Male) Treating RN: Phillis Haggis Primary Care Naquan Garman: Oretha Milch Other Clinician: Referring Makayela Secrest: Oretha Milch Treating Elexis Pollak/Extender: Rudene Re in Treatment: 89 Wound Status Wound Number: 10 Primary  Lymphedema Etiology: Wound Location: Right Lower Leg - Anterior Secondary Diabetic Wound/Ulcer of the Lower Wounding Event: Blister Etiology: Extremity Date Acquired: 08/22/2016 Wound Open Weeks Of Treatment: 15 Status: Clustered Wound: No Comorbid Cataracts, Chronic Obstructive History: Pulmonary Disease (COPD), Sleep Apnea, Angina, Congestive Heart Failure, Hypertension, Type II Diabetes, Gout, Osteoarthritis, Neuropathy Photos Photo Uploaded By: Alejandro Mulling on 12/09/2016 14:40:21 Wound Measurements Length: (cm) 15 Width: (cm) 10 Depth: (cm) 0.1 Area: (cm) 117.81 Volume: (cm) 11.781 % Reduction in Area: -9900.8% % Reduction in Volume: -9883.9% Epithelialization: Large (67-100%) Tunneling: No Undermining: No Wound Description Classification: Partial Thickness Diabetic Severity [address]): Grade 1 Wound Margin: Flat and Intact Exudate Amount: Large Exudate Type: Serosanguineous Exudate Color: red, brown Smylie, [name] [name]. ([id number]) Wound Bed Granulation Amount: Large (67-100%) Exposed Structure Granulation Quality: Red Fascia Exposed: No Necrotic Amount: Small (1-33%) Fat Layer (Subcutaneous Tissue) Exposed: No Necrotic Quality: Adherent Slough Tendon Exposed: No Muscle Exposed: No Joint Exposed: No Bone Exposed: No Limited to Skin Breakdown Periwound Skin Texture Texture Color No Abnormalities Noted: No No Abnormalities Noted: No Callus: No Atrophie Blanche: No Crepitus: No Cyanosis: No Excoriation: No Ecchymosis: No Induration: No Erythema: No Rash: No Hemosiderin Staining: No Scarring: No Mottled: No Pallor: No Moisture Rubor: No No Abnormalities Noted: No Dry / Scaly: No Temperature / Pain Maceration: No Temperature: No Abnormality Tenderness on Palpation: Yes Wound Preparation Ulcer Cleansing: Other: soap and water, Topical Anesthetic Applied: None Treatment Notes Wound #10 (Right, Anterior Lower Leg) 1. Cleansed with: Clean  wound with Normal Saline Cleanse wound with antibacterial soap and water 2. Anesthetic Topical Lidocaine 4% cream to wound bed prior to debridement 4. Dressing Applied: Aquacel Ag 5. Secondary Dressing Applied ABD Pad 7. Secured with 3 Layer Compression System - Bilateral Notes xtrasorb Electronic Signature(s) HENDRIK, DONATH ([id number]) Signed: 12/09/2016 4:23:36 PM By: Alejandro Mulling Entered By: Alejandro Mulling on 12/09/2016 13:48:34 [name], Chi VMarland Kitchen ([id number]) -------------------------------------------------------------------------------- Wound Assessment Details Patient Name: [name] [name]. Date of Service: 12/09/2016 1:30 PM Medical Record Number: [id number] Patient Account Number: [id number] Date of Birth/Sex: 1946-06-24 (71 y.o. Male) Treating RN: Phillis Haggis Primary Care Earlisha Sharples: Oretha Milch Other Clinician: Referring Kameran Lallier: Oretha Milch Treating Shineka Auble/Extender: Rudene Re in Treatment: 40 Wound Status Wound Number: 3 Primary Pressure Ulcer Etiology: Wound Location: Right Calcaneus Wound Open Wounding Event: Pressure Injury Status: Date Acquired: 10/31/2015 Comorbid Cataracts, Chronic  Obstructive Weeks Of Treatment: 49 History: Pulmonary Disease (COPD), Sleep Clustered Wound: No Apnea, Angina, Congestive Heart Failure, Hypertension, Type II Diabetes, Gout, Osteoarthritis, Neuropathy Photos Photo Uploaded By: Alejandro Mulling on 12/09/2016 14:40:22 Wound Measurements Length: (cm) 0.3 Width: (cm) 1 Depth: (cm) 0.2 Area: (cm) 0.236 Volume: (cm) 0.047 % Reduction in Area: 96.7% % Reduction in Volume: 96.7% Epithelialization: None Tunneling: No Undermining: No Wound Description Classification: Category/Stage III Foul Odor A Diabetic Severity (Wagner): Grade 1 Wound Margin: Flat and Intact Exudate Amount: Large Exudate Type: Serous Exudate Color: amber fter Cleansing: No Wound Bed Granulation Amount: Medium (34-66%)  Exposed Structure [name] [name] [name]. ([id number]) Granulation Quality: Pink Fascia Exposed: No Necrotic Amount: Medium (34-66%) Fat Layer (Subcutaneous Tissue) Exposed: No Necrotic Quality: Adherent Slough Tendon Exposed: No Muscle Exposed: No Joint Exposed: No Bone Exposed: No Limited to Skin Breakdown Periwound Skin Texture Texture Color No Abnormalities Noted: No No Abnormalities Noted: No Callus: No Atrophie Blanche: No Crepitus: No Cyanosis: No Excoriation: No Ecchymosis: No Induration: No Erythema: No Rash: No Hemosiderin Staining: No Scarring: No Mottled: No Pallor: No Moisture Rubor: No No Abnormalities Noted: No Dry / Scaly: No Temperature / Pain Maceration: Yes Tenderness on Palpation: Yes Wound Preparation Ulcer Cleansing: Other: soap and water, Topical Anesthetic Applied: Other: lidocaine 4%, Treatment Notes Wound #3 (Right Calcaneus) 1. Cleansed with: Clean wound with Normal Saline Cleanse wound with antibacterial soap and water 2. Anesthetic Topical Lidocaine 4% cream to wound bed prior to debridement 4. Dressing Applied: Other dressing (specify in notes) 5. Secondary Dressing Applied ABD Pad 7. Secured with Tape Notes drawtex to heel, endoform Electronic Signature(s) Signed: 12/09/2016 4:23:36 PM By: Alejandro Mulling Entered By: Alejandro Mulling on 12/09/2016 13:47:53 [name] [name] [name] [name]. ([id number]) [name], [name] VMarland Kitchen ([id number]) -------------------------------------------------------------------------------- Wound Assessment Details Patient Name: [name] [name]. Date of Service: 12/09/2016 1:30 PM Medical Record Number: [id number] Patient Account Number: [id number] Date of Birth/Sex: 03-12-1946 (71 y.o. Male) Treating RN: Phillis Haggis Primary Care Khadar Monger: Oretha Milch Other Clinician: Referring Edy Belt: Oretha Milch Treating Janiah Devinney/Extender: Rudene Re in Treatment: 83 Wound Status Wound Number: 9 Primary Diabetic  Wound/Ulcer of the Lower Etiology: Extremity Wound Location: Left Lower Leg - Circumfernential Secondary Venous Leg Ulcer Etiology: Wounding Event: Gradually Appeared Wound Open Date Acquired: 08/09/2016 Status: Weeks Of Treatment: 16 Comorbid Cataracts, Chronic Obstructive Clustered Wound: Yes History: Pulmonary Disease (COPD), Sleep Apnea, Angina, Congestive Heart Failure, Hypertension, Type II Diabetes, Gout, Osteoarthritis, Neuropathy Photos Photo Uploaded By: Alejandro Mulling on 12/09/2016 14:40:36 Wound Measurements Length: (cm) 19 Width: (cm) 42 Depth: (cm) 0.1 Area: (cm) 626.748 Volume: (cm) 62.675 % Reduction in Area: -525.9% % Reduction in Volume: -525.9% Epithelialization: None Tunneling: No Undermining: No Wound Description Classification: Grade 2 Wound Margin: Flat and Intact Exudate Amount: Large Exudate Type: Serosanguineous Exudate Color: red, brown Connery, [name] [name]. ([id number]) Foul Odor After Cleansing: No Wound Bed Granulation Amount: Medium (34-66%) Exposed Structure Granulation Quality: Pink Fascia Exposed: No Necrotic Amount: Medium (34-66%) Fat Layer (Subcutaneous Tissue) Exposed: No Necrotic Quality: Adherent Slough Tendon Exposed: No Muscle Exposed: No Joint Exposed: No Bone Exposed: No Limited to Skin Breakdown Periwound Skin Texture Texture Color No Abnormalities Noted: No No Abnormalities Noted: No Callus: No Atrophie Blanche: No Crepitus: No Cyanosis: No Excoriation: No Ecchymosis: No Induration: No Erythema: No Rash: No Hemosiderin Staining: No Scarring: No Mottled: Yes Pallor: No Moisture Rubor: No No Abnormalities Noted: No Dry / Scaly: No Temperature / Pain Maceration: Yes Tenderness on Palpation: Yes Wound Preparation Ulcer Cleansing: Other: soap  and water, Topical Anesthetic Applied: None Treatment Notes Wound #9 (Left, Circumferential Lower Leg) 1. Cleansed with: Clean wound with Normal  Saline Cleanse wound with antibacterial soap and water 2. Anesthetic Topical Lidocaine 4% cream to wound bed prior to debridement 4. Dressing Applied: Aquacel Ag 5. Secondary Dressing Applied ABD Pad 7. Secured with 3 Layer Compression System - Bilateral Notes Armed forces operational officerxtrasorb Electronic Signature(s) Signed: 12/09/2016 4:23:36 PM By: Evette GeorgesPinkerton, Debra Westall, Reis [name]. ([id number]) Entered By: Alejandro MullingPinkerton, Debra on 12/09/2016 13:49:12 [name] OttoLINDLEY, [name] [name]. ([id number]) -------------------------------------------------------------------------------- Vitals Details Patient Name: [name] OttoLINDLEY, [name] [name] [name]. Date of Service: 12/09/2016 1:30 PM Medical Record Number: [id number] Patient Account Number: [id number] Date of Birth/Sex: [birth year](70 y.o. Male) Treating RN: Phillis HaggisPinkerton, Debi Primary Care Talmadge Ganas: Oretha MilchSMITH, [name] Other Clinician: Referring Angeles Zehner: Oretha MilchSMITH, [name] Treating Nitin Mckowen/Extender: Rudene ReBritto, Errol Weeks in Treatment: 2249 Vital Signs Time Taken: 13:35 Temperature (F): 97.8 Height (in): 70 Pulse (bpm): 62 Weight (lbs): 235 Respiratory Rate (breaths/min): 18 Body Mass Index (BMI): 33.7 Blood Pressure (mmHg): 123/65 Reference Range: 80 - 120 mg / dl Electronic Signature(s) Signed: 12/09/2016 4:23:36 PM By: Alejandro MullingPinkerton, Debra Entered By: Alejandro MullingPinkerton, Debra on 12/09/2016 13:38:38

## 2016-12-09 NOTE — Progress Notes (Signed)
PRANAY, HILBUN ([id number]) Visit Report for 12/09/2016 Chief Complaint Document Details Patient Name: [name], [name] Date of Service: 12/09/2016 1:30 PM Medical Record Number: [id number] Patient Account Number: [id number] Date of Birth/Sex: May 25, 1946 (71 y.o. Male) Treating RN: Phillis Haggis Primary Care Provider: Oretha Milch Other Clinician: Referring Provider: Oretha Milch Treating Provider/Extender: Rudene Re in Treatment: 67 Information Obtained from: Patient Chief Complaint [name] presents for follow-up on his right heel pressure ulcer and bilateral lower extremity lymphedema and venous ulcerations. Electronic Signature(s) Signed: 12/09/2016 2:26:10 PM By: Evlyn Kanner MD, FACS Entered By: Evlyn Kanner on 12/09/2016 14:26:10 [name] ([id number]) -------------------------------------------------------------------------------- Debridement Details Patient Name: [name] Date of Service: 12/09/2016 1:30 PM Medical Record Number: [id number] Patient Account Number: [id number] Date of Birth/Sex: Apr 11, 1946 (71 y.o. Male) Treating RN: Phillis Haggis Primary Care Provider: Oretha Milch Other Clinician: Referring Provider: Oretha Milch Treating Provider/Extender: Rudene Re in Treatment: 49 Debridement Performed for Wound #3 Right Calcaneus Assessment: Performed By: Physician Evlyn Kanner, MD Debridement: Debridement Pre-procedure Yes - 13:54 Verification/Time Out Taken: Start Time: 13:55 Pain Control: Lidocaine 4% Topical Solution Level: Skin/Subcutaneous Tissue Total Area Debrided (L x 0.3 (cm) x 1 (cm) = 0.3 (cm) W): Tissue and other Viable, Non-Viable, Exudate, Fibrin/Slough, Subcutaneous material debrided: Instrument: Curette Bleeding: Minimum Hemostasis Achieved: Pressure End Time: 13:58 Procedural Pain: 0 Post Procedural Pain: 0 Response to Treatment: Procedure was tolerated well Post Debridement Measurements of  Total Wound Length: (cm) 0.3 Stage: Category/Stage III Width: (cm) 1 Depth: (cm) 0.2 Volume: (cm) 0.047 Character of Wound/Ulcer Post Requires Further Debridement: Debridement Severity of Tissue Post Fat layer exposed Debridement: Post Procedure Diagnosis Same as Pre-procedure Electronic Signature(s) Signed: 12/09/2016 2:26:03 PM By: Evlyn Kanner MD, FACS Signed: 12/09/2016 4:23:36 PM By: Alejandro Mulling Entered By: Evlyn Kanner on 12/09/2016 14:26:03 [name] ([id number]) [name], [name] ([id number]) -------------------------------------------------------------------------------- HPI Details Patient Name: [name] Date of Service: 12/09/2016 1:30 PM Medical Record Number: [id number] Patient Account Number: [id number] Date of Birth/Sex: [birth year] (71 y.o. Male) Treating RN: Phillis Haggis Primary Care Provider: Oretha Milch Other Clinician: Referring Provider: Oretha Milch Treating Provider/Extender: Rudene Re in Treatment: 16 History of Present Illness Location: ulcerated area on the right heel, left gluteal region and thigh and then bilateral lower extremities Quality: Patient reports experiencing a sharp pain to affected area(s). Severity: Patient states wound are getting worse. Duration: Patient has had the wound for > 12 months prior to seeking treatment at the wound center Timing: Pain in wound is constant (hurts all the time) Context: The wound appeared gradually over time Modifying Factors: Other treatment(s) tried include: he sees his heart doctor and his primary care doctor Associated Signs and Symptoms: patient has not been able to walk for over a year now HPI Description: 71 year old gentleman with a known history of hypertension, diabetes, obstructive sleep apnea, COPD, diastolic CHF, coronary artery disease was admitted to the hospital with sepsis from an ulcer of the right heel and was treated there in October 2016. he also has  chronic bilateral lower extremity edema and lymphedema. He had received vancomycin, Zosyn and at that stage and x-rays showed hardware in the right ankle but no evidence of osteomyelitis. he was a former smoker. he was also treated with Augmentin orally for 10 days. He is either bedbound or wheelchair-bound and does not ambulate by himself. 01/19/2016 -- he has not been seen here for 3 weeks and this was because he was admitted to [facility]  Center between 223 and 01/08/2016 for sepsis, UTI and pneumonia involving the left lung. he was treated with IV vancomycin and Zosyn and then meropenem. Was discharged home on oral Bactrim for 2 weeks none of his vascular test or x-rays were done and we will reorder these. 01/26/2016 -- he has not yet done the x-ray of his foot and is vascular tests are still pending. I have asked him to work on these with his nursing home staff. Addendum: he has got an x-ray of the right foot done which shows that his osteopenia but no specific ostial lysis or abnormal periosteal reaction. Final impression was degenerative changes with dorsal foot soft tissue swelling. 02/16/2016 -- lower extremity arterial duplex examination shows a 50-99% stenosis of the right tibioperoneal trunk. He had biphasic flow in the right SFA, popliteal and tibioperoneal trunk. Left-sided he had triphasic flow throughout 02/29/2016 -- he is awaiting his vascular opinion with Dr. Gilda Crease which is to be done on April 24 03/08/2016 -- he has not kept his appointment with Dr. Gilda Crease on April 24 and does not seem to know what happened about this. it's difficult to gauge whether he is in full control of his mental faculties as at times he is extremely rude to the nursing staff. 03/22/2016 -- the patient was seen by Dr. 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  Recommended that the patient had severe atherosclerotic changes of both lower extremities associated with ulceration and tissue loss of the foot. This is a [name] threatening ischemia and the patient was recommended to undergo Kollmann, Echo [name]. ([id number]) angiography of the lower extremity with a hope for intervention for [name] salvage. Patient agreed and will proceed to angiography. He was admitted to the hospital between May 2 and 03/15/2016 - he underwent induction of a catheter into his right lower extremity and third order catheter placement with contrast injection to the right lower extremity for distal runoff. Percutaneous transluminal angioplasty of the right superficial femoral and popliteal arteries were done. He also had a right peroneal angioplasty. Patient had a postoperative hematoma and was admitted for observation and in the next 24 hours he was very agitated and combative and had to be seen by psychiatric. He had a follow-up with Dr. Gilda Crease in 3 weeks. 04/18/2016 -- notes reviewed from the vascular office where he was seen for his postop visit after the PTA of the right SFA and popliteal arteries on 03/12/2016. He underwent an ABI which showed right ABI to be more than 1.3 and left to be more than 1 more than 1.3, great toe and PPG waveforms are decreased bilaterally. No additional intervention indicated at this time and the patient was to follow-up in 3 months with an ABI and bilateral lower extremity duplex study. 05/02/2016 -- he complains that his compression stockings are causing him a lot of discomfort and he is not happy wearing them. 05/30/2016 -- the swelling of both legs has increased again and he has had blisters which have opened out into ulcerations. 09/12/2016 -- the lymphedema pumps are still not delivered and hopefully will be done within this week. As far as his skin substitute goes we are still awaiting a response from his insurance 10-10-2016: [name]  presents today and states that his left edema pumps have arrived but the he has only received treatment "5 times" since arrival. He also denies using the offloading  boot for the right heel but admits to having the boot. He states that staff is hesitant to assist and application of the lymphedema pumps. 10/17/2016 -- I understand he continues to take his diuretics and has been using his lymphedema pump once a day for about an hour. He cannot do it twice a day. 10-31-16 [name] presents today for follow-up on his bilateral lower extremity venous ulcers and his right posterior heel pressure ulcer. He states that he continues to do his lymphedema pumps once daily, unable to get assistance for twice daily. he does admit to increased drainage to the left leg more than the right leg. He denies any pain, fever, chills or overall ill feeling. 11/08/2016 -- the skin substitute which was run through his insurance company has a high copayment and he is not agreeable about getting this. 11/22/2016 -- he is still not able to use his lymphedema pumps as requested due to lack of help at his living facility. Electronic Signature(s) Signed: 12/09/2016 2:26:14 PM By: Evlyn Kanner MD, FACS Entered By: Evlyn Kanner on 12/09/2016 14:26:13 [name] ([id number]) -------------------------------------------------------------------------------- Physical Exam Details Patient Name: [name] Date of Service: 12/09/2016 1:30 PM Medical Record Number: [id number] Patient Account Number: [id number] Date of Birth/Sex: 07/31/1946 (71 y.o. Male) Treating RN: Phillis Haggis Primary Care Provider: Oretha Milch Other Clinician: Referring Provider: Oretha Milch Treating Provider/Extender: Rudene Re in Treatment: 77 Constitutional . Pulse regular. Respirations normal and unlabored. Afebrile. . Eyes Nonicteric. Reactive to light. Ears, Nose, Mouth, and Throat Lips, teeth, and gums WNL.Marland Kitchen Moist  mucosa without lesions. Neck supple and nontender. No palpable supraclavicular or cervical adenopathy. Normal sized without goiter. Respiratory WNL. No retractions.. Breath sounds WNL, No rubs, rales, rhonchi, or wheeze.. Cardiovascular Heart rhythm and rate regular, no murmur or gallop.. Pedal Pulses WNL. No clubbing, cyanosis or edema. Chest Breasts symmetical and no nipple discharge.. Breast tissue WNL, no masses, lumps, or tenderness.. Lymphatic No adneopathy. No adenopathy. No adenopathy. Musculoskeletal Adexa without tenderness or enlargement.. Digits and nails w/o clubbing, cyanosis, infection, petechiae, ischemia, or inflammatory conditions.. Integumentary (Hair, Skin) No suspicious lesions. No crepitus or fluctuance. No peri-wound warmth or erythema. No masses.Marland Kitchen Psychiatric Judgement and insight Intact.. No evidence of depression, anxiety, or agitation.. Notes the lymphedema and the blisters are a little better today and there is less weeping. The right calcaneus wound is markedly improved and after removing sharply the eschar and subcutaneous tenderness debride the wound is looking excellent. Electronic Signature(s) Signed: 12/09/2016 2:26:51 PM By: Evlyn Kanner MD, FACS Entered By: Evlyn Kanner on 12/09/2016 14:26:50 [name] ([id number]) -------------------------------------------------------------------------------- Physician Orders Details Patient Name: [name] Date of Service: 12/09/2016 1:30 PM Medical Record Number: [id number] Patient Account Number: [id number] Date of Birth/Sex: 11/10/1946 (71 y.o. Male) Treating RN: Phillis Haggis Primary Care Provider: Oretha Milch Other Clinician: Referring Provider: Oretha Milch Treating Provider/Extender: Rudene Re in Treatment: 3 Verbal / Phone Orders: Yes Clinician: Ashok Cordia, Debi Read Back and Verified: Yes Diagnosis Coding Wound Cleansing Wound #10 Right,Anterior Lower Leg o Clean  wound with Normal Saline. - at clinic o Cleanse wound with mild soap and water - at clinic o No tub bath. - only sink bath or bed bath Wound #3 Right Calcaneus o Clean wound with Normal Saline. - at clinic o Cleanse wound with mild soap and water - at clinic o No tub bath. - only sink bath or bed bath Wound #9 Left,Circumferential Lower Leg o Clean wound with Normal Saline. -  at clinic o Cleanse wound with mild soap and water - at clinic o No tub bath. - only sink bath or bed bath Anesthetic Wound #10 Right,Anterior Lower Leg o Topical Lidocaine 4% cream applied to wound bed prior to debridement - for clinic use only Wound #3 Right Calcaneus o Topical Lidocaine 4% cream applied to wound bed prior to debridement - for clinic use only Wound #9 Left,Circumferential Lower Leg o Topical Lidocaine 4% cream applied to wound bed prior to debridement - for clinic use only Skin Barriers/Peri-Wound Care Wound #10 Right,Anterior Lower Leg o Antifungal cream - Nystatin o Triamcinolone Acetonide Ointment Wound #3 Right Calcaneus o Antifungal cream - Nystatin o Triamcinolone Acetonide Ointment Wound #9 Left,Circumferential Lower Leg o Antifungal cream - Nystatin [name], [name] ([id number]) o Triamcinolone Acetonide Ointment Primary Wound Dressing Wound #10 Right,Anterior Lower Leg o Aquacel Ag Wound #3 Right Calcaneus o Other: - endoform Wound #9 Left,Circumferential Lower Leg o Aquacel Ag Secondary Dressing Wound #10 Right,Anterior Lower Leg o ABD pad o XtraSorb Wound #3 Right Calcaneus o ABD pad o Dry Gauze o Drawtex Wound #9 Left,Circumferential Lower Leg o ABD pad o XtraSorb Dressing Change Frequency Wound #10 Right,Anterior Lower Leg o Change dressing every week o Other: - SNF to change bilateral leg wrap if the drainage becomes visible but to do take off the heel dressing. Wound #3 Right Calcaneus o Change  dressing every week - SNF to change bilateral leg wrap if the drainage becomes visible but to do take off the heel dressing. Wound #9 Left,Circumferential Lower Leg o Change dressing every week Follow-up Appointments Wound #10 Right,Anterior Lower Leg o Return Appointment in 1 week. Wound #3 Right Calcaneus o Return Appointment in 1 week. Wound #9 Left,Circumferential Lower Leg o Return Appointment in 1 week. THEOPOLIS, SLOOP [name]. ([id number]) Edema Control Wound #10 Right,Anterior Lower Leg o 3 Layer Compression System - Bilateral o Compression Pump: Use compression pump on left lower extremity for 30 minutes, twice daily. o Compression Pump: Use compression pump on right lower extremity for 30 minutes, twice daily. o Other: - unna to anchor Wound #3 Right Calcaneus o 3 Layer Compression System - Bilateral o Compression Pump: Use compression pump on left lower extremity for 30 minutes, twice daily. o Compression Pump: Use compression pump on right lower extremity for 30 minutes, twice daily. o Other: - unna to anchor Wound #9 Left,Circumferential Lower Leg o 3 Layer Compression System - Bilateral o Compression Pump: Use compression pump on left lower extremity for 30 minutes, twice daily. o Compression Pump: Use compression pump on right lower extremity for 30 minutes, twice daily. o Other: - unna to anchor Off-Loading Wound #10 Right,Anterior Lower Leg o Turn and reposition every 2 hours o Other: - sage boots at night and float heel when lying in bed *******lymphedema pumps to be placed on pt twice a day for one hour sessions******** Wound #3 Right Calcaneus o Turn and reposition every 2 hours o Other: - sage boots at night and float heel when lying in bed *******lymphedema pumps to be placed on pt twice a day for one hour sessions******** Wound #9 Left,Circumferential Lower Leg o Turn and reposition every 2 hours o Other: - sage  boots at night and float heel when lying in bed *******lymphedema pumps to be placed on pt twice a day for one hour sessions******** Additional Orders / Instructions Wound #10 Right,Anterior Lower Leg o Increase protein intake. OSMIN, WELZ [name]. ([id number]) Wound #3 Right Calcaneus o  Increase protein intake. Wound #9 Left,Circumferential Lower Leg o Increase protein intake. Medications-please add to medication list. Wound #10 Right,Anterior Lower Leg o Other: - Vitamin C, Vitamin A, Zinc, multivitamin Wound #3 Right Calcaneus o Other: - Vitamin C, Vitamin A, Zinc, multivitamin Wound #9 Left,Circumferential Lower Leg o Other: - Vitamin C, Vitamin A, Zinc, multivitamin Electronic Signature(s) Signed: 12/09/2016 4:18:36 PM By: Evlyn Kanner MD, FACS Signed: 12/09/2016 4:23:36 PM By: Alejandro Mulling Entered By: Alejandro Mulling on 12/09/2016 13:57:08 Pino, Zeric Seth Bake ([id number]) -------------------------------------------------------------------------------- Problem List Details Patient Name: [name] Date of Service: 12/09/2016 1:30 PM Medical Record Number: [id number] Patient Account Number: [id number] Date of Birth/Sex: 23-Jan-1946 (71 y.o. Male) Treating RN: Phillis Haggis Primary Care Provider: Oretha Milch Other Clinician: Referring Provider: Oretha Milch Treating Provider/Extender: Rudene Re in Treatment: 51 Active Problems ICD-10 Encounter Code Description Active Date Diagnosis E11.621 Type 2 diabetes mellitus with foot ulcer 01/01/2016 Yes L89.613 Pressure ulcer of right heel, stage 3 01/01/2016 Yes I89.0 Lymphedema, not elsewhere classified 01/01/2016 Yes E66.01 Morbid (severe) obesity due to excess calories 01/01/2016 Yes M70.871 Other soft tissue disorders related to use, overuse and 01/19/2016 Yes pressure, right ankle and foot 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/16/2016 Yes ulceration of heel and midfoot Inactive  Problems Resolved Problems ICD-10 Code Description Active Date Resolved Date L89.322 Pressure ulcer of left buttock, stage 2 01/01/2016 01/01/2016 Electronic Signature(s) Signed: 12/09/2016 2:25:46 PM By: Evlyn Kanner MD, FACS Entered By: Evlyn Kanner on 12/09/2016 14:25:46 [name] ([id number]) [name], [name] VMarland Kitchen ([id number]) -------------------------------------------------------------------------------- Progress Note Details Patient Name: [name] Date of Service: 12/09/2016 1:30 PM Medical Record Number: [id number] Patient Account Number: [id number] Date of Birth/Sex: [birth year] (71 y.o. Male) Treating RN: Phillis Haggis Primary Care Provider: Oretha Milch Other Clinician: Referring Provider: Oretha Milch Treating Provider/Extender: Rudene Re in Treatment: 85 Subjective Chief Complaint Information obtained from Patient Mr. Mundie presents for follow-up on his right heel pressure ulcer and bilateral lower extremity lymphedema and venous ulcerations. History of Present Illness (HPI) The following HPI elements were documented for the patient's wound: Location: ulcerated area on the right heel, left gluteal region and thigh and then bilateral lower extremities Quality: Patient reports experiencing a sharp pain to affected area(s). Severity: Patient states wound are getting worse. Duration: Patient has had the wound for > 12 months prior to seeking treatment at the wound center Timing: Pain in wound is constant (hurts all the time) Context: The wound appeared gradually over time Modifying Factors: Other treatment(s) tried include: he sees his heart doctor and his primary care doctor Associated Signs and Symptoms: patient has not been able to walk for over a year now 71 year old gentleman with a known history of hypertension, diabetes, obstructive sleep apnea, COPD, diastolic CHF, coronary artery disease was admitted to the hospital with sepsis from an ulcer  of the right heel and was treated there in October 2016. he also has chronic bilateral lower extremity edema and lymphedema. He had received vancomycin, Zosyn and at that stage and x-rays showed hardware in the right ankle but no evidence of osteomyelitis. he was a former smoker. he was also treated with Augmentin orally for 10 days. He is either bedbound or wheelchair-bound and does not ambulate by himself. 01/19/2016 -- he has not been seen here for 3 weeks and this was because he was admitted to [facility] between 223 and 01/08/2016 for sepsis, UTI and pneumonia involving the left lung. he was treated  with IV vancomycin and Zosyn and then meropenem. Was discharged home on oral Bactrim for 2 weeks none of his vascular test or x-rays were done and we will reorder these. 01/26/2016 -- he has not yet done the x-ray of his foot and is vascular tests are still pending. I have asked him to work on these with his nursing home staff. Addendum: he has got an x-ray of the right foot done which shows that his osteopenia but no specific ostial lysis or abnormal periosteal reaction. Final impression was degenerative changes with dorsal foot soft tissue swelling. 02/16/2016 -- lower extremity arterial duplex examination shows a 50-99% stenosis of the right tibioperoneal trunk. He had biphasic flow in the right SFA, popliteal and tibioperoneal trunk. Left-sided he had triphasic flow throughout [name], [name] ([id number]) 02/29/2016 -- he is awaiting his vascular opinion with Dr. Gilda Crease which is to be done on April 24 03/08/2016 -- he has not kept his appointment with Dr. Gilda Crease on April 24 and does not seem to know what happened about this. it's difficult to gauge whether he is in full control of his mental faculties as at times he is extremely rude to the nursing staff. 03/22/2016 -- the patient was seen by Dr. 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 Recommended that the patient had severe atherosclerotic changes of both lower extremities associated with ulceration and tissue loss of the foot. This is a [name] threatening ischemia and the patient was recommended to undergo angiography of the lower extremity with a hope for intervention for [name] salvage. Patient agreed and will proceed to angiography. He was admitted to the hospital between May 2 and 03/15/2016 - he underwent induction of a catheter into his right lower extremity and third order catheter placement with contrast injection to the right lower extremity for distal runoff. Percutaneous transluminal angioplasty of the right superficial femoral and popliteal arteries were done. He also had a right peroneal angioplasty. Patient had a postoperative hematoma and was admitted for observation and in the next 24 hours he was very agitated and combative and had to be seen by psychiatric. He had a follow-up with Dr. Gilda Crease in 3 weeks. 04/18/2016 -- notes reviewed from the vascular office where he was seen for his postop visit after the PTA of the right SFA and popliteal arteries on 03/12/2016. He underwent an ABI which showed right ABI to be more than 1.3 and left to be more than 1 more than 1.3, great toe and PPG waveforms are decreased bilaterally. No additional intervention indicated at this time and the patient was to follow-up in 3 months with an ABI and bilateral lower extremity duplex study. 05/02/2016 -- he complains that his compression stockings are causing him a lot of discomfort and he is not happy wearing them. 05/30/2016 -- the swelling of both legs has increased again and he has had blisters which have opened out into ulcerations. 09/12/2016 -- the lymphedema pumps are still not delivered and hopefully will be done within this week. As far as his skin substitute goes we are still  awaiting a response from his insurance 10-10-2016: [name] presents today and states that his left edema pumps have arrived but the he has only received treatment "5 times" since arrival. He also denies using the offloading boot for the right heel but admits to having the boot. He states that staff is hesitant  to assist and application of the lymphedema pumps. 10/17/2016 -- I understand he continues to take his diuretics and has been using his lymphedema pump once a day for about an hour. He cannot do it twice a day. 10-31-16 [name] presents today for follow-up on his bilateral lower extremity venous ulcers and his right posterior heel pressure ulcer. He states that he continues to do his lymphedema pumps once daily, unable to get assistance for twice daily. he does admit to increased drainage to the left leg more than the right leg. He denies any pain, fever, chills or overall ill feeling. 11/08/2016 -- the skin substitute which was run through his insurance company has a high copayment and he is not agreeable about getting this. 11/22/2016 -- he is still not able to use his lymphedema pumps as requested due to lack of help at his living facility. [name], [name] ([id number]) Objective Constitutional Pulse regular. Respirations normal and unlabored. Afebrile. Vitals Time Taken: 1:35 PM, Height: 70 in, Weight: 235 lbs, BMI: 33.7, Temperature: 97.8 F, Pulse: 62 bpm, Respiratory Rate: 18 breaths/min, Blood Pressure: 123/65 mmHg. Eyes Nonicteric. Reactive to light. Ears, Nose, Mouth, and Throat Lips, teeth, and gums WNL.Marland Kitchen Moist mucosa without lesions. Neck supple and nontender. No palpable supraclavicular or cervical adenopathy. Normal sized without goiter. Respiratory WNL. No retractions.. Breath sounds WNL, No rubs, rales, rhonchi, or wheeze.. Cardiovascular Heart rhythm and rate regular, no murmur or gallop.. Pedal Pulses WNL. No clubbing, cyanosis or edema. Chest Breasts  symmetical and no nipple discharge.. Breast tissue WNL, no masses, lumps, or tenderness.. Lymphatic No adneopathy. No adenopathy. No adenopathy. Musculoskeletal Adexa without tenderness or enlargement.. Digits and nails w/o clubbing, cyanosis, infection, petechiae, ischemia, or inflammatory conditions.Marland Kitchen Psychiatric Judgement and insight Intact.. No evidence of depression, anxiety, or agitation.. General Notes: the lymphedema and the blisters are a little better today and there is less weeping. The right calcaneus wound is markedly improved and after removing sharply the eschar and subcutaneous tenderness debride the wound is looking excellent. Integumentary (Hair, Skin) No suspicious lesions. No crepitus or fluctuance. No peri-wound warmth or erythema. No masses.. Wound #10 status is Open. Original cause of wound was Blister. The wound is located on the Right,Anterior Lower Leg. The wound measures 15cm length x 10cm width x 0.1cm depth; 117.81cm^2 area and [name], [name] ([id number]) 11.781cm^3 volume. The wound is limited to skin breakdown. There is no tunneling or undermining noted. There is a large amount of serosanguineous drainage noted. The wound margin is flat and intact. There is large (67-100%) red granulation within the wound bed. There is a small (1-33%) amount of necrotic tissue within the wound bed including Adherent Slough. The periwound skin appearance did not exhibit: Callus, Crepitus, Excoriation, Induration, Rash, Scarring, Dry/Scaly, Maceration, Atrophie Blanche, Cyanosis, Ecchymosis, Hemosiderin Staining, Mottled, Pallor, Rubor, Erythema. Periwound temperature was noted as No Abnormality. The periwound has tenderness on palpation. Wound #3 status is Open. Original cause of wound was Pressure Injury. The wound is located on the Right Calcaneus. The wound measures 0.3cm length x 1cm width x 0.2cm depth; 0.236cm^2 area and 0.047cm^3 volume. The wound is limited to skin  breakdown. There is no tunneling or undermining noted. There is a large amount of serous drainage noted. The wound margin is flat and intact. There is medium (34-66%) pink granulation within the wound bed. There is a medium (34-66%) amount of necrotic tissue within the wound bed including Adherent Slough. The periwound skin appearance exhibited: Maceration. The periwound skin  appearance did not exhibit: Callus, Crepitus, Excoriation, Induration, Rash, Scarring, Dry/Scaly, Atrophie Blanche, Cyanosis, Ecchymosis, Hemosiderin Staining, Mottled, Pallor, Rubor, Erythema. The periwound has tenderness on palpation. Wound #9 status is Open. Original cause of wound was Gradually Appeared. The wound is located on the Left,Circumferential Lower Leg. The wound measures 19cm length x 42cm width x 0.1cm depth; 626.748cm^2 area and 62.675cm^3 volume. The wound is limited to skin breakdown. There is no tunneling or undermining noted. There is a large amount of serosanguineous drainage noted. The wound margin is flat and intact. There is medium (34-66%) pink granulation within the wound bed. There is a medium (34- 66%) amount of necrotic tissue within the wound bed including Adherent Slough. The periwound skin appearance exhibited: Maceration, Mottled. The periwound skin appearance did not exhibit: Callus, Crepitus, Excoriation, Induration, Rash, Scarring, Dry/Scaly, Atrophie Blanche, Cyanosis, Ecchymosis, Hemosiderin Staining, Pallor, Rubor, Erythema. The periwound has tenderness on palpation. 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 ([id number]) Wound #3 Wound #3 is a Pressure Ulcer located on the Right Calcaneus . There was a Skin/Subcutaneous Tissue Debridement [id number]) debridement with total area of 0.3 sq cm performed by Evlyn KannerBritto, Chistine Dematteo, MD. with the following instrument(s): Curette to remove Viable and Non-Viable tissue/material including Exudate, Fibrin/Slough, and Subcutaneous after achieving pain control using Lidocaine 4% Topical Solution. A time out was conducted at 13:54, prior to the start of the procedure. A Minimum amount of bleeding was controlled with Pressure. The procedure was tolerated well with a pain level of 0 throughout and a pain level of 0 following the procedure. Post Debridement Measurements: 0.3cm length x 1cm width x 0.2cm depth; 0.047cm^3 volume. Post debridement Stage noted as Category/Stage III. Character of Wound/Ulcer Post Debridement requires further debridement. Severity of Tissue Post Debridement is: Fat layer exposed. Post procedure Diagnosis Wound #3: Same as Pre-Procedure Plan Wound Cleansing: Wound #10 Right,Anterior Lower Leg: Clean wound with Normal Saline. - at clinic Cleanse wound with mild soap and water - at clinic No tub bath. - only sink bath or bed bath Wound #3 Right Calcaneus: Clean wound with Normal Saline. - at clinic Cleanse wound with mild soap and water - at clinic No tub bath. - only sink bath or bed bath Wound #9 Left,Circumferential Lower Leg: Clean wound with Normal Saline. - at clinic Cleanse wound with mild soap and water - at clinic No tub bath. - only sink bath or bed bath Anesthetic: Wound #10 Right,Anterior Lower Leg: Topical Lidocaine 4% cream applied to wound bed prior to debridement - for clinic use only Wound #3 Right Calcaneus: Topical Lidocaine 4% cream applied to wound bed prior to debridement - for clinic use only Wound #9 Left,Circumferential Lower Leg: Topical Lidocaine 4% cream applied to wound  bed prior to debridement - for clinic use only Skin Barriers/Peri-Wound Care: Wound #10 Right,Anterior Lower Leg: Antifungal cream - Nystatin Triamcinolone Acetonide Ointment Wound #3 Right Calcaneus: Antifungal cream - Nystatin Triamcinolone Acetonide Ointment Wound #9 Left,Circumferential Lower Leg: Antifungal cream - Nystatin [name], [name] ([id number]) Triamcinolone Acetonide Ointment Primary Wound Dressing: Wound #10 Right,Anterior Lower Leg: Aquacel Ag Wound #3 Right Calcaneus:  Other: - endoform Wound #9 Left,Circumferential Lower Leg: Aquacel Ag Secondary Dressing: Wound #10 Right,Anterior Lower Leg: ABD pad XtraSorb Wound #3 Right Calcaneus: ABD pad Dry Gauze Drawtex Wound #9 Left,Circumferential Lower Leg: ABD pad XtraSorb Dressing Change Frequency: Wound #10 Right,Anterior Lower Leg: Change dressing every week Other: - SNF to change bilateral leg wrap if the drainage becomes visible but to do take off the heel dressing. Wound #3 Right Calcaneus: Change dressing every week - SNF to change bilateral leg wrap if the drainage becomes visible but to do take off the heel dressing. Wound #9 Left,Circumferential Lower Leg: Change dressing every week Follow-up Appointments: Wound #10 Right,Anterior Lower Leg: Return Appointment in 1 week. Wound #3 Right Calcaneus: Return Appointment in 1 week. Wound #9 Left,Circumferential Lower Leg: Return Appointment in 1 week. Edema Control: Wound #10 Right,Anterior Lower Leg: 3 Layer Compression System - Bilateral Compression Pump: Use compression pump on left lower extremity for 30 minutes, twice daily. Compression Pump: Use compression pump on right lower extremity for 30 minutes, twice daily. Other: - unna to anchor Wound #3 Right Calcaneus: 3 Layer Compression System - Bilateral Compression Pump: Use compression pump on left lower extremity for 30 minutes, twice daily. Compression Pump: Use compression pump on right  lower extremity for 30 minutes, twice daily. Other: - unna to anchor Wound #9 Left,Circumferential Lower Leg: 3 Layer Compression System - Bilateral Compression Pump: Use compression pump on left lower extremity for 30 minutes, twice daily. Compression Pump: Use compression pump on right lower extremity for 30 minutes, twice daily. [name], [name] ([id number]) Other: - unna to anchor Off-Loading: Wound #10 Right,Anterior Lower Leg: Turn and reposition every 2 hours Other: - sage boots at night and float heel when lying in bed *******lymphedema pumps to be placed on pt twice a day for one hour sessions******** Wound #3 Right Calcaneus: Turn and reposition every 2 hours Other: - sage boots at night and float heel when lying in bed *******lymphedema pumps to be placed on pt twice a day for one hour sessions******** Wound #9 Left,Circumferential Lower Leg: Turn and reposition every 2 hours Other: - sage boots at night and float heel when lying in bed *******lymphedema pumps to be placed on pt twice a day for one hour sessions******** Additional Orders / Instructions: Wound #10 Right,Anterior Lower Leg: Increase protein intake. Wound #3 Right Calcaneus: Increase protein intake. Wound #9 Left,Circumferential Lower Leg: Increase protein intake. Medications-please add to medication list.: Wound #10 Right,Anterior Lower Leg: Other: - Vitamin C, Vitamin A, Zinc, multivitamin Wound #3 Right Calcaneus: Other: - Vitamin C, Vitamin A, Zinc, multivitamin Wound #9 Left,Circumferential Lower Leg: Other: - Vitamin C, Vitamin A, Zinc, multivitamin I have recommended : 1. using Endoform and Drawtex for his right heel to be changed every week 2. he has received his lymphedema pumps but he is finding it difficult to get someone to help him put these on and off at his nursing home and we will attempt to talk to the nursing supervisor there. at present he is using them once a day only. 3. We will  continue with silver alginate and a 3 layer Profore. Electronic Signature(s) Signed: 12/09/2016 2:27:20 PM By: Evlyn Kanner MD, FACS Entered By: Evlyn Kanner on 12/09/2016 14:27:20 [name] ([id number]) -------------------------------------------------------------------------------- SuperBill Details Patient Name: [name] Date of Service: 12/09/2016 Medical Record Number: [id number] Patient Account Number: [id number] Date of Birth/Sex: Jun 24, 1946 (71 y.o. 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 Facility Procedures CPT4: Description Modifier Quantity Code 16109604 11042 - DEB SUBQ TISSUE 20 SQ CM/< 1 ICD-10 Description Diagnosis E11.621 Type 2 diabetes mellitus with foot ulcer 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 L89.613 Pressure ulcer of right heel, stage 3 I89.0 Lymphedema, not elsewhere classified Physician Procedures CPT4: Description Modifier Quantity Code 5409811 11042 - WC PHYS SUBQ TISS 20 SQ CM 1 ICD-10 Description Diagnosis E11.621 Type 2 diabetes mellitus with foot ulcer 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

## 2016-12-16 ENCOUNTER — Encounter: Payer: Medicare Other | Attending: Surgery | Admitting: Surgery

## 2016-12-16 DIAGNOSIS — I70234 Atherosclerosis of native arteries of right leg with ulceration of heel and midfoot: Secondary | ICD-10-CM | POA: Insufficient documentation

## 2016-12-16 DIAGNOSIS — I5032 Chronic diastolic (congestive) heart failure: Secondary | ICD-10-CM | POA: Insufficient documentation

## 2016-12-16 DIAGNOSIS — I110 Hypertensive heart disease with heart failure: Secondary | ICD-10-CM | POA: Diagnosis not present

## 2016-12-16 DIAGNOSIS — M70871 Other soft tissue disorders related to use, overuse and pressure, right ankle and foot: Secondary | ICD-10-CM | POA: Insufficient documentation

## 2016-12-16 DIAGNOSIS — J449 Chronic obstructive pulmonary disease, unspecified: Secondary | ICD-10-CM | POA: Insufficient documentation

## 2016-12-16 DIAGNOSIS — E11621 Type 2 diabetes mellitus with foot ulcer: Secondary | ICD-10-CM | POA: Diagnosis not present

## 2016-12-16 DIAGNOSIS — I890 Lymphedema, not elsewhere classified: Secondary | ICD-10-CM | POA: Insufficient documentation

## 2016-12-16 DIAGNOSIS — Z6833 Body mass index (BMI) 33.0-33.9, adult: Secondary | ICD-10-CM | POA: Diagnosis not present

## 2016-12-16 DIAGNOSIS — I2510 Atherosclerotic heart disease of native coronary artery without angina pectoris: Secondary | ICD-10-CM | POA: Insufficient documentation

## 2016-12-16 DIAGNOSIS — Z87891 Personal history of nicotine dependence: Secondary | ICD-10-CM | POA: Diagnosis not present

## 2016-12-16 DIAGNOSIS — G4733 Obstructive sleep apnea (adult) (pediatric): Secondary | ICD-10-CM | POA: Diagnosis not present

## 2016-12-16 DIAGNOSIS — L89613 Pressure ulcer of right heel, stage 3: Secondary | ICD-10-CM | POA: Insufficient documentation

## 2016-12-21 NOTE — Progress Notes (Signed)
[name], [name] ([id number]) Visit Report for 12/16/2016 Arrival Information Details Patient Name: [name], [name] Date of Service: 12/16/2016 2:30 PM Medical Record Number: [id number] Patient Account Number: [id number] Date of Birth/Sex: [birth year] (71 y.o. Male) Treating RN: Phillis Haggis Primary Care Kanika Bungert: Oretha Milch Other Clinician: Referring Kerrie Timm: Oretha Milch Treating Reyansh Kushnir/Extender: Rudene Re in Treatment: 50 Visit Information History Since Last Visit All ordered tests and consults were completed: No Patient Arrived: Wheel Chair Added or deleted any medications: No Arrival Time: 14:33 Any new allergies or adverse reactions: No Accompanied By: self Had a fall or experienced change in No Transfer Assistance: EasyPivot activities of daily living that may affect Patient Lift risk of falls: Patient Identification Verified: Yes Signs or symptoms of abuse/neglect since last No Secondary Verification Process Yes visito Completed: Hospitalized since last visit: No Patient Requires Transmission- No Has Dressing in Place as Prescribed: Yes Based Precautions: Has Compression in Place as Prescribed: Yes Patient Has Alerts: Yes Pain Present Now: No Patient Alerts: DM II Electronic Signature(s) Signed: 12/20/2016 1:35:52 PM By: Alejandro Mulling Entered By: Alejandro Mulling on 12/16/2016 14:33:36 Hammitt, Kenaz Seth Bake ([id number]) -------------------------------------------------------------------------------- Encounter Discharge Information Details Patient Name: [name] Date of Service: 12/16/2016 2:30 PM Medical Record Number: [id number] Patient Account Number: [id number] Date of Birth/Sex: 1946-10-10 (71 y.o. Male) Treating RN: Phillis Haggis Primary Care Signa Cheek: Oretha Milch Other Clinician: Referring Laasya Peyton: Oretha Milch Treating Donnelle Rubey/Extender: Rudene Re in Treatment: 57 Encounter Discharge Information Items Discharge Pain Level:  0 Discharge Condition: Stable Ambulatory Status: Wheelchair Discharge Destination: Nursing Home Transportation: Private Auto Accompanied By: self Schedule Follow-up Appointment: Yes Medication Reconciliation completed No and provided to Patient/Care Varvara Legault: Provided on Clinical Summary of Care: 12/16/2016 Form Type Recipient Paper Patient EL Electronic Signature(s) Signed: 12/20/2016 1:35:52 PM By: Alejandro Mulling Previous Signature: 12/16/2016 3:34:45 PM Version By: Gwenlyn Perking Entered By: Alejandro Mulling on 12/16/2016 15:38:33 [name] ([id number]) -------------------------------------------------------------------------------- General Visit Notes Details Patient Name: [name] Date of Service: 12/16/2016 2:30 PM Medical Record Number: [id number] Patient Account Number: [id number] Date of Birth/Sex: Jul 11, 1946 (71 y.o. Male) Treating RN: Phillis Haggis Primary Care Latrenda Irani: Oretha Milch Other Clinician: Referring Greysen Devino: Oretha Milch Treating Ceasia Elwell/Extender: Rudene Re in Treatment: 50 Notes Pt always comes in and has some kind of remark to make about different things to me and to Dr. Meyer Russel as well. He has said int he past that he was going to chop Dr. Marcie Bal head off and then jumped at him with his fists like he was going to do something but didn't and couldn't because he was sitting back in the chair. There are times however that he dose not say anything at all but many times he says things about black people (I have a bi racial daughter), he talks about my weight, or he talks to me in an inappropriate sexual manor, or just mean like how he is going to punch me or kick me like my old man does. Today the pt came in and was laughing at my head band calling me the last of the mohicans, that didn't bother me. While in the room he said I looked like a poor immigrant. Then I almost fell off the chair and he said thank God you didn't fall in  the floor there would have been a big pile of shit on the floor. I said that is not very nice to say, he said, "I know it." Also, today before Dr. Meyer Russel came in he said, "  I better behave when he comes in here." Electronic Signature(s) Signed: 12/20/2016 1:35:52 PM By: Alejandro Mulling Previous Signature: 12/16/2016 3:57:02 PM Version By: Alejandro Mulling Entered By: Alejandro Mulling on 12/16/2016 15:57:07 [name] ([id number]) -------------------------------------------------------------------------------- Lower Extremity Assessment Details Patient Name: [name] Date of Service: 12/16/2016 2:30 PM Medical Record Number: [id number] Patient Account Number: [id number] Date of Birth/Sex: 1946/01/19 (71 y.o. Male) Treating RN: Phillis Haggis Primary Care Vadis Slabach: Oretha Milch Other Clinician: Referring Zuria Fosdick: Oretha Milch Treating Mohamud Mrozek/Extender: Rudene Re in Treatment: 50 Edema Assessment Assessed: [Left: No] [Right: No] E[Left: dema] [Right: :] Calf Left: Right: Point of Measurement: 34 cm From Medial Instep 42.2 cm 27.4 cm Ankle Left: Right: Point of Measurement: 12 cm From Medial Instep 27.6 cm 25.2 cm Vascular Assessment Pulses: Dorsalis Pedis Palpable: [Left:Yes] [Right:Yes] Posterior Tibial Extremity colors, hair growth, and conditions: Extremity Color: [Left:Red] [Right:Red] Temperature of Extremity: [Left:Warm] [Right:Warm] Capillary Refill: [Left:< 3 seconds] [Right:< 3 seconds] Toe Nail Assessment Left: Right: Thick: Yes Yes Discolored: Yes Yes Deformed: Yes Yes Improper Length and Hygiene: Yes Yes Electronic Signature(s) Signed: 12/20/2016 1:35:52 PM By: Alejandro Mulling Entered By: Alejandro Mulling on 12/16/2016 14:48:48 Cassarino, Hondo VMarland Kitchen ([id number]) -------------------------------------------------------------------------------- Multi Wound Chart Details Patient Name: [name] Date of Service: 12/16/2016 2:30 PM Medical  Record Number: [id number] Patient Account Number: [id number] Date of Birth/Sex: [birth year] (71 y.o. Male) Treating RN: Phillis Haggis Primary Care Shakirah Kirkey: Oretha Milch Other Clinician: Referring Kaelan Amble: Oretha Milch Treating Christon Gallaway/Extender: Rudene Re in Treatment: 50 Vital Signs Height(in): 70 Pulse(bpm): 83 Weight(lbs): 235 Blood Pressure 129/99 (mmHg): Body Mass Index(BMI): 34 Temperature(F): 97.9 Respiratory Rate 18 (breaths/min): Photos: Wound Location: Right Lower Leg - Anterior Right Calcaneus Left Lower Leg - Circumfernential Wounding Event: Blister Pressure Injury Gradually Appeared Primary Etiology: Lymphedema Pressure Ulcer 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 Comorbid History: Cataracts, Chronic Cataracts, Chronic Cataracts, Chronic Obstructive Pulmonary Obstructive Pulmonary Obstructive Pulmonary Disease (COPD), Sleep Disease (COPD), Sleep Disease (COPD), Sleep Apnea, Angina, Apnea, Angina, Apnea, Angina, Congestive Heart Failure, Congestive Heart Failure, Congestive Heart Failure, Hypertension, Type II Hypertension, Type II Hypertension, Type II Diabetes, Gout, Diabetes, Gout, Diabetes, Gout, Osteoarthritis, Neuropathy Osteoarthritis, Neuropathy Osteoarthritis, Neuropathy Date Acquired: 08/22/2016 10/31/2015 08/09/2016 Weeks of Treatment: 16 50 17 Wound Status: Open Open Open Clustered Wound: No No Yes Measurements L x W x D 17x22x0.1 0.3x0.7x0.2 19x42x0.1 (cm) Area (cm) : 293.739 0.165 626.748 Volume (cm) : 29.374 0.033 62.675 Ziesmer, Kaylum V. ([id number]) % Reduction in Area: -24835.40% 97.70% -525.90% % Reduction in Volume: -24793.20% 97.70% -525.90% Classification: Partial Thickness Category/Stage III Grade 2 HBO Classification: Grade 1 Grade 1 N/A Exudate Amount: Large Large Large Exudate Type: Serosanguineous Serous Serosanguineous Exudate  Color: red, brown amber red, brown Wound Margin: Flat and Intact Flat and Intact Flat and Intact Granulation Amount: Large (67-100%) Medium (34-66%) Medium (34-66%) Granulation Quality: Red Pink Pink Necrotic Amount: Small (1-33%) Medium (34-66%) Medium (34-66%) Exposed Structures: Fascia: No Fascia: No Fascia: No Fat Layer (Subcutaneous Fat Layer (Subcutaneous Fat Layer (Subcutaneous Tissue) Exposed: No Tissue) Exposed: No Tissue) Exposed: No Tendon: No Tendon: No Tendon: No Muscle: No Muscle: No Muscle: No Joint: No Joint: No Joint: No Bone: No Bone: No Bone: No Limited to Skin Limited to Skin Limited to Skin Breakdown Breakdown Breakdown Epithelialization: None None None Debridement: N/A Debridement ([id number] N/A [id number]) Pre-procedure N/A 14:57 N/A Verification/Time Out Taken: Pain Control: N/A Lidocaine 4% Topical N/A Solution Tissue Debrided:  N/A Fibrin/Slough, Exudates, N/A Subcutaneous Level: N/A Skin/Subcutaneous N/A Tissue Debridement Area (sq N/A 0.21 N/A cm): Instrument: N/A Curette N/A Bleeding: N/A Minimum N/A Hemostasis Achieved: N/A Pressure N/A Procedural Pain: N/A 0 N/A Post Procedural Pain: N/A 0 N/A Debridement Treatment N/A Procedure was tolerated N/A Response: well Post Debridement N/A 0.3x0.7x0.7 N/A Measurements L x W x D (cm) Post Debridement N/A 0.115 N/A Volume: (cm) Post Debridement N/A Category/Stage III N/A Stage: Periwound Skin Texture: Cicero, Javyn V. ([id number]) Excoriation: No Excoriation: No Excoriation: No Induration: No Induration: No Induration: No Callus: No Callus: No Callus: No Crepitus: No Crepitus: No Crepitus: No Rash: No Rash: No Rash: No Scarring: No Scarring: No Scarring: No Periwound Skin Maceration: No Maceration: Yes Maceration: Yes Moisture: Dry/Scaly: No Dry/Scaly: No Dry/Scaly: No Periwound Skin Color: Atrophie Blanche: No Atrophie Blanche: No Mottled: Yes Cyanosis: No Cyanosis:  No Atrophie Blanche: No Ecchymosis: No Ecchymosis: No Cyanosis: No Erythema: No Erythema: No Ecchymosis: No Hemosiderin Staining: No Hemosiderin Staining: No Erythema: No Mottled: No Mottled: No Hemosiderin Staining: No Pallor: No Pallor: No Pallor: No Rubor: No Rubor: No Rubor: No Temperature: No Abnormality N/A N/A Tenderness on Yes Yes Yes Palpation: Wound Preparation: Ulcer Cleansing: Other: Ulcer Cleansing: Other: Ulcer Cleansing: Other: soap and water soap and water soap and water Topical Anesthetic Topical Anesthetic Topical Anesthetic Applied: None Applied: Other: lidocaine Applied: None 4% Procedures Performed: N/A Debridement N/A Treatment Notes Electronic Signature(s) Signed: 12/16/2016 3:15:01 PM By: Evlyn Kanner MD, FACS Entered By: Evlyn Kanner on 12/16/2016 15:15:01 [name] ([id number]) -------------------------------------------------------------------------------- Multi-Disciplinary Care Plan Details Patient Name: [name] Date of Service: 12/16/2016 2:30 PM Medical Record Number: [id number] Patient Account Number: [id number] Date of Birth/Sex: 16-Jun-1946 (71 y.o. Male) Treating RN: Phillis Haggis Primary Care Lavern Crimi: Oretha Milch Other Clinician: Referring Jakari Sada: Oretha Milch Treating Silas Sedam/Extender: Rudene Re in Treatment: 73 Active Inactive ` Abuse / Safety / Falls / Self Care Management Nursing Diagnoses: Potential for falls Goals: Patient will remain injury free Date Initiated: 03/01/2016 Target Resolution Date: 12/09/2016 Goal Status: Active Interventions: Assess fall risk on admission and as needed Notes: ` Nutrition Nursing Diagnoses: Imbalanced nutrition Goals: Patient/caregiver agrees to and verbalizes understanding of need to use nutritional supplements and/or vitamins as prescribed Date Initiated: 03/01/2016 Target Resolution Date: 12/09/2016 Goal Status: Active Interventions: Assess  patient nutrition upon admission and as needed per policy Notes: ` Orientation to the Wound Care Program Nursing Diagnoses: Knowledge deficit related to the wound healing center program MATAS, BURROWS ([id number]) Goals: Patient/caregiver will verbalize understanding of the Wound Healing Center Program Date Initiated: 03/01/2016 Target Resolution Date: 12/09/2016 Goal Status: Active Interventions: Provide education on orientation to the wound center Notes: ` Pain, Acute or Chronic Nursing Diagnoses: Pain, acute or chronic: actual or potential Potential alteration in comfort, pain Goals: Patient will verbalize adequate pain control and receive pain control interventions during procedures as needed Date Initiated: 03/01/2016 Target Resolution Date: 12/09/2016 Goal Status: Active Patient/caregiver will verbalize adequate pain control between visits Date Initiated: 03/01/2016 Target Resolution Date: 12/09/2016 Goal Status: Active Interventions: Assess comfort goal upon admission Complete pain assessment as per visit requirements Notes: ` Pressure Nursing Diagnoses: Knowledge deficit related to causes and risk factors for pressure ulcer development Knowledge deficit related to management of pressures ulcers Goals: Patient/caregiver will verbalize risk factors for pressure ulcer development Date Initiated: 03/01/2016 Target Resolution Date: 12/09/2016 Goal Status: Active Interventions: Assess offloading mechanisms upon admission and as needed Poullard, Jackie V. ([id number]) Notes: ` Wound/Skin Impairment  Nursing Diagnoses: Impaired tissue integrity Goals: Ulcer/skin breakdown will have a volume reduction of 30% by week 4 Date Initiated: 03/01/2016 Target Resolution Date: 12/09/2016 Goal Status: Active Ulcer/skin breakdown will have a volume reduction of 50% by week 8 Date Initiated: 03/01/2016 Target Resolution Date: 12/09/2016 Goal Status: Active Ulcer/skin breakdown will  have a volume reduction of 80% by week 12 Date Initiated: 03/01/2016 Target Resolution Date: 12/09/2016 Goal Status: Active Interventions: Assess ulceration(s) every visit Notes: Electronic Signature(s) Signed: 12/20/2016 1:35:52 PM By: Alejandro Mulling Entered By: Alejandro Mulling on 12/16/2016 14:51:24 Trevino, Claris VMarland Kitchen ([id number]) -------------------------------------------------------------------------------- Pain Assessment Details Patient Name: [name] Date of Service: 12/16/2016 2:30 PM Medical Record Number: [id number] Patient Account Number: [id number] Date of Birth/Sex: 01-24-1946 (71 y.o. Male) Treating RN: Phillis Haggis Primary Care Emmajo Bennette: Oretha Milch Other Clinician: Referring Maegen Wigle: Oretha Milch Treating Leaann Nevils/Extender: Rudene Re in Treatment: 50 Active Problems Location of Pain Severity and Description of Pain Patient Has Paino No Site Locations With Dressing Change: No Pain Management and Medication Current Pain Management: Electronic Signature(s) Signed: 12/20/2016 1:35:52 PM By: Alejandro Mulling Entered By: Alejandro Mulling on 12/16/2016 14:33:41 [name] ([id number]) -------------------------------------------------------------------------------- Patient/Caregiver Education Details Patient Name: [name] Date of Service: 12/16/2016 2:30 PM Medical Record Number: [id number] Patient Account Number: [id number] Date of Birth/Gender: 1946/01/12 (71 y.o. Male) Treating RN: Phillis Haggis Primary Care Physician: Oretha Milch Other Clinician: Referring Physician: Oretha Milch Treating Physician/Extender: Rudene Re in Treatment: 27 Education Assessment Education Provided To: Patient Education Topics Provided Wound/Skin Impairment: Handouts: Other: change dressing as ordered, do not get wrap wet Methods: Demonstration, Explain/Verbal Responses: State content correctly Electronic Signature(s) Signed: 12/20/2016  1:35:52 PM By: Alejandro Mulling Entered By: Alejandro Mulling on 12/16/2016 14:51:16 Babers, Yavier V. ([id number]) -------------------------------------------------------------------------------- Wound Assessment Details Patient Name: [name] Date of Service: 12/16/2016 2:30 PM Medical Record Number: [id number] Patient Account Number: [id number] Date of Birth/Sex: 1946-07-14 (71 y.o. Male) Treating RN: Phillis Haggis Primary Care Sharna Gabrys: Oretha Milch Other Clinician: Referring Mackinley Cassaday: Oretha Milch Treating Aysiah Jurado/Extender: Rudene Re in Treatment: 50 Wound Status Wound Number: 10 Primary Lymphedema Etiology: Wound Location: Right Lower Leg - Anterior Secondary Diabetic Wound/Ulcer of the Lower Wounding Event: Blister Etiology: Extremity Date Acquired: 08/22/2016 Wound Open Weeks Of Treatment: 16 Status: Clustered Wound: No Comorbid Cataracts, Chronic Obstructive History: Pulmonary Disease (COPD), Sleep Apnea, Angina, Congestive Heart Failure, Hypertension, Type II Diabetes, Gout, Osteoarthritis, Neuropathy Photos Photo Uploaded By: Alejandro Mulling on 12/16/2016 14:52:22 Wound Measurements Length: (cm) 17 Width: (cm) 22 Depth: (cm) 0.1 Area: (cm) 293.739 Volume: (cm) 29.374 % Reduction in Area: -24835.4% % Reduction in Volume: -24793.2% Epithelialization: None Tunneling: No Wound Description Classification: Partial Thickness Diabetic Severity [address]): Grade 1 Wound Margin: Flat and Intact Exudate Amount: Large Exudate Type: Serosanguineous Exudate Color: red, brown Glab, Kaoru V. ([id number]) Wound Bed Granulation Amount: Large (67-100%) Exposed Structure Granulation Quality: Red Fascia Exposed: No Necrotic Amount: Small (1-33%) Fat Layer (Subcutaneous Tissue) Exposed: No Necrotic Quality: Adherent Slough Tendon Exposed: No Muscle Exposed: No Joint Exposed: No Bone Exposed: No Limited to Skin Breakdown Periwound Skin  Texture Texture Color No Abnormalities Noted: No No Abnormalities Noted: No Callus: No Atrophie Blanche: No Crepitus: No Cyanosis: No Excoriation: No Ecchymosis: No Induration: No Erythema: No Rash: No Hemosiderin Staining: No Scarring: No Mottled: No Pallor: No Moisture Rubor: No No Abnormalities Noted: No Dry / Scaly: No Temperature / Pain Maceration: No Temperature: No Abnormality Tenderness on Palpation: Yes Wound Preparation Ulcer Cleansing: Other: soap and  water, Topical Anesthetic Applied: None Treatment Notes Wound #10 (Right, Anterior Lower Leg) 1. Cleansed with: Clean wound with Normal Saline 2. Anesthetic Topical Lidocaine 4% cream to wound bed prior to debridement 4. Dressing Applied: Aquacel Ag 5. Secondary Dressing Applied ABD Pad 7. Secured with Tape 3 Layer Compression System - Bilateral Notes drawtex Electronic Signature(s) [name] OttoLINDLEY, Manjot V. ([id number]) Signed: 12/20/2016 1:35:52 PM By: Alejandro MullingPinkerton, Debra Entered By: Alejandro MullingPinkerton, Debra on 12/16/2016 14:47:32 Derouin, Jeremian V. ([id number]) -------------------------------------------------------------------------------- Wound Assessment Details Patient Name: [name] OttoLINDLEY, Shaquil V. Date of Service: 12/16/2016 2:30 PM Medical Record Number: [id number] Patient Account Number: [id number] Date of Birth/Sex: 1946/08/17 83(70 y.o. Male) Treating RN: Phillis HaggisPinkerton, Debi Primary Care Zyrah Wiswell: Oretha MilchSMITH, [name] Other Clinician: Referring Sherhonda Gaspar: Oretha MilchSMITH, [name] Treating Jaqualin Serpa/Extender: Rudene ReBritto, Errol Weeks in Treatment: 50 Wound Status Wound Number: 3 Primary Pressure Ulcer Etiology: Wound Location: Right Calcaneus Wound Open Wounding Event: Pressure Injury Status: Date Acquired: 10/31/2015 Comorbid Cataracts, Chronic Obstructive Weeks Of Treatment: 50 History: Pulmonary Disease (COPD), Sleep Clustered Wound: No Apnea, Angina, Congestive Heart Failure, Hypertension, Type II Diabetes, Gout, Osteoarthritis,  Neuropathy Photos Photo Uploaded By: Alejandro MullingPinkerton, Debra on 12/16/2016 14:52:22 Wound Measurements Length: (cm) 0.3 Width: (cm) 0.7 Depth: (cm) 0.2 Area: (cm) 0.165 Volume: (cm) 0.033 % Reduction in Area: 97.7% % Reduction in Volume: 97.7% Epithelialization: None Tunneling: No Undermining: No Wound Description Classification: Category/Stage III Foul Odor A Diabetic Severity (Wagner): Grade 1 Wound Margin: Flat and Intact Exudate Amount: Large Exudate Type: Serous Exudate Color: amber fter Cleansing: No Wound Bed Granulation Amount: Medium (34-66%) Exposed Structure Lafauci, Kaide V. ([id number]) Granulation Quality: Pink Fascia Exposed: No Necrotic Amount: Medium (34-66%) Fat Layer (Subcutaneous Tissue) Exposed: No Necrotic Quality: Adherent Slough Tendon Exposed: No Muscle Exposed: No Joint Exposed: No Bone Exposed: No Limited to Skin Breakdown Periwound Skin Texture Texture Color No Abnormalities Noted: No No Abnormalities Noted: No Callus: No Atrophie Blanche: No Crepitus: No Cyanosis: No Excoriation: No Ecchymosis: No Induration: No Erythema: No Rash: No Hemosiderin Staining: No Scarring: No Mottled: No Pallor: No Moisture Rubor: No No Abnormalities Noted: No Dry / Scaly: No Temperature / Pain Maceration: Yes Tenderness on Palpation: Yes Wound Preparation Ulcer Cleansing: Other: soap and water, Topical Anesthetic Applied: Other: lidocaine 4%, Treatment Notes Wound #3 (Right Calcaneus) 1. Cleansed with: Clean wound with Normal Saline 2. Anesthetic Topical Lidocaine 4% cream to wound bed prior to debridement 4. Dressing Applied: Aquacel Ag 5. Secondary Dressing Applied ABD Pad 7. Secured with Tape Notes drawtex to heel Electronic Signature(s) Signed: 12/20/2016 1:35:52 PM By: Alejandro MullingPinkerton, Debra Entered By: Alejandro MullingPinkerton, Debra on 12/16/2016 14:48:03 Schreckengost, Enzo VMarland Kitchen.  ([id number]) -------------------------------------------------------------------------------- Wound Assessment Details Patient Name: [name] OttoLINDLEY, Lebaron V. Date of Service: 12/16/2016 2:30 PM Medical Record Number: [id number] Patient Account Number: [id number] Date of Birth/Sex: 1946/08/17 79(70 y.o. Male) Treating RN: Phillis HaggisPinkerton, Debi Primary Care Lillion Elbert: Oretha MilchSMITH, [name] Other Clinician: Referring Numa Heatwole: Oretha MilchSMITH, [name] Treating Rejeana Fadness/Extender: Rudene ReBritto, Errol Weeks in Treatment: 50 Wound Status Wound Number: 9 Primary Diabetic Wound/Ulcer of the Lower Etiology: Extremity Wound Location: Left Lower Leg - Circumfernential Secondary Venous Leg Ulcer Etiology: Wounding Event: Gradually Appeared Wound Open Date Acquired: 08/09/2016 Status: Weeks Of Treatment: 17 Comorbid Cataracts, Chronic Obstructive Clustered Wound: Yes History: Pulmonary Disease (COPD), Sleep Apnea, Angina, Congestive Heart Failure, Hypertension, Type II Diabetes, Gout, Osteoarthritis, Neuropathy Photos Photo Uploaded By: Alejandro MullingPinkerton, Debra on 12/16/2016 14:52:33 Wound Measurements Length: (cm) 19 Width: (cm) 42 Depth: (cm) 0.1 Area: (cm) 626.748 Volume: (cm) 62.675 % Reduction in Area: -525.9% % Reduction in Volume: -525.9% Epithelialization: None  Tunneling: No Undermining: No Wound Description Classification: Grade 2 Wound Margin: Flat and Intact Exudate Amount: Large Exudate Type: Serosanguineous Exudate Color: red, brown Husband, Damon V. ([id number]) Foul Odor After Cleansing: No Wound Bed Granulation Amount: Medium (34-66%) Exposed Structure Granulation Quality: Pink Fascia Exposed: No Necrotic Amount: Medium (34-66%) Fat Layer (Subcutaneous Tissue) Exposed: No Necrotic Quality: Adherent Slough Tendon Exposed: No Muscle Exposed: No Joint Exposed: No Bone Exposed: No Limited to Skin Breakdown Periwound Skin Texture Texture Color No Abnormalities Noted: No No Abnormalities Noted: No Callus:  No Atrophie Blanche: No Crepitus: No Cyanosis: No Excoriation: No Ecchymosis: No Induration: No Erythema: No Rash: No Hemosiderin Staining: No Scarring: No Mottled: Yes Pallor: No Moisture Rubor: No No Abnormalities Noted: No Dry / Scaly: No Temperature / Pain Maceration: Yes Tenderness on Palpation: Yes Wound Preparation Ulcer Cleansing: Other: soap and water, Topical Anesthetic Applied: None Treatment Notes Wound #9 (Left, Circumferential Lower Leg) 1. Cleansed with: Clean wound with Normal Saline 2. Anesthetic Topical Lidocaine 4% cream to wound bed prior to debridement 4. Dressing Applied: Aquacel Ag 5. Secondary Dressing Applied ABD Pad 7. Secured with Tape 3 Layer Compression System - Bilateral Notes drawtex Electronic Signature(s) Signed: 12/20/2016 1:35:52 PM By: Evette Georges, Hawkins V. ([id number]) Entered By: Alejandro Mulling on 12/16/2016 14:47:07 [name] ([id number]) -------------------------------------------------------------------------------- Vitals Details Patient Name: [name] Date of Service: 12/16/2016 2:30 PM Medical Record Number: [id number] Patient Account Number: [id number] Date of Birth/Sex: 04-16-1946 (71 y.o. Male) Treating RN: Phillis Haggis Primary Care Maxene Byington: Oretha Milch Other Clinician: Referring Elfreda Blanchet: Oretha Milch Treating Hailea Eaglin/Extender: Rudene Re in Treatment: 50 Vital Signs Time Taken: 14:33 Temperature (F): 97.9 Height (in): 70 Pulse (bpm): 83 Weight (lbs): 235 Respiratory Rate (breaths/min): 18 Body Mass Index (BMI): 33.7 Blood Pressure (mmHg): 129/99 Reference Range: 80 - 120 mg / dl Electronic Signature(s) Signed: 12/20/2016 1:35:52 PM By: Alejandro Mulling Entered By: Alejandro Mulling on 12/16/2016 14:34:00

## 2016-12-21 NOTE — Progress Notes (Signed)
XAVYER, STEENSON ([id number]) Visit Report for 12/16/2016 Chief Complaint Document Details Patient Name: [name], [name] Date of Service: 12/16/2016 2:30 PM Medical Record Number: [id number] Patient Account Number: [id number] Date of Birth/Sex: 1946-03-10 (71 y.o. Male) Treating RN: Phillis Haggis Primary Care Provider: Oretha Milch Other Clinician: Referring Provider: Oretha Milch Treating Provider/Extender: Rudene Re in Treatment: 50 Information Obtained from: Patient Chief Complaint Mr. Matton presents for follow-up on his right heel pressure ulcer and bilateral lower extremity lymphedema and venous ulcerations. Electronic Signature(s) Signed: 12/16/2016 3:15:27 PM By: Evlyn Kanner MD, FACS Entered By: Evlyn Kanner on 12/16/2016 15:15:27 [name] ([id number]) -------------------------------------------------------------------------------- Debridement Details Patient Name: [name] Date of Service: 12/16/2016 2:30 PM Medical Record Number: [id number] Patient Account Number: [id number] Date of Birth/Sex: [birth year] (71 y.o. Male) Treating RN: Phillis Haggis Primary Care Provider: Oretha Milch Other Clinician: Referring Provider: Oretha Milch Treating Provider/Extender: Rudene Re in Treatment: 50 Debridement Performed for Wound #3 Right Calcaneus Assessment: Performed By: Physician Evlyn Kanner, MD Debridement: Debridement Pre-procedure Yes - 14:57 Verification/Time Out Taken: Start Time: 14:58 Pain Control: Lidocaine 4% Topical Solution Level: Skin/Subcutaneous Tissue Total Area Debrided (L x 0.3 (cm) x 0.7 (cm) = 0.21 (cm) W): Tissue and other Viable, Non-Viable, Exudate, Fibrin/Slough, Subcutaneous material debrided: Instrument: Curette Bleeding: Minimum Hemostasis Achieved: Pressure End Time: 15:00 Procedural Pain: 0 Post Procedural Pain: 0 Response to Treatment: Procedure was tolerated well Post Debridement Measurements of  Total Wound Length: (cm) 0.3 Stage: Category/Stage III Width: (cm) 0.7 Depth: (cm) 0.7 Volume: (cm) 0.115 Character of Wound/Ulcer Post Requires Further Debridement: Debridement Severity of Tissue Post Fat layer exposed Debridement: Post Procedure Diagnosis Same as Pre-procedure Electronic Signature(s) Signed: 12/16/2016 3:15:16 PM By: Evlyn Kanner MD, FACS Signed: 12/20/2016 1:35:52 PM By: Alejandro Mulling Entered By: Evlyn Kanner on 12/16/2016 15:15:16 Roemmich, Jlon V. ([id number]) [name], Farris V. ([id number]) -------------------------------------------------------------------------------- HPI Details Patient Name: [name]. Date of Service: 12/16/2016 2:30 PM Medical Record Number: [id number] Patient Account Number: [id number] Date of Birth/Sex: 10/31/1946 (71 y.o. Male) Treating RN: Phillis Haggis Primary Care Provider: Oretha Milch Other Clinician: Referring Provider: Oretha Milch Treating Provider/Extender: Rudene Re in Treatment: 50 History of Present Illness Location: ulcerated area on the right heel, left gluteal region and thigh and then bilateral lower extremities Quality: Patient reports experiencing a sharp pain to affected area(s). Severity: Patient states wound are getting worse. Duration: Patient has had the wound for > 12 months prior to seeking treatment at the wound center Timing: Pain in wound is constant (hurts all the time) Context: The wound appeared gradually over time Modifying Factors: Other treatment(s) tried include: he sees his heart doctor and his primary care doctor Associated Signs and Symptoms: patient has not been able to walk for over a year now HPI Description: 71 year old gentleman with a known history of hypertension, diabetes, obstructive sleep apnea, COPD, diastolic CHF, coronary artery disease was admitted to the hospital with sepsis from an ulcer of the right heel and was treated there in October 2016. he also has  chronic bilateral lower extremity edema and lymphedema. He had received vancomycin, Zosyn and at that stage and x-rays showed hardware in the right ankle but no evidence of osteomyelitis. he was a former smoker. he was also treated with Augmentin orally for 10 days. He is either bedbound or wheelchair-bound and does not ambulate by himself. 01/19/2016 -- he has not been seen here for 3 weeks and this was because he was admitted to [facility] between 223 and 01/08/2016 for sepsis, UTI and pneumonia involving the left lung. he was treated with IV vancomycin and Zosyn and then meropenem. Was discharged home on oral Bactrim for 2 weeks none of his vascular test or x-rays were done and we will reorder these. 01/26/2016 -- he has not yet done the x-ray of his foot and is vascular tests are still pending. I have asked him to work on these with his nursing home staff. Addendum: he has got an x-ray of the right foot done which shows that his osteopenia but no specific ostial lysis or abnormal periosteal reaction. Final impression was degenerative changes with dorsal foot soft tissue swelling. 02/16/2016 -- lower extremity arterial duplex examination shows a 50-99% stenosis of the right tibioperoneal trunk. He had biphasic flow in the right SFA, popliteal and tibioperoneal trunk. Left-sided he had triphasic flow throughout 02/29/2016 -- he is awaiting his vascular opinion with Dr. Gilda Crease which is to be done on April 24 03/08/2016 -- he has not kept his appointment with Dr. Gilda Crease on April 24 and does not seem to know what happened about this. it's difficult to gauge whether he is in full control of his mental faculties as at times he is extremely rude to the nursing staff. 03/22/2016 -- the patient was seen by Dr. 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  Recommended that the patient had severe atherosclerotic changes of both lower extremities associated with ulceration and tissue loss of the foot. This is a limb threatening ischemia and the patient was recommended to undergo Olsen, Boleslaus V. ([id number]) angiography of the lower extremity with a hope for intervention for limb salvage. Patient agreed and will proceed to angiography. He was admitted to the hospital between May 2 and 03/15/2016 - he underwent induction of a catheter into his right lower extremity and third order catheter placement with contrast injection to the right lower extremity for distal runoff. Percutaneous transluminal angioplasty of the right superficial femoral and popliteal arteries were done. He also had a right peroneal angioplasty. Patient had a postoperative hematoma and was admitted for observation and in the next 24 hours he was very agitated and combative and had to be seen by psychiatric. He had a follow-up with Dr. Gilda Crease in 3 weeks. 04/18/2016 -- notes reviewed from the vascular office where he was seen for his postop visit after the PTA of the right SFA and popliteal arteries on 03/12/2016. He underwent an ABI which showed right ABI to be more than 1.3 and left to be more than 1 more than 1.3, great toe and PPG waveforms are decreased bilaterally. No additional intervention indicated at this time and the patient was to follow-up in 3 months with an ABI and bilateral lower extremity duplex study. 05/02/2016 -- he complains that his compression stockings are causing him a lot of discomfort and he is not happy wearing them. 05/30/2016 -- the swelling of both legs has increased again and he has had blisters which have opened out into ulcerations. 09/12/2016 -- the lymphedema pumps are still not delivered and hopefully will be done within this week. As far as his skin substitute goes we are still awaiting a response from his insurance 10-10-2016: Mr. Joswick  presents today and states that his left edema pumps have arrived but the he has only received treatment "5 times" since arrival. He also denies using the offloading  boot for the right heel but admits to having the boot. He states that staff is hesitant to assist and application of the lymphedema pumps. 10/17/2016 -- I understand he continues to take his diuretics and has been using his lymphedema pump once a day for about an hour. He cannot do it twice a day. 10-31-16 Mr. Franek presents today for follow-up on his bilateral lower extremity venous ulcers and his right posterior heel pressure ulcer. He states that he continues to do his lymphedema pumps once daily, unable to get assistance for twice daily. he does admit to increased drainage to the left leg more than the right leg. He denies any pain, fever, chills or overall ill feeling. 11/08/2016 -- the skin substitute which was run through his insurance company has a high copayment and he is not agreeable about getting this. 11/22/2016 -- he is still not able to use his lymphedema pumps as requested due to lack of help at his living facility. Electronic Signature(s) Signed: 12/16/2016 3:15:40 PM By: Evlyn Kanner MD, FACS Entered By: Evlyn Kanner on 12/16/2016 15:15:40 [name] ([id number]) -------------------------------------------------------------------------------- Physical Exam Details Patient Name: [name]. Date of Service: 12/16/2016 2:30 PM Medical Record Number: [id number] Patient Account Number: [id number] Date of Birth/Sex: 12-02-1945 (71 y.o. Male) Treating RN: Phillis Haggis Primary Care Provider: Oretha Milch Other Clinician: Referring Provider: Oretha Milch Treating Provider/Extender: Rudene Re in Treatment: 50 Constitutional . Pulse regular. Respirations normal and unlabored. Afebrile. . Eyes Nonicteric. Reactive to light. Ears, Nose, Mouth, and Throat Lips, teeth, and gums WNL.Marland Kitchen Moist  mucosa without lesions. Neck supple and nontender. No palpable supraclavicular or cervical adenopathy. Normal sized without goiter. Respiratory WNL. No retractions.. Breath sounds WNL, No rubs, rales, rhonchi, or wheeze.. Cardiovascular Heart rhythm and rate regular, no murmur or gallop.. Pedal Pulses WNL. No clubbing, cyanosis or edema. Lymphatic No adneopathy. No adenopathy. No adenopathy. Musculoskeletal Adexa without tenderness or enlargement.. Digits and nails w/o clubbing, cyanosis, infection, petechiae, ischemia, or inflammatory conditions.. Integumentary (Hair, Skin) No suspicious lesions. No crepitus or fluctuance. No peri-wound warmth or erythema. No masses.Marland Kitchen Psychiatric Judgement and insight Intact.. No evidence of depression, anxiety, or agitation.. Notes the lymphedema has gone up significantly and he is weeping a lot and due to this there is maceration of his right heel wound and I have sharply debrided this with a curette and bleeding controlled with pressure Electronic Signature(s) Signed: 12/16/2016 3:16:19 PM By: Evlyn Kanner MD, FACS Entered By: Evlyn Kanner on 12/16/2016 15:16:18 [name] ([id number]) -------------------------------------------------------------------------------- Physician Orders Details Patient Name: [name]. Date of Service: 12/16/2016 2:30 PM Medical Record Number: [id number] Patient Account Number: [id number] Date of Birth/Sex: 1946/03/26 (71 y.o. Male) Treating RN: Phillis Haggis Primary Care Provider: Oretha Milch Other Clinician: Referring Provider: Oretha Milch Treating Provider/Extender: Rudene Re in Treatment: 35 Verbal / Phone Orders: Yes Clinician: Ashok Cordia, Debi Read Back and Verified: Yes Diagnosis Coding Wound Cleansing Wound #10 Right,Anterior Lower Leg o Clean wound with Normal Saline. - at clinic o Cleanse wound with mild soap and water - at clinic o No tub bath. - only sink bath or bed  bath Wound #3 Right Calcaneus o Clean wound with Normal Saline. - at clinic o Cleanse wound with mild soap and water - at clinic o No tub bath. - only sink bath or bed bath Wound #9 Left,Circumferential Lower Leg o Clean wound with Normal Saline. - at clinic o Cleanse wound with mild soap and water - at clinic [facility]  o No tub bath. - only sink bath or bed bath Anesthetic Wound #10 Right,Anterior Lower Leg o Topical Lidocaine 4% cream applied to wound bed prior to debridement - for clinic use only Wound #3 Right Calcaneus o Topical Lidocaine 4% cream applied to wound bed prior to debridement - for clinic use only Wound #9 Left,Circumferential Lower Leg o Topical Lidocaine 4% cream applied to wound bed prior to debridement - for clinic use only Skin Barriers/Peri-Wound Care Wound #10 Right,Anterior Lower Leg o Antifungal cream - Nystatin o Triamcinolone Acetonide Ointment Wound #3 Right Calcaneus o Antifungal cream - Nystatin o Triamcinolone Acetonide Ointment Wound #9 Left,Circumferential Lower Leg o Antifungal cream - Nystatin Appelhans, Harvis V. ([id number]) o Triamcinolone Acetonide Ointment Primary Wound Dressing Wound #10 Right,Anterior Lower Leg o Aquacel Ag Wound #3 Right Calcaneus o Aquacel Ag Wound #9 Left,Circumferential Lower Leg o Aquacel Ag Secondary Dressing Wound #10 Right,Anterior Lower Leg o ABD pad o XtraSorb Wound #3 Right Calcaneus o ABD pad o Dry Gauze o Drawtex Wound #9 Left,Circumferential Lower Leg o ABD pad o XtraSorb Dressing Change Frequency Wound #10 Right,Anterior Lower Leg o Change dressing every week o Other: - SNF to change bilateral leg wrap if the drainage becomes visible but to do take off the heel dressing. Wound #3 Right Calcaneus o Change dressing every week - SNF to change bilateral leg wrap if the drainage becomes visible but to do take off the heel dressing. Wound #9  Left,Circumferential Lower Leg o Change dressing every week Follow-up Appointments Wound #10 Right,Anterior Lower Leg o Return Appointment in 1 week. Wound #3 Right Calcaneus o Return Appointment in 1 week. Wound #9 Left,Circumferential Lower Leg o Return Appointment in 1 week. OLUWASEUN, CREMER V. ([id number]) Edema Control Wound #10 Right,Anterior Lower Leg o 3 Layer Compression System - Bilateral o Compression Pump: Use compression pump on left lower extremity for 30 minutes, twice daily. o Compression Pump: Use compression pump on right lower extremity for 30 minutes, twice daily. o Other: - unna to anchor Wound #3 Right Calcaneus o 3 Layer Compression System - Bilateral o Compression Pump: Use compression pump on left lower extremity for 30 minutes, twice daily. o Compression Pump: Use compression pump on right lower extremity for 30 minutes, twice daily. o Other: - unna to anchor Wound #9 Left,Circumferential Lower Leg o 3 Layer Compression System - Bilateral o Compression Pump: Use compression pump on left lower extremity for 30 minutes, twice daily. o Compression Pump: Use compression pump on right lower extremity for 30 minutes, twice daily. o Other: - unna to anchor Off-Loading Wound #10 Right,Anterior Lower Leg o Turn and reposition every 2 hours o Other: - sage boots at night and float heel when lying in bed *******lymphedema pumps to be placed on pt twice a day for one hour sessions******** Wound #3 Right Calcaneus o Turn and reposition every 2 hours o Other: - sage boots at night and float heel when lying in bed *******lymphedema pumps to be placed on pt twice a day for one hour sessions******** Wound #9 Left,Circumferential Lower Leg o Turn and reposition every 2 hours o Other: - sage boots at night and float heel when lying in bed *******lymphedema pumps to be placed on pt twice a day for one hour  sessions******** Additional Orders / Instructions Wound #10 Right,Anterior Lower Leg o Increase protein intake. Mood, Milan V. ([id number]) Wound #3 Right Calcaneus o Increase protein intake. Wound #9 Left,Circumferential Lower Leg o Increase protein intake. Medications-please add  to medication list. Wound #10 Right,Anterior Lower Leg o Other: - Vitamin C, Vitamin A, Zinc, multivitamin Wound #3 Right Calcaneus o Other: - Vitamin C, Vitamin A, Zinc, multivitamin Wound #9 Left,Circumferential Lower Leg o Other: - Vitamin C, Vitamin A, Zinc, multivitamin Electronic Signature(s) Signed: 12/16/2016 4:07:26 PM By: Evlyn Kanner MD, FACS Signed: 12/20/2016 1:35:52 PM By: Alejandro Mulling Entered By: Alejandro Mulling on 12/16/2016 15:01:17 [name] ([id number]) -------------------------------------------------------------------------------- Problem List Details Patient Name: [name]. Date of Service: 12/16/2016 2:30 PM Medical Record Number: [id number] Patient Account Number: [id number] Date of Birth/Sex: [birth year] (71 y.o. Male) Treating RN: Phillis Haggis Primary Care Provider: Oretha Milch Other Clinician: Referring Provider: Oretha Milch Treating Provider/Extender: Rudene Re in Treatment: 68 Active Problems ICD-10 Encounter Code Description Active Date Diagnosis E11.621 Type 2 diabetes mellitus with foot ulcer 01/01/2016 Yes L89.613 Pressure ulcer of right heel, stage 3 01/01/2016 Yes I89.0 Lymphedema, not elsewhere classified 01/01/2016 Yes E66.01 Morbid (severe) obesity due to excess calories 01/01/2016 Yes M70.871 Other soft tissue disorders related to use, overuse and 01/19/2016 Yes pressure, right ankle and foot 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/16/2016 Yes ulceration of heel and midfoot Inactive Problems Resolved Problems ICD-10 Code Description Active Date Resolved Date L89.322 Pressure ulcer of left buttock,  stage 2 01/01/2016 01/01/2016 Electronic Signature(s) Signed: 12/16/2016 3:14:45 PM By: Evlyn Kanner MD, FACS Previous Signature: 12/16/2016 3:14:19 PM Version By: Evlyn Kanner MD, FACS Previous Signature: 12/16/2016 3:13:10 PM Version By: Evlyn Kanner MD, FACS [name], Levonte V. ([id number]) Entered By: Evlyn Kanner on 12/16/2016 15:14:45 [name] ([id number]) -------------------------------------------------------------------------------- Progress Note Details Patient Name: [name]. Date of Service: 12/16/2016 2:30 PM Medical Record Number: [id number] Patient Account Number: [id number] Date of Birth/Sex: 11/29/1945 (71 y.o. Male) Treating RN: Phillis Haggis Primary Care Provider: Oretha Milch Other Clinician: Referring Provider: Oretha Milch Treating Provider/Extender: Rudene Re in Treatment: 50 Subjective Chief Complaint Information obtained from Patient Mr. Dahlstrom presents for follow-up on his right heel pressure ulcer and bilateral lower extremity lymphedema and venous ulcerations. History of Present Illness (HPI) The following HPI elements were documented for the patient's wound: Location: ulcerated area on the right heel, left gluteal region and thigh and then bilateral lower extremities Quality: Patient reports experiencing a sharp pain to affected area(s). Severity: Patient states wound are getting worse. Duration: Patient has had the wound for > 12 months prior to seeking treatment at the wound center Timing: Pain in wound is constant (hurts all the time) Context: The wound appeared gradually over time Modifying Factors: Other treatment(s) tried include: he sees his heart doctor and his primary care doctor Associated Signs and Symptoms: patient has not been able to walk for over a year now 71 year old gentleman with a known history of hypertension, diabetes, obstructive sleep apnea, COPD, diastolic CHF, coronary artery disease was admitted to the  hospital with sepsis from an ulcer of the right heel and was treated there in October 2016. he also has chronic bilateral lower extremity edema and lymphedema. He had received vancomycin, Zosyn and at that stage and x-rays showed hardware in the right ankle but no evidence of osteomyelitis. he was a former smoker. he was also treated with Augmentin orally for 10 days. He is either bedbound or wheelchair-bound and does not ambulate by himself. 01/19/2016 -- he has not been seen here for 3 weeks and this was because he was admitted to [facility] between 223 and 01/08/2016 for sepsis, UTI and  pneumonia involving the left lung. he was treated with IV vancomycin and Zosyn and then meropenem. Was discharged home on oral Bactrim for 2 weeks none of his vascular test or x-rays were done and we will reorder these. 01/26/2016 -- he has not yet done the x-ray of his foot and is vascular tests are still pending. I have asked him to work on these with his nursing home staff. Addendum: he has got an x-ray of the right foot done which shows that his osteopenia but no specific ostial lysis or abnormal periosteal reaction. Final impression was degenerative changes with dorsal foot soft tissue swelling. 02/16/2016 -- lower extremity arterial duplex examination shows a 50-99% stenosis of the right tibioperoneal trunk. He had biphasic flow in the right SFA, popliteal and tibioperoneal trunk. Left-sided he had triphasic flow throughout DARRIUS, MONTANO ([id number]) 02/29/2016 -- he is awaiting his vascular opinion with Dr. Gilda Crease which is to be done on April 24 03/08/2016 -- he has not kept his appointment with Dr. Gilda Crease on April 24 and does not seem to know what happened about this. it's difficult to gauge whether he is in full control of his mental faculties as at times he is extremely rude to the nursing staff. 03/22/2016 -- the patient was seen by Dr. 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 Recommended that the patient had severe atherosclerotic changes of both lower extremities associated with ulceration and tissue loss of the foot. This is a limb threatening ischemia and the patient was recommended to undergo angiography of the lower extremity with a hope for intervention for limb salvage. Patient agreed and will proceed to angiography. He was admitted to the hospital between May 2 and 03/15/2016 - he underwent induction of a catheter into his right lower extremity and third order catheter placement with contrast injection to the right lower extremity for distal runoff. Percutaneous transluminal angioplasty of the right superficial femoral and popliteal arteries were done. He also had a right peroneal angioplasty. Patient had a postoperative hematoma and was admitted for observation and in the next 24 hours he was very agitated and combative and had to be seen by psychiatric. He had a follow-up with Dr. Gilda Crease in 3 weeks. 04/18/2016 -- notes reviewed from the vascular office where he was seen for his postop visit after the PTA of the right SFA and popliteal arteries on 03/12/2016. He underwent an ABI which showed right ABI to be more than 1.3 and left to be more than 1 more than 1.3, great toe and PPG waveforms are decreased bilaterally. No additional intervention indicated at this time and the patient was to follow-up in 3 months with an ABI and bilateral lower extremity duplex study. 05/02/2016 -- he complains that his compression stockings are causing him a lot of discomfort and he is not happy wearing them. 05/30/2016 -- the swelling of both legs has increased again and he has had blisters which have opened out into ulcerations. 09/12/2016 -- the lymphedema pumps are still not delivered and hopefully will be done within this week. As far as his skin  substitute goes we are still awaiting a response from his insurance 10-10-2016: Mr. Schirtzinger presents today and states that his left edema pumps have arrived but the he has only received treatment "5 times" since arrival. He also denies using the offloading boot for the right heel but admits to having  the boot. He states that staff is hesitant to assist and application of the lymphedema pumps. 10/17/2016 -- I understand he continues to take his diuretics and has been using his lymphedema pump once a day for about an hour. He cannot do it twice a day. 10-31-16 Mr. Prust presents today for follow-up on his bilateral lower extremity venous ulcers and his right posterior heel pressure ulcer. He states that he continues to do his lymphedema pumps once daily, unable to get assistance for twice daily. he does admit to increased drainage to the left leg more than the right leg. He denies any pain, fever, chills or overall ill feeling. 11/08/2016 -- the skin substitute which was run through his insurance company has a high copayment and he is not agreeable about getting this. 11/22/2016 -- he is still not able to use his lymphedema pumps as requested due to lack of help at his living facility. Renfroe, Arlow V. ([id number]) Objective Constitutional Pulse regular. Respirations normal and unlabored. Afebrile. Vitals Time Taken: 2:33 PM, Height: 70 in, Weight: 235 lbs, BMI: 33.7, Temperature: 97.9 F, Pulse: 83 bpm, Respiratory Rate: 18 breaths/min, Blood Pressure: 129/99 mmHg. Eyes Nonicteric. Reactive to light. Ears, Nose, Mouth, and Throat Lips, teeth, and gums WNL.Marland Kitchen Moist mucosa without lesions. Neck supple and nontender. No palpable supraclavicular or cervical adenopathy. Normal sized without goiter. Respiratory WNL. No retractions.. Breath sounds WNL, No rubs, rales, rhonchi, or wheeze.. Cardiovascular Heart rhythm and rate regular, no murmur or gallop.. Pedal Pulses WNL. No clubbing,  cyanosis or edema. Lymphatic No adneopathy. No adenopathy. No adenopathy. Musculoskeletal Adexa without tenderness or enlargement.. Digits and nails w/o clubbing, cyanosis, infection, petechiae, ischemia, or inflammatory conditions.Marland Kitchen Psychiatric Judgement and insight Intact.. No evidence of depression, anxiety, or agitation.. General Notes: the lymphedema has gone up significantly and he is weeping a lot and due to this there is maceration of his right heel wound and I have sharply debrided this with a curette and bleeding controlled with pressure Integumentary (Hair, Skin) No suspicious lesions. No crepitus or fluctuance. No peri-wound warmth or erythema. No masses.. Wound #10 status is Open. Original cause of wound was Blister. The wound is located on the Right,Anterior Lower Leg. The wound measures 17cm length x 22cm width x 0.1cm depth; 293.739cm^2 area and 29.374cm^3 volume. The wound is limited to skin breakdown. There is no tunneling noted. There is a large amount of serosanguineous drainage noted. The wound margin is flat and intact. There is large (67-100%) red granulation within the wound bed. There is a small (1-33%) amount of necrotic tissue within the wound Pricer, Trebor V. ([id number]) bed including Adherent Slough. The periwound skin appearance did not exhibit: Callus, Crepitus, Excoriation, Induration, Rash, Scarring, Dry/Scaly, Maceration, Atrophie Blanche, Cyanosis, Ecchymosis, Hemosiderin Staining, Mottled, Pallor, Rubor, Erythema. Periwound temperature was noted as No Abnormality. The periwound has tenderness on palpation. Wound #3 status is Open. Original cause of wound was Pressure Injury. The wound is located on the Right Calcaneus. The wound measures 0.3cm length x 0.7cm width x 0.2cm depth; 0.165cm^2 area and 0.033cm^3 volume. The wound is limited to skin breakdown. There is no tunneling or undermining noted. There is a large amount of serous drainage noted. The  wound margin is flat and intact. There is medium (34-66%) pink granulation within the wound bed. There is a medium (34-66%) amount of necrotic tissue within the wound bed including Adherent Slough. The periwound skin appearance exhibited: Maceration. The periwound skin appearance did not exhibit: Callus, Crepitus, Excoriation, Induration,  Rash, Scarring, Dry/Scaly, Atrophie Blanche, Cyanosis, Ecchymosis, Hemosiderin Staining, Mottled, Pallor, Rubor, Erythema. The periwound has tenderness on palpation. Wound #9 status is Open. Original cause of wound was Gradually Appeared. The wound is located on the Left,Circumferential Lower Leg. The wound measures 19cm length x 42cm width x 0.1cm depth; 626.748cm^2 area and 62.675cm^3 volume. The wound is limited to skin breakdown. There is no tunneling or undermining noted. There is a large amount of serosanguineous drainage noted. The wound margin is flat and intact. There is medium (34-66%) pink granulation within the wound bed. There is a medium (34- 66%) amount of necrotic tissue within the wound bed including Adherent Slough. The periwound skin appearance exhibited: Maceration, Mottled. The periwound skin appearance did not exhibit: Callus, Crepitus, Excoriation, Induration, Rash, Scarring, Dry/Scaly, Atrophie Blanche, Cyanosis, Ecchymosis, Hemosiderin Staining, Pallor, Rubor, Erythema. The periwound has tenderness on palpation. 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 There was a  Skin/Subcutaneous Tissue Debridement ([id number]) debridement with total area of 0.21 sq cm performed by Evlyn Kanner, MD. with [name] ([id number]) the following instrument(s): Curette to remove Viable and Non-Viable tissue/material including Exudate, Fibrin/Slough, and Subcutaneous after achieving pain control using Lidocaine 4% Topical Solution. A time out was conducted at 14:57, prior to the start of the procedure. A Minimum amount of bleeding was controlled with Pressure. The procedure was tolerated well with a pain level of 0 throughout and a pain level of 0 following the procedure. Post Debridement Measurements: 0.3cm length x 0.7cm width x 0.7cm depth; 0.115cm^3 volume. Post debridement Stage noted as Category/Stage III. Character of Wound/Ulcer Post Debridement requires further debridement. Severity of Tissue Post Debridement is: Fat layer exposed. Post procedure Diagnosis Wound #3: Same as Pre-Procedure Plan Wound Cleansing: Wound #10 Right,Anterior Lower Leg: Clean wound with Normal Saline. - at clinic Cleanse wound with mild soap and water - at clinic No tub bath. - only sink bath or bed bath Wound #3 Right Calcaneus: Clean wound with Normal Saline. - at clinic Cleanse wound with mild soap and water - at clinic No tub bath. - only sink bath or bed bath Wound #9 Left,Circumferential Lower Leg: Clean wound with Normal Saline. - at clinic Cleanse wound with mild soap and water - at clinic No tub bath. - only sink bath or bed bath Anesthetic: Wound #10 Right,Anterior Lower Leg: Topical Lidocaine 4% cream applied to wound bed prior to debridement - for clinic use only Wound #3 Right Calcaneus: Topical Lidocaine 4% cream applied to wound bed prior to debridement - for clinic use only Wound #9 Left,Circumferential Lower Leg: Topical Lidocaine 4% cream applied to wound bed prior to debridement - for clinic use only Skin Barriers/Peri-Wound Care: Wound #10  Right,Anterior Lower Leg: Antifungal cream - Nystatin Triamcinolone Acetonide Ointment Wound #3 Right Calcaneus: Antifungal cream - Nystatin Triamcinolone Acetonide Ointment Wound #9 Left,Circumferential Lower Leg: Antifungal cream - Nystatin Triamcinolone Acetonide Ointment Primary Wound Dressing: Wound #10 Right,Anterior Lower Leg: Aquacel Ag Blust, Mekiah V. ([id number]) Wound #3 Right Calcaneus: Aquacel Ag Wound #9 Left,Circumferential Lower Leg: Aquacel  Ag Secondary Dressing: Wound #10 Right,Anterior Lower Leg: ABD pad XtraSorb Wound #3 Right Calcaneus: ABD pad Dry Gauze Drawtex Wound #9 Left,Circumferential Lower Leg: ABD pad XtraSorb Dressing Change Frequency: Wound #10 Right,Anterior Lower Leg: Change dressing every week Other: - SNF to change bilateral leg wrap if the drainage becomes visible but to do take off the heel dressing. Wound #3 Right Calcaneus: Change dressing every week - SNF to change bilateral leg wrap if the drainage becomes visible but to do take off the heel dressing. Wound #9 Left,Circumferential Lower Leg: Change dressing every week Follow-up Appointments: Wound #10 Right,Anterior Lower Leg: Return Appointment in 1 week. Wound #3 Right Calcaneus: Return Appointment in 1 week. Wound #9 Left,Circumferential Lower Leg: Return Appointment in 1 week. Edema Control: Wound #10 Right,Anterior Lower Leg: 3 Layer Compression System - Bilateral Compression Pump: Use compression pump on left lower extremity for 30 minutes, twice daily. Compression Pump: Use compression pump on right lower extremity for 30 minutes, twice daily. Other: - unna to anchor Wound #3 Right Calcaneus: 3 Layer Compression System - Bilateral Compression Pump: Use compression pump on left lower extremity for 30 minutes, twice daily. Compression Pump: Use compression pump on right lower extremity for 30 minutes, twice daily. Other: - unna to anchor Wound #9  Left,Circumferential Lower Leg: 3 Layer Compression System - Bilateral Compression Pump: Use compression pump on left lower extremity for 30 minutes, twice daily. Compression Pump: Use compression pump on right lower extremity for 30 minutes, twice daily. Other: - unna to anchor Off-Loading: Wound #10 Right,Anterior Lower Leg: Turn and reposition every 2 hours Scala, Keary V. ([id number]) Other: - sage boots at night and float heel when lying in bed *******lymphedema pumps to be placed on pt twice a day for one hour sessions******** Wound #3 Right Calcaneus: Turn and reposition every 2 hours Other: - sage boots at night and float heel when lying in bed *******lymphedema pumps to be placed on pt twice a day for one hour sessions******** Wound #9 Left,Circumferential Lower Leg: Turn and reposition every 2 hours Other: - sage boots at night and float heel when lying in bed *******lymphedema pumps to be placed on pt twice a day for one hour sessions******** Additional Orders / Instructions: Wound #10 Right,Anterior Lower Leg: Increase protein intake. Wound #3 Right Calcaneus: Increase protein intake. Wound #9 Left,Circumferential Lower Leg: Increase protein intake. Medications-please add to medication list.: Wound #10 Right,Anterior Lower Leg: Other: - Vitamin C, Vitamin A, Zinc, multivitamin Wound #3 Right Calcaneus: Other: - Vitamin C, Vitamin A, Zinc, multivitamin Wound #9 Left,Circumferential Lower Leg: Other: - Vitamin C, Vitamin A, Zinc, multivitamin I have recommended : 1. using Aquacell ag and Drawtex for his right heel to be changed every week 2. have urged him to take his diuretic regularly 3. he has received his lymphedema pumps but he is finding it difficult to get someone to help him put these on and off at his nursing home and we will attempt to talk to the nursing supervisor there. at present he is using them once a day only. 4. We will continue with silver alginate  and a 3 layer Profore. Notes It was brought to my notice today that this patient has been extremely rude and insulting to my staff nurse and she is upset and is in tears. This is not the first time the patient has done this and in the past over several weeks, I have addressed the situation, from time to time. I have personally  been insulted by him with rude comments, I have thought this may be due to an element of dementia. However at this stage, his management is beyond the scope of our practice and I will speak to his nursing supervisor and have them assessed by his PCP for behavioral disorders. We will give him noticed to find a another wound center of his choice and will discharge him from our services. Electronic Signature(s) Signed: 12/16/2016 3:51:01 PM By: Evlyn KannerBritto, Markan Cazarez MD, FACS Ginette OttoLINDLEY, Trestin V. ([id number]) Previous Signature: 12/16/2016 3:17:17 PM Version By: Evlyn KannerBritto, Crosby Bevan MD, FACS Entered By: Evlyn KannerBritto, Lejuan Botto on 12/16/2016 15:51:00 Ginette OttoLINDLEY, Christo V. ([id number]) -------------------------------------------------------------------------------- SuperBill Details Patient Name: Ginette OttoLINDLEY, Ariv V. Date of Service: 12/16/2016 Medical Record Number: [id number] Patient Account Number: [id number] Date of Birth/Sex: [birth year] [age] y.o. Male) Treating RN: Phillis HaggisPinkerton, Debi Primary Care Provider: Oretha MilchSMITH, [name] Other Clinician: Referring Provider: Oretha MilchSMITH, [name] Treating Provider/Extender: Evlyn KannerBritto, Andreya Lacks Service Line: Outpatient Weeks in Treatment: 50 Diagnosis Coding ICD-10 Codes Code Description E11.621 Type 2 diabetes mellitus with foot ulcer L89.613 Pressure ulcer of right heel, stage 3 I89.0 Lymphedema, not elsewhere classified E66.01 Morbid (severe) obesity due to excess calories M70.871 Other soft tissue disorders related to use, overuse and pressure, right ankle and foot 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 Facility Procedures CPT4 Code:  [id number] Description: 11042 - DEB SUBQ TISSUE 20 SQ CM/< ICD-10 Description Diagnosis E11.621 Type 2 diabetes mellitus with foot ulcer L89.613 Pressure ulcer of right heel, stage 3 I89.0 Lymphedema, not elsewhere classified Modifier: Quantity: 1 Physician Procedures CPT4 Code: [id number] Description: 11042 - WC PHYS SUBQ TISS 20 SQ CM ICD-10 Description Diagnosis E11.621 Type 2 diabetes mellitus with foot ulcer L89.613 Pressure ulcer of right heel, stage 3 I89.0 Lymphedema, not elsewhere classified Modifier: Quantity: 1 Electronic Signature(s) Signed: 12/16/2016 3:17:34 PM By: Evlyn KannerBritto, Coren Crownover MD, FACS Entered By: Evlyn KannerBritto, Garcia Dalzell on 12/16/2016 15:17:32

## 2016-12-23 ENCOUNTER — Encounter: Payer: Medicare Other | Admitting: Surgery

## 2016-12-23 DIAGNOSIS — E11621 Type 2 diabetes mellitus with foot ulcer: Secondary | ICD-10-CM | POA: Diagnosis not present

## 2016-12-23 NOTE — Progress Notes (Addendum)
ESVIN, HNAT ([id number]) Visit Report for 12/23/2016 Chief Complaint Document Details Patient Name: [name], [name] Date of Service: 12/23/2016 1:30 PM Medical Record Number: [id number] Patient Account Number: [id number] Date of Birth/Sex: February 07, 1946 (71 y.o. Male) Treating RN: [name] Primary Care Provider: Oretha Milch Other Clinician: Referring Provider: Oretha Milch Treating Provider/Extender: Rudene Re in Treatment: 83 Information Obtained from: Patient Chief Complaint [name] presents for follow-up on his right heel pressure ulcer and bilateral lower extremity lymphedema and venous ulcerations. Electronic Signature(s) Signed: 12/23/2016 2:05:36 PM By: Evlyn Kanner MD, FACS Entered By: Evlyn Kanner on 12/23/2016 14:05:36 [name] ([id number]) -------------------------------------------------------------------------------- Debridement Details Patient Name: [name] Date of Service: 12/23/2016 1:30 PM Medical Record Number: [id number] Patient Account Number: [id number] Date of Birth/Sex: 1946/08/16 (71 y.o. Male) Treating RN: [name] Primary Care Provider: Oretha Milch Other Clinician: Referring Provider: Oretha Milch Treating Provider/Extender: Rudene Re in Treatment: 21 Debridement Performed for Wound #3 Right Calcaneus Assessment: Performed By: Physician Evlyn Kanner, MD Debridement: Debridement Pre-procedure Yes - 13:59 Verification/Time Out Taken: Start Time: 13:59 Pain Control: Lidocaine 4% Topical Solution Level: Skin/Subcutaneous Tissue Total Area Debrided (L x 0.7 (cm) x 1 (cm) = 0.7 (cm) W): Tissue and other Viable, Non-Viable, Fibrin/Slough, Subcutaneous material debrided: Instrument: Curette Bleeding: Minimum Hemostasis Achieved: Pressure End Time: 14:02 Procedural Pain: 0 Post Procedural Pain: 0 Response to Treatment: Procedure was tolerated well Post Debridement Measurements of Total  Wound Length: (cm) 0.7 Stage: Category/Stage III Width: (cm) 1 Depth: (cm) 0.2 Volume: (cm) 0.11 Character of Wound/Ulcer Post Improved Debridement: Severity of Tissue Post Fat layer exposed Debridement: Post Procedure Diagnosis Same as Pre-procedure Electronic Signature(s) Signed: 12/23/2016 2:05:27 PM By: Evlyn Kanner MD, FACS Signed: 12/23/2016 2:54:30 PM By: [name] Entered By: Evlyn Kanner on 12/23/2016 14:05:27 [name] ([id number]) [name], [name] Kitchen ([id number]) -------------------------------------------------------------------------------- HPI Details Patient Name: [name] Date of Service: 12/23/2016 1:30 PM Medical Record Number: [id number] Patient Account Number: [id number] Date of Birth/Sex: 1946/09/19 (71 y.o. Male) Treating RN: [name] Primary Care Provider: Oretha Milch Other Clinician: Referring Provider: Oretha Milch Treating Provider/Extender: Rudene Re in Treatment: 64 History of Present Illness Location: ulcerated area on the right heel, left gluteal region and thigh and then bilateral lower extremities Quality: Patient reports experiencing a sharp pain to affected area(s). Severity: Patient states wound are getting worse. Duration: Patient has had the wound for > 12 months prior to seeking treatment at the wound center Timing: Pain in wound is constant (hurts all the time) Context: The wound appeared gradually over time Modifying Factors: Other treatment(s) tried include: he sees his heart doctor and his primary care doctor Associated Signs and Symptoms: patient has not been able to walk for over a year now HPI Description: 71 year old gentleman with a known history of hypertension, diabetes, obstructive sleep apnea, COPD, diastolic CHF, coronary artery disease was admitted to the hospital with sepsis from an ulcer of the right heel and was treated there in October 2016. he also has chronic bilateral lower extremity  edema and lymphedema. He had received vancomycin, Zosyn and at that stage and x-rays showed hardware in the right ankle but no evidence of osteomyelitis. he was a former smoker. he was also treated with Augmentin orally for 10 days. He is either bedbound or wheelchair-bound and does not ambulate by himself. 01/19/2016 -- he has not been seen here for 3 weeks and this was because he was admitted to [facility] between 223  and 01/08/2016 for sepsis, UTI and pneumonia involving the left lung. he was treated with IV vancomycin and Zosyn and then meropenem. Was discharged home on oral Bactrim for 2 weeks none of his vascular test or x-rays were done and we will reorder these. 01/26/2016 -- he has not yet done the x-ray of his foot and is vascular tests are still pending. I have asked him to work on these with his nursing home staff. Addendum: he has got an x-ray of the right foot done which shows that his osteopenia but no specific ostial lysis or abnormal periosteal reaction. Final impression was degenerative changes with dorsal foot soft tissue swelling. 02/16/2016 -- lower extremity arterial duplex examination shows a 50-99% stenosis of the right tibioperoneal trunk. He had biphasic flow in the right SFA, popliteal and tibioperoneal trunk. Left-sided he had triphasic flow throughout 02/29/2016 -- he is awaiting his vascular opinion with Dr. Gilda Crease which is to be done on April 24 03/08/2016 -- he has not kept his appointment with Dr. Gilda Crease on April 24 and does not seem to know what happened about this. it's difficult to gauge whether he is in full control of his mental faculties as at times he is extremely rude to the nursing staff. 03/22/2016 -- the patient was seen by Dr. 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 Recommended that the patient had severe  atherosclerotic changes of both lower extremities associated with ulceration and tissue loss of the foot. This is a limb threatening ischemia and the patient was recommended to undergo Kovalenko, [name] ([id number]) angiography of the lower extremity with a hope for intervention for limb salvage. Patient agreed and will proceed to angiography. He was admitted to the hospital between May 2 and 03/15/2016 - he underwent induction of a catheter into his right lower extremity and third order catheter placement with contrast injection to the right lower extremity for distal runoff. Percutaneous transluminal angioplasty of the right superficial femoral and popliteal arteries were done. He also had a right peroneal angioplasty. Patient had a postoperative hematoma and was admitted for observation and in the next 24 hours he was very agitated and combative and had to be seen by psychiatric. He had a follow-up with Dr. Gilda Crease in 3 weeks. 04/18/2016 -- notes reviewed from the vascular office where he was seen for his postop visit after the PTA of the right SFA and popliteal arteries on 03/12/2016. He underwent an ABI which showed right ABI to be more than 1.3 and left to be more than 1 more than 1.3, great toe and PPG waveforms are decreased bilaterally. No additional intervention indicated at this time and the patient was to follow-up in 3 months with an ABI and bilateral lower extremity duplex study. 05/02/2016 -- he complains that his compression stockings are causing him a lot of discomfort and he is not happy wearing them. 05/30/2016 -- the swelling of both legs has increased again and he has had blisters which have opened out into ulcerations. 09/12/2016 -- the lymphedema pumps are still not delivered and hopefully will be done within this week. As far as his skin substitute goes we are still awaiting a response from his insurance 10-10-2016: [name] presents today and states that his left  edema pumps have arrived but the he has only received treatment "5 times" since arrival. He also denies using the offloading boot for the  right heel but admits to having the boot. He states that staff is hesitant to assist and application of the lymphedema pumps. 10/17/2016 -- I understand he continues to take his diuretics and has been using his lymphedema pump once a day for about an hour. He cannot do it twice a day. 10-31-16 [name] presents today for follow-up on his bilateral lower extremity venous ulcers and his right posterior heel pressure ulcer. He states that he continues to do his lymphedema pumps once daily, unable to get assistance for twice daily. he does admit to increased drainage to the left leg more than the right leg. He denies any pain, fever, chills or overall ill feeling. 11/08/2016 -- the skin substitute which was run through his insurance company has a high copayment and he is not agreeable about getting this. 11/22/2016 -- he is still not able to use his lymphedema pumps as requested due to lack of help at his living facility. 12/23/2016 -- It was brought to my notice last week, that this patient has been extremely rude and insulting to my staff nurse and she was upset and was in tears. This is not the first time the patient has done this and in the past over several weeks, I have addressed the situation, from time to time. I have personally been insulted by him with rude comments, but I have thought this may be due to an element of dementia. However at this stage, his management is beyond the scope of our practice and I will speak to his nursing supervisor and have them assessed by his PCP for behavioral disorders. We will give him noticed to find a another wound center of his choice and will discharge him from our services. an Dance movement psychotherapist, under registered mail is going to be sent to him. However, I have personally discussed the aforementioned topic with him  and he has had no commands or questions. JOEPH, SZATKOWSKI ([id number]) Electronic Signature(s) Signed: 12/23/2016 2:08:30 PM By: Evlyn Kanner MD, FACS Entered By: Evlyn Kanner on 12/23/2016 14:08:30 [name] ([id number]) -------------------------------------------------------------------------------- Physical Exam Details Patient Name: [name] Date of Service: 12/23/2016 1:30 PM Medical Record Number: [id number] Patient Account Number: [id number] Date of Birth/Sex: February 13, 1946 (71 y.o. Male) Treating RN: [name] Primary Care Provider: Oretha Milch Other Clinician: Referring Provider: Oretha Milch Treating Provider/Extender: Rudene Re in Treatment: 51 Constitutional . Pulse regular. Respirations normal and unlabored. Afebrile. . Eyes Nonicteric. Reactive to light. Ears, Nose, Mouth, and Throat Lips, teeth, and gums WNL.Marland Kitchen Moist mucosa without lesions. Neck supple and nontender. No palpable supraclavicular or cervical adenopathy. Normal sized without goiter. Respiratory WNL. No retractions.. Breath sounds WNL, No rubs, rales, rhonchi, or wheeze.. Cardiovascular Heart rhythm and rate regular, no murmur or gallop.. Pedal Pulses WNL. No clubbing, cyanosis or edema. Chest Breasts symmetical and no nipple discharge.. Breast tissue WNL, no masses, lumps, or tenderness.. Genitourinary (GU) No hydrocele, spermatocele, tenderness of the cord, or testicular mass.Marland Kitchen Penis without lesions.Renetta Chalk without lesions. No cystocele, or rectocele. Pelvic support intact, no discharge.Marland Kitchen Urethra without masses, tenderness or scarring.Marland Kitchen Lymphatic No adneopathy. No adenopathy. No adenopathy. Musculoskeletal Adexa without tenderness or enlargement.. Digits and nails w/o clubbing, cyanosis, infection, petechiae, ischemia, or inflammatory conditions.. Integumentary (Hair, Skin) No suspicious lesions. No crepitus or fluctuance. No peri-wound warmth or erythema. No  masses.Marland Kitchen Psychiatric Judgement and insight Intact.. No evidence of depression, anxiety, or agitation.. Notes the lymphedema has gone up significantly and he is weeping a lot and  due to this there is maceration of his right heel wound and I have sharply debrided this with a curette and bleeding controlled with pressure Electronic Signature(s) Signed: 12/23/2016 2:08:52 PM By: Evlyn Kanner MD, FACS Entered By: Evlyn Kanner on 12/23/2016 14:08:50 [name] ([id number]) [name], Chanze Seth Bake ([id number]) -------------------------------------------------------------------------------- Physician Orders Details Patient Name: [name] Date of Service: 12/23/2016 1:30 PM Medical Record Number: [id number] Patient Account Number: [id number] Date of Birth/Sex: 1946-04-22 (71 y.o. 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 - at clinic o Cleanse wound with mild soap and water - at clinic o No tub bath. - only sink bath or bed bath Wound #3 Right Calcaneus o Clean wound with Normal Saline. - at clinic o Cleanse wound with mild soap and water - at clinic o No tub bath. - only sink bath or bed bath Wound #9 Left,Circumferential Lower Leg o Clean wound with Normal Saline. - at clinic o Cleanse wound  with mild soap and water - at clinic o No tub bath. - only sink bath or bed bath Anesthetic Wound #10 Right,Anterior Lower Leg o Topical Lidocaine 4% cream applied to wound bed prior to debridement - for clinic use only Wound #3 Right Calcaneus o Topical Lidocaine 4% cream applied to wound bed prior to debridement - for clinic use only Wound #9 Left,Circumferential Lower Leg o Topical Lidocaine 4% cream applied to wound bed prior to debridement - for clinic use only Dunkel, Arvin [name]. ([id number]) Skin Barriers/Peri-Wound Care Wound #10 Right,Anterior Lower Leg o Antifungal cream - Nystatin o Triamcinolone Acetonide Ointment Wound #3 Right Calcaneus o Antifungal cream - Nystatin o Triamcinolone Acetonide Ointment Wound #9 Left,Circumferential Lower Leg o Antifungal cream - Nystatin o Triamcinolone Acetonide Ointment Primary Wound Dressing Wound #10 Right,Anterior Lower Leg o Aquacel Ag Wound #3 Right Calcaneus o Aquacel Ag Wound #9 Left,Circumferential Lower Leg o Aquacel Ag Secondary Dressing Wound #10 Right,Anterior Lower Leg o ABD pad o XtraSorb Wound #3 Right Calcaneus o ABD pad o XtraSorb Wound #9 Left,Circumferential Lower Leg o ABD pad o XtraSorb Dressing Change Frequency Wound #10 Right,Anterior Lower Leg o Change dressing every week o Other: - SNF to change bilateral leg wrap if the drainage becomes visible but to do take off the heel dressing. Wound #3 Right Calcaneus o Change dressing every week o Other: - SNF to change bilateral leg wrap if the drainage becomes visible but to do take off the heel dressing. Wound #9 Left,Circumferential Lower Leg Yingling, Bosten [name]. ([id number]) o Change dressing every week o Other: - SNF to change bilateral leg wrap if the drainage becomes visible but to do take off the heel dressing. Follow-up Appointments Wound #10 Right,Anterior Lower Leg o Return Appointment in 1  week. Wound #3 Right Calcaneus o Return Appointment in 1 week. Wound #9 Left,Circumferential Lower Leg o Return Appointment in 1 week. Edema Control Wound #10 Right,Anterior Lower Leg o 3  Layer Compression System - Bilateral o Compression Pump: Use compression pump on left lower extremity for 30 minutes, twice daily. o Compression Pump: Use compression pump on right lower extremity for 30 minutes, twice daily. o Other: - unna to anchor Wound #3 Right Calcaneus o 3 Layer Compression System - Bilateral o Compression Pump: Use compression pump on left lower extremity for 30 minutes, twice daily. o Compression Pump: Use compression pump on right lower extremity for 30 minutes, twice daily. o Other: - unna to anchor Wound #9 Left,Circumferential Lower Leg o 3 Layer Compression System - Bilateral o Compression Pump: Use compression pump on left lower extremity for 30 minutes, twice daily. o Compression Pump: Use compression pump on right lower extremity for 30 minutes, twice daily. o Other: - unna to anchor Off-Loading Wound #10 Right,Anterior Lower Leg o Turn and reposition every 2 hours o Other: - sage boots at night and float heel when lying in bed *******lymphedema pumps to be placed on pt twice a day for one hour sessions******** Wound #3 Right Calcaneus o Turn and reposition every 2 hours Catalfamo, Tyquarius [name]. ([id number]) o Other: - sage boots at night and float heel when lying in bed *******lymphedema pumps to be placed on pt twice a day for one hour sessions******** Wound #9 Left,Circumferential Lower Leg o Turn and reposition every 2 hours o Other: - sage boots at night and float heel when lying in bed *******lymphedema pumps to be placed on pt twice a day for one hour sessions******** Additional Orders / Instructions Wound #10 Right,Anterior Lower Leg o Increase protein intake. Wound #3 Right Calcaneus o Increase protein  intake. Wound #9 Left,Circumferential Lower Leg o Increase protein intake. Medications-please add to medication list. Wound #10 Right,Anterior Lower Leg o Other: - Vitamin C, Vitamin A, Zinc, multivitamin Wound #3 Right Calcaneus o Other: - Vitamin C, Vitamin A, Zinc, multivitamin Wound #9 Left,Circumferential Lower Leg o Other: - Vitamin C, Vitamin A, Zinc, multivitamin Electronic Signature(s) Signed: 12/23/2016 2:54:30 PM By: [name] Signed: 12/23/2016 3:56:09 PM By: Evlyn Kanner MD, FACS Entered By: [name] on 12/23/2016 14:04:38 [name], Wymon Seth Bake ([id number]) -------------------------------------------------------------------------------- Problem List Details Patient Name: [name] Date of Service: 12/23/2016 1:30 PM Medical Record Number: [id number] Patient Account Number: [id number] Date of Birth/Sex: 1946/09/13 (71 y.o. Male) Treating RN: [name] Primary Care Provider: Oretha Milch Other Clinician: Referring Provider: Oretha Milch Treating Provider/Extender: Rudene Re in Treatment: 88 Active Problems ICD-10 Encounter Code Description Active Date Diagnosis E11.621 Type 2 diabetes mellitus with foot ulcer 01/01/2016 Yes L89.613 Pressure ulcer of right heel, stage 3 01/01/2016 Yes I89.0 Lymphedema, not elsewhere classified 01/01/2016 Yes E66.01 Morbid (severe) obesity due to excess calories 01/01/2016 Yes M70.871 Other soft tissue disorders related to use, overuse and 01/19/2016 Yes pressure, right ankle and foot 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/16/2016 Yes ulceration of heel and midfoot Inactive Problems Resolved Problems ICD-10 Code Description Active Date Resolved Date L89.322 Pressure ulcer of left buttock, stage 2 01/01/2016 01/01/2016 Electronic Signature(s) Signed: 12/23/2016 2:05:08 PM By: Evlyn Kanner MD, FACS Previous Signature: 12/23/2016 1:55:05 PM Version By: Evlyn Kanner MD, FACS [name],  Donald [name] Kitchen ([id number]) Entered By: Evlyn Kanner on 12/23/2016 14:05:07 [name] ([id number]) -------------------------------------------------------------------------------- Progress Note Details Patient Name: [name] Date of Service: 12/23/2016 1:30 PM Medical Record Number: [id number] Patient Account Number: [id number] Date of Birth/Sex: 1946-05-29 (71 y.o. Male) Treating RN: [name] Primary Care Provider: Oretha Milch Other Clinician: Referring Provider:  Katrinka Blazing, [place] Treating Provider/Extender: Rudene Re in Treatment: 17 Subjective Chief Complaint Information obtained from Patient Mr. Ingalls presents for follow-up on his right heel pressure ulcer and bilateral lower extremity lymphedema and venous ulcerations. History of Present Illness (HPI) The following HPI elements were documented for the patient's wound: Location: ulcerated area on the right heel, left gluteal region and thigh and then bilateral lower extremities Quality: Patient reports experiencing a sharp pain to affected area(s). Severity: Patient states wound are getting worse. Duration: Patient has had the wound for > 12 months prior to seeking treatment at the wound center Timing: Pain in wound is constant (hurts all the time) Context: The wound appeared gradually over time Modifying Factors: Other treatment(s) tried include: he sees his heart doctor and his primary care doctor Associated Signs and Symptoms: patient has not been able to walk for over a year now 71 year old gentleman with a known history of hypertension, diabetes, obstructive sleep apnea, COPD, diastolic CHF, coronary artery disease was admitted to the hospital with sepsis from an ulcer of the right heel and was treated there in October 2016. he also has chronic bilateral lower extremity edema and lymphedema. He had received vancomycin, Zosyn and at that stage and x-rays showed hardware in the right ankle but no evidence  of osteomyelitis. he was a former smoker. he was also treated with Augmentin orally for 10 days. He is either bedbound or wheelchair-bound and does not ambulate by himself. 01/19/2016 -- he has not been seen here for 3 weeks and this was because he was admitted to [facility] between 223 and 01/08/2016 for sepsis, UTI and pneumonia involving the left lung. he was treated with IV vancomycin and Zosyn and then meropenem. Was discharged home on oral Bactrim for 2 weeks none of his vascular test or x-rays were done and we will reorder these. 01/26/2016 -- he has not yet done the x-ray of his foot and is vascular tests are still pending. I have asked him to work on these with his nursing home staff. Addendum: he has got an x-ray of the right foot done which shows that his osteopenia but no specific ostial lysis or abnormal periosteal reaction. Final impression was degenerative changes with dorsal foot soft tissue swelling. 02/16/2016 -- lower extremity arterial duplex examination shows a 50-99% stenosis of the right tibioperoneal trunk. He had biphasic flow in the right SFA, popliteal and tibioperoneal trunk. Left-sided he had triphasic flow throughout JADIN, CREQUE ([id number]) 02/29/2016 -- he is awaiting his vascular opinion with Dr. Gilda Crease which is to be done on April 24 03/08/2016 -- he has not kept his appointment with Dr. Gilda Crease on April 24 and does not seem to know what happened about this. it's difficult to gauge whether he is in full control of his mental faculties as at times he is extremely rude to the nursing staff. 03/22/2016 -- the patient was seen by Dr. 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 Recommended that the patient had severe atherosclerotic changes of both lower extremities associated with ulceration and tissue loss of the foot.  This is a limb threatening ischemia and the patient was recommended to undergo angiography of the lower extremity with a hope for intervention for limb salvage. Patient agreed and will proceed to angiography. He was admitted to the hospital between May 2 and 03/15/2016 - he underwent induction of a  catheter into his right lower extremity and third order catheter placement with contrast injection to the right lower extremity for distal runoff. Percutaneous transluminal angioplasty of the right superficial femoral and popliteal arteries were done. He also had a right peroneal angioplasty. Patient had a postoperative hematoma and was admitted for observation and in the next 24 hours he was very agitated and combative and had to be seen by psychiatric. He had a follow-up with Dr. Gilda Crease in 3 weeks. 04/18/2016 -- notes reviewed from the vascular office where he was seen for his postop visit after the PTA of the right SFA and popliteal arteries on 03/12/2016. He underwent an ABI which showed right ABI to be more than 1.3 and left to be more than 1 more than 1.3, great toe and PPG waveforms are decreased bilaterally. No additional intervention indicated at this time and the patient was to follow-up in 3 months with an ABI and bilateral lower extremity duplex study. 05/02/2016 -- he complains that his compression stockings are causing him a lot of discomfort and he is not happy wearing them. 05/30/2016 -- the swelling of both legs has increased again and he has had blisters which have opened out into ulcerations. 09/12/2016 -- the lymphedema pumps are still not delivered and hopefully will be done within this week. As far as his skin substitute goes we are still awaiting a response from his insurance 10-10-2016: [name] presents today and states that his left edema pumps have arrived but the he has only received treatment "5 times" since arrival. He also denies using the offloading boot for the  right heel but admits to having the boot. He states that staff is hesitant to assist and application of the lymphedema pumps. 10/17/2016 -- I understand he continues to take his diuretics and has been using his lymphedema pump once a day for about an hour. He cannot do it twice a day. 10-31-16 Mr. Gasper presents today for follow-up on his bilateral lower extremity venous ulcers and his right posterior heel pressure ulcer. He states that he continues to do his lymphedema pumps once daily, unable to get assistance for twice daily. he does admit to increased drainage to the left leg more than the right leg. He denies any pain, fever, chills or overall ill feeling. 11/08/2016 -- the skin substitute which was run through his insurance company has a high copayment and he is not agreeable about getting this. 11/22/2016 -- he is still not able to use his lymphedema pumps as requested due to lack of help at his living facility. 12/23/2016 -- It was brought to my notice last week, that this patient has been extremely rude and insulting to my staff nurse and she was upset and was in tears. This is not the first time the patient has done this and Rieves, Jeffrey [name]. ([id number]) in the past over several weeks, I have addressed the situation, from time to time. I have personally been insulted by him with rude comments, but I have thought this may be due to an element of dementia. However at this stage, his management is beyond the scope of our practice and I will speak to his nursing supervisor and have them assessed by his PCP for behavioral disorders. We will give him noticed to find a another wound center of his choice and will discharge him from our services. an Dance movement psychotherapist, under registered mail is going to be sent to him. However, I have personally discussed the aforementioned topic with  him and he has had no commands or questions. Objective Constitutional Pulse regular. Respirations normal and  unlabored. Afebrile. Vitals Time Taken: 1:36 PM, Height: 70 in, Weight: 235 lbs, BMI: 33.7, Pulse: 68 bpm, Respiratory Rate: 18 breaths/min, Blood Pressure: 127/84 mmHg. Eyes Nonicteric. Reactive to light. Ears, Nose, Mouth, and Throat Lips, teeth, and gums WNL.Marland Kitchen Moist mucosa without lesions. Neck supple and nontender. No palpable supraclavicular or cervical adenopathy. Normal sized without goiter. Respiratory WNL. No retractions.. Breath sounds WNL, No rubs, rales, rhonchi, or wheeze.. Cardiovascular Heart rhythm and rate regular, no murmur or gallop.. Pedal Pulses WNL. No clubbing, cyanosis or edema. Chest Breasts symmetical and no nipple discharge.. Breast tissue WNL, no masses, lumps, or tenderness.. Genitourinary (GU) No hydrocele, spermatocele, tenderness of the cord, or testicular mass.Marland Kitchen Penis without lesions.Renetta Chalk without lesions. No cystocele, or rectocele. Pelvic support intact, no discharge.Marland Kitchen Urethra without masses, tenderness or scarring.Marland Kitchen Lymphatic No adneopathy. No adenopathy. No adenopathy. Musculoskeletal Adexa without tenderness or enlargement.. Digits and nails w/o clubbing, cyanosis, infection, petechiae, Noll, Candon [name]. ([id number]) ischemia, or inflammatory conditions.Marland Kitchen Psychiatric Judgement and insight Intact.. No evidence of depression, anxiety, or agitation.. General Notes: the lymphedema has gone up significantly and he is weeping a lot and due to this there is maceration of his right heel wound and I have sharply debrided this with a curette and bleeding controlled with pressure Integumentary (Hair, Skin) No suspicious lesions. No crepitus or fluctuance. No peri-wound warmth or erythema. No masses.. Wound #10 status is Open. Original cause of wound was Blister. The wound is located on the Right,Anterior Lower Leg. The wound measures 13cm length x 13cm width x 0.1cm depth; 132.732cm^2 area and 13.273cm^3 volume. The wound is limited to skin breakdown.  There is no tunneling or undermining noted. There is a large amount of serosanguineous drainage noted. The wound margin is flat and intact. There is large (67-100%) red granulation within the wound bed. There is a small (1-33%) amount of necrotic tissue within the wound bed including Adherent Slough. The periwound skin appearance did not exhibit: Callus, Crepitus, Excoriation, Induration, Rash, Scarring, Dry/Scaly, Maceration, Atrophie Blanche, Cyanosis, Ecchymosis, Hemosiderin Staining, Mottled, Pallor, Rubor, Erythema. Periwound temperature was noted as No Abnormality. The periwound has tenderness on palpation. Wound #3 status is Open. Original cause of wound was Pressure Injury. The wound is located on the Right Calcaneus. The wound measures 0.7cm length x 1cm width x 0.2cm depth; 0.55cm^2 area and 0.11cm^3 volume. The wound is limited to skin breakdown. There is no tunneling or undermining noted. There is a large amount of serous drainage noted. The wound margin is flat and intact. There is medium (34-66%) pink granulation within the wound bed. There is a medium (34-66%) amount of necrotic tissue within the wound bed including Adherent Slough. The periwound skin appearance exhibited: Maceration. The periwound skin appearance did not exhibit: Callus, Crepitus, Excoriation, Induration, Rash, Scarring, Dry/Scaly, Atrophie Blanche, Cyanosis, Ecchymosis, Hemosiderin Staining, Mottled, Pallor, Rubor, Erythema. The periwound has tenderness on palpation. Wound #9 status is Open. Original cause of wound was Gradually Appeared. The wound is located on the Left,Circumferential Lower Leg. The wound measures 12cm length x 24cm width x 0.1cm depth; 226.195cm^2 area and 22.619cm^3 volume. The wound is limited to skin breakdown. There is no tunneling or undermining noted. There is a large amount of serosanguineous drainage noted. The wound margin is flat and intact. There is large (67-100%) pink granulation  within the wound bed. There is a small (1-33%) amount of necrotic tissue within  the wound bed including Adherent Slough. The periwound skin appearance exhibited: Maceration, Mottled. The periwound skin appearance did not exhibit: Callus, Crepitus, Excoriation, Induration, Rash, Scarring, Dry/Scaly, Atrophie Blanche, Cyanosis, Ecchymosis, Hemosiderin Staining, Pallor, Rubor, Erythema. The periwound has tenderness on palpation. Assessment Active Problems Kandis MannanLINDLEY, Kenon [name]. 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 There was a Skin/Subcutaneous Tissue Debridement [id number]) debridement with total area of 0.7 sq cm performed by Evlyn KannerBritto, Katya Rolston, MD. with the following instrument(s): Curette to remove Viable and Non-Viable tissue/material including Fibrin/Slough and Subcutaneous after achieving pain control using Lidocaine 4% Topical Solution. A time out was conducted at 13:59, prior to the start of the procedure. A Minimum amount of bleeding was controlled with Pressure. The procedure was tolerated well with a pain level of 0 throughout and a pain level of 0 following the procedure. Post Debridement Measurements: 0.7cm length x 1cm width x 0.2cm depth; 0.11cm^3 volume. Post debridement Stage noted as Category/Stage III. Character of Wound/Ulcer Post Debridement is improved. Severity of Tissue Post Debridement is: Fat layer exposed. Post procedure Diagnosis Wound #3: Same as Pre-Procedure Plan Wound Cleansing: Wound #10 Right,Anterior Lower  Leg: Clean wound with Normal Saline. - at clinic Cleanse wound with mild soap and water - at clinic No tub bath. - only sink bath or bed bath Wound #3 Right Calcaneus: Clean wound with Normal Saline. - at clinic Cleanse wound with mild soap and water - at clinic No tub bath. - only sink bath or bed bath Wound #9 Left,Circumferential Lower Leg: Clean wound with Normal Saline. - at clinic Cleanse wound with mild soap and water - at clinic No tub bath. - only sink bath or bed bath Anesthetic: Wound #10 Right,Anterior Lower Leg: Barkett, Cesareo [name]. ([id number]) Topical Lidocaine 4% cream applied to wound bed prior to debridement - for clinic use only Wound #3 Right Calcaneus: Topical Lidocaine 4% cream applied to wound bed prior to debridement - for clinic use only Wound #9 Left,Circumferential Lower Leg: Topical Lidocaine 4% cream applied to wound bed prior to debridement - for clinic use only Skin Barriers/Peri-Wound Care: Wound #10 Right,Anterior Lower Leg: Antifungal cream - Nystatin Triamcinolone Acetonide Ointment Wound #3 Right Calcaneus: Antifungal cream - Nystatin Triamcinolone Acetonide Ointment Wound #9 Left,Circumferential Lower Leg: Antifungal cream - Nystatin Triamcinolone Acetonide Ointment Primary Wound Dressing: Wound #10 Right,Anterior Lower Leg: Aquacel Ag Wound #3 Right Calcaneus: Aquacel Ag Wound #9 Left,Circumferential Lower Leg: Aquacel Ag Secondary Dressing: Wound #10 Right,Anterior Lower Leg: ABD pad XtraSorb Wound #3 Right Calcaneus: ABD pad XtraSorb Wound #9 Left,Circumferential Lower Leg: ABD pad XtraSorb Dressing Change Frequency: Wound #10 Right,Anterior Lower Leg: Change dressing every week Other: - SNF to change bilateral leg wrap if the drainage becomes visible but to do take off the heel dressing. Wound #3 Right Calcaneus: Change dressing every week Other: - SNF to change bilateral leg wrap if the drainage becomes visible but to do take  off the heel dressing. Wound #9 Left,Circumferential Lower Leg: Change dressing every week Other: - SNF to change bilateral leg wrap if the drainage becomes  visible but to do take off the heel dressing. Follow-up Appointments: Wound #10 Right,Anterior Lower Leg: Return Appointment in 1 week. Wound #3 Right Calcaneus: Return Appointment in 1 week. Wound #9 Left,Circumferential Lower Leg: Ebert, Caliph [name]. ([id number]) Return Appointment in 1 week. Edema Control: Wound #10 Right,Anterior Lower Leg: 3 Layer Compression System - Bilateral Compression Pump: Use compression pump on left lower extremity for 30 minutes, twice daily. Compression Pump: Use compression pump on right lower extremity for 30 minutes, twice daily. Other: - unna to anchor Wound #3 Right Calcaneus: 3 Layer Compression System - Bilateral Compression Pump: Use compression pump on left lower extremity for 30 minutes, twice daily. Compression Pump: Use compression pump on right lower extremity for 30 minutes, twice daily. Other: - unna to anchor Wound #9 Left,Circumferential Lower Leg: 3 Layer Compression System - Bilateral Compression Pump: Use compression pump on left lower extremity for 30 minutes, twice daily. Compression Pump: Use compression pump on right lower extremity for 30 minutes, twice daily. Other: - unna to anchor Off-Loading: Wound #10 Right,Anterior Lower Leg: Turn and reposition every 2 hours Other: - sage boots at night and float heel when lying in bed *******lymphedema pumps to be placed on pt twice a day for one hour sessions******** Wound #3 Right Calcaneus: Turn and reposition every 2 hours Other: - sage boots at night and float heel when lying in bed *******lymphedema pumps to be placed on pt twice a day for one hour sessions******** Wound #9 Left,Circumferential Lower Leg: Turn and reposition every 2 hours Other: - sage boots at night and float heel when lying in bed *******lymphedema  pumps to be placed on pt twice a day for one hour sessions******** Additional Orders / Instructions: Wound #10 Right,Anterior Lower Leg: Increase protein intake. Wound #3 Right Calcaneus: Increase protein intake. Wound #9 Left,Circumferential Lower Leg: Increase protein intake. Medications-please add to medication list.: Wound #10 Right,Anterior Lower Leg: Other: - Vitamin C, Vitamin A, Zinc, multivitamin Wound #3 Right Calcaneus: Other: - Vitamin C, Vitamin A, Zinc, multivitamin Wound #9 Left,Circumferential Lower Leg: Other: - Vitamin C, Vitamin A, Zinc, multivitamin Guadamuz, Braxxton [name]. ([id number]) I have recommended : 1. using Aquacell ag and Drawtex for his right heel to be changed every week 2. have urged him to take his diuretic regularly 3. he has received his lymphedema pumps but he is finding it difficult to get someone to help him put these on and off at his nursing home and we will attempt to talk to the nursing supervisor there. at present he is using them once a day only. 4. We will continue with silver alginate and a 3 layer Profore. 5. we will treat him for the next 30 days prior to his discharge from our services and this has been addressed with him today Electronic Signature(s) Signed: 12/23/2016 2:11:04 PM By: Evlyn Kanner MD, FACS Entered By: Evlyn Kanner on 12/23/2016 14:11:04 [name] ([id number]) -------------------------------------------------------------------------------- SuperBill Details Patient Name: [name] Date of Service: 12/23/2016 Medical Record Number: [id number] Patient Account Number: [id number] Date of Birth/Sex: 12/05/1945 (71 y.o. 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 Facility Procedures CPT4 Code: [id number] Description: 11042 - DEB SUBQ TISSUE 20 SQ CM/< ICD-10 Description Diagnosis E11.621 Type 2 diabetes mellitus with foot ulcer L89.613 Pressure ulcer of right heel, stage 3 I89.0 Lymphedema, not elsewhere classified Modifier: Quantity: 1 Physician Procedures CPT4 Code: [id number] Description: 11042 - WC PHYS SUBQ TISS 20 SQ CM ICD-10 Description Diagnosis E11.621 Type 2 diabetes mellitus with foot ulcer L89.613 Pressure ulcer of right heel, stage 3 I89.0 Lymphedema, not elsewhere classified Modifier: Quantity: 1 Electronic Signature(s) Signed: 12/23/2016 2:11:18 PM By: Evlyn Kanner MD, FACS Entered By: Evlyn Kanner on 12/23/2016 14:11:16

## 2016-12-23 NOTE — Progress Notes (Signed)
[name], Rohail V. ([id number]) Visit Report for 12/23/2016 Arrival Information Details Patient Name: [name], Imari V. Date of Service: 12/23/2016 1:30 PM Medical Record Number: [id number] Patient Account Number: [id number] Date of Birth/Sex: 06-27-1946 [age] y.o. Male) Treating RN: Curtis Sitesorthy, Joanna Primary Care Dewanda Fennema: Oretha MilchSMITH, [name] Other Clinician: Referring Airianna Kreischer: Oretha MilchSMITH, [name] Treating Bernie Fobes/Extender: Rudene ReBritto, Errol Weeks in Treatment: 4351 Visit Information History Since Last Visit Added or deleted any medications: No Patient Arrived: Wheel Chair Any new allergies or adverse reactions: No Arrival Time: 13:30 Had a fall or experienced change in No activities of daily living that may affect Accompanied By: self risk of falls: Transfer Assistance: None Signs or symptoms of abuse/neglect since last No Patient Identification Verified: Yes visito Secondary Verification Process Yes Hospitalized since last visit: No Completed: Has Dressing in Place as Prescribed: Yes Patient Requires Transmission-Based No Has Compression in Place as Prescribed: Yes Precautions: Pain Present Now: No Patient Has Alerts: Yes Patient Alerts: DM II Electronic Signature(s) Signed: 12/23/2016 2:54:30 PM By: Curtis Sitesorthy, Joanna Entered By: Curtis Sitesorthy, Joanna on 12/23/2016 13:35:50 Clint GuyLINDLEY, Anterio Seth BakeV. ([id number]) -------------------------------------------------------------------------------- Encounter Discharge Information Details Patient Name: [name], Tristram V. Date of Service: 12/23/2016 1:30 PM Medical Record Number: [id number] Patient Account Number: [id number] Date of Birth/Sex: 06-27-1946 [age] y.o. Male) Treating RN: Curtis Sitesorthy, Joanna Primary Care Anupama Piehl: Oretha MilchSMITH, [name] Other Clinician: Referring Yousof Alderman: Oretha MilchSMITH, [name] Treating Jorie Zee/Extender: Rudene ReBritto, Errol Weeks in Treatment: 2251 Encounter Discharge Information Items Discharge Pain Level: 0 Discharge Condition: Stable Ambulatory Status: Wheelchair Discharge  Destination: Nursing Home Transportation: Private Auto Accompanied By: self Schedule Follow-up Appointment: Yes Medication Reconciliation completed and provided to Patient/Care No Suleima Ohlendorf: Provided on Clinical Summary of Care: 12/23/2016 Form Type Recipient Paper Patient EL Electronic Signature(s) Signed: 12/23/2016 2:52:52 PM By: Curtis Sitesorthy, Joanna Previous Signature: 12/23/2016 2:46:45 PM Version By: Gwenlyn PerkingMoore, Shelia Entered By: Curtis Sitesorthy, Joanna on 12/23/2016 14:52:52 Liskey, Petar VMarland Kitchen. ([id number]) -------------------------------------------------------------------------------- Lower Extremity Assessment Details Patient Name: [name], Kelvyn V. Date of Service: 12/23/2016 1:30 PM Medical Record Number: [id number] Patient Account Number: [id number] Date of Birth/Sex: 06-27-1946 [age] y.o. Male) Treating RN: Curtis Sitesorthy, Joanna Primary Care Paulena Servais: Oretha MilchSMITH, [name] Other Clinician: Referring Tirso Laws: Oretha MilchSMITH, [name] Treating Rufino Staup/Extender: Rudene ReBritto, Errol Weeks in Treatment: 51 Edema Assessment Assessed: [Left: No] [Right: No] Edema: [Left: Yes] [Right: Yes] Calf Left: Right: Point of Measurement: 34 cm From Medial Instep 43.7 cm 40 cm Ankle Left: Right: Point of Measurement: 12 cm From Medial Instep 27.3 cm 25 cm Vascular Assessment Pulses: Dorsalis Pedis Palpable: [Left:Yes] [Right:Yes] Posterior Tibial Extremity colors, hair growth, and conditions: Extremity Color: [Left:Hyperpigmented] [Right:Hyperpigmented] Hair Growth on Extremity: [Left:No] [Right:No] Temperature of Extremity: [Left:Warm] [Right:Warm] Capillary Refill: [Left:< 3 seconds] [Right:< 3 seconds] Electronic Signature(s) Signed: 12/23/2016 2:54:30 PM By: Curtis Sitesorthy, Joanna Entered By: Curtis Sitesorthy, Joanna on 12/23/2016 13:47:13 Basford, Darragh V. ([id number]) -------------------------------------------------------------------------------- Multi Wound Chart Details Patient Name: [name], Carol V. Date of Service: 12/23/2016 1:30  PM Medical Record Number: [id number] Patient Account Number: [id number] Date of Birth/Sex: 06-27-1946 [age] y.o. Male) Treating RN: Curtis Sitesorthy, Joanna Primary Care Abbas Beyene: Oretha MilchSMITH, [name] Other Clinician: Referring Pauleen Goleman: Oretha MilchSMITH, [name] Treating Epimenio Schetter/Extender: Rudene ReBritto, Errol Weeks in Treatment: 4851 Vital Signs Height(in): 70 Pulse(bpm): 68 Weight(lbs): 235 Blood Pressure 127/84 (mmHg): Body Mass Index(BMI): 34 Temperature(F): Respiratory Rate 18 (breaths/min): Photos: Wound Location: Right Lower Leg - Anterior Right Calcaneus Left Lower Leg - Circumfernential Wounding Event: Blister Pressure Injury Gradually Appeared Primary Etiology: Lymphedema Pressure Ulcer 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 Comorbid History: Cataracts, Chronic Cataracts, Chronic  Cataracts, Chronic Obstructive Pulmonary Obstructive Pulmonary Obstructive Pulmonary Disease (COPD), Sleep Disease (COPD), Sleep Disease (COPD), Sleep Apnea, Angina, Apnea, Angina, Apnea, Angina, Congestive Heart Failure, Congestive Heart Failure, Congestive Heart Failure, Hypertension, Type II Hypertension, Type II Hypertension, Type II Diabetes, Gout, Diabetes, Gout, Diabetes, Gout, Osteoarthritis, Neuropathy Osteoarthritis, Neuropathy Osteoarthritis, Neuropathy Date Acquired: 08/22/2016 10/31/2015 08/09/2016 [name] Otto ([id number]) Weeks of Treatment: 17 51 18 Wound Status: Open Open Open Clustered Wound: No No Yes Measurements L x W x D 13x13x0.1 0.7x1x0.2 12x24x0.1 (cm) Area (cm) : 132.732 0.55 226.195 Volume (cm) : 13.273 0.11 22.619 % Reduction in Area: -11167.60% 92.20% -125.90% % Reduction in Volume: -11148.30% 92.20% -125.90% Classification: Partial Thickness Category/Stage III Grade 2 HBO Classification: Grade 1 Grade 1 N/A Exudate Amount: Large Large Large Exudate Type: Serosanguineous Serous  Serosanguineous Exudate Color: red, brown amber red, brown Wound Margin: Flat and Intact Flat and Intact Flat and Intact Granulation Amount: Large (67-100%) Medium (34-66%) Large (67-100%) Granulation Quality: Red Pink Pink Necrotic Amount: Small (1-33%) Medium (34-66%) Small (1-33%) Exposed Structures: Fascia: No Fascia: No Fascia: No Fat Layer (Subcutaneous Fat Layer (Subcutaneous Fat Layer (Subcutaneous Tissue) Exposed: No Tissue) Exposed: No Tissue) Exposed: No Tendon: No Tendon: No Tendon: No Muscle: No Muscle: No Muscle: No Joint: No Joint: No Joint: No Bone: No Bone: No Bone: No Limited to Skin Limited to Skin Limited to Skin Breakdown Breakdown Breakdown Epithelialization: Large (67-100%) None Large (67-100%) Debridement: N/A Debridement ([id number]- N/A 11047) Pre-procedure N/A 13:59 N/A Verification/Time Out Taken: Pain Control: N/A Lidocaine 4% Topical N/A Solution Tissue Debrided: N/A Fibrin/Slough, N/A Subcutaneous Level: N/A Skin/Subcutaneous N/A Tissue Debridement Area (sq N/A 0.7 N/A cm): Instrument: N/A Curette N/A Bleeding: N/A Minimum N/A Hemostasis Achieved: N/A Pressure N/A Procedural Pain: N/A 0 N/A Post Procedural Pain: N/A 0 N/A Debridement Treatment N/A Procedure was tolerated N/A Response: well N/A 0.7x1x0.2 N/A Golden, Phil V. ([id number]) Post Debridement Measurements L x W x D (cm) Post Debridement N/A 0.11 N/A Volume: (cm) Post Debridement N/A Category/Stage III N/A Stage: Periwound Skin Texture: Excoriation: No Excoriation: No Excoriation: No Induration: No Induration: No Induration: No Callus: No Callus: No Callus: No Crepitus: No Crepitus: No Crepitus: No Rash: No Rash: No Rash: No Scarring: No Scarring: No Scarring: No Periwound Skin Maceration: No Maceration: Yes Maceration: Yes Moisture: Dry/Scaly: No Dry/Scaly: No Dry/Scaly: No Periwound Skin Color: Atrophie Blanche: No Atrophie Blanche: No Mottled:  Yes Cyanosis: No Cyanosis: No Atrophie Blanche: No Ecchymosis: No Ecchymosis: No Cyanosis: No Erythema: No Erythema: No Ecchymosis: No Hemosiderin Staining: No Hemosiderin Staining: No Erythema: No Mottled: No Mottled: No Hemosiderin Staining: No Pallor: No Pallor: No Pallor: No Rubor: No Rubor: No Rubor: No Temperature: No Abnormality N/A N/A Tenderness on Yes Yes Yes Palpation: Wound Preparation: Ulcer Cleansing: Other: Ulcer Cleansing: Other: Ulcer Cleansing: Other: soap and water soap and water soap and water Topical Anesthetic Topical Anesthetic Topical Anesthetic Applied: None Applied: Other: lidocaine Applied: None 4% Procedures Performed: N/A Debridement N/A Treatment Notes Electronic Signature(s) Signed: 12/23/2016 2:05:17 PM By: Evlyn Kanner MD, FACS Entered By: Evlyn Kanner on 12/23/2016 14:05:17 [name] Otto ([id number]) -------------------------------------------------------------------------------- Multi-Disciplinary Care Plan Details Patient Name: [name] Otto. Date of Service: 12/23/2016 1:30 PM Medical Record Number: [id number] Patient Account Number: [id number] Date of Birth/Sex: 07/01/1946 (71 y.o. Male) Treating RN: [name] Primary Care Renita Brocks: Oretha Milch Other Clinician: Referring Ladarrious Kirksey: Oretha Milch Treating Mayra Jolliffe/Extender: Rudene Re in Treatment: 84 Active Inactive ` Abuse / Safety / Falls / Self Care Management  Nursing Diagnoses: Potential for falls Goals: Patient will remain injury free Date Initiated: 03/01/2016 Target Resolution Date: 12/09/2016 Goal Status: Active Interventions: Assess fall risk on admission and as needed Notes: ` Nutrition Nursing Diagnoses: Imbalanced nutrition Goals: Patient/caregiver agrees to and verbalizes understanding of need to use nutritional supplements and/or vitamins as prescribed Date Initiated: 03/01/2016 Target Resolution Date: 12/09/2016 Goal Status:  Active Interventions: Assess patient nutrition upon admission and as needed per policy Notes: ` Orientation to the Wound Care Program Nursing Diagnoses: Knowledge deficit related to the wound healing center program JOURDAIN, GUAY ([id number]) Goals: Patient/caregiver will verbalize understanding of the Wound Healing Center Program Date Initiated: 03/01/2016 Target Resolution Date: 12/09/2016 Goal Status: Active Interventions: Provide education on orientation to the wound center Notes: ` Pain, Acute or Chronic Nursing Diagnoses: Pain, acute or chronic: actual or potential Potential alteration in comfort, pain Goals: Patient will verbalize adequate pain control and receive pain control interventions during procedures as needed Date Initiated: 03/01/2016 Target Resolution Date: 12/09/2016 Goal Status: Active Patient/caregiver will verbalize adequate pain control between visits Date Initiated: 03/01/2016 Target Resolution Date: 12/09/2016 Goal Status: Active Interventions: Assess comfort goal upon admission Complete pain assessment as per visit requirements Notes: ` Pressure Nursing Diagnoses: Knowledge deficit related to causes and risk factors for pressure ulcer development Knowledge deficit related to management of pressures ulcers Goals: Patient/caregiver will verbalize risk factors for pressure ulcer development Date Initiated: 03/01/2016 Target Resolution Date: 12/09/2016 Goal Status: Active Interventions: Assess offloading mechanisms upon admission and as needed Hengst, Modesto V. ([id number]) Notes: ` Wound/Skin Impairment Nursing Diagnoses: Impaired tissue integrity Goals: Ulcer/skin breakdown will have a volume reduction of 30% by week 4 Date Initiated: 03/01/2016 Target Resolution Date: 12/09/2016 Goal Status: Active Ulcer/skin breakdown will have a volume reduction of 50% by week 8 Date Initiated: 03/01/2016 Target Resolution Date: 12/09/2016 Goal Status:  Active Ulcer/skin breakdown will have a volume reduction of 80% by week 12 Date Initiated: 03/01/2016 Target Resolution Date: 12/09/2016 Goal Status: Active Interventions: Assess ulceration(s) every visit Notes: Electronic Signature(s) Signed: 12/23/2016 2:54:30 PM By: [name] Entered By: [name] on 12/23/2016 14:02:14 [name] Otto ([id number]) -------------------------------------------------------------------------------- Pain Assessment Details Patient Name: [name] Otto Date of Service: 12/23/2016 1:30 PM Medical Record Number: [id number] Patient Account Number: [id number] Date of Birth/Sex: 25-Dec-1945 (71 y.o. Male) Treating RN: [name] Primary Care Destry Bezdek: Oretha Milch Other Clinician: Referring Dixon Luczak: Oretha Milch Treating Tekelia Kareem/Extender: Rudene Re in Treatment: 48 Active Problems Location of Pain Severity and Description of Pain Patient Has Paino Yes Site Locations Pain Location: Pain in Ulcers With Dressing Change: Yes Duration of the Pain. Constant / Intermittento Constant Pain Management and Medication Current Pain Management: Notes Topical or injectable lidocaine is offered to patient for acute pain when surgical debridement is performed. If needed, Patient is instructed to use over the counter pain medication for the following 24-48 hours after debridement. Wound care MDs do not prescribed pain medications. Patient has chronic pain or uncontrolled pain. Patient has been instructed to make an appointment with their Primary Care Physician for pain management. Electronic Signature(s) Signed: 12/23/2016 2:54:30 PM By: [name] Entered By: [name] on 12/23/2016 13:36:07 [name] Otto ([id number]) -------------------------------------------------------------------------------- Patient/Caregiver Education Details Patient Name: [name] Otto Date of Service: 12/23/2016 1:30 PM Medical Record Number:  [id number] Patient Account Number: [id number] Date of Birth/Gender: [birth year] (71 y.o. Male) Treating RN: [name] Primary Care Physician: Oretha Milch Other Clinician: Referring Physician: Oretha Milch Treating Physician/Extender: Rudene Re in Treatment:  51 Education Assessment Education Provided To: Patient Education Topics Provided Wound/Skin Impairment: Handouts: Other: list of wound care centers in [place] Methods: Printed Responses: State content correctly Electronic Signature(s) Signed: 12/23/2016 2:54:30 PM By: [name] Entered By: [name] on 12/23/2016 14:53:09 Oconnell, Gardiner V. ([id number]) -------------------------------------------------------------------------------- Wound Assessment Details Patient Name: [name] Otto. Date of Service: 12/23/2016 1:30 PM Medical Record Number: [id number] Patient Account Number: [id number] Date of Birth/Sex: 12-01-1945 (71 y.o. Male) Treating RN: [name] Primary Care Treyven Lafauci: Oretha Milch Other Clinician: Referring Vida Nicol: Oretha Milch Treating Latasha Puskas/Extender: Rudene Re in Treatment: 46 Wound Status Wound Number: 10 Primary Lymphedema Etiology: Wound Location: Right Lower Leg - Anterior Secondary Diabetic Wound/Ulcer of the Lower Wounding Event: Blister Etiology: Extremity Date Acquired: 08/22/2016 Wound Open Weeks Of Treatment: 17 Status: Clustered Wound: No Comorbid Cataracts, Chronic Obstructive History: Pulmonary Disease (COPD), Sleep Apnea, Angina, Congestive Heart Failure, Hypertension, Type II Diabetes, Gout, Osteoarthritis, Neuropathy Photos Wound Measurements Length: (cm) 13 Width: (cm) 13 Depth: (cm) 0.1 Area: (cm) 132.732 Volume: (cm) 13.273 % Reduction in Area: -11167.6% % Reduction in Volume: -11148.3% Epithelialization: Large (67-100%) Tunneling: No Undermining: No Wound Description Classification: Partial Thickness Diabetic Severity [address]): Grade  1 Wound Margin: Flat and Intact Exudate Amount: Large Exudate Type: Serosanguineous Exudate Color: red, brown Wound Bed Marse, Monterio V. ([id number]) Granulation Amount: Large (67-100%) Exposed Structure Granulation Quality: Red Fascia Exposed: No Necrotic Amount: Small (1-33%) Fat Layer (Subcutaneous Tissue) Exposed: No Necrotic Quality: Adherent Slough Tendon Exposed: No Muscle Exposed: No Joint Exposed: No Bone Exposed: No Limited to Skin Breakdown Periwound Skin Texture Texture Color No Abnormalities Noted: No No Abnormalities Noted: No Callus: No Atrophie Blanche: No Crepitus: No Cyanosis: No Excoriation: No Ecchymosis: No Induration: No Erythema: No Rash: No Hemosiderin Staining: No Scarring: No Mottled: No Pallor: No Moisture Rubor: No No Abnormalities Noted: No Dry / Scaly: No Temperature / Pain Maceration: No Temperature: No Abnormality Tenderness on Palpation: Yes Wound Preparation Ulcer Cleansing: Other: soap and water, Topical Anesthetic Applied: None Treatment Notes Wound #10 (Right, Anterior Lower Leg) 1. Cleansed with: Clean wound with Normal Saline 2. Anesthetic Topical Lidocaine 4% cream to wound bed prior to debridement 4. Dressing Applied: Aquacel Ag 5. Secondary Dressing Applied ABD Pad 7. Secured with Tape 3 Layer Compression System - Bilateral Notes xtrasorb Electronic Signature(s) Signed: 12/23/2016 2:54:30 PM By: Percival Spanish, Corie V. ([id number]) Entered By: [name] on 12/23/2016 13:59:54 [name] Otto ([id number]) -------------------------------------------------------------------------------- Wound Assessment Details Patient Name: [name] Otto. Date of Service: 12/23/2016 1:30 PM Medical Record Number: [id number] Patient Account Number: [id number] Date of Birth/Sex: 1946/06/17 (71 y.o. Male) Treating RN: [name] Primary Care Tove Wideman: Oretha Milch Other Clinician: Referring Mahmoud Blazejewski: Oretha Milch Treating Imo Cumbie/Extender: Rudene Re in Treatment: 51 Wound Status Wound Number: 3 Primary Pressure Ulcer Etiology: Wound Location: Right Calcaneus Wound Open Wounding Event: Pressure Injury Status: Date Acquired: 10/31/2015 Comorbid Cataracts, Chronic Obstructive Weeks Of Treatment: 51 History: Pulmonary Disease (COPD), Sleep Clustered Wound: No Apnea, Angina, Congestive Heart Failure, Hypertension, Type II Diabetes, Gout, Osteoarthritis, Neuropathy Photos Wound Measurements Length: (cm) 0.7 Width: (cm) 1 Depth: (cm) 0.2 Area: (cm) 0.55 Volume: (cm) 0.11 % Reduction in Area: 92.2% % Reduction in Volume: 92.2% Epithelialization: None Tunneling: No Undermining: No Wound Description Classification: Category/Stage III Foul Odor A Diabetic Severity (Wagner): Grade 1 Wound Margin: Flat and Intact Exudate Amount: Large Exudate Type: Serous Exudate Color: amber fter Cleansing: No Wound Bed Granulation Amount: Medium (34-66%) Exposed Structure Granulation Quality: Pink  Fascia Exposed: No Mcmorris, Irene V. ([id number]) Necrotic Amount: Medium (34-66%) Fat Layer (Subcutaneous Tissue) Exposed: No Necrotic Quality: Adherent Slough Tendon Exposed: No Muscle Exposed: No Joint Exposed: No Bone Exposed: No Limited to Skin Breakdown Periwound Skin Texture Texture Color No Abnormalities Noted: No No Abnormalities Noted: No Callus: No Atrophie Blanche: No Crepitus: No Cyanosis: No Excoriation: No Ecchymosis: No Induration: No Erythema: No Rash: No Hemosiderin Staining: No Scarring: No Mottled: No Pallor: No Moisture Rubor: No No Abnormalities Noted: No Dry / Scaly: No Temperature / Pain Maceration: Yes Tenderness on Palpation: Yes Wound Preparation Ulcer Cleansing: Other: soap and water, Topical Anesthetic Applied: Other: lidocaine 4%, Treatment Notes Wound #3 (Right Calcaneus) 1. Cleansed with: Clean wound with Normal Saline 2.  Anesthetic Topical Lidocaine 4% cream to wound bed prior to debridement 4. Dressing Applied: Aquacel Ag 5. Secondary Dressing Applied ABD Pad 7. Secured with Tape 3 Layer Compression System - Bilateral Notes xtrasorb Electronic Signature(s) Signed: 12/23/2016 2:54:30 PM By: [name] Entered By: [name] on 12/23/2016 14:01:06 Herandez, Zaiden V. ([id number]) -------------------------------------------------------------------------------- Wound Assessment Details Patient Name: [name] Otto. Date of Service: 12/23/2016 1:30 PM Medical Record Number: [id number] Patient Account Number: [id number] Date of Birth/Sex: Jul 06, 1946 (71 y.o. Male) Treating RN: [name] Primary Care Johnel Yielding: Oretha Milch Other Clinician: Referring Jajuan Skoog: Oretha Milch Treating Adalee Kathan/Extender: Rudene Re in Treatment: 65 Wound Status Wound Number: 9 Primary Diabetic Wound/Ulcer of the Lower Etiology: Extremity Wound Location: Left Lower Leg - Circumfernential Secondary Venous Leg Ulcer Etiology: Wounding Event: Gradually Appeared Wound Open Date Acquired: 08/09/2016 Status: Weeks Of Treatment: 18 Comorbid Cataracts, Chronic Obstructive Clustered Wound: Yes History: Pulmonary Disease (COPD), Sleep Apnea, Angina, Congestive Heart Failure, Hypertension, Type II Diabetes, Gout, Osteoarthritis, Neuropathy Photos Wound Measurements Length: (cm) 12 Width: (cm) 24 Depth: (cm) 0.1 Area: (cm) 226.195 Volume: (cm) 22.619 % Reduction in Area: -125.9% % Reduction in Volume: -125.9% Epithelialization: Large (67-100%) Tunneling: No Undermining: No Wound Description Classification: Grade 2 Wound Margin: Flat and Intact Exudate Amount: Large Exudate Type: Serosanguineous Exudate Color: red, brown Foul Odor After Cleansing: No Slough/Fibrino No Wound Bed Tejera, Haakon V. ([id number]) Granulation Amount: Large (67-100%) Exposed Structure Granulation Quality:  Pink Fascia Exposed: No Necrotic Amount: Small (1-33%) Fat Layer (Subcutaneous Tissue) Exposed: No Necrotic Quality: Adherent Slough Tendon Exposed: No Muscle Exposed: No Joint Exposed: No Bone Exposed: No Limited to Skin Breakdown Periwound Skin Texture Texture Color No Abnormalities Noted: No No Abnormalities Noted: No Callus: No Atrophie Blanche: No Crepitus: No Cyanosis: No Excoriation: No Ecchymosis: No Induration: No Erythema: No Rash: No Hemosiderin Staining: No Scarring: No Mottled: Yes Pallor: No Moisture Rubor: No No Abnormalities Noted: No Dry / Scaly: No Temperature / Pain Maceration: Yes Tenderness on Palpation: Yes Wound Preparation Ulcer Cleansing: Other: soap and water, Topical Anesthetic Applied: None Treatment Notes Wound #9 (Left, Circumferential Lower Leg) 1. Cleansed with: Clean wound with Normal Saline 2. Anesthetic Topical Lidocaine 4% cream to wound bed prior to debridement 4. Dressing Applied: Aquacel Ag 5. Secondary Dressing Applied ABD Pad 7. Secured with Tape 3 Layer Compression System - Bilateral Notes xtrasorb Electronic Signature(s) Signed: 12/23/2016 2:54:30 PM By: [name] Entered By: [name] on 12/23/2016 14:01:52 [name], Abdulhadi V. ([id number]) [name], Theo Seth Bake ([id number]) -------------------------------------------------------------------------------- Vitals Details Patient Name: [name] Otto. Date of Service: 12/23/2016 1:30 PM Medical Record Number: [id number] Patient Account Number: [id number] Date of Birth/Sex: [birth year] (71 y.o. Male) Treating RN: [name] Primary Care Damontre Millea: Oretha Milch Other Clinician: Referring Laderius Valbuena: Katrinka Blazing,  [name] Treating Sae Handrich/Extender: Rudene Re in Treatment: 51 Vital Signs Time Taken: 13:36 Pulse (bpm): 68 Height (in): 70 Respiratory Rate (breaths/min): 18 Weight (lbs): 235 Blood Pressure (mmHg): 127/84 Body Mass Index (BMI): 33.7 Reference  Range: 80 - 120 mg / dl Electronic Signature(s) Signed: 12/23/2016 2:54:30 PM By: [name] Entered By: [name] on 12/23/2016 13:36:30

## 2016-12-30 ENCOUNTER — Ambulatory Visit: Payer: Medicare Other | Admitting: Surgery

## 2017-01-13 ENCOUNTER — Encounter: Payer: Medicare Other | Attending: Surgery | Admitting: Surgery

## 2017-01-13 DIAGNOSIS — L89613 Pressure ulcer of right heel, stage 3: Secondary | ICD-10-CM | POA: Diagnosis not present

## 2017-01-13 DIAGNOSIS — M70871 Other soft tissue disorders related to use, overuse and pressure, right ankle and foot: Secondary | ICD-10-CM | POA: Diagnosis not present

## 2017-01-13 DIAGNOSIS — I110 Hypertensive heart disease with heart failure: Secondary | ICD-10-CM | POA: Diagnosis not present

## 2017-01-13 DIAGNOSIS — I5032 Chronic diastolic (congestive) heart failure: Secondary | ICD-10-CM | POA: Insufficient documentation

## 2017-01-13 DIAGNOSIS — E11621 Type 2 diabetes mellitus with foot ulcer: Secondary | ICD-10-CM | POA: Diagnosis not present

## 2017-01-13 DIAGNOSIS — G4733 Obstructive sleep apnea (adult) (pediatric): Secondary | ICD-10-CM | POA: Insufficient documentation

## 2017-01-13 DIAGNOSIS — Z6833 Body mass index (BMI) 33.0-33.9, adult: Secondary | ICD-10-CM | POA: Insufficient documentation

## 2017-01-13 DIAGNOSIS — I890 Lymphedema, not elsewhere classified: Secondary | ICD-10-CM | POA: Diagnosis not present

## 2017-01-13 DIAGNOSIS — I70234 Atherosclerosis of native arteries of right leg with ulceration of heel and midfoot: Secondary | ICD-10-CM | POA: Insufficient documentation

## 2017-01-13 DIAGNOSIS — J449 Chronic obstructive pulmonary disease, unspecified: Secondary | ICD-10-CM | POA: Diagnosis not present

## 2017-01-13 DIAGNOSIS — Z87891 Personal history of nicotine dependence: Secondary | ICD-10-CM | POA: Insufficient documentation

## 2017-01-13 DIAGNOSIS — I2510 Atherosclerotic heart disease of native coronary artery without angina pectoris: Secondary | ICD-10-CM | POA: Insufficient documentation

## 2017-01-13 NOTE — Progress Notes (Addendum)
JEREMI, LOSITO ([id number]) Visit Report for 01/13/2017 Chief Complaint Document Details Patient Name: [name], [name] Date of Service: 01/13/2017 10:00 AM Medical Record Number: [id number] Patient Account Number: [id number] Date of Birth/Sex: 06/20/1946 (71 y.o. Male) Treating RN: [name] Primary Care Provider: Oretha Milch Other Clinician: Referring Provider: Oretha Milch Treating Provider/Extender: Rudene Re in Treatment: 81 Information Obtained from: Patient Chief Complaint Mr. [name] presents for follow-up on his right heel pressure ulcer and bilateral lower extremity lymphedema and venous ulcerations. Electronic Signature(s) Signed: 01/13/2017 10:55:58 AM By: Evlyn Kanner MD, FACS Entered By: Evlyn Kanner on 01/13/2017 10:55:58 [name] ([id number]) -------------------------------------------------------------------------------- Debridement Details Patient Name: [name] Date of Service: 01/13/2017 10:00 AM Medical Record Number: [id number] Patient Account Number: [id number] Date of Birth/Sex: 1946/09/22 (71 y.o. Male) Treating RN: Huel Coventry Primary Care Provider: Oretha Milch Other Clinician: Referring Provider: Oretha Milch Treating Provider/Extender: Rudene Re in Treatment: 76 Debridement Performed for Wound #3 Right Calcaneus Assessment: Performed By: Physician Evlyn Kanner, MD Debridement: Open Wound/Selective Debridement Selective Description: Pre-procedure Yes - 10:33 Verification/Time Out Taken: Start Time: 10:33 Pain Control: Lidocaine 4% Topical Solution Total Area Debrided (L x 0.5 (cm) x 1 (cm) = 0.5 (cm) W): Tissue and other Viable, Non-Viable, Fibrin/Slough, Skin material debrided: Instrument: Curette Bleeding: None End Time: 10:36 Procedural Pain: 0 Post Procedural Pain: 0 Response to Treatment: Procedure was tolerated well Post Debridement Measurements of Total Wound Length: (cm) 0.5 Stage:  Category/Stage III Width: (cm) 1 Depth: (cm) 0.2 Volume: (cm) 0.079 Character of Wound/Ulcer Post Improved Debridement: Severity of Tissue Post Fat layer exposed Debridement: Post Procedure Diagnosis Same as Pre-procedure Electronic Signature(s) Signed: 02/19/2017 10:41:49 AM By: Elliot Gurney, RN, [name], Kim RN, [name] Signed: 02/20/2017 4:33:23 PM By: Evlyn Kanner MD, FACS Previous Signature: 01/13/2017 10:59:56 AM Version By: Evlyn Kanner MD, FACS Previous Signature: 01/13/2017 2:43:40 PM Version By: Percival Spanish, Doren VMarland Kitchen ([id number]) Entered By: Elliot Gurney RN, [name], Kim on 02/19/2017 10:41:49 [name] ([id number]) -------------------------------------------------------------------------------- HPI Details Patient Name: [name] Date of Service: 01/13/2017 10:00 AM Medical Record Number: [id number] Patient Account Number: [id number] Date of Birth/Sex: 11/24/1945 (71 y.o. Male) Treating RN: [name] Primary Care Provider: Oretha Milch Other Clinician: Referring Provider: Oretha Milch Treating Provider/Extender: Rudene Re in Treatment: 53 History of Present Illness Location: ulcerated area on the right heel, left gluteal region and thigh and then bilateral lower extremities Quality: Patient reports experiencing a sharp pain to affected area(s). Severity: Patient states wound are getting worse. Duration: Patient has had the wound for > 12 months prior to seeking treatment at the wound center Timing: Pain in wound is constant (hurts all the time) Context: The wound appeared gradually over time Modifying Factors: Other treatment(s) tried include: he sees his heart doctor and his primary care doctor Associated Signs and Symptoms: patient has not been able to walk for over a year now HPI Description: 71 year old gentleman with a known history of hypertension, diabetes, obstructive sleep apnea, COPD, diastolic CHF, coronary artery disease was admitted to the  hospital with sepsis from an ulcer of the right heel and was treated there in October 2016. he also has chronic bilateral lower extremity edema and lymphedema. He had received vancomycin, Zosyn and at that stage and x-rays showed hardware in the right ankle but no evidence of osteomyelitis. he was a former smoker. he was also treated with Augmentin orally for 10 days. He is either bedbound or wheelchair-bound and does not ambulate by himself. 01/19/2016 --  he has not been seen here for 3 weeks and this was because he was admitted to [facility] between 223 and 01/08/2016 for sepsis, UTI and pneumonia involving the left lung. he was treated with IV vancomycin and Zosyn and then meropenem. Was discharged home on oral Bactrim for 2 weeks none of his vascular test or x-rays were done and we will reorder these. 01/26/2016 -- he has not yet done the x-ray of his foot and is vascular tests are still pending. I have asked him to work on these with his nursing home staff. Addendum: he has got an x-ray of the right foot done which shows that his osteopenia but no specific ostial lysis or abnormal periosteal reaction. Final impression was degenerative changes with dorsal foot soft tissue swelling. 02/16/2016 -- lower extremity arterial duplex examination shows a 50-99% stenosis of the right tibioperoneal trunk. He had biphasic flow in the right SFA, popliteal and tibioperoneal trunk. Left-sided he had triphasic flow throughout 02/29/2016 -- he is awaiting his vascular opinion with Dr. Gilda Crease which is to be done on April 24 03/08/2016 -- he has not kept his appointment with Dr. Gilda Crease on April 24 and does not seem to know what happened about this. it's difficult to gauge whether he is in full control of his mental faculties as at times he is extremely rude to the nursing staff. 03/22/2016 -- the patient was seen by Dr. 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 Recommended that the patient had severe atherosclerotic changes of both lower extremities associated with ulceration and tissue loss of the foot. This is a limb threatening ischemia and the patient was recommended to undergo Gawthrop, Mena V. ([id number]) angiography of the lower extremity with a hope for intervention for limb salvage. Patient agreed and will proceed to angiography. He was admitted to the hospital between May 2 and 03/15/2016 - he underwent induction of a catheter into his right lower extremity and third order catheter placement with contrast injection to the right lower extremity for distal runoff. Percutaneous transluminal angioplasty of the right superficial femoral and popliteal arteries were done. He also had a right peroneal angioplasty. Patient had a postoperative hematoma and was admitted for observation and in the next 24 hours he was very agitated and combative and had to be seen by psychiatric. He had a follow-up with Dr. Gilda Crease in 3 weeks. 04/18/2016 -- notes reviewed from the vascular office where he was seen for his postop visit after the PTA of the right SFA and popliteal arteries on 03/12/2016. He underwent an ABI which showed right ABI to be more than 1.3 and left to be more than 1 more than 1.3, great toe and PPG waveforms are decreased bilaterally. No additional intervention indicated at this time and the patient was to follow-up in 3 months with an ABI and bilateral lower extremity duplex study. 05/02/2016 -- he complains that his compression stockings are causing him a lot of discomfort and he is not happy wearing them. 05/30/2016 -- the swelling of both legs has increased again and he has had blisters which have opened out into ulcerations. 09/12/2016 -- the lymphedema pumps are still not delivered and hopefully will be done within this week. As far as his  skin substitute goes we are still awaiting a response from his insurance 10-10-2016: Mr. [name] presents today and states that his left edema  pumps have arrived but the he has only received treatment "5 times" since arrival. He also denies using the offloading boot for the right heel but admits to having the boot. He states that staff is hesitant to assist and application of the lymphedema pumps. 10/17/2016 -- I understand he continues to take his diuretics and has been using his lymphedema pump once a day for about an hour. He cannot do it twice a day. 10-31-16 Mr. [name] presents today for follow-up on his bilateral lower extremity venous ulcers and his right posterior heel pressure ulcer. He states that he continues to do his lymphedema pumps once daily, unable to get assistance for twice daily. he does admit to increased drainage to the left leg more than the right leg. He denies any pain, fever, chills or overall ill feeling. 11/08/2016 -- the skin substitute which was run through his insurance company has a high copayment and he is not agreeable about getting this. 11/22/2016 -- he is still not able to use his lymphedema pumps as requested due to lack of help at his living facility. 12/23/2016 -- It was brought to my notice last week, that this patient has been extremely rude and insulting to my staff nurse and she was upset and was in tears. This is not the first time the patient has done this and in the past over several weeks, I have addressed the situation, from time to time. I have personally been insulted by him with rude comments, but I have thought this may be due to an element of dementia. However at this stage, his management is beyond the scope of our practice and I will speak to his nursing supervisor and have them assessed by his PCP for behavioral disorders. We will give him noticed to find a another wound center of his choice and will discharge him from our services. an  Dance movement psychotherapist, under registered mail is going to be sent to him. However, I have personally discussed the aforementioned topic with him and he has had no commands or questions. 01/13/2017 -- since I saw the patient last 3 weeks ago his sister had come to inquire about him and we had Poster, Bryar V. ([id number]) a detailed discussion regarding his behavioral issues and the sister had all her questions answered. Jishnu is back today with the same dressing applied on 3 weeks ago and has been in a subdued mood Electronic Signature(s) Signed: 01/13/2017 10:58:48 AM By: Evlyn Kanner MD, FACS Previous Signature: 01/13/2017 10:56:23 AM Version By: Evlyn Kanner MD, FACS Entered By: Evlyn Kanner on 01/13/2017 10:58:48 [name] ([id number]) -------------------------------------------------------------------------------- Physical Exam Details Patient Name: [name] Date of Service: 01/13/2017 10:00 AM Medical Record Number: [id number] Patient Account Number: [id number] Date of Birth/Sex: February 19, 1946 (72 y.o. Male) Treating RN: [name] Primary Care Provider: Oretha Milch Other Clinician: Referring Provider: Oretha Milch Treating Provider/Extender: Rudene Re in Treatment: 54 Constitutional . Pulse regular. Respirations normal and unlabored. Afebrile. . Eyes Nonicteric. Reactive to light. Ears, Nose, Mouth, and Throat Lips, teeth, and gums WNL.Marland Kitchen Moist mucosa without lesions. Neck supple and nontender. No palpable supraclavicular or cervical adenopathy. Normal sized without goiter. Respiratory WNL. No retractions.. Breath sounds WNL, No rubs, rales, rhonchi, or wheeze.. Cardiovascular Heart rhythm and rate regular, no murmur or gallop.. Pedal Pulses WNL. No clubbing, cyanosis or edema. Chest Breasts symmetical and no nipple discharge.. Breast tissue WNL, no masses, lumps, or tenderness.. Lymphatic No adneopathy. No adenopathy. No adenopathy. Musculoskeletal Adexa  without  tenderness or enlargement.. Digits and nails w/o clubbing, cyanosis, infection, petechiae, ischemia, or inflammatory conditions.. Integumentary (Hair, Skin) No suspicious lesions. No crepitus or fluctuance. No peri-wound warmth or erythema. No masses.Marland Kitchen Psychiatric Judgement and insight Intact.. No evidence of depression, anxiety, or agitation.. Notes the right medial calcaneal wound needed debridement sharply with a #3 curet and minimal bleeding controlled with pressure. His left fifth toe nail has been avulsed and hanging on by minimal subacute tissue and I gently remove this with a gauze and there is no active bleeding The bilateral lower extremity ulcerations look much better and that is good control of his edema Electronic Signature(s) Signed: 01/13/2017 10:59:42 AM By: Evlyn Kanner MD, FACS Entered By: Evlyn Kanner on 01/13/2017 10:59:42 [name] ([id number]) -------------------------------------------------------------------------------- Physician Orders Details Patient Name: [name] Date of Service: 01/13/2017 10:00 AM Medical Record Number: [id number] Patient Account Number: [id number] Date of Birth/Sex: Apr 10, 1946 (71 y.o. Male) Treating RN: [name] Primary Care Provider: Oretha Milch Other Clinician: Referring Provider: Oretha Milch Treating Provider/Extender: Rudene Re in Treatment: 73 Verbal / Phone Orders: No Diagnosis Coding Wound Cleansing Wound #10 Right,Anterior Lower Leg o Clean wound with Normal Saline. - at clinic o Cleanse wound with mild soap and water - at clinic o No tub bath. - only sink bath or bed bath Wound #11 Left Toe Fifth o Clean wound with Normal Saline. - at clinic o Cleanse wound with mild soap and water - at clinic o No tub bath. - only sink bath or bed bath Wound #3 Right Calcaneus o Clean wound with Normal Saline. - at clinic o Cleanse wound with mild soap and water - at clinic o No  tub bath. - only sink bath or bed bath Wound #9 Left,Circumferential Lower Leg o Clean wound with Normal Saline. - at clinic o Cleanse wound with mild soap and water - at clinic o No tub bath. - only sink bath or bed bath Anesthetic Wound #10 Right,Anterior Lower Leg o Topical Lidocaine 4% cream applied to wound bed prior to debridement - for clinic use only Wound #11 Left Toe Fifth o Topical Lidocaine 4% cream applied to wound bed prior to debridement - for clinic use only Wound #3 Right Calcaneus o Topical Lidocaine 4% cream applied to wound bed prior to debridement - for clinic use only Wound #9 Left,Circumferential Lower Leg o Topical Lidocaine 4% cream applied to wound bed prior to debridement - for clinic use only Skin Barriers/Peri-Wound Care Wound #10 Right,Anterior Lower Leg o Antifungal cream - Nystatin [name], [name] ([id number]) o Triamcinolone Acetonide Ointment Wound #11 Left Toe Fifth o Antifungal cream - Nystatin o Triamcinolone Acetonide Ointment Wound #3 Right Calcaneus o Antifungal cream - Nystatin o Triamcinolone Acetonide Ointment Wound #9 Left,Circumferential Lower Leg o Antifungal cream - Nystatin o Triamcinolone Acetonide Ointment Primary Wound Dressing Wound #10 Right,Anterior Lower Leg o Aquacel Ag Wound #11 Left Toe Fifth o Aquacel Ag Wound #3 Right Calcaneus o Aquacel Ag Wound #9 Left,Circumferential Lower Leg o Aquacel Ag Secondary Dressing Wound #10 Right,Anterior Lower Leg o ABD pad o XtraSorb Wound #11 Left Toe Fifth o Other - coverlet Wound #3 Right Calcaneus o ABD pad o XtraSorb Wound #9 Left,Circumferential Lower Leg o ABD pad o XtraSorb Dressing Change Frequency Wound #10 Right,Anterior Lower Leg o Change dressing every week o Other: - SNF to change wrap weekly if patient does not come to his scheduled wound care appointments [name], [name] ([id number]) Wound #3 Right  Calcaneus o Change dressing every week o Other: - SNF to change wrap weekly if patient does not come to his scheduled wound care appointments Wound #9 Left,Circumferential Lower Leg o Change dressing every week o Other: - SNF to change wrap weekly if patient does not come to his scheduled wound care appointments Wound #11 Left Toe Fifth o Change dressing every other day. o Change dressing every week o Other: - SNF to change wrap weekly if patient does not come to his scheduled wound care appointments Follow-up Appointments Wound #10 Right,Anterior Lower Leg o Return Appointment in 1 week. Wound #11 Left Toe Fifth o Return Appointment in 1 week. Wound #3 Right Calcaneus o Return Appointment in 1 week. Wound #9 Left,Circumferential Lower Leg o Return Appointment in 1 week. Edema Control Wound #10 Right,Anterior Lower Leg o 3 Layer Compression System - Bilateral o Compression Pump: Use compression pump on left lower extremity for 30 minutes, twice daily. o Compression Pump: Use compression pump on right lower extremity for 30 minutes, twice daily. o Other: - unna to anchor Wound #11 Left Toe Fifth o 3 Layer Compression System - Bilateral o Compression Pump: Use compression pump on left lower extremity for 30 minutes, twice daily. o Compression Pump: Use compression pump on right lower extremity for 30 minutes, twice daily. o Other: - unna to anchor Wound #3 Right Calcaneus o 3 Layer Compression System - Bilateral Payne, Tallie V. ([id number]) o Compression Pump: Use compression pump on left lower extremity for 30 minutes, twice daily. o Compression Pump: Use compression pump on right lower extremity for 30 minutes, twice daily. o Other: - unna to anchor Off-Loading Wound #10 Right,Anterior Lower Leg o Turn and reposition every 2 hours o Other: - sage boots at night and float heel when lying in bed *******lymphedema pumps  to be placed on pt twice a day for one hour sessions******** Wound #11 Left Toe Fifth o Turn and reposition every 2 hours o Other: - sage boots at night and float heel when lying in bed *******lymphedema pumps to be placed on pt twice a day for one hour sessions******** Wound #3 Right Calcaneus o Turn and reposition every 2 hours o Other: - sage boots at night and float heel when lying in bed *******lymphedema pumps to be placed on pt twice a day for one hour sessions******** Wound #9 Left,Circumferential Lower Leg o Turn and reposition every 2 hours o Other: - sage boots at night and float heel when lying in bed *******lymphedema pumps to be placed on pt twice a day for one hour sessions******** Additional Orders / Instructions Wound #10 Right,Anterior Lower Leg o Increase protein intake. Wound #11 Left Toe Fifth o Increase protein intake. Wound #3 Right Calcaneus o Increase protein intake. Wound #9 Left,Circumferential Lower Leg o Increase protein intake. Medications-please add to medication list. Wound #10 Right,Anterior Lower Leg o Other: - Vitamin C, Vitamin A, Zinc, multivitamin [name], [name] ([id number]) Wound #11 Left Toe Fifth o Other: - Vitamin C, Vitamin A, Zinc, multivitamin Wound #3 Right Calcaneus o Other: - Vitamin C, Vitamin A, Zinc, multivitamin Wound #9 Left,Circumferential Lower Leg o Other: - Vitamin C, Vitamin A, Zinc, multivitamin Electronic Signature(s) Signed: 01/13/2017 2:21:38 PM By: Evlyn KannerBritto, Monchel Pollitt MD, FACS Signed: 01/13/2017 2:43:40 PM By: Curtis Sitesorthy, Joanna Entered By: Curtis Sitesorthy, Joanna on 01/13/2017 10:40:47 [name] OttoLINDLEY, Tyrece V. ([id number]) -------------------------------------------------------------------------------- Problem List Details Patient Name: [name] OttoLINDLEY, Devyon V. Date of Service: 01/13/2017 10:00 AM Medical Record Number: [id number] Patient Account Number: [id number] Date  of Birth/Sex: 05-09-1946 (71 y.o. Male) Treating  RN: [name] Primary Care Provider: Oretha Milch Other Clinician: Referring Provider: Oretha Milch Treating Provider/Extender: Rudene Re in Treatment: 69 Active Problems ICD-10 Encounter Code Description Active Date Diagnosis E11.621 Type 2 diabetes mellitus with foot ulcer 01/01/2016 Yes L89.613 Pressure ulcer of right heel, stage 3 01/01/2016 Yes I89.0 Lymphedema, not elsewhere classified 01/01/2016 Yes E66.01 Morbid (severe) obesity due to excess calories 01/01/2016 Yes M70.871 Other soft tissue disorders related to use, overuse and 01/19/2016 Yes pressure, right ankle and foot 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/16/2016 Yes ulceration of heel and midfoot Inactive Problems Resolved Problems ICD-10 Code Description Active Date Resolved Date L89.322 Pressure ulcer of left buttock, stage 2 01/01/2016 01/01/2016 Electronic Signature(s) Signed: 01/13/2017 10:55:39 AM By: Evlyn Kanner MD, FACS Entered By: Evlyn Kanner on 01/13/2017 10:55:39 [name] ([id number]) [name] Guy, Torry VMarland Kitchen ([id number]) -------------------------------------------------------------------------------- Progress Note Details Patient Name: [name] Date of Service: 01/13/2017 10:00 AM Medical Record Number: [id number] Patient Account Number: [id number] Date of Birth/Sex: [birth year] (71 y.o. Male) Treating RN: [name] Primary Care Provider: Oretha Milch Other Clinician: Referring Provider: Oretha Milch Treating Provider/Extender: Rudene Re in Treatment: 37 Subjective Chief Complaint Information obtained from Patient Mr. Staton presents for follow-up on his right heel pressure ulcer and bilateral lower extremity lymphedema and venous ulcerations. History of Present Illness (HPI) The following HPI elements were documented for the patient's wound: Location: ulcerated area on the right heel, left gluteal region and thigh and then bilateral lower  extremities Quality: Patient reports experiencing a sharp pain to affected area(s). Severity: Patient states wound are getting worse. Duration: Patient has had the wound for > 12 months prior to seeking treatment at the wound center Timing: Pain in wound is constant (hurts all the time) Context: The wound appeared gradually over time Modifying Factors: Other treatment(s) tried include: he sees his heart doctor and his primary care doctor Associated Signs and Symptoms: patient has not been able to walk for over a year now 71 year old gentleman with a known history of hypertension, diabetes, obstructive sleep apnea, COPD, diastolic CHF, coronary artery disease was admitted to the hospital with sepsis from an ulcer of the right heel and was treated there in October 2016. he also has chronic bilateral lower extremity edema and lymphedema. He had received vancomycin, Zosyn and at that stage and x-rays showed hardware in the right ankle but no evidence of osteomyelitis. he was a former smoker. he was also treated with Augmentin orally for 10 days. He is either bedbound or wheelchair-bound and does not ambulate by himself. 01/19/2016 -- he has not been seen here for 3 weeks and this was because he was admitted to [facility] between 223 and 01/08/2016 for sepsis, UTI and pneumonia involving the left lung. he was treated with IV vancomycin and Zosyn and then meropenem. Was discharged home on oral Bactrim for 2 weeks none of his vascular test or x-rays were done and we will reorder these. 01/26/2016 -- he has not yet done the x-ray of his foot and is vascular tests are still pending. I have asked him to work on these with his nursing home staff. Addendum: he has got an x-ray of the right foot done which shows that his osteopenia but no specific ostial lysis or abnormal periosteal reaction. Final impression was degenerative changes with dorsal foot soft tissue swelling. 02/16/2016  -- lower extremity arterial duplex examination shows a 50-99% stenosis  of the right tibioperoneal trunk. He had biphasic flow in the right SFA, popliteal and tibioperoneal trunk. Left-sided he had triphasic flow throughout [name], [name] ([id number]) 02/29/2016 -- he is awaiting his vascular opinion with Dr. Gilda Crease which is to be done on April 24 03/08/2016 -- he has not kept his appointment with Dr. Gilda Crease on April 24 and does not seem to know what happened about this. it's difficult to gauge whether he is in full control of his mental faculties as at times he is extremely rude to the nursing staff. 03/22/2016 -- the patient was seen by Dr. 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 Recommended that the patient had severe atherosclerotic changes of both lower extremities associated with ulceration and tissue loss of the foot. This is a limb threatening ischemia and the patient was recommended to undergo angiography of the lower extremity with a hope for intervention for limb salvage. Patient agreed and will proceed to angiography. He was admitted to the hospital between May 2 and 03/15/2016 - he underwent induction of a catheter into his right lower extremity and third order catheter placement with contrast injection to the right lower extremity for distal runoff. Percutaneous transluminal angioplasty of the right superficial femoral and popliteal arteries were done. He also had a right peroneal angioplasty. Patient had a postoperative hematoma and was admitted for observation and in the next 24 hours he was very agitated and combative and had to be seen by psychiatric. He had a follow-up with Dr. Gilda Crease in 3 weeks. 04/18/2016 -- notes reviewed from the vascular office where he was seen for his postop visit after the PTA of the right SFA and popliteal arteries on 03/12/2016.  He underwent an ABI which showed right ABI to be more than 1.3 and left to be more than 1 more than 1.3, great toe and PPG waveforms are decreased bilaterally. No additional intervention indicated at this time and the patient was to follow-up in 3 months with an ABI and bilateral lower extremity duplex study. 05/02/2016 -- he complains that his compression stockings are causing him a lot of discomfort and he is not happy wearing them. 05/30/2016 -- the swelling of both legs has increased again and he has had blisters which have opened out into ulcerations. 09/12/2016 -- the lymphedema pumps are still not delivered and hopefully will be done within this week. As far as his skin substitute goes we are still awaiting a response from his insurance 10-10-2016: Mr. Nine presents today and states that his left edema pumps have arrived but the he has only received treatment "5 times" since arrival. He also denies using the offloading boot for the right heel but admits to having the boot. He states that staff is hesitant to assist and application of the lymphedema pumps. 10/17/2016 -- I understand he continues to take his diuretics and has been using his lymphedema pump once a day for about an hour. He cannot do it twice a day. 10-31-16 Mr. Kerney presents today for follow-up on his bilateral lower extremity venous ulcers and his right posterior heel pressure ulcer. He states that he continues to do his lymphedema pumps once daily, unable to get assistance for twice daily. he does admit to increased drainage to the left leg more than the right leg. He denies any pain, fever, chills or overall ill feeling. 11/08/2016 -- the skin substitute which was  run through his insurance company has a high copayment and he is not agreeable about getting this. 11/22/2016 -- he is still not able to use his lymphedema pumps as requested due to lack of help at his living facility. 12/23/2016 -- It was brought to my  notice last week, that this patient has been extremely rude and insulting to my staff nurse and she was upset and was in tears. This is not the first time the patient has done this and [name], [name] ([id number]) in the past over several weeks, I have addressed the situation, from time to time. I have personally been insulted by him with rude comments, but I have thought this may be due to an element of dementia. However at this stage, his management is beyond the scope of our practice and I will speak to his nursing supervisor and have them assessed by his PCP for behavioral disorders. We will give him noticed to find a another wound center of his choice and will discharge him from our services. an Dance movement psychotherapist, under registered mail is going to be sent to him. However, I have personally discussed the aforementioned topic with him and he has had no commands or questions. 01/13/2017 -- since I saw the patient last 3 weeks ago his sister had come to inquire about him and we had a detailed discussion regarding his behavioral issues and the sister had all her questions answered. Joaopedro is back today with the same dressing applied on 3 weeks ago and has been in a subdued mood Objective Constitutional Pulse regular. Respirations normal and unlabored. Afebrile. Vitals Time Taken: 10:06 AM, Height: 70 in, Weight: 235 lbs, BMI: 33.7, Pulse: 59 bpm, Respiratory Rate: 18 breaths/min, Blood Pressure: 114/71 mmHg. Eyes Nonicteric. Reactive to light. Ears, Nose, Mouth, and Throat Lips, teeth, and gums WNL.Marland Kitchen Moist mucosa without lesions. Neck supple and nontender. No palpable supraclavicular or cervical adenopathy. Normal sized without goiter. Respiratory WNL. No retractions.. Breath sounds WNL, No rubs, rales, rhonchi, or wheeze.. Cardiovascular Heart rhythm and rate regular, no murmur or gallop.. Pedal Pulses WNL. No clubbing, cyanosis or edema. Chest Breasts symmetical and no nipple discharge..  Breast tissue WNL, no masses, lumps, or tenderness.. Lymphatic No adneopathy. No adenopathy. No adenopathy. Musculoskeletal Adexa without tenderness or enlargement.. Digits and nails w/o clubbing, cyanosis, infection, petechiae, ischemia, or inflammatory conditions.Marland Kitchen [name], [name] ([id number]) Psychiatric Judgement and insight Intact.. No evidence of depression, anxiety, or agitation.. General Notes: the right medial calcaneal wound needed debridement sharply with a #3 curet and minimal bleeding controlled with pressure. His left fifth toe nail has been avulsed and hanging on by minimal subacute tissue and I gently remove this with a gauze and there is no active bleeding The bilateral lower extremity ulcerations look much better and that is good control of his edema Integumentary (Hair, Skin) No suspicious lesions. No crepitus or fluctuance. No peri-wound warmth or erythema. No masses.. Wound #10 status is Open. Original cause of wound was Blister. The wound is located on the Right,Anterior Lower Leg. The wound measures 9cm length x 10cm width x 0.1cm depth; 70.686cm^2 area and 7.069cm^3 volume. The wound is limited to skin breakdown. There is no tunneling or undermining noted. There is a large amount of serosanguineous drainage noted. The wound margin is flat and intact. There is large (67-100%) red granulation within the wound bed. There is a small (1-33%) amount of necrotic tissue within the wound bed including Adherent Slough. The periwound skin appearance did not  exhibit: Callus, Crepitus, Excoriation, Induration, Rash, Scarring, Dry/Scaly, Maceration, Atrophie Blanche, Cyanosis, Ecchymosis, Hemosiderin Staining, Mottled, Pallor, Rubor, Erythema. Periwound temperature was noted as No Abnormality. The periwound has tenderness on palpation. Wound #11 status is Open. Original cause of wound was Trauma. The wound is located on the Left Toe Fifth. The wound measures 0.5cm length x 0.5cm  width x 0.1cm depth; 0.196cm^2 area and 0.02cm^3 volume. The wound is limited to skin breakdown. There is no tunneling or undermining noted. There is a large amount of serous drainage noted. The wound margin is flat and intact. There is no granulation within the wound bed. There is no necrotic tissue within the wound bed. The periwound skin appearance exhibited: Erythema. The periwound skin appearance did not exhibit: Callus, Crepitus, Excoriation, Induration, Rash, Scarring, Dry/Scaly, Maceration, Atrophie Blanche, Cyanosis, Ecchymosis, Hemosiderin Staining, Mottled, Pallor, Rubor. The surrounding wound skin color is noted with erythema which is circumferential. Periwound temperature was noted as No Abnormality. Wound #3 status is Open. Original cause of wound was Pressure Injury. The wound is located on the Right Calcaneus. The wound measures 0.5cm length x 1cm width x 0.2cm depth; 0.393cm^2 area and 0.079cm^3 volume. The wound is limited to skin breakdown. There is no tunneling or undermining noted. There is a large amount of serous drainage noted. The wound margin is flat and intact. There is medium (34-66%) pink granulation within the wound bed. There is a medium (34-66%) amount of necrotic tissue within the wound bed including Adherent Slough. The periwound skin appearance exhibited: Maceration. The periwound skin appearance did not exhibit: Callus, Crepitus, Excoriation, Induration, Rash, Scarring, Dry/Scaly, Atrophie Blanche, Cyanosis, Ecchymosis, Hemosiderin Staining, Mottled, Pallor, Rubor, Erythema. The periwound has tenderness on palpation. Wound #9 status is Open. Original cause of wound was Gradually Appeared. The wound is located on the Left,Circumferential Lower Leg. The wound measures 12cm length x 16cm width x 0.1cm depth; 150.796cm^2 area and 15.08cm^3 volume. The wound is limited to skin breakdown. There is no tunneling or undermining noted. There is a large amount of  serosanguineous drainage noted. The wound margin is flat and intact. There is large (67-100%) pink granulation within the wound bed. There is a small (1-33%) amount of necrotic tissue within the wound bed including Adherent Slough. The periwound skin appearance exhibited: Maceration, Mottled. The periwound skin appearance did not exhibit: Callus, Crepitus, Excoriation, Induration, Rash, Scarring, Dry/Scaly, Atrophie Blanche, Cyanosis, Ecchymosis, Hemosiderin [name], [name] ([id number]) Staining, Pallor, Rubor, Erythema. The periwound has tenderness on palpation. 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 There was a Skin/Subcutaneous Tissue Debridement ([id number]) debridement with total area of 0.5 sq cm performed by Evlyn Kanner, MD. with the following instrument(s): Curette to remove Viable and Non-Viable tissue/material including Fibrin/Slough and Skin after achieving pain control using Lidocaine 4% Topical Solution. A time out was conducted at 10:33, prior to the start of the procedure. There was no bleeding. The procedure was tolerated well with a pain level of 0 throughout and a pain level of 0 following the procedure. Post Debridement Measurements: 0.5cm length x 1cm  width x 0.2cm depth; 0.079cm^3 volume. Post debridement Stage noted as Category/Stage III. Character of Wound/Ulcer Post Debridement is improved. Severity of Tissue Post Debridement is: Fat layer exposed. Post procedure Diagnosis Wound #3: Same as  Pre-Procedure Plan Wound Cleansing: Wound #10 Right,Anterior Lower Leg: Clean wound with Normal Saline. - at clinic Cleanse wound with mild soap and water - at clinic No tub bath. - only sink bath or bed bath Early, Daeshaun V. ([id number]) Wound #11 Left Toe Fifth: Clean wound with Normal Saline. - at clinic Cleanse wound with mild soap and water - at clinic No tub bath. - only sink bath or bed bath Wound #3 Right Calcaneus: Clean wound with Normal Saline. - at clinic Cleanse wound with mild soap and water - at clinic No tub bath. - only sink bath or bed bath Wound #9 Left,Circumferential Lower Leg: Clean wound with Normal Saline. - at clinic Cleanse wound with mild soap and water - at clinic No tub bath. - only sink bath or bed bath Anesthetic: Wound #10 Right,Anterior Lower Leg: Topical Lidocaine 4% cream applied to wound bed prior to debridement - for clinic use only Wound #11 Left Toe Fifth: Topical Lidocaine 4% cream applied to wound bed prior to debridement - for clinic use only Wound #3 Right Calcaneus: Topical Lidocaine 4% cream applied to wound bed prior to debridement - for clinic use only Wound #9 Left,Circumferential Lower Leg: Topical Lidocaine 4% cream applied to wound bed prior to debridement - for clinic use only Skin Barriers/Peri-Wound Care: Wound #10 Right,Anterior Lower Leg: Antifungal cream - Nystatin Triamcinolone Acetonide Ointment Wound #11 Left Toe Fifth: Antifungal cream - Nystatin Triamcinolone Acetonide Ointment Wound #3 Right Calcaneus: Antifungal cream - Nystatin Triamcinolone Acetonide Ointment Wound #9 Left,Circumferential Lower Leg: Antifungal cream - Nystatin Triamcinolone Acetonide Ointment Primary Wound Dressing: Wound #10 Right,Anterior Lower Leg: Aquacel Ag Wound #11 Left Toe Fifth: Aquacel Ag Wound #3 Right Calcaneus: Aquacel Ag Wound #9 Left,Circumferential Lower Leg: Aquacel Ag Secondary Dressing: Wound #10 Right,Anterior  Lower Leg: ABD pad XtraSorb Wound #11 Left Toe Fifth: Other - coverlet Wound #3 Right Calcaneus: ABD pad [name], [name] ([id number]) XtraSorb Wound #9 Left,Circumferential Lower Leg: ABD pad XtraSorb Dressing Change Frequency: Wound #10 Right,Anterior Lower Leg: Change dressing every week Other: - SNF to change wrap weekly if patient does not come to his scheduled wound care appointments Wound #3 Right Calcaneus: Change dressing every week Other: - SNF to change wrap weekly if patient does not come to his scheduled wound care appointments Wound #9 Left,Circumferential Lower Leg: Change dressing every week Other: - SNF to change wrap weekly if patient does not come to his scheduled wound care appointments Wound #11 Left Toe Fifth: Change dressing every other day. Change dressing every week Other: - SNF to change wrap weekly if patient does not come to his scheduled wound care appointments Follow-up Appointments: Wound #10 Right,Anterior Lower Leg: Return Appointment in 1 week. Wound #11 Left Toe Fifth: Return Appointment in 1 week. Wound #3 Right Calcaneus: Return Appointment in 1 week. Wound #9 Left,Circumferential Lower Leg: Return Appointment in 1 week. Edema Control: Wound #10 Right,Anterior Lower Leg: 3 Layer Compression System - Bilateral Compression Pump: Use compression pump on left lower extremity for 30 minutes, twice daily. Compression Pump: Use compression pump on right lower extremity for 30 minutes, twice daily. Other: - unna to anchor Wound #11 Left Toe Fifth: 3 Layer Compression System - Bilateral Compression Pump: Use compression pump on left lower extremity  for 30 minutes, twice daily. Compression Pump: Use compression pump on right lower extremity for 30 minutes, twice daily. Other: - unna to anchor Wound #3 Right Calcaneus: 3 Layer Compression System - Bilateral Compression Pump: Use compression pump on left lower extremity for 30 minutes, twice  daily. Compression Pump: Use compression pump on right lower extremity for 30 minutes, twice daily. Other: - unna to anchor Off-Loading: Wound #10 Right,Anterior Lower Leg: Turn and reposition every 2 hours Other: - sage boots at night and float heel when lying in bed *******lymphedema pumps to be placed on pt twice a day for one hour sessions******** Wound #11 Left Toe Fifth: Turn and reposition every 2 hours Other: - sage boots at night and float heel when lying in bed *******lymphedema pumps to be placed on pt [name], [name] ([id number]) twice a day for one hour sessions******** Wound #3 Right Calcaneus: Turn and reposition every 2 hours Other: - sage boots at night and float heel when lying in bed *******lymphedema pumps to be placed on pt twice a day for one hour sessions******** Wound #9 Left,Circumferential Lower Leg: Turn and reposition every 2 hours Other: - sage boots at night and float heel when lying in bed *******lymphedema pumps to be placed on pt twice a day for one hour sessions******** Additional Orders / Instructions: Wound #10 Right,Anterior Lower Leg: Increase protein intake. Wound #11 Left Toe Fifth: Increase protein intake. Wound #3 Right Calcaneus: Increase protein intake. Wound #9 Left,Circumferential Lower Leg: Increase protein intake. Medications-please add to medication list.: Wound #10 Right,Anterior Lower Leg: Other: - Vitamin C, Vitamin A, Zinc, multivitamin Wound #11 Left Toe Fifth: Other: - Vitamin C, Vitamin A, Zinc, multivitamin Wound #3 Right Calcaneus: Other: - Vitamin C, Vitamin A, Zinc, multivitamin Wound #9 Left,Circumferential Lower Leg: Other: - Vitamin C, Vitamin A, Zinc, multivitamin I have recommended : 1. using Aquacell ag and Drawtex for his right heel to be changed every week. we'll also put a small piece of silver alginate on his left fifth toe where the nail was just removed 2. have urged him to take his diuretic  regularly 3. he has received his lymphedema pumps but he is finding it difficult to get someone to help him put these on and off at his nursing home and we will attempt to talk to the nursing supervisor there. at present he is using them once a day only. 4. We will continue with silver alginate and a 3 layer Profore. Electronic Signature(s) Signed: 01/13/2017 11:01:27 AM By: Evlyn Kanner MD, FACS Entered By: Evlyn Kanner on 01/13/2017 11:01:27 [name] ([id number]) -------------------------------------------------------------------------------- SuperBill Details Patient Name: [name] Date of Service: 01/13/2017 Medical Record Number: [id number] Patient Account Number: [id number] Date of Birth/Sex: 13-Jun-1946 (71 y.o. 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 Facility Procedures CPT4 Code: [id number] Description: [phone] - DEBRIDE WOUND 1ST 20 SQ CM OR < ICD-10 Description Diagnosis E11.621 Type 2 diabetes mellitus with foot ulcer L89.613 Pressure ulcer of right heel, stage 3 I89.0 Lymphedema, not elsewhere  classified Modifier: Quantity: 1 Physician Procedures CPT4 Code: [id number] Description: 97597 - WC PHYS DEBR WO ANESTH 20 SQ CM ICD-10 Description Diagnosis E11.621 Type 2 diabetes mellitus with foot ulcer L89.613 Pressure ulcer of right heel, stage 3 I89.0 Lymphedema, not elsewhere  classified Modifier: Quantity: 1 Electronic Signature(s) Signed: 02/19/2017 10:42:22 AM By: Elliot Gurney RN, [name], Kim RN, [name] Signed: 02/20/2017 4:33:23 PM By: Evlyn Kanner MD, FACS Previous Signature: 01/13/2017 11:01:47 AM Version By: Evlyn Kanner MD, FACS Entered By: Elliot Gurney, RN, [name], Kim on 02/19/2017 10:42:22

## 2017-01-13 NOTE — Progress Notes (Signed)
[name], Brigido V. ([id number]) Visit Report for 01/13/2017 Arrival Information Details Patient Name: [name], Ferdie V. Date of Service: 01/13/2017 10:00 AM Medical Record Number: [id number] Patient Account Number: [id number] Date of Birth/Sex: 29-Sep-1946 9(71 y.o. Male) Treating RN: Curtis Sitesorthy, Joanna Primary Care Crescentia Boutwell: Oretha MilchSMITH, [name] Other Clinician: Referring Maisee Vollman: Oretha MilchSMITH, [name] Treating Zaliah Wissner/Extender: Rudene ReBritto, Errol Weeks in Treatment: 5854 Visit Information History Since Last Visit Added or deleted any medications: No Patient Arrived: Wheel Chair Any new allergies or adverse reactions: No Arrival Time: 10:03 Had a fall or experienced change in No activities of daily living that may affect Accompanied By: self risk of falls: Transfer Assistance: None Signs or symptoms of abuse/neglect since last No Patient Identification Verified: Yes visito Secondary Verification Process Yes Hospitalized since last visit: No Completed: Has Dressing in Place as Prescribed: Yes Patient Requires Transmission-Based No Has Compression in Place as Prescribed: Yes Precautions: Pain Present Now: No Patient Has Alerts: Yes Patient Alerts: DM II Electronic Signature(s) Signed: 01/13/2017 2:43:40 PM By: Curtis Sitesorthy, Joanna Entered By: Curtis Sitesorthy, Joanna on 01/13/2017 10:06:04 [name], Kabeer V. ([id number]) -------------------------------------------------------------------------------- Encounter Discharge Information Details Patient Name: [name], Savyon V. Date of Service: 01/13/2017 10:00 AM Medical Record Number: [id number] Patient Account Number: [id number] Date of Birth/Sex: 29-Sep-1946 [age] y.o. Male) Treating RN: Curtis Sitesorthy, Joanna Primary Care Royce Sciara: Oretha MilchSMITH, [name] Other Clinician: Referring Vada Yellen: Oretha MilchSMITH, [name] Treating Tyrik Stetzer/Extender: Rudene ReBritto, Errol Weeks in Treatment: 2454 Encounter Discharge Information Items Discharge Pain Level: 0 Discharge Condition: Stable Ambulatory Status: Wheelchair Discharge  Destination: Nursing Home Transportation: Private Auto Accompanied By: self Schedule Follow-up Appointment: Yes Medication Reconciliation completed No and provided to Patient/Care Edder Bellanca: Provided on Clinical Summary of Care: 01/13/2017 Form Type Recipient Paper Patient EL Electronic Signature(s) Signed: 01/13/2017 11:26:39 AM By: Curtis Sitesorthy, Joanna Previous Signature: 01/13/2017 11:05:12 AM Version By: Gwenlyn PerkingMoore, Shelia Entered By: Curtis Sitesorthy, Joanna on 01/13/2017 11:26:38 [name], Bernard V. ([id number]) -------------------------------------------------------------------------------- Lower Extremity Assessment Details Patient Name: [name], Burgess V. Date of Service: 01/13/2017 10:00 AM Medical Record Number: [id number] Patient Account Number: [id number] Date of Birth/Sex: 29-Sep-1946 24(71 y.o. Male) Treating RN: Curtis Sitesorthy, Joanna Primary Care Xyla Leisner: Oretha MilchSMITH, [name] Other Clinician: Referring Quavion Boule: Oretha MilchSMITH, [name] Treating Jaren Vanetten/Extender: Rudene ReBritto, Errol Weeks in Treatment: 2154 Edema Assessment Assessed: [Left: No] [Right: No] E[Left: dema] [Right: :] Calf Left: Right: Point of Measurement: 34 cm From Medial Instep 38.5 cm 35.5 cm Ankle Left: Right: Point of Measurement: 12 cm From Medial Instep 25.8 cm 23.6 cm Vascular Assessment Pulses: Dorsalis Pedis Palpable: [Left:Yes] [Right:Yes] Posterior Tibial Extremity colors, hair growth, and conditions: Extremity Color: [Left:Hyperpigmented] [Right:Hyperpigmented] Hair Growth on Extremity: [Left:No] [Right:No] Temperature of Extremity: [Left:Warm] [Right:Warm] Capillary Refill: [Left:< 3 seconds] [Right:< 3 seconds] Electronic Signature(s) Signed: 01/13/2017 2:43:40 PM By: Curtis Sitesorthy, Joanna Entered By: Curtis Sitesorthy, Joanna on 01/13/2017 10:17:28 [name], Antrell V. ([id number]) -------------------------------------------------------------------------------- Multi Wound Chart Details Patient Name: [name], Rad V. Date of Service: 01/13/2017 10:00 AM Medical  Record Number: [id number] Patient Account Number: [id number] Date of Birth/Sex: 29-Sep-1946 4(71 y.o. Male) Treating RN: Curtis Sitesorthy, Joanna Primary Care Shem Plemmons: Oretha MilchSMITH, [name] Other Clinician: Referring Itsel Opfer: Oretha MilchSMITH, [name] Treating Sajan Cheatwood/Extender: Rudene ReBritto, Errol Weeks in Treatment: 3854 Vital Signs Height(in): 70 Pulse(bpm): 59 Weight(lbs): 235 Blood Pressure 114/71 (mmHg): Body Mass Index(BMI): 34 Temperature(F): Respiratory Rate 18 (breaths/min): Photos: Wound Location: Right Lower Leg - Anterior Left Toe Fifth Right Calcaneus Wounding Event: Blister Trauma Pressure Injury Primary Etiology: Lymphedema Trauma, Other Pressure Ulcer Secondary Etiology: Diabetic Wound/Ulcer of N/A N/A the Lower Extremity Comorbid History: Cataracts, Chronic Cataracts, Chronic Cataracts, Chronic Obstructive Pulmonary Obstructive Pulmonary Obstructive Pulmonary Disease (COPD),  Sleep Disease (COPD), Sleep Disease (COPD), Sleep Apnea, Angina, Apnea, Angina, Apnea, Angina, Congestive Heart Failure, Congestive Heart Failure, Congestive Heart Failure, Hypertension, Type II Hypertension, Type II Hypertension, Type II Diabetes, Gout, Diabetes, Gout, Diabetes, Gout, Osteoarthritis, Neuropathy Osteoarthritis, Neuropathy Osteoarthritis, Neuropathy Date Acquired: 08/22/2016 01/06/2017 10/31/2015 Weeks of Treatment: 20 0 54 Wound Status: Open Open Open Clustered Wound: No No No Measurements L x W x D 9x10x0.1 0.5x0.5x0.1 0.5x1x0.2 (cm) Area (cm) : 70.686 0.196 0.393 Volume (cm) : 7.069 0.02 0.079 % Reduction in Area: -5900.50% N/A 94.40% % Reduction in Volume: -5890.70% N/A 94.40% Renville, Cable V. ([id number]) Classification: Partial Thickness Partial Thickness Category/Stage III HBO Classification: Grade 1 Grade 1 Grade 1 Exudate Amount: Large Large Large Exudate Type: Serosanguineous Serous Serous Exudate Color: red, brown amber amber Wound Margin: Flat and Intact Flat and Intact Flat and  Intact Granulation Amount: Large (67-100%) None Present (0%) Medium (34-66%) Granulation Quality: Red N/A Pink Necrotic Amount: Small (1-33%) None Present (0%) Medium (34-66%) Exposed Structures: Fascia: No Fascia: No Fascia: No Fat Layer (Subcutaneous Fat Layer (Subcutaneous Fat Layer (Subcutaneous Tissue) Exposed: No Tissue) Exposed: No Tissue) Exposed: No Tendon: No Tendon: No Tendon: No Muscle: No Muscle: No Muscle: No Joint: No Joint: No Joint: No Bone: No Bone: No Bone: No Limited to Skin Limited to Skin Limited to Skin Breakdown Breakdown Breakdown Epithelialization: Large (67-100%) None None Debridement: N/A N/A Debridement ([id number]) Pre-procedure N/A N/A 10:33 Verification/Time Out Taken: Pain Control: N/A N/A Lidocaine 4% Topical Solution Tissue Debrided: N/A N/A Fibrin/Slough, Skin Level: N/A N/A Skin/Subcutaneous Tissue Debridement Area (sq N/A N/A 0.5 cm): Instrument: N/A N/A Curette Bleeding: N/A N/A None Procedural Pain: N/A N/A 0 Post Procedural Pain: N/A N/A 0 Debridement Treatment N/A N/A Procedure was tolerated Response: well Post Debridement N/A N/A 0.5x1x0.2 Measurements L x W x D (cm) Post Debridement N/A N/A 0.079 Volume: (cm) Post Debridement N/A N/A Category/Stage III Stage: Periwound Skin Texture: Excoriation: No Excoriation: No Excoriation: No Induration: No Induration: No Induration: No Callus: No Callus: No Callus: No Crepitus: No Crepitus: No Crepitus: No Rash: No Rash: No Rash: No Scarring: No Scarring: No Scarring: No Johnsen, Ritchard V. ([id number]) Periwound Skin Maceration: No Maceration: No Maceration: Yes Moisture: Dry/Scaly: No Dry/Scaly: No Dry/Scaly: No Periwound Skin Color: Atrophie Blanche: No Erythema: Yes Atrophie Blanche: No Cyanosis: No Atrophie Blanche: No Cyanosis: No Ecchymosis: No Cyanosis: No Ecchymosis: No Erythema: No Ecchymosis: No Erythema: No Hemosiderin Staining:  No Hemosiderin Staining: No Hemosiderin Staining: No Mottled: No Mottled: No Mottled: No Pallor: No Pallor: No Pallor: No Rubor: No Rubor: No Rubor: No Erythema Location: N/A Circumferential N/A Temperature: No Abnormality No Abnormality N/A Tenderness on Yes No Yes Palpation: Wound Preparation: Ulcer Cleansing: Other: Ulcer Cleansing: Ulcer Cleansing: Other: soap and water Rinsed/Irrigated with soap and water Saline Topical Anesthetic Topical Anesthetic Applied: None Topical Anesthetic Applied: Other: lidocaine Applied: Other: lidocaine 4% 4% Procedures Performed: N/A N/A Debridement Wound Number: 9 N/A N/A Photos: N/A N/A Wound Location: Left Lower Leg - N/A N/A Circumfernential Wounding Event: Gradually Appeared N/A N/A 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 ([id number]) Diabetes, Gout, Osteoarthritis, Neuropathy Date Acquired: 08/09/2016 N/A N/A Weeks of Treatment: 21 N/A N/A Wound Status: Open N/A N/A Clustered Wound: Yes N/A N/A Measurements L x W x D 12x16x0.1 N/A N/A (  cm) Area (cm) : 150.796 N/A N/A Volume (cm) : 15.08 N/A N/A % Reduction in Area: -50.60% N/A N/A % Reduction in Volume: -50.60% N/A N/A Classification: Grade 2 N/A N/A HBO Classification: N/A N/A N/A Exudate Amount: Large N/A N/A Exudate Type: Serosanguineous N/A N/A Exudate Color: red, brown N/A N/A Wound Margin: Flat and Intact N/A N/A Granulation Amount: Large (67-100%) N/A N/A Granulation Quality: Pink N/A N/A Necrotic Amount: Small (1-33%) N/A N/A Exposed Structures: Fascia: No N/A N/A Fat Layer (Subcutaneous Tissue) Exposed: No Tendon: No Muscle: No Joint: No Bone: No Limited to Skin Breakdown Epithelialization: Large  (67-100%) N/A N/A Debridement: N/A N/A N/A Pain Control: N/A N/A N/A Tissue Debrided: N/A N/A N/A Level: N/A N/A N/A Debridement Area (sq N/A N/A N/A cm): Instrument: N/A N/A N/A Bleeding: N/A N/A N/A Procedural Pain: N/A N/A N/A Post Procedural Pain: N/A N/A N/A Debridement Treatment N/A N/A N/A Response: Post Debridement N/A N/A N/A Measurements L x W x D (cm) Post Debridement N/A N/A N/A Volume: (cm) N/A N/A N/A [name] ([id number]) Post Debridement Stage: Periwound Skin Texture: Excoriation: No N/A N/A Induration: No Callus: No Crepitus: No Rash: No Scarring: No Periwound Skin Maceration: Yes N/A N/A Moisture: Dry/Scaly: No Periwound Skin Color: Mottled: Yes N/A N/A Atrophie Blanche: No Cyanosis: No Ecchymosis: No Erythema: No Hemosiderin Staining: No Pallor: No Rubor: No Erythema Location: N/A N/A N/A Temperature: N/A N/A N/A Tenderness on Yes N/A N/A Palpation: Wound Preparation: Ulcer Cleansing: Other: N/A N/A soap and water Topical Anesthetic Applied: None Procedures Performed: N/A N/A N/A Treatment Notes Electronic Signature(s) Signed: 01/13/2017 10:55:45 AM By: Evlyn Kanner MD, FACS Entered By: Evlyn Kanner on 01/13/2017 10:55:45 [name] ([id number]) -------------------------------------------------------------------------------- Multi-Disciplinary Care Plan Details Patient Name: [name] Date of Service: 01/13/2017 10:00 AM Medical Record Number: [id number] Patient Account Number: [id number] Date of Birth/Sex: 05-Feb-1946 (71 y.o. Male) Treating RN: [name] Primary Care Devun Anna: Oretha Milch Other Clinician: Referring Shamica Moree: Oretha Milch Treating Emerie Vanderkolk/Extender: Rudene Re in Treatment: 70 Active Inactive ` Abuse / Safety / Falls / Self Care Management Nursing Diagnoses: Potential for falls Goals: Patient will remain injury free Date Initiated: 03/01/2016 Target Resolution Date:  12/09/2016 Goal Status: Active Interventions: Assess fall risk on admission and as needed Notes: ` Nutrition Nursing Diagnoses: Imbalanced nutrition Goals: Patient/caregiver agrees to and verbalizes understanding of need to use nutritional supplements and/or vitamins as prescribed Date Initiated: 03/01/2016 Target Resolution Date: 12/09/2016 Goal Status: Active Interventions: Assess patient nutrition upon admission and as needed per policy Notes: ` Orientation to the Wound Care Program Nursing Diagnoses: Knowledge deficit related to the wound healing center program DWYANE, DUPREE ([id number]) Goals: Patient/caregiver will verbalize understanding of the Wound Healing Center Program Date Initiated: 03/01/2016 Target Resolution Date: 12/09/2016 Goal Status: Active Interventions: Provide education on orientation to the wound center Notes: ` Pain, Acute or Chronic Nursing Diagnoses: Pain, acute or chronic: actual or potential Potential alteration in comfort, pain Goals: Patient will verbalize adequate pain control and receive pain control interventions during procedures as needed Date Initiated: 03/01/2016 Target Resolution Date: 12/09/2016 Goal Status: Active Patient/caregiver will verbalize adequate pain control between visits Date Initiated: 03/01/2016 Target Resolution Date: 12/09/2016 Goal Status: Active Interventions: Assess comfort goal upon admission Complete pain assessment as per visit requirements Notes: ` Pressure Nursing Diagnoses: Knowledge deficit related to causes and risk factors for pressure ulcer development Knowledge deficit related to management of pressures ulcers Goals: Patient/caregiver will verbalize risk factors for pressure ulcer development  Date Initiated: 03/01/2016 Target Resolution Date: 12/09/2016 Goal Status: Active Interventions: Assess offloading mechanisms upon admission and as needed Jezek, Hodari V.  ([id number]) Notes: ` Wound/Skin Impairment Nursing Diagnoses: Impaired tissue integrity Goals: Ulcer/skin breakdown will have a volume reduction of 30% by week 4 Date Initiated: 03/01/2016 Target Resolution Date: 12/09/2016 Goal Status: Active Ulcer/skin breakdown will have a volume reduction of 50% by week 8 Date Initiated: 03/01/2016 Target Resolution Date: 12/09/2016 Goal Status: Active Ulcer/skin breakdown will have a volume reduction of 80% by week 12 Date Initiated: 03/01/2016 Target Resolution Date: 12/09/2016 Goal Status: Active Interventions: Assess ulceration(s) every visit Notes: Electronic Signature(s) Signed: 01/13/2017 2:43:40 PM By: [name] Entered By: [name] on 01/13/2017 10:34:31 [name] ([id number]) -------------------------------------------------------------------------------- Pain Assessment Details Patient Name: [name] Date of Service: 01/13/2017 10:00 AM Medical Record Number: [id number] Patient Account Number: [id number] Date of Birth/Sex: 04-Oct-1946 (71 y.o. Male) Treating RN: [name] Primary Care Gaberiel Youngblood: Oretha Milch Other Clinician: Referring Valon Glasscock: Oretha Milch Treating Darlen Gledhill/Extender: Rudene Re in Treatment: 79 Active Problems Location of Pain Severity and Description of Pain Patient Has Paino No Site Locations Pain Management and Medication Current Pain Management: Notes Topical or injectable lidocaine is offered to patient for acute pain when surgical debridement is performed. If needed, Patient is instructed to use over the counter pain medication for the following 24-48 hours after debridement. Wound care MDs do not prescribed pain medications. Patient has chronic pain or uncontrolled pain. Patient has been instructed to make an appointment with their Primary Care Physician for pain management. Electronic Signature(s) Signed: 01/13/2017 2:43:40 PM By: [name] Entered By: [name] on 01/13/2017 10:06:14 [name] ([id number]) -------------------------------------------------------------------------------- Patient/Caregiver Education Details Patient Name: [name] Date of Service: 01/13/2017 10:00 AM Medical Record Number: [id number] Patient Account Number: [id number] Date of Birth/Gender: 12-25-1945 (71 y.o. Male) Treating RN: [name] Primary Care Physician: Oretha Milch Other Clinician: Referring Physician: Oretha Milch Treating Physician/Extender: Rudene Re in Treatment: 51 Education Assessment Education Provided To: Patient Education Topics Provided Venous: Handouts: Other: continue using pumps Methods: Explain/Verbal Responses: State content correctly Electronic Signature(s) Signed: 01/13/2017 2:43:40 PM By: [name] Entered By: [name] on 01/13/2017 11:26:55 [name] ([id number]) -------------------------------------------------------------------------------- Wound Assessment Details Patient Name: [name] Date of Service: 01/13/2017 10:00 AM Medical Record Number: [id number] Patient Account Number: [id number] Date of Birth/Sex: 1945-11-17 (71 y.o. Male) Treating RN: [name] Primary Care Kamla Skilton: Oretha Milch Other Clinician: Referring Tanush Drees: Oretha Milch Treating Jaice Lague/Extender: Rudene Re in Treatment: 44 Wound Status Wound Number: 10 Primary Lymphedema Etiology: Wound Location: Right Lower Leg - Anterior Secondary Diabetic Wound/Ulcer of the Lower Wounding Event: Blister Etiology: Extremity Date Acquired: 08/22/2016 Wound Open Weeks Of Treatment: 20 Status: Clustered Wound: No Comorbid Cataracts, Chronic Obstructive History: Pulmonary Disease (COPD), Sleep Apnea, Angina, Congestive Heart Failure, Hypertension, Type II Diabetes, Gout, Osteoarthritis, Neuropathy Photos Wound Measurements Length: (cm) 9 Width: (cm) 10 Depth: (cm) 0.1 Area: (cm)  70.686 Volume: (cm) 7.069 % Reduction in Area: -5900.5% % Reduction in Volume: -5890.7% Epithelialization: Large (67-100%) Tunneling: No Undermining: No Wound Description Classification: Partial Thickness Diabetic Severity [address]): Grade 1 Wound Margin: Flat and Intact Exudate Amount: Large Exudate Type: Serosanguineous Exudate Color: red, brown Wound Bed Prophete, Calyb V. ([id number]) Granulation Amount: Large (67-100%) Exposed Structure Granulation Quality: Red Fascia Exposed: No Necrotic Amount: Small (1-33%) Fat Layer (Subcutaneous Tissue) Exposed: No Necrotic Quality: Adherent Slough Tendon Exposed: No Muscle Exposed: No Joint Exposed: No Bone  Exposed: No Limited to Skin Breakdown Periwound Skin Texture Texture Color No Abnormalities Noted: No No Abnormalities Noted: No Callus: No Atrophie Blanche: No Crepitus: No Cyanosis: No Excoriation: No Ecchymosis: No Induration: No Erythema: No Rash: No Hemosiderin Staining: No Scarring: No Mottled: No Pallor: No Moisture Rubor: No No Abnormalities Noted: No Dry / Scaly: No Temperature / Pain Maceration: No Temperature: No Abnormality Tenderness on Palpation: Yes Wound Preparation Ulcer Cleansing: Other: soap and water, Topical Anesthetic Applied: None Treatment Notes Wound #10 (Right, Anterior Lower Leg) 1. Cleansed with: Cleanse wound with antibacterial soap and water 2. Anesthetic Topical Lidocaine 4% cream to wound bed prior to debridement 4. Dressing Applied: Aquacel Ag 5. Secondary Dressing Applied ABD Pad 7. Secured with Tape 3 Layer Compression System - Bilateral Electronic Signature(s) Signed: 01/13/2017 2:43:40 PM By: [name] Entered By: [name] on 01/13/2017 10:27:22 [name] ([id number]) [name], Jaxsen VMarland Kitchen ([id number]) -------------------------------------------------------------------------------- Wound Assessment Details Patient Name: [name] Date of  Service: 01/13/2017 10:00 AM Medical Record Number: [id number] Patient Account Number: [id number] Date of Birth/Sex: [birth year] (71 y.o. Male) Treating RN: [name] Primary Care Royale Lennartz: Oretha Milch Other Clinician: Referring Rosselyn Martha: Oretha Milch Treating Terron Merfeld/Extender: Rudene Re in Treatment: 37 Wound Status Wound Number: 11 Primary Trauma, Other Etiology: Wound Location: Left Toe Fifth Wound Open Wounding Event: Trauma Status: Date Acquired: 01/06/2017 Comorbid Cataracts, Chronic Obstructive Weeks Of Treatment: 0 History: Pulmonary Disease (COPD), Sleep Clustered Wound: No Apnea, Angina, Congestive Heart Failure, Hypertension, Type II Diabetes, Gout, Osteoarthritis, Neuropathy Photos Wound Measurements Length: (cm) 0.5 Width: (cm) 0.5 Depth: (cm) 0.1 Area: (cm) 0.196 Volume: (cm) 0.02 % Reduction in Area: % Reduction in Volume: Epithelialization: None Tunneling: No Undermining: No Wound Description Classification: Partial Thickness Foul Odor A Diabetic Severity (Wagner): Grade 1 Slough/Fibr Wound Margin: Flat and Intact Exudate Amount: Large Exudate Type: Serous Exudate Color: amber fter Cleansing: No ino No Wound Bed Granulation Amount: None Present (0%) Exposed Structure Necrotic Amount: None Present (0%) Fascia Exposed: No Bayless, Etan V. ([id number]) Fat Layer (Subcutaneous Tissue) Exposed: No Tendon Exposed: No Muscle Exposed: No Joint Exposed: No Bone Exposed: No Limited to Skin Breakdown Periwound Skin Texture Texture Color No Abnormalities Noted: No No Abnormalities Noted: No Callus: No Atrophie Blanche: No Crepitus: No Cyanosis: No Excoriation: No Ecchymosis: No Induration: No Erythema: Yes Rash: No Erythema Location: Circumferential Scarring: No Hemosiderin Staining: No Mottled: No Moisture Pallor: No No Abnormalities Noted: No Rubor: No Dry / Scaly: No Maceration: No Temperature / Pain Temperature: No  Abnormality Wound Preparation Ulcer Cleansing: Rinsed/Irrigated with Saline Topical Anesthetic Applied: Other: lidocaine 4%, Treatment Notes Wound #11 (Left Toe Fifth) 1. Cleansed with: Clean wound with Normal Saline Cleanse wound with antibacterial soap and water 2. Anesthetic Topical Lidocaine 4% cream to wound bed prior to debridement 4. Dressing Applied: Aquacel Ag Other dressing (specify in notes) Notes coverlet Electronic Signature(s) Signed: 01/13/2017 2:43:40 PM By: [name] Entered By: [name] on 01/13/2017 10:26:45 [name] ([id number]) -------------------------------------------------------------------------------- Wound Assessment Details Patient Name: [name] Date of Service: 01/13/2017 10:00 AM Medical Record Number: [id number] Patient Account Number: [id number] Date of Birth/Sex: 1946-08-10 (71 y.o. Male) Treating RN: [name] Primary Care Sohil Timko: Oretha Milch Other Clinician: Referring Luke Rigsbee: Oretha Milch Treating Jeaneane Adamec/Extender: Rudene Re in Treatment: 79 Wound Status Wound Number: 3 Primary Pressure Ulcer Etiology: Wound Location: Right Calcaneus Wound Open Wounding Event: Pressure Injury Status: Date Acquired: 10/31/2015 Comorbid Cataracts, Chronic Obstructive Weeks Of Treatment: 54 History: Pulmonary Disease (  COPD), Sleep Clustered Wound: No Apnea, Angina, Congestive Heart Failure, Hypertension, Type II Diabetes, Gout, Osteoarthritis, Neuropathy Photos Wound Measurements Length: (cm) 0.5 Width: (cm) 1 Depth: (cm) 0.2 Area: (cm) 0.393 Volume: (cm) 0.079 % Reduction in Area: 94.4% % Reduction in Volume: 94.4% Epithelialization: None Tunneling: No Undermining: No Wound Description Classification: Category/Stage III Foul Odor A Diabetic Severity (Wagner): Grade 1 Wound Margin: Flat and Intact Exudate Amount: Large Exudate Type: Serous Exudate Color: amber fter Cleansing: No Wound  Bed Granulation Amount: Medium (34-66%) Exposed Structure Granulation Quality: Pink Fascia Exposed: No Colston, Samie V. ([id number]) Necrotic Amount: Medium (34-66%) Fat Layer (Subcutaneous Tissue) Exposed: No Necrotic Quality: Adherent Slough Tendon Exposed: No Muscle Exposed: No Joint Exposed: No Bone Exposed: No Limited to Skin Breakdown Periwound Skin Texture Texture Color No Abnormalities Noted: No No Abnormalities Noted: No Callus: No Atrophie Blanche: No Crepitus: No Cyanosis: No Excoriation: No Ecchymosis: No Induration: No Erythema: No Rash: No Hemosiderin Staining: No Scarring: No Mottled: No Pallor: No Moisture Rubor: No No Abnormalities Noted: No Dry / Scaly: No Temperature / Pain Maceration: Yes Tenderness on Palpation: Yes Wound Preparation Ulcer Cleansing: Other: soap and water, Topical Anesthetic Applied: Other: lidocaine 4%, Treatment Notes Wound #3 (Right Calcaneus) 1. Cleansed with: Cleanse wound with antibacterial soap and water 2. Anesthetic Topical Lidocaine 4% cream to wound bed prior to debridement 4. Dressing Applied: Aquacel Ag 5. Secondary Dressing Applied ABD Pad 7. Secured with Tape 3 Layer Compression System - Bilateral Electronic Signature(s) Signed: 01/13/2017 2:43:40 PM By: [name] Entered By: [name] on 01/13/2017 10:27:52 [name] ([id number]) -------------------------------------------------------------------------------- Wound Assessment Details Patient Name: [name] Date of Service: 01/13/2017 10:00 AM Medical Record Number: [id number] Patient Account Number: [id number] Date of Birth/Sex: Jul 27, 1946 (71 y.o. 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 Event: Gradually Appeared Wound Open Date Acquired: 08/09/2016 Status: Weeks Of Treatment: 21 Comorbid Cataracts, Chronic Obstructive Clustered Wound: Yes History: Pulmonary Disease (COPD), Sleep Apnea, Angina, Congestive Heart Failure, Hypertension, Type II Diabetes, Gout, Osteoarthritis, Neuropathy Photos Wound Measurements Length: (cm) 12 Width: (cm) 16 Depth: (cm) 0.1 Area: (cm) 150.796 Volume: (cm) 15.08 % Reduction in Area: -50.6% % Reduction in Volume: -50.6% Epithelialization: Large (67-100%) Tunneling: No Undermining: No Wound Description Classification: Grade 2 Wound Margin: Flat and Intact Exudate Amount: Large Exudate Type: Serosanguineous Exudate Color: red, brown Foul Odor After Cleansing: No Slough/Fibrino No Wound Bed Laba, Shed V. ([id number]) Granulation Amount: Large (67-100%) Exposed Structure Granulation Quality: Pink Fascia Exposed: No Necrotic Amount: Small (1-33%) Fat Layer (Subcutaneous Tissue) Exposed: No Necrotic Quality: Adherent Slough Tendon Exposed: No Muscle Exposed: No Joint Exposed: No Bone Exposed: No Limited to Skin Breakdown Periwound Skin Texture Texture Color No Abnormalities Noted: No No Abnormalities Noted: No Callus: No Atrophie Blanche: No Crepitus: No Cyanosis: No Excoriation: No Ecchymosis: No Induration: No Erythema: No Rash: No Hemosiderin Staining: No Scarring: No Mottled: Yes Pallor: No Moisture Rubor: No No Abnormalities Noted: No Dry / Scaly: No Temperature / Pain Maceration: Yes Tenderness on Palpation: Yes Wound Preparation Ulcer Cleansing: Other: soap and water, Topical Anesthetic Applied: None Treatment Notes Wound #9 (Left, Circumferential Lower Leg) 1. Cleansed with: Cleanse wound with antibacterial soap and water 2. Anesthetic Topical Lidocaine 4% cream to wound  bed  prior to debridement 4. Dressing Applied: Aquacel Ag 5. Secondary Dressing Applied ABD Pad 7. Secured with Tape 3 Layer Compression System - Bilateral Electronic Signature(s) Signed: 01/13/2017 2:43:40 PM By: [name] Entered By: [name] on 01/13/2017 10:28:30 [name] ([id number]) -------------------------------------------------------------------------------- Vitals Details Patient Name: [name] Date of Service: 01/13/2017 10:00 AM Medical Record Number: [id number] Patient Account Number: [id number] Date of Birth/Sex: 04/20/1946 (71 y.o. Male) Treating RN: [name] Primary Care Talesha Ellithorpe: Oretha Milch Other Clinician: Referring Shaheen Star: Oretha Milch Treating Gwendolen Hewlett/Extender: Rudene Re in Treatment: 42 Vital Signs Time Taken: 10:06 Pulse (bpm): 59 Height (in): 70 Respiratory Rate (breaths/min): 18 Weight (lbs): 235 Blood Pressure (mmHg): 114/71 Body Mass Index (BMI): 33.7 Reference Range: 80 - 120 mg / dl Electronic Signature(s) Signed: 01/13/2017 2:43:40 PM By: [name] Entered By: [name] on 01/13/2017 10:06:32

## 2017-01-20 ENCOUNTER — Ambulatory Visit: Payer: Medicare Other | Admitting: Surgery

## 2017-01-27 ENCOUNTER — Encounter: Payer: Medicare Other | Admitting: Surgery

## 2017-01-27 DIAGNOSIS — E11621 Type 2 diabetes mellitus with foot ulcer: Secondary | ICD-10-CM | POA: Diagnosis not present

## 2017-01-28 NOTE — Progress Notes (Signed)
[name] ([id number]) Visit Report for 01/27/2017 Arrival Information Details Patient Name: [name], [name] Date of Service: 01/27/2017 10:15 AM Medical Record Number: [id number] Patient Account Number: [id number] Date of Birth/Sex: [birth year] (71 y.o. Male) Treating RN: [name] Primary Care Donshay Lupinski: Oretha Milch Other Clinician: Referring Cesare Sumlin: Oretha Milch Treating Virda Betters/Extender: Rudene Re in Treatment: 70 Visit Information History Since Last Visit Added or deleted any medications: No Patient Arrived: Wheel Chair Any new allergies or adverse reactions: No Arrival Time: 10:20 Had a fall or experienced change in No activities of daily living that may affect Accompanied By: self risk of falls: Transfer Assistance: None Signs or symptoms of abuse/neglect since last No Patient Identification Verified: Yes visito Secondary Verification Process Yes Hospitalized since last visit: No Completed: Has Dressing in Place as Prescribed: Yes Patient Requires Transmission-Based No Has Compression in Place as Prescribed: Yes Precautions: Pain Present Now: No Patient Has Alerts: Yes Patient Alerts: DM II Electronic Signature(s) Signed: 01/27/2017 4:59:17 PM By: [name] Entered By: [name] on 01/27/2017 10:23:40 [name] ([id number]) -------------------------------------------------------------------------------- Encounter Discharge Information Details Patient Name: [name] Date of Service: 01/27/2017 10:15 AM Medical Record Number: [id number] Patient Account Number: [id number] Date of Birth/Sex: 08/04/1946 (71 y.o. Male) Treating RN: [name] Primary Care Joachim Carton: Oretha Milch Other Clinician: Referring Raisa Ditto: Oretha Milch Treating Mayley Lish/Extender: Rudene Re in Treatment: 66 Encounter Discharge Information Items Discharge Pain Level: 0 Discharge Condition: Stable Ambulatory Status: Wheelchair Discharge  Destination: Nursing Home Transportation: Private Auto Accompanied By: self Schedule Follow-up Appointment: No Medication Reconciliation completed and provided to Patient/Care No Mick Tanguma: Provided on Clinical Summary of Care: 01/27/2017 Form Type Recipient Paper Patient EL Electronic Signature(s) Signed: 01/27/2017 2:05:43 PM By: [name] Previous Signature: 01/27/2017 11:18:20 AM Version By: Gwenlyn Perking Entered By: [name] on 01/27/2017 14:05:43 Gotschall, Billyjack Seth Bake ([id number]) -------------------------------------------------------------------------------- Lower Extremity Assessment Details Patient Name: [name] Date of Service: 01/27/2017 10:15 AM Medical Record Number: [id number] Patient Account Number: [id number] Date of Birth/Sex: 11/12/1945 (71 y.o. Male) Treating RN: [name] Primary Care Keelia Graybill: Oretha Milch Other Clinician: Referring Acasia Skilton: Oretha Milch Treating Nilton Lave/Extender: Rudene Re in Treatment: 43 Edema Assessment Assessed: [Left: No] [Right: No] E[Left: dema] [Right: :] Calf Left: Right: Point of Measurement: 34 cm From Medial Instep 37.6 cm 32.5 cm Ankle Left: Right: Point of Measurement: 12 cm From Medial Instep 26 cm 23.3 cm Vascular Assessment Pulses: Dorsalis Pedis Palpable: [Left:Yes] [Right:Yes] Posterior Tibial Extremity colors, hair growth, and conditions: Extremity Color: [Left:Hyperpigmented] [Right:Hyperpigmented] Hair Growth on Extremity: [Left:No] [Right:No] Temperature of Extremity: [Left:Warm] [Right:Warm] Capillary Refill: [Left:< 3 seconds] [Right:< 3 seconds] Toe Nail Assessment Left: Right: Thick: Yes Yes Discolored: Yes Yes Deformed: No No Improper Length and Hygiene: No No Electronic Signature(s) Signed: 01/27/2017 4:59:17 PM By: [name] Entered By: [name] on 01/27/2017 10:40:21 Kable, Dennie Seth Bake  ([id number]) -------------------------------------------------------------------------------- Multi Wound Chart Details Patient Name: [name] Date of Service: 01/27/2017 10:15 AM Medical Record Number: [id number] Patient Account Number: [id number] Date of Birth/Sex: 1946/04/22 (71 y.o. Male) Treating RN: [name] Primary Care Kyree Fedorko: Oretha Milch Other Clinician: Referring Ocie Stanzione: Oretha Milch Treating Mackenzee Becvar/Extender: Rudene Re in Treatment: 89 Vital Signs Height(in): 70 Pulse(bpm): 69 Weight(lbs): 235 Blood Pressure 132/73 (mmHg): Body Mass Index(BMI): 34 Temperature(F): 98.2 Respiratory Rate 18 (breaths/min): Photos: [10:No Photos] [11:No Photos] [3:No Photos] Wound Location: [10:Right Lower Leg - Anterior Left Toe Fifth] [3:Right Calcaneus] Wounding Event: [10:Blister] [11:Trauma] [3:Pressure Injury] Primary Etiology: [10:Lymphedema] [11:Trauma, Other] [3:Pressure  Ulcer] Secondary Etiology: [10: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] [3:N/A] Comorbid History: [10:Cataracts, Chronic Obstructive Pulmonary Disease (COPD), Sleep Disease (COPD), Sleep Disease (COPD), Sleep Apnea, Angina, Congestive Heart Failure, Congestive Heart Failure, Congestive Heart Failure, Hypertension, Type II Diabetes,  Gout, Osteoarthritis, Neuropathy Osteoarthritis, Neuropathy Osteoarthritis, Neuropathy] [11:Cataracts, Chronic Obstructive Pulmonary Apnea, Angina, Hypertension, Type II Diabetes, Gout,] [3:Cataracts, Chronic Obstructive Pulmonary Apnea, Angina,  Hypertension, Type II Diabetes, Gout,] Date Acquired: [10:08/22/2016] [11:01/06/2017] [3:10/31/2015] Weeks of Treatment: [10:22] [11:2] [3:56] Wound Status: [10:Open] [11:Open] [3:Open] Clustered Wound: [10:No] [11:No] [3:No] Measurements L x W x D 4x14x0.1 [11:0.2x0.5x0.1] [3:0.3x0.6x0.1] (cm) Area (cm) : [10:43.982] [11:0.079] [3:0.141] Volume (cm) : [10:4.398] [11:0.008] [3:0.014] % Reduction in Area:  [10:-3633.60%] [11:59.70%] [3:98.00%] % Reduction in Volume: -3627.10% [11:60.00%] [3:99.00%] Classification: [10:Partial Thickness] [11:Partial Thickness] [3:Category/Stage III] HBO Classification: [10:Grade 1] [11:Grade 1] [3:Grade 1] Exudate Amount: [10:Large] [11:Large] [3:Large] Exudate Type: [10:Serosanguineous] [11:Serous] [3:Serous] Exudate Color: [10:red, brown] [11:amber] [3:amber] Wound Margin: [10:Flat and Intact] [11:Flat and Intact] [3:Flat and Intact] Granulation Amount: Large (67-100%) None Present (0%) Medium (34-66%) Granulation Quality: Red N/A Pink Necrotic Amount: Small (1-33%) None Present (0%) Medium (34-66%) Exposed Structures: Fascia: No Fascia: No Fascia: No Fat Layer (Subcutaneous Fat Layer (Subcutaneous Fat Layer (Subcutaneous Tissue) Exposed: No Tissue) Exposed: No Tissue) Exposed: No Tendon: No Tendon: No Tendon: No Muscle: No Muscle: No Muscle: No Joint: No Joint: No Joint: No Bone: No Bone: No Bone: No Limited to Skin Limited to Skin Limited to Skin Breakdown Breakdown Breakdown Epithelialization: Large (67-100%) None None Periwound Skin Texture: Excoriation: No Excoriation: No Excoriation: No Induration: No Induration: No Induration: No Callus: No Callus: No Callus: No Crepitus: No Crepitus: No Crepitus: No Rash: No Rash: No Rash: No Scarring: No Scarring: No Scarring: No Periwound Skin Maceration: No Maceration: No Maceration: Yes Moisture: Dry/Scaly: No Dry/Scaly: No Dry/Scaly: No Periwound Skin Color: Atrophie Blanche: No Erythema: Yes Atrophie Blanche: No Cyanosis: No Atrophie Blanche: No Cyanosis: No Ecchymosis: No Cyanosis: No Ecchymosis: No Erythema: No Ecchymosis: No Erythema: No Hemosiderin Staining: No Hemosiderin Staining: No Hemosiderin Staining: No Mottled: No Mottled: No Mottled: No Pallor: No Pallor: No Pallor: No Rubor: No Rubor: No Rubor: No Erythema Location: N/A Circumferential  N/A Temperature: No Abnormality No Abnormality N/A Tenderness on Yes No Yes Palpation: Wound Preparation: Ulcer Cleansing: Other: Ulcer Cleansing: Ulcer Cleansing: Other: soap and water Rinsed/Irrigated with soap and water Saline Topical Anesthetic Topical Anesthetic Applied: None Topical Anesthetic Applied: Other: lidocaine Applied: Other: lidocaine 4% 4% Wound Number: 9 N/A N/A Photos: No Photos N/A N/A Wound Location: Left Lower Leg - N/A N/A Circumfernential Wounding Event: Gradually Appeared N/A N/A 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 N/A N/A Comorbid History: N/A N/A MURICE, BARBAR ([id number]) Cataracts, Chronic Obstructive Pulmonary Disease (COPD), Sleep Apnea, Angina, Congestive Heart Failure, Hypertension, Type II Diabetes, Gout, Osteoarthritis, Neuropathy Date Acquired: 08/09/2016 N/A N/A Weeks of Treatment: 23 N/A N/A Wound Status: Open N/A N/A Clustered Wound: Yes N/A N/A Measurements L x W x D 2.5x4.5x0.1 N/A N/A (cm) Area (cm) : 8.836 N/A N/A Volume (cm) : 0.884 N/A N/A % Reduction in Area: 91.20% N/A N/A % Reduction in Volume: 91.20% N/A N/A Classification: Grade 2 N/A N/A HBO Classification: N/A N/A N/A Exudate Amount: Large N/A N/A Exudate Type: Serosanguineous N/A N/A Exudate Color: red, brown N/A N/A Wound Margin: Flat and Intact N/A N/A Granulation Amount: Large (67-100%) N/A N/A Granulation Quality: Pink N/A N/A Necrotic Amount: Small (1-33%)  N/A N/A Exposed Structures: Fascia: No N/A N/A Fat Layer (Subcutaneous Tissue) Exposed: No Tendon: No Muscle: No Joint: No Bone: No Limited to Skin Breakdown Epithelialization: Large (67-100%) N/A N/A Periwound Skin Texture: Excoriation: No N/A N/A Induration: No Callus: No Crepitus: No Rash: No Scarring: No Periwound Skin Maceration: Yes N/A N/A Moisture: Dry/Scaly: No Periwound Skin Color: Mottled: Yes N/A  N/A Atrophie Blanche: No Cyanosis: No Ecchymosis: No Erythema: No Heffernan, Farrel V. ([id number]) Hemosiderin Staining: No Pallor: No Rubor: No Erythema Location: N/A N/A N/A Temperature: N/A N/A N/A Tenderness on Yes N/A N/A Palpation: Wound Preparation: Ulcer Cleansing: Other: N/A N/A soap and water Topical Anesthetic Applied: None Treatment Notes Electronic Signature(s) Signed: 01/27/2017 11:10:36 AM By: Evlyn Kanner MD, FACS Entered By: Evlyn Kanner on 01/27/2017 11:10:36 [name] ([id number]) -------------------------------------------------------------------------------- Multi-Disciplinary Care Plan Details Patient Name: [name] Date of Service: 01/27/2017 10:15 AM Medical Record Number: [id number] Patient Account Number: [id number] Date of Birth/Sex: [birth year] (71 y.o. Male) Treating RN: [name] Primary Care Torri Michalski: Oretha Milch Other Clinician: Referring Arial Galligan: Oretha Milch Treating Hser Belanger/Extender: Rudene Re in Treatment: 32 Active Inactive Electronic Signature(s) Signed: 01/27/2017 2:07:32 PM By: [name] Entered By: [name] on 01/27/2017 14:07:30 [name] ([id number]) -------------------------------------------------------------------------------- Pain Assessment Details Patient Name: [name] Date of Service: 01/27/2017 10:15 AM Medical Record Number: [id number] Patient Account Number: [id number] Date of Birth/Sex: 08/07/1946 (71 y.o. Male) Treating RN: [name] Primary Care Jadis Mika: Oretha Milch Other Clinician: Referring Iain Sawchuk: Oretha Milch Treating Kanetra Ho/Extender: Rudene Re in Treatment: 39 Active Problems Location of Pain Severity and Description of Pain Patient Has Paino Yes Site Locations Pain Location: Pain in Ulcers With Dressing Change: Yes Duration of the Pain. Constant / Intermittento Intermittent Pain Management and Medication Current Pain  Management: Notes Topical or injectable lidocaine is offered to patient for acute pain when surgical debridement is performed. If needed, Patient is instructed to use over the counter pain medication for the following 24-48 hours after debridement. Wound care MDs do not prescribed pain medications. Patient has chronic pain or uncontrolled pain. Patient has been instructed to make an appointment with their Primary Care Physician for pain management. Electronic Signature(s) Signed: 01/27/2017 4:59:17 PM By: [name] Entered By: [name] on 01/27/2017 10:23:56 [name] ([id number]) -------------------------------------------------------------------------------- Patient/Caregiver Education Details Patient Name: [name] Date of Service: 01/27/2017 10:15 AM Medical Record Number: [id number] Patient Account Number: [id number] Date of Birth/Gender: [birth year] (71 y.o. Male) Treating RN: [name] Primary Care Physician: Oretha Milch Other Clinician: Referring Physician: Oretha Milch Treating Physician/Extender: Rudene Re in Treatment: 14 Education Assessment Education Provided To: Patient Education Topics Provided Wound/Skin Impairment: Handouts: Other: please f/u with appropriate WCC Methods: Explain/Verbal, Printed Responses: State content correctly Electronic Signature(s) Signed: 01/27/2017 4:59:17 PM By: [name] Entered By: [name] on 01/27/2017 14:06:17 Wimes, Garrette Seth Bake ([id number]) -------------------------------------------------------------------------------- Wound Assessment Details Patient Name: [name] Date of Service: 01/27/2017 10:15 AM Medical Record Number: [id number] Patient Account Number: [id number] Date of Birth/Sex: 1946/05/16 (71 y.o. Male) Treating RN: [name] Primary Care Barnes Florek: Oretha Milch Other Clinician: Referring Kenadie Royce: Oretha Milch Treating Kaiah Hosea/Extender: Rudene Re  in Treatment: 29 Wound Status Wound Number: 10 Primary Lymphedema Etiology: Wound Location: Right Lower Leg - Anterior Secondary Diabetic Wound/Ulcer of the Lower Wounding Event: Blister Etiology: Extremity Date Acquired: 08/22/2016 Wound Open Weeks Of Treatment: 22 Status: Clustered Wound: No Comorbid Cataracts, Chronic Obstructive History: Pulmonary Disease (COPD), Sleep Apnea, Angina, Congestive Heart Failure,  Hypertension, Type II Diabetes, Gout, Osteoarthritis, Neuropathy Photos Photo Uploaded By: [name] on 01/27/2017 11:49:11 Wound Measurements Length: (cm) 4 Width: (cm) 14 Depth: (cm) 0.1 Area: (cm) 43.982 Volume: (cm) 4.398 % Reduction in Area: -3633.6% % Reduction in Volume: -3627.1% Epithelialization: Large (67-100%) Tunneling: No Undermining: No Wound Description Classification: Partial Thickness Foul Odor Afte Diabetic Severity (Wagner): Grade 1 Slough/Fibrino Wound Margin: Flat and Intact Exudate Amount: Large Exudate Type: Serosanguineous Exudate Color: red, brown Bob, Saxton V. ([id number]) r Cleansing: No No Wound Bed Granulation Amount: Large (67-100%) Exposed Structure Granulation Quality: Red Fascia Exposed: No Necrotic Amount: Small (1-33%) Fat Layer (Subcutaneous Tissue) Exposed: No Necrotic Quality: Adherent Slough Tendon Exposed: No Muscle Exposed: No Joint Exposed: No Bone Exposed: No Limited to Skin Breakdown Periwound Skin Texture Texture Color No Abnormalities Noted: No No Abnormalities Noted: No Callus: No Atrophie Blanche: No Crepitus: No Cyanosis: No Excoriation: No Ecchymosis: No Induration: No Erythema: No Rash: No Hemosiderin Staining: No Scarring: No Mottled: No Pallor: No Moisture Rubor: No No Abnormalities Noted: No Dry / Scaly: No Temperature / Pain Maceration: No Temperature: No Abnormality Tenderness on Palpation: Yes Wound Preparation Ulcer Cleansing: Other: soap and water, Topical  Anesthetic Applied: None Treatment Notes Wound #10 (Right, Anterior Lower Leg) 1. Cleansed with: Cleanse wound with antibacterial soap and water 4. Dressing Applied: Aquacel Ag 5. Secondary Dressing Applied ABD Pad 7. Secured with 3 Layer Compression System - Bilateral Electronic Signature(s) Signed: 01/27/2017 4:59:17 PM By: [name] Entered By: [name] on 01/27/2017 10:46:44 [name] ([id number]) -------------------------------------------------------------------------------- Wound Assessment Details Patient Name: [name] Date of Service: 01/27/2017 10:15 AM Medical Record Number: [id number] Patient Account Number: [id number] Date of Birth/Sex: 1945/12/07 (71 y.o. Male) Treating RN: [name] Primary Care Klinton Candelas: Oretha Milch Other Clinician: Referring Peightyn Roberson: Oretha Milch Treating Cayce Paschal/Extender: Rudene Re in Treatment: 7 Wound Status Wound Number: 11 Primary Trauma, Other Etiology: Wound Location: Left Toe Fifth Wound Open Wounding Event: Trauma Status: Date Acquired: 01/06/2017 Comorbid Cataracts, Chronic Obstructive Weeks Of Treatment: 2 History: Pulmonary Disease (COPD), Sleep Clustered Wound: No Apnea, Angina, Congestive Heart Failure, Hypertension, Type II Diabetes, Gout, Osteoarthritis, Neuropathy Photos Photo Uploaded By: [name] on 01/27/2017 11:49:11 Wound Measurements Length: (cm) 0.2 Width: (cm) 0.5 Depth: (cm) 0.1 Area: (cm) 0.079 Volume: (cm) 0.008 % Reduction in Area: 59.7% % Reduction in Volume: 60% Epithelialization: None Tunneling: No Undermining: No Wound Description Classification: Partial Thickness Foul Odor A Diabetic Severity (Wagner): Grade 1 Slough/Fibr Wound Margin: Flat and Intact Exudate Amount: Large Exudate Type: Serous Exudate Color: amber fter Cleansing: No ino No Wound Bed Granulation Amount: None Present (0%) Exposed Structure Renbarger, Kelan V.  ([id number]) Necrotic Amount: None Present (0%) Fascia Exposed: No Fat Layer (Subcutaneous Tissue) Exposed: No Tendon Exposed: No Muscle Exposed: No Joint Exposed: No Bone Exposed: No Limited to Skin Breakdown Periwound Skin Texture Texture Color No Abnormalities Noted: No No Abnormalities Noted: No Callus: No Atrophie Blanche: No Crepitus: No Cyanosis: No Excoriation: No Ecchymosis: No Induration: No Erythema: Yes Rash: No Erythema Location: Circumferential Scarring: No Hemosiderin Staining: No Mottled: No Moisture Pallor: No No Abnormalities Noted: No Rubor: No Dry / Scaly: No Maceration: No Temperature / Pain Temperature: No Abnormality Wound Preparation Ulcer Cleansing: Rinsed/Irrigated with Saline Topical Anesthetic Applied: Other: lidocaine 4%, Treatment Notes Wound #11 (Left Toe Fifth) 1. Cleansed with: Cleanse wound with antibacterial soap and water 4. Dressing Applied: Aquacel Ag Notes coverlet Electronic Signature(s) Signed: 01/27/2017 4:59:17 PM By: [name] Entered By:  Curtis Sitesorthy, Joanna on 01/27/2017 10:47:36 Helfman, Christopherjohn Seth BakeV. ([id number]) -------------------------------------------------------------------------------- Wound Assessment Details Patient Name: [name] OttoLINDLEY, Johnrobert V. Date of Service: 01/27/2017 10:15 AM Medical Record Number: [id number] Patient Account Number: [id number] Date of Birth/Sex: Sep 21, 1946 [age] y.o. Male) Treating RN: Curtis Sitesorthy, Joanna Primary Care Zen Cedillos: Oretha MilchSMITH, [name] Other Clinician: Referring Marleni Gallardo: Oretha MilchSMITH, [name] Treating Quintara Bost/Extender: Rudene ReBritto, Errol Weeks in Treatment: [id number] Wound Status Wound Number: 3 Primary Pressure Ulcer Etiology: Wound Location: Right Calcaneus Wound Open Wounding Event: Pressure Injury Status: Date Acquired: 10/31/2015 Comorbid Cataracts, Chronic Obstructive Weeks Of Treatment: 56 History: Pulmonary Disease (COPD), Sleep Clustered Wound: No Apnea, Angina, Congestive Heart Failure,  Hypertension, Type II Diabetes, Gout, Osteoarthritis, Neuropathy Photos Photo Uploaded By: Curtis Sitesorthy, Joanna on 01/27/2017 11:49:57 Wound Measurements Length: (cm) 0.3 Width: (cm) 0.6 Depth: (cm) 0.1 Area: (cm) 0.141 Volume: (cm) 0.014 % Reduction in Area: 98% % Reduction in Volume: 99% Epithelialization: None Tunneling: No Undermining: No Wound Description Classification: Category/Stage III Foul Odor A Diabetic Severity (Wagner): Grade 1 Wound Margin: Flat and Intact Exudate Amount: Large Exudate Type: Serous Exudate Color: amber fter Cleansing: No Wound Bed Granulation Amount: Medium (34-66%) Exposed Structure Ngu, Canuto V. ([id number]) Granulation Quality: Pink Fascia Exposed: No Necrotic Amount: Medium (34-66%) Fat Layer (Subcutaneous Tissue) Exposed: No Necrotic Quality: Adherent Slough Tendon Exposed: No Muscle Exposed: No Joint Exposed: No Bone Exposed: No Limited to Skin Breakdown Periwound Skin Texture Texture Color No Abnormalities Noted: No No Abnormalities Noted: No Callus: No Atrophie Blanche: No Crepitus: No Cyanosis: No Excoriation: No Ecchymosis: No Induration: No Erythema: No Rash: No Hemosiderin Staining: No Scarring: No Mottled: No Pallor: No Moisture Rubor: No No Abnormalities Noted: No Dry / Scaly: No Temperature / Pain Maceration: Yes Tenderness on Palpation: Yes Wound Preparation Ulcer Cleansing: Other: soap and water, Topical Anesthetic Applied: Other: lidocaine 4%, Treatment Notes Wound #3 (Right Calcaneus) 1. Cleansed with: Cleanse wound with antibacterial soap and water 4. Dressing Applied: Aquacel Ag 5. Secondary Dressing Applied ABD Pad 7. Secured with 3 Layer Compression System - Bilateral Electronic Signature(s) Signed: 01/27/2017 4:59:17 PM By: Curtis Sitesorthy, Joanna Entered By: Curtis Sitesorthy, Joanna on 01/27/2017 10:48:12 [name] OttoLINDLEY, Lindbergh V.  ([id number]) -------------------------------------------------------------------------------- Wound Assessment Details Patient Name: [name] OttoLINDLEY, Becker V. Date of Service: 01/27/2017 10:15 AM Medical Record Number: [id number] Patient Account Number: [id number] Date of Birth/Sex: Sep 21, 1946 [age] y.o. Male) Treating RN: Curtis Sitesorthy, Joanna Primary Care Cheikh Bramble: Oretha MilchSMITH, [name] Other Clinician: Referring Braddock Servellon: Oretha MilchSMITH, [name] Treating Izabel Chim/Extender: Rudene ReBritto, Errol Weeks in Treatment: [id number] Wound Status Wound Number: 9 Primary Diabetic Wound/Ulcer of the Lower Etiology: Extremity Wound Location: Left Lower Leg - Circumfernential Secondary Venous Leg Ulcer Etiology: Wounding Event: Gradually Appeared Wound Open Date Acquired: 08/09/2016 Status: Weeks Of Treatment: 23 Comorbid Cataracts, Chronic Obstructive Clustered Wound: Yes History: Pulmonary Disease (COPD), Sleep Apnea, Angina, Congestive Heart Failure, Hypertension, Type II Diabetes, Gout, Osteoarthritis, Neuropathy Photos Photo Uploaded By: Curtis Sitesorthy, Joanna on 01/27/2017 11:49:57 Wound Measurements Length: (cm) 2.5 Width: (cm) 4.5 Depth: (cm) 0.1 Area: (cm) 8.836 Volume: (cm) 0.884 % Reduction in Area: 91.2% % Reduction in Volume: 91.2% Epithelialization: Large (67-100%) Tunneling: No Undermining: No Wound Description Classification: Grade 2 Wound Margin: Flat and Intact Exudate Amount: Large Exudate Type: Serosanguineous Exudate Color: red, brown Dresser, Jayon V. ([id number]) Foul Odor After Cleansing: No Slough/Fibrino No Wound Bed Granulation Amount: Large (67-100%) Exposed Structure Granulation Quality: Pink Fascia Exposed: No Necrotic Amount: Small (1-33%) Fat Layer (Subcutaneous Tissue) Exposed: No Necrotic Quality: Adherent Slough Tendon Exposed: No Muscle Exposed: No Joint Exposed: No Bone Exposed: No Limited to Skin Breakdown  Periwound Skin Texture Texture Color No Abnormalities Noted: No No Abnormalities  Noted: No Callus: No Atrophie Blanche: No Crepitus: No Cyanosis: No Excoriation: No Ecchymosis: No Induration: No Erythema: No Rash: No Hemosiderin Staining: No Scarring: No Mottled: Yes Pallor: No Moisture Rubor: No No Abnormalities Noted: No Dry / Scaly: No Temperature / Pain Maceration: Yes Tenderness on Palpation: Yes Wound Preparation Ulcer Cleansing: Other: soap and water, Topical Anesthetic Applied: None Treatment Notes Wound #9 (Left, Circumferential Lower Leg) 1. Cleansed with: Cleanse wound with antibacterial soap and water 4. Dressing Applied: Aquacel Ag 5. Secondary Dressing Applied ABD Pad 7. Secured with 3 Layer Compression System - Bilateral Electronic Signature(s) Signed: 01/27/2017 4:59:17 PM By: [name] Entered By: [name] on 01/27/2017 10:48:27 [name] ([id number]) -------------------------------------------------------------------------------- Vitals Details Patient Name: [name] Date of Service: 01/27/2017 10:15 AM Medical Record Number: [id number] Patient Account Number: [id number] Date of Birth/Sex: 12-Mar-1946 (71 y.o. Male) Treating RN: [name] Primary Care Azari Hasler: Oretha Milch Other Clinician: Referring Norell Brisbin: Oretha Milch Treating Gentry Pilson/Extender: Rudene Re in Treatment: 50 Vital Signs Time Taken: 10:23 Temperature (F): 98.2 Height (in): 70 Pulse (bpm): 69 Weight (lbs): 235 Respiratory Rate (breaths/min): 18 Body Mass Index (BMI): 33.7 Blood Pressure (mmHg): 132/73 Reference Range: 80 - 120 mg / dl Electronic Signature(s) Signed: 01/27/2017 4:59:17 PM By: [name] Entered By: [name] on 01/27/2017 10:24:18

## 2017-01-28 NOTE — Progress Notes (Signed)
[name], [name] ([id number]) Visit Report for 01/27/2017 Chief Complaint Document Details Patient Name: [name], [name] Date of Service: 01/27/2017 10:15 AM Medical Record Number: [id number] Patient Account Number: [id number] Date of Birth/Sex: 14-Mar-1946 [age] y.o. Male) Treating RN: Curtis Sitesorthy, Joanna Primary Care Provider: Oretha MilchSMITH, [name] Other Clinician: Referring Provider: Oretha MilchSMITH, [name] Treating Provider/Extender: Rudene ReBritto, Alissia Lory Weeks in Treatment: 3856 Information Obtained from: Patient Chief Complaint Mr. [name] presents for follow-up on his right heel pressure ulcer and bilateral lower extremity lymphedema and venous ulcerations. Electronic Signature(s) Signed: 01/27/2017 11:10:49 AM By: Evlyn KannerBritto, Nari Vannatter MD, FACS Entered By: Evlyn KannerBritto, Rodriguez Aguinaldo on 01/27/2017 11:10:49 [name], [name] ([id number]) -------------------------------------------------------------------------------- HPI Details Patient Name: [name], [name] Date of Service: 01/27/2017 10:15 AM Medical Record Number: [id number] Patient Account Number: [id number] Date of Birth/Sex: 14-Mar-1946 [age] y.o. Male) Treating RN: Curtis Sitesorthy, Joanna Primary Care Provider: Oretha MilchSMITH, [name] Other Clinician: Referring Provider: Oretha MilchSMITH, [name] Treating Provider/Extender: Rudene ReBritto, Melannie Metzner Weeks in Treatment: 2956 History of Present Illness Location: ulcerated area on the right heel, left gluteal region and thigh and then bilateral lower extremities Quality: Patient reports experiencing a sharp pain to affected area(s). Severity: Patient states wound are getting worse. Duration: Patient has had the wound for > 12 months prior to seeking treatment at the wound center Timing: Pain in wound is constant (hurts all the time) Context: The wound appeared gradually over time Modifying Factors: Other treatment(s) tried include: he sees his heart doctor and his primary care doctor Associated Signs and Symptoms: patient has not been able to walk for over a year now HPI  Description: 71 year old gentleman with a known history of hypertension, diabetes, obstructive sleep apnea, COPD, diastolic CHF, coronary artery disease was admitted to the hospital with sepsis from an ulcer of the right heel and was treated there in October 2016. he also has chronic bilateral lower extremity edema and lymphedema. He had received vancomycin, Zosyn and at that stage and x-rays showed hardware in the right ankle but no evidence of osteomyelitis. he was a former smoker. he was also treated with Augmentin orally for 10 days. He is either bedbound or wheelchair-bound and does not ambulate by himself. 01/19/2016 -- he has not been seen here for 3 weeks and this was because he was admitted to [facility] between 223 and 01/08/2016 for sepsis, UTI and pneumonia involving the left lung. he was treated with IV vancomycin and Zosyn and then meropenem. Was discharged home on oral Bactrim for 2 weeks none of his vascular test or x-rays were done and we will reorder these. 01/26/2016 -- he has not yet done the x-ray of his foot and is vascular tests are still pending. I have asked him to work on these with his nursing home staff. Addendum: he has got an x-ray of the right foot done which shows that his osteopenia but no specific ostial lysis or abnormal periosteal reaction. Final impression was degenerative changes with dorsal foot soft tissue swelling. 02/16/2016 -- lower extremity arterial duplex examination shows a 50-99% stenosis of the right tibioperoneal trunk. He had biphasic flow in the right SFA, popliteal and tibioperoneal trunk. Left-sided he had triphasic flow throughout 02/29/2016 -- he is awaiting his vascular opinion with Dr. Gilda CreaseSchnier which is to be done on April 24 03/08/2016 -- he has not kept his appointment with Dr. Gilda CreaseSchnier on April 24 and does not seem to know what happened about this. it's difficult to gauge whether he is in full control of his mental  faculties as at times he  is extremely rude to the nursing staff. 03/22/2016 -- the patient was seen by Dr. 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 Recommended that the patient had severe atherosclerotic changes of both lower extremities associated with ulceration and tissue loss of the foot. This is a limb threatening ischemia and the patient was recommended to undergo [name], [name] ([id number]) angiography of the lower extremity with a hope for intervention for limb salvage. Patient agreed and will proceed to angiography. He was admitted to the hospital between May 2 and 03/15/2016 - he underwent induction of a catheter into his right lower extremity and third order catheter placement with contrast injection to the right lower extremity for distal runoff. Percutaneous transluminal angioplasty of the right superficial femoral and popliteal arteries were done. He also had a right peroneal angioplasty. Patient had a postoperative hematoma and was admitted for observation and in the next 24 hours he was very agitated and combative and had to be seen by psychiatric. He had a follow-up with Dr. Gilda Crease in 3 weeks. 04/18/2016 -- notes reviewed from the vascular office where he was seen for his postop visit after the PTA of the right SFA and popliteal arteries on 03/12/2016. He underwent an ABI which showed right ABI to be more than 1.3 and left to be more than 1 more than 1.3, great toe and PPG waveforms are decreased bilaterally. No additional intervention indicated at this time and the patient was to follow-up in 3 months with an ABI and bilateral lower extremity duplex study. 05/02/2016 -- he complains that his compression stockings are causing him a lot of discomfort and he is not happy wearing them. 05/30/2016 -- the swelling of both legs has increased again and he has had  blisters which have opened out into ulcerations. 09/12/2016 -- the lymphedema pumps are still not delivered and hopefully will be done within this week. As far as his skin substitute goes we are still awaiting a response from his insurance 10-10-2016: Mr. Jue presents today and states that his left edema pumps have arrived but the he has only received treatment "5 times" since arrival. He also denies using the offloading boot for the right heel but admits to having the boot. He states that staff is hesitant to assist and application of the lymphedema pumps. 10/17/2016 -- I understand he continues to take his diuretics and has been using his lymphedema pump once a day for about an hour. He cannot do it twice a day. 10-31-16 Mr. Fontenot presents today for follow-up on his bilateral lower extremity venous ulcers and his right posterior heel pressure ulcer. He states that he continues to do his lymphedema pumps once daily, unable to get assistance for twice daily. he does admit to increased drainage to the left leg more than the right leg. He denies any pain, fever, chills or overall ill feeling. 11/08/2016 -- the skin substitute which was run through his insurance company has a high copayment and he is not agreeable about getting this. 11/22/2016 -- he is still not able to use his lymphedema pumps as requested due to lack of help at his living facility. 12/23/2016 -- It was brought to my notice last week, that this patient has been extremely rude and insulting to my staff nurse and she was upset and was in tears. This is not the first time the patient has done this and in  the past over several weeks, I have addressed the situation, from time to time. I have personally been insulted by him with rude comments, but I have thought this may be due to an element of dementia. However at this stage, his management is beyond the scope of our practice and I will speak to his nursing supervisor and have  them assessed by his PCP for behavioral disorders. We will give him noticed to find a another wound center of his choice and will discharge him from our services. an Dance movement psychotherapist, under registered mail is going to be sent to him. However, I have personally discussed the aforementioned topic with him and he has had no commands or questions. 01/13/2017 -- since I saw the patient last 3 weeks ago his sister had come to inquire about him and we had a detailed discussion regarding his behavioral issues and the sister had all her questions answered. Gilder, [name] ([id number]) Dorma Russell is back today with the same dressing applied on 3 weeks ago and has been in a subdued mood. 01/27/2017 -- the patient is back here after 2 weeks and his dressing had not been changed for the 2 weeks he has not seen [place]. On removing his dressing he had superficial blisters and ulceration both lower extremities. Electronic Signature(s) Signed: 01/27/2017 11:11:33 AM By: Evlyn Kanner MD, FACS Entered By: Evlyn Kanner on 01/27/2017 11:11:33 [name] ([id number]) -------------------------------------------------------------------------------- Physical Exam Details Patient Name: [name] Date of Service: 01/27/2017 10:15 AM Medical Record Number: [id number] Patient Account Number: [id number] Date of Birth/Sex: 11-04-1946 (71 y.o. Male) Treating RN: [name] Primary Care Provider: Oretha Milch Other Clinician: Referring Provider: Oretha Milch Treating Provider/Extender: Rudene Re in Treatment: 56 Constitutional . Pulse regular. Respirations normal and unlabored. Afebrile. . Eyes Nonicteric. Reactive to light. Ears, Nose, Mouth, and Throat Lips, teeth, and gums WNL.Marland [name] Moist mucosa without lesions. Neck supple and nontender. No palpable supraclavicular or cervical adenopathy. Normal sized without goiter. Respiratory WNL. No retractions.. Breath sounds WNL, No rubs, rales, rhonchi, or  wheeze.. Cardiovascular Heart rhythm and rate regular, no murmur or gallop.. Pedal Pulses WNL. No clubbing, cyanosis or edema. Lymphatic No adneopathy. No adenopathy. No adenopathy. Musculoskeletal Adexa without tenderness or enlargement.. Digits and nails w/o clubbing, cyanosis, infection, petechiae, ischemia, or inflammatory conditions.. Integumentary (Hair, Skin) No suspicious lesions. No crepitus or fluctuance. No peri-wound warmth or erythema. No masses.Marland [name] Psychiatric Judgement and insight Intact.. No evidence of depression, anxiety, or agitation.. Notes the wound on the right medial calcaneus looks much cleaner and no sharp debridement was required today. He has got superficial ulcerations in both lower extremities in the lateral and medial calf areas and these did not need debridement. Electronic Signature(s) Signed: 01/27/2017 11:12:16 AM By: Evlyn Kanner MD, FACS Entered By: Evlyn Kanner on 01/27/2017 11:12:16 [name] ([id number]) -------------------------------------------------------------------------------- Physician Orders Details Patient Name: [name] Date of Service: 01/27/2017 10:15 AM Medical Record Number: [id number] Patient Account Number: [id number] Date of Birth/Sex: 16-[name]-1947 (71 y.o. Male) Treating RN: [name] Primary Care Provider: Oretha Milch Other Clinician: Referring Provider: Oretha Milch Treating Provider/Extender: Rudene Re in Treatment: 27 Verbal / Phone Orders: No Diagnosis Coding Wound Cleansing Wound #10 Right,Anterior Lower Leg o Clean wound with Normal Saline. - at clinic o Cleanse wound with mild soap and water - at clinic o No tub bath. - only sink bath or bed bath Wound #11 Left Toe Fifth o Clean wound with Normal Saline. - at clinic o  Cleanse wound with mild soap and water - at clinic o No tub bath. - only sink bath or bed bath Wound #3 Right Calcaneus o Clean wound with Normal Saline. -  at clinic o Cleanse wound with mild soap and water - at clinic o No tub bath. - only sink bath or bed bath Wound #9 Left,Circumferential Lower Leg o Clean wound with Normal Saline. - at clinic o Cleanse wound with mild soap and water - at clinic o No tub bath. - only sink bath or bed bath Anesthetic Wound #10 Right,Anterior Lower Leg o Topical Lidocaine 4% cream applied to wound bed prior to debridement - for clinic use only Wound #11 Left Toe Fifth o Topical Lidocaine 4% cream applied to wound bed prior to debridement - for clinic use only Wound #3 Right Calcaneus o Topical Lidocaine 4% cream applied to wound bed prior to debridement - for clinic use only Wound #9 Left,Circumferential Lower Leg o Topical Lidocaine 4% cream applied to wound bed prior to debridement - for clinic use only Skin Barriers/Peri-Wound Care Wound #10 Right,Anterior Lower Leg o Antifungal cream - Nystatin Bertucci, [name] ([id number]) o Triamcinolone Acetonide Ointment Wound #11 Left Toe Fifth o Antifungal cream - Nystatin o Triamcinolone Acetonide Ointment Wound #3 Right Calcaneus o Antifungal cream - Nystatin o Triamcinolone Acetonide Ointment Wound #9 Left,Circumferential Lower Leg o Antifungal cream - Nystatin o Triamcinolone Acetonide Ointment Primary Wound Dressing Wound #10 Right,Anterior Lower Leg o Aquacel Ag Wound #11 Left Toe Fifth o Aquacel Ag Wound #3 Right Calcaneus o Aquacel Ag Wound #9 Left,Circumferential Lower Leg o Aquacel Ag Secondary Dressing Wound #10 Right,Anterior Lower Leg o ABD pad o XtraSorb Wound #11 Left Toe Fifth o Other - coverlet Wound #3 Right Calcaneus o ABD pad o XtraSorb Wound #9 Left,Circumferential Lower Leg o ABD pad o XtraSorb Dressing Change Frequency Wound #10 Right,Anterior Lower Leg o Change dressing every week o Other: - SNF to change wrap weekly if patient does not come to his  scheduled wound care appointments Kras, [name] ([id number]) Wound #11 Left Toe Fifth o Change dressing every other day. o Change dressing every week o Other: - SNF to change wrap weekly if patient does not come to his scheduled wound care appointments Wound #3 Right Calcaneus o Change dressing every week o Other: - SNF to change wrap weekly if patient does not come to his scheduled wound care appointments Wound #9 Left,Circumferential Lower Leg o Change dressing every week o Other: - SNF to change wrap weekly if patient does not come to his scheduled wound care appointments Follow-up Appointments Wound #10 Right,Anterior Lower Leg o Return Appointment in 1 week. Wound #11 Left Toe Fifth o Return Appointment in 1 week. Wound #3 Right Calcaneus o Return Appointment in 1 week. Wound #9 Left,Circumferential Lower Leg o Return Appointment in 1 week. Edema Control Wound #10 Right,Anterior Lower Leg o 3 Layer Compression System - Bilateral o Compression Pump: Use compression pump on left lower extremity for 30 minutes, twice daily. o Compression Pump: Use compression pump on right lower extremity for 30 minutes, twice daily. o Other: - unna to anchor Wound #11 Left Toe Fifth o 3 Layer Compression System - Bilateral o Compression Pump: Use compression pump on left lower extremity for 30 minutes, twice daily. o Compression Pump: Use compression pump on right lower extremity for 30 minutes, twice daily. o Other: - unna to anchor Wound #3 Right Calcaneus o 3 Layer Compression System - Bilateral  Whitefield, [name] ([id number]) o Compression Pump: Use compression pump on left lower extremity for 30 minutes, twice daily. o Compression Pump: Use compression pump on right lower extremity for 30 minutes, twice daily. o Other: - unna to anchor Off-Loading Wound #10 Right,Anterior Lower Leg o Turn and reposition every 2 hours o  Other: - sage boots at night and float heel when lying in bed *******lymphedema pumps to be placed on pt twice a day for one hour sessions******** Wound #11 Left Toe Fifth o Turn and reposition every 2 hours o Other: - sage boots at night and float heel when lying in bed *******lymphedema pumps to be placed on pt twice a day for one hour sessions******** Wound #3 Right Calcaneus o Turn and reposition every 2 hours o Other: - sage boots at night and float heel when lying in bed *******lymphedema pumps to be placed on pt twice a day for one hour sessions******** Wound #9 Left,Circumferential Lower Leg o Turn and reposition every 2 hours o Other: - sage boots at night and float heel when lying in bed *******lymphedema pumps to be placed on pt twice a day for one hour sessions******** Additional Orders / Instructions Wound #10 Right,Anterior Lower Leg o Increase protein intake. Wound #11 Left Toe Fifth o Increase protein intake. Wound #3 Right Calcaneus o Increase protein intake. Wound #9 Left,Circumferential Lower Leg o Increase protein intake. Medications-please add to medication list. Wound #10 Right,Anterior Lower Leg o Other: - Vitamin C, Vitamin A, Zinc, multivitamin Corson, [name] ([id number]) Wound #11 Left Toe Fifth o Other: - Vitamin C, Vitamin A, Zinc, multivitamin Wound #3 Right Calcaneus o Other: - Vitamin C, Vitamin A, Zinc, multivitamin Wound #9 Left,Circumferential Lower Leg o Other: - Vitamin C, Vitamin A, Zinc, multivitamin Discharge From [facility] Wound #10 Right,Anterior Lower Leg o Discharge from Wound Care Center Wound #11 Left Toe Fifth o Discharge from Wound Care Center Wound #3 Right Calcaneus o Discharge from Wound Care Center Wound #9 Left,Circumferential Lower Leg o Discharge from Wound Care Center Notes Leg wraps need to be changed on a weekly basis. Please see that patient is seen by an appropriate  Wound Care Center so that wraps and wounds can be cared for appropriately Electronic Signature(s) Signed: 01/27/2017 4:53:16 PM By: Evlyn Kanner MD, FACS Signed: 01/27/2017 4:59:17 PM By: [name] Entered By: [name] on 01/27/2017 10:50:31 [name] ([id number]) -------------------------------------------------------------------------------- Problem List Details Patient Name: [name] Date of Service: 01/27/2017 10:15 AM Medical Record Number: [id number] Patient Account Number: [id number] Date of Birth/Sex: 03-29-1946 (71 y.o. Male) Treating RN: [name] Primary Care Provider: Oretha Milch Other Clinician: Referring Provider: Oretha Milch Treating Provider/Extender: Rudene Re in Treatment: 69 Active Problems ICD-10 Encounter Code Description Active Date Diagnosis E11.621 Type 2 diabetes mellitus with foot ulcer 01/01/2016 Yes L89.613 Pressure ulcer of right heel, stage 3 01/01/2016 Yes I89.0 Lymphedema, not elsewhere classified 01/01/2016 Yes E66.01 Morbid (severe) obesity due to excess calories 01/01/2016 Yes M70.871 Other soft tissue disorders related to use, overuse and 01/19/2016 Yes pressure, right ankle and foot 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/16/2016 Yes ulceration of heel and midfoot Inactive Problems Resolved Problems ICD-10 Code Description Active Date Resolved Date L89.322 Pressure ulcer of left buttock, stage 2 01/01/2016 01/01/2016 Electronic Signature(s) Signed: 01/27/2017 11:10:27 AM By: Evlyn Kanner MD, FACS Entered By: Evlyn Kanner on 01/27/2017 11:10:26 [name] ([id number]) [name], [name] ([id number]) -------------------------------------------------------------------------------- Progress Note Details Patient Name: [name],  [name] Date of Service: 01/27/2017 10:15 AM Medical Record Number: [id number] Patient Account Number: [id number] Date of Birth/Sex: 1946/07/28 (71 y.o.  Male) Treating RN: [name] Primary Care Provider: Oretha Milch Other Clinician: Referring Provider: Oretha Milch Treating Provider/Extender: Rudene Re in Treatment: 9 Subjective Chief Complaint Information obtained from Patient Mr. Esty presents for follow-up on his right heel pressure ulcer and bilateral lower extremity lymphedema and venous ulcerations. History of Present Illness (HPI) The following HPI elements were documented for the patient's wound: Location: ulcerated area on the right heel, left gluteal region and thigh and then bilateral lower extremities Quality: Patient reports experiencing a sharp pain to affected area(s). Severity: Patient states wound are getting worse. Duration: Patient has had the wound for > 12 months prior to seeking treatment at the wound center Timing: Pain in wound is constant (hurts all the time) Context: The wound appeared gradually over time Modifying Factors: Other treatment(s) tried include: he sees his heart doctor and his primary care doctor Associated Signs and Symptoms: patient has not been able to walk for over a year now 71 year old gentleman with a known history of hypertension, diabetes, obstructive sleep apnea, COPD, diastolic CHF, coronary artery disease was admitted to the hospital with sepsis from an ulcer of the right heel and was treated there in October 2016. he also has chronic bilateral lower extremity edema and lymphedema. He had received vancomycin, Zosyn and at that stage and x-rays showed hardware in the right ankle but no evidence of osteomyelitis. he was a former smoker. he was also treated with Augmentin orally for 10 days. He is either bedbound or wheelchair-bound and does not ambulate by himself. 01/19/2016 -- he has not been seen here for 3 weeks and this was because he was admitted to [facility] between 223 and 01/08/2016 for sepsis, UTI and pneumonia involving the left lung.  he was treated with IV vancomycin and Zosyn and then meropenem. Was discharged home on oral Bactrim for 2 weeks none of his vascular test or x-rays were done and we will reorder these. 01/26/2016 -- he has not yet done the x-ray of his foot and is vascular tests are still pending. I have asked him to work on these with his nursing home staff. Addendum: he has got an x-ray of the right foot done which shows that his osteopenia but no specific ostial lysis or abnormal periosteal reaction. Final impression was degenerative changes with dorsal foot soft tissue swelling. 02/16/2016 -- lower extremity arterial duplex examination shows a 50-99% stenosis of the right tibioperoneal trunk. He had biphasic flow in the right SFA, popliteal and tibioperoneal trunk. Left-sided he had triphasic flow throughout [place], [place] ([id number]) 02/29/2016 -- he is awaiting his vascular opinion with Dr. Gilda Crease which is to be done on April 24 03/08/2016 -- he has not kept his appointment with Dr. Gilda Crease on April 24 and does not seem to know what happened about this. it's difficult to gauge whether he is in full control of his mental faculties as at times he is extremely rude to the nursing staff. 03/22/2016 -- the patient was seen by Dr. 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 Recommended that the patient had severe atherosclerotic changes of both lower extremities associated with ulceration and tissue loss of the foot. This is a limb threatening ischemia and the patient was recommended to undergo angiography  of the lower extremity with a hope for intervention for limb salvage. Patient agreed and will proceed to angiography. He was admitted to the hospital between May 2 and 03/15/2016 - he underwent induction of a catheter into his right lower extremity and third order catheter placement with  contrast injection to the right lower extremity for distal runoff. Percutaneous transluminal angioplasty of the right superficial femoral and popliteal arteries were done. He also had a right peroneal angioplasty. Patient had a postoperative hematoma and was admitted for observation and in the next 24 hours he was very agitated and combative and had to be seen by psychiatric. He had a follow-up with Dr. Gilda Crease in 3 weeks. 04/18/2016 -- notes reviewed from the vascular office where he was seen for his postop visit after the PTA of the right SFA and popliteal arteries on 03/12/2016. He underwent an ABI which showed right ABI to be more than 1.3 and left to be more than 1 more than 1.3, great toe and PPG waveforms are decreased bilaterally. No additional intervention indicated at this time and the patient was to follow-up in 3 months with an ABI and bilateral lower extremity duplex study. 05/02/2016 -- he complains that his compression stockings are causing him a lot of discomfort and he is not happy wearing them. 05/30/2016 -- the swelling of both legs has increased again and he has had blisters which have opened out into ulcerations. 09/12/2016 -- the lymphedema pumps are still not delivered and hopefully will be done within this week. As far as his skin substitute goes we are still awaiting a response from his insurance 10-10-2016: Mr. Poffenberger presents today and states that his left edema pumps have arrived but the he has only received treatment "5 times" since arrival. He also denies using the offloading boot for the right heel but admits to having the boot. He states that staff is hesitant to assist and application of the lymphedema pumps. 10/17/2016 -- I understand he continues to take his diuretics and has been using his lymphedema pump once a day for about an hour. He cannot do it twice a day. 10-31-16 Mr. Meckel presents today for follow-up on his bilateral lower extremity venous ulcers  and his right posterior heel pressure ulcer. He states that he continues to do his lymphedema pumps once daily, unable to get assistance for twice daily. he does admit to increased drainage to the left leg more than the right leg. He denies any pain, fever, chills or overall ill feeling. 11/08/2016 -- the skin substitute which was run through his insurance company has a high copayment and he is not agreeable about getting this. 11/22/2016 -- he is still not able to use his lymphedema pumps as requested due to lack of help at his living facility. 12/23/2016 -- It was brought to my notice last week, that this patient has been extremely rude and insulting to my staff nurse and she was upset and was in tears. This is not the first time the patient has done this and Kocsis, [name] ([id number]) in the past over several weeks, I have addressed the situation, from time to time. I have personally been insulted by him with rude comments, but I have thought this may be due to an element of dementia. However at this stage, his management is beyond the scope of our practice and I will speak to his nursing supervisor and have them assessed by his PCP for behavioral disorders. We will give him noticed to  find a another wound center of his choice and will discharge him from our services. an Dance movement psychotherapist, under registered mail is going to be sent to him. However, I have personally discussed the aforementioned topic with him and he has had no commands or questions. 01/13/2017 -- since I saw the patient last 3 weeks ago his sister had come to inquire about him and we had a detailed discussion regarding his behavioral issues and the sister had all her questions answered. Eren is back today with the same dressing applied on 3 weeks ago and has been in a subdued mood. 01/27/2017 -- the patient is back here after 2 weeks and his dressing had not been changed for the 2 weeks he has not seen [place]. On removing his  dressing he had superficial blisters and ulceration both lower extremities. Objective Constitutional Pulse regular. Respirations normal and unlabored. Afebrile. Vitals Time Taken: 10:23 AM, Height: 70 in, Weight: 235 lbs, BMI: 33.7, Temperature: 98.2 F, Pulse: 69 bpm, Respiratory Rate: 18 breaths/min, Blood Pressure: 132/73 mmHg. Eyes Nonicteric. Reactive to light. Ears, Nose, Mouth, and Throat Lips, teeth, and gums WNL.Marland [name] Moist mucosa without lesions. Neck supple and nontender. No palpable supraclavicular or cervical adenopathy. Normal sized without goiter. Respiratory WNL. No retractions.. Breath sounds WNL, No rubs, rales, rhonchi, or wheeze.. Cardiovascular Heart rhythm and rate regular, no murmur or gallop.. Pedal Pulses WNL. No clubbing, cyanosis or edema. Lymphatic No adneopathy. No adenopathy. No adenopathy. Musculoskeletal Adexa without tenderness or enlargement.. Digits and nails w/o clubbing, cyanosis, infection, petechiae, ischemia, or inflammatory conditions.Marland [name] RONDALE, NIES ([id number]) Psychiatric Judgement and insight Intact.. No evidence of depression, anxiety, or agitation.. General Notes: the wound on the right medial calcaneus looks much cleaner and no sharp debridement was required today. He has got superficial ulcerations in both lower extremities in the lateral and medial calf areas and these did not need debridement. Integumentary (Hair, Skin) No suspicious lesions. No crepitus or fluctuance. No peri-wound warmth or erythema. No masses.. Wound #10 status is Open. Original cause of wound was Blister. The wound is located on the Right,Anterior Lower Leg. The wound measures 4cm length x 14cm width x 0.1cm depth; 43.982cm^2 area and 4.398cm^3 volume. The wound is limited to skin breakdown. There is no tunneling or undermining noted. There is a large amount of serosanguineous drainage noted. The wound margin is flat and intact. There is large (67-100%) red  granulation within the wound bed. There is a small (1-33%) amount of necrotic tissue within the wound bed including Adherent Slough. The periwound skin appearance did not exhibit: Callus, Crepitus, Excoriation, Induration, Rash, Scarring, Dry/Scaly, Maceration, Atrophie Blanche, Cyanosis, Ecchymosis, Hemosiderin Staining, Mottled, Pallor, Rubor, Erythema. Periwound temperature was noted as No Abnormality. The periwound has tenderness on palpation. Wound #11 status is Open. Original cause of wound was Trauma. The wound is located on the Left Toe Fifth. The wound measures 0.2cm length x 0.5cm width x 0.1cm depth; 0.079cm^2 area and 0.008cm^3 volume. The wound is limited to skin breakdown. There is no tunneling or undermining noted. There is a large amount of serous drainage noted. The wound margin is flat and intact. There is no granulation within the wound bed. There is no necrotic tissue within the wound bed. The periwound skin appearance exhibited: Erythema. The periwound skin appearance did not exhibit: Callus, Crepitus, Excoriation, Induration, Rash, Scarring, Dry/Scaly, Maceration, Atrophie Blanche, Cyanosis, Ecchymosis, Hemosiderin Staining, Mottled, Pallor, Rubor. The surrounding wound skin color is noted with erythema which is circumferential. Periwound temperature  was noted as No Abnormality. Wound #3 status is Open. Original cause of wound was Pressure Injury. The wound is located on the Right Calcaneus. The wound measures 0.3cm length x 0.6cm width x 0.1cm depth; 0.141cm^2 area and 0.014cm^3 volume. The wound is limited to skin breakdown. There is no tunneling or undermining noted. There is a large amount of serous drainage noted. The wound margin is flat and intact. There is medium (34-66%) pink granulation within the wound bed. There is a medium (34-66%) amount of necrotic tissue within the wound bed including Adherent Slough. The periwound skin appearance exhibited: Maceration. The  periwound skin appearance did not exhibit: Callus, Crepitus, Excoriation, Induration, Rash, Scarring, Dry/Scaly, Atrophie Blanche, Cyanosis, Ecchymosis, Hemosiderin Staining, Mottled, Pallor, Rubor, Erythema. The periwound has tenderness on palpation. Wound #9 status is Open. Original cause of wound was Gradually Appeared. The wound is located on the Left,Circumferential Lower Leg. The wound measures 2.5cm length x 4.5cm width x 0.1cm depth; 8.836cm^2 area and 0.884cm^3 volume. The wound is limited to skin breakdown. There is no tunneling or undermining noted. There is a large amount of serosanguineous drainage noted. The wound margin is flat and intact. There is large (67-100%) pink granulation within the wound bed. There is a small (1-33%) amount of necrotic tissue within the wound bed including Adherent Slough. The periwound skin appearance exhibited: Maceration, Mottled. The periwound skin appearance did not exhibit: Callus, Crepitus, Excoriation, Induration, Rash, Scarring, Dry/Scaly, Atrophie Blanche, Cyanosis, Ecchymosis, Hemosiderin Staining, Pallor, Rubor, Erythema. The periwound has tenderness on palpation. 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 - at clinic Cleanse wound with mild soap and water - at clinic No tub bath. - only sink bath or bed bath Wound #11 Left Toe Fifth: Clean wound with Normal Saline. - at clinic Cleanse wound with mild soap and water - at clinic No tub bath.  - only sink bath or bed bath Wound #3 Right Calcaneus: Clean wound with Normal Saline. - at clinic Cleanse wound with mild soap and water - at clinic No tub bath. - only sink bath or bed bath Wound #9 Left,Circumferential Lower Leg: Clean wound with Normal Saline. - at clinic Cleanse wound with mild soap and water - at clinic No tub bath. - only sink bath or bed bath Anesthetic: Wound #10 Right,Anterior Lower Leg: Topical Lidocaine 4% cream applied to wound bed prior to debridement - for clinic use only Wound #11 Left Toe Fifth: Topical Lidocaine 4% cream applied to wound bed prior to debridement - for clinic use only Wound #3 Right Calcaneus: Topical Lidocaine 4% cream applied to wound bed prior to debridement - for clinic use only Wound #9 Left,Circumferential Lower Leg: Topical Lidocaine 4% cream applied to wound bed prior to debridement - for clinic use only Skin Barriers/Peri-Wound Care: Wound #10 Right,Anterior Lower Leg: Antifungal cream - Nystatin Gripp, Avory [name]. ([id number]) Triamcinolone Acetonide Ointment Wound #11 Left Toe Fifth: Antifungal cream - Nystatin Triamcinolone Acetonide Ointment Wound #3 Right Calcaneus: Antifungal cream - Nystatin Triamcinolone Acetonide Ointment Wound #9 Left,Circumferential Lower  Leg: Antifungal cream - Nystatin Triamcinolone Acetonide Ointment Primary Wound Dressing: Wound #10 Right,Anterior Lower Leg: Aquacel Ag Wound #11 Left Toe Fifth: Aquacel Ag Wound #3 Right Calcaneus: Aquacel Ag Wound #9 Left,Circumferential Lower Leg: Aquacel Ag Secondary Dressing: Wound #10 Right,Anterior Lower Leg: ABD pad XtraSorb Wound #11 Left Toe Fifth: Other - coverlet Wound #3 Right Calcaneus: ABD pad XtraSorb Wound #9 Left,Circumferential Lower Leg: ABD pad XtraSorb Dressing Change Frequency: Wound #10 Right,Anterior Lower Leg: Change dressing every week Other: - SNF to change wrap weekly if patient does not come to his scheduled  wound care appointments Wound #11 Left Toe Fifth: Change dressing every other day. Change dressing every week Other: - SNF to change wrap weekly if patient does not come to his scheduled wound care appointments Wound #3 Right Calcaneus: Change dressing every week Other: - SNF to change wrap weekly if patient does not come to his scheduled wound care appointments Wound #9 Left,Circumferential Lower Leg: Change dressing every week Other: - SNF to change wrap weekly if patient does not come to his scheduled wound care appointments Follow-up Appointments: Wound #10 Right,Anterior Lower Leg: Return Appointment in 1 week. Wound #11 Left Toe Fifth: Return Appointment in 1 week. Wound #3 Right Calcaneus: [name], [name] ([id number]) Return Appointment in 1 week. Wound #9 Left,Circumferential Lower Leg: Return Appointment in 1 week. Edema Control: Wound #10 Right,Anterior Lower Leg: 3 Layer Compression System - Bilateral Compression Pump: Use compression pump on left lower extremity for 30 minutes, twice daily. Compression Pump: Use compression pump on right lower extremity for 30 minutes, twice daily. Other: - unna to anchor Wound #11 Left Toe Fifth: 3 Layer Compression System - Bilateral Compression Pump: Use compression pump on left lower extremity for 30 minutes, twice daily. Compression Pump: Use compression pump on right lower extremity for 30 minutes, twice daily. Other: - unna to anchor Wound #3 Right Calcaneus: 3 Layer Compression System - Bilateral Compression Pump: Use compression pump on left lower extremity for 30 minutes, twice daily. Compression Pump: Use compression pump on right lower extremity for 30 minutes, twice daily. Other: - unna to anchor Off-Loading: Wound #10 Right,Anterior Lower Leg: Turn and reposition every 2 hours Other: - sage boots at night and float heel when lying in bed *******lymphedema pumps to be placed on pt twice a day for one hour  sessions******** Wound #11 Left Toe Fifth: Turn and reposition every 2 hours Other: - sage boots at night and float heel when lying in bed *******lymphedema pumps to be placed on pt twice a day for one hour sessions******** Wound #3 Right Calcaneus: Turn and reposition every 2 hours Other: - sage boots at night and float heel when lying in bed *******lymphedema pumps to be placed on pt twice a day for one hour sessions******** Wound #9 Left,Circumferential Lower Leg: Turn and reposition every 2 hours Other: - sage boots at night and float heel when lying in bed *******lymphedema pumps to be placed on pt twice a day for one hour sessions******** Additional Orders / Instructions: Wound #10 Right,Anterior Lower Leg: Increase protein intake. Wound #11 Left Toe Fifth: Increase protein intake. Wound #3 Right Calcaneus: Increase protein intake. Wound #9 Left,Circumferential Lower Leg: Increase protein intake. Medications-please add to medication list.: Wound #10 Right,Anterior Lower Leg: Other: - Vitamin C, Vitamin A, Zinc, multivitamin Wound #11 Left Toe Fifth: Other: - Vitamin C, Vitamin A, Zinc, multivitamin Wound #3 Right Calcaneus: [name], [name] ([id number]) Other: - Vitamin C, Vitamin A, Zinc, multivitamin  Wound #9 Left,Circumferential Lower Leg: Other: - Vitamin C, Vitamin A, Zinc, multivitamin Discharge From [facility]: Wound #10 Right,Anterior Lower Leg: Discharge from Wound Care Center Wound #11 Left Toe Fifth: Discharge from Wound Care Center Wound #3 Right Calcaneus: Discharge from Wound Care Center Wound #9 Left,Circumferential Lower Leg: Discharge from Wound Care Center General Notes: Leg wraps need to be changed on a weekly basis. Please see that patient is seen by an appropriate Wound Care Center so that wraps and wounds can be cared for appropriately I have recommended : 1. using Aquacell ag and Drawtex for his right heel to be changed every week. we'll also  put a small piece of silver alginate on his left fifth toe where the nail was just removed 2. We will continue with a nonadherent layer, silver alginate and a 3 layer Profore. 3. he has been discharged from our service due to his behavioral issues with our nursing staff and it has been a month since noticed has been given in a formal letter of discharge. 4. I have asked him to contact our office, or as his sister who is his caregiver to speak with [place], in case they have any questions. Electronic Signature(s) Signed: 01/27/2017 11:14:56 AM By: Evlyn Kanner MD, FACS Entered By: Evlyn Kanner on 01/27/2017 11:14:56 [name] ([id number]) -------------------------------------------------------------------------------- SuperBill Details Patient Name: [name] Date of Service: 01/27/2017 Medical Record Number: [id number] Patient Account Number: [id number] Date of Birth/Sex: 01/26/1946 (71 y.o. 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 Physician Procedures CPT4: Description Modifier Quantity Code [id number] - WC PHYS LEVEL 3 - EST PT 1 ICD-10 Description Diagnosis E11.621 Type 2 diabetes mellitus with foot  ulcer L89.613 Pressure ulcer of right heel, stage 3 I89.0 Lymphedema, not elsewhere classified  M70.871 Other soft tissue disorders related to use, overuse and pressure, right ankle and foot Electronic Signature(s) Signed: 01/27/2017 2:04:29 PM By: [name] Signed: 01/27/2017 4:53:16 PM By: Evlyn Kanner MD, FACS Previous Signature: 01/27/2017 11:15:16 AM Version By: Evlyn Kanner MD, FACS Entered By: [name] on 01/27/2017 14:04:24

## 2017-01-30 ENCOUNTER — Encounter (INDEPENDENT_AMBULATORY_CARE_PROVIDER_SITE_OTHER): Payer: Self-pay | Admitting: Vascular Surgery

## 2017-01-30 ENCOUNTER — Encounter (INDEPENDENT_AMBULATORY_CARE_PROVIDER_SITE_OTHER): Payer: Self-pay

## 2017-01-30 ENCOUNTER — Ambulatory Visit (INDEPENDENT_AMBULATORY_CARE_PROVIDER_SITE_OTHER): Payer: Medicare Other | Admitting: Vascular Surgery

## 2017-01-30 VITALS — BP 152/90 | HR 79 | Resp 16 | Ht 72.0 in

## 2017-01-30 DIAGNOSIS — I7025 Atherosclerosis of native arteries of other extremities with ulceration: Secondary | ICD-10-CM

## 2017-01-30 DIAGNOSIS — N289 Disorder of kidney and ureter, unspecified: Secondary | ICD-10-CM

## 2017-01-30 DIAGNOSIS — L89614 Pressure ulcer of right heel, stage 4: Secondary | ICD-10-CM | POA: Diagnosis not present

## 2017-02-02 DIAGNOSIS — I7025 Atherosclerosis of native arteries of other extremities with ulceration: Secondary | ICD-10-CM | POA: Insufficient documentation

## 2017-02-02 NOTE — Progress Notes (Addendum)
MRN : [id number]  [name] is a 71 y.o. (Nov 29, 1945) male who presents with chief complaint of  Chief Complaint  Patient presents with  . Follow-up  .  History of Present Illness: The patient returns to the office for followup and review of the noninvasive studies. There has been a significant deterioration in the lower extremity symptoms.  The patient notes interval shortening of their claudication distance and development of mild rest pain symptoms. He has nonhealing ulcers bilaterally since the last visit.  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 He has not had routine follow up  The patient denies amaurosis fugax or recent TIA symptoms. There are no recent neurological changes noted. The patient denies history of DVT, PE or superficial thrombophlebitis. The patient denies recent episodes of angina or shortness of breath.    Current Meds  Medication Sig  . acetaminophen (TYLENOL) 500 MG tablet Take 500 mg by mouth every 6 (six) hours as needed for mild pain or moderate pain.  Marland Kitchen allopurinol (ZYLOPRIM) 100 MG tablet Take 100 mg by mouth at bedtime.  Marland Kitchen ascorbic acid (VITAMIN C) 500 MG tablet Take 500 mg by mouth daily.  . Cholecalciferol (VITAMIN D3) 5000 units TABS Take 1 tablet by mouth daily.  . cholestyramine (QUESTRAN) 4 g packet Take 4 g by mouth as needed.  Marland Kitchen dextromethorphan (DELSYM) 30 MG/5ML liquid Take 60 mg by mouth every 12 (twelve) hours as needed for cough.  . divalproex (DEPAKOTE) 250 MG DR tablet Take 250 mg by mouth 2 (two) times daily.  . ferrous sulfate 325 (65 FE) MG tablet Take 325 mg by mouth daily with breakfast.  . finasteride (PROSCAR) 5 MG tablet Take 1 tablet (5 mg total) by mouth daily.  . fluconazole (DIFLUCAN) 150 MG tablet Take 150 mg by mouth once a week. Pt takes on Thursday.  . fluticasone furoate-vilanterol (BREO ELLIPTA) 100-25 MCG/INH AEPB Inhale 1 puff into the lungs daily.  . furosemide (LASIX) 20 MG  tablet Take 60 mg by mouth 2 (two) times daily.  Marland Kitchen gabapentin (NEURONTIN) 400 MG capsule Take 400 mg by mouth 3 (three) times daily.  . Hydroactive Dressings (DUODERM HYDROACTIVE) GEL Apply topically.  . insulin lispro (HUMALOG) 100 UNIT/ML injection Inject 5 Units into the skin 3 (three) times daily before meals.  Marland Kitchen ipratropium-albuterol (DUONEB) 0.5-2.5 (3) MG/3ML SOLN Take 3 mLs by nebulization every 4 (four) hours as needed.   . loperamide (IMODIUM) 2 MG capsule Take 2 mg by mouth every 4 (four) hours as needed for diarrhea or loose stools.  . magnesium hydroxide (MILK OF MAGNESIA) 400 MG/5ML suspension Take 30 mLs by mouth daily as needed for mild constipation.  . Menthol, Topical Analgesic, (BIOFREEZE EX) Apply 1 application topically every 12 (twelve) hours as needed (for pain).   . metoprolol (LOPRESSOR) 50 MG tablet Take 50 mg by mouth daily.  . Multiple Vitamin (MULTIVITAMIN WITH MINERALS) TABS tablet Take 1 tablet by mouth daily.  Marland Kitchen nystatin (MYCOSTATIN/NYSTOP) 100000 UNIT/GM POWD Apply topically 3 (three) times daily.  Marland Kitchen omeprazole (PRILOSEC) 20 MG capsule Take 20 mg by mouth daily.   Marland Kitchen oxyCODONE (OXY IR/ROXICODONE) 5 MG immediate release tablet Take 1 tablet (5 mg total) by mouth every 6 (six) hours as needed for severe pain.  . Probiotic Product (ALIGN) 4 MG CAPS Take by mouth daily.  . Salicylic Acid (SELSUN BLUE NATURALS) 3 % SHAM Apply 1 application topically once a week.  Marland Kitchen  sertraline (ZOLOFT) 100 MG tablet Take 100 mg by mouth daily.   Marland Kitchen Silver (AQUACEL AG FOAM) 4"X4" PADS Apply 1 application topically daily.  . tamsulosin (FLOMAX) 0.4 MG CAPS capsule Take 0.4 mg by mouth daily.  . traZODone (DESYREL) 50 MG tablet Take 50 mg by mouth at bedtime.    Past Medical History:  Diagnosis Date  . Anxiety disorder   . 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 Bacteremia   . CHF (congestive heart failure) (HCC)    diastolic dysfunction,  last ECHO from 2015, EF 50-55%  . Chronic respiratory failure (HCC)    on 2l oxygen continuous  . CKD (chronic kidney disease)   . COPD (chronic obstructive pulmonary disease) (HCC)   . Diabetes mellitus (HCC)   . Difficulty in walking    bedbound  . GERD (gastroesophageal reflux disease)   . Gout   . Heel ulcer (HCC)   . Hematuria   . Hyperlipemia   . Hypertension   . Lymphedema   . Morbid obesity (HCC)   . Muscle weakness   . Sleep apnea    on CPAP  . Urinary incontinence   . Urinary retention     Past Surgical History:  Procedure Laterality Date  . APPENDECTOMY    . CORONARY ANGIOPLASTY WITH STENT PLACEMENT    . CORONARY ARTERY BYPASS GRAFT    . HERNIA REPAIR     hiatal hernia repair  . PERIPHERAL VASCULAR CATHETERIZATION Right 03/12/2016   Procedure: Lower Extremity Angiography;  Surgeon: Renford Dills, MD;  Location: [facility] INVASIVE CV [facility];  Service: Cardiovascular;  Laterality: Right;  . PERIPHERAL VASCULAR CATHETERIZATION  03/12/2016   Procedure: Lower Extremity Intervention;  Surgeon: Renford Dills, MD;  Location: [facility] INVASIVE CV [facility];  Service: Cardiovascular;;    Social History Social History  Substance Use Topics  . Smoking status: Former Games developer  . Smokeless tobacco: Former Neurosurgeon  . Alcohol use No    Family History Family History  Problem Relation Age of Onset  . Alzheimer's disease Mother   . Heart attack Father   . Kidney disease Neg Hx   . Prostate cancer Neg Hx   No family history of bleeding/clotting disorders, porphyria or autoimmune disease   Allergies  Allergen Reactions  . Tomato Swelling     REVIEW OF SYSTEMS (Negative unless checked)  Constitutional: [] Weight loss  [] Fever  [] Chills Cardiac: [] Chest pain   [] Chest pressure   [] Palpitations   [] Shortness of breath when laying flat   [] Shortness of breath with exertion. Vascular:  [] Pain in legs with walking   [x] Pain in legs at rest  [] History of DVT   [] Phlebitis   [x] Swelling in  legs   [] Varicose veins   [x] Non-healing ulcers Pulmonary:   [] Uses home oxygen   [] Productive cough   [] Hemoptysis   [] Wheeze  [] COPD   [] Asthma Neurologic:  [] Dizziness   [] Seizures   [] History of stroke   [] History of TIA  [] Aphasia   [] Vissual changes   [] Weakness or numbness in arm   [] Weakness or numbness in leg Musculoskeletal:   [] Joint swelling   [] Joint pain   [] Low back pain Hematologic:  [] Easy bruising  [] Easy bleeding   [] Hypercoagulable state   [] Anemic Gastrointestinal:  [] Diarrhea   [] Vomiting  [] Gastroesophageal reflux/heartburn   [] Difficulty swallowing. Genitourinary:  [x] Chronic kidney disease   [] Difficult urination  [] Frequent urination   [] Blood in urine Skin:  [] Rashes   [x] Ulcers  Psychological:  [] History of anxiety   []  History of major depression.  Physical Examination  Vitals:   01/30/17 1336  BP: (!) 152/90  Pulse: 79  Resp: 16  Height: 6' (1.829 m)   There is no height or weight on file to calculate BMI. Gen: WD/WN, NAD Head: [place]/AT, No temporalis wasting.  Ear/Nose/Throat: Hearing grossly intact, nares w/o erythema or drainage, poor dentition Eyes: PER, EOMI, sclera nonicteric.  Neck: Supple, no masses.  No bruit or JVD.  Pulmonary:  Good air movement, clear to auscultation bilaterally, no use of accessory muscles.  Cardiac: RRR, normal S1, S2, no Murmurs. Vascular: 2-3+ edema bilaterally with severe venous changes bilaterally.  Venous ulcers noted in the ankle area bilaterally, noninfected.  Right heel ulcer noted Vessel Right Left  Radial Palpable Palpable  Ulnar Palpable Palpable  Brachial Palpable Palpable  Carotid Palpable Palpable  Femoral Palpable Palpable  Popliteal Not Palpable Not Palpable  PT Not Palpable Not Palpable  DP Not Palpable Not Palpable   Gastrointestinal: soft, non-distended. No guarding/no peritoneal signs.  Musculoskeletal: M/S 5/5 throughout.  No deformity or atrophy.  Neurologic: CN 2-12 intact. Pain and light touch  intact in extremities.  Symmetrical.  Speech is fluent. Motor exam as listed above. Psychiatric: Judgment intact, Mood & affect appropriate for pt's clinical situation. Dermatologic: Venous stasis dermatitis with ulcers present.  No changes consistent with cellulitis. Lymph : No Cervical lymphadenopathy, no lichenification or skin changes of chronic lymphedema.  CBC Lab Results  Component Value Date   WBC 3.4 (L) 03/15/2016   HGB 9.2 (L) 03/15/2016   HCT 27.7 (L) 03/15/2016   MCV 89.3 03/15/2016   PLT 105 (L) 03/15/2016    BMET    Component Value Date/Time   NA 141 03/15/2016 0410   NA 144 11/26/2014 0459   K 3.7 03/15/2016 0410   K 3.8 11/26/2014 0459   CL 104 03/15/2016 0410   CL 106 11/26/2014 0459   CO2 29 03/15/2016 0410   CO2 33 (H) 11/26/2014 0459   GLUCOSE 129 (H) 03/15/2016 0410   GLUCOSE 108 (H) 11/26/2014 0459   BUN 51 (H) 03/15/2016 0410   BUN 13 11/26/2014 0459   CREATININE 1.88 (H) 03/15/2016 0410   CREATININE 1.08 11/26/2014 0459   CALCIUM 8.9 03/15/2016 0410   CALCIUM 8.6 11/26/2014 0459   GFRNONAA 34 (L) 03/15/2016 0410   GFRNONAA >60 11/26/2014 0459   GFRAA 40 (L) 03/15/2016 0410   GFRAA >60 11/26/2014 0459   CrCl cannot be calculated (Patient's most recent lab result is older than the maximum 21 days allowed.).  COAG Lab Results  Component Value Date   INR 1.2 11/23/2014   INR 1.2 11/12/2014   INR 1.2 10/11/2014    Radiology No results found.  Assessment/Plan 1. Atherosclerosis of native arteries of the extremities with ulceration (HCC)  Recommend:  The patient has evidence of severe atherosclerotic changes of both lower extremities associated with ulceration and tissue loss of the foot.  This represents a limb threatening ischemia and places the patient at the risk for limb loss.  Patient should undergo angiography of the lower extremities with the hope for intervention for limb salvage.  The risks and benefits as well as the alternative  therapies was discussed in detail with the patient.  All questions were answered.  Patient agrees to proceed with angiography.  The patient will follow up with me in the office after the procedure.   The patient was offered tomorrow Friday 23,2018  but declined and asked for later in April  2. 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 Renal insufficiency Continue antihypertensive medications as already ordered, these medications have been reviewed and there are no changes at this time.  Avoid dehydration and minimize contrast with the up coming angiogram    Levora Dredge, MD  02/02/2017 11:51 AM

## 2017-02-07 ENCOUNTER — Other Ambulatory Visit (INDEPENDENT_AMBULATORY_CARE_PROVIDER_SITE_OTHER): Payer: Self-pay | Admitting: Vascular Surgery

## 2017-02-20 ENCOUNTER — Telehealth (INDEPENDENT_AMBULATORY_CARE_PROVIDER_SITE_OTHER): Payer: Self-pay

## 2017-02-20 NOTE — Telephone Encounter (Signed)
Yesterday I received a call from Tasha at [facility] care regarding patient. She stated to cancel his angiogram and that he would not be coming back, she gave no reason. I called back and left a message wanting to know why he was not coming back because the patient does have an ulcer and has problems with his leg. I didn't receive a callback. I call again today an asked to speak with an administrator regarding the patient and spoke with the [facility] I expressed my concern for the patient because of his ulcer, I  was told that the patient had a UTI and was talking out of his head but that she would check into why his angiogram was canceled and call me back.

## 2017-02-21 ENCOUNTER — Telehealth (INDEPENDENT_AMBULATORY_CARE_PROVIDER_SITE_OTHER): Payer: Self-pay

## 2017-02-21 NOTE — Telephone Encounter (Signed)
Rhonda from [facility] care left a message stating that the patient's family no longer wants vascular intervention for him because he may be going into hospice.

## 2017-02-24 ENCOUNTER — Inpatient Hospital Stay: Admission: RE | Admit: 2017-02-24 | Payer: Medicare Other | Source: Ambulatory Visit

## 2017-02-25 ENCOUNTER — Encounter: Admission: RE | Payer: Self-pay | Source: Ambulatory Visit

## 2017-02-25 ENCOUNTER — Ambulatory Visit: Admission: RE | Admit: 2017-02-25 | Payer: Medicare Other | Source: Ambulatory Visit | Admitting: Vascular Surgery

## 2017-02-25 SURGERY — LOWER EXTREMITY ANGIOGRAPHY
Anesthesia: Moderate Sedation | Laterality: Right

## 2017-07-12 DEATH — deceased
